# Patient Record
Sex: Male | Born: 1960 | Hispanic: No | Marital: Married | Smoking: Never smoker
Health system: Southern US, Community
[De-identification: ages and names within clinical notes are randomized; demographics above are authoritative.]

## PROBLEM LIST (undated history)

## (undated) DIAGNOSIS — H269 Unspecified cataract: Secondary | ICD-10-CM

## (undated) DIAGNOSIS — H35039 Hypertensive retinopathy, unspecified eye: Secondary | ICD-10-CM

## (undated) DIAGNOSIS — E11319 Type 2 diabetes mellitus with unspecified diabetic retinopathy without macular edema: Secondary | ICD-10-CM

## (undated) DIAGNOSIS — E119 Type 2 diabetes mellitus without complications: Secondary | ICD-10-CM

## (undated) DIAGNOSIS — I639 Cerebral infarction, unspecified: Secondary | ICD-10-CM

## (undated) HISTORY — DX: Type 2 diabetes mellitus with unspecified diabetic retinopathy without macular edema: E11.319

## (undated) HISTORY — DX: Hypertensive retinopathy, unspecified eye: H35.039

## (undated) HISTORY — DX: Cerebral infarction, unspecified: I63.9

## (undated) HISTORY — DX: Unspecified cataract: H26.9

---

## 2008-06-13 ENCOUNTER — Ambulatory Visit (HOSPITAL_COMMUNITY): Admission: RE | Admit: 2008-06-13 | Discharge: 2008-06-13 | Payer: Self-pay | Admitting: Family Medicine

## 2011-08-19 ENCOUNTER — Ambulatory Visit (HOSPITAL_COMMUNITY)
Admission: RE | Admit: 2011-08-19 | Discharge: 2011-08-19 | Disposition: A | Discharge status: Home / Self Care | Payer: 59 | Source: Ambulatory Visit | Attending: Family Medicine | Admitting: Family Medicine

## 2011-08-19 ENCOUNTER — Other Ambulatory Visit (HOSPITAL_COMMUNITY): Payer: Self-pay | Admitting: Family Medicine

## 2011-08-19 DIAGNOSIS — M25579 Pain in unspecified ankle and joints of unspecified foot: Secondary | ICD-10-CM | POA: Insufficient documentation

## 2011-08-19 DIAGNOSIS — M79673 Pain in unspecified foot: Secondary | ICD-10-CM

## 2011-08-19 DIAGNOSIS — M7730 Calcaneal spur, unspecified foot: Secondary | ICD-10-CM | POA: Insufficient documentation

## 2018-01-20 DIAGNOSIS — J069 Acute upper respiratory infection, unspecified: Secondary | ICD-10-CM | POA: Diagnosis not present

## 2018-02-26 DIAGNOSIS — L0292 Furuncle, unspecified: Secondary | ICD-10-CM | POA: Diagnosis not present

## 2018-02-26 DIAGNOSIS — Z6829 Body mass index (BMI) 29.0-29.9, adult: Secondary | ICD-10-CM | POA: Diagnosis not present

## 2018-07-12 DIAGNOSIS — B353 Tinea pedis: Secondary | ICD-10-CM | POA: Diagnosis not present

## 2018-07-12 DIAGNOSIS — M79672 Pain in left foot: Secondary | ICD-10-CM | POA: Diagnosis not present

## 2018-07-12 DIAGNOSIS — E1140 Type 2 diabetes mellitus with diabetic neuropathy, unspecified: Secondary | ICD-10-CM | POA: Diagnosis not present

## 2018-07-12 DIAGNOSIS — M79671 Pain in right foot: Secondary | ICD-10-CM | POA: Diagnosis not present

## 2018-08-07 DIAGNOSIS — I10 Essential (primary) hypertension: Secondary | ICD-10-CM | POA: Diagnosis not present

## 2018-08-07 DIAGNOSIS — R7301 Impaired fasting glucose: Secondary | ICD-10-CM | POA: Diagnosis not present

## 2018-08-10 DIAGNOSIS — E782 Mixed hyperlipidemia: Secondary | ICD-10-CM | POA: Diagnosis not present

## 2018-08-10 DIAGNOSIS — E1165 Type 2 diabetes mellitus with hyperglycemia: Secondary | ICD-10-CM | POA: Diagnosis not present

## 2018-08-10 DIAGNOSIS — I10 Essential (primary) hypertension: Secondary | ICD-10-CM | POA: Diagnosis not present

## 2018-08-10 DIAGNOSIS — J309 Allergic rhinitis, unspecified: Secondary | ICD-10-CM | POA: Diagnosis not present

## 2018-09-07 DIAGNOSIS — H9319 Tinnitus, unspecified ear: Secondary | ICD-10-CM | POA: Diagnosis not present

## 2018-09-07 DIAGNOSIS — R05 Cough: Secondary | ICD-10-CM | POA: Diagnosis not present

## 2018-09-07 DIAGNOSIS — R1013 Epigastric pain: Secondary | ICD-10-CM | POA: Diagnosis not present

## 2018-09-07 DIAGNOSIS — E1165 Type 2 diabetes mellitus with hyperglycemia: Secondary | ICD-10-CM | POA: Diagnosis not present

## 2018-09-25 DIAGNOSIS — H6593 Unspecified nonsuppurative otitis media, bilateral: Secondary | ICD-10-CM | POA: Diagnosis not present

## 2018-09-25 DIAGNOSIS — G720 Drug-induced myopathy: Secondary | ICD-10-CM | POA: Diagnosis not present

## 2018-09-25 DIAGNOSIS — J309 Allergic rhinitis, unspecified: Secondary | ICD-10-CM | POA: Diagnosis not present

## 2018-09-25 DIAGNOSIS — Z6831 Body mass index (BMI) 31.0-31.9, adult: Secondary | ICD-10-CM | POA: Diagnosis not present

## 2018-10-03 DIAGNOSIS — M79671 Pain in right foot: Secondary | ICD-10-CM | POA: Diagnosis not present

## 2018-10-03 DIAGNOSIS — B351 Tinea unguium: Secondary | ICD-10-CM | POA: Diagnosis not present

## 2018-10-03 DIAGNOSIS — M79672 Pain in left foot: Secondary | ICD-10-CM | POA: Diagnosis not present

## 2018-10-20 DIAGNOSIS — R1013 Epigastric pain: Secondary | ICD-10-CM | POA: Diagnosis not present

## 2018-10-20 DIAGNOSIS — J069 Acute upper respiratory infection, unspecified: Secondary | ICD-10-CM | POA: Diagnosis not present

## 2018-10-20 DIAGNOSIS — L0292 Furuncle, unspecified: Secondary | ICD-10-CM | POA: Diagnosis not present

## 2018-10-20 DIAGNOSIS — J060 Acute laryngopharyngitis: Secondary | ICD-10-CM | POA: Diagnosis not present

## 2018-10-20 DIAGNOSIS — Z6829 Body mass index (BMI) 29.0-29.9, adult: Secondary | ICD-10-CM | POA: Diagnosis not present

## 2018-12-12 DIAGNOSIS — M79671 Pain in right foot: Secondary | ICD-10-CM | POA: Diagnosis not present

## 2018-12-12 DIAGNOSIS — M79672 Pain in left foot: Secondary | ICD-10-CM | POA: Diagnosis not present

## 2018-12-12 DIAGNOSIS — E1140 Type 2 diabetes mellitus with diabetic neuropathy, unspecified: Secondary | ICD-10-CM | POA: Diagnosis not present

## 2019-02-05 DIAGNOSIS — E119 Type 2 diabetes mellitus without complications: Secondary | ICD-10-CM | POA: Diagnosis not present

## 2019-02-05 DIAGNOSIS — H5202 Hypermetropia, left eye: Secondary | ICD-10-CM | POA: Diagnosis not present

## 2019-02-05 DIAGNOSIS — E113293 Type 2 diabetes mellitus with mild nonproliferative diabetic retinopathy without macular edema, bilateral: Secondary | ICD-10-CM | POA: Diagnosis not present

## 2019-02-05 DIAGNOSIS — H524 Presbyopia: Secondary | ICD-10-CM | POA: Diagnosis not present

## 2019-02-11 NOTE — Progress Notes (Addendum)
[facility] - Clinic Note  02/13/2019     CHIEF COMPLAINT Patient presents for Diabetic Eye Exam   HISTORY OF PRESENT ILLNESS: Brad Delacruz is a 58 y.o. male who presents to the clinic today for:   HPI    Diabetic Eye Exam    Vision is stable.  Associated Symptoms Floaters.  Negative for Flashes, Blind Spot, Photophobia, Scalp Tenderness, Fever, Pain, Glare, Jaw Claudication, Weight Loss, Distortion, Redness, Trauma, Shoulder/Hip pain and Fatigue.  Diabetes characteristics include Type 2.  This started 17.  Blood sugar level is uncontrolled.  Last Blood Glucose: Doesn't check.  Last A1C 9 (6 months ago).  Associated Diagnosis Neuropathy (Numbness in feet).  I, the attending physician,  performed the HPI with the patient and updated documentation appropriately.          Comments    Patient went to eye doctor for routine follow up and referred here for diabetic eye exam. Last A1c that patient remembers was around 9 or over. This was checked about 6 months ago. Patient does not check blood sugars at home.        Last edited by Rennis Chris, MD on 02/13/2019  9:35 AM. (History)    pt states he was sent here by Dr. Charise Killian for DM exam who he saw for routine eye exam, pt states he does not have any problems with his vision, he states he only wears reading glasses, pt states he takes metformin and farxiga for diabetes, he states his last A1C was over 9, and he does not check his blood sugar at home, pt states he also takes medication for high BP, pt denies any other health problems  Referring physician: Daisy Lazar, DO [address] [place], [place] [address]  HISTORICAL INFORMATION:   Selected notes from the MEDICAL RECORD NUMBER Referred by Dr. Daisy Lazar for concern of NPDR OU LEE: 03.03.20 Fabienne Bruns) [BCVA: OU: 20/20-] Ocular Hx-NPDR OU PMH-DM (takes Farxiga/metformin)    CURRENT MEDICATIONS: No current outpatient medications on file. (Ophthalmic Drugs)    No current facility-administered medications for this visit.  (Ophthalmic Drugs)   Current Outpatient Medications (Other)  Medication Sig  . ciclopirox (PENLAC) 8 % solution Apply topically as directed.  . dapagliflozin propanediol (FARXIGA) 10 MG TABS tablet Take 10 mg by mouth daily.  Marland Kitchen losartan (COZAAR) 25 MG tablet Take 25 mg by mouth daily.  . pravastatin (PRAVACHOL) 10 MG tablet Take 10 mg by mouth daily.  Marland Kitchen XIGDUO XR 09-999 MG TB24 Take 1 tablet by mouth daily.   No current facility-administered medications for this visit.  (Other)      REVIEW OF SYSTEMS: ROS    Positive for: Musculoskeletal, Endocrine, Eyes   Negative for: Constitutional, Gastrointestinal, Neurological, Skin, Genitourinary, HENT, Cardiovascular, Respiratory, Psychiatric, Allergic/Imm, Heme/Lymph   Last edited by Annalee Genta D on 02/13/2019  8:48 AM. (History)       ALLERGIES No Known Allergies  PAST MEDICAL HISTORY History reviewed. No pertinent past medical history. History reviewed. No pertinent surgical history.  FAMILY HISTORY History reviewed. No pertinent family history.  SOCIAL HISTORY Social History   Tobacco Use  . Smoking status: Never Smoker  . Smokeless tobacco: Never Used  Substance Use Topics  . Alcohol use: Not Currently    Frequency: Never  . Drug use: Never         OPHTHALMIC EXAM:  Base Eye Exam    Visual Acuity (Snellen - Linear)  Right Left   Dist [place] 20/20 20/20       Tonometry (Tonopen, 9:02 AM)      Right Left   Pressure 22 20       Pupils      Dark Light Shape React APD   Right 4 3 Round Sluggish None   Left 4 3 Round Sluggish None       Visual Fields (Counting fingers)      Left Right    Full Full       Extraocular Movement      Right Left    Full, Ortho Full, Ortho       Neuro/Psych    Oriented x3:  Yes   Mood/Affect:  Normal       Dilation    Both eyes:  1.0% Mydriacyl, 2.5% Phenylephrine @ 9:02 AM        Slit Lamp and  Fundus Exam    Slit Lamp Exam      Right Left   Lids/Lashes Dermatochalasis - upper lid Dermatochalasis - upper lid, mild Meibomian gland dysfunction   Conjunctiva/Sclera White and quiet mild nasal and temporal Pinguecula   Cornea 1+ Punctate epithelial erosions, Debris in tear film 1+ Punctate epithelial erosions, Debris in tear film   Anterior Chamber deep, narrow temporal angle deep and clear   Iris Round and well dilated, No NVI Round and well dilated, No NVI   Lens 2+ Nuclear sclerosis, 2+ Cortical cataract 2+ Nuclear sclerosis, 2+ Cortical cataract   Vitreous Vitreous syneresis mild Vitreous syneresis       Fundus Exam      Right Left   Disc Pink and Sharp Pink and Sharp   C/D Ratio 0.5 0.55   Macula Blunted foveal reflex, scattered IRH, small cluster of exudate inferior fovea, CWS inferior macular, cystic changes Blunted foveal reflex, scattered MA/IRF mostly superior macula, Cystic changes SN to fovea   Vessels Vascular attenuation, mild AV crossing changes Vascular attenuation, mild AV crossing changes   Periphery Attached, scattered MA Attached, rare MA        Refraction    Manifest Refraction      Sphere Cylinder Axis Dist VA   Right -0.25 Sphere  20/20-1   Left +0.00 +0.75 167 20/20          IMAGING AND PROCEDURES  Imaging and Procedures for @TODAY @  OCT, Retina - OU - Both Eyes       Right Eye Quality was good. Central Foveal Thickness: 327. Progression has no prior data. Findings include normal foveal contour, no SRF, intraretinal fluid, intraretinal hyper-reflective material.   Left Eye Quality was good. Central Foveal Thickness: 325. Progression has no prior data. Findings include normal foveal contour, no SRF, intraretinal fluid, intraretinal hyper-reflective material.   Notes *Images captured and stored on drive  Diagnosis / Impression:  OD: NFP; mild cystic changes and  IRHM OS: NFP: +IRF/DME sup nasal macula  Clinical management:  See  below  Abbreviations: NFP - Normal foveal profile. CME - cystoid macular edema. PED - pigment epithelial detachment. IRF - intraretinal fluid. SRF - subretinal fluid. EZ - ellipsoid zone. ERM - epiretinal membrane. ORA - outer retinal atrophy. ORT - outer retinal tubulation. SRHM - subretinal hyper-reflective material                 ASSESSMENT/PLAN:    ICD-10-CM   1. Moderate nonproliferative diabetic retinopathy of both eyes with macular edema associated with type 2 diabetes mellitus (HCC) S31.5945  2. Retinal edema H35.81 OCT, Retina - OU - Both Eyes  3. Essential hypertension I10   4. Hypertensive retinopathy of both eyes H35.033   5. Combined forms of age-related cataract of both eyes H25.813     1,2. Moderate nonproliferative diabetic retinopathy with DME OU  - The incidence, risk factors for progression, natural history and treatment options for diabetic retinopathy were discussed with patient.    - The need for close monitoring of blood glucose, blood pressure, and serum lipids, avoiding cigarette or any type of tobacco, and the need for long term follow up was also discussed with patient.  - BCVA 20/20 OU  - exam shows scattered IRH  - OCT with noncentral diabetic macular edema, OU   - f/u in 6 weeks, DFE/OCT/Optos FA  3,4. Hypertensive retinopathy OU  - discussed importance of tight BP control  - monitor  5. Mixed form age related cataract  - The symptoms of cataract, surgical options, and treatments and risks were discussed with patient.  - discussed diagnosis and progression  - not yet visually significant  - monitor for now   Ophthalmic Meds Ordered this visit:  No orders of the defined types were placed in this encounter.      Return in about 6 weeks (around 03/27/2019) for f/u NPDR OU, DFE, OCT, FA.  There are no Patient Instructions on file for this visit.   Explained the diagnoses, plan, and follow up with the patient and they expressed  understanding.  Patient expressed understanding of the importance of proper follow up care.   This document serves as a record of services personally performed by Karie Chimera, MD, PhD. It was created on their behalf by Laurian Brim, OA, an ophthalmic assistant. The creation of this record is the provider's dictation and/or activities during the visit.    Electronically signed by: Laurian Brim, OA  03.09.2020 10:54 AM    Karie Chimera, M.D., Ph.D. Diseases & Surgery of the Retina and Vitreous [facility]   I have reviewed the above documentation for accuracy and completeness, and I agree with the above. Karie Chimera, M.D., Ph.D. 02/16/19 10:54 AM    Abbreviations: M myopia (nearsighted); A astigmatism; H hyperopia (farsighted); P presbyopia; Mrx spectacle prescription;  CTL contact lenses; OD right eye; OS left eye; OU both eyes  XT exotropia; ET esotropia; PEK punctate epithelial keratitis; PEE punctate epithelial erosions; DES dry eye syndrome; MGD meibomian gland dysfunction; ATs artificial tears; PFAT's preservative free artificial tears; NSC nuclear sclerotic cataract; PSC posterior subcapsular cataract; ERM epi-retinal membrane; PVD posterior vitreous detachment; RD retinal detachment; DM diabetes mellitus; DR diabetic retinopathy; NPDR non-proliferative diabetic retinopathy; PDR proliferative diabetic retinopathy; CSME clinically significant macular edema; DME diabetic macular edema; dbh dot blot hemorrhages; CWS cotton wool spot; POAG primary open angle glaucoma; C/D cup-to-disc ratio; HVF humphrey visual field; GVF goldmann visual field; OCT optical coherence tomography; IOP intraocular pressure; BRVO Branch retinal vein occlusion; CRVO central retinal vein occlusion; CRAO central retinal artery occlusion; BRAO branch retinal artery occlusion; RT retinal tear; SB scleral buckle; PPV pars plana vitrectomy; VH Vitreous hemorrhage; PRP panretinal laser  photocoagulation; IVK intravitreal kenalog; VMT vitreomacular traction; MH Macular hole;  NVD neovascularization of the disc; NVE neovascularization elsewhere; AREDS age related eye disease study; ARMD age related macular degeneration; POAG primary open angle glaucoma; EBMD epithelial/anterior basement membrane dystrophy; ACIOL anterior chamber intraocular lens; IOL intraocular lens; PCIOL posterior chamber intraocular lens; Phaco/IOL phacoemulsification with intraocular  lens placement; [place] photorefractive keratectomy; LASIK laser assisted in situ keratomileusis; HTN hypertension; DM diabetes mellitus; COPD chronic obstructive pulmonary disease

## 2019-02-13 ENCOUNTER — Ambulatory Visit (INDEPENDENT_AMBULATORY_CARE_PROVIDER_SITE_OTHER): Payer: BLUE CROSS/BLUE SHIELD | Admitting: Ophthalmology

## 2019-02-13 ENCOUNTER — Other Ambulatory Visit: Payer: Self-pay

## 2019-02-13 ENCOUNTER — Encounter (INDEPENDENT_AMBULATORY_CARE_PROVIDER_SITE_OTHER): Payer: Self-pay | Admitting: Ophthalmology

## 2019-02-13 DIAGNOSIS — E113313 Type 2 diabetes mellitus with moderate nonproliferative diabetic retinopathy with macular edema, bilateral: Secondary | ICD-10-CM

## 2019-02-13 DIAGNOSIS — H35033 Hypertensive retinopathy, bilateral: Secondary | ICD-10-CM | POA: Diagnosis not present

## 2019-02-13 DIAGNOSIS — I10 Essential (primary) hypertension: Secondary | ICD-10-CM

## 2019-02-13 DIAGNOSIS — H25813 Combined forms of age-related cataract, bilateral: Secondary | ICD-10-CM

## 2019-02-13 DIAGNOSIS — H3581 Retinal edema: Secondary | ICD-10-CM | POA: Diagnosis not present

## 2019-03-27 ENCOUNTER — Encounter (INDEPENDENT_AMBULATORY_CARE_PROVIDER_SITE_OTHER): Payer: BLUE CROSS/BLUE SHIELD | Admitting: Ophthalmology

## 2019-10-16 DIAGNOSIS — Z6829 Body mass index (BMI) 29.0-29.9, adult: Secondary | ICD-10-CM | POA: Diagnosis not present

## 2019-10-16 DIAGNOSIS — J060 Acute laryngopharyngitis: Secondary | ICD-10-CM | POA: Diagnosis not present

## 2019-10-16 DIAGNOSIS — E782 Mixed hyperlipidemia: Secondary | ICD-10-CM | POA: Diagnosis not present

## 2019-10-16 DIAGNOSIS — E1165 Type 2 diabetes mellitus with hyperglycemia: Secondary | ICD-10-CM | POA: Diagnosis not present

## 2019-10-16 DIAGNOSIS — J309 Allergic rhinitis, unspecified: Secondary | ICD-10-CM | POA: Diagnosis not present

## 2019-10-16 DIAGNOSIS — L0292 Furuncle, unspecified: Secondary | ICD-10-CM | POA: Diagnosis not present

## 2019-10-16 DIAGNOSIS — I10 Essential (primary) hypertension: Secondary | ICD-10-CM | POA: Diagnosis not present

## 2019-10-16 DIAGNOSIS — J069 Acute upper respiratory infection, unspecified: Secondary | ICD-10-CM | POA: Diagnosis not present

## 2019-11-13 DIAGNOSIS — I10 Essential (primary) hypertension: Secondary | ICD-10-CM | POA: Diagnosis not present

## 2019-11-13 DIAGNOSIS — E1165 Type 2 diabetes mellitus with hyperglycemia: Secondary | ICD-10-CM | POA: Diagnosis not present

## 2019-11-13 DIAGNOSIS — E782 Mixed hyperlipidemia: Secondary | ICD-10-CM | POA: Diagnosis not present

## 2019-11-13 DIAGNOSIS — Z9114 Patient's other noncompliance with medication regimen: Secondary | ICD-10-CM | POA: Diagnosis not present

## 2020-01-29 DIAGNOSIS — J069 Acute upper respiratory infection, unspecified: Secondary | ICD-10-CM | POA: Diagnosis not present

## 2020-01-29 DIAGNOSIS — E1165 Type 2 diabetes mellitus with hyperglycemia: Secondary | ICD-10-CM | POA: Diagnosis not present

## 2020-01-29 DIAGNOSIS — Z6829 Body mass index (BMI) 29.0-29.9, adult: Secondary | ICD-10-CM | POA: Diagnosis not present

## 2020-01-29 DIAGNOSIS — J060 Acute laryngopharyngitis: Secondary | ICD-10-CM | POA: Diagnosis not present

## 2020-01-29 DIAGNOSIS — L0292 Furuncle, unspecified: Secondary | ICD-10-CM | POA: Diagnosis not present

## 2020-02-03 DIAGNOSIS — J309 Allergic rhinitis, unspecified: Secondary | ICD-10-CM | POA: Diagnosis not present

## 2020-02-03 DIAGNOSIS — E1165 Type 2 diabetes mellitus with hyperglycemia: Secondary | ICD-10-CM | POA: Diagnosis not present

## 2020-02-03 DIAGNOSIS — I10 Essential (primary) hypertension: Secondary | ICD-10-CM | POA: Diagnosis not present

## 2020-02-03 DIAGNOSIS — E782 Mixed hyperlipidemia: Secondary | ICD-10-CM | POA: Diagnosis not present

## 2020-03-24 ENCOUNTER — Emergency Department (HOSPITAL_COMMUNITY): Payer: BC Managed Care – PPO

## 2020-03-24 ENCOUNTER — Inpatient Hospital Stay (HOSPITAL_COMMUNITY)
Admission: EM | Admit: 2020-03-24 | Discharge: 2020-04-16 | DRG: 023 | Disposition: A | Discharge status: Rehabilitation Facility | Payer: BC Managed Care – PPO | Attending: Neurology | Admitting: Neurology

## 2020-03-24 ENCOUNTER — Inpatient Hospital Stay (HOSPITAL_COMMUNITY): Payer: BC Managed Care – PPO

## 2020-03-24 ENCOUNTER — Other Ambulatory Visit: Payer: Self-pay

## 2020-03-24 ENCOUNTER — Encounter (HOSPITAL_COMMUNITY)
Admission: EM | Disposition: A | Discharge status: Rehabilitation Facility | Payer: Self-pay | Source: Home / Self Care | Attending: Neurology

## 2020-03-24 ENCOUNTER — Emergency Department (HOSPITAL_COMMUNITY): Payer: BC Managed Care – PPO | Admitting: Registered Nurse

## 2020-03-24 ENCOUNTER — Encounter (HOSPITAL_COMMUNITY): Payer: Self-pay | Admitting: Emergency Medicine

## 2020-03-24 DIAGNOSIS — Z7982 Long term (current) use of aspirin: Secondary | ICD-10-CM | POA: Diagnosis not present

## 2020-03-24 DIAGNOSIS — J189 Pneumonia, unspecified organism: Secondary | ICD-10-CM | POA: Diagnosis not present

## 2020-03-24 DIAGNOSIS — N179 Acute kidney failure, unspecified: Secondary | ICD-10-CM | POA: Diagnosis not present

## 2020-03-24 DIAGNOSIS — N390 Urinary tract infection, site not specified: Secondary | ICD-10-CM

## 2020-03-24 DIAGNOSIS — G8191 Hemiplegia, unspecified affecting right dominant side: Secondary | ICD-10-CM | POA: Diagnosis present

## 2020-03-24 DIAGNOSIS — I639 Cerebral infarction, unspecified: Secondary | ICD-10-CM | POA: Diagnosis present

## 2020-03-24 DIAGNOSIS — I6389 Other cerebral infarction: Secondary | ICD-10-CM | POA: Diagnosis not present

## 2020-03-24 DIAGNOSIS — I161 Hypertensive emergency: Secondary | ICD-10-CM | POA: Diagnosis not present

## 2020-03-24 DIAGNOSIS — E1165 Type 2 diabetes mellitus with hyperglycemia: Secondary | ICD-10-CM | POA: Diagnosis present

## 2020-03-24 DIAGNOSIS — Y900 Blood alcohol level of less than 20 mg/100 ml: Secondary | ICD-10-CM | POA: Diagnosis present

## 2020-03-24 DIAGNOSIS — E870 Hyperosmolality and hypernatremia: Secondary | ICD-10-CM | POA: Diagnosis not present

## 2020-03-24 DIAGNOSIS — Z6829 Body mass index (BMI) 29.0-29.9, adult: Secondary | ICD-10-CM

## 2020-03-24 DIAGNOSIS — Z9282 Status post administration of tPA (rtPA) in a different facility within the last 24 hours prior to admission to current facility: Secondary | ICD-10-CM | POA: Diagnosis not present

## 2020-03-24 DIAGNOSIS — Z781 Physical restraint status: Secondary | ICD-10-CM

## 2020-03-24 DIAGNOSIS — J95821 Acute postprocedural respiratory failure: Secondary | ICD-10-CM | POA: Diagnosis not present

## 2020-03-24 DIAGNOSIS — Z79899 Other long term (current) drug therapy: Secondary | ICD-10-CM

## 2020-03-24 DIAGNOSIS — T83511S Infection and inflammatory reaction due to indwelling urethral catheter, sequela: Secondary | ICD-10-CM | POA: Diagnosis not present

## 2020-03-24 DIAGNOSIS — I63512 Cerebral infarction due to unspecified occlusion or stenosis of left middle cerebral artery: Secondary | ICD-10-CM | POA: Diagnosis not present

## 2020-03-24 DIAGNOSIS — I69391 Dysphagia following cerebral infarction: Secondary | ICD-10-CM | POA: Diagnosis not present

## 2020-03-24 DIAGNOSIS — E871 Hypo-osmolality and hyponatremia: Secondary | ICD-10-CM | POA: Diagnosis not present

## 2020-03-24 DIAGNOSIS — R339 Retention of urine, unspecified: Secondary | ICD-10-CM | POA: Diagnosis not present

## 2020-03-24 DIAGNOSIS — Z978 Presence of other specified devices: Secondary | ICD-10-CM

## 2020-03-24 DIAGNOSIS — I69320 Aphasia following cerebral infarction: Secondary | ICD-10-CM | POA: Diagnosis not present

## 2020-03-24 DIAGNOSIS — J9690 Respiratory failure, unspecified, unspecified whether with hypoxia or hypercapnia: Secondary | ICD-10-CM

## 2020-03-24 DIAGNOSIS — R0902 Hypoxemia: Secondary | ICD-10-CM | POA: Diagnosis not present

## 2020-03-24 DIAGNOSIS — Z794 Long term (current) use of insulin: Secondary | ICD-10-CM | POA: Diagnosis not present

## 2020-03-24 DIAGNOSIS — R4701 Aphasia: Secondary | ICD-10-CM | POA: Diagnosis present

## 2020-03-24 DIAGNOSIS — E46 Unspecified protein-calorie malnutrition: Secondary | ICD-10-CM | POA: Diagnosis not present

## 2020-03-24 DIAGNOSIS — J690 Pneumonitis due to inhalation of food and vomit: Secondary | ICD-10-CM | POA: Diagnosis not present

## 2020-03-24 DIAGNOSIS — R270 Ataxia, unspecified: Secondary | ICD-10-CM | POA: Diagnosis present

## 2020-03-24 DIAGNOSIS — R471 Dysarthria and anarthria: Secondary | ICD-10-CM | POA: Diagnosis present

## 2020-03-24 DIAGNOSIS — E781 Pure hyperglyceridemia: Secondary | ICD-10-CM | POA: Diagnosis not present

## 2020-03-24 DIAGNOSIS — D72829 Elevated white blood cell count, unspecified: Secondary | ICD-10-CM

## 2020-03-24 DIAGNOSIS — E876 Hypokalemia: Secondary | ICD-10-CM | POA: Diagnosis not present

## 2020-03-24 DIAGNOSIS — Y990 Civilian activity done for income or pay: Secondary | ICD-10-CM | POA: Diagnosis not present

## 2020-03-24 DIAGNOSIS — R197 Diarrhea, unspecified: Secondary | ICD-10-CM | POA: Diagnosis not present

## 2020-03-24 DIAGNOSIS — G460 Middle cerebral artery syndrome: Secondary | ICD-10-CM | POA: Diagnosis present

## 2020-03-24 DIAGNOSIS — F10239 Alcohol dependence with withdrawal, unspecified: Secondary | ICD-10-CM | POA: Diagnosis present

## 2020-03-24 DIAGNOSIS — E785 Hyperlipidemia, unspecified: Secondary | ICD-10-CM | POA: Diagnosis not present

## 2020-03-24 DIAGNOSIS — Z8673 Personal history of transient ischemic attack (TIA), and cerebral infarction without residual deficits: Secondary | ICD-10-CM

## 2020-03-24 DIAGNOSIS — R109 Unspecified abdominal pain: Secondary | ICD-10-CM

## 2020-03-24 DIAGNOSIS — R1310 Dysphagia, unspecified: Secondary | ICD-10-CM

## 2020-03-24 DIAGNOSIS — Z9911 Dependence on respirator [ventilator] status: Secondary | ICD-10-CM

## 2020-03-24 DIAGNOSIS — Y95 Nosocomial condition: Secondary | ICD-10-CM | POA: Diagnosis not present

## 2020-03-24 DIAGNOSIS — I69392 Facial weakness following cerebral infarction: Secondary | ICD-10-CM | POA: Diagnosis not present

## 2020-03-24 DIAGNOSIS — J9602 Acute respiratory failure with hypercapnia: Secondary | ICD-10-CM | POA: Diagnosis not present

## 2020-03-24 DIAGNOSIS — W19XXXA Unspecified fall, initial encounter: Secondary | ICD-10-CM | POA: Diagnosis present

## 2020-03-24 DIAGNOSIS — J9600 Acute respiratory failure, unspecified whether with hypoxia or hypercapnia: Secondary | ICD-10-CM | POA: Diagnosis not present

## 2020-03-24 DIAGNOSIS — M25512 Pain in left shoulder: Secondary | ICD-10-CM | POA: Diagnosis not present

## 2020-03-24 DIAGNOSIS — I6602 Occlusion and stenosis of left middle cerebral artery: Secondary | ICD-10-CM | POA: Diagnosis not present

## 2020-03-24 DIAGNOSIS — I672 Cerebral atherosclerosis: Secondary | ICD-10-CM | POA: Diagnosis present

## 2020-03-24 DIAGNOSIS — R1312 Dysphagia, oropharyngeal phase: Secondary | ICD-10-CM | POA: Diagnosis not present

## 2020-03-24 DIAGNOSIS — Z0189 Encounter for other specified special examinations: Secondary | ICD-10-CM

## 2020-03-24 DIAGNOSIS — J9601 Acute respiratory failure with hypoxia: Secondary | ICD-10-CM | POA: Diagnosis not present

## 2020-03-24 DIAGNOSIS — Z8781 Personal history of (healed) traumatic fracture: Secondary | ICD-10-CM

## 2020-03-24 DIAGNOSIS — I63412 Cerebral infarction due to embolism of left middle cerebral artery: Principal | ICD-10-CM | POA: Diagnosis present

## 2020-03-24 DIAGNOSIS — IMO0002 Reserved for concepts with insufficient information to code with codable children: Secondary | ICD-10-CM | POA: Diagnosis present

## 2020-03-24 DIAGNOSIS — I10 Essential (primary) hypertension: Secondary | ICD-10-CM | POA: Diagnosis present

## 2020-03-24 DIAGNOSIS — I609 Nontraumatic subarachnoid hemorrhage, unspecified: Secondary | ICD-10-CM | POA: Diagnosis not present

## 2020-03-24 DIAGNOSIS — R414 Neurologic neglect syndrome: Secondary | ICD-10-CM | POA: Diagnosis not present

## 2020-03-24 DIAGNOSIS — R509 Fever, unspecified: Secondary | ICD-10-CM

## 2020-03-24 DIAGNOSIS — E669 Obesity, unspecified: Secondary | ICD-10-CM | POA: Diagnosis not present

## 2020-03-24 DIAGNOSIS — Z9114 Patient's other noncompliance with medication regimen: Secondary | ICD-10-CM

## 2020-03-24 DIAGNOSIS — I959 Hypotension, unspecified: Secondary | ICD-10-CM | POA: Diagnosis not present

## 2020-03-24 DIAGNOSIS — R29728 NIHSS score 28: Secondary | ICD-10-CM | POA: Diagnosis present

## 2020-03-24 DIAGNOSIS — M7541 Impingement syndrome of right shoulder: Secondary | ICD-10-CM | POA: Diagnosis not present

## 2020-03-24 DIAGNOSIS — Z20822 Contact with and (suspected) exposure to covid-19: Secondary | ICD-10-CM | POA: Diagnosis not present

## 2020-03-24 DIAGNOSIS — E7800 Pure hypercholesterolemia, unspecified: Secondary | ICD-10-CM | POA: Diagnosis not present

## 2020-03-24 DIAGNOSIS — Z01818 Encounter for other preprocedural examination: Secondary | ICD-10-CM

## 2020-03-24 DIAGNOSIS — D696 Thrombocytopenia, unspecified: Secondary | ICD-10-CM | POA: Diagnosis present

## 2020-03-24 DIAGNOSIS — I69398 Other sequelae of cerebral infarction: Secondary | ICD-10-CM | POA: Diagnosis not present

## 2020-03-24 DIAGNOSIS — G936 Cerebral edema: Secondary | ICD-10-CM | POA: Diagnosis present

## 2020-03-24 DIAGNOSIS — E119 Type 2 diabetes mellitus without complications: Secondary | ICD-10-CM | POA: Diagnosis not present

## 2020-03-24 DIAGNOSIS — I700 Atherosclerosis of aorta: Secondary | ICD-10-CM | POA: Diagnosis present

## 2020-03-24 DIAGNOSIS — Q211 Atrial septal defect: Secondary | ICD-10-CM | POA: Diagnosis not present

## 2020-03-24 DIAGNOSIS — R451 Restlessness and agitation: Secondary | ICD-10-CM | POA: Diagnosis not present

## 2020-03-24 DIAGNOSIS — R29810 Facial weakness: Secondary | ICD-10-CM | POA: Diagnosis present

## 2020-03-24 DIAGNOSIS — I69351 Hemiplegia and hemiparesis following cerebral infarction affecting right dominant side: Secondary | ICD-10-CM | POA: Diagnosis not present

## 2020-03-24 DIAGNOSIS — E782 Mixed hyperlipidemia: Secondary | ICD-10-CM | POA: Diagnosis not present

## 2020-03-24 DIAGNOSIS — E663 Overweight: Secondary | ICD-10-CM | POA: Diagnosis present

## 2020-03-24 HISTORY — PX: RADIOLOGY WITH ANESTHESIA: SHX6223

## 2020-03-24 HISTORY — PX: IR INTRA CRAN STENT: IMG2345

## 2020-03-24 HISTORY — DX: Type 2 diabetes mellitus without complications: E11.9

## 2020-03-24 HISTORY — PX: IR CT HEAD LTD: IMG2386

## 2020-03-24 HISTORY — PX: IR PERCUTANEOUS ART THROMBECTOMY/INFUSION INTRACRANIAL INC DIAG ANGIO: IMG6087

## 2020-03-24 LAB — COMPREHENSIVE METABOLIC PANEL
ALT: 27 U/L (ref 0–44)
AST: 21 U/L (ref 15–41)
Albumin: 3.8 g/dL (ref 3.5–5.0)
Alkaline Phosphatase: 90 U/L (ref 38–126)
Anion gap: 10 (ref 5–15)
BUN: 20 mg/dL (ref 6–20)
CO2: 26 mmol/L (ref 22–32)
Calcium: 8.9 mg/dL (ref 8.9–10.3)
Chloride: 94 mmol/L — ABNORMAL LOW (ref 98–111)
Creatinine, Ser: 0.94 mg/dL (ref 0.61–1.24)
GFR calc Af Amer: >60 mL/min (ref >60)
GFR calc non Af Amer: >60 mL/min (ref >60)
Glucose, Bld: 309 mg/dL — ABNORMAL HIGH (ref 70–99)
Potassium: 3.3 mmol/L — ABNORMAL LOW (ref 3.5–5.1)
Sodium: 130 mmol/L — ABNORMAL LOW (ref 135–145)
Total Bilirubin: 0.7 mg/dL (ref 0.3–1.2)
Total Protein: 6.5 g/dL (ref 6.5–8.1)

## 2020-03-24 LAB — PROTIME-INR
INR: 1 (ref 0.8–1.2)
Prothrombin Time: 12.8 seconds (ref 11.4–15.2)

## 2020-03-24 LAB — CBC
HCT: 40.4 % (ref 39.0–52.0)
Hemoglobin: 14.6 g/dL (ref 13.0–17.0)
MCH: 31.9 pg (ref 26.0–34.0)
MCHC: 36.1 g/dL — ABNORMAL HIGH (ref 30.0–36.0)
MCV: 88.4 fL (ref 80.0–100.0)
Platelets: 120 10*3/uL — ABNORMAL LOW (ref 150–400)
RBC: 4.57 MIL/uL (ref 4.22–5.81)
RDW: 12.4 % (ref 11.5–15.5)
WBC: 5.4 10*3/uL (ref 4.0–10.5)
nRBC: 0 % (ref 0.0–0.2)

## 2020-03-24 LAB — HEMOGLOBIN A1C
Hgb A1c MFr Bld: 9.2 % — ABNORMAL HIGH (ref 4.8–5.6)
Mean Plasma Glucose: 217.34 mg/dL

## 2020-03-24 LAB — GLUCOSE, CAPILLARY
Glucose-Capillary: 248 mg/dL — ABNORMAL HIGH (ref 70–99)
Glucose-Capillary: 291 mg/dL — ABNORMAL HIGH (ref 70–99)
Glucose-Capillary: 194 mg/dL — ABNORMAL HIGH (ref 70–99)

## 2020-03-24 LAB — POCT I-STAT 7, (LYTES, BLD GAS, ICA,H+H)
Acid-base deficit: 3 mmol/L — ABNORMAL HIGH (ref 0.0–2.0)
Bicarbonate: 22.4 mmol/L (ref 20.0–28.0)
Calcium, Ion: 1.17 mmol/L (ref 1.15–1.40)
HCT: 35 % — ABNORMAL LOW (ref 39.0–52.0)
Hemoglobin: 11.9 g/dL — ABNORMAL LOW (ref 13.0–17.0)
O2 Saturation: 100 %
Potassium: 4.1 mmol/L (ref 3.5–5.1)
Sodium: 136 mmol/L (ref 135–145)
TCO2: 24 mmol/L (ref 22–32)
pCO2 arterial: 39.5 mmHg (ref 32.0–48.0)
pH, Arterial: 7.361 (ref 7.350–7.450)
pO2, Arterial: 257 mmHg — ABNORMAL HIGH (ref 83.0–108.0)

## 2020-03-24 LAB — I-STAT CHEM 8, ED
BUN: 20 mg/dL (ref 6–20)
Calcium, Ion: 1.21 mmol/L (ref 1.15–1.40)
Chloride: 96 mmol/L — ABNORMAL LOW (ref 98–111)
Creatinine, Ser: 0.9 mg/dL (ref 0.61–1.24)
Glucose, Bld: 293 mg/dL — ABNORMAL HIGH (ref 70–99)
HCT: 41 % (ref 39.0–52.0)
Hemoglobin: 13.9 g/dL (ref 13.0–17.0)
Potassium: 3.3 mmol/L — ABNORMAL LOW (ref 3.5–5.1)
Sodium: 133 mmol/L — ABNORMAL LOW (ref 135–145)
TCO2: 27 mmol/L (ref 22–32)

## 2020-03-24 LAB — RESPIRATORY PANEL BY RT PCR (FLU A&B, COVID)
Influenza A by PCR: NEGATIVE
Influenza B by PCR: NEGATIVE
SARS Coronavirus 2 by RT PCR: NEGATIVE

## 2020-03-24 LAB — DIFFERENTIAL
Abs Immature Granulocytes: 0.03 10*3/uL (ref 0.00–0.07)
Basophils Absolute: 0.1 10*3/uL (ref 0.0–0.1)
Basophils Relative: 1 %
Eosinophils Absolute: 0.1 10*3/uL (ref 0.0–0.5)
Eosinophils Relative: 2 %
Immature Granulocytes: 1 %
Lymphocytes Relative: 31 %
Lymphs Abs: 1.7 10*3/uL (ref 0.7–4.0)
Monocytes Absolute: 0.6 10*3/uL (ref 0.1–1.0)
Monocytes Relative: 11 %
Neutro Abs: 3 10*3/uL (ref 1.7–7.7)
Neutrophils Relative %: 54 %

## 2020-03-24 LAB — ETHANOL: Alcohol, Ethyl (B): <10 mg/dL (ref <10)

## 2020-03-24 LAB — ECHOCARDIOGRAM COMPLETE
Height: 65 in
Weight: 3030 oz

## 2020-03-24 LAB — CBG MONITORING, ED: Glucose-Capillary: 288 mg/dL — ABNORMAL HIGH (ref 70–99)

## 2020-03-24 LAB — APTT: aPTT: 24 seconds (ref 24–36)

## 2020-03-24 LAB — MRSA PCR SCREENING: MRSA by PCR: NEGATIVE

## 2020-03-24 SURGERY — IR WITH ANESTHESIA
Anesthesia: General

## 2020-03-24 MED ORDER — SENNOSIDES-DOCUSATE SODIUM 8.6-50 MG PO TABS: 1.0000 | ORAL_TABLET | Freq: Every evening | ORAL | Status: DC | PRN

## 2020-03-24 MED ORDER — ASPIRIN 81 MG PO CHEW: 81.0000 mg | CHEWABLE_TABLET | Freq: Every day | ORAL | Status: DC

## 2020-03-24 MED ORDER — INSULIN ASPART 100 UNIT/ML ~~LOC~~ SOLN
0.0000 [IU] | SUBCUTANEOUS | Status: DC
  Administered 2020-03-24: 3 [IU] via SUBCUTANEOUS
  Administered 2020-03-24: 8 [IU] via SUBCUTANEOUS
  Administered 2020-03-24: 5 [IU] via SUBCUTANEOUS
  Administered 2020-03-25 (×3): 3 [IU] via SUBCUTANEOUS
  Administered 2020-03-25: 5 [IU] via SUBCUTANEOUS
  Administered 2020-03-25: 3 [IU] via SUBCUTANEOUS
  Administered 2020-03-25: 2 [IU] via SUBCUTANEOUS
  Administered 2020-03-26: 3 [IU] via SUBCUTANEOUS
  Administered 2020-03-26 – 2020-03-27 (×3): 2 [IU] via SUBCUTANEOUS
  Administered 2020-03-27: 3 [IU] via SUBCUTANEOUS
  Administered 2020-03-27: 2 [IU] via SUBCUTANEOUS
  Administered 2020-03-27 – 2020-03-28 (×5): 3 [IU] via SUBCUTANEOUS
  Administered 2020-03-28: 5 [IU] via SUBCUTANEOUS
  Administered 2020-03-28 (×2): 3 [IU] via SUBCUTANEOUS
  Administered 2020-03-28 – 2020-03-29 (×6): 5 [IU] via SUBCUTANEOUS

## 2020-03-24 MED ORDER — SODIUM CHLORIDE 0.9 % IV SOLN: INTRAVENOUS | Status: DC

## 2020-03-24 MED ORDER — LIDOCAINE 2% (20 MG/ML) 5 ML SYRINGE
INTRAMUSCULAR | Status: DC | PRN
  Administered 2020-03-24: 80 mg via INTRAVENOUS

## 2020-03-24 MED ORDER — PHENYLEPHRINE HCL-NACL 10-0.9 MG/250ML-% IV SOLN
INTRAVENOUS | Status: DC | PRN
  Administered 2020-03-24: 25 ug/min via INTRAVENOUS

## 2020-03-24 MED ORDER — IOHEXOL 240 MG/ML SOLN
INTRAMUSCULAR | Status: AC
  Filled 2020-03-24: qty 200

## 2020-03-24 MED ORDER — CHLORHEXIDINE GLUCONATE 0.12% ORAL RINSE (MEDLINE KIT)
15.0000 mL | Freq: Two times a day (BID) | OROMUCOSAL | Status: DC
  Administered 2020-03-24 – 2020-03-29 (×10): 15 mL via OROMUCOSAL

## 2020-03-24 MED ORDER — MIDAZOLAM 50MG/50ML (1MG/ML) PREMIX INFUSION
0.0000 mg/h | INTRAVENOUS | Status: DC
  Administered 2020-03-24 – 2020-03-25 (×3): 10 mg/h via INTRAVENOUS
  Filled 2020-03-24 (×3): qty 50

## 2020-03-24 MED ORDER — NITROGLYCERIN 1 MG/10 ML FOR IR/CATH LAB
INTRA_ARTERIAL | Status: AC
  Filled 2020-03-24: qty 10

## 2020-03-24 MED ORDER — DOCUSATE SODIUM 50 MG/5ML PO LIQD
100.0000 mg | Freq: Two times a day (BID) | ORAL | Status: DC
  Administered 2020-03-24: 12:00:00 100 mg via ORAL
  Filled 2020-03-24: qty 10

## 2020-03-24 MED ORDER — ACETAMINOPHEN 325 MG PO TABS: 650.0000 mg | ORAL_TABLET | ORAL | Status: DC | PRN

## 2020-03-24 MED ORDER — SODIUM CHLORIDE 0.9 % IV SOLN
50.0000 mL | Freq: Once | INTRAVENOUS | Status: AC
  Administered 2020-03-24: 09:00:00 50 mL via INTRAVENOUS

## 2020-03-24 MED ORDER — ROCURONIUM BROMIDE 10 MG/ML (PF) SYRINGE
PREFILLED_SYRINGE | INTRAVENOUS | Status: DC | PRN
  Administered 2020-03-24 (×2): 50 mg via INTRAVENOUS

## 2020-03-24 MED ORDER — EPTIFIBATIDE 20 MG/10ML IV SOLN
INTRAVENOUS | Status: AC
  Filled 2020-03-24: qty 10

## 2020-03-24 MED ORDER — FENTANYL CITRATE (PF) 250 MCG/5ML IJ SOLN
INTRAMUSCULAR | Status: DC | PRN
  Administered 2020-03-24: 150 ug via INTRAVENOUS
  Administered 2020-03-24: 100 ug via INTRAVENOUS

## 2020-03-24 MED ORDER — TIROFIBAN HCL IN NACL 5-0.9 MG/100ML-% IV SOLN
INTRAVENOUS | Status: AC
  Filled 2020-03-24: qty 100

## 2020-03-24 MED ORDER — LORAZEPAM 2 MG/ML IJ SOLN
2.0000 mg | Freq: Once | INTRAMUSCULAR | Status: AC
  Administered 2020-03-24: 2 mg via INTRAVENOUS
  Filled 2020-03-24: qty 1

## 2020-03-24 MED ORDER — IOHEXOL 300 MG/ML  SOLN
150.0000 mL | Freq: Once | INTRAMUSCULAR | Status: AC | PRN
  Administered 2020-03-24: 50 mL via INTRA_ARTERIAL

## 2020-03-24 MED ORDER — TICAGRELOR 90 MG PO TABS
90.0000 mg | ORAL_TABLET | Freq: Two times a day (BID) | ORAL | Status: DC
  Administered 2020-03-25 – 2020-03-27 (×5): 90 mg
  Filled 2020-03-24 (×4): qty 1

## 2020-03-24 MED ORDER — PROPOFOL 1000 MG/100ML IV EMUL
0.0000 ug/kg/min | INTRAVENOUS | Status: DC
  Administered 2020-03-24: 30 ug/kg/min via INTRAVENOUS
  Administered 2020-03-24: 20 ug/kg/min via INTRAVENOUS
  Administered 2020-03-24: 45 ug/kg/min via INTRAVENOUS
  Administered 2020-03-25 (×2): 35 ug/kg/min via INTRAVENOUS
  Filled 2020-03-24 (×5): qty 100

## 2020-03-24 MED ORDER — CEFAZOLIN SODIUM-DEXTROSE 2-3 GM-%(50ML) IV SOLR
INTRAVENOUS | Status: DC | PRN
  Administered 2020-03-24: 2 g via INTRAVENOUS

## 2020-03-24 MED ORDER — ACETAMINOPHEN 160 MG/5ML PO SOLN
650.0000 mg | ORAL | Status: DC | PRN
  Administered 2020-03-25 – 2020-04-01 (×16): 650 mg
  Filled 2020-03-24 (×18): qty 20.3

## 2020-03-24 MED ORDER — FENTANYL CITRATE (PF) 100 MCG/2ML IJ SOLN
50.0000 ug | INTRAMUSCULAR | Status: DC | PRN
  Administered 2020-03-25: 50 ug via INTRAVENOUS
  Filled 2020-03-24: qty 2

## 2020-03-24 MED ORDER — CLEVIDIPINE BUTYRATE 0.5 MG/ML IV EMUL
0.0000 mg/h | INTRAVENOUS | Status: DC
  Administered 2020-03-24: 21 mg/h via INTRAVENOUS
  Administered 2020-03-24: 16 mg/h via INTRAVENOUS
  Administered 2020-03-24: 8 mg/h via INTRAVENOUS
  Administered 2020-03-25: 4 mg/h via INTRAVENOUS
  Filled 2020-03-24 (×9): qty 50

## 2020-03-24 MED ORDER — EPTIFIBATIDE 20 MG/10ML IV SOLN
INTRAVENOUS | Status: AC | PRN
  Administered 2020-03-24 (×5): 1.5 mg

## 2020-03-24 MED ORDER — ASPIRIN 325 MG PO TABS
ORAL_TABLET | ORAL | Status: AC
  Filled 2020-03-24: qty 1

## 2020-03-24 MED ORDER — ONDANSETRON HCL 4 MG/2ML IJ SOLN
INTRAMUSCULAR | Status: DC | PRN
  Administered 2020-03-24: 4 mg via INTRAVENOUS

## 2020-03-24 MED ORDER — LACTATED RINGERS IV SOLN: INTRAVENOUS | Status: DC | PRN

## 2020-03-24 MED ORDER — ACETAMINOPHEN 650 MG RE SUPP: 650.0000 mg | RECTAL | Status: DC | PRN

## 2020-03-24 MED ORDER — NITROGLYCERIN 1 MG/10 ML FOR IR/CATH LAB
INTRA_ARTERIAL | Status: AC | PRN
  Administered 2020-03-24 (×2): 25 ug via INTRA_ARTERIAL

## 2020-03-24 MED ORDER — MIDAZOLAM BOLUS VIA INFUSION
10.0000 mg | Freq: Once | INTRAVENOUS | Status: AC
  Administered 2020-03-24: 10 mg via INTRAVENOUS
  Filled 2020-03-24: qty 10

## 2020-03-24 MED ORDER — VERAPAMIL HCL 2.5 MG/ML IV SOLN
INTRAVENOUS | Status: AC | PRN
  Administered 2020-03-24: 2.5 mg via INTRA_ARTERIAL

## 2020-03-24 MED ORDER — VERAPAMIL HCL 2.5 MG/ML IV SOLN
INTRAVENOUS | Status: AC
  Filled 2020-03-24: qty 2

## 2020-03-24 MED ORDER — IOHEXOL 350 MG/ML SOLN
100.0000 mL | Freq: Once | INTRAVENOUS | Status: AC | PRN
  Administered 2020-03-24: 06:00:00 100 mL via INTRAVENOUS

## 2020-03-24 MED ORDER — ACETAMINOPHEN 160 MG/5ML PO SOLN: 650.0000 mg | ORAL | Status: DC | PRN

## 2020-03-24 MED ORDER — POLYETHYLENE GLYCOL 3350 17 G PO PACK
17.0000 g | PACK | Freq: Every day | ORAL | Status: DC
  Administered 2020-03-25 – 2020-03-27 (×3): 17 g
  Filled 2020-03-24 (×3): qty 1

## 2020-03-24 MED ORDER — CEFAZOLIN SODIUM-DEXTROSE 2-4 GM/100ML-% IV SOLN
INTRAVENOUS | Status: AC
  Filled 2020-03-24: qty 100

## 2020-03-24 MED ORDER — CLEVIDIPINE BUTYRATE 0.5 MG/ML IV EMUL
0.0000 mg/h | INTRAVENOUS | Status: DC
  Administered 2020-03-24: 5 mg/h via INTRAVENOUS
  Administered 2020-03-24: 11 mg/h via INTRAVENOUS
  Administered 2020-03-24: 14 mg/h via INTRAVENOUS
  Administered 2020-03-24: 17:00:00 12 mg/h via INTRAVENOUS
  Filled 2020-03-24 (×6): qty 50

## 2020-03-24 MED ORDER — ORAL CARE MOUTH RINSE
15.0000 mL | OROMUCOSAL | Status: DC
  Administered 2020-03-24 – 2020-03-26 (×18): 15 mL via OROMUCOSAL

## 2020-03-24 MED ORDER — ASPIRIN 325 MG PO TABS
ORAL_TABLET | ORAL | Status: AC | PRN
  Administered 2020-03-24: 325 mg

## 2020-03-24 MED ORDER — IOHEXOL 300 MG/ML  SOLN
50.0000 mL | Freq: Once | INTRAMUSCULAR | Status: AC | PRN
  Administered 2020-03-24: 50 mL via INTRA_ARTERIAL

## 2020-03-24 MED ORDER — TICAGRELOR 90 MG PO TABS
90.0000 mg | ORAL_TABLET | Freq: Two times a day (BID) | ORAL | Status: DC
  Filled 2020-03-24: qty 1

## 2020-03-24 MED ORDER — TICAGRELOR 60 MG PO TABS
ORAL_TABLET | ORAL | Status: AC | PRN
  Administered 2020-03-24: 60 mg

## 2020-03-24 MED ORDER — ASPIRIN 81 MG PO CHEW
CHEWABLE_TABLET | ORAL | Status: AC
  Filled 2020-03-24: qty 1

## 2020-03-24 MED ORDER — CLEVIDIPINE BUTYRATE 0.5 MG/ML IV EMUL
0.0000 mg/h | INTRAVENOUS | Status: DC
  Administered 2020-03-24: 1 mg/h via INTRAVENOUS
  Filled 2020-03-24 (×2): qty 50

## 2020-03-24 MED ORDER — IOHEXOL 300 MG/ML  SOLN
150.0000 mL | Freq: Once | INTRAMUSCULAR | Status: AC | PRN
  Administered 2020-03-24: 10:00:00 94 mL via INTRA_ARTERIAL

## 2020-03-24 MED ORDER — ALTEPLASE (STROKE) FULL DOSE INFUSION
0.9000 mg/kg | Freq: Once | INTRAVENOUS | Status: AC
  Administered 2020-03-24: 07:00:00 77.3 mg via INTRAVENOUS
  Filled 2020-03-24: qty 100

## 2020-03-24 MED ORDER — PROPOFOL 10 MG/ML IV BOLUS
INTRAVENOUS | Status: DC | PRN
  Administered 2020-03-24: 30 mg via INTRAVENOUS
  Administered 2020-03-24: 100 mg via INTRAVENOUS

## 2020-03-24 MED ORDER — FENTANYL CITRATE (PF) 100 MCG/2ML IJ SOLN: 50.0000 ug | INTRAMUSCULAR | Status: DC | PRN

## 2020-03-24 MED ORDER — ASPIRIN 81 MG PO CHEW
81.0000 mg | CHEWABLE_TABLET | Freq: Every day | ORAL | Status: DC
  Administered 2020-03-25 – 2020-03-27 (×3): 81 mg
  Filled 2020-03-24 (×3): qty 1

## 2020-03-24 MED ORDER — TICAGRELOR 90 MG PO TABS
ORAL_TABLET | ORAL | Status: AC
  Filled 2020-03-24: qty 2

## 2020-03-24 MED ORDER — TICAGRELOR 60 MG PO TABS
60.0000 mg | ORAL_TABLET | Freq: Once | ORAL | Status: DC
  Filled 2020-03-24: qty 1

## 2020-03-24 MED ORDER — PHENYLEPHRINE HCL-NACL 10-0.9 MG/250ML-% IV SOLN
0.0000 ug/min | INTRAVENOUS | Status: DC
  Administered 2020-03-24: 10 ug/min via INTRAVENOUS

## 2020-03-24 MED ORDER — POLYETHYLENE GLYCOL 3350 17 G PO PACK
17.0000 g | PACK | Freq: Every day | ORAL | Status: DC
  Administered 2020-03-24: 12:00:00 17 g via ORAL
  Filled 2020-03-24: qty 1

## 2020-03-24 MED ORDER — PANTOPRAZOLE SODIUM 40 MG IV SOLR
40.0000 mg | Freq: Every day | INTRAVENOUS | Status: DC
  Administered 2020-03-24 – 2020-03-27 (×4): 40 mg via INTRAVENOUS
  Filled 2020-03-24 (×4): qty 40

## 2020-03-24 MED ORDER — DOCUSATE SODIUM 50 MG/5ML PO LIQD
100.0000 mg | Freq: Two times a day (BID) | ORAL | Status: DC
  Administered 2020-03-24 – 2020-03-27 (×6): 100 mg
  Filled 2020-03-24 (×6): qty 10

## 2020-03-24 MED ORDER — CHLORHEXIDINE GLUCONATE CLOTH 2 % EX PADS
6.0000 | MEDICATED_PAD | Freq: Every day | CUTANEOUS | Status: DC
  Administered 2020-03-24 – 2020-04-16 (×20): 6 via TOPICAL

## 2020-03-24 MED ORDER — PHENYLEPHRINE 40 MCG/ML (10ML) SYRINGE FOR IV PUSH (FOR BLOOD PRESSURE SUPPORT)
PREFILLED_SYRINGE | INTRAVENOUS | Status: DC | PRN
  Administered 2020-03-24: 120 ug via INTRAVENOUS

## 2020-03-24 MED ORDER — STROKE: EARLY STAGES OF RECOVERY BOOK
Freq: Once | Status: AC
  Filled 2020-03-24: qty 1

## 2020-03-24 MED ORDER — TICAGRELOR 90 MG PO TABS
ORAL_TABLET | ORAL | Status: AC
  Filled 2020-03-24: qty 1

## 2020-03-24 MED ORDER — CLOPIDOGREL BISULFATE 300 MG PO TABS
ORAL_TABLET | ORAL | Status: AC
  Filled 2020-03-24: qty 1

## 2020-03-24 MED ORDER — FAMOTIDINE IN NACL 20-0.9 MG/50ML-% IV SOLN: 20.0000 mg | INTRAVENOUS | Status: DC

## 2020-03-24 NOTE — ED Notes (Signed)
Code stroke paged out.  

## 2020-03-24 NOTE — ED Provider Notes (Signed)
[facility] Provider Note   CSN: [id number] Arrival date & time: 03/24/20  [id number]  An emergency department physician performed an initial assessment on this suspected stroke patient at 0536.  History Chief Complaint  Patient presents with  . Code Stroke    Brad Delacruz is a 59 y.o. male.  Patient brought to the emergency department as a code stroke.  Patient was at work when he had sudden onset of acute mental status changes.  Last known normal was 5:02 AM.  EMS brought the patient to the emergency department emergency traffic.  Patient aphasic with right-sided weakness. Level V Caveat due to acuity.        Past Medical History:  Diagnosis Date  . Diabetes mellitus without complication (HCC)     There are no problems to display for this patient.   History reviewed. No pertinent surgical history.     History reviewed. No pertinent family history.  Social History   Tobacco Use  . Smoking status: Never Smoker  . Smokeless tobacco: Never Used  Substance Use Topics  . Alcohol use: Not Currently  . Drug use: Never    Home Medications Prior to Admission medications   Medication Sig Start Date End Date Taking? Authorizing Provider  ciclopirox (PENLAC) 8 % solution Apply topically as directed. 10/03/18   [provider]  dapagliflozin propanediol (FARXIGA) 10 MG TABS tablet Take 10 mg by mouth daily. 02/11/18   [provider]  losartan (COZAAR) 25 MG tablet Take 25 mg by mouth daily. 01/23/19   [provider]  pravastatin (PRAVACHOL) 10 MG tablet Take 10 mg by mouth daily. 09/07/18   [provider]  XIGDUO XR 09-999 MG TB24 Take 1 tablet by mouth daily. 01/24/19   [provider]    Allergies    Patient has no known allergies.  Review of Systems   Review of Systems  Unable to perform ROS: Acuity of condition    Physical Exam Updated Vital Signs BP (!) 177/95   Pulse 78   Resp (!) 23   Ht  5\' 5"  (1.651 m)   Wt 85.9 kg   SpO2 97%   BMI 31.51 kg/m   Physical Exam Vitals and nursing note reviewed.  Constitutional:      General: He is not in acute distress.    Appearance: Normal appearance. He is well-developed.  HENT:     Head: Normocephalic and atraumatic.     Right Ear: Hearing normal.     Left Ear: Hearing normal.     Nose: Nose normal.  Eyes:     Conjunctiva/sclera: Conjunctivae normal.     Pupils: Pupils are equal, round, and reactive to light.  Cardiovascular:     Rate and Rhythm: Regular rhythm.     Heart sounds: S1 normal and S2 normal. No murmur. No friction rub. No gallop.   Pulmonary:     Effort: Pulmonary effort is normal. No respiratory distress.     Breath sounds: Normal breath sounds.  Chest:     Chest wall: No tenderness.  Abdominal:     General: Bowel sounds are normal.     Palpations: Abdomen is soft.     Tenderness: There is no abdominal tenderness. There is no guarding or rebound. Negative signs include Murphy's sign and McBurney's sign.     Hernia: No hernia is present.  Musculoskeletal:        General: Normal range of motion.     Cervical back: Normal  range of motion and neck supple.  Skin:    General: Skin is warm and dry.     Findings: No rash.  Neurological:     Mental Status: He is alert.     GCS: GCS eye subscore is 4. GCS verbal subscore is 5. GCS motor subscore is 6.     Comments: Aphasic R hemiparesis  Left gaze persistent, will not cross midline to the right      ED Results / Procedures / Treatments   Labs (all labs ordered are listed, but only abnormal results are displayed) Labs Reviewed  CBC - Abnormal; Notable for the following components:      Result Value   MCHC 36.1 (*)    Platelets 120 (*)    All other components within normal limits  COMPREHENSIVE METABOLIC PANEL - Abnormal; Notable for the following components:   Sodium 130 (*)    Potassium 3.3 (*)    Chloride 94 (*)    Glucose, Bld 309 (*)    All  other components within normal limits  I-STAT CHEM 8, ED - Abnormal; Notable for the following components:   Sodium 133 (*)    Potassium 3.3 (*)    Chloride 96 (*)    Glucose, Bld 293 (*)    All other components within normal limits  CBG MONITORING, ED - Abnormal; Notable for the following components:   Glucose-Capillary 288 (*)    All other components within normal limits  RESPIRATORY PANEL BY RT PCR (FLU A&B, COVID)  ETHANOL  PROTIME-INR  APTT  DIFFERENTIAL  RAPID URINE DRUG SCREEN, HOSP PERFORMED  URINALYSIS, ROUTINE W REFLEX MICROSCOPIC    EKG None  Radiology CT Angio Head W or Wo Contrast  Result Date: 03/24/2020 CLINICAL DATA:  Patient found down at 5 a.m. Right-sided weakness. Abnormal speech. Facial droop. EXAM: CT ANGIOGRAPHY HEAD AND NECK CT PERFUSION BRAIN TECHNIQUE: Multidetector CT imaging of the head and neck was performed using the standard protocol during bolus administration of intravenous contrast. Multiplanar CT image reconstructions and MIPs were obtained to evaluate the vascular anatomy. Carotid stenosis measurements (when applicable) are obtained utilizing NASCET criteria, using the distal internal carotid diameter as the denominator. Multiphase CT imaging of the brain was performed following IV bolus contrast injection. Subsequent parametric perfusion maps were calculated using RAPID software. CONTRAST:  OMNIPAQUE IOHEXOL 350 MG/ML SOLN COMPARISON:  CT head without contrast 03/24/2020 FINDINGS: CTA NECK FINDINGS Aortic arch: A 3 vessel arch configuration is present. Atherosclerotic calcifications are present at the aortic arch. No aneurysm or significant stenosis is present. Right carotid system: Right common carotid artery is within normal limits. Atherosclerotic changes are present at the bifurcation without significant stenosis. Study is mildly degraded by dental artifact. Left carotid system: The left common carotid artery is within normal limits.  Atherosclerotic changes are present at the proximal left ICA without a significant stenosis relative to the more distal vessel. Vertebral arteries: The right vertebral artery is the dominant vessel. Both vertebral arteries originate from the subclavian arteries without significant stenosis. Is no significant stenosis of either vertebral artery in the neck. Skeleton: Degenerative changes are present cervical spine with slight reversal of normal cervical lordosis. No focal lytic or blastic lesions are present. Other neck: The soft tissues the neck are unremarkable. Upper chest: Mild dependent atelectasis is present. Thoracic inlet is normal. Review of the MIP images confirms the above findings CTA HEAD FINDINGS Anterior circulation: Internal carotid arteries are within normal limits through the ICA  termini bilaterally. The A1 segments are normal bilaterally. The anterior communicating artery is patent. Right M1 segment is normal. Left M1 segment is occluded proximal to the bifurcation. Collateral vessels are evident within the sylvian fissure. Bilateral ACA and right MCA branch vessels are within normal limits. Posterior circulation: Internal carotid right vertebral artery is the dominant vessel. The non dominant left vertebral artery terminates at the PICA. Right vertebral artery becomes the basilar artery. Both posterior cerebral arteries originate from basilar tip. Prominent left posterior communicating artery is present. PCA branch vessels are within normal limits bilaterally. Venous sinuses: The dural sinuses are patent. The straight sinus deep cerebral veins are intact. Cortical veins are within normal limits. Anatomic variants: Prominent left posterior communicating artery. Review of the MIP images confirms the above findings CT Brain Perfusion Findings: ASPECTS: 10/10 CBF (<30%) Volume: 83mL Perfusion (Tmax>6.0s) volume: 82mL Mismatch Volume: 99mL Infarction Location:Left MCA IMPRESSION: 1. Large left MCA  territory nonhemorrhagic infarct. 2. Acute left M1 large vessel occlusion. 3. Large penumbra with mismatch volume of 99 mL. 4. Collateral vessels are present within the sylvian fissure. 5. Atherosclerotic changes at the carotid bifurcations bilaterally without significant stenosis. 6. Aortic Atherosclerosis (ICD10-I70.0). No significant stenosis at the arch. These results were called by telephone at the time of interpretation on 03/24/2020 at 6:13 am to provider [facility] , who verbally acknowledged these results. Electronically Signed   By: Marin Roberts M.D.   On: 03/24/2020 06:26   CT Angio Neck W and/or Wo Contrast  Result Date: 03/24/2020 CLINICAL DATA:  Patient found down at 5 a.m. Right-sided weakness. Abnormal speech. Facial droop. EXAM: CT ANGIOGRAPHY HEAD AND NECK CT PERFUSION BRAIN TECHNIQUE: Multidetector CT imaging of the head and neck was performed using the standard protocol during bolus administration of intravenous contrast. Multiplanar CT image reconstructions and MIPs were obtained to evaluate the vascular anatomy. Carotid stenosis measurements (when applicable) are obtained utilizing NASCET criteria, using the distal internal carotid diameter as the denominator. Multiphase CT imaging of the brain was performed following IV bolus contrast injection. Subsequent parametric perfusion maps were calculated using RAPID software. CONTRAST:  OMNIPAQUE IOHEXOL 350 MG/ML SOLN COMPARISON:  CT head without contrast 03/24/2020 FINDINGS: CTA NECK FINDINGS Aortic arch: A 3 vessel arch configuration is present. Atherosclerotic calcifications are present at the aortic arch. No aneurysm or significant stenosis is present. Right carotid system: Right common carotid artery is within normal limits. Atherosclerotic changes are present at the bifurcation without significant stenosis. Study is mildly degraded by dental artifact. Left carotid system: The left common carotid artery is within  normal limits. Atherosclerotic changes are present at the proximal left ICA without a significant stenosis relative to the more distal vessel. Vertebral arteries: The right vertebral artery is the dominant vessel. Both vertebral arteries originate from the subclavian arteries without significant stenosis. Is no significant stenosis of either vertebral artery in the neck. Skeleton: Degenerative changes are present cervical spine with slight reversal of normal cervical lordosis. No focal lytic or blastic lesions are present. Other neck: The soft tissues the neck are unremarkable. Upper chest: Mild dependent atelectasis is present. Thoracic inlet is normal. Review of the MIP images confirms the above findings CTA HEAD FINDINGS Anterior circulation: Internal carotid arteries are within normal limits through the ICA termini bilaterally. The A1 segments are normal bilaterally. The anterior communicating artery is patent. Right M1 segment is normal. Left M1 segment is occluded proximal to the bifurcation. Collateral vessels are evident within the sylvian fissure. Bilateral  ACA and right MCA branch vessels are within normal limits. Posterior circulation: Internal carotid right vertebral artery is the dominant vessel. The non dominant left vertebral artery terminates at the PICA. Right vertebral artery becomes the basilar artery. Both posterior cerebral arteries originate from basilar tip. Prominent left posterior communicating artery is present. PCA branch vessels are within normal limits bilaterally. Venous sinuses: The dural sinuses are patent. The straight sinus deep cerebral veins are intact. Cortical veins are within normal limits. Anatomic variants: Prominent left posterior communicating artery. Review of the MIP images confirms the above findings CT Brain Perfusion Findings: ASPECTS: 10/10 CBF (<30%) Volume: 24mL Perfusion (Tmax>6.0s) volume: 15mL Mismatch Volume: 24mL Infarction Location:Left MCA IMPRESSION: 1.  Large left MCA territory nonhemorrhagic infarct. 2. Acute left M1 large vessel occlusion. 3. Large penumbra with mismatch volume of 99 mL. 4. Collateral vessels are present within the sylvian fissure. 5. Atherosclerotic changes at the carotid bifurcations bilaterally without significant stenosis. 6. Aortic Atherosclerosis (ICD10-I70.0). No significant stenosis at the arch. These results were called by telephone at the time of interpretation on 03/24/2020 at 6:13 am to provider [facility] , who verbally acknowledged these results. Electronically Signed   By: Marin Roberts M.D.   On: 03/24/2020 06:26   CT CEREBRAL PERFUSION W CONTRAST  Result Date: 03/24/2020 CLINICAL DATA:  Patient found down at 5 a.m. Right-sided weakness. Abnormal speech. Facial droop. EXAM: CT ANGIOGRAPHY HEAD AND NECK CT PERFUSION BRAIN TECHNIQUE: Multidetector CT imaging of the head and neck was performed using the standard protocol during bolus administration of intravenous contrast. Multiplanar CT image reconstructions and MIPs were obtained to evaluate the vascular anatomy. Carotid stenosis measurements (when applicable) are obtained utilizing NASCET criteria, using the distal internal carotid diameter as the denominator. Multiphase CT imaging of the brain was performed following IV bolus contrast injection. Subsequent parametric perfusion maps were calculated using RAPID software. CONTRAST:  OMNIPAQUE IOHEXOL 350 MG/ML SOLN COMPARISON:  CT head without contrast 03/24/2020 FINDINGS: CTA NECK FINDINGS Aortic arch: A 3 vessel arch configuration is present. Atherosclerotic calcifications are present at the aortic arch. No aneurysm or significant stenosis is present. Right carotid system: Right common carotid artery is within normal limits. Atherosclerotic changes are present at the bifurcation without significant stenosis. Study is mildly degraded by dental artifact. Left carotid system: The left common carotid artery is  within normal limits. Atherosclerotic changes are present at the proximal left ICA without a significant stenosis relative to the more distal vessel. Vertebral arteries: The right vertebral artery is the dominant vessel. Both vertebral arteries originate from the subclavian arteries without significant stenosis. Is no significant stenosis of either vertebral artery in the neck. Skeleton: Degenerative changes are present cervical spine with slight reversal of normal cervical lordosis. No focal lytic or blastic lesions are present. Other neck: The soft tissues the neck are unremarkable. Upper chest: Mild dependent atelectasis is present. Thoracic inlet is normal. Review of the MIP images confirms the above findings CTA HEAD FINDINGS Anterior circulation: Internal carotid arteries are within normal limits through the ICA termini bilaterally. The A1 segments are normal bilaterally. The anterior communicating artery is patent. Right M1 segment is normal. Left M1 segment is occluded proximal to the bifurcation. Collateral vessels are evident within the sylvian fissure. Bilateral ACA and right MCA branch vessels are within normal limits. Posterior circulation: Internal carotid right vertebral artery is the dominant vessel. The non dominant left vertebral artery terminates at the PICA. Right vertebral artery becomes the basilar artery. Both  posterior cerebral arteries originate from basilar tip. Prominent left posterior communicating artery is present. PCA branch vessels are within normal limits bilaterally. Venous sinuses: The dural sinuses are patent. The straight sinus deep cerebral veins are intact. Cortical veins are within normal limits. Anatomic variants: Prominent left posterior communicating artery. Review of the MIP images confirms the above findings CT Brain Perfusion Findings: ASPECTS: 10/10 CBF (<30%) Volume: 52mL Perfusion (Tmax>6.0s) volume: 46mL Mismatch Volume: 70mL Infarction Location:Left MCA IMPRESSION:  1. Large left MCA territory nonhemorrhagic infarct. 2. Acute left M1 large vessel occlusion. 3. Large penumbra with mismatch volume of 99 mL. 4. Collateral vessels are present within the sylvian fissure. 5. Atherosclerotic changes at the carotid bifurcations bilaterally without significant stenosis. 6. Aortic Atherosclerosis (ICD10-I70.0). No significant stenosis at the arch. These results were called by telephone at the time of interpretation on 03/24/2020 at 6:13 am to provider [facility] , who verbally acknowledged these results. Electronically Signed   By: Marin Roberts M.D.   On: 03/24/2020 06:26   CT HEAD CODE STROKE WO CONTRAST  Result Date: 03/24/2020 CLINICAL DATA:  Code stroke. Ataxia. Right-sided weakness. Abnormal speech. Facial droop. Symptoms began at 5 a.m. EXAM: CT HEAD WITHOUT CONTRAST TECHNIQUE: Contiguous axial images were obtained from the base of the skull through the vertex without intravenous contrast. COMPARISON:  None. FINDINGS: Brain: No acute infarct, hemorrhage, or mass lesion is present. Remote infarct is present in the left corona radiata and anterior limb internal capsule. The basal ganglia are intact. Insular ribbon is normal. No acute hemorrhage or mass lesion is present. The ventricles are of normal size. No significant extraaxial fluid collection is present. The brainstem and cerebellum are within normal limits. Vascular: No hyperdense vessel or unexpected calcification. Skull: Calvarium is intact. No focal lytic or blastic lesions are present. No significant extracranial soft tissue lesion is present. Sinuses/Orbits: The paranasal sinuses and mastoid air cells are clear. The globes and orbits are within normal limits. ASPECTS [facility] Stroke Program Early CT Score) - Ganglionic level infarction (caudate, lentiform nuclei, internal capsule, insula, M1-M3 cortex): 7/7 - Supraganglionic infarction (M4-M6 cortex): 3/3 Total score (0-10 with 10 being normal): 10/10  IMPRESSION: 1. No acute intracranial abnormality. 2. 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 3. ASPECTS is 10/10 These results were called by telephone at the time of interpretation on 03/24/2020 at 5:46 am to provider [facility] , who verbally acknowledged these results. Electronically Signed   By: Marin Roberts M.D.   On: 03/24/2020 05:46    Procedures Procedures (including critical care time)  Medications Ordered in ED Medications  iohexol (OMNIPAQUE) 350 MG/ML injection 100 mL (100 mLs Intravenous Contrast Given 03/24/20 0548)    ED Course  I have reviewed the triage vital signs and the nursing notes.  Pertinent labs & imaging results that were available during my care of the patient were reviewed by me and considered in my medical decision making (see chart for details).    MDM Rules/Calculators/A&P                      Patient presents to the emergency department as a code stroke.  Patient was at work when he had sudden neurologic changes.  Patient apparently fell to the ground and would not speak.  Upon arrival to the ER patient has evidence of large vessel occlusion.  He has a right dense hemiparesis with at least expressive aphasia and leftward gaze preference.  Screening CT  did not show obvious infarct but CT perfusion study does show evidence of evolving infarct with a larger than expected core.  There were extensive delays in the care of this patient because he is aphasic and we could not reach any family.  TPA checklist could not be completed because we do not have answers to the contraindication questions and therefore teleneurology could not recommend administering TPA.  Patient's daughter did arrive in the ED here just as the patient was about to be transported to [facility].  We held the patient in an attempt to get her to answer questions and consent for TPA, but she did not know the patient's medications.  She tried multiple times to get  through to her mother but could not.  It was then decided to transport the patient emergently to [facility] while the daughter continues to attempt to get in touch with her mother, as TPA might still be able to be administered when he arrives at [facility].  Patient can also have interventional radiology procedure at [facility].  CRITICAL CARE Performed by: Orpah Greek   Total critical care time: 45 minutes  Critical care time was exclusive of separately billable procedures and treating other patients.  Critical care was necessary to treat or prevent imminent or life-threatening deterioration.  Critical care was time spent personally by me on the following activities: development of treatment plan with patient and/or surrogate as well as nursing, discussions with consultants, evaluation of patient's response to treatment, examination of patient, obtaining history from patient or surrogate, ordering and performing treatments and interventions, ordering and review of laboratory studies, ordering and review of radiographic studies, pulse oximetry and re-evaluation of patient's condition.  Final Clinical Impression(s) / ED Diagnoses Final diagnoses:  Cerebrovascular accident (CVA), unspecified mechanism [facility])    Rx / DC Orders ED Discharge Orders    None       Taylie Helder, Gwenyth Allegra, MD 03/24/20 [id number]

## 2020-03-24 NOTE — Consult Note (Signed)
[facility] TeleNeurology Consult Services   Date of Service:   03/24/2020 05:55:15  Impression:     .  O67.124 - Cerebrovascular accident (CVA) due to embolism of left middle cerebral artery (HCCC)  Comments/Sign-Out: 59 yo M hx reported diabetes but no other collateral medical or social history due to lack of access to family/coworkers. Presents w NIHSS 28 dense L MCA syndrome with M1 occlusion. ASPECTS 10 but CTP suggests an infarct core of 83 and remaining penumbra of 99, mismatch ratio 2.2. Onset was reported to be exactly at [facility] when he fell at work. I made multiple attempts to contact family with the numbers in the EMR to verify contraindications for safe IV alteplase administration prior to transport with no success.   I called the transfer line at 0610.  Dr. Otelia Limes called me back at [phone] to discuss the case.  I communicated above to Dr. Otelia Limes and he made arrangements to contact the [facility] ED and the [facility] to accept the case. ED physician states that ambulance is pulling as of [phone] and i could not reach a family member before then.   Dr Otelia Limes called back at 0631 to follow up on tPA decision and family contact into. Since we could not reliably confirm medical contraindications to tPA I recommended full dose rectal aspirin and permissive HTN prior to transport. The patient is en route to [facility] now for EVT. I was later given another contact number at [phone] for [name] [phone]) but this goes directly to voicemail and I had no success in reaching her or the other numbers over the past 45 minutes.    Metrics: Last Known Well: 03/24/2020 05:00:00 [facility] Notification Time: 03/24/2020 05:55:15 Arrival Time: 03/24/2020 05:36:00 Stamp Time: 03/24/2020 05:55:15 Time First Login Attempt: 03/24/2020 06:00:55 Symptoms: right sided weakness NIHSS Start Assessment Time: 03/24/2020 06:10:02 Patient is not a candidate for Alteplase/Activase. Alteplase  Medical Decision: 03/24/2020 06:28:00 Reason for delay in Medical Decision Making for thrombolytics: Awaiting on history by family on potential contraindications. Patient was not deemed candidate for Alteplase/Activase thrombolytics because of Cannnot confirm CI with anyone (family not picking up phone).  CT head showed no acute hemorrhage or acute core infarct.  Clinical Presentation is Suggestive of Large Vessel Occlusive Disease, Recommendations are as Follows  CTA Head and Neck. CT Perfusion. Advanced Imaging is Suggestive of Large Vessel Occlusion, Neurointerventional Specialist to be Consulted.   Radiologist was called back for review of advanced imaging on 03/24/2020 06:10:15 Discussed with Neurointerventionalist on 03/24/2020 06:31:15 ED Physician notified of diagnostic impression and management plan on 03/24/2020 06:34:18  Our recommendations are outlined below.  Recommendations:     .  Activate Stroke Protocol Admission/Order Set     .  Stroke/Telemetry Floor     .  Neuro Checks     .  Bedside Swallow Eval     .  DVT Prophylaxis     .  IV Fluids, Normal Saline     .  Head of Bed 30 Degrees     .  Euglycemia and Avoid Hyperthermia (PRN Acetaminophen)      .  Antiplatelet Therapy Recommended     .  Rectal aspirin now     .  Transport is here as of 0628  STAT transfer to [facility] for IR thrombectomy and post procedure ICU admission  Sign Out:     .  Discussed with Emergency Department Provider    ------------------------------------------------------------------------------  History of Present Illness: Patient is a  59 year old Male.  Patient was brought by EMS for symptoms of right sided weakness  59 yo Hispanic M fluent in [place] hx diabetes with no other medical information or collateral available who fell and is aphasic with right sided deficits. This occurred at work and witnessed just after 0500 this morning. His CT showed ASPECTS of 10 but CTP shows  concern for a large L MCA territory at risk of 182 cc, less than half of which is already infarcted. Given this disecrepancy and no other medical collateral to confirm whether I can safely give him tPA I requested family contact info. For the past 30 minutes prior to me being contacted, the facility and telestroke RN have made several attempts to call all the phone numbers in the chart as well as his coworkers with no succcess  Last seen normal was within 4.5 hours. There is no history of hemorrhagic complications or intracranial hemorrhage. There is no history of Recent Anticoagulants. There is no history of recent major surgery. There is no history of recent stroke.  Past Medical History:     . Diabetes Mellitus      Examination: BP(177/95), Pulse(78), Blood Glucose(288) 1A: Level of Consciousness - Alert; keenly responsive + 0 1B: Ask Month and Age - Aphasic + 2 1C: Blink Eyes & Squeeze Hands - Performs 0 Tasks + 2 2: Test Horizontal Extraocular Movements - Forced Gaze Palsy: Cannot Be Overcome + 2 3: Test Visual Fields - Complete Hemianopia + 2 4: Test Facial Palsy (Use Grimace if Obtunded) - Partial paralysis (lower face) + 2 5A: Test Left Arm Motor Drift - No Drift for 10 Seconds + 0 5B: Test Right Arm Motor Drift - No Movement + 4 6A: Test Left Leg Motor Drift - Drift, hits bed + 2 6B: Test Right Leg Motor Drift - No Movement + 4 7: Test Limb Ataxia (FNF/Heel-Shin) - No Ataxia + 0 8: Test Sensation - Complete Loss: Cannot Sense Being Touched At All + 2 9: Test Language/Aphasia - Severe Aphasia: Fragmentary Expression, Inference Needed, Cannot Identify Materials + 2 10: Test Dysarthria - Mute/Anarthric + 2 11: Test Extinction/Inattention - Profound hemi-inattention (ex: does not recognize own hand) + 2  NIHSS Score: 28    Patient/Family was informed the Neurology Consult would occur via TeleHealth consult by way of interactive audio and video telecommunications and  consented to receiving care in this manner.   Due to the immediate potential for life-threatening deterioration due to underlying acute neurologic illness, I spent 35 minutes providing critical care. This time includes time for face to face visit via telemedicine, review of medical records, imaging studies and discussion of findings with providers, the patient and/or family.   Dr Leda Quail   [facility] [phone]  Case [id number]

## 2020-03-24 NOTE — ED Triage Notes (Signed)
Pt arrives via EMS for code stroke. Pt was at work after falling at work then became confused. Pt arrives with severe right sided deficits and is unable to speak at this time. Pt taken right to CT.

## 2020-03-24 NOTE — Progress Notes (Signed)
Patient ID: Brad Delacruz, male   DOB: May 13, 1961, 59 y.o.   MRN: [id number]  INR. 58 Y RT H M LSW 500am MRSS 0. [place] RT sided hemiplegia and aphasia NIHSS 28. CT brain NO ICH ASPECTS 10  CTA occluded LT MCA M 1 seg. CTP maps core of 26ml and penumbra of 24ml(Tmax > 6s of ). Patients condition  discussed with patients family?daughter. Procedure of endovascular revascularization reviewed with daughter by Dr Otelia Limes. Reasons ,alterantives reviewed. Risk of ICH of 10 % ,worsening neuro deficit ,death and inability to revascularize reviewed. Family member acknowledged and provided coonsent to proeed. S.Verdell Kincannon MD

## 2020-03-24 NOTE — Progress Notes (Signed)
Spoke w/ pts dtr to provide updates. 

## 2020-03-24 NOTE — Progress Notes (Signed)
Agitated despite maximum rate of propofol. Also with SBP in 150s-160s range despite maximum drip rate of clevidipine. HR in 90s.   He may be experiencing withdrawal symptoms, to EtOH or benzodiazepine.  Starting Versed with 10 mg bolus followed by infusion at 10 mg/hr.   Electronically signed: Dr. Caryl Pina

## 2020-03-24 NOTE — Progress Notes (Signed)
[id number] call time 0536 beeper time 0539 exam started 0540 exam finished 0540 images sent to soc 0541 exam completed in Epic 0541 [facility] radiology called

## 2020-03-24 NOTE — Progress Notes (Signed)
PT Cancellation Note  Patient Details Name: Brad Delacruz MRN: [id number] DOB: [birth year]   Cancelled Treatment:    Reason Eval/Treat Not Completed: Patient not medically ready.  On bedrest at this time.  Will see 4/21 as able. 03/24/2020  Jacinto Halim., PT Acute Rehabilitation Services [phone]  (pager) [phone]  (office)   Eliseo Gum Andie Mungin 03/24/2020, 2:39 PM

## 2020-03-24 NOTE — Anesthesia Procedure Notes (Signed)
Procedure Name: Intubation Date/Time: 03/24/2020 7:30 AM Performed by: Mayer Camel, CRNA Pre-anesthesia Checklist: Patient identified, Emergency Drugs available, Suction available and Patient being monitored Patient Re-evaluated:Patient Re-evaluated prior to induction Oxygen Delivery Method: Circle System Utilized Preoxygenation: Pre-oxygenation with 100% oxygen Induction Type: IV induction and Rapid sequence Laryngoscope Size: Glidescope and 4 Grade View: Grade I Tube type: Oral Tube size: 7.5 mm Number of attempts: 1 Airway Equipment and Method: Stylet and Video-laryngoscopy Placement Confirmation: ETT inserted through vocal cords under direct vision,  positive ETCO2 and breath sounds checked- equal and bilateral Secured at: 22 cm Tube secured with: Tape Dental Injury: Teeth and Oropharynx as per pre-operative assessment

## 2020-03-24 NOTE — ED Notes (Signed)
[facility] called for transport to [place]

## 2020-03-24 NOTE — Progress Notes (Signed)
Repeat CTH IMPRESSION: 1. Acute left middle cerebral and left posterior cerebral artery territory infarct without mass effect. 2. Stable subarachnoid hemorrhage on the left. 3. Stable left middle cerebral artery stent. 4. Stable small, old curvilinear infarct along the lateral margin of the left caudate head.   Follow recs from IR and initial neurology H&P  Stroke team to follow.  -- Milon Dikes, MD [facility] Pager: [phone] If 7pm to 7am, please call on call as listed on AMION.

## 2020-03-24 NOTE — Procedures (Signed)
S/P Lt common carotid artreriogram followed by revascularization of occluded Lt MCA M 1 seg with x1 pass with solitaire7mm x 40 mm X retriever,x 2 passes solitaire 11mm x 40 mm X retriever and x 2 passes with 27mm c 33 mm embotrap retriever achieving a TICI 2C revascularization and rescue stenting for reocclusion.Marland Kitchen Post procedure CT brain reveale s Lt parietal and periinsular subarachnoid density,contrast +/- blood. No mass effect. No midline shift. RT groin closure with an 17F angioseal. Distal, pulses all dopplerable. Pupils 45mm RT = LT Patient left intubated to maintain airwayand poor comminacation due to the stroke. S.Dajuana Palen MD

## 2020-03-24 NOTE — Progress Notes (Signed)
Inpatient Diabetes Program Recommendations  AACE/ADA: New Consensus Statement on Inpatient Glycemic Control   Target Ranges:  Prepandial:   less than 140 mg/dL      Peak postprandial:   less than 180 mg/dL (1-2 hours)      Critically ill patients:  140 - 180 mg/dL   Results for CEASER, EBELING (MRN [id number]) as of 03/24/2020 09:21  Ref. Range 03/24/2020 06:05  Glucose-Capillary Latest Ref Range: 70 - 99 mg/dL 295 (H)  Results for KEVAN, PROUTY (MRN [id number]) as of 03/24/2020 09:21  Ref. Range 03/24/2020 06:01 03/24/2020 06:13  Glucose Latest Ref Range: 70 - 99 mg/dL 964 (H) 383 (H)   Review of Glycemic Control  Diabetes history: DM2 Outpatient Diabetes medications: Farxiga 10 mg daily, Xigduo XR 09-999 mg daily Current orders for Inpatient glycemic control: NONE  Inpatient Diabetes Program Recommendations:   Correction (SSI): Please consider ordering CBGs Q4H with Novolog 0-15 units Q4H.  HgbA1C: A1C already ordered for 03/25/20.  Thanks, Orlando Penner, RN, MSN, CDE Diabetes Coordinator Inpatient Diabetes Program [phone] (Team Pager from 8am to 5pm)

## 2020-03-24 NOTE — Sedation Documentation (Signed)
Bedside report given to [facility] RN. Groin checks completed at this time.

## 2020-03-24 NOTE — Progress Notes (Signed)
Pharmacist Code Stroke Response  Notified to mix tPA at 0705 by Dr. Wilford Corner  Delivered tPA to RN at 0708  tPA dose = 7.7mg  bolus over 1 minute followed by 69.6mg  for a total dose of 77.3mg  over 1 hour  tPA hung at 0725   Issues/delays encountered (if applicable): Patient was being transferred from [facility] and being directly admitted to IR. Had to wait on patient transport to IR before starting tPA   Vinnie Level, PharmD., BCPS, BCCCP Clinical Pharmacist Clinical phone for 03/24/20 until 3:30pm: [phone] If after 3:30pm, please refer to [facility] for unit-specific pharmacist

## 2020-03-24 NOTE — Consult Note (Addendum)
NAME:  Brad Delacruz, MRN:  [id number], DOB:  [birth year], LOS: 0 ADMISSION DATE:  03/24/2020, CONSULTATION DATE:  4/20  REFERRING MD:  deveshwar, CHIEF COMPLAINT:  Vent management    Brief History   59yo male with hx DM, HTN presented 4/20 to [facility] with sudden onset R sided weakness and aphasia.  SBP >200.  CT revealed dense L MCA occlusion.  He was tx urgently to [facility], IV tPA given and underwent IR revascularization of occluded L MCA.  Pt left intubated post procedure and PCCM consulted for vent and ICU management.   History of present illness   59yo male with hx DM, HTN presented 4/20 to [facility] with sudden onset R sided weakness and aphasia.  SBP >200.  CT revealed dense L MCA occlusion.  He was tx urgently to [facility], IV tPA given and underwent IR revascularization of occluded L MCA.  Pt left intubated post procedure and PCCM consulted for vent and ICU management.   Past Medical History   has a past medical history of Diabetes mellitus without complication ([place]).   Significant Hospital Events     Consults:  Stroke (Primary)   Procedures:  Neuro IR 4/20  Significant Diagnostic Tests:  CTA head 4/20>>> 1. Large left MCA territory nonhemorrhagic infarct. 2. Acute left M1 large vessel occlusion. 3. Large penumbra with mismatch volume of 99 mL. 4. Collateral vessels are present within the sylvian fissure.  Micro Data:    Antimicrobials:     Interim history/subjective:  Post neuro IR. Intubated on vent post procedure.   Objective   Blood pressure 107/68, pulse (!) 51, resp. rate 18, height 5\' 5"  (1.651 m), weight 85.9 kg, SpO2 100 %.    Vent Mode: PRVC FiO2 (%):  [100 %] 100 % Set Rate:  [18 bmp] 18 bmp Vt Set:  [490 mL] 490 mL PEEP:  [5 cmH20] 5 cmH20 Plateau Pressure:  [18 cmH20] 18 cmH20   Intake/Output Summary (Last 24 hours) at 03/24/2020 1141 Last data filed at 03/24/2020 1110 Gross per 24 hour  Intake 1550 ml  Output 25 ml  Net 1525 ml   Filed  Weights   03/24/20 0607  Weight: 85.9 kg    Examination: General: adult male, NAD sedated post procedure HENT: mm moist, ETT Lungs: resps even non labored on vent, few scattered rhonchi  Cardiovascular: s1s2 rrr sinus brady Abdomen: round, soft, +bs  Extremities: warm and dry, no edema  Neuro: sedated on vent post neuro IR procedure, RASS -3, BLE withdraws to pain otherwise minimal response    Resolved Hospital Problem list      Assessment & Plan:  Acute L MCA stroke - s/p IV tPA and IR revascularization  PLAN -  Per stroke team  BP control - Goal SBP 120-140 F/u imaging in am 24h post tPA   Acute respiratory failure - post neuro IR in setting L MCA stroke  PLAN -  Vent support - 8cc/kg  F/u CXR  F/u ABG SBT in am    Hx HTN - cleviprex gtt required on arrival Hypotension  PLAN -  Continue weaning Neo gtt for goal SBP 120-140 - wean off as able  Echo pending  If remains hypotensive may need to consider alternative sedation    Hx Type 2 DM  PLAN -  SSI  F/u HgbA1c    Best practice:  Diet: NPO Pain/Anxiety/Delirium protocol (if indicated): PAD protocol - propofol gtt VAP protocol (if indicated): ordered  DVT prophylaxis: SCD's  GI prophylaxis: pepcid Glucose control: SSI Mobility: BR Code Status: full Family Communication: daughter - updated per stroke team and neuro IR Disposition:   Labs   CBC: Recent Labs  Lab 03/24/20 0601 03/24/20 0613  WBC 5.4  --   NEUTROABS 3.0  --   HGB 14.6 13.9  HCT 40.4 41.0  MCV 88.4  --   PLT 120*  --     Basic Metabolic Panel: Recent Labs  Lab 03/24/20 0601 03/24/20 0613  NA 130* 133*  K 3.3* 3.3*  CL 94* 96*  CO2 26  --   GLUCOSE 309* 293*  BUN 20 20  CREATININE 0.94 0.90  CALCIUM 8.9  --    GFR: Estimated Creatinine Clearance: 89.1 mL/min (by C-G formula based on SCr of 0.9 mg/dL). Recent Labs  Lab 03/24/20 0601  WBC 5.4    Liver Function Tests: Recent Labs  Lab 03/24/20 0601  AST 21  ALT  27  ALKPHOS 90  BILITOT 0.7  PROT 6.5  ALBUMIN 3.8   No results for input(s): LIPASE, AMYLASE in the last 168 hours. No results for input(s): AMMONIA in the last 168 hours.  ABG    Component Value Date/Time   TCO2 27 03/24/2020 0613     Coagulation Profile: Recent Labs  Lab 03/24/20 0601  INR 1.0    Cardiac Enzymes: No results for input(s): CKTOTAL, CKMB, CKMBINDEX, TROPONINI in the last 168 hours.  HbA1C: No results found for: HGBA1C  CBG: Recent Labs  Lab 03/24/20 0605  GLUCAP 288*    Review of Systems:   Unable - pt sedated on vent. As per HPI above obtained from medical records and bedside team  Past Medical History  He,  has a past medical history of Diabetes mellitus without complication (HCC).   Surgical History   History reviewed. No pertinent surgical history.   Social History   reports that he has never smoked. He has never used smokeless tobacco. He reports previous alcohol use. He reports that he does not use drugs.   Family History   His family history is not on file.   Allergies No Known Allergies   Home Medications  Prior to Admission medications   Medication Sig Start Date End Date Taking? Authorizing Provider  ciclopirox (PENLAC) 8 % solution Apply topically as directed. 10/03/18   [provider]  dapagliflozin propanediol (FARXIGA) 10 MG TABS tablet Take 10 mg by mouth daily. 02/11/18   [provider]  losartan (COZAAR) 25 MG tablet Take 25 mg by mouth daily. 01/23/19   [provider]  pravastatin (PRAVACHOL) 10 MG tablet Take 10 mg by mouth daily. 09/07/18   [provider]  XIGDUO XR 09-999 MG TB24 Take 1 tablet by mouth daily. 01/24/19   [provider]     Critical care time: 35 mins     Dirk Dress, NP Pulmonary/Critical Care Medicine  03/24/2020  11:41 AM   PCCM attending:  59 year old admitted for acute left-sided MCA stroke status post IV TPA and IR revascularization.   Patient remained intubated on mechanical life support and pulmonary critical care was consulted for recommendations management for ventilator.  BP 121/63   Pulse 83   Temp 98.4 F (36.9 C) (Core)   Resp 18   Ht 5\' 5"  (1.651 m)   Wt 85.9 kg   SpO2 99%   BMI 31.51 kg/m   General: Hispanic male intubated mechanical life support Heart: Regular rhythm S1-S2 Lungs: Clear to auscultation bilaterally  no crackles no wheeze Neuro: He is able to move both lower extremities spontaneously, I was unable to get him to move his upper extremities.  Labs: Reviewed Chest x-ray: Reviewed  Assessment: Acute left-sided MCA stroke status post IV TPA and IR revascularization Acute hypoxemic respiratory failure secondary to inability to protect airway due to acute stroke as stated above. On mechanical ventilation  Plan: Remains on full mechanical vent support Continue plans for low tidal volume ventilation Ventilator associated pneumonia prophylaxis Continue to wean sedation as tolerated Keep blood pressure at goal, remains on IV Cleviprex Titrate to goal per interventional radiology Possible SBT tomorrow.  This patient is critically ill with multiple organ system failure; which, requires frequent high complexity decision making, assessment, support, evaluation, and titration of therapies. This was completed through the application of advanced monitoring technologies and extensive interpretation of multiple databases. During this encounter critical care time was devoted to patient care services described in this note for 32 minutes.  Josephine Igo, DO [place] [facility] Critical Care 03/24/2020 5:52 PM

## 2020-03-24 NOTE — Progress Notes (Signed)
Referring Physician(s): Code Stroke- Rory Percy, Ashish  Supervising Physician: Luanne Bras  Patient Status:  [facility] - In-pt  Chief Complaint: None- intubated with sedation  Subjective:  History of acute CVA s/p cerebral arteriogram with emergent mechanical thrombectomy and rescue stenting of left MCA M1 segment occlusion achieving a TICI 2C revascularization 03/24/2020 by Dr. Estanislado Pandy. Patient laying in bed intubated with sedation. He opens eyes to voice but quickly shuts them. Can spontaneously move lower extremities and LUE but no spontaneous movements of RUE. Right groin incision (access) and left groin incision (arterial line) sites c/d/i.   Allergies: Patient has no known allergies.  Medications: Prior to Admission medications   Medication Sig Start Date End Date Taking? Authorizing Provider  ciclopirox (PENLAC) 8 % solution Apply topically as directed. 10/03/18   [provider]  dapagliflozin propanediol (FARXIGA) 10 MG TABS tablet Take 10 mg by mouth daily. 02/11/18   [provider]  losartan (COZAAR) 25 MG tablet Take 25 mg by mouth daily. 01/23/19   [provider]  pravastatin (PRAVACHOL) 10 MG tablet Take 10 mg by mouth daily. 09/07/18   [provider]  XIGDUO XR 09-999 MG TB24 Take 1 tablet by mouth daily. 01/24/19   [provider]     Vital Signs: BP (!) 115/59   Pulse 79   Temp 98.4 F (36.9 C) (Core)   Resp 18   Ht 5\' 5"  (1.651 m)   Wt 189 lb 6 oz (85.9 kg)   SpO2 98%   BMI 31.51 kg/m   Physical Exam Vitals and nursing note reviewed.  Constitutional:      General: He is not in acute distress.    Comments: Intubated with sedation.  Pulmonary:     Effort: Pulmonary effort is normal. No respiratory distress.     Comments: Intubated with sedation. Skin:    General: Skin is warm and dry.     Comments: Right groin incision (access) site soft without active bleeding or hematoma; Left groin incision  (arterial line) site soft with sheath in place, no active bleeding or hematoma.  Neurological:     Comments: Intubated with sedation. He opens eyes to voice but quickly shuts them. Can spontaneously move lower extremities and LUE but no spontaneous movements of RUE. Distal pulses (DPs) 2+ bilaterally.     Imaging: CT Angio Head W or Wo Contrast  Result Date: 03/24/2020 CLINICAL DATA:  Patient found down at 5 a.m. Right-sided weakness. Abnormal speech. Facial droop. EXAM: CT ANGIOGRAPHY HEAD AND NECK CT PERFUSION BRAIN TECHNIQUE: Multidetector CT imaging of the head and neck was performed using the standard protocol during bolus administration of intravenous contrast. Multiplanar CT image reconstructions and MIPs were obtained to evaluate the vascular anatomy. Carotid stenosis measurements (when applicable) are obtained utilizing NASCET criteria, using the distal internal carotid diameter as the denominator. Multiphase CT imaging of the brain was performed following IV bolus contrast injection. Subsequent parametric perfusion maps were calculated using RAPID software. CONTRAST:  167mL OMNIPAQUE IOHEXOL 350 MG/ML SOLN COMPARISON:  CT head without contrast 03/24/2020 FINDINGS: CTA NECK FINDINGS Aortic arch: A 3 vessel arch configuration is present. Atherosclerotic calcifications are present at the aortic arch. No aneurysm or significant stenosis is present. Right carotid system: Right common carotid artery is within normal limits. Atherosclerotic changes are present at the bifurcation without significant stenosis. Study is mildly degraded by dental artifact. Left carotid system: The left common carotid artery is within normal limits. Atherosclerotic changes are present at  the proximal left ICA without a significant stenosis relative to the more distal vessel. Vertebral arteries: The right vertebral artery is the dominant vessel. Both vertebral arteries originate from the subclavian arteries without  significant stenosis. Is no significant stenosis of either vertebral artery in the neck. Skeleton: Degenerative changes are present cervical spine with slight reversal of normal cervical lordosis. No focal lytic or blastic lesions are present. Other neck: The soft tissues the neck are unremarkable. Upper chest: Mild dependent atelectasis is present. Thoracic inlet is normal. Review of the MIP images confirms the above findings CTA HEAD FINDINGS Anterior circulation: Internal carotid arteries are within normal limits through the ICA termini bilaterally. The A1 segments are normal bilaterally. The anterior communicating artery is patent. Right M1 segment is normal. Left M1 segment is occluded proximal to the bifurcation. Collateral vessels are evident within the sylvian fissure. Bilateral ACA and right MCA branch vessels are within normal limits. Posterior circulation: Internal carotid right vertebral artery is the dominant vessel. The non dominant left vertebral artery terminates at the PICA. Right vertebral artery becomes the basilar artery. Both posterior cerebral arteries originate from basilar tip. Prominent left posterior communicating artery is present. PCA branch vessels are within normal limits bilaterally. Venous sinuses: The dural sinuses are patent. The straight sinus deep cerebral veins are intact. Cortical veins are within normal limits. Anatomic variants: Prominent left posterior communicating artery. Review of the MIP images confirms the above findings CT Brain Perfusion Findings: ASPECTS: 10/10 CBF (<30%) Volume: 61mL Perfusion (Tmax>6.0s) volume: 16mL Mismatch Volume: 43mL Infarction Location:Left MCA IMPRESSION: 1. Large left MCA territory nonhemorrhagic infarct. 2. Acute left M1 large vessel occlusion. 3. Large penumbra with mismatch volume of 99 mL. 4. Collateral vessels are present within the sylvian fissure. 5. Atherosclerotic changes at the carotid bifurcations bilaterally without significant  stenosis. 6. Aortic Atherosclerosis (ICD10-I70.0). No significant stenosis at the arch. These results were called by telephone at the time of interpretation on 03/24/2020 at 6:13 am to provider [facility] , who verbally acknowledged these results. Electronically Signed   By: Marin Roberts M.D.   On: 03/24/2020 06:26   CT Angio Neck W and/or Wo Contrast  Result Date: 03/24/2020 CLINICAL DATA:  Patient found down at 5 a.m. Right-sided weakness. Abnormal speech. Facial droop. EXAM: CT ANGIOGRAPHY HEAD AND NECK CT PERFUSION BRAIN TECHNIQUE: Multidetector CT imaging of the head and neck was performed using the standard protocol during bolus administration of intravenous contrast. Multiplanar CT image reconstructions and MIPs were obtained to evaluate the vascular anatomy. Carotid stenosis measurements (when applicable) are obtained utilizing NASCET criteria, using the distal internal carotid diameter as the denominator. Multiphase CT imaging of the brain was performed following IV bolus contrast injection. Subsequent parametric perfusion maps were calculated using RAPID software. CONTRAST:  OMNIPAQUE IOHEXOL 350 MG/ML SOLN COMPARISON:  CT head without contrast 03/24/2020 FINDINGS: CTA NECK FINDINGS Aortic arch: A 3 vessel arch configuration is present. Atherosclerotic calcifications are present at the aortic arch. No aneurysm or significant stenosis is present. Right carotid system: Right common carotid artery is within normal limits. Atherosclerotic changes are present at the bifurcation without significant stenosis. Study is mildly degraded by dental artifact. Left carotid system: The left common carotid artery is within normal limits. Atherosclerotic changes are present at the proximal left ICA without a significant stenosis relative to the more distal vessel. Vertebral arteries: The right vertebral artery is the dominant vessel. Both vertebral arteries originate from the subclavian arteries  without significant stenosis. Is  no significant stenosis of either vertebral artery in the neck. Skeleton: Degenerative changes are present cervical spine with slight reversal of normal cervical lordosis. No focal lytic or blastic lesions are present. Other neck: The soft tissues the neck are unremarkable. Upper chest: Mild dependent atelectasis is present. Thoracic inlet is normal. Review of the MIP images confirms the above findings CTA HEAD FINDINGS Anterior circulation: Internal carotid arteries are within normal limits through the ICA termini bilaterally. The A1 segments are normal bilaterally. The anterior communicating artery is patent. Right M1 segment is normal. Left M1 segment is occluded proximal to the bifurcation. Collateral vessels are evident within the sylvian fissure. Bilateral ACA and right MCA branch vessels are within normal limits. Posterior circulation: Internal carotid right vertebral artery is the dominant vessel. The non dominant left vertebral artery terminates at the PICA. Right vertebral artery becomes the basilar artery. Both posterior cerebral arteries originate from basilar tip. Prominent left posterior communicating artery is present. PCA branch vessels are within normal limits bilaterally. Venous sinuses: The dural sinuses are patent. The straight sinus deep cerebral veins are intact. Cortical veins are within normal limits. Anatomic variants: Prominent left posterior communicating artery. Review of the MIP images confirms the above findings CT Brain Perfusion Findings: ASPECTS: 10/10 CBF (<30%) Volume: 83mL Perfusion (Tmax>6.0s) volume: 82mL Mismatch Volume: 99mL Infarction Location:Left MCA IMPRESSION: 1. Large left MCA territory nonhemorrhagic infarct. 2. Acute left M1 large vessel occlusion. 3. Large penumbra with mismatch volume of 99 mL. 4. Collateral vessels are present within the sylvian fissure. 5. Atherosclerotic changes at the carotid bifurcations bilaterally without  significant stenosis. 6. Aortic Atherosclerosis (ICD10-I70.0). No significant stenosis at the arch. These results were called by telephone at the time of interpretation on 03/24/2020 at 6:13 am to provider [facility] , who verbally acknowledged these results. Electronically Signed   By: Marin Roberts M.D.   On: 03/24/2020 06:26   CT CEREBRAL PERFUSION W CONTRAST  Result Date: 03/24/2020 CLINICAL DATA:  Patient found down at 5 a.m. Right-sided weakness. Abnormal speech. Facial droop. EXAM: CT ANGIOGRAPHY HEAD AND NECK CT PERFUSION BRAIN TECHNIQUE: Multidetector CT imaging of the head and neck was performed using the standard protocol during bolus administration of intravenous contrast. Multiplanar CT image reconstructions and MIPs were obtained to evaluate the vascular anatomy. Carotid stenosis measurements (when applicable) are obtained utilizing NASCET criteria, using the distal internal carotid diameter as the denominator. Multiphase CT imaging of the brain was performed following IV bolus contrast injection. Subsequent parametric perfusion maps were calculated using RAPID software. CONTRAST:  OMNIPAQUE IOHEXOL 350 MG/ML SOLN COMPARISON:  CT head without contrast 03/24/2020 FINDINGS: CTA NECK FINDINGS Aortic arch: A 3 vessel arch configuration is present. Atherosclerotic calcifications are present at the aortic arch. No aneurysm or significant stenosis is present. Right carotid system: Right common carotid artery is within normal limits. Atherosclerotic changes are present at the bifurcation without significant stenosis. Study is mildly degraded by dental artifact. Left carotid system: The left common carotid artery is within normal limits. Atherosclerotic changes are present at the proximal left ICA without a significant stenosis relative to the more distal vessel. Vertebral arteries: The right vertebral artery is the dominant vessel. Both vertebral arteries originate from the subclavian  arteries without significant stenosis. Is no significant stenosis of either vertebral artery in the neck. Skeleton: Degenerative changes are present cervical spine with slight reversal of normal cervical lordosis. No focal lytic or blastic lesions are present. Other neck: The soft tissues the neck  are unremarkable. Upper chest: Mild dependent atelectasis is present. Thoracic inlet is normal. Review of the MIP images confirms the above findings CTA HEAD FINDINGS Anterior circulation: Internal carotid arteries are within normal limits through the ICA termini bilaterally. The A1 segments are normal bilaterally. The anterior communicating artery is patent. Right M1 segment is normal. Left M1 segment is occluded proximal to the bifurcation. Collateral vessels are evident within the sylvian fissure. Bilateral ACA and right MCA branch vessels are within normal limits. Posterior circulation: Internal carotid right vertebral artery is the dominant vessel. The non dominant left vertebral artery terminates at the PICA. Right vertebral artery becomes the basilar artery. Both posterior cerebral arteries originate from basilar tip. Prominent left posterior communicating artery is present. PCA branch vessels are within normal limits bilaterally. Venous sinuses: The dural sinuses are patent. The straight sinus deep cerebral veins are intact. Cortical veins are within normal limits. Anatomic variants: Prominent left posterior communicating artery. Review of the MIP images confirms the above findings CT Brain Perfusion Findings: ASPECTS: 10/10 CBF (<30%) Volume: 83mL Perfusion (Tmax>6.0s) volume: 82mL Mismatch Volume: 99mL Infarction Location:Left MCA IMPRESSION: 1. Large left MCA territory nonhemorrhagic infarct. 2. Acute left M1 large vessel occlusion. 3. Large penumbra with mismatch volume of 99 mL. 4. Collateral vessels are present within the sylvian fissure. 5. Atherosclerotic changes at the carotid bifurcations bilaterally  without significant stenosis. 6. Aortic Atherosclerosis (ICD10-I70.0). No significant stenosis at the arch. These results were called by telephone at the time of interpretation on 03/24/2020 at 6:13 am to provider [facility] , who verbally acknowledged these results. Electronically Signed   By: Marin Robertshristopher  Mattern M.D.   On: 03/24/2020 06:26   ECHOCARDIOGRAM COMPLETE  Result Date: 03/24/2020    ECHOCARDIOGRAM REPORT   Patient Name:   Signe ColtLEOPOLDO Delacruz Date of Exam: 03/24/2020 Medical Rec #:  [id number]        Height:       65.0 in Accession #:    [id number]       Weight:       189.4 lb Date of Birth:  Dec 13, 1960       BSA:          1.933 m Patient Age:    58 years         BP:           119/53 mmHg Patient Gender: M                HR:           56 bpm. Exam Location:  Inpatient Procedure: 2D Echo, Cardiac Doppler and Color Doppler Indications:    Stroke 434.91 / I163.9  History:        Patient has no prior history of Echocardiogram examinations.                 Risk Factors:Diabetes.  Sonographer:    Elmarie Shileyiffany Dance Referring Phys: [id number] ASHISH ARORA IMPRESSIONS  1. Normal LV systolic function; grade 1 diastolic dysfunction.  2. Left ventricular ejection fraction, by estimation, is 55 to 60%. The left ventricle has normal function. The left ventricle has no regional wall motion abnormalities. Left ventricular diastolic parameters are consistent with Grade I diastolic dysfunction (impaired relaxation).  3. Right ventricular systolic function is normal. The right ventricular size is normal.  4. The mitral valve is normal in structure. No evidence of mitral valve regurgitation. No evidence of mitral stenosis.  5. The aortic valve is tricuspid. Aortic valve regurgitation is not  visualized. Mild aortic valve sclerosis is present, with no evidence of aortic valve stenosis. FINDINGS  Left Ventricle: Left ventricular ejection fraction, by estimation, is 55 to 60%. The left ventricle has normal function. The left  ventricle has no regional wall motion abnormalities. The left ventricular internal cavity size was normal in size. There is  no left ventricular hypertrophy. Left ventricular diastolic parameters are consistent with Grade I diastolic dysfunction (impaired relaxation). Right Ventricle: The right ventricular size is normal. Right ventricular systolic function is normal. Left Atrium: Left atrial size was normal in size. Right Atrium: Right atrial size was normal in size. Pericardium: There is no evidence of pericardial effusion. Mitral Valve: The mitral valve is normal in structure. Normal mobility of the mitral valve leaflets. No evidence of mitral valve regurgitation. No evidence of mitral valve stenosis. Tricuspid Valve: The tricuspid valve is normal in structure. Tricuspid valve regurgitation is not demonstrated. No evidence of tricuspid stenosis. Aortic Valve: The aortic valve is tricuspid. Aortic valve regurgitation is not visualized. Mild aortic valve sclerosis is present, with no evidence of aortic valve stenosis. Pulmonic Valve: The pulmonic valve was normal in structure. Pulmonic valve regurgitation is not visualized. No evidence of pulmonic stenosis. Aorta: The aortic root is normal in size and structure. Venous: The inferior vena cava was not well visualized.  Additional Comments: Normal LV systolic function; grade 1 diastolic dysfunction.  LEFT VENTRICLE PLAX 2D LVIDd:         3.09 cm  Diastology LVIDs:         2.86 cm  LV e' lateral:   4.57 cm/s LV PW:         0.99 cm  LV E/e' lateral: 9.2 LV IVS:        0.94 cm  LV e' medial:    4.90 cm/s LVOT diam:     2.10 cm  LV E/e' medial:  8.6 LV SV:         62 LV SV Index:   32 LVOT Area:     3.46 cm  RIGHT VENTRICLE             IVC RV Basal diam:  2.12 cm     IVC diam: 2.18 cm RV S prime:     10.20 cm/s TAPSE (M-mode): 2.1 cm LEFT ATRIUM             Index       RIGHT ATRIUM           Index LA diam:        3.10 cm 1.60 cm/m  RA Area:     10.60 cm LA Vol (A2C):    35.2 ml 18.21 ml/m RA Volume:   17.40 ml  9.00 ml/m LA Vol (A4C):   35.0 ml 18.11 ml/m LA Biplane Vol: 35.2 ml 18.21 ml/m  AORTIC VALVE LVOT Vmax:   79.10 cm/s LVOT Vmean:  47.100 cm/s LVOT VTI:    0.180 m  AORTA Ao Root diam: 3.50 cm Ao Asc diam:  3.45 cm MITRAL VALVE MV Area (PHT): 1.78 cm    SHUNTS MV Decel Time: 426 msec    Systemic VTI:  0.18 m MV E velocity: 42.20 cm/s  Systemic Diam: 2.10 cm MV A velocity: 45.60 cm/s MV E/A ratio:  0.93 Olga Millers MD Electronically signed by Olga Millers MD Signature Date/Time: 03/24/2020/2:36:05 PM    Final    CT HEAD CODE STROKE WO CONTRAST  Result Date: 03/24/2020 CLINICAL DATA:  Code stroke. Ataxia. Right-sided  weakness. Abnormal speech. Facial droop. Symptoms began at 5 a.m. EXAM: CT HEAD WITHOUT CONTRAST TECHNIQUE: Contiguous axial images were obtained from the base of the skull through the vertex without intravenous contrast. COMPARISON:  None. FINDINGS: Brain: No acute infarct, hemorrhage, or mass lesion is present. Remote infarct is present in the left corona radiata and anterior limb internal capsule. The basal ganglia are intact. Insular ribbon is normal. No acute hemorrhage or mass lesion is present. The ventricles are of normal size. No significant extraaxial fluid collection is present. The brainstem and cerebellum are within normal limits. Vascular: No hyperdense vessel or unexpected calcification. Skull: Calvarium is intact. No focal lytic or blastic lesions are present. No significant extracranial soft tissue lesion is present. Sinuses/Orbits: The paranasal sinuses and mastoid air cells are clear. The globes and orbits are within normal limits. ASPECTS [facility] Stroke Program Early CT Score) - Ganglionic level infarction (caudate, lentiform nuclei, internal capsule, insula, M1-M3 cortex): 7/7 - Supraganglionic infarction (M4-M6 cortex): 3/3 Total score (0-10 with 10 being normal): 10/10 IMPRESSION: 1. No acute intracranial abnormality. 2.  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 3. ASPECTS is 10/10 These results were called by telephone at the time of interpretation on 03/24/2020 at 5:46 am to provider [facility] , who verbally acknowledged these results. Electronically Signed   By: Marin Roberts M.D.   On: 03/24/2020 05:46    Labs:  CBC: Recent Labs    03/24/20 0601 03/24/20 0613 03/24/20 1209  WBC 5.4  --   --   HGB 14.6 13.9 11.9*  HCT 40.4 41.0 35.0*  PLT 120*  --   --     COAGS: Recent Labs    03/24/20 0601  INR 1.0  APTT 24    BMP: Recent Labs    03/24/20 0601 03/24/20 0613 03/24/20 1209  NA 130* 133* 136  K 3.3* 3.3* 4.1  CL 94* 96*  --   CO2 26  --   --   GLUCOSE 309* 293*  --   BUN 20 20  --   CALCIUM 8.9  --   --   CREATININE 0.94 0.90  --   GFRNONAA >60  --   --   GFRAA >60  --   --     LIVER FUNCTION TESTS: Recent Labs    03/24/20 0601  BILITOT 0.7  AST 21  ALT 27  ALKPHOS 90  PROT 6.5  ALBUMIN 3.8    Assessment and Plan:  History of acute CVA s/p cerebral arteriogram with emergent mechanical thrombectomy and rescue stenting of left MCA M1 segment occlusion achieving a TICI 2C revascularization 03/24/2020 by Dr. Corliss Skains. Patient's condition stable- remains intubated/sedated, opens eyes to voice but quickly shuts them; can spontaneously move lower extremities and LUE but no spontaneous movements of RUE. Right groin incision (access) stable, left groin incision (arterial line) stable with sheath in place- distal pulses (DPs) 2+ bilaterally. Further plans per neurology/CCM- appreciate and agree with management. NIR to follow.   Electronically Signed: Elwin Mocha, PA-C 03/24/2020, 4:02 PM   I spent a total of 25 Minutes at the the patient's bedside AND on the patient's hospital floor or unit, greater than 50% of which was counseling/coordinating care for CVA s/p revascularization.

## 2020-03-24 NOTE — Anesthesia Procedure Notes (Signed)
Arterial Line Insertion Start/End4/20/2021 7:49 AM, 03/24/2020 7:49 AM Performed by: Dorie Rank, CRNA, CRNA  Preanesthetic checklist: patient identified, IV checked, risks and benefits discussed, surgical consent, pre-op evaluation and timeout performed Lidocaine 1% used for infiltration radial was placed Catheter size: 20 G Hand hygiene performed , maximum sterile barriers used  and Seldinger technique used Allen's test indicative of satisfactory collateral circulation Attempts: 1 Procedure performed without using ultrasound guided technique. Following insertion, Biopatch and dressing applied. Post procedure assessment: normal  Patient tolerated the procedure well with no immediate complications.

## 2020-03-24 NOTE — Progress Notes (Signed)
Pt was transported to CT scan and back without complications.  

## 2020-03-24 NOTE — Progress Notes (Signed)
  Echocardiogram 2D Echocardiogram has been attempted. Nurse stated to return at later time due to patient condition.  Brad Delacruz 03/24/2020, 11:31 AM

## 2020-03-24 NOTE — ED Notes (Signed)
EMS here to transport pt to [facility] ED

## 2020-03-24 NOTE — Anesthesia Preprocedure Evaluation (Signed)
Anesthesia Evaluation  Patient identified by MRN, date of birth, ID band Patient awake    Reviewed: Allergy & Precautions, Patient's Chart, lab work & pertinent test resultsPreop documentation limited or incomplete due to emergent nature of procedure.  Airway Mallampati: II  TM Distance: >3 FB     Dental   Pulmonary    breath sounds clear to auscultation       Cardiovascular  Rhythm:Regular Rate:Normal     Neuro/Psych    GI/Hepatic   Endo/Other  diabetes  Renal/GU      Musculoskeletal   Abdominal   Peds  Hematology   Anesthesia Other Findings   Reproductive/Obstetrics                             Anesthesia Physical Anesthesia Plan  ASA: III  Anesthesia Plan: General   Post-op Pain Management:    Induction: Intravenous  PONV Risk Score and Plan: 2 and Ondansetron  Airway Management Planned: Oral ETT  Additional Equipment:   Intra-op Plan:   Post-operative Plan: Post-operative intubation/ventilation  Informed Consent:   Plan Discussed with: CRNA and Anesthesiologist  Anesthesia Plan Comments:         Anesthesia Quick Evaluation

## 2020-03-24 NOTE — H&P (Signed)
STROKE H&P   CC: Right-sided weakness, aphasia  History is obtained from: Chart review, family over the phone, handoff from the outgoing service to whom the consult was called  HPI: Brad Delacruz is a 59 y.o. male past medical history of diabetes, hypertension which she does not take medication for, presented to the emergency room at [facility] with last known well of 5 AM when he was at work and was noted to have a sudden onset of right-sided weakness and inability to talk.  He was evaluated by telemedicine neurology.  NIH stroke scale initially was noted to be 28.  CT head with hyperdense left MCA and CT angiogram with left M1 occlusion.  Exam consistent with left MCA syndrome.  CT perfusion study was done but he was within 6 hours-it showed a large code of 27 cc-Case was discussed with the interventionalists by the outgoing on-call neurologist and decision was made to offer thrombectomy. Patient was not given IV TPA at [facility] because they could not get history of whether he was on anticoagulation or not.  In the time that the patient was being transferred to [facility], daughter was able to be contacted, who was not sure if he is on anticoagulation.  She took some time and found out by the way of patient's wife that he is only taking medications for diabetes, does not take medications for his high blood pressure, and is not on any anticoagulation. As soon as the patient arrived to [facility], he was evaluated on the bridge, see detailed exam below.  Exam continues to be consistent with left MCA syndrome.  Systolic blood pressure were in the 210s.  Required 1 dose of labetalol IV and starting Cleviprex before bringing the blood pressure down to goal to be able to give IV TPA.  Risks and benefits of IV TPA as well as thrombectomy were discussed with the daughter over the phone who provided verbal consent for IV TPA and witnessed consent by RN for the  procedure. The patient was given IV TPA in the interventional radiology suite intubation bay prior to intubating the patient.    LKW: 0500 hrs. on 03/24/2020 tpa given?:  Yes Premorbid modified Rankin scale (mRS): 0  ROS: Unable to perform due to patient's aphasia  Past Medical History:  Diagnosis Date  . Diabetes mellitus without complication (HCC)     History reviewed. No pertinent family history.  Unable to obtain family history or social history due to patient's aphasia  Social History:   reports that he has never smoked. He has never used smokeless tobacco. He reports previous alcohol use. He reports that he does not use drugs.  Medications  Current Facility-Administered Medications:  .   stroke: mapping our early stages of recovery book, , Does not apply, Once, Milon Dikes, MD .  alteplase (ACTIVASE) 1 mg/mL infusion 77.3 mg, 0.9 mg/kg, Intravenous, Once **FOLLOWED BY** 0.9 %  sodium chloride infusion, 50 mL, Intravenous, Once, Milon Dikes, MD .  0.9 %  sodium chloride infusion, , Intravenous, Continuous, Milon Dikes, MD .  acetaminophen (TYLENOL) tablet 650 mg, 650 mg, Oral, Q4H PRN **OR** acetaminophen (TYLENOL) 160 MG/5ML solution 650 mg, 650 mg, Per Tube, Q4H PRN **OR** acetaminophen (TYLENOL) suppository 650 mg, 650 mg, Rectal, Q4H PRN, Milon Dikes, MD .  aspirin 81 MG chewable tablet, , , ,  .  ceFAZolin (ANCEF) 2-4 GM/100ML-% IVPB, , , ,  .  clevidipine (CLEVIPREX) infusion 0.5 mg/mL, 0-21  mg/hr, Intravenous, Continuous, Amie Portland, MD .  clopidogrel (PLAVIX) 300 MG tablet, , , ,  .  eptifibatide (INTEGRILIN) 20 MG/10ML injection, , , ,  .  iohexol (OMNIPAQUE) 240 MG/ML injection, , , ,  .  nitroGLYCERIN 100 mcg/mL intra-arterial injection, , , ,  .  pantoprazole (PROTONIX) injection 40 mg, 40 mg, Intravenous, QHS, Amie Portland, MD .  senna-docusate (Senokot-S) tablet 1 tablet, 1 tablet, Oral, QHS PRN, Amie Portland, MD .  ticagrelor (BRILINTA) 90 MG  tablet, , , ,  .  tirofiban (AGGRASTAT) 5-0.9 MG/100ML-% injection, , , ,  .  verapamil (ISOPTIN) 2.5 MG/ML injection, , , ,    Exam: Current vital signs: BP (!) 177/95   Pulse 78   Resp (!) 23   Ht 5\' 5"  (1.651 m)   Wt 85.9 kg   SpO2 97%   BMI 31.51 kg/m  Vital signs in last 24 hours: Pulse Rate:  [77-84] 78 (04/20 0630) Resp:  [17-35] 23 (04/20 0630) BP: (166-186)/(85-100) 177/95 (04/20 0630) SpO2:  [91 %-99 %] 97 % (04/20 0630) Weight:  [85.9 kg] 85.9 kg (04/20 0607) General: Awake in no distress HEENT: Normocephalic atraumatic Lungs: Clear CVS: Regular rate rhythm Abdomen nondistended nontender Extremities warm well perfused Neurological exam He is awake, alert. He is unable to follow commands He is mute Cranial nerves: Pupils are equal round reactive to light, has a left gaze preference with better movement of the eyes towards midline, cannot cross midline, right homonymous hemianopsia, right lower facial weakness Motor exam: Flaccid right upper and normal tone and weak 1-2/5 right lower extremity.  Left upper and lower extremity are 5/5. Sensory exam: Diminished to painful stimulus on the right in comparison to the left based on withdrawal. Unable to assess ataxia due to his inability to follow commands NIH stroke scale 1a Level of Conscious.: 0 1b LOC Questions: 2 1c LOC Commands: 2 2 Best Gaze: 1 3 Visual: 2 4 Facial Palsy: 2 5a Motor Arm - left: 0 5b Motor Arm - Right: 4 6a Motor Leg - Left: 0 6b Motor Leg - Right: 3 7 Limb Ataxia: 0 8 Sensory: 2 9 Best Language: 3 10 Dysarthria: 2 11 Extinct. and Inatten.:0  TOTAL: 23  Labs I have reviewed labs in epic and the results pertinent to this consultation are:  CBC    Component Value Date/Time   WBC 5.4 03/24/2020 0601   RBC 4.57 03/24/2020 0601   HGB 13.9 03/24/2020 0613   HCT 41.0 03/24/2020 0613   PLT 120 (L) 03/24/2020 0601   MCV 88.4 03/24/2020 0601   MCH 31.9 03/24/2020 0601   MCHC 36.1 (H)  03/24/2020 0601   RDW 12.4 03/24/2020 0601   LYMPHSABS 1.7 03/24/2020 0601   MONOABS 0.6 03/24/2020 0601   EOSABS 0.1 03/24/2020 0601   BASOSABS 0.1 03/24/2020 0601    CMP     Component Value Date/Time   NA 133 (L) 03/24/2020 0613   K 3.3 (L) 03/24/2020 0613   CL 96 (L) 03/24/2020 0613   CO2 26 03/24/2020 0601   GLUCOSE 293 (H) 03/24/2020 0613   BUN 20 03/24/2020 0613   CREATININE 0.90 03/24/2020 0613   CALCIUM 8.9 03/24/2020 0601   PROT 6.5 03/24/2020 0601   ALBUMIN 3.8 03/24/2020 0601   AST 21 03/24/2020 0601   ALT 27 03/24/2020 0601   ALKPHOS 90 03/24/2020 0601   BILITOT 0.7 03/24/2020 0601   GFRNONAA >60 03/24/2020 0601   GFRAA >60 03/24/2020 0601  Imaging I have reviewed the images obtained:  CT-scan of the brain-ASPECT 10, no bleed CTA H+N- Lt M1 occlusion.  CTP- 88cc core, mismatch 99cc  Assessment: 59 year old with past medical history of diabetes and hypertension with sudden onset of right-sided weakness and aphasia and examination consistent with a left MCA syndrome.  Imaging also suggestive of a left M1 occlusion. Patient initially presented to [facility], examined by telemedicine neurology and ED providers at that hospital.  Unable to offer him TPA at that point due to inability to gather history of anticoagulation.  Later on, while he was being transported to [facility], we were able to get hold of family who confirmed that he is not on anticoagulation. He was given IV TPA upon arrival at [name] after bring his blood pressures down to goal-requiring labetalol and Cleviprex. He was then taken in for mechanical thrombectomy where he is currently.  Plan:  Acute Ischemic Stroke Cerebral infarction due to embolism of left middle cerebral artery   Acuity: Acute Current Suspected Etiology: under investigation Continue Evaluation:  -Admit to: Neurological ICU -Hold Aspirin until 24 hour post tPA neuroimaging is stable and without  evidence of bleeding -Blood pressure control, goal of SYS <180 -lower if successfully revascularized-pending IR procedure -MRI/ECHO/A1C/Lipid panel. -Hyperglycemia management per SSI to maintain glucose 140-180mg /dL. -PT/OT/ST therapies and recommendations when able  CNS -Close neuro monitoring  Dysarthria Dysphagia following cerebral infarction  -NPO until cleared by speech -Speech therapy consult  Hemiplegia and hemiparesis following cerebral infarction affecting right dominant side -PT/OT -PM&R consult  RESP Acute Respiratory Failure -likely due to stroke/possible aspiration pneumonia -vent management per anesthesia and the procedure in ICU after the procedure of unsuccessful in intubating -wean when able  CV Essential (primary) hypertension Hypertensive Emergency -Aggressive BP control, goal SBP < 180 according to the post TPA parameters-lower if successfully revascularized-pending IR procedure -TTE  Hyperlipidemia, unspecified  - Statin for goal LDL < 70  HEME Thrombocytopenia-platelet count 120,000.  Not a contraindication for IV TPA. Given IV TPA -Monitor labs -transfuse for hgb < 7  ENDO Type 2 diabetes mellitus with hyperglycemia  -SSI -We will appreciate PCCM consultation for medical management in addition to vent management -goal HgbA1c < 7  GI/GU No active issues -Gentle hydration-normal saline 75 cc an hour  Fluid/Electrolyte Disorders Mild hyponatremia -Continue normal saline -Monitor labs and replete as necessary  ID Possible Aspiration PNA -CXR -NPO -Monitor-no antibiotics for now.   Nutrition E66.9 Obesity  -diet consult  Prophylaxis DVT: SCDs GI: PPI Bowel: Docusate and senna  Diet: NPO until cleared by speech  Code Status: Full Code     THE FOLLOWING WERE PRESENT ON ADMISSION: Acute ischemic stroke Hypertensive emergency Possible aspiration pneumonia Respiratory failure due to possible aspiration pneumonia -- Milon Dikes, MD [facility] Pager: [phone] If 7pm to 7am, please call on call as listed on AMION.   CRITICAL CARE ATTESTATION Performed by: Milon Dikes, MD Total critical care time: 55 minutes Critical care time was exclusive of separately billable procedures and treating other patients and/or supervising APPs/Residents/Students Critical care was necessary to treat or prevent imminent or life-threatening deterioration due to acute ischemic stroke, IV thrombolysis, evaluation for thrombectomy  This patient is critically ill and at significant risk for neurological worsening and/or death and care requires constant monitoring. Critical care was time spent personally by me on the following activities: development of treatment plan with patient and/or surrogate as well as nursing, discussions with consultants, evaluation  of patient's response to treatment, examination of patient, obtaining history from patient or surrogate, ordering and performing treatments and interventions, ordering and review of laboratory studies, ordering and review of radiographic studies, pulse oximetry, re-evaluation of patient's condition, participation in multidisciplinary rounds and medical decision making of high complexity in the care of this patient.

## 2020-03-24 NOTE — Anesthesia Postprocedure Evaluation (Signed)
Anesthesia Post Note  Patient: [employer]  Procedure(s) Performed: IR WITH ANESTHESIA (N/A )     Patient location during evaluation: PACU Anesthesia Type: General Level of consciousness: awake Pain management: pain level controlled Respiratory status: spontaneous breathing Cardiovascular status: stable Postop Assessment: no apparent nausea or vomiting Anesthetic complications: no    Last Vitals:  Vitals:   03/24/20 1130 03/24/20 1135  BP:    Pulse: (!) 53 (!) 51  Resp: 12 18  Temp:  (!) 34.4 C  SpO2: 100% 100%    Last Pain:  Vitals:   03/24/20 1135  TempSrc: Oral                 Jakai Onofre

## 2020-03-24 NOTE — Progress Notes (Signed)
SLP Cancellation Note  Patient Details Name: Dayveon Halley MRN: [id number] DOB: [birth year]   Cancelled treatment:        Pt on vent. Will follow for speech-language-cognitive assessment.    Royce Macadamia 03/24/2020, 3:06 PM  Breck Coons Lonell Face.Ed Nurse, children's [phone] Office [phone]

## 2020-03-24 NOTE — Code Documentation (Signed)
Stroke Response Nurse Documentation Code Documentation  Brad Delacruz is a 59 y.o. male arriving to [facility] ED via EMS on 03/24/20 with past medical history of HTN, DM, hyperlipidemia. Code stroke was activated by EMS and patient evaluated at [facility] ED. Patient had fallen at work this morning and was noted to be confused. LKW at 0500 and now complaining of global aphasia and right sided weakness. NIH at [facility] 33 and VAN positive. The following imaging was completed at [facility]: CT, CTA head and neck, CTP. Left M1 occlusion on scan. Patient is a candidate for tPA. Not given at [facility] as anticoagulation and PMH unknown.  Dr. Wilford Corner verified over the phone with daughter the patient is not on anticoagulants and obtained constent with Dr. Otelia Limes for IR. Stroke team in ED on patient arrival. BP 200/97- 10 mg labetalol given 0716. BP 197/100 0719- cleviprex started at 0723. tpa given 0725. BP 184/103 0726- cleviprex increased 0727. Gtt turned off 0730 to allow anesthesia to intubated with meds.     NIHSS 28 at [facility], see documentation for details and code stroke times. Patient with decreased LOC, disoriented, not following commands, left gaze preference , right hemianopia, right facial droop, right arm weakness, bilateral leg weakness, right decreased sensation, Global aphasia , dysarthria  and Visual  neglect on exam. Able to move both legs, right leg weaker than left. Unable to follow instructions to assess motor per NIHSS. Care/Plan follow tpa protocol for VS and mNIHSS assessments. Admit to ICU post IR. Bedside handoff with IR RN Ronaldo Miyamoto.    Ferman Hamming Stroke Response RN

## 2020-03-24 NOTE — Progress Notes (Signed)
  Echocardiogram 2D Echocardiogram has been performed.  Bich Mchaney G Jazz Biddy 03/24/2020, 1:18 PM

## 2020-03-24 NOTE — Transfer of Care (Signed)
Immediate Anesthesia Transfer of Care Note  Patient: [facility]  Procedure(s) Performed: IR WITH ANESTHESIA (N/A )  Patient Location: ICU  Anesthesia Type:General  Level of Consciousness: Patient remains intubated per anesthesia plan  Airway & Oxygen Therapy: Patient remains intubated per anesthesia plan and Patient placed on Ventilator (see vital sign flow sheet for setting)  Post-op Assessment: Report given to RN and Post -op Vital signs reviewed and stable  Post vital signs: Reviewed and stable  Last Vitals:  Vitals Value Taken Time  BP 120/87   Temp    Pulse 53 03/24/20 1132  Resp 18 03/24/20 1132  SpO2 100 % 03/24/20 1132  Vitals shown include unvalidated device data.  Last Pain: There were no vitals filed for this visit.       Complications: No apparent anesthesia complications

## 2020-03-24 NOTE — ED Notes (Signed)
CBG 334 for EMS in route.

## 2020-03-25 ENCOUNTER — Inpatient Hospital Stay (HOSPITAL_COMMUNITY): Payer: BC Managed Care – PPO

## 2020-03-25 ENCOUNTER — Encounter: Payer: Self-pay | Admitting: *Deleted

## 2020-03-25 DIAGNOSIS — E781 Pure hyperglyceridemia: Secondary | ICD-10-CM | POA: Diagnosis not present

## 2020-03-25 DIAGNOSIS — J9602 Acute respiratory failure with hypercapnia: Secondary | ICD-10-CM

## 2020-03-25 DIAGNOSIS — E1165 Type 2 diabetes mellitus with hyperglycemia: Secondary | ICD-10-CM

## 2020-03-25 DIAGNOSIS — Z9282 Status post administration of tPA (rtPA) in a different facility within the last 24 hours prior to admission to current facility: Secondary | ICD-10-CM

## 2020-03-25 DIAGNOSIS — I63412 Cerebral infarction due to embolism of left middle cerebral artery: Secondary | ICD-10-CM | POA: Diagnosis not present

## 2020-03-25 DIAGNOSIS — G936 Cerebral edema: Secondary | ICD-10-CM | POA: Diagnosis not present

## 2020-03-25 DIAGNOSIS — I63512 Cerebral infarction due to unspecified occlusion or stenosis of left middle cerebral artery: Secondary | ICD-10-CM

## 2020-03-25 DIAGNOSIS — R1312 Dysphagia, oropharyngeal phase: Secondary | ICD-10-CM

## 2020-03-25 DIAGNOSIS — J9600 Acute respiratory failure, unspecified whether with hypoxia or hypercapnia: Secondary | ICD-10-CM | POA: Diagnosis not present

## 2020-03-25 DIAGNOSIS — I639 Cerebral infarction, unspecified: Secondary | ICD-10-CM | POA: Diagnosis not present

## 2020-03-25 DIAGNOSIS — J9601 Acute respiratory failure with hypoxia: Secondary | ICD-10-CM | POA: Diagnosis not present

## 2020-03-25 DIAGNOSIS — I6602 Occlusion and stenosis of left middle cerebral artery: Secondary | ICD-10-CM | POA: Diagnosis not present

## 2020-03-25 HISTORY — PX: IR PATIENT EVAL TECH 0-60 MINS: IMG5564

## 2020-03-25 LAB — GLUCOSE, CAPILLARY
Glucose-Capillary: 101 mg/dL — ABNORMAL HIGH (ref 70–99)
Glucose-Capillary: 142 mg/dL — ABNORMAL HIGH (ref 70–99)
Glucose-Capillary: 160 mg/dL — ABNORMAL HIGH (ref 70–99)
Glucose-Capillary: 165 mg/dL — ABNORMAL HIGH (ref 70–99)
Glucose-Capillary: 169 mg/dL — ABNORMAL HIGH (ref 70–99)
Glucose-Capillary: 180 mg/dL — ABNORMAL HIGH (ref 70–99)
Glucose-Capillary: 220 mg/dL — ABNORMAL HIGH (ref 70–99)

## 2020-03-25 LAB — CBC WITH DIFFERENTIAL/PLATELET
Abs Immature Granulocytes: 0.03 10*3/uL (ref 0.00–0.07)
Basophils Absolute: 0 10*3/uL (ref 0.0–0.1)
Basophils Relative: 0 %
Eosinophils Absolute: 0 10*3/uL (ref 0.0–0.5)
Eosinophils Relative: 0 %
HCT: 36.3 % — ABNORMAL LOW (ref 39.0–52.0)
Hemoglobin: 13 g/dL (ref 13.0–17.0)
Immature Granulocytes: 0 %
Lymphocytes Relative: 8 %
Lymphs Abs: 0.9 10*3/uL (ref 0.7–4.0)
MCH: 31.9 pg (ref 26.0–34.0)
MCHC: 35.8 g/dL (ref 30.0–36.0)
MCV: 89 fL (ref 80.0–100.0)
Monocytes Absolute: 0.8 10*3/uL (ref 0.1–1.0)
Monocytes Relative: 7 %
Neutro Abs: 9.6 10*3/uL — ABNORMAL HIGH (ref 1.7–7.7)
Neutrophils Relative %: 85 %
Platelets: 135 10*3/uL — ABNORMAL LOW (ref 150–400)
RBC: 4.08 MIL/uL — ABNORMAL LOW (ref 4.22–5.81)
RDW: 12.7 % (ref 11.5–15.5)
WBC: 11.3 10*3/uL — ABNORMAL HIGH (ref 4.0–10.5)
nRBC: 0 % (ref 0.0–0.2)

## 2020-03-25 LAB — COMPREHENSIVE METABOLIC PANEL
ALT: 22 U/L (ref 0–44)
AST: 23 U/L (ref 15–41)
Albumin: 3.2 g/dL — ABNORMAL LOW (ref 3.5–5.0)
Alkaline Phosphatase: 50 U/L (ref 38–126)
Anion gap: 10 (ref 5–15)
BUN: 11 mg/dL (ref 6–20)
CO2: 21 mmol/L — ABNORMAL LOW (ref 22–32)
Calcium: 8.5 mg/dL — ABNORMAL LOW (ref 8.9–10.3)
Chloride: 106 mmol/L (ref 98–111)
Creatinine, Ser: 0.9 mg/dL (ref 0.61–1.24)
GFR calc Af Amer: >60 mL/min (ref >60)
GFR calc non Af Amer: >60 mL/min (ref >60)
Glucose, Bld: 179 mg/dL — ABNORMAL HIGH (ref 70–99)
Potassium: 3.4 mmol/L — ABNORMAL LOW (ref 3.5–5.1)
Sodium: 137 mmol/L (ref 135–145)
Total Bilirubin: 1 mg/dL (ref 0.3–1.2)
Total Protein: 5.9 g/dL — ABNORMAL LOW (ref 6.5–8.1)

## 2020-03-25 LAB — CBC
HCT: 36.2 % — ABNORMAL LOW (ref 39.0–52.0)
Hemoglobin: 12.9 g/dL — ABNORMAL LOW (ref 13.0–17.0)
MCH: 31.7 pg (ref 26.0–34.0)
MCHC: 35.6 g/dL (ref 30.0–36.0)
MCV: 88.9 fL (ref 80.0–100.0)
Platelets: 118 10*3/uL — ABNORMAL LOW (ref 150–400)
RBC: 4.07 MIL/uL — ABNORMAL LOW (ref 4.22–5.81)
RDW: 13 % (ref 11.5–15.5)
WBC: 9.3 10*3/uL (ref 4.0–10.5)
nRBC: 0 % (ref 0.0–0.2)

## 2020-03-25 LAB — BASIC METABOLIC PANEL
Anion gap: 9 (ref 5–15)
BUN: 12 mg/dL (ref 6–20)
CO2: 22 mmol/L (ref 22–32)
Calcium: 8.6 mg/dL — ABNORMAL LOW (ref 8.9–10.3)
Chloride: 107 mmol/L (ref 98–111)
Creatinine, Ser: 0.91 mg/dL (ref 0.61–1.24)
GFR calc Af Amer: >60 mL/min (ref >60)
GFR calc non Af Amer: >60 mL/min (ref >60)
Glucose, Bld: 186 mg/dL — ABNORMAL HIGH (ref 70–99)
Potassium: 3.3 mmol/L — ABNORMAL LOW (ref 3.5–5.1)
Sodium: 138 mmol/L (ref 135–145)

## 2020-03-25 LAB — URINALYSIS, ROUTINE W REFLEX MICROSCOPIC
Bacteria, UA: NONE SEEN
Bilirubin Urine: NEGATIVE
Glucose, UA: >=500 mg/dL — AB
Hgb urine dipstick: NEGATIVE
Ketones, ur: 20 mg/dL — AB
Leukocytes,Ua: NEGATIVE
Nitrite: NEGATIVE
Protein, ur: NEGATIVE mg/dL
Specific Gravity, Urine: 1.03 (ref 1.005–1.030)
pH: 5 (ref 5.0–8.0)

## 2020-03-25 LAB — LIPID PANEL
Cholesterol: 180 mg/dL (ref 0–200)
HDL: 21 mg/dL — ABNORMAL LOW (ref >40)
LDL Cholesterol: UNDETERMINED mg/dL (ref 0–99)
Total CHOL/HDL Ratio: 8.6 RATIO
Triglycerides: 798 mg/dL — ABNORMAL HIGH (ref <150)
VLDL: UNDETERMINED mg/dL (ref 0–40)

## 2020-03-25 LAB — RAPID URINE DRUG SCREEN, HOSP PERFORMED
Amphetamines: NOT DETECTED
Barbiturates: NOT DETECTED
Benzodiazepines: POSITIVE — AB
Cocaine: NOT DETECTED
Opiates: NOT DETECTED
Tetrahydrocannabinol: NOT DETECTED

## 2020-03-25 LAB — LDL CHOLESTEROL, DIRECT: Direct LDL: 55.1 mg/dL (ref 0–99)

## 2020-03-25 LAB — PLATELET INHIBITION P2Y12: Platelet Function  P2Y12: 71 [PRU] — ABNORMAL LOW (ref 182–335)

## 2020-03-25 LAB — TRIGLYCERIDES: Triglycerides: 675 mg/dL — ABNORMAL HIGH (ref <150)

## 2020-03-25 LAB — HIV ANTIBODY (ROUTINE TESTING W REFLEX): HIV Screen 4th Generation wRfx: NONREACTIVE

## 2020-03-25 LAB — MAGNESIUM: Magnesium: 2.1 mg/dL (ref 1.7–2.4)

## 2020-03-25 LAB — PHOSPHORUS: Phosphorus: 2.4 mg/dL — ABNORMAL LOW (ref 2.5–4.6)

## 2020-03-25 MED ORDER — DEXMEDETOMIDINE HCL IN NACL 400 MCG/100ML IV SOLN
0.0000 ug/kg/h | INTRAVENOUS | Status: DC
  Administered 2020-03-25: 12:00:00 0.8 ug/kg/h via INTRAVENOUS
  Filled 2020-03-25: qty 100

## 2020-03-25 MED ORDER — FENTANYL BOLUS VIA INFUSION
50.0000 ug | INTRAVENOUS | Status: DC | PRN
  Administered 2020-03-25: 50 ug via INTRAVENOUS
  Filled 2020-03-25: qty 50

## 2020-03-25 MED ORDER — SODIUM CHLORIDE 0.9 % IV SOLN: INTRAVENOUS | Status: DC

## 2020-03-25 MED ORDER — POTASSIUM CHLORIDE 20 MEQ/15ML (10%) PO SOLN
20.0000 meq | ORAL | Status: AC
  Administered 2020-03-25 (×2): 20 meq
  Filled 2020-03-25 (×2): qty 15

## 2020-03-25 MED ORDER — THIAMINE HCL 100 MG/ML IJ SOLN
100.0000 mg | Freq: Every day | INTRAMUSCULAR | Status: DC
  Administered 2020-03-25 – 2020-03-27 (×3): 100 mg via INTRAVENOUS
  Filled 2020-03-25 (×3): qty 2

## 2020-03-25 MED ORDER — ATORVASTATIN CALCIUM 80 MG PO TABS
80.0000 mg | ORAL_TABLET | Freq: Every day | ORAL | Status: DC
  Administered 2020-03-26 – 2020-04-15 (×21): 80 mg
  Filled 2020-03-25 (×21): qty 1

## 2020-03-25 MED ORDER — ATORVASTATIN CALCIUM 80 MG PO TABS
80.0000 mg | ORAL_TABLET | Freq: Every day | ORAL | Status: DC
  Administered 2020-03-25: 80 mg via ORAL
  Filled 2020-03-25: qty 1

## 2020-03-25 MED ORDER — NICARDIPINE HCL IN NACL 40-0.83 MG/200ML-% IV SOLN
3.0000 mg/h | INTRAVENOUS | Status: DC
  Administered 2020-03-25: 8 mg/h via INTRAVENOUS
  Filled 2020-03-25 (×2): qty 200

## 2020-03-25 MED ORDER — ADULT MULTIVITAMIN W/MINERALS CH: 1.0000 | ORAL_TABLET | Freq: Every day | ORAL | Status: DC

## 2020-03-25 MED ORDER — ADULT MULTIVITAMIN W/MINERALS CH
1.0000 | ORAL_TABLET | Freq: Every day | ORAL | Status: DC
  Administered 2020-03-25 – 2020-04-02 (×9): 1
  Filled 2020-03-25 (×9): qty 1

## 2020-03-25 MED ORDER — FOLIC ACID 1 MG PO TABS
1.0000 mg | ORAL_TABLET | Freq: Every day | ORAL | Status: DC
  Administered 2020-03-25: 10:00:00 1 mg via ORAL
  Filled 2020-03-25: qty 1

## 2020-03-25 MED ORDER — THIAMINE HCL 100 MG PO TABS
100.0000 mg | ORAL_TABLET | Freq: Every day | ORAL | Status: DC
  Filled 2020-03-25 (×2): qty 1

## 2020-03-25 MED ORDER — LORAZEPAM 1 MG PO TABS: 1.0000 mg | ORAL_TABLET | ORAL | Status: DC | PRN

## 2020-03-25 MED ORDER — LORAZEPAM 2 MG/ML IJ SOLN: 1.0000 mg | INTRAMUSCULAR | Status: DC | PRN

## 2020-03-25 MED ORDER — FENTANYL 2500MCG IN NS 250ML (10MCG/ML) PREMIX INFUSION
50.0000 ug/h | INTRAVENOUS | Status: DC
  Administered 2020-03-25: 100 ug/h via INTRAVENOUS
  Filled 2020-03-25: qty 250

## 2020-03-25 MED ORDER — POTASSIUM & SODIUM PHOSPHATES 280-160-250 MG PO PACK
1.0000 | PACK | Freq: Three times a day (TID) | ORAL | Status: AC
  Administered 2020-03-25 (×2): 1
  Filled 2020-03-25 (×2): qty 1

## 2020-03-25 MED ORDER — FOLIC ACID 1 MG PO TABS
1.0000 mg | ORAL_TABLET | Freq: Every day | ORAL | Status: DC
  Administered 2020-03-26 – 2020-04-02 (×8): 1 mg
  Filled 2020-03-25 (×8): qty 1

## 2020-03-25 NOTE — Progress Notes (Signed)
PT Cancellation Note  Patient Details Name: Brad Delacruz MRN: [id number] DOB: 1961-08-08   Cancelled Treatment:    Reason Eval/Treat Not Completed: Patient not medically ready.  Pt still intubated, sedated and on bedrest.  Will see 4/22 as able. 03/25/2020  Jacinto Halim., PT Acute Rehabilitation Services [phone]  (pager) [phone]  (office)   Eliseo Gum Aundray Cartlidge 03/25/2020, 12:23 PM

## 2020-03-25 NOTE — Progress Notes (Signed)
Inpatient Diabetes Program Recommendations  AACE/ADA: New Consensus Statement on Inpatient Glycemic Control   Target Ranges:  Prepandial:   less than 140 mg/dL      Peak postprandial:   less than 180 mg/dL (1-2 hours)      Critically ill patients:  140 - 180 mg/dL   Results for DAISY, MCNEEL (MRN [id number]) as of 03/25/2020 09:47  Ref. Range 03/24/2020 06:05 03/24/2020 11:46 03/24/2020 15:39 03/24/2020 20:09 03/25/2020 00:15 03/25/2020 03:18 03/25/2020 07:48  Glucose-Capillary Latest Ref Range: 70 - 99 mg/dL 950 (H) 932 (H)  Novolog 5 units 291 (H)  Novolog 8 units 194 (H)  Novolog 3 units 220 (H)  Novolog 5 units 180 (H)  Novolog 3 units 169 (H)  Novolog 3 units  Results for ANTWION, CARPENTER (MRN [id number]) as of 03/25/2020 09:47  Ref. Range 03/24/2020 14:58  Hemoglobin A1C Latest Ref Range: 4.8 - 5.6 % 9.2 (H)   Review of Glycemic Control  Diabetes history: DM2 Outpatient Diabetes medications: Farxiga 10 mg daily, Xigduo XR 09-999 mg daily Current orders for Inpatient glycemic control: Novolog 0-15 units Q4H  Inpatient Diabetes Program Recommendations:   Insulin-Basal: Please consider ordering Levemir 8 units Q24H (based on 83.8 kg x 0.1 units).  HgbA1C: A1C 9.2% on 03/24/20 indicating an average glucose of 217 mg/dl over the past 2-3 months.   Thanks, Brad Penner, RN, MSN, CDE Diabetes Coordinator Inpatient Diabetes Program [phone] (Team Pager from 8am to 5pm)

## 2020-03-25 NOTE — Progress Notes (Signed)
Patient was transported to 4N20 without any complications. Report was given to 4N RT.

## 2020-03-25 NOTE — Progress Notes (Signed)
PT transported to MRI and back with no complications. 

## 2020-03-25 NOTE — Progress Notes (Addendum)
Referring Physician(s): Code Stroke- Brad Delacruz, Ashish  Supervising Physician: Julieanne Cotton  Patient Status:  [facility] - In-pt  Chief Complaint: None- intubated with sedation  Subjective:  History of acute CVA s/p cerebral arteriogram with emergent mechanical thrombectomy and rescue stenting of left MCA M1 segment occlusion achieving a TICI 2C revascularization 03/24/2020 by Dr. Corliss Skains. Patient laying in bed intubated with heavy sedation. He does not respond to voice. Can spontaneously move left side, right side withdraws from pain. Right groin incision (access) and left groin incision (arterial line) sites c/d/i.   Allergies: Patient has no known allergies.  Medications: Prior to Admission medications   Medication Sig Start Date End Date Taking? Authorizing Provider  ciclopirox (PENLAC) 8 % solution Apply topically as directed. 10/03/18   [provider]  dapagliflozin propanediol (FARXIGA) 10 MG TABS tablet Take 10 mg by mouth daily. 02/11/18   [provider]  glipiZIDE (GLUCOTROL XL) 5 MG 24 hr tablet Take 5 mg by mouth 2 (two) times daily. 01/10/20   [provider]  losartan (COZAAR) 25 MG tablet Take 25 mg by mouth daily. 01/23/19   [provider]  meloxicam (MOBIC) 15 MG tablet Take 15 mg by mouth daily. 11/23/19   [provider]  pioglitazone (ACTOS) 30 MG tablet Take 30 mg by mouth daily. 12/28/19   [provider]  pravastatin (PRAVACHOL) 10 MG tablet Take 10 mg by mouth daily. 09/07/18   [provider]  rosuvastatin (CRESTOR) 5 MG tablet Take 5 mg by mouth daily. 12/31/19   [provider]  XIGDUO XR 09-999 MG TB24 Take 1 tablet by mouth daily. 01/24/19   [provider]     Vital Signs: BP 127/67   Pulse 84   Temp 99.3 F (37.4 C) (Oral)   Resp 18   Ht  (1.651 m)   Wt 184 lb 11.9 oz (83.8 kg)   SpO2 100%   BMI 30.74 kg/m   Physical Exam Vitals and nursing note reviewed.    Constitutional:      General: He is not in acute distress.    Comments: Intubated with sedation.  Pulmonary:     Effort: Pulmonary effort is normal. No respiratory distress.     Comments: Intubated with sedation. Skin:    General: Skin is warm and dry.     Comments: Right groin incision (access) site soft without active bleeding or hematoma; Left groin incision (arterial line) site soft with sheath in place, no active bleeding or hematoma.   Neurological:     Comments: Intubated with sedation. He does not respond to voice. Can spontaneously move left side, right side withdraws from pain.     Imaging: CT Angio Head W or Wo Contrast  Result Date: 03/24/2020 CLINICAL DATA:  Patient found down at 5 a.m. Right-sided weakness. Abnormal speech. Facial droop. EXAM: CT ANGIOGRAPHY HEAD AND NECK CT PERFUSION BRAIN TECHNIQUE: Multidetector CT imaging of the head and neck was performed using the standard protocol during bolus administration of intravenous contrast. Multiplanar CT image reconstructions and MIPs were obtained to evaluate the vascular anatomy. Carotid stenosis measurements (when applicable) are obtained utilizing NASCET criteria, using the distal internal carotid diameter as the denominator. Multiphase CT imaging of the brain was performed following IV bolus contrast injection. Subsequent parametric perfusion maps were calculated using RAPID software. CONTRAST:  OMNIPAQUE IOHEXOL 350 MG/ML SOLN COMPARISON:  CT head without contrast 03/24/2020 FINDINGS: CTA NECK FINDINGS Aortic arch: A 3 vessel arch  configuration is present. Atherosclerotic calcifications are present at the aortic arch. No aneurysm or significant stenosis is present. Right carotid system: Right common carotid artery is within normal limits. Atherosclerotic changes are present at the bifurcation without significant stenosis. Study is mildly degraded by dental artifact. Left carotid system: The left common carotid artery is  within normal limits. Atherosclerotic changes are present at the proximal left ICA without a significant stenosis relative to the more distal vessel. Vertebral arteries: The right vertebral artery is the dominant vessel. Both vertebral arteries originate from the subclavian arteries without significant stenosis. Is no significant stenosis of either vertebral artery in the neck. Skeleton: Degenerative changes are present cervical spine with slight reversal of normal cervical lordosis. No focal lytic or blastic lesions are present. Other neck: The soft tissues the neck are unremarkable. Upper chest: Mild dependent atelectasis is present. Thoracic inlet is normal. Review of the MIP images confirms the above findings CTA HEAD FINDINGS Anterior circulation: Internal carotid arteries are within normal limits through the ICA termini bilaterally. The A1 segments are normal bilaterally. The anterior communicating artery is patent. Right M1 segment is normal. Left M1 segment is occluded proximal to the bifurcation. Collateral vessels are evident within the sylvian fissure. Bilateral ACA and right MCA branch vessels are within normal limits. Posterior circulation: Internal carotid right vertebral artery is the dominant vessel. The non dominant left vertebral artery terminates at the PICA. Right vertebral artery becomes the basilar artery. Both posterior cerebral arteries originate from basilar tip. Prominent left posterior communicating artery is present. PCA branch vessels are within normal limits bilaterally. Venous sinuses: The dural sinuses are patent. The straight sinus deep cerebral veins are intact. Cortical veins are within normal limits. Anatomic variants: Prominent left posterior communicating artery. Review of the MIP images confirms the above findings CT Brain Perfusion Findings: ASPECTS: 10/10 CBF (<30%) Volume: 83mL Perfusion (Tmax>6.0s) volume: 82mL Mismatch Volume: 99mL Infarction Location:Left MCA IMPRESSION:  1. Large left MCA territory nonhemorrhagic infarct. 2. Acute left M1 large vessel occlusion. 3. Large penumbra with mismatch volume of 99 mL. 4. Collateral vessels are present within the sylvian fissure. 5. Atherosclerotic changes at the carotid bifurcations bilaterally without significant stenosis. 6. Aortic Atherosclerosis (ICD10-I70.0). No significant stenosis at the arch. These results were called by telephone at the time of interpretation on 03/24/2020 at 6:13 am to provider [facility] , who verbally acknowledged these results. Electronically Signed   By: Marin Roberts M.D.   On: 03/24/2020 06:26   CT HEAD WO CONTRAST  Result Date: 03/24/2020 CLINICAL DATA:  Follow-up acute left cerebral hemisphere infarct with intervention. EXAM: CT HEAD WITHOUT CONTRAST TECHNIQUE: Contiguous axial images were obtained from the base of the skull through the vertex without intravenous contrast. COMPARISON:  Earlier today. FINDINGS: Brain: Again demonstrated is ill-defined low density and subarachnoid hemorrhage in the left middle cerebral and left posterior cerebral artery distributions. A small, old curvilinear infarct along the lateral margin of the left caudate head is again noted. Normal size and position of the ventricles. No shift of midline structures. Vascular: Left middle cerebral artery stent. Skull: Normal. Negative for fracture or focal lesion. Sinuses/Orbits: Unremarkable. Other: None. IMPRESSION: 1. Acute left middle cerebral and left posterior cerebral artery territory infarct without mass effect. 2. Stable subarachnoid hemorrhage on the left. 3. Stable left middle cerebral artery stent. 4. Stable small, old curvilinear infarct along the lateral margin of the left caudate head. Electronically Signed   By: Beckie Salts M.D.   On:  03/24/2020 18:45   CT Angio Neck W and/or Wo Contrast  Result Date: 03/24/2020 CLINICAL DATA:  Patient found down at 5 a.m. Right-sided weakness. Abnormal speech.  Facial droop. EXAM: CT ANGIOGRAPHY HEAD AND NECK CT PERFUSION BRAIN TECHNIQUE: Multidetector CT imaging of the head and neck was performed using the standard protocol during bolus administration of intravenous contrast. Multiplanar CT image reconstructions and MIPs were obtained to evaluate the vascular anatomy. Carotid stenosis measurements (when applicable) are obtained utilizing NASCET criteria, using the distal internal carotid diameter as the denominator. Multiphase CT imaging of the brain was performed following IV bolus contrast injection. Subsequent parametric perfusion maps were calculated using RAPID software. CONTRAST:  OMNIPAQUE IOHEXOL 350 MG/ML SOLN COMPARISON:  CT head without contrast 03/24/2020 FINDINGS: CTA NECK FINDINGS Aortic arch: A 3 vessel arch configuration is present. Atherosclerotic calcifications are present at the aortic arch. No aneurysm or significant stenosis is present. Right carotid system: Right common carotid artery is within normal limits. Atherosclerotic changes are present at the bifurcation without significant stenosis. Study is mildly degraded by dental artifact. Left carotid system: The left common carotid artery is within normal limits. Atherosclerotic changes are present at the proximal left ICA without a significant stenosis relative to the more distal vessel. Vertebral arteries: The right vertebral artery is the dominant vessel. Both vertebral arteries originate from the subclavian arteries without significant stenosis. Is no significant stenosis of either vertebral artery in the neck. Skeleton: Degenerative changes are present cervical spine with slight reversal of normal cervical lordosis. No focal lytic or blastic lesions are present. Other neck: The soft tissues the neck are unremarkable. Upper chest: Mild dependent atelectasis is present. Thoracic inlet is normal. Review of the MIP images confirms the above findings CTA HEAD FINDINGS Anterior circulation:  Internal carotid arteries are within normal limits through the ICA termini bilaterally. The A1 segments are normal bilaterally. The anterior communicating artery is patent. Right M1 segment is normal. Left M1 segment is occluded proximal to the bifurcation. Collateral vessels are evident within the sylvian fissure. Bilateral ACA and right MCA branch vessels are within normal limits. Posterior circulation: Internal carotid right vertebral artery is the dominant vessel. The non dominant left vertebral artery terminates at the PICA. Right vertebral artery becomes the basilar artery. Both posterior cerebral arteries originate from basilar tip. Prominent left posterior communicating artery is present. PCA branch vessels are within normal limits bilaterally. Venous sinuses: The dural sinuses are patent. The straight sinus deep cerebral veins are intact. Cortical veins are within normal limits. Anatomic variants: Prominent left posterior communicating artery. Review of the MIP images confirms the above findings CT Brain Perfusion Findings: ASPECTS: 10/10 CBF (<30%) Volume: 22mL Perfusion (Tmax>6.0s) volume: 47mL Mismatch Volume: 79mL Infarction Location:Left MCA IMPRESSION: 1. Large left MCA territory nonhemorrhagic infarct. 2. Acute left M1 large vessel occlusion. 3. Large penumbra with mismatch volume of 99 mL. 4. Collateral vessels are present within the sylvian fissure. 5. Atherosclerotic changes at the carotid bifurcations bilaterally without significant stenosis. 6. Aortic Atherosclerosis (ICD10-I70.0). No significant stenosis at the arch. These results were called by telephone at the time of interpretation on 03/24/2020 at 6:13 am to provider [facility] , who verbally acknowledged these results. Electronically Signed   By: Marin Roberts M.D.   On: 03/24/2020 06:26   CT CEREBRAL PERFUSION W CONTRAST  Result Date: 03/24/2020 CLINICAL DATA:  Patient found down at 5 a.m. Right-sided weakness. Abnormal  speech. Facial droop. EXAM: CT ANGIOGRAPHY HEAD AND NECK CT PERFUSION  BRAIN TECHNIQUE: Multidetector CT imaging of the head and neck was performed using the standard protocol during bolus administration of intravenous contrast. Multiplanar CT image reconstructions and MIPs were obtained to evaluate the vascular anatomy. Carotid stenosis measurements (when applicable) are obtained utilizing NASCET criteria, using the distal internal carotid diameter as the denominator. Multiphase CT imaging of the brain was performed following IV bolus contrast injection. Subsequent parametric perfusion maps were calculated using RAPID software. CONTRAST:  OMNIPAQUE IOHEXOL 350 MG/ML SOLN COMPARISON:  CT head without contrast 03/24/2020 FINDINGS: CTA NECK FINDINGS Aortic arch: A 3 vessel arch configuration is present. Atherosclerotic calcifications are present at the aortic arch. No aneurysm or significant stenosis is present. Right carotid system: Right common carotid artery is within normal limits. Atherosclerotic changes are present at the bifurcation without significant stenosis. Study is mildly degraded by dental artifact. Left carotid system: The left common carotid artery is within normal limits. Atherosclerotic changes are present at the proximal left ICA without a significant stenosis relative to the more distal vessel. Vertebral arteries: The right vertebral artery is the dominant vessel. Both vertebral arteries originate from the subclavian arteries without significant stenosis. Is no significant stenosis of either vertebral artery in the neck. Skeleton: Degenerative changes are present cervical spine with slight reversal of normal cervical lordosis. No focal lytic or blastic lesions are present. Other neck: The soft tissues the neck are unremarkable. Upper chest: Mild dependent atelectasis is present. Thoracic inlet is normal. Review of the MIP images confirms the above findings CTA HEAD FINDINGS Anterior  circulation: Internal carotid arteries are within normal limits through the ICA termini bilaterally. The A1 segments are normal bilaterally. The anterior communicating artery is patent. Right M1 segment is normal. Left M1 segment is occluded proximal to the bifurcation. Collateral vessels are evident within the sylvian fissure. Bilateral ACA and right MCA branch vessels are within normal limits. Posterior circulation: Internal carotid right vertebral artery is the dominant vessel. The non dominant left vertebral artery terminates at the PICA. Right vertebral artery becomes the basilar artery. Both posterior cerebral arteries originate from basilar tip. Prominent left posterior communicating artery is present. PCA branch vessels are within normal limits bilaterally. Venous sinuses: The dural sinuses are patent. The straight sinus deep cerebral veins are intact. Cortical veins are within normal limits. Anatomic variants: Prominent left posterior communicating artery. Review of the MIP images confirms the above findings CT Brain Perfusion Findings: ASPECTS: 10/10 CBF (<30%) Volume: 59mL Perfusion (Tmax>6.0s) volume: 32mL Mismatch Volume: 30mL Infarction Location:Left MCA IMPRESSION: 1. Large left MCA territory nonhemorrhagic infarct. 2. Acute left M1 large vessel occlusion. 3. Large penumbra with mismatch volume of 99 mL. 4. Collateral vessels are present within the sylvian fissure. 5. Atherosclerotic changes at the carotid bifurcations bilaterally without significant stenosis. 6. Aortic Atherosclerosis (ICD10-I70.0). No significant stenosis at the arch. These results were called by telephone at the time of interpretation on 03/24/2020 at 6:13 am to provider [facility] , who verbally acknowledged these results. Electronically Signed   By: Marin Roberts M.D.   On: 03/24/2020 06:26   Portable Chest xray  Result Date: 03/25/2020 CLINICAL DATA:  Respiratory failure. Left MCA infarct. EXAM: PORTABLE CHEST  1 VIEW COMPARISON:  One-view chest x-ray 06/13/2008 FINDINGS: Heart size is normal. Lung volumes are low. The patient is intubated. Endotracheal tube terminates 4 cm above the carina. NG tube courses off the inferior border the film. IMPRESSION: 1. Endotracheal tube terminates 4 cm above the carina. 2. Low lung volumes. Electronically  Signed   By: San Morelle M.D.   On: 03/25/2020 06:05   ECHOCARDIOGRAM COMPLETE  Result Date: 03/24/2020    ECHOCARDIOGRAM REPORT   Patient Name:   Brad Delacruz Date of Exam: 03/24/2020 Medical Rec #:  [id number]        Height:       65.0 in Accession #:    [id number]       Weight:       189.4 lb Date of Birth:  09/09/1961       BSA:          1.933 m Patient Age:    59 years         BP:           119/53 mmHg Patient Gender: M                HR:           56 bpm. Exam Location:  Inpatient Procedure: 2D Echo, Cardiac Doppler and Color Doppler Indications:    Stroke 434.91 / I163.9  History:        Patient has no prior history of Echocardiogram examinations.                 Risk Factors:Diabetes.  Sonographer:    Jonelle Sidle Dance Referring Phys: [id number] ASHISH ARORA IMPRESSIONS  1. Normal LV systolic function; grade 1 diastolic dysfunction.  2. Left ventricular ejection fraction, by estimation, is 55 to 60%. The left ventricle has normal function. The left ventricle has no regional wall motion abnormalities. Left ventricular diastolic parameters are consistent with Grade I diastolic dysfunction (impaired relaxation).  3. Right ventricular systolic function is normal. The right ventricular size is normal.  4. The mitral valve is normal in structure. No evidence of mitral valve regurgitation. No evidence of mitral stenosis.  5. The aortic valve is tricuspid. Aortic valve regurgitation is not visualized. Mild aortic valve sclerosis is present, with no evidence of aortic valve stenosis. FINDINGS  Left Ventricle: Left ventricular ejection fraction, by estimation, is 55 to 60%. The  left ventricle has normal function. The left ventricle has no regional wall motion abnormalities. The left ventricular internal cavity size was normal in size. There is  no left ventricular hypertrophy. Left ventricular diastolic parameters are consistent with Grade I diastolic dysfunction (impaired relaxation). Right Ventricle: The right ventricular size is normal. Right ventricular systolic function is normal. Left Atrium: Left atrial size was normal in size. Right Atrium: Right atrial size was normal in size. Pericardium: There is no evidence of pericardial effusion. Mitral Valve: The mitral valve is normal in structure. Normal mobility of the mitral valve leaflets. No evidence of mitral valve regurgitation. No evidence of mitral valve stenosis. Tricuspid Valve: The tricuspid valve is normal in structure. Tricuspid valve regurgitation is not demonstrated. No evidence of tricuspid stenosis. Aortic Valve: The aortic valve is tricuspid. Aortic valve regurgitation is not visualized. Mild aortic valve sclerosis is present, with no evidence of aortic valve stenosis. Pulmonic Valve: The pulmonic valve was normal in structure. Pulmonic valve regurgitation is not visualized. No evidence of pulmonic stenosis. Aorta: The aortic root is normal in size and structure. Venous: The inferior vena cava was not well visualized.  Additional Comments: Normal LV systolic function; grade 1 diastolic dysfunction.  LEFT VENTRICLE PLAX 2D LVIDd:         3.09 cm  Diastology LVIDs:         2.86 cm  LV e' lateral:   4.57  cm/s LV PW:         0.99 cm  LV E/e' lateral: 9.2 LV IVS:        0.94 cm  LV e' medial:    4.90 cm/s LVOT diam:     2.10 cm  LV E/e' medial:  8.6 LV SV:         62 LV SV Index:   32 LVOT Area:     3.46 cm  RIGHT VENTRICLE             IVC RV Basal diam:  2.12 cm     IVC diam: 2.18 cm RV S prime:     10.20 cm/s TAPSE (M-mode): 2.1 cm LEFT ATRIUM             Index       RIGHT ATRIUM           Index LA diam:        3.10 cm 1.60  cm/m  RA Area:     10.60 cm LA Vol (A2C):   35.2 ml 18.21 ml/m RA Volume:   17.40 ml  9.00 ml/m LA Vol (A4C):   35.0 ml 18.11 ml/m LA Biplane Vol: 35.2 ml 18.21 ml/m  AORTIC VALVE LVOT Vmax:   79.10 cm/s LVOT Vmean:  47.100 cm/s LVOT VTI:    0.180 m  AORTA Ao Root diam: 3.50 cm Ao Asc diam:  3.45 cm MITRAL VALVE MV Area (PHT): 1.78 cm    SHUNTS MV Decel Time: 426 msec    Systemic VTI:  0.18 m MV E velocity: 42.20 cm/s  Systemic Diam: 2.10 cm MV A velocity: 45.60 cm/s MV E/A ratio:  0.93 Olga Millers MD Electronically signed by Olga Millers MD Signature Date/Time: 03/24/2020/2:36:05 PM    Final    CT HEAD CODE STROKE WO CONTRAST  Result Date: 03/24/2020 CLINICAL DATA:  Code stroke. Ataxia. Right-sided weakness. Abnormal speech. Facial droop. Symptoms began at 5 a.m. EXAM: CT HEAD WITHOUT CONTRAST TECHNIQUE: Contiguous axial images were obtained from the base of the skull through the vertex without intravenous contrast. COMPARISON:  None. FINDINGS: Brain: No acute infarct, hemorrhage, or mass lesion is present. Remote infarct is present in the left corona radiata and anterior limb internal capsule. The basal ganglia are intact. Insular ribbon is normal. No acute hemorrhage or mass lesion is present. The ventricles are of normal size. No significant extraaxial fluid collection is present. The brainstem and cerebellum are within normal limits. Vascular: No hyperdense vessel or unexpected calcification. Skull: Calvarium is intact. No focal lytic or blastic lesions are present. No significant extracranial soft tissue lesion is present. Sinuses/Orbits: The paranasal sinuses and mastoid air cells are clear. The globes and orbits are within normal limits. ASPECTS [facility] Stroke Program Early CT Score) - Ganglionic level infarction (caudate, lentiform nuclei, internal capsule, insula, M1-M3 cortex): 7/7 - Supraganglionic infarction (M4-M6 cortex): 3/3 Total score (0-10 with 10 being normal): 10/10 IMPRESSION:  1. No acute intracranial abnormality. 2. 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 3. ASPECTS is 10/10 These results were called by telephone at the time of interpretation on 03/24/2020 at 5:46 am to provider [facility] , who verbally acknowledged these results. Electronically Signed   By: Marin Roberts M.D.   On: 03/24/2020 05:46    Labs:  CBC: Recent Labs    03/24/20 0601 03/24/20 0613 03/24/20 1209 03/25/20 0307  WBC 5.4  --   --  11.3*  HGB 14.6 13.9 11.9*  13.0  HCT 40.4 41.0 35.0* 36.3*  PLT 120*  --   --  135*    COAGS: Recent Labs    03/24/20 0601  INR 1.0  APTT 24    BMP: Recent Labs    03/24/20 0601 03/24/20 0613 03/24/20 1209 03/25/20 0307  NA 130* 133* 136 138  K 3.3* 3.3* 4.1 3.3*  CL 94* 96*  --  107  CO2 26  --   --  22  GLUCOSE 309* 293*  --  186*  BUN 20 20  --  12  CALCIUM 8.9  --   --  8.6*  CREATININE 0.94 0.90  --  0.91  GFRNONAA >60  --   --  >60  GFRAA >60  --   --  >60    LIVER FUNCTION TESTS: Recent Labs    03/24/20 0601  BILITOT 0.7  AST 21  ALT 27  ALKPHOS 90  PROT 6.5  ALBUMIN 3.8    Assessment and Plan:  History of acute CVA s/p cerebral arteriogram with emergent mechanical thrombectomy and rescue stenting of left MCA M1 segment occlusion achieving a TICI 2C revascularization 03/24/2020 by Dr. Corliss Skainseveshwar. Patient's condition stable- remains intubated/sedated, can spontaneously move left side, right side withdraws from pain. Right groin incision (access) stable, left groin incision (arterial line) stable with sheath in place- plan for left sheath removal today, post-sheath orders signed and held. Continue taking Brilinta 90 mg twice daily and Aspirin 81 mg once daily- will obtain P2Y12 today to check effectiveness of DAPT. Further plans per neurology/CCM- appreciate and agree with management. NIR to follow.   Electronically Signed: Elwin MochaAlexandra Ruthanne Mcneish, PA-C 03/25/2020, 8:46 AM   I spent a  total of 25 Minutes at the the patient's bedside AND on the patient's hospital floor or unit, greater than 50% of which was counseling/coordinating care for CVA s/p revascularization.

## 2020-03-25 NOTE — Procedures (Signed)
Left 4 french sheath removal:  No hematoma and pulses present prior to sheath removal.  Sheath removed at 1003 and held manual pressure for 15 minutes. No complications, pulses present and groin site level 0.  Verified groin site with [facility]  Lynann Beaver RT VI, R

## 2020-03-25 NOTE — Progress Notes (Signed)
OT Cancellation Note  Patient Details Name: Brad Delacruz MRN: [id number] DOB: 05-Sep-1961   Cancelled Treatment:    Reason Eval/Treat Not Completed: Patient not medically ready(Pt intubated, sedated and on bedrest.)  Evern Bio 03/25/2020, 8:08 AM  Martie Round, OTR/L Acute Rehabilitation Services Pager: [phone] Office: [phone]

## 2020-03-25 NOTE — Progress Notes (Signed)
eLink Physician-Brief Progress Note Patient Name: Brad Delacruz DOB: 11/14/1961 MRN: [id number]   Date of Service  03/25/2020  HPI/Events of Note  RN requesting left wrist soft restraints due to patient swinging at staff with his left hand.  eICU Interventions  Left soft wrist restraints ordered.        Migdalia Dk 03/25/2020, 9:47 PM

## 2020-03-25 NOTE — Progress Notes (Signed)
[facility] ADULT ICU REPLACEMENT PROTOCOL FOR AM LAB REPLACEMENT ONLY  The patient does apply for the [facility] Adult ICU Electrolyte Replacment Protocol based on the criteria listed below:   1. Is GFR >/= 40 ml/min? Yes.    Patient's GFR today is >60 2. Is urine output >/= 0.5 ml/kg/hr for the last 6 hours? Yes.   Patient's UOP is 1.2 ml/kg/hr 3. Is BUN < 60 mg/dL? Yes.    Patient's BUN today is 12 4. Abnormal electrolyte(s): K-3.3 5. Ordered repletion with: per protocol 6. If a panic level lab has been reported, has the CCM MD in charge been notified? Yes.  .   Physician:  Dr. Janne Lab, Dixon Boos 03/25/2020 5:47 AM

## 2020-03-25 NOTE — Progress Notes (Addendum)
STROKE TEAM PROGRESS NOTE   INTERVAL HISTORY RN at bedside. Pt transferred from 35M, just arrived in 4N. Left femoral sheath just removed so left leg was in restrain. Not able to get MRI and MRA due to concern of moving right leg in MRI machine. Will do CT for 24h of tPA. CT last evening showed large left MCA infarct with SAH. On ASA and brilinta.   Vitals:   03/25/20 0700 03/25/20 0725 03/25/20 0730 03/25/20 0800  BP: 126/65 (!) 166/66 132/66 127/67  Pulse: 82 80 77 84  Resp: 18 18 18 18   Temp:    99.3 F (37.4 C)  TempSrc:    Oral  SpO2: 100% 100% 100% 100%  Weight:      Height:        CBC:  Recent Labs  Lab 03/24/20 0601 03/24/20 0613 03/24/20 1209 03/25/20 0307  WBC 5.4  --   --  11.3*  NEUTROABS 3.0  --   --  9.6*  HGB 14.6   < > 11.9* 13.0  HCT 40.4   < > 35.0* 36.3*  MCV 88.4  --   --  89.0  PLT 120*  --   --  135*   < > = values in this interval not displayed.    Basic Metabolic Panel:  Recent Labs  Lab 03/24/20 0601 03/24/20 0601 03/24/20 0613 03/24/20 0613 03/24/20 1209 03/25/20 0307  NA 130*   < > 133*   < > 136 138  K 3.3*   < > 3.3*   < > 4.1 3.3*  CL 94*   < > 96*  --   --  107  CO2 26  --   --   --   --  22  GLUCOSE 309*   < > 293*  --   --  186*  BUN 20   < > 20  --   --  12  CREATININE 0.94   < > 0.90  --   --  0.91  CALCIUM 8.9  --   --   --   --  8.6*   < > = values in this interval not displayed.   Lipid Panel:     Component Value Date/Time   CHOL 180 03/25/2020 0307   TRIG 798 (H) 03/25/2020 0307   HDL 21 (L) 03/25/2020 0307   CHOLHDL 8.6 03/25/2020 0307   VLDL UNABLE TO CALCULATE IF TRIGLYCERIDE OVER 400 mg/dL 03/27/2020 44/12/270   LDLCALC UNABLE TO CALCULATE IF TRIGLYCERIDE OVER 400 mg/dL 5366 44/02/4741   5956:  Lab Results  Component Value Date   HGBA1C 9.2 (H) 03/24/2020   Urine Drug Screen:     Component Value Date/Time   LABOPIA NONE DETECTED 03/25/2020 0229   COCAINSCRNUR NONE DETECTED 03/25/2020 0229   LABBENZ  POSITIVE (A) 03/25/2020 0229   AMPHETMU NONE DETECTED 03/25/2020 0229   THCU NONE DETECTED 03/25/2020 0229   LABBARB NONE DETECTED 03/25/2020 0229    Alcohol Level     Component Value Date/Time   ETH <10 03/24/2020 0601    IMAGING past 24 hours CT HEAD WO CONTRAST  Result Date: 03/24/2020 CLINICAL DATA:  Follow-up acute left cerebral hemisphere infarct with intervention. EXAM: CT HEAD WITHOUT CONTRAST TECHNIQUE: Contiguous axial images were obtained from the base of the skull through the vertex without intravenous contrast. COMPARISON:  Earlier today. FINDINGS: Brain: Again demonstrated is ill-defined low density and subarachnoid hemorrhage in the left middle cerebral and left posterior cerebral artery distributions.  A small, old curvilinear infarct along the lateral margin of the left caudate head is again noted. Normal size and position of the ventricles. No shift of midline structures. Vascular: Left middle cerebral artery stent. Skull: Normal. Negative for fracture or focal lesion. Sinuses/Orbits: Unremarkable. Other: None. IMPRESSION: 1. Acute left middle cerebral and left posterior cerebral artery territory infarct without mass effect. 2. Stable subarachnoid hemorrhage on the left. 3. Stable left middle cerebral artery stent. 4. Stable small, old curvilinear infarct along the lateral margin of the left caudate head. Electronically Signed   By: Beckie Salts M.D.   On: 03/24/2020 18:45   Portable Chest xray  Result Date: 03/25/2020 CLINICAL DATA:  Respiratory failure. Left MCA infarct. EXAM: PORTABLE CHEST 1 VIEW COMPARISON:  One-view chest x-ray 06/13/2008 FINDINGS: Heart size is normal. Lung volumes are low. The patient is intubated. Endotracheal tube terminates 4 cm above the carina. NG tube courses off the inferior border the film. IMPRESSION: 1. Endotracheal tube terminates 4 cm above the carina. 2. Low lung volumes. Electronically Signed   By: Marin Roberts M.D.   On: 03/25/2020  06:05   ECHOCARDIOGRAM COMPLETE  Result Date: 03/24/2020    ECHOCARDIOGRAM REPORT   Patient Name:   KADDEN OSTERHOUT Date of Exam: 03/24/2020 Medical Rec #:  [id number]        Height:       65.0 in Accession #:    [id number]       Weight:       189.4 lb Date of Birth:  02-11-1961       BSA:          1.933 m Patient Age:    58 years         BP:           119/53 mmHg Patient Gender: M                HR:           56 bpm. Exam Location:  Inpatient Procedure: 2D Echo, Cardiac Doppler and Color Doppler Indications:    Stroke 434.91 / I163.9  History:        Patient has no prior history of Echocardiogram examinations.                 Risk Factors:Diabetes.  Sonographer:    Elmarie Shiley Dance Referring Phys: [id number] ASHISH ARORA IMPRESSIONS  1. Normal LV systolic function; grade 1 diastolic dysfunction.  2. Left ventricular ejection fraction, by estimation, is 55 to 60%. The left ventricle has normal function. The left ventricle has no regional wall motion abnormalities. Left ventricular diastolic parameters are consistent with Grade I diastolic dysfunction (impaired relaxation).  3. Right ventricular systolic function is normal. The right ventricular size is normal.  4. The mitral valve is normal in structure. No evidence of mitral valve regurgitation. No evidence of mitral stenosis.  5. The aortic valve is tricuspid. Aortic valve regurgitation is not visualized. Mild aortic valve sclerosis is present, with no evidence of aortic valve stenosis. FINDINGS  Left Ventricle: Left ventricular ejection fraction, by estimation, is 55 to 60%. The left ventricle has normal function. The left ventricle has no regional wall motion abnormalities. The left ventricular internal cavity size was normal in size. There is  no left ventricular hypertrophy. Left ventricular diastolic parameters are consistent with Grade I diastolic dysfunction (impaired relaxation). Right Ventricle: The right ventricular size is normal. Right ventricular  systolic function is normal. Left Atrium: Left atrial size was  normal in size. Right Atrium: Right atrial size was normal in size. Pericardium: There is no evidence of pericardial effusion. Mitral Valve: The mitral valve is normal in structure. Normal mobility of the mitral valve leaflets. No evidence of mitral valve regurgitation. No evidence of mitral valve stenosis. Tricuspid Valve: The tricuspid valve is normal in structure. Tricuspid valve regurgitation is not demonstrated. No evidence of tricuspid stenosis. Aortic Valve: The aortic valve is tricuspid. Aortic valve regurgitation is not visualized. Mild aortic valve sclerosis is present, with no evidence of aortic valve stenosis. Pulmonic Valve: The pulmonic valve was normal in structure. Pulmonic valve regurgitation is not visualized. No evidence of pulmonic stenosis. Aorta: The aortic root is normal in size and structure. Venous: The inferior vena cava was not well visualized.  Additional Comments: Normal LV systolic function; grade 1 diastolic dysfunction.  LEFT VENTRICLE PLAX 2D LVIDd:         3.09 cm  Diastology LVIDs:         2.86 cm  LV e' lateral:   4.57 cm/s LV PW:         0.99 cm  LV E/e' lateral: 9.2 LV IVS:        0.94 cm  LV e' medial:    4.90 cm/s LVOT diam:     2.10 cm  LV E/e' medial:  8.6 LV SV:         62 LV SV Index:   32 LVOT Area:     3.46 cm  RIGHT VENTRICLE             IVC RV Basal diam:  2.12 cm     IVC diam: 2.18 cm RV S prime:     10.20 cm/s TAPSE (M-mode): 2.1 cm LEFT ATRIUM             Index       RIGHT ATRIUM           Index LA diam:        3.10 cm 1.60 cm/m  RA Area:     10.60 cm LA Vol (A2C):   35.2 ml 18.21 ml/m RA Volume:   17.40 ml  9.00 ml/m LA Vol (A4C):   35.0 ml 18.11 ml/m LA Biplane Vol: 35.2 ml 18.21 ml/m  AORTIC VALVE LVOT Vmax:   79.10 cm/s LVOT Vmean:  47.100 cm/s LVOT VTI:    0.180 m  AORTA Ao Root diam: 3.50 cm Ao Asc diam:  3.45 cm MITRAL VALVE MV Area (PHT): 1.78 cm    SHUNTS MV Decel Time: 426 msec     Systemic VTI:  0.18 m MV E velocity: 42.20 cm/s  Systemic Diam: 2.10 cm MV A velocity: 45.60 cm/s MV E/A ratio:  0.93 Kirk Ruths MD Electronically signed by Kirk Ruths MD Signature Date/Time: 03/24/2020/2:36:05 PM    Final     PHYSICAL EXAM  Temp:  [94.1 F (34.5 C)-100.2 F (37.9 C)] 99.3 F (37.4 C) (04/21 0800) Pulse Rate:  [50-105] 90 (04/21 1105) Resp:  [14-30] 19 (04/21 1105) BP: (93-166)/(57-80) 128/66 (04/21 1105) SpO2:  [97 %-100 %] 100 % (04/21 1105) Arterial Line BP: (100-174)/(47-76) 112/50 (04/21 0930) FiO2 (%):  [40 %-50 %] 40 % (04/21 1105) Weight:  [83.8 kg] 83.8 kg (04/21 0323)  General - Well nourished, well developed, intubated on sedation.  Ophthalmologic - fundi not visualized due to noncooperation.  Cardiovascular - Regular rate and rhythm.  Neuro - intubated on sedation, eyes closed, not following commands. With forced eye  opening, eyes in mid position, not blinking to visual threat, doll's eyes sluggish, not tracking, PERRL. Corneal reflex weak bilaterally, gag and cough present. Breathing over the vent.  Facial symmetry not able to test due to ET tube.  Tongue protrusion not cooperative. On pain stimulation, mild withdraw of LUE, not able to test LLE due to recent femoral sheath removal. RUE and RLE slight withdraw on pain. DTR 1+ and no babinski. Sensation, coordination and gait not tested.   ASSESSMENT/PLAN Mr. Whitfield Dulay is a 59 y.o. male with history of HTN and DB noncompliant w/ medications presenting to [facility] with R sided weakness and inability to talk. NIH 28. CTA showed L M1 occlusion. Transferred to [facility]. [facility]. On arrival, received IV tPA 03/24/2020 at 0725 followed by mechanical thrombectomy w/ TICI2C revascularization and rescue stent placement.  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 Old L corona radiata and internal capsule infarcts. ASPECTS 10.     CTA head & neck large L MCA territory infarct. L M1 LVO. Collateral vessels in sylvian fissure. B ICA bifurcation atherosclerosis. Aortic atherosclerosis.   CT perfusion large L MCA penumbra.   Cerebral angio L MCA M1 occlusion w/ TICI2c revascularization x 6 passes. Rescue stenting for reocclusion   CT head 4/21 - evolving large left MCA infarct, trace midline shift, persistent high density and left C1 fissure, contrast versus blood  MRI  pending   MRA  pending   2D Echo EF 55-60%. No source of embolus   Consider LE doppler pending   May consider TEE and loop recorder if neuro improvement  LDL 55.1, TG 798->675   HgbA1c 9.2  SCDs for VTE prophylaxis  No antithrombotic prior to admission, now on aspirin 81 mg daily and Brilinta (ticagrelor) 90 mg bid given stent placement. Continue at d/c.  Therapy recommendations:  pending   Disposition:  pending   Cerebral edema  CT showed large left MCA infarct with minimal midline shift  MRI pending  Close monitoring  May need to consider hypertonic saline or hemicrani  Acute Respiratory Failure  Agitation  Intubated for IR, left intubated post IR for airway protection secondary to acute L MCA stroke  Sedated on propofol and versed -> fentanyl  CCM onboard  Hypertensive Emergency  SBP > 210 on arrival  Home meds:  Non-compliant - med rec lists:  losartan 25   BP variable during the night w/ agitation  Cleviprex changed to cardene d/t elevated TG  BP goal < 160  . Long-term BP goal normotensive  Hyperlipidemia  Home meds:  Non-compliant - med rec lists:  pravachol 10, crestor 5  LDL 55.1, goal < 70  TG 798->675  On lipitor 80  Continue statin at discharge  Diabetes type II Uncontrolled  Home meds:  Noncompliant - med rec lists:  dapagliflozin 10, glipizide 5, actos 30, dapagliflozin 10 / metformin  1000  HgbA1c 9.2, goal < 7.0  CBGs  SSI 0-15 q4  Dysphagia . Secondary to stroke . NPO . Speech on board . On IVF @ 75 . Consider tube feeding once more stable   Other Stroke Risk Factors  Hx ETOH use, alcohol level <10, advised to drink no more than 2 drink(s) a day  Substance abuse - UDS:  Benzos POSITIVE  Obesity, Body mass  index is 30.74 kg/m., recommend weight loss, diet and exercise as appropriate   Other Active Problems  Thrombocytopenia PLT 120-135  Hypokalemia K 3.3-4.1-3.3 - supplement   Hospital day # 1  This patient is critically ill due to large left MCA infarct, status post TPA and thrombectomy, uncontrolled diabetes, hypertension/hypotension and at significant risk of neurological worsening, death form recurrent stroke, hemorrhagic conversion, seizure, DKA, heart failure. This patient's care requires constant monitoring of vital signs, hemodynamics, respiratory and cardiac monitoring, review of multiple databases, neurological assessment, discussion with family, other specialists and medical decision making of high complexity. I spent 40 minutes of neurocritical care time in the care of this patient. I had long discussion with daughter over the phone, updated pt current condition, treatment plan and potential prognosis, and answered all the questions.  She expressed understanding and appreciation.    Marvel PlanJindong Lizbeth Feijoo, MD PhD Stroke Neurology 03/25/2020 8:13 PM  To contact Stroke Continuity provider, please refer to [email or web address]. After hours, contact General Neurology

## 2020-03-25 NOTE — Progress Notes (Signed)
SLP Cancellation Note  Patient Details Name: Brad Delacruz MRN: [id number] DOB: 1961/01/12   Cancelled treatment:        Pt on vent. Per MD notes, agitated, withdrawing. Not appropriate for ST intervention presently. Will follow.    Royce Macadamia 03/25/2020, 8:10 AM   Breck Coons Lonell Face.Ed Nurse, children's [phone] Office [phone]

## 2020-03-25 NOTE — Progress Notes (Signed)
NAME:  Brad Delacruz, MRN:  [id number], DOB:  Dec 08, 1960, LOS: 1 ADMISSION DATE:  03/24/2020, CONSULTATION DATE:  03/24/20 REFERRING MD:  Estanislado Pandy, CHIEF COMPLAINT:  Vent management   Brief History   59yo male with hx DM, HTN presented 4/20 to [facility] with sudden onset R sided weakness and aphasia.  SBP >200.  CT revealed dense L MCA occlusion.  He was tx urgently to [facility], IV tPA given and underwent IR revascularization of occluded L MCA.  Pt left intubated post procedure and PCCM consulted for vent and ICU management.   History of present illness   59yo male with hx DM, HTN presented 4/20 to [facility] with sudden onset R sided weakness and aphasia.  SBP >200.  CT revealed dense L MCA occlusion.  He was tx urgently to [facility], IV tPA given and underwent IR revascularization of occluded L MCA.  Pt left intubated post procedure and PCCM consulted for vent and ICU management.  Past Medical History  DM  Significant Hospital Events   4/20: Admitted  4/20: S/p tPa and intracranial thrombectomy   Consults:  PCCM  Procedures:  4/20: Left MCA revascularization   Significant Diagnostic Tests:  CTA head 4/20>>>  1. Large left MCA territory nonhemorrhagic infarct. 2. Acute left M1 large vessel occlusion. 3. Large penumbra with mismatch volume of 99 mL. 4. Collateral vessels are present within the sylvian fissure.  Micro Data:  4/20: SARS COVID negative   Antimicrobials:  None   Interim history/subjective:  Difficulties with BP control overnight. Agitated. Started on versed 10 mg/hr.   Objective   Blood pressure 130/70, pulse 77, temperature 98.7 F (37.1 C), temperature source Oral, resp. rate 18, height 5\' 5"  (1.651 m), weight 83.8 kg, SpO2 100 %.    Vent Mode: PRVC FiO2 (%):  [40 %-100 %] 40 % Set Rate:  [18 bmp] 18 bmp Vt Set:  [490 mL] 490 mL PEEP:  [5 cmH20] 5 cmH20 Plateau Pressure:  [9 cmH20-18 cmH20] 14 cmH20   Intake/Output Summary (Last 24 hours) at 03/25/2020  0651 Last data filed at 03/25/2020 0600 Gross per 24 hour  Intake 3860.5 ml  Output 2705 ml  Net 1155.5 ml   Filed Weights   03/24/20 0607 03/25/20 0323  Weight: 85.9 kg 83.8 kg   Examination: General: Well nourished male, sedated  HENT: Normocephalic, atraumatic, ETT in place Pulm: Good air movement with no wheezing or crackles  CV: RRR, no murmurs, no rubs  Abdomen: Active bowel sounds, soft, non-distended, no tenderness to palpation  Extremities: Pulses palpable in all extremities, no LE edema  Skin: Warm and dry  Neuro: Sedated   Resolved Hospital Problem list   None  Assessment & Plan:   Acute L MCA stroke - s/p IV tPA and IR revascularization  - Per stroke team  - BP control - Goal SBP 120-140 - F/u imaging in am 24h post tPA   Acute respiratory failure - post neuro IR in setting L MCA stroke  - On minimal vent settings  - Heavily sedated due to BP control and agitation. Currently on propofol and versed.  - Will not plan SBT until sedation has been weaned more. Will discuss with neuro  Hx HTN - cleviprex gtt required on arrival - SBP goal between 120-140  - Alternating between Cleveprex and Levo   Hx Type 2 DM  - CBG goal < 180  - Improving with SSI, continue to monitor   Best practice:  Diet: NPO Pain/Anxiety/Delirium  protocol (if indicated): PAD protocol - propofol gtt VAP protocol (if indicated): ordered  DVT prophylaxis: SCD's GI prophylaxis: pepcid Glucose control: SSI Mobility: BR Code Status: full Family Communication: Per primary Disposition:   Labs   CBC: Recent Labs  Lab 03/24/20 0601 03/24/20 0613 03/24/20 1209 03/25/20 0307  WBC 5.4  --   --  11.3*  NEUTROABS 3.0  --   --  9.6*  HGB 14.6 13.9 11.9* 13.0  HCT 40.4 41.0 35.0* 36.3*  MCV 88.4  --   --  89.0  PLT 120*  --   --  135*    Basic Metabolic Panel: Recent Labs  Lab 03/24/20 0601 03/24/20 0613 03/24/20 1209 03/25/20 0307  NA 130* 133* 136 138  K 3.3* 3.3* 4.1  3.3*  CL 94* 96*  --  107  CO2 26  --   --  22  GLUCOSE 309* 293*  --  186*  BUN 20 20  --  12  CREATININE 0.94 0.90  --  0.91  CALCIUM 8.9  --   --  8.6*   GFR: Estimated Creatinine Clearance: 87 mL/min (by C-G formula based on SCr of 0.91 mg/dL). Recent Labs  Lab 03/24/20 0601 03/25/20 0307  WBC 5.4 11.3*    Liver Function Tests: Recent Labs  Lab 03/24/20 0601  AST 21  ALT 27  ALKPHOS 90  BILITOT 0.7  PROT 6.5  ALBUMIN 3.8   No results for input(s): LIPASE, AMYLASE in the last 168 hours. No results for input(s): AMMONIA in the last 168 hours.  ABG    Component Value Date/Time   PHART 7.361 03/24/2020 1209   PCO2ART 39.5 03/24/2020 1209   PO2ART 257.0 (H) 03/24/2020 1209   HCO3 22.4 03/24/2020 1209   TCO2 24 03/24/2020 1209   ACIDBASEDEF 3.0 (H) 03/24/2020 1209   O2SAT 100.0 03/24/2020 1209     Coagulation Profile: Recent Labs  Lab 03/24/20 0601  INR 1.0    Cardiac Enzymes: No results for input(s): CKTOTAL, CKMB, CKMBINDEX, TROPONINI in the last 168 hours.  HbA1C: Hgb A1c MFr Bld  Date/Time Value Ref Range Status  03/24/2020 02:58 PM 9.2 (H) 4.8 - 5.6 % Final    Comment:    (NOTE) Pre diabetes:          5.7%-6.4% Diabetes:              >6.4% Glycemic control for   <7.0% adults with diabetes     CBG: Recent Labs  Lab 03/24/20 1146 03/24/20 1539 03/24/20 2009 03/25/20 0015 03/25/20 0318  GLUCAP 248* 291* 194* 220* 180*    Review of Systems:   Unable to obtain due to critical illness.  Past Medical History  He,  has a past medical history of Diabetes mellitus without complication (HCC).   Surgical History   History reviewed. No pertinent surgical history.   Social History   reports that he has never smoked. He has never used smokeless tobacco. He reports previous alcohol use. He reports that he does not use drugs.   Family History   His family history is not on file.   Allergies No Known Allergies   Home Medications  Prior  to Admission medications   Medication Sig Start Date End Date Taking? Authorizing Provider  ciclopirox (PENLAC) 8 % solution Apply topically as directed. 10/03/18   [provider]  dapagliflozin propanediol (FARXIGA) 10 MG TABS tablet Take 10 mg by mouth daily. 02/11/18   [provider]  glipiZIDE (  GLUCOTROL XL) 5 MG 24 hr tablet Take 5 mg by mouth 2 (two) times daily. 01/10/20   [provider]  losartan (COZAAR) 25 MG tablet Take 25 mg by mouth daily. 01/23/19   [provider]  meloxicam (MOBIC) 15 MG tablet Take 15 mg by mouth daily. 11/23/19   [provider]  pioglitazone (ACTOS) 30 MG tablet Take 30 mg by mouth daily. 12/28/19   [provider]  pravastatin (PRAVACHOL) 10 MG tablet Take 10 mg by mouth daily. 09/07/18   [provider]  rosuvastatin (CRESTOR) 5 MG tablet Take 5 mg by mouth daily. 12/31/19   [provider]  XIGDUO XR 09-999 MG TB24 Take 1 tablet by mouth daily. 01/24/19   [provider]        Levora Dredge, MD  [facility] PGY3  Pager: [phone]

## 2020-03-26 ENCOUNTER — Inpatient Hospital Stay (HOSPITAL_COMMUNITY): Payer: BC Managed Care – PPO

## 2020-03-26 DIAGNOSIS — J9600 Acute respiratory failure, unspecified whether with hypoxia or hypercapnia: Secondary | ICD-10-CM | POA: Diagnosis not present

## 2020-03-26 DIAGNOSIS — I639 Cerebral infarction, unspecified: Secondary | ICD-10-CM

## 2020-03-26 DIAGNOSIS — I609 Nontraumatic subarachnoid hemorrhage, unspecified: Secondary | ICD-10-CM

## 2020-03-26 DIAGNOSIS — E7800 Pure hypercholesterolemia, unspecified: Secondary | ICD-10-CM

## 2020-03-26 DIAGNOSIS — G936 Cerebral edema: Secondary | ICD-10-CM

## 2020-03-26 LAB — CBC
HCT: 38.4 % — ABNORMAL LOW (ref 39.0–52.0)
Hemoglobin: 13.5 g/dL (ref 13.0–17.0)
MCH: 32 pg (ref 26.0–34.0)
MCHC: 35.2 g/dL (ref 30.0–36.0)
MCV: 91 fL (ref 80.0–100.0)
Platelets: 116 10*3/uL — ABNORMAL LOW (ref 150–400)
RBC: 4.22 MIL/uL (ref 4.22–5.81)
RDW: 13.2 % (ref 11.5–15.5)
WBC: 11.1 10*3/uL — ABNORMAL HIGH (ref 4.0–10.5)
nRBC: 0 % (ref 0.0–0.2)

## 2020-03-26 LAB — GLUCOSE, CAPILLARY
Glucose-Capillary: 114 mg/dL — ABNORMAL HIGH (ref 70–99)
Glucose-Capillary: 112 mg/dL — ABNORMAL HIGH (ref 70–99)
Glucose-Capillary: 134 mg/dL — ABNORMAL HIGH (ref 70–99)
Glucose-Capillary: 142 mg/dL — ABNORMAL HIGH (ref 70–99)
Glucose-Capillary: 151 mg/dL — ABNORMAL HIGH (ref 70–99)
Glucose-Capillary: 188 mg/dL — ABNORMAL HIGH (ref 70–99)

## 2020-03-26 LAB — BASIC METABOLIC PANEL
Anion gap: 11 (ref 5–15)
BUN: 16 mg/dL (ref 6–20)
CO2: 22 mmol/L (ref 22–32)
Calcium: 9.2 mg/dL (ref 8.9–10.3)
Chloride: 110 mmol/L (ref 98–111)
Creatinine, Ser: 0.96 mg/dL (ref 0.61–1.24)
GFR calc Af Amer: >60 mL/min (ref >60)
GFR calc non Af Amer: >60 mL/min (ref >60)
Glucose, Bld: 126 mg/dL — ABNORMAL HIGH (ref 70–99)
Potassium: 3.8 mmol/L (ref 3.5–5.1)
Sodium: 143 mmol/L (ref 135–145)

## 2020-03-26 LAB — TRIGLYCERIDES: Triglycerides: 269 mg/dL — ABNORMAL HIGH (ref <150)

## 2020-03-26 MED ORDER — SODIUM CHLORIDE 0.9 % IV SOLN: INTRAVENOUS | Status: DC

## 2020-03-26 MED ORDER — PRO-STAT SUGAR FREE PO LIQD
30.0000 mL | Freq: Two times a day (BID) | ORAL | Status: DC
  Administered 2020-03-26 – 2020-03-27 (×3): 30 mL
  Filled 2020-03-26 (×3): qty 30

## 2020-03-26 MED ORDER — LABETALOL HCL 5 MG/ML IV SOLN
10.0000 mg | INTRAVENOUS | Status: AC | PRN
  Administered 2020-03-27 (×4): 20 mg via INTRAVENOUS
  Administered 2020-03-28 (×2): 10 mg via INTRAVENOUS
  Administered 2020-03-28 – 2020-04-07 (×4): 20 mg via INTRAVENOUS
  Filled 2020-03-26 (×8): qty 4

## 2020-03-26 MED ORDER — ORAL CARE MOUTH RINSE
15.0000 mL | Freq: Two times a day (BID) | OROMUCOSAL | Status: DC
  Administered 2020-03-26 – 2020-03-29 (×7): 15 mL via OROMUCOSAL

## 2020-03-26 MED ORDER — DEXTROSE IN LACTATED RINGERS 5 % IV SOLN: INTRAVENOUS | Status: DC

## 2020-03-26 MED ORDER — HYDRALAZINE HCL 20 MG/ML IJ SOLN
5.0000 mg | INTRAMUSCULAR | Status: DC | PRN
  Administered 2020-03-26 – 2020-04-07 (×7): 10 mg via INTRAVENOUS
  Filled 2020-03-26 (×8): qty 1

## 2020-03-26 MED ORDER — VITAL HIGH PROTEIN PO LIQD
1000.0000 mL | ORAL | Status: DC
  Administered 2020-03-26: 1000 mL

## 2020-03-26 NOTE — Progress Notes (Signed)
Referring Physician(s): Code Stroke- Milon Dikes  Supervising Physician: Julieanne Cotton  Patient Status:  [facility] - In-pt  Chief Complaint: None- intubated with sedation  Subjective: Remains intubated.  Does not follow commands.  Moves left side spontaneously. Withdrawals right side to pain only.    Allergies: Patient has no known allergies.  Medications: Prior to Admission medications   Medication Sig Start Date End Date Taking? Authorizing Provider  ciclopirox (PENLAC) 8 % solution Apply topically as directed. 10/03/18   [provider]  dapagliflozin propanediol (FARXIGA) 10 MG TABS tablet Take 10 mg by mouth daily. 02/11/18   [provider]  glipiZIDE (GLUCOTROL XL) 5 MG 24 hr tablet Take 5 mg by mouth 2 (two) times daily. 01/10/20   [provider]  losartan (COZAAR) 25 MG tablet Take 25 mg by mouth daily. 01/23/19   [provider]  meloxicam (MOBIC) 15 MG tablet Take 15 mg by mouth daily. 11/23/19   [provider]  pioglitazone (ACTOS) 30 MG tablet Take 30 mg by mouth daily. 12/28/19   [provider]  pravastatin (PRAVACHOL) 10 MG tablet Take 10 mg by mouth daily. 09/07/18   [provider]  rosuvastatin (CRESTOR) 5 MG tablet Take 5 mg by mouth daily. 12/31/19   [provider]  XIGDUO XR 09-999 MG TB24 Take 1 tablet by mouth daily. 01/24/19   [provider]     Vital Signs: BP (!) 151/72   Pulse (!) 58   Temp 98.7 F (37.1 C) (Axillary)   Resp 12   Ht 5\' 5"  (1.651 m)   Wt 184 lb 11.9 oz (83.8 kg)   SpO2 100%   BMI 30.74 kg/m   Physical Exam Vitals and nursing note reviewed.   Intubated. Not following commands. Moves left side freely.  Withdrawal to pain with right foot but no movement noted in right arm.  Groin soft bilaterally.  No evidence of hematoma or pseudoaneurysm.  Sheath removed.   Imaging: CT Angio Head W or Wo Contrast  Result Date: 03/24/2020 CLINICAL DATA:   Patient found down at 5 a.m. Right-sided weakness. Abnormal speech. Facial droop. EXAM: CT ANGIOGRAPHY HEAD AND NECK CT PERFUSION BRAIN TECHNIQUE: Multidetector CT imaging of the head and neck was performed using the standard protocol during bolus administration of intravenous contrast. Multiplanar CT image reconstructions and MIPs were obtained to evaluate the vascular anatomy. Carotid stenosis measurements (when applicable) are obtained utilizing NASCET criteria, using the distal internal carotid diameter as the denominator. Multiphase CT imaging of the brain was performed following IV bolus contrast injection. Subsequent parametric perfusion maps were calculated using RAPID software. CONTRAST:  03/26/2020 OMNIPAQUE IOHEXOL 350 MG/ML SOLN COMPARISON:  CT head without contrast 03/24/2020 FINDINGS: CTA NECK FINDINGS Aortic arch: A 3 vessel arch configuration is present. Atherosclerotic calcifications are present at the aortic arch. No aneurysm or significant stenosis is present. Right carotid system: Right common carotid artery is within normal limits. Atherosclerotic changes are present at the bifurcation without significant stenosis. Study is mildly degraded by dental artifact. Left carotid system: The left common carotid artery is within normal limits. Atherosclerotic changes are present at the proximal left ICA without a significant stenosis relative to the more distal vessel. Vertebral arteries: The right vertebral artery is the dominant vessel. Both vertebral arteries originate from the subclavian arteries without significant stenosis. Is no significant stenosis of either vertebral artery in the neck. Skeleton: Degenerative changes are present cervical spine with slight reversal of normal cervical  lordosis. No focal lytic or blastic lesions are present. Other neck: The soft tissues the neck are unremarkable. Upper chest: Mild dependent atelectasis is present. Thoracic inlet is normal. Review of the MIP images  confirms the above findings CTA HEAD FINDINGS Anterior circulation: Internal carotid arteries are within normal limits through the ICA termini bilaterally. The A1 segments are normal bilaterally. The anterior communicating artery is patent. Right M1 segment is normal. Left M1 segment is occluded proximal to the bifurcation. Collateral vessels are evident within the sylvian fissure. Bilateral ACA and right MCA branch vessels are within normal limits. Posterior circulation: Internal carotid right vertebral artery is the dominant vessel. The non dominant left vertebral artery terminates at the PICA. Right vertebral artery becomes the basilar artery. Both posterior cerebral arteries originate from basilar tip. Prominent left posterior communicating artery is present. PCA branch vessels are within normal limits bilaterally. Venous sinuses: The dural sinuses are patent. The straight sinus deep cerebral veins are intact. Cortical veins are within normal limits. Anatomic variants: Prominent left posterior communicating artery. Review of the MIP images confirms the above findings CT Brain Perfusion Findings: ASPECTS: 10/10 CBF (<30%) Volume: 83mL Perfusion (Tmax>6.0s) volume: 82mL Mismatch Volume: 99mL Infarction Location:Left MCA IMPRESSION: 1. Large left MCA territory nonhemorrhagic infarct. 2. Acute left M1 large vessel occlusion. 3. Large penumbra with mismatch volume of 99 mL. 4. Collateral vessels are present within the sylvian fissure. 5. Atherosclerotic changes at the carotid bifurcations bilaterally without significant stenosis. 6. Aortic Atherosclerosis (ICD10-I70.0). No significant stenosis at the arch. These results were called by telephone at the time of interpretation on 03/24/2020 at 6:13 am to provider [facility] , who verbally acknowledged these results. Electronically Signed   By: Marin Roberts M.D.   On: 03/24/2020 06:26   CT HEAD WO CONTRAST  Result Date: 03/25/2020 CLINICAL DATA:   59 year old male status post code stroke presentation yesterday with right side weakness, left MCA M1 ELV0. Status post endovascular revascularization including rescue stenting. Postprocedure subarachnoid blood versus contrast. EXAM: CT HEAD WITHOUT CONTRAST TECHNIQUE: Contiguous axial images were obtained from the base of the skull through the vertex without intravenous contrast. COMPARISON:  Postprocedure head CT 03/24/2020 and earlier. FINDINGS: Brain: Cytotoxic edema in the left hemisphere resembling the T-max greater than 6 volume on CTP yesterday (series 3, image 16). Expected evolution since 1830 hours yesterday. But only mild intracranial mass effect at this time with trace rightward midline shift. Persistent high density in the left subarachnoid space around the sylvian fissure more resembles contrast than hemorrhage. And configuration is unchanged since yesterday. No intraventricular hemorrhage. No ventriculomegaly. Gray-white matter differentiation remains normal in the right hemisphere and posterior fossa. Basilar cisterns are patent. Vascular: Left MCA M1 through bifurcation stent redemonstrated. Skull: No acute osseous abnormality identified. Sinuses/Orbits: Visualized paranasal sinuses and mastoids are stable and well pneumatized. Other: Fluid in the nasopharynx, intubated on the scalp view. Visualized orbits and scalp soft tissues are within normal limits. IMPRESSION: 1. Evolving large left MCA infarct with expected appearance compared to 1830 hours yesterday. 2. No definite hemorrhagic conversion, and only mild intracranial mass effect at this time - trace midline shift. 3. Persistent high density in the subarachnoid spaces in and around the left Sylvian fissure more resembles contrast than blood. 4. No new intracranial abnormality. Electronically Signed   By: Odessa Fleming M.D.   On: 03/25/2020 14:41   CT HEAD WO CONTRAST  Result Date: 03/24/2020 CLINICAL DATA:  Follow-up acute left cerebral  hemisphere infarct with  intervention. EXAM: CT HEAD WITHOUT CONTRAST TECHNIQUE: Contiguous axial images were obtained from the base of the skull through the vertex without intravenous contrast. COMPARISON:  Earlier today. FINDINGS: Brain: Again demonstrated is ill-defined low density and subarachnoid hemorrhage in the left middle cerebral and left posterior cerebral artery distributions. A small, old curvilinear infarct along the lateral margin of the left caudate head is again noted. Normal size and position of the ventricles. No shift of midline structures. Vascular: Left middle cerebral artery stent. Skull: Normal. Negative for fracture or focal lesion. Sinuses/Orbits: Unremarkable. Other: None. IMPRESSION: 1. Acute left middle cerebral and left posterior cerebral artery territory infarct without mass effect. 2. Stable subarachnoid hemorrhage on the left. 3. Stable left middle cerebral artery stent. 4. Stable small, old curvilinear infarct along the lateral margin of the left caudate head. Electronically Signed   By: Beckie SaltsSteven  Reid M.D.   On: 03/24/2020 18:45   CT Angio Neck W and/or Wo Contrast  Result Date: 03/24/2020 CLINICAL DATA:  Patient found down at 5 a.m. Right-sided weakness. Abnormal speech. Facial droop. EXAM: CT ANGIOGRAPHY HEAD AND NECK CT PERFUSION BRAIN TECHNIQUE: Multidetector CT imaging of the head and neck was performed using the standard protocol during bolus administration of intravenous contrast. Multiplanar CT image reconstructions and MIPs were obtained to evaluate the vascular anatomy. Carotid stenosis measurements (when applicable) are obtained utilizing NASCET criteria, using the distal internal carotid diameter as the denominator. Multiphase CT imaging of the brain was performed following IV bolus contrast injection. Subsequent parametric perfusion maps were calculated using RAPID software. CONTRAST:  100mL OMNIPAQUE IOHEXOL 350 MG/ML SOLN COMPARISON:  CT head without contrast  03/24/2020 FINDINGS: CTA NECK FINDINGS Aortic arch: A 3 vessel arch configuration is present. Atherosclerotic calcifications are present at the aortic arch. No aneurysm or significant stenosis is present. Right carotid system: Right common carotid artery is within normal limits. Atherosclerotic changes are present at the bifurcation without significant stenosis. Study is mildly degraded by dental artifact. Left carotid system: The left common carotid artery is within normal limits. Atherosclerotic changes are present at the proximal left ICA without a significant stenosis relative to the more distal vessel. Vertebral arteries: The right vertebral artery is the dominant vessel. Both vertebral arteries originate from the subclavian arteries without significant stenosis. Is no significant stenosis of either vertebral artery in the neck. Skeleton: Degenerative changes are present cervical spine with slight reversal of normal cervical lordosis. No focal lytic or blastic lesions are present. Other neck: The soft tissues the neck are unremarkable. Upper chest: Mild dependent atelectasis is present. Thoracic inlet is normal. Review of the MIP images confirms the above findings CTA HEAD FINDINGS Anterior circulation: Internal carotid arteries are within normal limits through the ICA termini bilaterally. The A1 segments are normal bilaterally. The anterior communicating artery is patent. Right M1 segment is normal. Left M1 segment is occluded proximal to the bifurcation. Collateral vessels are evident within the sylvian fissure. Bilateral ACA and right MCA branch vessels are within normal limits. Posterior circulation: Internal carotid right vertebral artery is the dominant vessel. The non dominant left vertebral artery terminates at the PICA. Right vertebral artery becomes the basilar artery. Both posterior cerebral arteries originate from basilar tip. Prominent left posterior communicating artery is present. PCA branch  vessels are within normal limits bilaterally. Venous sinuses: The dural sinuses are patent. The straight sinus deep cerebral veins are intact. Cortical veins are within normal limits. Anatomic variants: Prominent left posterior communicating artery. Review of the MIP  images confirms the above findings CT Brain Perfusion Findings: ASPECTS: 10/10 CBF (<30%) Volume: 83mL Perfusion (Tmax>6.0s) volume: 82mL Mismatch Volume: 99mL Infarction Location:Left MCA IMPRESSION: 1. Large left MCA territory nonhemorrhagic infarct. 2. Acute left M1 large vessel occlusion. 3. Large penumbra with mismatch volume of 99 mL. 4. Collateral vessels are present within the sylvian fissure. 5. Atherosclerotic changes at the carotid bifurcations bilaterally without significant stenosis. 6. Aortic Atherosclerosis (ICD10-I70.0). No significant stenosis at the arch. These results were called by telephone at the time of interpretation on 03/24/2020 at 6:13 am to provider [employer] , who verbally acknowledged these results. Electronically Signed   By: Marin Roberts M.D.   On: 03/24/2020 06:26   MR ANGIO HEAD WO CONTRAST  Result Date: 03/26/2020 CLINICAL DATA:  Follow-up examination for acute stroke. Previously identified left M1 occlusion, status post tPA and catheter directed revascularization with stent placement. EXAM: MRI HEAD WITHOUT CONTRAST MRA HEAD WITHOUT CONTRAST TECHNIQUE: Multiplanar, multiecho pulse sequences of the brain and surrounding structures were obtained without intravenous contrast. Angiographic images of the head were obtained using MRA technique without contrast. COMPARISON:  Prior head CT from earlier the same day as well as previous exams. FINDINGS: MRI HEAD FINDINGS Brain: Cerebral volume within normal limits for age. Minimal chronic microvascular ischemic disease noted within the supratentorial cerebral white matter. Large confluent area of restricted diffusion involving essentially the entirety of  the left MCA distribution seen involving the left cerebral hemisphere. Associated gyral swelling and edema throughout the area of infarction. Associated regional mass effect with early left-to-right midline shift measuring up to 3 mm. Scattered T1 hyperintensity with extensive susceptibility artifact throughout the left sylvian fissure felt to be most consistent with subarachnoid hemorrhage. Additional scattered foci of subarachnoid blood seen scattered within the cortical sulci through the area of infarction, although some of this susceptibility signal likely reflects engorgement of the cortical veins. Small amount of subarachnoid blood also seen along the suprasellar cistern and likely within the interpeduncular cistern. No frank hemorrhagic transformation or intraparenchymal hemorrhage. No other areas of acute or subacute infarct. Gray-white matter differentiation otherwise maintained. No other areas of chronic cortical infarction. No mass lesion or extra-axial fluid collection. No hydrocephalus or ventricular trapping. Vascular: Vascular stent in place within the left M1 segment. Major intracranial vascular flow voids otherwise maintained. Skull and upper cervical spine: Craniocervical junction within normal limits. Bone marrow signal intensity normal. No scalp soft tissue abnormality. Sinuses/Orbits: Globes and orbital soft tissues within normal limits. Mild scattered mucosal thickening noted within the ethmoidal air cells. Paranasal sinuses are otherwise clear. Fluid seen layering within the nasopharynx with trace bilateral mastoid effusions. Patient is intubated. Other: None. MRA HEAD FINDINGS ANTERIOR CIRCULATION: Visualized distal cervical segments of the internal carotid arteries are patent with fairly symmetric antegrade flow. Petrous segments widely patent bilaterally. Cavernous and supraclinoid right ICA are widely patent. On the left, there is a focal 2-3 mm filling defect involving the proximal  cavernous left ICA, suspicious for possible small amount of intraluminal thrombus (series 3, image 50). This is not seen on prior CTA. Associated moderate stenosis at this level. Cavernous and supraclinoid left ICA otherwise widely patent. ICA termini well perfused. A1 segments patent bilaterally. Normal anterior communicating artery. Anterior cerebral arteries are widely patent to their distal aspects. Right M1 widely patent. Normal right MCA bifurcation. Distal right MCA branches well perfused. On the left, vascular stent in place within the left M1 segment, extending across the left MCA bifurcation into proximal left  M2 branch. No discernible flow seen through the stent. No flow related signal seen distally within the left MCA branches, suggesting reocclusion. POSTERIOR CIRCULATION: Partially visualized right vertebral artery remains patent. 2 mm focal outpouching extending posteriorly from the vertebrobasilar junction noted, possibly a small aneurysm versus vascular infundibulum, stable (series 3, image 23). Hypoplastic left vertebral artery terminates in PICA, not well seen on this exam. Partially visualized picas appear patent. Basilar remains patent to its distal aspect without stenosis. Superior cerebral arteries patent bilaterally. Both PCAs primarily supplied via the basilar. PCAs remain well perfused to their distal aspects without stenosis. IMPRESSION: MRI HEAD IMPRESSION: 1. Continued interval evolution of large left MCA territory infarct. Associated cytotoxic edema with regional mass effect and 3 mm of left-to-right shift. 2. Scattered susceptibility artifact throughout the left sylvian fissure and left cerebral cortical sulci, most consistent with subarachnoid hemorrhage. Additional small volume subarachnoid blood also seen within the basilar cisterns. No frank intraparenchymal hemorrhage or hemorrhagic transformation. MRA HEAD IMPRESSION: 1. Vascular stent in place within the left M1 segment, with no  discernible flow through the stent or distally within the left MCA distribution, consistent with reocclusion. 2. Focal 2-3 mm filling defect within the proximal cavernous left ICA as above, suspicious for a small amount of intraluminal thrombus. This is new from previous. 3. 2 mm focal outpouching extending posteriorly from the vertebrobasilar junction, which could reflect a small vascular infundibulum versus aneurysm. Electronically Signed   By: Jeannine Boga M.D.   On: 03/26/2020 00:46   MR BRAIN WO CONTRAST  Result Date: 03/26/2020 CLINICAL DATA:  Follow-up examination for acute stroke. Previously identified left M1 occlusion, status post tPA and catheter directed revascularization with stent placement. EXAM: MRI HEAD WITHOUT CONTRAST MRA HEAD WITHOUT CONTRAST TECHNIQUE: Multiplanar, multiecho pulse sequences of the brain and surrounding structures were obtained without intravenous contrast. Angiographic images of the head were obtained using MRA technique without contrast. COMPARISON:  Prior head CT from earlier the same day as well as previous exams. FINDINGS: MRI HEAD FINDINGS Brain: Cerebral volume within normal limits for age. Minimal chronic microvascular ischemic disease noted within the supratentorial cerebral white matter. Large confluent area of restricted diffusion involving essentially the entirety of the left MCA distribution seen involving the left cerebral hemisphere. Associated gyral swelling and edema throughout the area of infarction. Associated regional mass effect with early left-to-right midline shift measuring up to 3 mm. Scattered T1 hyperintensity with extensive susceptibility artifact throughout the left sylvian fissure felt to be most consistent with subarachnoid hemorrhage. Additional scattered foci of subarachnoid blood seen scattered within the cortical sulci through the area of infarction, although some of this susceptibility signal likely reflects engorgement of the  cortical veins. Small amount of subarachnoid blood also seen along the suprasellar cistern and likely within the interpeduncular cistern. No frank hemorrhagic transformation or intraparenchymal hemorrhage. No other areas of acute or subacute infarct. Gray-white matter differentiation otherwise maintained. No other areas of chronic cortical infarction. No mass lesion or extra-axial fluid collection. No hydrocephalus or ventricular trapping. Vascular: Vascular stent in place within the left M1 segment. Major intracranial vascular flow voids otherwise maintained. Skull and upper cervical spine: Craniocervical junction within normal limits. Bone marrow signal intensity normal. No scalp soft tissue abnormality. Sinuses/Orbits: Globes and orbital soft tissues within normal limits. Mild scattered mucosal thickening noted within the ethmoidal air cells. Paranasal sinuses are otherwise clear. Fluid seen layering within the nasopharynx with trace bilateral mastoid effusions. Patient is intubated. Other: None. MRA HEAD FINDINGS  ANTERIOR CIRCULATION: Visualized distal cervical segments of the internal carotid arteries are patent with fairly symmetric antegrade flow. Petrous segments widely patent bilaterally. Cavernous and supraclinoid right ICA are widely patent. On the left, there is a focal 2-3 mm filling defect involving the proximal cavernous left ICA, suspicious for possible small amount of intraluminal thrombus (series 3, image 50). This is not seen on prior CTA. Associated moderate stenosis at this level. Cavernous and supraclinoid left ICA otherwise widely patent. ICA termini well perfused. A1 segments patent bilaterally. Normal anterior communicating artery. Anterior cerebral arteries are widely patent to their distal aspects. Right M1 widely patent. Normal right MCA bifurcation. Distal right MCA branches well perfused. On the left, vascular stent in place within the left M1 segment, extending across the left MCA  bifurcation into proximal left M2 branch. No discernible flow seen through the stent. No flow related signal seen distally within the left MCA branches, suggesting reocclusion. POSTERIOR CIRCULATION: Partially visualized right vertebral artery remains patent. 2 mm focal outpouching extending posteriorly from the vertebrobasilar junction noted, possibly a small aneurysm versus vascular infundibulum, stable (series 3, image 23). Hypoplastic left vertebral artery terminates in PICA, not well seen on this exam. Partially visualized picas appear patent. Basilar remains patent to its distal aspect without stenosis. Superior cerebral arteries patent bilaterally. Both PCAs primarily supplied via the basilar. PCAs remain well perfused to their distal aspects without stenosis. IMPRESSION: MRI HEAD IMPRESSION: 1. Continued interval evolution of large left MCA territory infarct. Associated cytotoxic edema with regional mass effect and 3 mm of left-to-right shift. 2. Scattered susceptibility artifact throughout the left sylvian fissure and left cerebral cortical sulci, most consistent with subarachnoid hemorrhage. Additional small volume subarachnoid blood also seen within the basilar cisterns. No frank intraparenchymal hemorrhage or hemorrhagic transformation. MRA HEAD IMPRESSION: 1. Vascular stent in place within the left M1 segment, with no discernible flow through the stent or distally within the left MCA distribution, consistent with reocclusion. 2. Focal 2-3 mm filling defect within the proximal cavernous left ICA as above, suspicious for a small amount of intraluminal thrombus. This is new from previous. 3. 2 mm focal outpouching extending posteriorly from the vertebrobasilar junction, which could reflect a small vascular infundibulum versus aneurysm. Electronically Signed   By: Rise Mu M.D.   On: 03/26/2020 00:46   CT CEREBRAL PERFUSION W CONTRAST  Result Date: 03/24/2020 CLINICAL DATA:  Patient found  down at 5 a.m. Right-sided weakness. Abnormal speech. Facial droop. EXAM: CT ANGIOGRAPHY HEAD AND NECK CT PERFUSION BRAIN TECHNIQUE: Multidetector CT imaging of the head and neck was performed using the standard protocol during bolus administration of intravenous contrast. Multiplanar CT image reconstructions and MIPs were obtained to evaluate the vascular anatomy. Carotid stenosis measurements (when applicable) are obtained utilizing NASCET criteria, using the distal internal carotid diameter as the denominator. Multiphase CT imaging of the brain was performed following IV bolus contrast injection. Subsequent parametric perfusion maps were calculated using RAPID software. CONTRAST:  OMNIPAQUE IOHEXOL 350 MG/ML SOLN COMPARISON:  CT head without contrast 03/24/2020 FINDINGS: CTA NECK FINDINGS Aortic arch: A 3 vessel arch configuration is present. Atherosclerotic calcifications are present at the aortic arch. No aneurysm or significant stenosis is present. Right carotid system: Right common carotid artery is within normal limits. Atherosclerotic changes are present at the bifurcation without significant stenosis. Study is mildly degraded by dental artifact. Left carotid system: The left common carotid artery is within normal limits. Atherosclerotic changes are present at the proximal left ICA  without a significant stenosis relative to the more distal vessel. Vertebral arteries: The right vertebral artery is the dominant vessel. Both vertebral arteries originate from the subclavian arteries without significant stenosis. Is no significant stenosis of either vertebral artery in the neck. Skeleton: Degenerative changes are present cervical spine with slight reversal of normal cervical lordosis. No focal lytic or blastic lesions are present. Other neck: The soft tissues the neck are unremarkable. Upper chest: Mild dependent atelectasis is present. Thoracic inlet is normal. Review of the MIP images confirms the above  findings CTA HEAD FINDINGS Anterior circulation: Internal carotid arteries are within normal limits through the ICA termini bilaterally. The A1 segments are normal bilaterally. The anterior communicating artery is patent. Right M1 segment is normal. Left M1 segment is occluded proximal to the bifurcation. Collateral vessels are evident within the sylvian fissure. Bilateral ACA and right MCA branch vessels are within normal limits. Posterior circulation: Internal carotid right vertebral artery is the dominant vessel. The non dominant left vertebral artery terminates at the PICA. Right vertebral artery becomes the basilar artery. Both posterior cerebral arteries originate from basilar tip. Prominent left posterior communicating artery is present. PCA branch vessels are within normal limits bilaterally. Venous sinuses: The dural sinuses are patent. The straight sinus deep cerebral veins are intact. Cortical veins are within normal limits. Anatomic variants: Prominent left posterior communicating artery. Review of the MIP images confirms the above findings CT Brain Perfusion Findings: ASPECTS: 10/10 CBF (<30%) Volume: 83mL Perfusion (Tmax>6.0s) volume: 82mL Mismatch Volume: 99mL Infarction Location:Left MCA IMPRESSION: 1. Large left MCA territory nonhemorrhagic infarct. 2. Acute left M1 large vessel occlusion. 3. Large penumbra with mismatch volume of 99 mL. 4. Collateral vessels are present within the sylvian fissure. 5. Atherosclerotic changes at the carotid bifurcations bilaterally without significant stenosis. 6. Aortic Atherosclerosis (ICD10-I70.0). No significant stenosis at the arch. These results were called by telephone at the time of interpretation on 03/24/2020 at 6:13 am to provider [facility] , who verbally acknowledged these results. Electronically Signed   By: Marin Roberts M.D.   On: 03/24/2020 06:26   Portable Chest xray  Result Date: 03/25/2020 CLINICAL DATA:  Respiratory failure.  Left MCA infarct. EXAM: PORTABLE CHEST 1 VIEW COMPARISON:  One-view chest x-ray 06/13/2008 FINDINGS: Heart size is normal. Lung volumes are low. The patient is intubated. Endotracheal tube terminates 4 cm above the carina. NG tube courses off the inferior border the film. IMPRESSION: 1. Endotracheal tube terminates 4 cm above the carina. 2. Low lung volumes. Electronically Signed   By: Marin Roberts M.D.   On: 03/25/2020 06:05   IR PATIENT EVAL TECH 0-60 MINS  Result Date: 03/25/2020 Carlyon Prows     03/25/2020 10:42 AM Left 4 french sheath removal:  No hematoma and pulses present prior to sheath removal.  Sheath removed at 1003 and held manual pressure for 15 minutes. No complications, pulses present and groin site level 0.  Verified groin site with [facility]. Lynann Beaver RT VI, R  ECHOCARDIOGRAM COMPLETE  Result Date: 03/24/2020    ECHOCARDIOGRAM REPORT   Patient Name:   Brad Delacruz Date of Exam: 03/24/2020 Medical Rec #:  [id number]        Height:       65.0 in Accession #:    [id number]       Weight:       189.4 lb Date of Birth:  [birth year]       BSA:  1.933 m Patient Age:    58 years         BP:           119/53 mmHg Patient Gender: M                HR:           56 bpm. Exam Location:  Inpatient Procedure: 2D Echo, Cardiac Doppler and Color Doppler Indications:    Stroke 434.91 / I163.9  History:        Patient has no prior history of Echocardiogram examinations.                 Risk Factors:Diabetes.  Sonographer:    Elmarie Shiley Dance Referring Phys: [id number] ASHISH ARORA IMPRESSIONS  1. Normal LV systolic function; grade 1 diastolic dysfunction.  2. Left ventricular ejection fraction, by estimation, is 55 to 60%. The left ventricle has normal function. The left ventricle has no regional wall motion abnormalities. Left ventricular diastolic parameters are consistent with Grade I diastolic dysfunction (impaired relaxation).  3. Right ventricular systolic function is normal. The right  ventricular size is normal.  4. The mitral valve is normal in structure. No evidence of mitral valve regurgitation. No evidence of mitral stenosis.  5. The aortic valve is tricuspid. Aortic valve regurgitation is not visualized. Mild aortic valve sclerosis is present, with no evidence of aortic valve stenosis. FINDINGS  Left Ventricle: Left ventricular ejection fraction, by estimation, is 55 to 60%. The left ventricle has normal function. The left ventricle has no regional wall motion abnormalities. The left ventricular internal cavity size was normal in size. There is  no left ventricular hypertrophy. Left ventricular diastolic parameters are consistent with Grade I diastolic dysfunction (impaired relaxation). Right Ventricle: The right ventricular size is normal. Right ventricular systolic function is normal. Left Atrium: Left atrial size was normal in size. Right Atrium: Right atrial size was normal in size. Pericardium: There is no evidence of pericardial effusion. Mitral Valve: The mitral valve is normal in structure. Normal mobility of the mitral valve leaflets. No evidence of mitral valve regurgitation. No evidence of mitral valve stenosis. Tricuspid Valve: The tricuspid valve is normal in structure. Tricuspid valve regurgitation is not demonstrated. No evidence of tricuspid stenosis. Aortic Valve: The aortic valve is tricuspid. Aortic valve regurgitation is not visualized. Mild aortic valve sclerosis is present, with no evidence of aortic valve stenosis. Pulmonic Valve: The pulmonic valve was normal in structure. Pulmonic valve regurgitation is not visualized. No evidence of pulmonic stenosis. Aorta: The aortic root is normal in size and structure. Venous: The inferior vena cava was not well visualized.  Additional Comments: Normal LV systolic function; grade 1 diastolic dysfunction.  LEFT VENTRICLE PLAX 2D LVIDd:         3.09 cm  Diastology LVIDs:         2.86 cm  LV e' lateral:   4.57 cm/s LV PW:          0.99 cm  LV E/e' lateral: 9.2 LV IVS:        0.94 cm  LV e' medial:    4.90 cm/s LVOT diam:     2.10 cm  LV E/e' medial:  8.6 LV SV:         62 LV SV Index:   32 LVOT Area:     3.46 cm  RIGHT VENTRICLE             IVC RV Basal diam:  2.12 cm  IVC diam: 2.18 cm RV S prime:     10.20 cm/s TAPSE (M-mode): 2.1 cm LEFT ATRIUM             Index       RIGHT ATRIUM           Index LA diam:        3.10 cm 1.60 cm/m  RA Area:     10.60 cm LA Vol (A2C):   35.2 ml 18.21 ml/m RA Volume:   17.40 ml  9.00 ml/m LA Vol (A4C):   35.0 ml 18.11 ml/m LA Biplane Vol: 35.2 ml 18.21 ml/m  AORTIC VALVE LVOT Vmax:   79.10 cm/s LVOT Vmean:  47.100 cm/s LVOT VTI:    0.180 m  AORTA Ao Root diam: 3.50 cm Ao Asc diam:  3.45 cm MITRAL VALVE MV Area (PHT): 1.78 cm    SHUNTS MV Decel Time: 426 msec    Systemic VTI:  0.18 m MV E velocity: 42.20 cm/s  Systemic Diam: 2.10 cm MV A velocity: 45.60 cm/s MV E/A ratio:  0.93 Olga Millers MD Electronically signed by Olga Millers MD Signature Date/Time: 03/24/2020/2:36:05 PM    Final    CT HEAD CODE STROKE WO CONTRAST  Result Date: 03/24/2020 CLINICAL DATA:  Code stroke. Ataxia. Right-sided weakness. Abnormal speech. Facial droop. Symptoms began at 5 a.m. EXAM: CT HEAD WITHOUT CONTRAST TECHNIQUE: Contiguous axial images were obtained from the base of the skull through the vertex without intravenous contrast. COMPARISON:  None. FINDINGS: Brain: No acute infarct, hemorrhage, or mass lesion is present. Remote infarct is present in the left corona radiata and anterior limb internal capsule. The basal ganglia are intact. Insular ribbon is normal. No acute hemorrhage or mass lesion is present. The ventricles are of normal size. No significant extraaxial fluid collection is present. The brainstem and cerebellum are within normal limits. Vascular: No hyperdense vessel or unexpected calcification. Skull: Calvarium is intact. No focal lytic or blastic lesions are present. No significant extracranial  soft tissue lesion is present. Sinuses/Orbits: The paranasal sinuses and mastoid air cells are clear. The globes and orbits are within normal limits. ASPECTS [facility] Stroke Program Early CT Score) - Ganglionic level infarction (caudate, lentiform nuclei, internal capsule, insula, M1-M3 cortex): 7/7 - Supraganglionic infarction (M4-M6 cortex): 3/3 Total score (0-10 with 10 being normal): 10/10 IMPRESSION: 1. No acute intracranial abnormality. 2. 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 3. ASPECTS is 10/10 These results were called by telephone at the time of interpretation on 03/24/2020 at 5:46 am to provider [facility] , who verbally acknowledged these results. Electronically Signed   By: Marin Roberts M.D.   On: 03/24/2020 05:46    Labs:  CBC: Recent Labs    03/24/20 0601 03/24/20 0613 03/24/20 1209 03/25/20 0307 03/25/20 1000 03/26/20 0556  WBC 5.4  --   --  11.3* 9.3 11.1*  HGB 14.6   < > 11.9* 13.0 12.9* 13.5  HCT 40.4   < > 35.0* 36.3* 36.2* 38.4*  PLT 120*  --   --  135* 118* 116*   < > = values in this interval not displayed.    COAGS: Recent Labs    03/24/20 0601  INR 1.0  APTT 24    BMP: Recent Labs    03/24/20 0601 03/24/20 0601 03/24/20 0613 03/24/20 0613 03/24/20 1209 03/25/20 0307 03/25/20 1000 03/26/20 0556  NA 130*   < > 133*   < > 136 138 137 143  K 3.3*   < > 3.3*   < > 4.1 3.3* 3.4* 3.8  CL 94*   < > 96*  --   --  107 106 110  CO2 26  --   --   --   --  22 21* 22  GLUCOSE 309*   < > 293*  --   --  186* 179* 126*  BUN 20   < > 20  --   --  12 11 16   CALCIUM 8.9  --   --   --   --  8.6* 8.5* 9.2  CREATININE 0.94   < > 0.90  --   --  0.91 0.90 0.96  GFRNONAA >60  --   --   --   --  >60 >60 >60  GFRAA >60  --   --   --   --  >60 >60 >60   < > = values in this interval not displayed.    LIVER FUNCTION TESTS: Recent Labs    03/24/20 0601 03/25/20 1000  BILITOT 0.7 1.0  AST 21 23  ALT 27 22  ALKPHOS 90 50    PROT 6.5 5.9*  ALBUMIN 3.8 3.2*    Assessment and Plan: History of acute CVA s/p cerebral arteriogram with emergent mechanical thrombectomy and rescue stenting of left MCA M1 segment occlusion achieving a TICI 2C revascularization 03/24/2020 by Dr. 03/26/2020. Patient remains intubated this AM.  Not following commands.  Imaging overnight shows evolution of large L MCA infarct with midline shift, SAH, and reocclusion of his stent with no discernable flow.  Brilinta not indicated as the stent is no longer patent.   Further plans per neurology/CCM- appreciate and agree with management. NIR to follow.  Electronically Signed: Corliss Skains, PA 03/26/2020, 10:04 AM   I spent a total of 25 Minutes at the the patient's bedside AND on the patient's hospital floor or unit, greater than 50% of which was counseling/coordinating care for L MCA CVA.

## 2020-03-26 NOTE — Progress Notes (Signed)
Wasted of fentanyl in sink w/ Francoise Schaumann, RN.

## 2020-03-26 NOTE — Progress Notes (Signed)
   D/with DR Roda Shutters  26mm midline shift yesterday and evolving large MCA infarct No indication for hemicraniectomy at this point  Plan  - ok to extubate if meets medical criteria - will send oextubation orders    SIGNATURE    Dr. Kalman Shan, M.D., F.C.C.P,  Pulmonary and Critical Care Medicine Staff Physician, [facility] Director - Interstitial Lung Disease  Program  Pulmonary Fibrosis [facility] at [facility] [place], [place], [address]  Pager: [phone], If no answer or between  15:00h - 7:00h: call [phone] Telephone: [phone]  10:43 AM 03/26/2020

## 2020-03-26 NOTE — Evaluation (Addendum)
Occupational Therapy Evaluation Patient Details Name: Brad Delacruz MRN: [id number] DOB: 15-Jan-1961 Today's Date: 03/26/2020    History of Present Illness 59 y.o. male past medical history of diabetes, hypertension which he does not take medication for, presented to the emergency room at [facility] with last known well of 5 AM when he was at work and was noted to have a sudden onset of right-sided weakness and inability to talk. Pt given TPA and s/p thrombectomy on 4/20. Pt remains intubated and restrained as of 4/22.   Clinical Impression   PTA, pt was living with his wife and daughter and was independent and working third shift; information collected later when family arrived. Pt currently requiring Total A for ADLs and bed mobility. Pt tolerating sitting at EOB with Max A and intermittent moments of Min A. Pt initiating certain movements such as bringing LUE to face for grooming and reaching during bed mobility. Pt seems to respond better to Spanish; family confirming that he speaks both [place] and [place]. Pt would benefit from further acute OT to facilitate safe dc. Per pt's PLOF, age, and good family support, recommend dc to CIR for intensive OT to optimize safety, independence with ADLs, and return to PLOF.     Follow Up Recommendations  CIR;Supervision/Assistance - 24 hour    Equipment Recommendations  Other (comment)(Defer to next venue)    Recommendations for Other Services PT consult;Rehab consult;Speech consult     Precautions / Restrictions Precautions Precautions: Fall Restrictions Weight Bearing Restrictions: No      Mobility Bed Mobility Overal bed mobility: Needs Assistance Bed Mobility: Supine to Sit;Sit to Supine     Supine to sit: Total assist;+2 for physical assistance Sit to supine: Total assist;+2 for physical assistance   General bed mobility comments: Total A to manage BLEs and elevate trunk  Transfers                       Balance Overall balance assessment: Needs assistance Sitting-balance support: Single extremity supported;Feet supported Sitting balance-Leahy Scale: Poor Sitting balance - Comments: maxA to maintain static sitting balance with brief perfiods of minA Postural control: Posterior lean;Right lateral lean                                 ADL either performed or assessed with clinical judgement   ADL Overall ADL's : Needs assistance/impaired     Grooming: Wash/dry face;Sitting;Total assistance Grooming Details (indicate cue type and reason): With cues in spanish to "wash face" pt initating bring wash clothe towards his face. Unable to bring LUE all the way to his face and requiring hand over hand                               General ADL Comments: Pt requiring Total A for ADLs and bed mobility.      Vision Baseline Vision/History: Wears glasses Wears Glasses: Reading only(wears them at work) Vision Assessment?: Vision impaired- to be further tested in functional context Additional Comments: Pt keeping his eye closed for majority of session. No tracking. tendency for left head turn and gaze.      Perception     Praxis      Pertinent Vitals/Pain Pain Assessment: Faces Faces Pain Scale: Hurts a little bit Pain Location: generalized Pain Descriptors / Indicators: Grimacing Pain Intervention(s): Monitored during session;Limited activity  within patient's tolerance;Repositioned     Hand Dominance Right   Extremity/Trunk Assessment Upper Extremity Assessment Upper Extremity Assessment: RUE deficits/detail RUE Deficits / Details: No active movement at RUE. No reaction to painful stimuli. WFL for PROM RUE Coordination: decreased fine motor;decreased gross motor   Lower Extremity Assessment Lower Extremity Assessment: Defer to PT evaluation RLE Deficits / Details: positive babinski test, otherwise flacid, no AROM noted LLE Deficits / Details: pt mobilizes  extremity against gravity, does follow command to lift leg (command given in spanish)   Cervical / Trunk Assessment Cervical / Trunk Assessment: Other exceptions Cervical / Trunk Exceptions: forward flexion at shoulders and tendency for head turn to left   Communication Communication Communication: Expressive difficulties;Receptive difficulties(may prefer Spanish, responded more consistently to spanish)   Cognition Arousal/Alertness: Lethargic Behavior During Therapy: Flat affect Overall Cognitive Status: Difficult to assess                                 General Comments: Pt seems to initate certain movements such as reaching towards right with LUE to sit at EOB or opening his lips for suction.   General Comments  Intubated on 40% FiO2. VSS.     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 Information collected from daughter        OT Problem List: Decreased strength;Decreased range of motion;Decreased activity tolerance;Impaired balance (sitting and/or standing);Impaired vision/perception;Decreased coordination;Decreased cognition;Decreased safety awareness;Decreased knowledge of use of DME or AE;Decreased knowledge of precautions;Impaired UE functional use;Pain      OT Treatment/Interventions: Self-care/ADL training;Therapeutic exercise;Energy  conservation;DME and/or AE instruction;Therapeutic activities;Patient/family education    OT Goals(Current goals can be found in the care plan section) Acute Rehab OT Goals Patient Stated Goal: Per family, return home safely OT Goal Formulation: With patient/family Time For Goal Achievement: 04/09/20 Potential to Achieve Goals: Good  OT Frequency: Min 2X/week   Barriers to D/C:            Co-evaluation PT/OT/SLP Co-Evaluation/Treatment: Yes Reason for Co-Treatment: For patient/therapist safety;To address functional/ADL transfers;Complexity of the patient's impairments (multi-system involvement) PT goals addressed during session: Mobility/safety with mobility;Balance;Strengthening/ROM OT goals addressed during session: ADL's and self-care      AM-PAC OT "6 Clicks" Daily Activity     Outcome Measure Help from another person eating meals?: Total Help from another person taking care of personal grooming?: Total Help from another person toileting, which includes using toliet, bedpan, or urinal?: Total Help from another person bathing (including washing, rinsing, drying)?: Total Help from another person to put on and taking off regular upper body clothing?: Total Help from another person to put on and taking off regular lower body clothing?: Total 6 Click Score: 6   End of Session Equipment Utilized During Treatment: Oxygen Nurse Communication: Mobility status  Activity Tolerance: Patient limited by fatigue Patient  left: in bed;with call bell/phone within reach;with bed alarm set;with restraints reapplied  OT Visit Diagnosis: Unsteadiness on feet (R26.81);Other abnormalities of gait and mobility (R26.89);Muscle weakness (generalized) (M62.81);Pain;Hemiplegia and hemiparesis Hemiplegia - Right/Left: Right Hemiplegia - dominant/non-dominant: Dominant Hemiplegia - caused by: Cerebral infarction Pain - part of body: (generalized)                Time: 1610-9604 OT Time Calculation  (min): 30 min Charges:  OT General Charges $OT Visit: 1 Visit OT Evaluation $OT Eval High Complexity: 1 High  Brad Delacruz MSOT, OTR/L Acute Rehab Pager: [phone] Office: [phone]  Brad Delacruz 03/26/2020, 1:02 PM

## 2020-03-26 NOTE — Evaluation (Addendum)
Physical Therapy Evaluation Patient Details Name: Brad Delacruz MRN: [id number] DOB: 10-10-1961 Today's Date: 03/26/2020   History of Present Illness  59 y.o. male past medical history of diabetes, hypertension which she does not take medication for, presented to the emergency room at [facility] with last known well of 5 AM when he was at work and was noted to have a sudden onset of right-sided weakness and inability to talk. Pt given TPA and s/p thrombectomy on 4/20. Pt remains intubated and restrained as of 4/22.  Clinical Impression  Pt presents to PT with deficits in functional mobility, gait, balance, endurance, strength, power, ROM, tone, cognition, communication. Pt follows commands with verbal cues in spanish and tactile cues, requiring increased time and multiple cues. Pt participation improves with initiation of task, for example opening mouth to aide in suction after suction device brought towards lips. Pt does not track with eyes during session, but does open eyes to stimulation during session. Pt mobilizes L side to command during session, no AROM noted on R side. Pt noted to have positive Babinski sign to RLE. PT ordering rigid PRAFO boot to be worn to prevent R ankle plantarflexion contracture. Pt will benefit from continued acute PT POC to reduce caregiver burden and improve functional mobility.    Follow Up Recommendations SNF;Supervision/Assistance - 24 hour    Equipment Recommendations  Wheelchair (measurements PT);Wheelchair cushion (measurements PT);Hospital bed;Other (comment)(mechanical lift)    Recommendations for Other Services       Precautions / Restrictions Precautions Precautions: Fall Restrictions Weight Bearing Restrictions: No      Mobility  Bed Mobility Overal bed mobility: Needs Assistance Bed Mobility: Supine to Sit;Sit to Supine     Supine to sit: Total assist;+2 for physical assistance Sit to supine: Total assist;+2 for physical  assistance      Transfers                    Ambulation/Gait                Stairs            Wheelchair Mobility    Modified Rankin (Stroke Patients Only) Modified Rankin (Stroke Patients Only) Pre-Morbid Rankin Score: No symptoms(need to confirm prior level of function) Modified Rankin: Moderately severe disability     Balance Overall balance assessment: Needs assistance Sitting-balance support: Single extremity supported;Feet supported Sitting balance-Leahy Scale: Poor Sitting balance - Comments: maxA to maintain static sitting balance with brief perfiods of minA Postural control: Posterior lean;Right lateral lean                                   Pertinent Vitals/Pain Pain Assessment: Faces Faces Pain Scale: Hurts a little bit Pain Location: generalized Pain Descriptors / Indicators: Grimacing Pain Intervention(s): Monitored during session    Home Living Family/patient expects to be discharged to:: (unable to obtain, pt unable to provide history this session)                      Prior Function           Comments: unable to determine     Hand Dominance        Extremity/Trunk Assessment   Upper Extremity Assessment Upper Extremity Assessment: Defer to OT evaluation    Lower Extremity Assessment Lower Extremity Assessment: RLE deficits/detail;LLE deficits/detail RLE Deficits / Details: positive babinski test, otherwise flacid,  no AROM noted LLE Deficits / Details: pt mobilizes extremity against gravity, does follow command to lift leg (command given in spanish)    Cervical / Trunk Assessment Cervical / Trunk Assessment: Kyphotic  Communication   Communication: Expressive difficulties;Receptive difficulties(may prefer Spanish, responded more consistently to spanish)  Cognition Arousal/Alertness: Lethargic Behavior During Therapy: Flat affect Overall Cognitive Status: Difficult to assess                                         General Comments General comments (skin integrity, edema, etc.): pt intubated with 40% FiO2, VSS during session, pt does not appear to track with eyes during session, does open eyes to verbal/tactile stimuli during session. Follows commands with verbal cues in spanish and tactile cues, does better when following with initiation of movement or task    Exercises     Assessment/Plan    PT Assessment Patient needs continued PT services  PT Problem List Decreased strength;Decreased activity tolerance;Decreased balance;Decreased mobility;Decreased coordination;Decreased cognition;Decreased knowledge of use of DME;Decreased safety awareness;Decreased knowledge of precautions;Cardiopulmonary status limiting activity;Impaired sensation;Impaired tone       PT Treatment Interventions DME instruction;Gait training;Stair training;Functional mobility training;Therapeutic activities;Therapeutic exercise;Balance training;Neuromuscular re-education;Cognitive remediation;Patient/family education    PT Goals (Current goals can be found in the Care Plan section)  Acute Rehab PT Goals Patient Stated Goal: pt unable to state, PT goal to improve functional mobility and strength PT Goal Formulation: Patient unable to participate in goal setting Time For Goal Achievement: 04/09/20 Potential to Achieve Goals: Fair    Frequency Min 3X/week   Barriers to discharge        Co-evaluation PT/OT/SLP Co-Evaluation/Treatment: Yes Reason for Co-Treatment: Complexity of the patient's impairments (multi-system involvement);Necessary to address cognition/behavior during functional activity;For patient/therapist safety;To address functional/ADL transfers PT goals addressed during session: Mobility/safety with mobility;Balance;Strengthening/ROM         AM-PAC PT "6 Clicks" Mobility  Outcome Measure Help needed turning from your back to your side while in a flat bed without  using bedrails?: Total Help needed moving from lying on your back to sitting on the side of a flat bed without using bedrails?: Total Help needed moving to and from a bed to a chair (including a wheelchair)?: Total Help needed standing up from a chair using your arms (e.g., wheelchair or bedside chair)?: Total Help needed to walk in hospital room?: Total Help needed climbing 3-5 steps with a railing? : Total 6 Click Score: 6    End of Session Equipment Utilized During Treatment: Oxygen Activity Tolerance: Patient limited by lethargy Patient left: in bed;with call bell/phone within reach;with bed alarm set;with restraints reapplied;with SCD's reapplied Nurse Communication: Mobility status PT Visit Diagnosis: Muscle weakness (generalized) (M62.81);Other symptoms and signs involving the nervous system (R29.898);Hemiplegia and hemiparesis Hemiplegia - Right/Left: Right Hemiplegia - caused by: Cerebral infarction    Time: 5643-3295 PT Time Calculation (min) (ACUTE ONLY): 18 min   Charges:   PT Evaluation $PT Eval High Complexity: 1 High          Arlyss Gandy, PT, DPT Acute Rehabilitation Pager: [phone]   Arlyss Gandy 03/26/2020, 11:16 AM

## 2020-03-26 NOTE — Progress Notes (Signed)
eLink Physician-Brief Progress Note Patient Name: Brad Delacruz DOB: [birth year] MRN: [id number]   Date of Service  03/26/2020  HPI/Events of Note  SBP exceeding parameter of < 160 mmHg despite PRN Hydralazine order on the [facility].  eICU Interventions  Labetalol 10 - 20 mg iv Q 2 hours PRN SBP > 160 mmHg ordered.        Migdalia Dk 03/26/2020, 11:46 PM

## 2020-03-26 NOTE — Progress Notes (Signed)
eLink Physician-Brief Progress Note Patient Name: Brad Delacruz DOB: Mar 17, 1961 MRN: [id number]   Date of Service  03/26/2020  HPI/Events of Note  Pt with > 900 ml urinary retention on bladder scan.  eICU Interventions  Foley catheter order placed if in / out cath does not resolve urinary retention.        Thomasene Lot Ogan 03/26/2020, 12:28 AM

## 2020-03-26 NOTE — Procedures (Signed)
Extubation Procedure Note  Patient Details:   Name: Brad Delacruz DOB: [birth year] MRN: [id number]   Airway Documentation:    Vent end date: 03/26/20 Vent end time: 1122   Evaluation  O2 sats: stable throughout Complications: No apparent complications Patient did tolerate procedure well. Bilateral Breath Sounds: Clear, Diminished    RT extubated patient to 3L [place] per MD order with RN at bedside. Patient sating 99% on 3L. Patient tolerated well. No stridor noted. RT will continue to monitor as needed.  Lura Em 03/26/2020, 11:28 AM

## 2020-03-26 NOTE — Progress Notes (Signed)
Orthopedic Tech Progress Note Patient Details:  Brad Delacruz Apr 27, 1961 [id number]  Ortho Devices Type of Ortho Device: Prafo boot/shoe Ortho Device/Splint Location: RLE Ortho Device/Splint Interventions: Ordered, Application   Post Interventions Patient Tolerated: Well Instructions Provided: Care of device   [name] 03/26/2020, 12:41 PM

## 2020-03-26 NOTE — Progress Notes (Signed)
STROKE TEAM PROGRESS NOTE   INTERVAL HISTORY No family at bedside. Pt was extubated this am. So far he tolerates well, but intermittent snoring. Still drowsy sleepy but arousable. Global aphasia and right hemiplegia. Spontaneous movement on the left. Left gaze and right neglect. MRI showed large left MCA infarcts with small SAH. MRA showed left MCA stent reoccluded.    Vitals:   03/26/20 0800 03/26/20 0801 03/26/20 0900 03/26/20 1000  BP: (!) 154/62  (!) 151/72 (!) 152/66  Pulse: 68 (!) 57 (!) 58 67  Resp: Temp: 98.7 F (37.1 C)     TempSrc: Axillary     SpO2: 100% 100% 100% 100%  Weight:      Height:        CBC:  Recent Labs  Lab 03/24/20 0601 03/24/20 0613 03/25/20 0307 03/25/20 0307 03/25/20 1000 03/26/20 0556  WBC 5.4   < > 11.3*   < > 9.3 11.1*  NEUTROABS 3.0  --  9.6*  --   --   --   HGB 14.6   < > 13.0   < > 12.9* 13.5  HCT 40.4   < > 36.3*   < > 36.2* 38.4*  MCV 88.4   < > 89.0   < > 88.9 91.0  PLT 120*   < > 135*   < > 118* 116*   < > = values in this interval not displayed.    Basic Metabolic Panel:  Recent Labs  Lab 03/25/20 1000 03/26/20 0556  NA 137 143  K 3.4* 3.8  CL 106 110  CO2 21* 22  GLUCOSE 179* 126*  BUN 11 16  CREATININE 0.90 0.96  CALCIUM 8.5* 9.2  MG 2.1  --   PHOS 2.4*  --    Lipid Panel:     Component Value Date/Time   CHOL 180 03/25/2020 0307   TRIG 269 (H) 03/26/2020 0556   HDL 21 (L) 03/25/2020 0307   CHOLHDL 8.6 03/25/2020 0307   VLDL UNABLE TO CALCULATE IF TRIGLYCERIDE OVER 400 mg/dL 16/09/9603 5409   LDLCALC UNABLE TO CALCULATE IF TRIGLYCERIDE OVER 400 mg/dL 81/19/1478 2956   OZHY8M:  Lab Results  Component Value Date   HGBA1C 9.2 (H) 03/24/2020   Urine Drug Screen:     Component Value Date/Time   LABOPIA NONE DETECTED 03/25/2020 0229   COCAINSCRNUR NONE DETECTED 03/25/2020 0229   LABBENZ POSITIVE (A) 03/25/2020 0229   AMPHETMU NONE DETECTED 03/25/2020 0229   THCU NONE DETECTED 03/25/2020 0229    LABBARB NONE DETECTED 03/25/2020 0229    Alcohol Level     Component Value Date/Time   ETH <10 03/24/2020 0601    IMAGING past 24 hours CT HEAD WO CONTRAST  Result Date: 03/25/2020 CLINICAL DATA:  59 year old male status post code stroke presentation yesterday with right side weakness, left MCA M1 ELV0. Status post endovascular revascularization including rescue stenting. Postprocedure subarachnoid blood versus contrast. EXAM: CT HEAD WITHOUT CONTRAST TECHNIQUE: Contiguous axial images were obtained from the base of the skull through the vertex without intravenous contrast. COMPARISON:  Postprocedure head CT 03/24/2020 and earlier. FINDINGS: Brain: Cytotoxic edema in the left hemisphere resembling the T-max greater than 6 volume on CTP yesterday (series 3, image 16). Expected evolution since 1830 hours yesterday. But only mild intracranial mass effect at this time with trace rightward midline shift. Persistent high density in the left subarachnoid space around the sylvian fissure more resembles contrast than hemorrhage. And configuration is unchanged since  yesterday. No intraventricular hemorrhage. No ventriculomegaly. Gray-white matter differentiation remains normal in the right hemisphere and posterior fossa. Basilar cisterns are patent. Vascular: Left MCA M1 through bifurcation stent redemonstrated. Skull: No acute osseous abnormality identified. Sinuses/Orbits: Visualized paranasal sinuses and mastoids are stable and well pneumatized. Other: Fluid in the nasopharynx, intubated on the scalp view. Visualized orbits and scalp soft tissues are within normal limits. IMPRESSION: 1. Evolving large left MCA infarct with expected appearance compared to 1830 hours yesterday. 2. No definite hemorrhagic conversion, and only mild intracranial mass effect at this time - trace midline shift. 3. Persistent high density in the subarachnoid spaces in and around the left Sylvian fissure more resembles contrast than  blood. 4. No new intracranial abnormality. Electronically Signed   By: Odessa FlemingH  Hall M.D.   On: 03/25/2020 14:41   MR ANGIO HEAD WO CONTRAST  Result Date: 03/26/2020 CLINICAL DATA:  Follow-up examination for acute stroke. Previously identified left M1 occlusion, status post tPA and catheter directed revascularization with stent placement. EXAM: MRI HEAD WITHOUT CONTRAST MRA HEAD WITHOUT CONTRAST TECHNIQUE: Multiplanar, multiecho pulse sequences of the brain and surrounding structures were obtained without intravenous contrast. Angiographic images of the head were obtained using MRA technique without contrast. COMPARISON:  Prior head CT from earlier the same day as well as previous exams. FINDINGS: MRI HEAD FINDINGS Brain: Cerebral volume within normal limits for age. Minimal chronic microvascular ischemic disease noted within the supratentorial cerebral white matter. Large confluent area of restricted diffusion involving essentially the entirety of the left MCA distribution seen involving the left cerebral hemisphere. Associated gyral swelling and edema throughout the area of infarction. Associated regional mass effect with early left-to-right midline shift measuring up to 3 mm. Scattered T1 hyperintensity with extensive susceptibility artifact throughout the left sylvian fissure felt to be most consistent with subarachnoid hemorrhage. Additional scattered foci of subarachnoid blood seen scattered within the cortical sulci through the area of infarction, although some of this susceptibility signal likely reflects engorgement of the cortical veins. Small amount of subarachnoid blood also seen along the suprasellar cistern and likely within the interpeduncular cistern. No frank hemorrhagic transformation or intraparenchymal hemorrhage. No other areas of acute or subacute infarct. Gray-white matter differentiation otherwise maintained. No other areas of chronic cortical infarction. No mass lesion or extra-axial fluid  collection. No hydrocephalus or ventricular trapping. Vascular: Vascular stent in place within the left M1 segment. Major intracranial vascular flow voids otherwise maintained. Skull and upper cervical spine: Craniocervical junction within normal limits. Bone marrow signal intensity normal. No scalp soft tissue abnormality. Sinuses/Orbits: Globes and orbital soft tissues within normal limits. Mild scattered mucosal thickening noted within the ethmoidal air cells. Paranasal sinuses are otherwise clear. Fluid seen layering within the nasopharynx with trace bilateral mastoid effusions. Patient is intubated. Other: None. MRA HEAD FINDINGS ANTERIOR CIRCULATION: Visualized distal cervical segments of the internal carotid arteries are patent with fairly symmetric antegrade flow. Petrous segments widely patent bilaterally. Cavernous and supraclinoid right ICA are widely patent. On the left, there is a focal 2-3 mm filling defect involving the proximal cavernous left ICA, suspicious for possible small amount of intraluminal thrombus (series 3, image 50). This is not seen on prior CTA. Associated moderate stenosis at this level. Cavernous and supraclinoid left ICA otherwise widely patent. ICA termini well perfused. A1 segments patent bilaterally. Normal anterior communicating artery. Anterior cerebral arteries are widely patent to their distal aspects. Right M1 widely patent. Normal right MCA bifurcation. Distal right MCA branches well perfused. On  the left, vascular stent in place within the left M1 segment, extending across the left MCA bifurcation into proximal left M2 branch. No discernible flow seen through the stent. No flow related signal seen distally within the left MCA branches, suggesting reocclusion. POSTERIOR CIRCULATION: Partially visualized right vertebral artery remains patent. 2 mm focal outpouching extending posteriorly from the vertebrobasilar junction noted, possibly a small aneurysm versus vascular  infundibulum, stable (series 3, image 23). Hypoplastic left vertebral artery terminates in PICA, not well seen on this exam. Partially visualized picas appear patent. Basilar remains patent to its distal aspect without stenosis. Superior cerebral arteries patent bilaterally. Both PCAs primarily supplied via the basilar. PCAs remain well perfused to their distal aspects without stenosis. IMPRESSION: MRI HEAD IMPRESSION: 1. Continued interval evolution of large left MCA territory infarct. Associated cytotoxic edema with regional mass effect and 3 mm of left-to-right shift. 2. Scattered susceptibility artifact throughout the left sylvian fissure and left cerebral cortical sulci, most consistent with subarachnoid hemorrhage. Additional small volume subarachnoid blood also seen within the basilar cisterns. No frank intraparenchymal hemorrhage or hemorrhagic transformation. MRA HEAD IMPRESSION: 1. Vascular stent in place within the left M1 segment, with no discernible flow through the stent or distally within the left MCA distribution, consistent with reocclusion. 2. Focal 2-3 mm filling defect within the proximal cavernous left ICA as above, suspicious for a small amount of intraluminal thrombus. This is new from previous. 3. 2 mm focal outpouching extending posteriorly from the vertebrobasilar junction, which could reflect a small vascular infundibulum versus aneurysm. Electronically Signed   By: Rise Mu M.D.   On: 03/26/2020 00:46   MR BRAIN WO CONTRAST  Result Date: 03/26/2020 CLINICAL DATA:  Follow-up examination for acute stroke. Previously identified left M1 occlusion, status post tPA and catheter directed revascularization with stent placement. EXAM: MRI HEAD WITHOUT CONTRAST MRA HEAD WITHOUT CONTRAST TECHNIQUE: Multiplanar, multiecho pulse sequences of the brain and surrounding structures were obtained without intravenous contrast. Angiographic images of the head were obtained using MRA technique  without contrast. COMPARISON:  Prior head CT from earlier the same day as well as previous exams. FINDINGS: MRI HEAD FINDINGS Brain: Cerebral volume within normal limits for age. Minimal chronic microvascular ischemic disease noted within the supratentorial cerebral white matter. Large confluent area of restricted diffusion involving essentially the entirety of the left MCA distribution seen involving the left cerebral hemisphere. Associated gyral swelling and edema throughout the area of infarction. Associated regional mass effect with early left-to-right midline shift measuring up to 3 mm. Scattered T1 hyperintensity with extensive susceptibility artifact throughout the left sylvian fissure felt to be most consistent with subarachnoid hemorrhage. Additional scattered foci of subarachnoid blood seen scattered within the cortical sulci through the area of infarction, although some of this susceptibility signal likely reflects engorgement of the cortical veins. Small amount of subarachnoid blood also seen along the suprasellar cistern and likely within the interpeduncular cistern. No frank hemorrhagic transformation or intraparenchymal hemorrhage. No other areas of acute or subacute infarct. Gray-white matter differentiation otherwise maintained. No other areas of chronic cortical infarction. No mass lesion or extra-axial fluid collection. No hydrocephalus or ventricular trapping. Vascular: Vascular stent in place within the left M1 segment. Major intracranial vascular flow voids otherwise maintained. Skull and upper cervical spine: Craniocervical junction within normal limits. Bone marrow signal intensity normal. No scalp soft tissue abnormality. Sinuses/Orbits: Globes and orbital soft tissues within normal limits. Mild scattered mucosal thickening noted within the ethmoidal air cells. Paranasal sinuses are otherwise  clear. Fluid seen layering within the nasopharynx with trace bilateral mastoid effusions. Patient is  intubated. Other: None. MRA HEAD FINDINGS ANTERIOR CIRCULATION: Visualized distal cervical segments of the internal carotid arteries are patent with fairly symmetric antegrade flow. Petrous segments widely patent bilaterally. Cavernous and supraclinoid right ICA are widely patent. On the left, there is a focal 2-3 mm filling defect involving the proximal cavernous left ICA, suspicious for possible small amount of intraluminal thrombus (series 3, image 50). This is not seen on prior CTA. Associated moderate stenosis at this level. Cavernous and supraclinoid left ICA otherwise widely patent. ICA termini well perfused. A1 segments patent bilaterally. Normal anterior communicating artery. Anterior cerebral arteries are widely patent to their distal aspects. Right M1 widely patent. Normal right MCA bifurcation. Distal right MCA branches well perfused. On the left, vascular stent in place within the left M1 segment, extending across the left MCA bifurcation into proximal left M2 branch. No discernible flow seen through the stent. No flow related signal seen distally within the left MCA branches, suggesting reocclusion. POSTERIOR CIRCULATION: Partially visualized right vertebral artery remains patent. 2 mm focal outpouching extending posteriorly from the vertebrobasilar junction noted, possibly a small aneurysm versus vascular infundibulum, stable (series 3, image 23). Hypoplastic left vertebral artery terminates in PICA, not well seen on this exam. Partially visualized picas appear patent. Basilar remains patent to its distal aspect without stenosis. Superior cerebral arteries patent bilaterally. Both PCAs primarily supplied via the basilar. PCAs remain well perfused to their distal aspects without stenosis. IMPRESSION: MRI HEAD IMPRESSION: 1. Continued interval evolution of large left MCA territory infarct. Associated cytotoxic edema with regional mass effect and 3 mm of left-to-right shift. 2. Scattered susceptibility  artifact throughout the left sylvian fissure and left cerebral cortical sulci, most consistent with subarachnoid hemorrhage. Additional small volume subarachnoid blood also seen within the basilar cisterns. No frank intraparenchymal hemorrhage or hemorrhagic transformation. MRA HEAD IMPRESSION: 1. Vascular stent in place within the left M1 segment, with no discernible flow through the stent or distally within the left MCA distribution, consistent with reocclusion. 2. Focal 2-3 mm filling defect within the proximal cavernous left ICA as above, suspicious for a small amount of intraluminal thrombus. This is new from previous. 3. 2 mm focal outpouching extending posteriorly from the vertebrobasilar junction, which could reflect a small vascular infundibulum versus aneurysm. Electronically Signed   By: Rise Mu M.D.   On: 03/26/2020 00:46    PHYSICAL EXAM   Temp:  [98.2 F (36.8 C)-100.1 F (37.8 C)] 98.7 F (37.1 C) (04/22 0800) Pulse Rate:  [46-95] 67 (04/22 1000) Resp:  [12-18] 14 (04/22 1000) BP: (88-157)/(54-72) 152/66 (04/22 1000) SpO2:  [98 %-100 %] 100 % (04/22 1000) FiO2 (%):  [40 %] 40 % (04/22 0801)  General - Well nourished, well developed, slight SOB with snoring when asleep.  Ophthalmologic - fundi not visualized due to noncooperation.  Cardiovascular - Regular rate and rhythm.  Neuro - extubated this am, sleepy but arousable, global aphasia, not following commands, not verbal. Eyes left gaze position, not able to cross midline, not blinking to visual threat on the right, right facial droop.  Tongue protrusion not cooperative. Spontaneous movement of LUE and LLE, LUE at least 4/5, no drift when lifted in air. LLE 3/5 with pain stimulation. RUE flaccid, RLE mild withdraw to pain. DTR 1+ and no babinski. Sensation, coordination and gait not tested.   ASSESSMENT/PLAN [name] is a 59 y.o. male with history of HTN  and DB noncompliant w/ medications presenting to  [facility] with R sided weakness and inability to talk. NIH 28. CTA showed L M1 occlusion. Transferred to [facility]. [facility]. On arrival, received IV tPA 03/24/2020 at 0725 followed by mechanical thrombectomy w/ TICI2C revascularization and rescue stent placement.  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 Old L corona radiata and internal capsule infarcts. ASPECTS 10.     CTA head & neck large L MCA territory infarct. L M1 LVO. Collateral vessels in sylvian fissure. B ICA bifurcation atherosclerosis. Aortic atherosclerosis.   CT perfusion large L MCA penumbra.   Cerebral angio L MCA M1 occlusion w/ TICI2c revascularization x 6 passes. Rescue stenting for reocclusion   CT head 4/21 - evolving large left MCA infarct, trace midline shift, persistent high density and left C1 fissure, contrast versus blood  MRI  Large L MCA infarct evolution w/ edema and mass effect w/ 61mm L midline shift. SAH L sylvian fissure, L cerebral cortical sulci and basilar cisterns.  MRA  L M1 stent w/o flow c/w reocclusion. L ICA filling defect c/w small thrombus. VBJ w/ infundibulum vs aneurysm.  2D Echo EF 55-60%. No source of embolus   LE doppler neg DVT  May consider TEE and loop recorder if neuro improvement  LDL 55.1  TG 798->675->269  HgbA1c 9.2  SCDs for VTE prophylaxis  No antithrombotic prior to admission, now on aspirin 81 mg daily and Brilinta (ticagrelor) 90 mg bid given stent placement and L tICA thrombus.   Therapy recommendations:  SNF   Disposition:  pending   Cerebral edema  CT showed large left MCA infarct with minimal midline shift  MRI  Large L MCA infarct evolution w/ edema and mass effect w/ 45mm L midline shift.   Close monitoring  May need to consider  hypertonic saline or hemicrani  Acute Respiratory Failure  Agitation  Intubated for IR, left intubated post IR for airway protection secondary to acute L MCA stroke  Extubation 4/22  CCM on board  Snoring with sleeping  Close monitoring - high risk for mental status decline and reintubation  Hypertensive Emergency  SBP > 210 on arrival  Home meds:  Non-compliant - med rec lists:  losartan 25   BP variable during the night w/ agitation  Cleviprex changed to cardene d/t elevated TG -> now off  BP goal < 160 given SAH  Hydralazine PRN . Long-term BP goal normotensive  Hyperlipidemia  Home meds:  Non-compliant - med rec lists:  pravachol 10, crestor 5  LDL 55.1, goal < 70  TG 798->675->269   On lipitor 80  Continue statin at discharge  Diabetes type II Uncontrolled  Home meds:  Noncompliant - med rec lists:  dapagliflozin 10, glipizide 5, actos 30, dapagliflozin 10 / metformin 1000  HgbA1c 9.2, goal < 7.0  CBGs  SSI 0-15 q4  Dysphagia . Secondary to stroke . NPO . Speech on board . Put on TF    Other Stroke Risk Factors  Hx ETOH use, alcohol level <10, advised to drink no more than 2 drink(s) a day  Substance abuse - UDS:  Benzos POSITIVE  Obesity, Body mass index is 30.74 kg/m., recommend weight loss, diet and exercise as appropriate   Other Active  Problems  Thrombocytopenia PLT 120-135-118-116  Hypokalemia K 3.3-4.1-3.3-3.4-3.8 - supplement   Urinary retention - I&O cath, foley prn  Leukocytosis WBC 11.3->9.3->11.1  Hospital day # 2  This patient is critically ill due to large left MCA infarct, status post TPA and thrombectomy, uncontrolled diabetes, hypertension/hypotension and at significant risk of neurological worsening, death form recurrent stroke, hemorrhagic conversion, seizure, DKA, heart failure. This patient's care requires constant monitoring of vital signs, hemodynamics, respiratory and cardiac monitoring, review of multiple  databases, neurological assessment, discussion with family, other specialists and medical decision making of high complexity. I spent 40 minutes of neurocritical care time in the care of this patient. I discussed with Dr. Chase Caller.    Rosalin Hawking, MD PhD Stroke Neurology 03/26/2020 11:13 AM  To contact Stroke Continuity provider, please refer to [email or web address] After hours, contact General Neurology

## 2020-03-26 NOTE — Progress Notes (Signed)
NAME:  Brad Delacruz, MRN:  [id number], DOB:  1961/07/12, LOS: 2 ADMISSION DATE:  03/24/2020, CONSULTATION DATE:  03/24/20 REFERRING MD:  Corliss Skains, CHIEF COMPLAINT:  Vent management   Brief History   58yo male with hx DM, HTN presented 4/20 to [facility] with sudden onset R sided weakness and aphasia.  SBP >200.  CT revealed dense L MCA occlusion.  He was tx urgently to [facility], IV tPA given and underwent IR revascularization of occluded L MCA.  Pt left intubated post procedure and PCCM consulted for vent and ICU management.    Past Medical History  DM  Significant Hospital Events   4/20: Admitted  4/20: S/p tPa and intracranial thrombectomy   Consults:  PCCM  Procedures:  4/20: Left MCA revascularization  4.21 - Difficulties with BP control overnight. Agitated. Started on versed 10 mg/hr.    Significant Diagnostic Tests:  CTA head 4/20>>>  1. Large left MCA territory nonhemorrhagic infarct. 2. Acute left M1 large vessel occlusion. 3. Large penumbra with mismatch volume of 99 mL. 4. Collateral vessels are present within the sylvian fissure.  Micro Data:  4/20: SARS COVID negative   Antimicrobials:  None   Interim history/subjective:    4/22 - off sedation gtt. DOing PSV. PT/OT having him sit on side of bed. He is a bit drowsy but followed simple command like nodding to how are you and taking a deep breath. GAg +  Objective   Blood pressure (!) 154/62, pulse (!) 57, temperature 98.7 F (37.1 C), temperature source Axillary, resp. rate 12, height  (1.651 m), weight 83.8 kg, SpO2 100 %.    Vent Mode: CPAP;PSV FiO2 (%):  [40 %] 40 % Set Rate:  [18 bmp] 18 bmp Vt Set:  [490 mL] 490 mL PEEP:  [5 cmH20] 5 cmH20 Pressure Support:  [10 cmH20] 10 cmH20 Plateau Pressure:  [12 cmH20-16 cmH20] 12 cmH20   Intake/Output Summary (Last 24 hours) at 03/26/2020 0919 Last data filed at 03/26/2020 0800 Gross per 24 hour  Intake 1198.76 ml  Output 2200 ml  Net -1001.24 ml    Filed Weights   03/24/20 0607 03/25/20 0323  Weight: 85.9 kg 83.8 kg   Examination: General Appearance:  Sitting on side of bed. ET tube +. PT / OT supporting him Head:  Normocephalic, without obvious abnormality, atraumatic Eyes:  PERRL - yes, conjunctiva/corneas - muddy     Ears:  Normal external ear canals, both ears Nose:  G tube - no Throat:  ETT TUBE - yes , OG tube - yes Neck:  Supple,  No enlargement/tenderness/nodules Lungs: Clear to auscultation bilaterally, Ventilator   Synchrony - yes Heart:  S1 and S2 normal, no murmur, CVP - x.  Pressors - no Abdomen:  Soft, no masses, no organomegaly Genitalia / Rectal:  Not done Extremities:  Extremities- intact Skin:  ntact in exposed areas . Sacral area - not examined Neurologic:  Sedation - none -> RASS - +1 . Moves all 4s - Right side dense hemiplegia. CAM-ICU - x . 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 Mild sleepiness. Follows some commands  - Per stroke team    Acute respiratory failure - post neuro IR in setting L MCA stroke    03/26/2020 - > does yes likely meet  criteria for SBT/Extubation in setting of Acute Respiratory Failure due to stroke  Plan  - dc all sedation from [facility] - aim to extubate if ok with neuro   Hx HTN - cleviprex gtt required on arrival - SBP goal between 120-140  - off gtt 03/26/2020   Hx Type 2 DM  - CBG goal < 180  - Improving with SSI, continue to monitor   Best practice:  Diet: NPO Pain/Anxiety/Delirium protocol (if indicated): PAD protocol - prn VAP protocol (if indicated): ordered  DVT prophylaxis: SCD's GI prophylaxis: pepcid Glucose control: SSI Mobility: BR Code Status: full Family Communication: Per primary Disposition: icu 4N     ATTESTATION & SIGNATURE   The patient Brad Delacruz is critically ill  with multiple organ systems failure and requires high complexity decision making for assessment and support, frequent evaluation and titration of therapies, application of advanced monitoring technologies and extensive interpretation of multiple databases.   Critical Care Time devoted to patient care services described in this note is  31  Minutes. This time reflects time of care of this signee Dr Kalman Shan. This critical care time does not reflect procedure time, or teaching time or supervisory time of PA/NP/Med student/Med Resident etc but could involve care discussion time     Dr. Kalman Shan, M.D., [facility].C.P Pulmonary and Critical Care Medicine Staff Physician [facility] Pager: [phone], If no answer or between  15:00h - 7:00h: call [phone]  03/26/2020 9:20 AM     LABS    PULMONARY Recent Labs  Lab 03/24/20 0613 03/24/20 1209  PHART  --  7.361  PCO2ART  --  39.5  PO2ART  --  257.0*  HCO3  --  22.4  TCO2 27 24  O2SAT  --  100.0    CBC Recent Labs  Lab 03/25/20 0307 03/25/20 1000 03/26/20 0556  HGB 13.0 12.9* 13.5  HCT 36.3* 36.2* 38.4*  WBC 11.3* 9.3 11.1*  PLT 135* 118* 116*    COAGULATION Recent Labs  Lab 03/24/20 0601  INR 1.0    CARDIAC  No results for input(s): TROPONINI in the last 168 hours. No results for input(s): PROBNP in the last 168 hours.   CHEMISTRY Recent Labs  Lab 03/24/20 0601 03/24/20 0601 03/24/20 8295 03/24/20 0613 03/24/20 1209 03/24/20 1209 03/25/20 0307 03/25/20 0307 03/25/20 1000 03/26/20 0556  NA 130*   < > 133*  --  136  --  138  --  137 143  K 3.3*   < > 3.3*   < > 4.1   < > 3.3*   < > 3.4* 3.8  CL 94*  --  96*  --   --   --  107  --  106 110  CO2 26  --   --   --   --   --  22  --  21* 22  GLUCOSE 309*  --  293*  --   --   --  186*  --  179* 126*  BUN 20  --  20  --   --   --  12  --  11 16  CREATININE 0.94  --  0.90  --   --   --  0.91  --   0.90 0.96  CALCIUM 8.9  --   --   --   --   --  8.6*  --  8.5* 9.2  MG  --   --   --   --   --   --   --   --  2.1  --   PHOS  --   --   --   --   --   --   --   --  2.4*  --    < > = values in this interval not displayed.   Estimated Creatinine Clearance: 82.5 mL/min (by C-G formula based on SCr of 0.96 mg/dL).   LIVER Recent Labs  Lab 03/24/20 0601 03/25/20 1000  AST 21 23  ALT 27 22  ALKPHOS 90 50  BILITOT 0.7 1.0  PROT 6.5 5.9*  ALBUMIN 3.8 3.2*  INR 1.0  --      INFECTIOUS No results for input(s): LATICACIDVEN, PROCALCITON in the last 168 hours.   ENDOCRINE CBG (last 3)  Recent Labs    03/25/20 2323 03/26/20 0321 03/26/20 0754  GLUCAP 101* 114* 112*         IMAGING x48h  - image(s) personally visualized  -   highlighted in bold CT HEAD WO CONTRAST  Result Date: 03/25/2020 CLINICAL DATA:  59 year old male status post code stroke presentation yesterday with right side weakness, left MCA M1 ELV0. Status post endovascular revascularization including rescue stenting. Postprocedure subarachnoid blood versus contrast. EXAM: CT HEAD WITHOUT CONTRAST TECHNIQUE: Contiguous axial images were obtained from the base of the skull through the vertex without intravenous contrast. COMPARISON:  Postprocedure head CT 03/24/2020 and earlier. FINDINGS: Brain: Cytotoxic edema in the left hemisphere resembling the T-max greater than 6 volume on CTP yesterday (series 3, image 16). Expected evolution since 1830 hours yesterday. But only mild intracranial mass effect at this time with trace rightward midline shift. Persistent high density in the left subarachnoid space around the sylvian fissure more resembles contrast than hemorrhage. And configuration is unchanged since yesterday. No intraventricular hemorrhage. No ventriculomegaly. Gray-white matter differentiation remains normal in the right hemisphere and posterior fossa. Basilar cisterns are patent. Vascular: Left MCA M1 through  bifurcation stent redemonstrated. Skull: No acute osseous abnormality identified. Sinuses/Orbits: Visualized paranasal sinuses and mastoids are stable and well pneumatized. Other: Fluid in the nasopharynx, intubated on the scalp view. Visualized orbits and scalp soft tissues are within normal limits. IMPRESSION: 1. Evolving large left MCA infarct with expected appearance compared to 1830 hours yesterday. 2. No definite hemorrhagic conversion, and only mild intracranial mass effect at this time - trace midline shift. 3. Persistent high density in the subarachnoid spaces in and around the left Sylvian fissure more resembles contrast than blood. 4. No new intracranial abnormality. Electronically Signed   By: Odessa Fleming M.D.   On: 03/25/2020 14:41   CT HEAD WO CONTRAST  Result Date: 03/24/2020 CLINICAL DATA:  Follow-up acute left cerebral hemisphere infarct with intervention. EXAM: CT HEAD WITHOUT CONTRAST TECHNIQUE: Contiguous axial images were obtained from the base of the skull through the vertex without intravenous contrast. COMPARISON:  Earlier today. FINDINGS: Brain: Again demonstrated is ill-defined low density and subarachnoid hemorrhage in the left middle cerebral and left posterior cerebral artery distributions. A small, old curvilinear infarct along the lateral margin of the left caudate head is again noted. Normal size and position of the ventricles. No shift of midline structures. Vascular: Left middle cerebral artery stent. Skull: Normal. Negative for fracture or focal lesion. Sinuses/Orbits: Unremarkable. Other: None. IMPRESSION: 1. Acute left middle cerebral and left posterior cerebral artery territory infarct without mass effect. 2. Stable subarachnoid hemorrhage on the left. 3. Stable left middle cerebral artery stent. 4. Stable small, old curvilinear infarct along the lateral margin of the left caudate head. Electronically  Signed   By: Beckie Salts M.D.   On: 03/24/2020 18:45   MR ANGIO HEAD WO  CONTRAST  Result Date: 03/26/2020 CLINICAL DATA:  Follow-up examination for acute stroke. Previously identified left M1 occlusion, status post tPA and catheter directed revascularization with stent placement. EXAM: MRI HEAD WITHOUT CONTRAST MRA HEAD WITHOUT CONTRAST TECHNIQUE: Multiplanar, multiecho pulse sequences of the brain and surrounding structures were obtained without intravenous contrast. Angiographic images of the head were obtained using MRA technique without contrast. COMPARISON:  Prior head CT from earlier the same day as well as previous exams. FINDINGS: MRI HEAD FINDINGS Brain: Cerebral volume within normal limits for age. Minimal chronic microvascular ischemic disease noted within the supratentorial cerebral white matter. Large confluent area of restricted diffusion involving essentially the entirety of the left MCA distribution seen involving the left cerebral hemisphere. Associated gyral swelling and edema throughout the area of infarction. Associated regional mass effect with early left-to-right midline shift measuring up to 3 mm. Scattered T1 hyperintensity with extensive susceptibility artifact throughout the left sylvian fissure felt to be most consistent with subarachnoid hemorrhage. Additional scattered foci of subarachnoid blood seen scattered within the cortical sulci through the area of infarction, although some of this susceptibility signal likely reflects engorgement of the cortical veins. Small amount of subarachnoid blood also seen along the suprasellar cistern and likely within the interpeduncular cistern. No frank hemorrhagic transformation or intraparenchymal hemorrhage. No other areas of acute or subacute infarct. Gray-white matter differentiation otherwise maintained. No other areas of chronic cortical infarction. No mass lesion or extra-axial fluid collection. No hydrocephalus or ventricular trapping. Vascular: Vascular stent in place within the left M1 segment. Major  intracranial vascular flow voids otherwise maintained. Skull and upper cervical spine: Craniocervical junction within normal limits. Bone marrow signal intensity normal. No scalp soft tissue abnormality. Sinuses/Orbits: Globes and orbital soft tissues within normal limits. Mild scattered mucosal thickening noted within the ethmoidal air cells. Paranasal sinuses are otherwise clear. Fluid seen layering within the nasopharynx with trace bilateral mastoid effusions. Patient is intubated. Other: None. MRA HEAD FINDINGS ANTERIOR CIRCULATION: Visualized distal cervical segments of the internal carotid arteries are patent with fairly symmetric antegrade flow. Petrous segments widely patent bilaterally. Cavernous and supraclinoid right ICA are widely patent. On the left, there is a focal 2-3 mm filling defect involving the proximal cavernous left ICA, suspicious for possible small amount of intraluminal thrombus (series 3, image 50). This is not seen on prior CTA. Associated moderate stenosis at this level. Cavernous and supraclinoid left ICA otherwise widely patent. ICA termini well perfused. A1 segments patent bilaterally. Normal anterior communicating artery. Anterior cerebral arteries are widely patent to their distal aspects. Right M1 widely patent. Normal right MCA bifurcation. Distal right MCA branches well perfused. On the left, vascular stent in place within the left M1 segment, extending across the left MCA bifurcation into proximal left M2 branch. No discernible flow seen through the stent. No flow related signal seen distally within the left MCA branches, suggesting reocclusion. POSTERIOR CIRCULATION: Partially visualized right vertebral artery remains patent. 2 mm focal outpouching extending posteriorly from the vertebrobasilar junction noted, possibly a small aneurysm versus vascular infundibulum, stable (series 3, image 23). Hypoplastic left vertebral artery terminates in PICA, not well seen on this exam.  Partially visualized picas appear patent. Basilar remains patent to its distal aspect without stenosis. Superior cerebral arteries patent bilaterally. Both PCAs primarily supplied via the basilar. PCAs remain well perfused to their distal aspects without stenosis. IMPRESSION: MRI  HEAD IMPRESSION: 1. Continued interval evolution of large left MCA territory infarct. Associated cytotoxic edema with regional mass effect and 3 mm of left-to-right shift. 2. Scattered susceptibility artifact throughout the left sylvian fissure and left cerebral cortical sulci, most consistent with subarachnoid hemorrhage. Additional small volume subarachnoid blood also seen within the basilar cisterns. No frank intraparenchymal hemorrhage or hemorrhagic transformation. MRA HEAD IMPRESSION: 1. Vascular stent in place within the left M1 segment, with no discernible flow through the stent or distally within the left MCA distribution, consistent with reocclusion. 2. Focal 2-3 mm filling defect within the proximal cavernous left ICA as above, suspicious for a small amount of intraluminal thrombus. This is new from previous. 3. 2 mm focal outpouching extending posteriorly from the vertebrobasilar junction, which could reflect a small vascular infundibulum versus aneurysm. Electronically Signed   By: Rise Mu M.D.   On: 03/26/2020 00:46   MR BRAIN WO CONTRAST  Result Date: 03/26/2020 CLINICAL DATA:  Follow-up examination for acute stroke. Previously identified left M1 occlusion, status post tPA and catheter directed revascularization with stent placement. EXAM: MRI HEAD WITHOUT CONTRAST MRA HEAD WITHOUT CONTRAST TECHNIQUE: Multiplanar, multiecho pulse sequences of the brain and surrounding structures were obtained without intravenous contrast. Angiographic images of the head were obtained using MRA technique without contrast. COMPARISON:  Prior head CT from earlier the same day as well as previous exams. FINDINGS: MRI HEAD  FINDINGS Brain: Cerebral volume within normal limits for age. Minimal chronic microvascular ischemic disease noted within the supratentorial cerebral white matter. Large confluent area of restricted diffusion involving essentially the entirety of the left MCA distribution seen involving the left cerebral hemisphere. Associated gyral swelling and edema throughout the area of infarction. Associated regional mass effect with early left-to-right midline shift measuring up to 3 mm. Scattered T1 hyperintensity with extensive susceptibility artifact throughout the left sylvian fissure felt to be most consistent with subarachnoid hemorrhage. Additional scattered foci of subarachnoid blood seen scattered within the cortical sulci through the area of infarction, although some of this susceptibility signal likely reflects engorgement of the cortical veins. Small amount of subarachnoid blood also seen along the suprasellar cistern and likely within the interpeduncular cistern. No frank hemorrhagic transformation or intraparenchymal hemorrhage. No other areas of acute or subacute infarct. Gray-white matter differentiation otherwise maintained. No other areas of chronic cortical infarction. No mass lesion or extra-axial fluid collection. No hydrocephalus or ventricular trapping. Vascular: Vascular stent in place within the left M1 segment. Major intracranial vascular flow voids otherwise maintained. Skull and upper cervical spine: Craniocervical junction within normal limits. Bone marrow signal intensity normal. No scalp soft tissue abnormality. Sinuses/Orbits: Globes and orbital soft tissues within normal limits. Mild scattered mucosal thickening noted within the ethmoidal air cells. Paranasal sinuses are otherwise clear. Fluid seen layering within the nasopharynx with trace bilateral mastoid effusions. Patient is intubated. Other: None. MRA HEAD FINDINGS ANTERIOR CIRCULATION: Visualized distal cervical segments of the internal  carotid arteries are patent with fairly symmetric antegrade flow. Petrous segments widely patent bilaterally. Cavernous and supraclinoid right ICA are widely patent. On the left, there is a focal 2-3 mm filling defect involving the proximal cavernous left ICA, suspicious for possible small amount of intraluminal thrombus (series 3, image 50). This is not seen on prior CTA. Associated moderate stenosis at this level. Cavernous and supraclinoid left ICA otherwise widely patent. ICA termini well perfused. A1 segments patent bilaterally. Normal anterior communicating artery. Anterior cerebral arteries are widely patent to their distal aspects. Right M1  widely patent. Normal right MCA bifurcation. Distal right MCA branches well perfused. On the left, vascular stent in place within the left M1 segment, extending across the left MCA bifurcation into proximal left M2 branch. No discernible flow seen through the stent. No flow related signal seen distally within the left MCA branches, suggesting reocclusion. POSTERIOR CIRCULATION: Partially visualized right vertebral artery remains patent. 2 mm focal outpouching extending posteriorly from the vertebrobasilar junction noted, possibly a small aneurysm versus vascular infundibulum, stable (series 3, image 23). Hypoplastic left vertebral artery terminates in PICA, not well seen on this exam. Partially visualized picas appear patent. Basilar remains patent to its distal aspect without stenosis. Superior cerebral arteries patent bilaterally. Both PCAs primarily supplied via the basilar. PCAs remain well perfused to their distal aspects without stenosis. IMPRESSION: MRI HEAD IMPRESSION: 1. Continued interval evolution of large left MCA territory infarct. Associated cytotoxic edema with regional mass effect and 3 mm of left-to-right shift. 2. Scattered susceptibility artifact throughout the left sylvian fissure and left cerebral cortical sulci, most consistent with subarachnoid  hemorrhage. Additional small volume subarachnoid blood also seen within the basilar cisterns. No frank intraparenchymal hemorrhage or hemorrhagic transformation. MRA HEAD IMPRESSION: 1. Vascular stent in place within the left M1 segment, with no discernible flow through the stent or distally within the left MCA distribution, consistent with reocclusion. 2. Focal 2-3 mm filling defect within the proximal cavernous left ICA as above, suspicious for a small amount of intraluminal thrombus. This is new from previous. 3. 2 mm focal outpouching extending posteriorly from the vertebrobasilar junction, which could reflect a small vascular infundibulum versus aneurysm. Electronically Signed   By: Jeannine Boga M.D.   On: 03/26/2020 00:46   Portable Chest xray  Result Date: 03/25/2020 CLINICAL DATA:  Respiratory failure. Left MCA infarct. EXAM: PORTABLE CHEST 1 VIEW COMPARISON:  One-view chest x-ray 06/13/2008 FINDINGS: Heart size is normal. Lung volumes are low. The patient is intubated. Endotracheal tube terminates 4 cm above the carina. NG tube courses off the inferior border the film. IMPRESSION: 1. Endotracheal tube terminates 4 cm above the carina. 2. Low lung volumes. Electronically Signed   By: San Morelle M.D.   On: 03/25/2020 06:05   IR PATIENT EVAL TECH 0-60 MINS  Result Date: 03/25/2020 Karenann Cai     03/25/2020 10:42 AM Left 4 french sheath removal:  No hematoma and pulses present prior to sheath removal.  Sheath removed at 1003 and held manual pressure for 15 minutes. No complications, pulses present and groin site level 0.  Verified groin site with [facility]. [place] Ravens RT VI, R  ECHOCARDIOGRAM COMPLETE  Result Date: 03/24/2020    ECHOCARDIOGRAM REPORT   Patient Name:   YAHIA BOTTGER Date of Exam: 03/24/2020 Medical Rec #:  [id number]        Height:       65.0 in Accession #:    [id number]       Weight:       189.4 lb Date of Birth:  [birth year]       BSA:          1.933 m Patient  Age:    89 years         BP:           119/53 mmHg Patient Gender: M                HR:           56 bpm. Exam Location:  Inpatient Procedure: 2D Echo, Cardiac Doppler and Color Doppler Indications:    Stroke 434.91 / I163.9  History:        Patient has no prior history of Echocardiogram examinations.                 Risk Factors:Diabetes.  Sonographer:    Elmarie Shiley Dance Referring Phys: [id number] ASHISH ARORA IMPRESSIONS  1. Normal LV systolic function; grade 1 diastolic dysfunction.  2. Left ventricular ejection fraction, by estimation, is 55 to 60%. The left ventricle has normal function. The left ventricle has no regional wall motion abnormalities. Left ventricular diastolic parameters are consistent with Grade I diastolic dysfunction (impaired relaxation).  3. Right ventricular systolic function is normal. The right ventricular size is normal.  4. The mitral valve is normal in structure. No evidence of mitral valve regurgitation. No evidence of mitral stenosis.  5. The aortic valve is tricuspid. Aortic valve regurgitation is not visualized. Mild aortic valve sclerosis is present, with no evidence of aortic valve stenosis. FINDINGS  Left Ventricle: Left ventricular ejection fraction, by estimation, is 55 to 60%. The left ventricle has normal function. The left ventricle has no regional wall motion abnormalities. The left ventricular internal cavity size was normal in size. There is  no left ventricular hypertrophy. Left ventricular diastolic parameters are consistent with Grade I diastolic dysfunction (impaired relaxation). Right Ventricle: The right ventricular size is normal. Right ventricular systolic function is normal. Left Atrium: Left atrial size was normal in size. Right Atrium: Right atrial size was normal in size. Pericardium: There is no evidence of pericardial effusion. Mitral Valve: The mitral valve is normal in structure. Normal mobility of the mitral valve leaflets. No evidence of mitral valve  regurgitation. No evidence of mitral valve stenosis. Tricuspid Valve: The tricuspid valve is normal in structure. Tricuspid valve regurgitation is not demonstrated. No evidence of tricuspid stenosis. Aortic Valve: The aortic valve is tricuspid. Aortic valve regurgitation is not visualized. Mild aortic valve sclerosis is present, with no evidence of aortic valve stenosis. Pulmonic Valve: The pulmonic valve was normal in structure. Pulmonic valve regurgitation is not visualized. No evidence of pulmonic stenosis. Aorta: The aortic root is normal in size and structure. Venous: The inferior vena cava was not well visualized.  Additional Comments: Normal LV systolic function; grade 1 diastolic dysfunction.  LEFT VENTRICLE PLAX 2D LVIDd:         3.09 cm  Diastology LVIDs:         2.86 cm  LV e' lateral:   4.57 cm/s LV PW:         0.99 cm  LV E/e' lateral: 9.2 LV IVS:        0.94 cm  LV e' medial:    4.90 cm/s LVOT diam:     2.10 cm  LV E/e' medial:  8.6 LV SV:         62 LV SV Index:   32 LVOT Area:     3.46 cm  RIGHT VENTRICLE             IVC RV Basal diam:  2.12 cm     IVC diam: 2.18 cm RV S prime:     10.20 cm/s TAPSE (M-mode): 2.1 cm LEFT ATRIUM             Index       RIGHT ATRIUM           Index LA diam:        3.10 cm  1.60 cm/m  RA Area:     10.60 cm LA Vol (A2C):   35.2 ml 18.21 ml/m RA Volume:   17.40 ml  9.00 ml/m LA Vol (A4C):   35.0 ml 18.11 ml/m LA Biplane Vol: 35.2 ml 18.21 ml/m  AORTIC VALVE LVOT Vmax:   79.10 cm/s LVOT Vmean:  47.100 cm/s LVOT VTI:    0.180 m  AORTA Ao Root diam: 3.50 cm Ao Asc diam:  3.45 cm MITRAL VALVE MV Area (PHT): 1.78 cm    SHUNTS MV Decel Time: 426 msec    Systemic VTI:  0.18 m MV E velocity: 42.20 cm/s  Systemic Diam: 2.10 cm MV A velocity: 45.60 cm/s MV E/A ratio:  0.93 Olga MillersBrian Crenshaw MD Electronically signed by Olga MillersBrian Crenshaw MD Signature Date/Time: 03/24/2020/2:36:05 PM    Final

## 2020-03-26 NOTE — Progress Notes (Signed)
Lower venous duplex       has been completed. Preliminary results can be found under CV proc through chart review. Ki Luckman, BS, RDMS, RVT   

## 2020-03-27 ENCOUNTER — Inpatient Hospital Stay (HOSPITAL_COMMUNITY): Payer: BC Managed Care – PPO

## 2020-03-27 DIAGNOSIS — J9601 Acute respiratory failure with hypoxia: Secondary | ICD-10-CM | POA: Diagnosis not present

## 2020-03-27 DIAGNOSIS — E876 Hypokalemia: Secondary | ICD-10-CM | POA: Diagnosis not present

## 2020-03-27 DIAGNOSIS — J690 Pneumonitis due to inhalation of food and vomit: Secondary | ICD-10-CM | POA: Diagnosis not present

## 2020-03-27 DIAGNOSIS — E870 Hyperosmolality and hypernatremia: Secondary | ICD-10-CM | POA: Diagnosis not present

## 2020-03-27 DIAGNOSIS — E782 Mixed hyperlipidemia: Secondary | ICD-10-CM

## 2020-03-27 DIAGNOSIS — I10 Essential (primary) hypertension: Secondary | ICD-10-CM | POA: Diagnosis not present

## 2020-03-27 DIAGNOSIS — I6602 Occlusion and stenosis of left middle cerebral artery: Secondary | ICD-10-CM | POA: Diagnosis not present

## 2020-03-27 DIAGNOSIS — J95821 Acute postprocedural respiratory failure: Secondary | ICD-10-CM | POA: Diagnosis not present

## 2020-03-27 DIAGNOSIS — I63512 Cerebral infarction due to unspecified occlusion or stenosis of left middle cerebral artery: Secondary | ICD-10-CM | POA: Diagnosis not present

## 2020-03-27 DIAGNOSIS — I63412 Cerebral infarction due to embolism of left middle cerebral artery: Secondary | ICD-10-CM | POA: Diagnosis not present

## 2020-03-27 DIAGNOSIS — I639 Cerebral infarction, unspecified: Secondary | ICD-10-CM | POA: Diagnosis not present

## 2020-03-27 DIAGNOSIS — J9600 Acute respiratory failure, unspecified whether with hypoxia or hypercapnia: Secondary | ICD-10-CM | POA: Diagnosis not present

## 2020-03-27 DIAGNOSIS — E1165 Type 2 diabetes mellitus with hyperglycemia: Secondary | ICD-10-CM | POA: Diagnosis not present

## 2020-03-27 DIAGNOSIS — I609 Nontraumatic subarachnoid hemorrhage, unspecified: Secondary | ICD-10-CM | POA: Diagnosis not present

## 2020-03-27 LAB — CBC WITH DIFFERENTIAL/PLATELET
Abs Immature Granulocytes: 0.06 10*3/uL (ref 0.00–0.07)
Basophils Absolute: 0.1 10*3/uL (ref 0.0–0.1)
Basophils Relative: 0 %
Eosinophils Absolute: 0 10*3/uL (ref 0.0–0.5)
Eosinophils Relative: 0 %
HCT: 41.9 % (ref 39.0–52.0)
Hemoglobin: 14.6 g/dL (ref 13.0–17.0)
Immature Granulocytes: 1 %
Lymphocytes Relative: 8 %
Lymphs Abs: 1 10*3/uL (ref 0.7–4.0)
MCH: 31.3 pg (ref 26.0–34.0)
MCHC: 34.8 g/dL (ref 30.0–36.0)
MCV: 89.9 fL (ref 80.0–100.0)
Monocytes Absolute: 1.2 10*3/uL — ABNORMAL HIGH (ref 0.1–1.0)
Monocytes Relative: 9 %
Neutro Abs: 10.9 10*3/uL — ABNORMAL HIGH (ref 1.7–7.7)
Neutrophils Relative %: 82 %
Platelets: 158 10*3/uL (ref 150–400)
RBC: 4.66 MIL/uL (ref 4.22–5.81)
RDW: 13.2 % (ref 11.5–15.5)
WBC: 13.3 10*3/uL — ABNORMAL HIGH (ref 4.0–10.5)
nRBC: 0 % (ref 0.0–0.2)

## 2020-03-27 LAB — COMPREHENSIVE METABOLIC PANEL
ALT: 22 U/L (ref 0–44)
AST: 19 U/L (ref 15–41)
Albumin: 3.3 g/dL — ABNORMAL LOW (ref 3.5–5.0)
Alkaline Phosphatase: 54 U/L (ref 38–126)
Anion gap: 11 (ref 5–15)
BUN: 20 mg/dL (ref 6–20)
CO2: 23 mmol/L (ref 22–32)
Calcium: 9.4 mg/dL (ref 8.9–10.3)
Chloride: 109 mmol/L (ref 98–111)
Creatinine, Ser: 0.8 mg/dL (ref 0.61–1.24)
GFR calc Af Amer: >60 mL/min (ref >60)
GFR calc non Af Amer: >60 mL/min (ref >60)
Glucose, Bld: 144 mg/dL — ABNORMAL HIGH (ref 70–99)
Potassium: 2.9 mmol/L — ABNORMAL LOW (ref 3.5–5.1)
Sodium: 143 mmol/L (ref 135–145)
Total Bilirubin: 1.5 mg/dL — ABNORMAL HIGH (ref 0.3–1.2)
Total Protein: 6.7 g/dL (ref 6.5–8.1)

## 2020-03-27 LAB — GLUCOSE, CAPILLARY
Glucose-Capillary: 122 mg/dL — ABNORMAL HIGH (ref 70–99)
Glucose-Capillary: 144 mg/dL — ABNORMAL HIGH (ref 70–99)
Glucose-Capillary: 159 mg/dL — ABNORMAL HIGH (ref 70–99)
Glucose-Capillary: 172 mg/dL — ABNORMAL HIGH (ref 70–99)
Glucose-Capillary: 177 mg/dL — ABNORMAL HIGH (ref 70–99)
Glucose-Capillary: 187 mg/dL — ABNORMAL HIGH (ref 70–99)

## 2020-03-27 LAB — PHOSPHORUS: Phosphorus: 2.5 mg/dL (ref 2.5–4.6)

## 2020-03-27 LAB — SODIUM
Sodium: 143 mmol/L (ref 135–145)
Sodium: 148 mmol/L — ABNORMAL HIGH (ref 135–145)
Sodium: 145 mmol/L (ref 135–145)

## 2020-03-27 LAB — MAGNESIUM: Magnesium: 2.3 mg/dL (ref 1.7–2.4)

## 2020-03-27 MED ORDER — POTASSIUM CHLORIDE 20 MEQ/15ML (10%) PO SOLN
40.0000 meq | ORAL | Status: AC
  Administered 2020-03-27 (×3): 40 meq
  Filled 2020-03-27 (×3): qty 30

## 2020-03-27 MED ORDER — SODIUM CHLORIDE 3 % IV SOLN
INTRAVENOUS | Status: DC
  Administered 2020-03-27: 50 mL/h via INTRAVENOUS
  Filled 2020-03-27 (×3): qty 500

## 2020-03-27 MED ORDER — VITAL AF 1.2 CAL PO LIQD
1000.0000 mL | ORAL | Status: DC
  Administered 2020-03-28 – 2020-03-29 (×2): 1000 mL

## 2020-03-27 MED ORDER — ASPIRIN 325 MG PO TABS
325.0000 mg | ORAL_TABLET | Freq: Every day | ORAL | Status: DC
  Filled 2020-03-27: qty 1

## 2020-03-27 MED ORDER — SODIUM CHLORIDE 3 % IV SOLN
INTRAVENOUS | Status: DC
  Administered 2020-03-27 – 2020-03-28 (×2): 75 mL/h via INTRAVENOUS
  Filled 2020-03-27 (×6): qty 500

## 2020-03-27 MED ORDER — VITAL AF 1.2 CAL PO LIQD
1000.0000 mL | ORAL | Status: DC
  Administered 2020-03-27: 1000 mL

## 2020-03-27 MED ORDER — BUTALBITAL-APAP-CAFFEINE 50-325-40 MG PO TABS: 1.0000 | ORAL_TABLET | Freq: Three times a day (TID) | ORAL | Status: DC | PRN

## 2020-03-27 NOTE — Progress Notes (Signed)
Irregular heart rhythm and pulse on telemetry, fluctuating quickly between 50s and 100s. EKG obtained, reading marked sinus arrhythmias but otherwise normal. Will continue to monitor.

## 2020-03-27 NOTE — Progress Notes (Signed)
NAME:  Brad Delacruz, MRN:  [id number], DOB:  12-Mar-1961, LOS: 3 ADMISSION DATE:  03/24/2020, CONSULTATION DATE:  03/24/20 REFERRING MD:  Estanislado Pandy, CHIEF COMPLAINT:  Vent management   Brief History   59yo male with hx DM, HTN presented 4/20 to [facility] with sudden onset R sided weakness and aphasia.  SBP >200.  CT revealed dense L MCA occlusion.  He was tx urgently to [facility], IV tPA given and underwent IR revascularization of occluded L MCA.  Pt left intubated post procedure and PCCM consulted for vent and ICU management.    Past Medical History  DM  Significant Hospital Events   4/20: Admitted  4/20: S/p tPa and intracranial thrombectomy - rescue stenting of left MCA M1 segment occlusion achieving a TICI 2C revascularization 03/24/2020 by Dr. Estanislado Pandy.  Consults:  PCCM  Procedures:  4/20: Left MCA revascularization  4.21 - Difficulties with BP control overnight. Agitated. Started on versed 10 mg/hr.  4/22 - 4/22 - off sedation gtt. DOing PSV. PT/OT having him sit on side of bed. He is a bit drowsy but followed simple command like nodding to how are you and taking a deep breath. GAg +   Significant Diagnostic Tests:  CTA head 4/20>>>  1. Large left MCA territory nonhemorrhagic infarct. 2. Acute left M1 large vessel occlusion. 3. Large penumbra with mismatch volume of 99 mL. 4. Collateral vessels are present within the sylvian fissure.  CT head 4/23  IMPRESSION: 1. Continued evolution of large Left MCA infarct with increased intracranial mass effect since 03/26/2019. Rightward midline shift now 5-6 mm. Basilar cisterns remain patent. 2. Stable left hemisphere subarachnoid hemorrhage and/or contrast, including trace blood layering along the midline tentorium. 3. No new intracranial abnormality.  Electronically Signed: By: Genevie Ann M.D. On: 03/27/2020 10:31  Micro Data:  4/20: SARS COVID negative   Antimicrobials:  None   Interim history/subjective:   4/23 - >  off vent x 24h. IR wants him on brilinta and aspirin with repeat CTA head and neck in 3 months. Duplex LE negative for DVT . Worsening midline shift to 5-30mm and 3% saline started by neuro via PIV. RN feel mental status overall similar   Objective   Blood pressure (!) 154/74, pulse 83, temperature 98.8 F (37.1 C), temperature source Axillary, resp. rate 18, height 5\' 5"  (1.651 m), weight 80.3 kg, SpO2 96 %.    FiO2 (%):  [40 %] 40 %   Intake/Output Summary (Last 24 hours) at 03/27/2020 1222 Last data filed at 03/27/2020 1200 Gross per 24 hour  Intake 1001.06 ml  Output 4675 ml  Net -3673.94 ml   Filed Weights   03/24/20 0607 03/25/20 0323 03/27/20 0500  Weight: 85.9 kg 83.8 kg 80.3 kg   Examintion General Appearance:  overweight Head:  Normocephalic, without obvious abnormality, atraumatic Eyes:  PERRL - yes, conjunctiva/corneas - clear     Ears:  Normal external ear canals, both ears Nose:  G tube - no Throat:  ETT TUBE - no , OG tube - no Neck:  Supple,  No enlargement/tenderness/nodules Lungs: Clear to auscultation bilaterall Heart:  S1 and S2 normal, no murmur, CVP - no.  Pressors - no Abdomen:  Soft, no masses, no organomegaly Genitalia / Rectal:  Not done Extremities:  Extremities- intact Skin:  ntact in exposed areas . Sacral area - has tape Neurologic:  Sedation - none -> RASS - +1 to -2. Gag +. dEnise hemiplegia. 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 Mild sleepiness. Follows some commands. Similar to yeasterday thought midline shift worse and on 3% saline  Plan - Per stroke team    Acute respiratory failure - post neuro IR in setting L MCA stroke / s/p extubation 4/22   03/27/2020 - >remains extubated but at high risk for reintubation given large stroke  Plan  [facility]ate if mental status  orsens   Hx HTN - cleviprex gtt required on arrival - SBP goal between 120-140  - off gtt 03/27/2020 x 24h   Hx Type 2 DM  - CBG goal < 180  - Improving with SSI, continue to monitor   Best practice:  Diet: NPO Pain/Anxiety/Delirium protocol (if indicated): PAD protocol - prn VAP protocol (if indicated): ordered  DVT prophylaxis: SCD's GI prophylaxis: pepcid Glucose control: SSI Mobility: BR Code Status: full Family Communication: Per primary Disposition: icu 4N    SIGNATURE    Dr. Kalman ShanMurali Romelo Sciandra, M.D., F.C.C.P,  Pulmonary and Critical Care Medicine Staff Physician, [facility] Center Director - Interstitial Lung Disease  Program  Pulmonary Fibrosis [facility] [place], [place], [id number]  Pager: [phone], If no answer or between  15:00h - 7:00h: call [phone] Telephone: [phone]  12:43 PM 03/27/2020      LABS    PULMONARY Recent Labs  Lab 03/24/20 0613 03/24/20 1209  PHART  --  7.361  PCO2ART  --  39.5  PO2ART  --  257.0*  HCO3  --  22.4  TCO2 27 24  O2SAT  --  100.0    CBC Recent Labs  Lab 03/25/20 1000 03/26/20 0556 03/27/20 0706  HGB 12.9* 13.5 14.6  HCT 36.2* 38.4* 41.9  WBC 9.3 11.1* 13.3*  PLT 118* 116* 158    COAGULATION Recent Labs  Lab 03/24/20 0601  INR 1.0    CARDIAC  No results for input(s): TROPONINI in the last 168 hours. No results for input(s): PROBNP in the last 168 hours.   CHEMISTRY Recent Labs  Lab 03/24/20 0601 03/24/20 0601 03/24/20 0613 03/24/20 1209 03/25/20 0307 03/25/20 0307 03/25/20 1000 03/25/20 1000 03/26/20 0556 03/27/20 0706 03/27/20 1042  NA 130*   < > 133*   < > 138  --  137  --  143 143 143  K 3.3*   < > 3.3*   < > 3.3*   < > 3.4*   < > 3.8 2.9*  --   CL 94*   < > 96*  --  107  --  106  --  110 109  --   CO2 26  --   --   --  22  --  21*  --  22 23  --   GLUCOSE 309*   < > 293*  --  186*  --  179*  --  126* 144*  --     BUN 20   < > 20  --  12  --  11  --  16 20  --   CREATININE 0.94   < > 0.90  --  0.91  --  0.90  --  0.96 0.80  --   CALCIUM 8.9  --   --   --  8.6*  --  8.5*  --  9.2 9.4  --   MG  --   --   --   --   --   --  2.1  --   --  2.3  --   PHOS  --   --   --   --   --   --  2.4*  --   --  2.5  --    < > = values in this interval not displayed.   Estimated Creatinine Clearance: 97 mL/min (by C-G formula based on SCr of 0.8 mg/dL).   LIVER Recent Labs  Lab 03/24/20 0601 03/25/20 1000 03/27/20 0706  AST ALT ALKPHOS 90 50 54  BILITOT 0.7 1.0 1.5*  PROT 6.5 5.9* 6.7  ALBUMIN 3.8 3.2* 3.3*  INR 1.0  --   --      INFECTIOUS No results for input(s): LATICACIDVEN, PROCALCITON in the last 168 hours.   ENDOCRINE CBG (last 3)  Recent Labs    03/27/20 0321 03/27/20 0755 03/27/20 1149  GLUCAP 159* 144* 177*         IMAGING x48h  - image(s) personally visualized  -   highlighted in bold DG Abd 1 View  Result Date: 03/26/2020 CLINICAL DATA:  Patient for NG tube placement. EXAM: ABDOMEN - 1 VIEW COMPARISON:  None. FINDINGS: Two radiographs of the abdomen were obtained. On the second radiograph the enteric tube and side port terminate within the stomach. Lung bases are clear. Nonobstructed bowel gas pattern. On the initial radiograph the enteric tube was coiled within the esophagus. IMPRESSION: Enteric tube tip and side port project within the stomach. Electronically Signed   By: Annia Belt M.D.   On: 03/26/2020 12:57   CT HEAD WO CONTRAST  Addendum Date: 03/27/2020   ADDENDUM REPORT: 03/27/2020 10:44 ADDENDUM: Study discussed by telephone with Dr. Marvel Plan on 03/27/2020 at 1039 hours. Electronically Signed   By: Odessa Fleming M.D.   On: 03/27/2020 10:44   Result Date: 03/27/2020 CLINICAL DATA:  59 year old male status post code stroke presentation yesterday with right side weakness, left MCA M1 ELV0. Status post endovascular revascularization including rescue  stenting. EXAM: CT HEAD WITHOUT CONTRAST TECHNIQUE: Contiguous axial images were obtained from the base of the skull through the vertex without intravenous contrast. COMPARISON:  Brain MRI and head CT 03/25/2020 and earlier. FINDINGS: Brain: Cytotoxic edema from large left MCA territory infarct. Increased intracranial mass effect since 03/25/2020, midline shift now 5-6 mm. Mass effect on the left lateral ventricle, but no ventriculomegaly. Continued, trapped hyperdense material in the left sylvian fissure and other nearby subarachnoid spaces-blood versus contrast. No intraventricular hemorrhage. There is a trace amount of blood layering along the midline tentorium on series 3, image 15. No new intracranial blood. Gray-white matter differentiation elsewhere remains normal. Basilar cisterns remain patent. Vascular: Stable left MCA stent. Skull: Stable, intact. Sinuses/Orbits: Visualized paranasal sinuses and mastoids are stable and well pneumatized. Right nasoenteric tube in place. Other: No acute orbit or scalp soft tissue findings. IMPRESSION: 1. Continued evolution of large Left MCA infarct with increased intracranial mass effect since 03/26/2019. Rightward midline shift now 5-6 mm. Basilar cisterns remain patent. 2. Stable left hemisphere subarachnoid hemorrhage and/or contrast, including trace blood layering along the midline tentorium. 3. No new intracranial abnormality. Electronically Signed: By: Odessa Fleming M.D. On: 03/27/2020 10:31   CT HEAD WO CONTRAST  Result Date: 03/25/2020 CLINICAL DATA:  59 year old male status post code  stroke presentation yesterday with right side weakness, left MCA M1 ELV0. Status post endovascular revascularization including rescue stenting. Postprocedure subarachnoid blood versus contrast. EXAM: CT HEAD WITHOUT CONTRAST TECHNIQUE: Contiguous axial images were obtained from the base of the skull through the vertex without intravenous contrast. COMPARISON:  Postprocedure head CT  03/24/2020 and earlier. FINDINGS: Brain: Cytotoxic edema in the left hemisphere resembling the T-max greater than 6 volume on CTP yesterday (series 3, image 16). Expected evolution since 1830 hours yesterday. But only mild intracranial mass effect at this time with trace rightward midline shift. Persistent high density in the left subarachnoid space around the sylvian fissure more resembles contrast than hemorrhage. And configuration is unchanged since yesterday. No intraventricular hemorrhage. No ventriculomegaly. Gray-white matter differentiation remains normal in the right hemisphere and posterior fossa. Basilar cisterns are patent. Vascular: Left MCA M1 through bifurcation stent redemonstrated. Skull: No acute osseous abnormality identified. Sinuses/Orbits: Visualized paranasal sinuses and mastoids are stable and well pneumatized. Other: Fluid in the nasopharynx, intubated on the scalp view. Visualized orbits and scalp soft tissues are within normal limits. IMPRESSION: 1. Evolving large left MCA infarct with expected appearance compared to 1830 hours yesterday. 2. No definite hemorrhagic conversion, and only mild intracranial mass effect at this time - trace midline shift. 3. Persistent high density in the subarachnoid spaces in and around the left Sylvian fissure more resembles contrast than blood. 4. No new intracranial abnormality. Electronically Signed   By: Odessa Fleming M.D.   On: 03/25/2020 14:41   MR ANGIO HEAD WO CONTRAST  Result Date: 03/26/2020 CLINICAL DATA:  Follow-up examination for acute stroke. Previously identified left M1 occlusion, status post tPA and catheter directed revascularization with stent placement. EXAM: MRI HEAD WITHOUT CONTRAST MRA HEAD WITHOUT CONTRAST TECHNIQUE: Multiplanar, multiecho pulse sequences of the brain and surrounding structures were obtained without intravenous contrast. Angiographic images of the head were obtained using MRA technique without contrast. COMPARISON:   Prior head CT from earlier the same day as well as previous exams. FINDINGS: MRI HEAD FINDINGS Brain: Cerebral volume within normal limits for age. Minimal chronic microvascular ischemic disease noted within the supratentorial cerebral white matter. Large confluent area of restricted diffusion involving essentially the entirety of the left MCA distribution seen involving the left cerebral hemisphere. Associated gyral swelling and edema throughout the area of infarction. Associated regional mass effect with early left-to-right midline shift measuring up to 3 mm. Scattered T1 hyperintensity with extensive susceptibility artifact throughout the left sylvian fissure felt to be most consistent with subarachnoid hemorrhage. Additional scattered foci of subarachnoid blood seen scattered within the cortical sulci through the area of infarction, although some of this susceptibility signal likely reflects engorgement of the cortical veins. Small amount of subarachnoid blood also seen along the suprasellar cistern and likely within the interpeduncular cistern. No frank hemorrhagic transformation or intraparenchymal hemorrhage. No other areas of acute or subacute infarct. Gray-white matter differentiation otherwise maintained. No other areas of chronic cortical infarction. No mass lesion or extra-axial fluid collection. No hydrocephalus or ventricular trapping. Vascular: Vascular stent in place within the left M1 segment. Major intracranial vascular flow voids otherwise maintained. Skull and upper cervical spine: Craniocervical junction within normal limits. Bone marrow signal intensity normal. No scalp soft tissue abnormality. Sinuses/Orbits: Globes and orbital soft tissues within normal limits. Mild scattered mucosal thickening noted within the ethmoidal air cells. Paranasal sinuses are otherwise clear. Fluid seen layering within the nasopharynx with trace bilateral mastoid effusions. Patient is intubated. Other: None. MRA  HEAD  FINDINGS ANTERIOR CIRCULATION: Visualized distal cervical segments of the internal carotid arteries are patent with fairly symmetric antegrade flow. Petrous segments widely patent bilaterally. Cavernous and supraclinoid right ICA are widely patent. On the left, there is a focal 2-3 mm filling defect involving the proximal cavernous left ICA, suspicious for possible small amount of intraluminal thrombus (series 3, image 50). This is not seen on prior CTA. Associated moderate stenosis at this level. Cavernous and supraclinoid left ICA otherwise widely patent. ICA termini well perfused. A1 segments patent bilaterally. Normal anterior communicating artery. Anterior cerebral arteries are widely patent to their distal aspects. Right M1 widely patent. Normal right MCA bifurcation. Distal right MCA branches well perfused. On the left, vascular stent in place within the left M1 segment, extending across the left MCA bifurcation into proximal left M2 branch. No discernible flow seen through the stent. No flow related signal seen distally within the left MCA branches, suggesting reocclusion. POSTERIOR CIRCULATION: Partially visualized right vertebral artery remains patent. 2 mm focal outpouching extending posteriorly from the vertebrobasilar junction noted, possibly a small aneurysm versus vascular infundibulum, stable (series 3, image 23). Hypoplastic left vertebral artery terminates in PICA, not well seen on this exam. Partially visualized picas appear patent. Basilar remains patent to its distal aspect without stenosis. Superior cerebral arteries patent bilaterally. Both PCAs primarily supplied via the basilar. PCAs remain well perfused to their distal aspects without stenosis. IMPRESSION: MRI HEAD IMPRESSION: 1. Continued interval evolution of large left MCA territory infarct. Associated cytotoxic edema with regional mass effect and 3 mm of left-to-right shift. 2. Scattered susceptibility artifact throughout the left  sylvian fissure and left cerebral cortical sulci, most consistent with subarachnoid hemorrhage. Additional small volume subarachnoid blood also seen within the basilar cisterns. No frank intraparenchymal hemorrhage or hemorrhagic transformation. MRA HEAD IMPRESSION: 1. Vascular stent in place within the left M1 segment, with no discernible flow through the stent or distally within the left MCA distribution, consistent with reocclusion. 2. Focal 2-3 mm filling defect within the proximal cavernous left ICA as above, suspicious for a small amount of intraluminal thrombus. This is new from previous. 3. 2 mm focal outpouching extending posteriorly from the vertebrobasilar junction, which could reflect a small vascular infundibulum versus aneurysm. Electronically Signed   By: Rise Mu M.D.   On: 03/26/2020 00:46   MR BRAIN WO CONTRAST  Result Date: 03/26/2020 CLINICAL DATA:  Follow-up examination for acute stroke. Previously identified left M1 occlusion, status post tPA and catheter directed revascularization with stent placement. EXAM: MRI HEAD WITHOUT CONTRAST MRA HEAD WITHOUT CONTRAST TECHNIQUE: Multiplanar, multiecho pulse sequences of the brain and surrounding structures were obtained without intravenous contrast. Angiographic images of the head were obtained using MRA technique without contrast. COMPARISON:  Prior head CT from earlier the same day as well as previous exams. FINDINGS: MRI HEAD FINDINGS Brain: Cerebral volume within normal limits for age. Minimal chronic microvascular ischemic disease noted within the supratentorial cerebral white matter. Large confluent area of restricted diffusion involving essentially the entirety of the left MCA distribution seen involving the left cerebral hemisphere. Associated gyral swelling and edema throughout the area of infarction. Associated regional mass effect with early left-to-right midline shift measuring up to 3 mm. Scattered T1 hyperintensity with  extensive susceptibility artifact throughout the left sylvian fissure felt to be most consistent with subarachnoid hemorrhage. Additional scattered foci of subarachnoid blood seen scattered within the cortical sulci through the area of infarction, although some of this susceptibility signal likely reflects engorgement of  the cortical veins. Small amount of subarachnoid blood also seen along the suprasellar cistern and likely within the interpeduncular cistern. No frank hemorrhagic transformation or intraparenchymal hemorrhage. No other areas of acute or subacute infarct. Gray-white matter differentiation otherwise maintained. No other areas of chronic cortical infarction. No mass lesion or extra-axial fluid collection. No hydrocephalus or ventricular trapping. Vascular: Vascular stent in place within the left M1 segment. Major intracranial vascular flow voids otherwise maintained. Skull and upper cervical spine: Craniocervical junction within normal limits. Bone marrow signal intensity normal. No scalp soft tissue abnormality. Sinuses/Orbits: Globes and orbital soft tissues within normal limits. Mild scattered mucosal thickening noted within the ethmoidal air cells. Paranasal sinuses are otherwise clear. Fluid seen layering within the nasopharynx with trace bilateral mastoid effusions. Patient is intubated. Other: None. MRA HEAD FINDINGS ANTERIOR CIRCULATION: Visualized distal cervical segments of the internal carotid arteries are patent with fairly symmetric antegrade flow. Petrous segments widely patent bilaterally. Cavernous and supraclinoid right ICA are widely patent. On the left, there is a focal 2-3 mm filling defect involving the proximal cavernous left ICA, suspicious for possible small amount of intraluminal thrombus (series 3, image 50). This is not seen on prior CTA. Associated moderate stenosis at this level. Cavernous and supraclinoid left ICA otherwise widely patent. ICA termini well perfused. A1  segments patent bilaterally. Normal anterior communicating artery. Anterior cerebral arteries are widely patent to their distal aspects. Right M1 widely patent. Normal right MCA bifurcation. Distal right MCA branches well perfused. On the left, vascular stent in place within the left M1 segment, extending across the left MCA bifurcation into proximal left M2 branch. No discernible flow seen through the stent. No flow related signal seen distally within the left MCA branches, suggesting reocclusion. POSTERIOR CIRCULATION: Partially visualized right vertebral artery remains patent. 2 mm focal outpouching extending posteriorly from the vertebrobasilar junction noted, possibly a small aneurysm versus vascular infundibulum, stable (series 3, image 23). Hypoplastic left vertebral artery terminates in PICA, not well seen on this exam. Partially visualized picas appear patent. Basilar remains patent to its distal aspect without stenosis. Superior cerebral arteries patent bilaterally. Both PCAs primarily supplied via the basilar. PCAs remain well perfused to their distal aspects without stenosis. IMPRESSION: MRI HEAD IMPRESSION: 1. Continued interval evolution of large left MCA territory infarct. Associated cytotoxic edema with regional mass effect and 3 mm of left-to-right shift. 2. Scattered susceptibility artifact throughout the left sylvian fissure and left cerebral cortical sulci, most consistent with subarachnoid hemorrhage. Additional small volume subarachnoid blood also seen within the basilar cisterns. No frank intraparenchymal hemorrhage or hemorrhagic transformation. MRA HEAD IMPRESSION: 1. Vascular stent in place within the left M1 segment, with no discernible flow through the stent or distally within the left MCA distribution, consistent with reocclusion. 2. Focal 2-3 mm filling defect within the proximal cavernous left ICA as above, suspicious for a small amount of intraluminal thrombus. This is new from  previous. 3. 2 mm focal outpouching extending posteriorly from the vertebrobasilar junction, which could reflect a small vascular infundibulum versus aneurysm. Electronically Signed   By: Rise Mu M.D.   On: 03/26/2020 00:46   VAS [name] LOWER EXTREMITY VENOUS (DVT)  Result Date: 03/26/2020  Lower Venous DVTStudy Indications: Stroke.  Comparison Study: no prior Performing Technologist: [name] RDMS, RVT  Examination Guidelines: A complete evaluation includes B-mode imaging, spectral Doppler, color Doppler, and power Doppler as needed of all accessible portions of each vessel. Bilateral testing is considered an integral part of a  complete examination. Limited examinations for reoccurring indications may be performed as noted. The reflux portion of the exam is performed with the patient in reverse Trendelenburg.  +---------+---------------+---------+-----------+----------+--------------+ RIGHT    CompressibilityPhasicitySpontaneityPropertiesThrombus Aging +---------+---------------+---------+-----------+----------+--------------+ CFV      Full           Yes      Yes                                 +---------+---------------+---------+-----------+----------+--------------+ SFJ      Full                                                        +---------+---------------+---------+-----------+----------+--------------+ FV Prox  Full                                                        +---------+---------------+---------+-----------+----------+--------------+ FV Mid   Full                                                        +---------+---------------+---------+-----------+----------+--------------+ FV DistalFull                                                        +---------+---------------+---------+-----------+----------+--------------+ PFV      Full                                                         +---------+---------------+---------+-----------+----------+--------------+ POP      Full           Yes      Yes                                 +---------+---------------+---------+-----------+----------+--------------+ PTV      Full                                                        +---------+---------------+---------+-----------+----------+--------------+ PERO     Full                                                        +---------+---------------+---------+-----------+----------+--------------+   +---------+---------------+---------+-----------+----------+--------------+ LEFT     CompressibilityPhasicitySpontaneityPropertiesThrombus Aging +---------+---------------+---------+-----------+----------+--------------+ CFV      Full  Yes      Yes                                 +---------+---------------+---------+-----------+----------+--------------+ SFJ      Full                                                        +---------+---------------+---------+-----------+----------+--------------+ FV Prox  Full                                                        +---------+---------------+---------+-----------+----------+--------------+ FV Mid   Full                                                        +---------+---------------+---------+-----------+----------+--------------+ FV DistalFull                                                        +---------+---------------+---------+-----------+----------+--------------+ PFV      Full                                                        +---------+---------------+---------+-----------+----------+--------------+ POP      Full           Yes      Yes                                 +---------+---------------+---------+-----------+----------+--------------+ PTV      Full                                                         +---------+---------------+---------+-----------+----------+--------------+ PERO     Full                                                        +---------+---------------+---------+-----------+----------+--------------+     Summary: RIGHT: - There is no evidence of deep vein thrombosis in the lower extremity.  - No cystic structure found in the popliteal fossa.  LEFT: - There is no evidence of deep vein thrombosis in the lower extremity.  - No cystic structure found in the popliteal fossa.  *See table(s) above for measurements and observations. Electronically signed by Sherald Hess MD on 03/26/2020 at 8:08:31  PM.    Final

## 2020-03-27 NOTE — Procedures (Signed)
Cortrak  Person Inserting Tube:  Renie Ora, RD Tube Type:  Cortrak - 43 inches Tube Location:  Right nare Initial Placement:  Stomach Secured by: Bridle Technique Used to Measure Tube Placement:  Documented cm marking at nare/ corner of mouth Cortrak Secured At:  71 cm Procedure Comments:  Cortrak Tube Team Note:  Consult received to place a Cortrak feeding tube.   No x-ray is required. RN may begin using tube.   If the tube becomes dislodged please keep the tube and contact the Cortrak team at [email or web address] (password [email or web address]) for replacement.  If after hours and replacement cannot be delayed, place a NG tube and confirm placement with an abdominal x-ray.     Trenton Gammon, MS, RD, LDN, CNSC Inpatient Clinical Dietitian RD pager # available in [facility]  After hours/weekend pager # available in [facility]

## 2020-03-27 NOTE — Progress Notes (Signed)
SLP Cancellation Note  Patient Details Name: Brad Delacruz MRN: [id number] DOB: 02-12-1961   Cancelled assessment:       Reason Eval/Treat Not Completed: Fatigue/lethargy limiting ability to participate.  Cortrak placed today. Will follow for readiness for speech and swallow evaluations.  Jeffrey Voth L. Samson Frederic, MA CCC/SLP Acute Rehabilitation Services Office number [phone] Pager [phone]    Carolan Shiver 03/27/2020, 2:46 PM

## 2020-03-27 NOTE — Progress Notes (Signed)
Physical Therapy Treatment Patient Details Name: Brad Delacruz MRN: [id number] DOB: Feb 24, 1961 Today's Date: 03/27/2020    History of Present Illness 59 y.o. male past medical history of diabetes, hypertension which he does not take medication for, presented to the emergency room at [facility] with last known well of 5 AM when he was at work and was noted to have a sudden onset of right-sided weakness and inability to talk. Pt given TPA and s/p thrombectomy on 4/20. Pt remains intubated and restrained as of 4/22.    PT Comments    Pt lethargic during session demonstrating very limited and inconsistent ability to follow commands. Pt total A for all mobility with strong posterior lean sitting at edge of bed. Pt provides some education to family on utilizing mobility as an attempt to arouse patient. Pt remains at a high falls risk due to impaired balance, strength, and cognition. Pt will benefit from continued acute PT POC to improve mobility and reduce caregiver burden.   Follow Up Recommendations  SNF;Supervision/Assistance - 24 hour     Equipment Recommendations  Wheelchair (measurements PT);Wheelchair cushion (measurements PT);Hospital bed;Other (comment)(mechanical lift, if home today)    Recommendations for Other Services       Precautions / Restrictions Precautions Precautions: Fall Restrictions Weight Bearing Restrictions: No    Mobility  Bed Mobility Overal bed mobility: Needs Assistance Bed Mobility: Supine to Sit;Rolling;Sit to Supine Rolling: Total assist   Supine to sit: Total assist Sit to supine: Total assist      Transfers Overall transfer level: Needs assistance Equipment used: 1 person hand held assist Transfers: Sit to/from Stand Sit to Stand: Total assist;From elevated surface            Ambulation/Gait                 Stairs             Wheelchair Mobility    Modified Rankin (Stroke Patients Only) Modified Rankin  (Stroke Patients Only) Pre-Morbid Rankin Score: No symptoms Modified Rankin: Severe disability     Balance Overall balance assessment: Needs assistance Sitting-balance support: No upper extremity supported;Feet supported Sitting balance-Leahy Scale: Zero Sitting balance - Comments: maxA to maintain sitting balance, posterior and left lean Postural control: Posterior lean;Left lateral lean Standing balance support: Bilateral upper extremity supported Standing balance-Leahy Scale: Zero Standing balance comment: totalA to maintain standing                            Cognition Arousal/Alertness: Lethargic Behavior During Therapy: Flat affect Overall Cognitive Status: Difficult to assess                                 General Comments: pt opens L eye twice on command in spanish, otherwise does not follow any commands during session      Exercises      General Comments General comments (skin integrity, edema, etc.): pt on room air, lethargic, unable to follow commands consistently, famiyl provides spanish cues to no significant improvement in command following.       Pertinent Vitals/Pain Pain Assessment: Faces Faces Pain Scale: No hurt    Home Living                      Prior Function            PT Goals (current  goals can now be found in the care plan section) Acute Rehab PT Goals Patient Stated Goal: Per family, return home safely Progress towards PT goals: Not progressing toward goals - comment    Frequency    Min 3X/week      PT Plan Current plan remains appropriate    Co-evaluation              AM-PAC PT "6 Clicks" Mobility   Outcome Measure  Help needed turning from your back to your side while in a flat bed without using bedrails?: Total Help needed moving from lying on your back to sitting on the side of a flat bed without using bedrails?: Total Help needed moving to and from a bed to a chair (including a  wheelchair)?: Total Help needed standing up from a chair using your arms (e.g., wheelchair or bedside chair)?: Total Help needed to walk in hospital room?: Total Help needed climbing 3-5 steps with a railing? : Total 6 Click Score: 6    End of Session   Activity Tolerance: Treatment limited secondary to medical complications (Comment)(lethargy) Patient left: in bed;with call bell/phone within reach;with bed alarm set;with SCD's reapplied Nurse Communication: Mobility status PT Visit Diagnosis: Muscle weakness (generalized) (M62.81);Other symptoms and signs involving the nervous system (R29.898);Hemiplegia and hemiparesis Hemiplegia - Right/Left: Right Hemiplegia - caused by: Cerebral infarction     Time: 1705-1730 PT Time Calculation (min) (ACUTE ONLY): 25 min  Charges:  $Therapeutic Activity: 23-37 mins                     Zenaida Niece, PT, DPT Acute Rehabilitation Pager: [phone]    Zenaida Niece 03/27/2020, 5:44 PM

## 2020-03-27 NOTE — Progress Notes (Signed)
Initial Nutrition Assessment  DOCUMENTATION CODES:   Not applicable  INTERVENTION:   Initiate Vital AF 1.2 @ 70 ml/hr (1680 ml/hr) via Cortrak tube  Provides: 2016 kcal, 126 grams protein, and 1360 ml free water.    NUTRITION DIAGNOSIS:   Inadequate oral intake related to inability to eat as evidenced by NPO status.  GOAL:   Patient will meet greater than or equal to 90% of their needs  MONITOR:   TF tolerance, Labs  REASON FOR ASSESSMENT:   Consult Enteral/tube feeding initiation and management  ASSESSMENT:   Pt with PMH of DM, HTN which he does not take medication for admitted with L MCA s/p IR for thrombectomy and stent on 4/20.   Pt discussed during ICU rounds and with RN.  Pt lethargic and failed swallow  Per critical care pt is at high risk of  4/22 NG placed and started on TF 4/23 Cortrak placed  Medications reviewed and include: colace, folic acid, MVI, miralax, thiamine  Hypertonic saline @ 50 ml/hr Labs reviewed: K+ 2.9 (L)   TF: Vital High Protein @ 40 ml/hr with 30 ml Prostat BID Provides: 1160 kcal and 114 grams protein   NUTRITION - FOCUSED PHYSICAL EXAM:    Most Recent Value  Orbital Region  No depletion  Upper Arm Region  No depletion  Thoracic and Lumbar Region  No depletion  Buccal Region  No depletion  Temple Region  No depletion  Clavicle Bone Region  No depletion  Clavicle and Acromion Bone Region  No depletion  Scapular Bone Region  No depletion  Dorsal Hand  No depletion  Patellar Region  No depletion  Anterior Thigh Region  No depletion  Posterior Calf Region  No depletion  Edema (RD Assessment)  None  Hair  Reviewed  Eyes  Unable to assess  Mouth  Unable to assess  Skin  Reviewed  Nails  Reviewed       Diet Order:   Diet Order            Diet NPO time specified  Diet effective now              EDUCATION NEEDS:   No education needs have been identified at this time  Skin:  Skin Assessment: Reviewed RN  Assessment  Last BM:  unknown  Height:   Ht Readings from Last 1 Encounters:  03/24/20 5\' 5"  (1.651 m)    Weight:   Wt Readings from Last 1 Encounters:  03/27/20 80.3 kg    Ideal Body Weight:  61.8 kg  BMI:  Body mass index is 29.46 kg/m.  Estimated Nutritional Needs:   Kcal:  2000-2200  Protein:  105-125 grams  Fluid:  > 2 L/day  03/29/20., RD, LDN, CNSC See [facility] for contact information

## 2020-03-27 NOTE — Progress Notes (Signed)
STROKE TEAM PROGRESS NOTE   INTERVAL HISTORY No family at the bedside.  Patient tolerating well from extubation yesterday.  Still global aphasia and right hemiplegia as well as left gaze and right neglect.  CT showed large left MCA infarct with more clear demarcation.  Cerebral edema started with midline shift 5 mm.  Given neurologically stable, will repeat CT in a.m.  Vitals:   03/27/20 0600 03/27/20 0630 03/27/20 0800 03/27/20 0900  BP: (!) 164/77 (!) 151/94 (!) 153/95 (!) 148/70  Pulse: (!) 57 67 (!) 55 74  Resp: 15 17 16 15   Temp:   98.8 F (37.1 C)   TempSrc:   Axillary   SpO2: 99% 99% 98% 97%  Weight:      Height:        CBC:  Recent Labs  Lab 03/25/20 0307 03/25/20 1000 03/26/20 0556 03/27/20 0706  WBC 11.3*   < > 11.1* 13.3*  NEUTROABS 9.6*  --   --  10.9*  HGB 13.0   < > 13.5 14.6  HCT 36.3*   < > 38.4* 41.9  MCV 89.0   < > 91.0 89.9  PLT 135*   < > 116* 158   < > = values in this interval not displayed.    Basic Metabolic Panel:  Recent Labs  Lab 03/25/20 1000 03/25/20 1000 03/26/20 0556 03/27/20 0706  NA 137   < > 143 143  K 3.4*   < > 3.8 2.9*  CL 106   < > 110 109  CO2 21*   < > 22 23  GLUCOSE 179*   < > 126* 144*  BUN 11   < > 16 20  CREATININE 0.90   < > 0.96 0.80  CALCIUM 8.5*   < > 9.2 9.4  MG 2.1  --   --  2.3  PHOS 2.4*  --   --  2.5   < > = values in this interval not displayed.   Lipid Panel:     Component Value Date/Time   CHOL 180 03/25/2020 0307   TRIG 269 (H) 03/26/2020 0556   HDL 21 (L) 03/25/2020 0307   CHOLHDL 8.6 03/25/2020 0307   VLDL UNABLE TO CALCULATE IF TRIGLYCERIDE OVER 400 mg/dL 03/27/2020 67/67/2094   LDLCALC UNABLE TO CALCULATE IF TRIGLYCERIDE OVER 400 mg/dL 7096 28/36/6294   7654:  Lab Results  Component Value Date   HGBA1C 9.2 (H) 03/24/2020   Urine Drug Screen:     Component Value Date/Time   LABOPIA NONE DETECTED 03/25/2020 0229   COCAINSCRNUR NONE DETECTED 03/25/2020 0229   LABBENZ POSITIVE (A) 03/25/2020  0229   AMPHETMU NONE DETECTED 03/25/2020 0229   THCU NONE DETECTED 03/25/2020 0229   LABBARB NONE DETECTED 03/25/2020 0229    Alcohol Level     Component Value Date/Time   ETH <10 03/24/2020 0601    IMAGING past 24 hours DG Abd 1 View  Result Date: 03/26/2020 CLINICAL DATA:  Patient for NG tube placement. EXAM: ABDOMEN - 1 VIEW COMPARISON:  None. FINDINGS: Two radiographs of the abdomen were obtained. On the second radiograph the enteric tube and side port terminate within the stomach. Lung bases are clear. Nonobstructed bowel gas pattern. On the initial radiograph the enteric tube was coiled within the esophagus. IMPRESSION: Enteric tube tip and side port project within the stomach. Electronically Signed   By: 03/28/2020 M.D.   On: 03/26/2020 12:57   CT HEAD WO CONTRAST  Result Date: 03/27/2020 CLINICAL DATA:  59 year old male status post code stroke presentation yesterday with right side weakness, left MCA M1 ELV0. Status post endovascular revascularization including rescue stenting. EXAM: CT HEAD WITHOUT CONTRAST TECHNIQUE: Contiguous axial images were obtained from the base of the skull through the vertex without intravenous contrast. COMPARISON:  Brain MRI and head CT 03/25/2020 and earlier. FINDINGS: Brain: Cytotoxic edema from large left MCA territory infarct. Increased intracranial mass effect since 03/25/2020, midline shift now 5-6 mm. Mass effect on the left lateral ventricle, but no ventriculomegaly. Continued, trapped hyperdense material in the left sylvian fissure and other nearby subarachnoid spaces-blood versus contrast. No intraventricular hemorrhage. There is a trace amount of blood layering along the midline tentorium on series 3, image 15. No new intracranial blood. Gray-white matter differentiation elsewhere remains normal. Basilar cisterns remain patent. Vascular: Stable left MCA stent. Skull: Stable, intact. Sinuses/Orbits: Visualized paranasal sinuses and mastoids are stable  and well pneumatized. Right nasoenteric tube in place. Other: No acute orbit or scalp soft tissue findings. IMPRESSION: 1. Continued evolution of large Left MCA infarct with increased intracranial mass effect since 03/26/59. Rightward midline shift now 5-6 mm. Basilar cisterns remain patent. 2. Stable left hemisphere subarachnoid hemorrhage and/or contrast, including trace blood layering along the midline tentorium. 3. No new intracranial abnormality. Electronically Signed   By: Genevie Ann M.D.   On: 03/27/2020 10:31   VAS [name] LOWER EXTREMITY VENOUS (DVT)  Result Date: 03/26/2020  Lower Venous DVTStudy Indications: Stroke.  Comparison Study: no prior Performing Technologist: June Leap RDMS, RVT  Examination Guidelines: A complete evaluation includes B-mode imaging, spectral Doppler, color Doppler, and power Doppler as needed of all accessible portions of each vessel. Bilateral testing is considered an integral part of a complete examination. Limited examinations for reoccurring indications may be performed as noted. The reflux portion of the exam is performed with the patient in reverse Trendelenburg.  +---------+---------------+---------+-----------+----------+--------------+ RIGHT    CompressibilityPhasicitySpontaneityPropertiesThrombus Aging +---------+---------------+---------+-----------+----------+--------------+ CFV      Full           Yes      Yes                                 +---------+---------------+---------+-----------+----------+--------------+ SFJ      Full                                                        +---------+---------------+---------+-----------+----------+--------------+ FV Prox  Full                                                        +---------+---------------+---------+-----------+----------+--------------+ FV Mid   Full                                                         +---------+---------------+---------+-----------+----------+--------------+ FV DistalFull                                                        +---------+---------------+---------+-----------+----------+--------------+  PFV      Full                                                        +---------+---------------+---------+-----------+----------+--------------+ POP      Full           Yes      Yes                                 +---------+---------------+---------+-----------+----------+--------------+ PTV      Full                                                        +---------+---------------+---------+-----------+----------+--------------+ PERO     Full                                                        +---------+---------------+---------+-----------+----------+--------------+   +---------+---------------+---------+-----------+----------+--------------+ LEFT     CompressibilityPhasicitySpontaneityPropertiesThrombus Aging +---------+---------------+---------+-----------+----------+--------------+ CFV      Full           Yes      Yes                                 +---------+---------------+---------+-----------+----------+--------------+ SFJ      Full                                                        +---------+---------------+---------+-----------+----------+--------------+ FV Prox  Full                                                        +---------+---------------+---------+-----------+----------+--------------+ FV Mid   Full                                                        +---------+---------------+---------+-----------+----------+--------------+ FV DistalFull                                                        +---------+---------------+---------+-----------+----------+--------------+ PFV      Full                                                         +---------+---------------+---------+-----------+----------+--------------+  POP      Full           Yes      Yes                                 +---------+---------------+---------+-----------+----------+--------------+ PTV      Full                                                        +---------+---------------+---------+-----------+----------+--------------+ PERO     Full                                                        +---------+---------------+---------+-----------+----------+--------------+     Summary: RIGHT: - There is no evidence of deep vein thrombosis in the lower extremity.  - No cystic structure found in the popliteal fossa.  LEFT: - There is no evidence of deep vein thrombosis in the lower extremity.  - No cystic structure found in the popliteal fossa.  *See table(s) above for measurements and observations. Electronically signed by Sherald Hess MD on 03/26/2020 at 8:08:31 PM.    Final      PHYSICAL EXAM   Temp:  [98.8 F (37.1 C)-100.5 F (38.1 C)] 98.8 F (37.1 C) (04/23 0800) Pulse Rate:  [55-107] 74 (04/23 0900) Resp:  [15-24] 15 (04/23 0900) BP: (141-171)/(54-95) 148/70 (04/23 0900) SpO2:  [93 %-100 %] 97 % (04/23 0900) FiO2 (%):  [40 %] 40 % (04/22 2000) Weight:  [80.3 kg] 80.3 kg (04/23 0500)  General - Well nourished, well developed, not in acute distress.  Ophthalmologic - fundi not visualized due to noncooperation.  Cardiovascular - Regular rate and rhythm.  Neuro - awake alert, eyes open, global aphasia, not following commands, not verbal. Eyes left gaze position, not able to cross midline, not blinking to visual threat on the right, able to track on the left but not right. Right facial droop.  Tongue protrusion not cooperative. Spontaneous movement of LUE and LLE, LUE at least 4/5, no drift when lifted in air. LLE 3/5 with pain stimulation. RUE flaccid, RLE slight withdraw to pain. DTR 1+ and no babinski. Sensation, coordination and  gait not tested.   ASSESSMENT/PLAN [name] is a 59 y.o. male with history of HTN and DB noncompliant w/ medications presenting to [facility] with R sided weakness and inability to talk. NIH 28. CTA showed L M1 occlusion. Transferred to [facility]. [facility]. On arrival, received IV tPA 03/24/2020 at 0725 followed by mechanical thrombectomy w/ TICI2C revascularization and rescue stent placement.  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 Old L corona radiata and internal capsule infarcts. ASPECTS 10.     CTA head & neck large L MCA territory infarct. L M1 LVO. Collateral vessels in sylvian fissure. B ICA bifurcation atherosclerosis. Aortic atherosclerosis.   CT perfusion large L MCA penumbra.  Cerebral angio L MCA M1 occlusion w/ TICI2c revascularization x 6 passes. Rescue stenting for reocclusion   CT head 4/21 - evolving large left MCA infarct, trace midline shift, persistent high density and left C1 fissure, contrast versus blood  MRI  Large L MCA infarct evolution w/ edema and mass effect w/ 60mm L midline shift. SAH L sylvian fissure, L cerebral cortical sulci and basilar cisterns.  MRA  L M1 stent w/o flow c/w reocclusion. L ICA filling defect c/w small thrombus. VBJ w/ infundibulum vs aneurysm.  CT head 4/23 continued evolution large L MCA infarct w/ increased edema, now w/ 5-60mm midline shift. Stable L SAH w/ trace blood in tentorium.    CT head 4/24 pending   2D Echo EF 55-60%. No source of embolus   LE doppler neg DVT  May consider TEE and loop recorder if neuro improvement later  LDL 55.1  TG 798->675->269  HgbA1c 9.2  SCDs for VTE prophylaxis  No antithrombotic prior to admission, now on aspirin 81 mg daily and Brilinta (ticagrelor) 90 mg  bid given stent placement and L tICA thrombus.   Therapy recommendations:  SNF   Disposition:  pending   Cerebral edema  CT showed large left MCA infarct with minimal midline shift  MRI  Large L MCA infarct evolution w/ edema and mass effect w/ 52mm L midline shift.   CT head 4/23 continued evolution large L MCA infarct w/ increased edema, now w/ 5-57mm midline shift. Stable L SAH w/ trace blood in tentorium.   On 3% saline @ 75 via peripheral line - may consider central line if needed  Goal Na 150-155  Na 143->248   CT head 4/24 pending   Based on CT in am, may need to consult NSG for possible hemicrani  Acute Respiratory Failure   Intubated for IR, left intubated post IR for airway protection secondary to acute L MCA stroke  Extubation 4/22  CCM on board  Tolerating well so far  Close monitoring - high risk for neuro decline and reintubation  Hypertensive Emergency  SBP > 210 on arrival  Home meds:  Non-compliant - med rec lists:  losartan 25   BP variable during the night w/ agitation  Cleviprex changed to cardene d/t elevated TG -> now off  BP goal < 160 given SAH  Hydralazine and labetalol PRN . Long-term BP goal normotensive  Hyperlipidemia  Home meds:  Non-compliant - med rec lists:  pravachol 10, crestor 5  LDL 55.1, goal < 70  TG 798->675->269   On lipitor 80  Continue statin at discharge  Diabetes type II Uncontrolled  Home meds:  Noncompliant - med rec lists:  dapagliflozin 10, glipizide 5, actos 30, dapagliflozin 10 / metformin 1000  HgbA1c 9.2, goal < 7.0  CBGs  SSI 0-15 q4  Dysphagia . Secondary to stroke . NPO . Speech on board . Put on TF @ 40 . Replace tube w/ Cortrak today   Other Stroke Risk Factors  Hx ETOH use, alcohol level <10, advised to drink no more than 2 drink(s) a day  Substance abuse - UDS:  Benzos POSITIVE  Obesity, Body mass index is 29.46 kg/m., recommend weight loss, diet and exercise as appropriate    Other Active Problems  Thrombocytopenia PLT [id number] - resolved  Hypokalemia K 3.3-4.1-3.3-3.4-3.8-2.9 - supplement   Urinary retention - I&O cath, foley prn  Leukocytosis WBC 11.3->9.3->11.1->13.3  Hospital day # 3  This patient is critically ill due to large left  MCA infarct, status post TPA and thrombectomy, uncontrolled diabetes, hypertension/hypotension and at significant risk of neurological worsening, death form recurrent stroke, hemorrhagic conversion, seizure, DKA, heart failure. This patient's care requires constant monitoring of vital signs, hemodynamics, respiratory and cardiac monitoring, review of multiple databases, neurological assessment, discussion with family, other specialists and medical decision making of high complexity. I spent 40 minutes of neurocritical care time in the care of this patient. I had long discussion with daughter over the phone, updated pt current condition, treatment plan and potential prognosis, and answered all the questions. She expressed understanding and appreciation. Informed her that pt neuro condition may worse over the weekend given the cerebral edema and surgical decompression may need to be considered. Daughter expressed understanding.   Marvel PlanJindong Tyquasia Pant, MD PhD Stroke Neurology 03/27/2020 10:15 AM  To contact Stroke Continuity provider, please refer to [email or web address]. After hours, contact General Neurology

## 2020-03-27 NOTE — Progress Notes (Signed)
Inpatient Rehab Admissions Coordinator Note:   Per OT recommendations, pt was screened for CIR candidacy by Estill Dooms, PT, DPT.  At this time we are recommending a CIR consult.  I will place an order per our protocol.  Please contact me with questions.   Estill Dooms, PT, DPT [phone] 03/27/20 5:02 PM

## 2020-03-27 NOTE — Plan of Care (Signed)
  Problem: Clinical Measurements: Goal: Respiratory complications will improve Outcome: Progressing   

## 2020-03-27 NOTE — Progress Notes (Signed)
Referring Physician(s): Code Stroke- Wilford Corner, Ashish  Supervising Physician: Julieanne Cotton  Patient Status:  [facility] - In-pt  Chief Complaint: None- lethargic  Subjective:  History of acute CVA s/p cerebral arteriogram with emergent mechanical thrombectomy and rescue stenting of left MCA M1 segment occlusion achieving a TICI 2C revascularization 03/24/2020 by Dr. Corliss Skains. Patient laying in bed resting comfortably. He is lethargic- opens eyes to voice and quickly shuts them. Does not follow simple commands. Moving left side spontaneously but no movements of right side. Right gaze preference. Right groin incision (access) and left groin incision (arterial line) sites c/d/i.   Allergies: Patient has no known allergies.  Medications: Prior to Admission medications   Medication Sig Start Date End Date Taking? Authorizing Provider  ciclopirox (PENLAC) 8 % solution Apply topically as directed. 10/03/18   [provider]  dapagliflozin propanediol (FARXIGA) 10 MG TABS tablet Take 10 mg by mouth daily. 02/11/18   [provider]  glipiZIDE (GLUCOTROL XL) 5 MG 24 hr tablet Take 5 mg by mouth 2 (two) times daily. 01/10/20   [provider]  losartan (COZAAR) 25 MG tablet Take 25 mg by mouth daily. 01/23/19   [provider]  meloxicam (MOBIC) 15 MG tablet Take 15 mg by mouth daily. 11/23/19   [provider]  pioglitazone (ACTOS) 30 MG tablet Take 30 mg by mouth daily. 12/28/19   [provider]  pravastatin (PRAVACHOL) 10 MG tablet Take 10 mg by mouth daily. 09/07/18   [provider]  rosuvastatin (CRESTOR) 5 MG tablet Take 5 mg by mouth daily. 12/31/19   [provider]  XIGDUO XR 09-999 MG TB24 Take 1 tablet by mouth daily. 01/24/19   [provider]     Vital Signs: BP (!) 148/70   Pulse 74   Temp 98.8 F (37.1 C) (Axillary)   Resp 15   Ht  (1.651 m)   Wt 177 lb 0.5 oz (80.3 kg)   SpO2 97%   BMI  29.46 kg/m   Physical Exam Vitals and nursing note reviewed.  Constitutional:      General: He is not in acute distress.    Comments: Lethargic.  Pulmonary:     Effort: Pulmonary effort is normal. No respiratory distress.  Skin:    General: Skin is warm and dry.     Comments: Right groin incision (access) and left groin incision (arterial line) sites soft without active bleeding or hematoma.    Neurological:     Comments: Lethargic, opens eyes to voice but quickly shuts them. Does not follow simple commands. PERRL bilaterally. Right gaze preference. Moving left side spontaneously but no movements of right side. Distal pulses (DPs) 1+ bilaterally.     Imaging: CT Angio Head W or Wo Contrast  Result Date: 03/24/2020 CLINICAL DATA:  Patient found down at 5 a.m. Right-sided weakness. Abnormal speech. Facial droop. EXAM: CT ANGIOGRAPHY HEAD AND NECK CT PERFUSION BRAIN TECHNIQUE: Multidetector CT imaging of the head and neck was performed using the standard protocol during bolus administration of intravenous contrast. Multiplanar CT image reconstructions and MIPs were obtained to evaluate the vascular anatomy. Carotid stenosis measurements (when applicable) are obtained utilizing NASCET criteria, using the distal internal carotid diameter as the denominator. Multiphase CT imaging of the brain was performed following IV bolus contrast injection. Subsequent parametric perfusion maps were calculated using RAPID software. CONTRAST:  OMNIPAQUE IOHEXOL 350 MG/ML SOLN COMPARISON:  CT head without contrast 03/24/2020 FINDINGS: CTA NECK FINDINGS  Aortic arch: A 3 vessel arch configuration is present. Atherosclerotic calcifications are present at the aortic arch. No aneurysm or significant stenosis is present. Right carotid system: Right common carotid artery is within normal limits. Atherosclerotic changes are present at the bifurcation without significant stenosis. Study is mildly degraded by dental  artifact. Left carotid system: The left common carotid artery is within normal limits. Atherosclerotic changes are present at the proximal left ICA without a significant stenosis relative to the more distal vessel. Vertebral arteries: The right vertebral artery is the dominant vessel. Both vertebral arteries originate from the subclavian arteries without significant stenosis. Is no significant stenosis of either vertebral artery in the neck. Skeleton: Degenerative changes are present cervical spine with slight reversal of normal cervical lordosis. No focal lytic or blastic lesions are present. Other neck: The soft tissues the neck are unremarkable. Upper chest: Mild dependent atelectasis is present. Thoracic inlet is normal. Review of the MIP images confirms the above findings CTA HEAD FINDINGS Anterior circulation: Internal carotid arteries are within normal limits through the ICA termini bilaterally. The A1 segments are normal bilaterally. The anterior communicating artery is patent. Right M1 segment is normal. Left M1 segment is occluded proximal to the bifurcation. Collateral vessels are evident within the sylvian fissure. Bilateral ACA and right MCA branch vessels are within normal limits. Posterior circulation: Internal carotid right vertebral artery is the dominant vessel. The non dominant left vertebral artery terminates at the PICA. Right vertebral artery becomes the basilar artery. Both posterior cerebral arteries originate from basilar tip. Prominent left posterior communicating artery is present. PCA branch vessels are within normal limits bilaterally. Venous sinuses: The dural sinuses are patent. The straight sinus deep cerebral veins are intact. Cortical veins are within normal limits. Anatomic variants: Prominent left posterior communicating artery. Review of the MIP images confirms the above findings CT Brain Perfusion Findings: ASPECTS: 10/10 CBF (<30%) Volume: 83mL Perfusion (Tmax>6.0s) volume:  82mL Mismatch Volume: 99mL Infarction Location:Left MCA IMPRESSION: 1. Large left MCA territory nonhemorrhagic infarct. 2. Acute left M1 large vessel occlusion. 3. Large penumbra with mismatch volume of 99 mL. 4. Collateral vessels are present within the sylvian fissure. 5. Atherosclerotic changes at the carotid bifurcations bilaterally without significant stenosis. 6. Aortic Atherosclerosis (ICD10-I70.0). No significant stenosis at the arch. These results were called by telephone at the time of interpretation on 03/24/2020 at 6:13 am to provider [facility] , who verbally acknowledged these results. Electronically Signed   By: Marin Roberts M.D.   On: 03/24/2020 06:26   DG Abd 1 View  Result Date: 03/26/2020 CLINICAL DATA:  Patient for NG tube placement. EXAM: ABDOMEN - 1 VIEW COMPARISON:  None. FINDINGS: Two radiographs of the abdomen were obtained. On the second radiograph the enteric tube and side port terminate within the stomach. Lung bases are clear. Nonobstructed bowel gas pattern. On the initial radiograph the enteric tube was coiled within the esophagus. IMPRESSION: Enteric tube tip and side port project within the stomach. Electronically Signed   By: Annia Belt M.D.   On: 03/26/2020 12:57   CT HEAD WO CONTRAST  Result Date: 03/25/2020 CLINICAL DATA:  59 year old male status post code stroke presentation yesterday with right side weakness, left MCA M1 ELV0. Status post endovascular revascularization including rescue stenting. Postprocedure subarachnoid blood versus contrast. EXAM: CT HEAD WITHOUT CONTRAST TECHNIQUE: Contiguous axial images were obtained from the base of the skull through the vertex without intravenous contrast. COMPARISON:  Postprocedure head CT 03/24/2020 and earlier. FINDINGS: Brain: Cytotoxic  edema in the left hemisphere resembling the T-max greater than 6 volume on CTP yesterday (series 3, image 16). Expected evolution since 1830 hours yesterday. But only mild  intracranial mass effect at this time with trace rightward midline shift. Persistent high density in the left subarachnoid space around the sylvian fissure more resembles contrast than hemorrhage. And configuration is unchanged since yesterday. No intraventricular hemorrhage. No ventriculomegaly. Gray-white matter differentiation remains normal in the right hemisphere and posterior fossa. Basilar cisterns are patent. Vascular: Left MCA M1 through bifurcation stent redemonstrated. Skull: No acute osseous abnormality identified. Sinuses/Orbits: Visualized paranasal sinuses and mastoids are stable and well pneumatized. Other: Fluid in the nasopharynx, intubated on the scalp view. Visualized orbits and scalp soft tissues are within normal limits. IMPRESSION: 1. Evolving large left MCA infarct with expected appearance compared to 1830 hours yesterday. 2. No definite hemorrhagic conversion, and only mild intracranial mass effect at this time - trace midline shift. 3. Persistent high density in the subarachnoid spaces in and around the left Sylvian fissure more resembles contrast than blood. 4. No new intracranial abnormality. Electronically Signed   By: Odessa Fleming M.D.   On: 03/25/2020 14:41   CT HEAD WO CONTRAST  Result Date: 03/24/2020 CLINICAL DATA:  Follow-up acute left cerebral hemisphere infarct with intervention. EXAM: CT HEAD WITHOUT CONTRAST TECHNIQUE: Contiguous axial images were obtained from the base of the skull through the vertex without intravenous contrast. COMPARISON:  Earlier today. FINDINGS: Brain: Again demonstrated is ill-defined low density and subarachnoid hemorrhage in the left middle cerebral and left posterior cerebral artery distributions. A small, old curvilinear infarct along the lateral margin of the left caudate head is again noted. Normal size and position of the ventricles. No shift of midline structures. Vascular: Left middle cerebral artery stent. Skull: Normal. Negative for fracture or  focal lesion. Sinuses/Orbits: Unremarkable. Other: None. IMPRESSION: 1. Acute left middle cerebral and left posterior cerebral artery territory infarct without mass effect. 2. Stable subarachnoid hemorrhage on the left. 3. Stable left middle cerebral artery stent. 4. Stable small, old curvilinear infarct along the lateral margin of the left caudate head. Electronically Signed   By: Beckie Salts M.D.   On: 03/24/2020 18:45   CT Angio Neck W and/or Wo Contrast  Result Date: 03/24/2020 CLINICAL DATA:  Patient found down at 5 a.m. Right-sided weakness. Abnormal speech. Facial droop. EXAM: CT ANGIOGRAPHY HEAD AND NECK CT PERFUSION BRAIN TECHNIQUE: Multidetector CT imaging of the head and neck was performed using the standard protocol during bolus administration of intravenous contrast. Multiplanar CT image reconstructions and MIPs were obtained to evaluate the vascular anatomy. Carotid stenosis measurements (when applicable) are obtained utilizing NASCET criteria, using the distal internal carotid diameter as the denominator. Multiphase CT imaging of the brain was performed following IV bolus contrast injection. Subsequent parametric perfusion maps were calculated using RAPID software. CONTRAST:  OMNIPAQUE IOHEXOL 350 MG/ML SOLN COMPARISON:  CT head without contrast 03/24/2020 FINDINGS: CTA NECK FINDINGS Aortic arch: A 3 vessel arch configuration is present. Atherosclerotic calcifications are present at the aortic arch. No aneurysm or significant stenosis is present. Right carotid system: Right common carotid artery is within normal limits. Atherosclerotic changes are present at the bifurcation without significant stenosis. Study is mildly degraded by dental artifact. Left carotid system: The left common carotid artery is within normal limits. Atherosclerotic changes are present at the proximal left ICA without a significant stenosis relative to the more distal vessel. Vertebral arteries: The right vertebral  artery is  the dominant vessel. Both vertebral arteries originate from the subclavian arteries without significant stenosis. Is no significant stenosis of either vertebral artery in the neck. Skeleton: Degenerative changes are present cervical spine with slight reversal of normal cervical lordosis. No focal lytic or blastic lesions are present. Other neck: The soft tissues the neck are unremarkable. Upper chest: Mild dependent atelectasis is present. Thoracic inlet is normal. Review of the MIP images confirms the above findings CTA HEAD FINDINGS Anterior circulation: Internal carotid arteries are within normal limits through the ICA termini bilaterally. The A1 segments are normal bilaterally. The anterior communicating artery is patent. Right M1 segment is normal. Left M1 segment is occluded proximal to the bifurcation. Collateral vessels are evident within the sylvian fissure. Bilateral ACA and right MCA branch vessels are within normal limits. Posterior circulation: Internal carotid right vertebral artery is the dominant vessel. The non dominant left vertebral artery terminates at the PICA. Right vertebral artery becomes the basilar artery. Both posterior cerebral arteries originate from basilar tip. Prominent left posterior communicating artery is present. PCA branch vessels are within normal limits bilaterally. Venous sinuses: The dural sinuses are patent. The straight sinus deep cerebral veins are intact. Cortical veins are within normal limits. Anatomic variants: Prominent left posterior communicating artery. Review of the MIP images confirms the above findings CT Brain Perfusion Findings: ASPECTS: 10/10 CBF (<30%) Volume: 83mL Perfusion (Tmax>6.0s) volume: 82mL Mismatch Volume: 99mL Infarction Location:Left MCA IMPRESSION: 1. Large left MCA territory nonhemorrhagic infarct. 2. Acute left M1 large vessel occlusion. 3. Large penumbra with mismatch volume of 99 mL. 4. Collateral vessels are present within the  sylvian fissure. 5. Atherosclerotic changes at the carotid bifurcations bilaterally without significant stenosis. 6. Aortic Atherosclerosis (ICD10-I70.0). No significant stenosis at the arch. These results were called by telephone at the time of interpretation on 03/24/2020 at 6:13 am to provider [facility] , who verbally acknowledged these results. Electronically Signed   By: Marin Roberts M.D.   On: 03/24/2020 06:26   MR ANGIO HEAD WO CONTRAST  Result Date: 03/26/2020 CLINICAL DATA:  Follow-up examination for acute stroke. Previously identified left M1 occlusion, status post tPA and catheter directed revascularization with stent placement. EXAM: MRI HEAD WITHOUT CONTRAST MRA HEAD WITHOUT CONTRAST TECHNIQUE: Multiplanar, multiecho pulse sequences of the brain and surrounding structures were obtained without intravenous contrast. Angiographic images of the head were obtained using MRA technique without contrast. COMPARISON:  Prior head CT from earlier the same day as well as previous exams. FINDINGS: MRI HEAD FINDINGS Brain: Cerebral volume within normal limits for age. Minimal chronic microvascular ischemic disease noted within the supratentorial cerebral white matter. Large confluent area of restricted diffusion involving essentially the entirety of the left MCA distribution seen involving the left cerebral hemisphere. Associated gyral swelling and edema throughout the area of infarction. Associated regional mass effect with early left-to-right midline shift measuring up to 3 mm. Scattered T1 hyperintensity with extensive susceptibility artifact throughout the left sylvian fissure felt to be most consistent with subarachnoid hemorrhage. Additional scattered foci of subarachnoid blood seen scattered within the cortical sulci through the area of infarction, although some of this susceptibility signal likely reflects engorgement of the cortical veins. Small amount of subarachnoid blood also seen  along the suprasellar cistern and likely within the interpeduncular cistern. No frank hemorrhagic transformation or intraparenchymal hemorrhage. No other areas of acute or subacute infarct. Gray-white matter differentiation otherwise maintained. No other areas of chronic cortical infarction. No mass lesion or extra-axial fluid collection. No hydrocephalus  or ventricular trapping. Vascular: Vascular stent in place within the left M1 segment. Major intracranial vascular flow voids otherwise maintained. Skull and upper cervical spine: Craniocervical junction within normal limits. Bone marrow signal intensity normal. No scalp soft tissue abnormality. Sinuses/Orbits: Globes and orbital soft tissues within normal limits. Mild scattered mucosal thickening noted within the ethmoidal air cells. Paranasal sinuses are otherwise clear. Fluid seen layering within the nasopharynx with trace bilateral mastoid effusions. Patient is intubated. Other: None. MRA HEAD FINDINGS ANTERIOR CIRCULATION: Visualized distal cervical segments of the internal carotid arteries are patent with fairly symmetric antegrade flow. Petrous segments widely patent bilaterally. Cavernous and supraclinoid right ICA are widely patent. On the left, there is a focal 2-3 mm filling defect involving the proximal cavernous left ICA, suspicious for possible small amount of intraluminal thrombus (series 3, image 50). This is not seen on prior CTA. Associated moderate stenosis at this level. Cavernous and supraclinoid left ICA otherwise widely patent. ICA termini well perfused. A1 segments patent bilaterally. Normal anterior communicating artery. Anterior cerebral arteries are widely patent to their distal aspects. Right M1 widely patent. Normal right MCA bifurcation. Distal right MCA branches well perfused. On the left, vascular stent in place within the left M1 segment, extending across the left MCA bifurcation into proximal left M2 branch. No discernible flow  seen through the stent. No flow related signal seen distally within the left MCA branches, suggesting reocclusion. POSTERIOR CIRCULATION: Partially visualized right vertebral artery remains patent. 2 mm focal outpouching extending posteriorly from the vertebrobasilar junction noted, possibly a small aneurysm versus vascular infundibulum, stable (series 3, image 23). Hypoplastic left vertebral artery terminates in PICA, not well seen on this exam. Partially visualized picas appear patent. Basilar remains patent to its distal aspect without stenosis. Superior cerebral arteries patent bilaterally. Both PCAs primarily supplied via the basilar. PCAs remain well perfused to their distal aspects without stenosis. IMPRESSION: MRI HEAD IMPRESSION: 1. Continued interval evolution of large left MCA territory infarct. Associated cytotoxic edema with regional mass effect and 3 mm of left-to-right shift. 2. Scattered susceptibility artifact throughout the left sylvian fissure and left cerebral cortical sulci, most consistent with subarachnoid hemorrhage. Additional small volume subarachnoid blood also seen within the basilar cisterns. No frank intraparenchymal hemorrhage or hemorrhagic transformation. MRA HEAD IMPRESSION: 1. Vascular stent in place within the left M1 segment, with no discernible flow through the stent or distally within the left MCA distribution, consistent with reocclusion. 2. Focal 2-3 mm filling defect within the proximal cavernous left ICA as above, suspicious for a small amount of intraluminal thrombus. This is new from previous. 3. 2 mm focal outpouching extending posteriorly from the vertebrobasilar junction, which could reflect a small vascular infundibulum versus aneurysm. Electronically Signed   By: Rise MuBenjamin  McClintock M.D.   On: 03/26/2020 00:46   MR BRAIN WO CONTRAST  Result Date: 03/26/2020 CLINICAL DATA:  Follow-up examination for acute stroke. Previously identified left M1 occlusion, status  post tPA and catheter directed revascularization with stent placement. EXAM: MRI HEAD WITHOUT CONTRAST MRA HEAD WITHOUT CONTRAST TECHNIQUE: Multiplanar, multiecho pulse sequences of the brain and surrounding structures were obtained without intravenous contrast. Angiographic images of the head were obtained using MRA technique without contrast. COMPARISON:  Prior head CT from earlier the same day as well as previous exams. FINDINGS: MRI HEAD FINDINGS Brain: Cerebral volume within normal limits for age. Minimal chronic microvascular ischemic disease noted within the supratentorial cerebral white matter. Large confluent area of restricted diffusion involving essentially the entirety  of the left MCA distribution seen involving the left cerebral hemisphere. Associated gyral swelling and edema throughout the area of infarction. Associated regional mass effect with early left-to-right midline shift measuring up to 3 mm. Scattered T1 hyperintensity with extensive susceptibility artifact throughout the left sylvian fissure felt to be most consistent with subarachnoid hemorrhage. Additional scattered foci of subarachnoid blood seen scattered within the cortical sulci through the area of infarction, although some of this susceptibility signal likely reflects engorgement of the cortical veins. Small amount of subarachnoid blood also seen along the suprasellar cistern and likely within the interpeduncular cistern. No frank hemorrhagic transformation or intraparenchymal hemorrhage. No other areas of acute or subacute infarct. Gray-white matter differentiation otherwise maintained. No other areas of chronic cortical infarction. No mass lesion or extra-axial fluid collection. No hydrocephalus or ventricular trapping. Vascular: Vascular stent in place within the left M1 segment. Major intracranial vascular flow voids otherwise maintained. Skull and upper cervical spine: Craniocervical junction within normal limits. Bone marrow  signal intensity normal. No scalp soft tissue abnormality. Sinuses/Orbits: Globes and orbital soft tissues within normal limits. Mild scattered mucosal thickening noted within the ethmoidal air cells. Paranasal sinuses are otherwise clear. Fluid seen layering within the nasopharynx with trace bilateral mastoid effusions. Patient is intubated. Other: None. MRA HEAD FINDINGS ANTERIOR CIRCULATION: Visualized distal cervical segments of the internal carotid arteries are patent with fairly symmetric antegrade flow. Petrous segments widely patent bilaterally. Cavernous and supraclinoid right ICA are widely patent. On the left, there is a focal 2-3 mm filling defect involving the proximal cavernous left ICA, suspicious for possible small amount of intraluminal thrombus (series 3, image 50). This is not seen on prior CTA. Associated moderate stenosis at this level. Cavernous and supraclinoid left ICA otherwise widely patent. ICA termini well perfused. A1 segments patent bilaterally. Normal anterior communicating artery. Anterior cerebral arteries are widely patent to their distal aspects. Right M1 widely patent. Normal right MCA bifurcation. Distal right MCA branches well perfused. On the left, vascular stent in place within the left M1 segment, extending across the left MCA bifurcation into proximal left M2 branch. No discernible flow seen through the stent. No flow related signal seen distally within the left MCA branches, suggesting reocclusion. POSTERIOR CIRCULATION: Partially visualized right vertebral artery remains patent. 2 mm focal outpouching extending posteriorly from the vertebrobasilar junction noted, possibly a small aneurysm versus vascular infundibulum, stable (series 3, image 23). Hypoplastic left vertebral artery terminates in PICA, not well seen on this exam. Partially visualized picas appear patent. Basilar remains patent to its distal aspect without stenosis. Superior cerebral arteries patent  bilaterally. Both PCAs primarily supplied via the basilar. PCAs remain well perfused to their distal aspects without stenosis. IMPRESSION: MRI HEAD IMPRESSION: 1. Continued interval evolution of large left MCA territory infarct. Associated cytotoxic edema with regional mass effect and 3 mm of left-to-right shift. 2. Scattered susceptibility artifact throughout the left sylvian fissure and left cerebral cortical sulci, most consistent with subarachnoid hemorrhage. Additional small volume subarachnoid blood also seen within the basilar cisterns. No frank intraparenchymal hemorrhage or hemorrhagic transformation. MRA HEAD IMPRESSION: 1. Vascular stent in place within the left M1 segment, with no discernible flow through the stent or distally within the left MCA distribution, consistent with reocclusion. 2. Focal 2-3 mm filling defect within the proximal cavernous left ICA as above, suspicious for a small amount of intraluminal thrombus. This is new from previous. 3. 2 mm focal outpouching extending posteriorly from the vertebrobasilar junction, which could reflect a  small vascular infundibulum versus aneurysm. Electronically Signed   By: Rise Mu M.D.   On: 03/26/2020 00:46   IR Intra Cran Stent  Result Date: 03/26/2020 INDICATION: New onset right-sided facial droop and right-sided weakness with aphasia. Occluded left middle cerebral artery M1 segment on CT angiogram of the head and neck. EXAM: 1. EMERGENT LARGE VESSEL OCCLUSION THROMBOLYSIS (anterior CIRCULATION) COMPARISON:  CT angiogram of the head and neck of March 24, 2020. MEDICATIONS: Ancef 2 g IV antibiotic was administered within 1 hour of the procedure. ANESTHESIA/SEDATION: General anesthesia. CONTRAST:  Isovue 300 approximately 150 cc. FLUOROSCOPY TIME:  Fluoroscopy Time: 82 minutes 0 seconds (3115 mGy). COMPLICATIONS: None immediate. TECHNIQUE: Following a full explanation of the procedure along with the potential associated complications,  an informed witnessed consent was obtained. The risks of intracranial hemorrhage of 10%, worsening neurological deficit, ventilator dependency, death and inability to revascularize were all reviewed in detail with the patient's daughter. The patient was then put under general anesthesia by the Department of Anesthesiology at [facility]. The right groin was prepped and draped in the usual sterile fashion. Thereafter using modified Seldinger technique, transfemoral access into the right common femoral artery was obtained without difficulty. Over a 0.035 inch guidewire a 8 French 25 cm Pinnacle sheath was inserted. Through this, and also over a 0.035 inch guidewire an 087 balloon guide catheter with a 5 [place] Berenstein select combination was advanced to the aortic arch region and selectively positioned in the distal left common carotid artery. The guidewire was removed. Good aspiration was obtained from the hub of the balloon guide catheter following withdrawal of the select 5 French catheter. FINDINGS: A control arteriogram performed through the balloon guide catheter centered extra cranially and intracranially demonstrated the left external carotid artery and its major branches to be widely patent. The left internal carotid artery at the bulb to the cranial skull base demonstrates wide patency. The petrous, the cavernous and the supraclinoid segments are widely patent. The left posterior communicating artery is seen opacifying the left posterior cerebral distribution. Angiographic occlusion with moderate tapered narrowing noted of the left middle cerebral artery mid M1 segment. Mild narrowing of the left A1 segment of anterior cerebral artery is noted. More distally the left anterior cerebral artery opacifies into the capillary and venous phases. The delayed arterial phase demonstrates mild collateralization of the left MCA distribution from the pericallosal artery branches, and the P3 branches of the left  posterior cerebral artery. PROCEDURE: A combination of large-bore 130 cm 6 [place] support catheter with 021 Trevo ProVue microcatheter was advanced over a 0.014 inch standard Synchro micro guidewire with a J configuration through the balloon guide catheter to the horizontal petrous segment of the left middle cerebral artery. At this time the balloon guide catheter was advanced to the mid 1/3 of the cervical left ICA. With the micro guidewire leading, and using a torque device, access into the inferior division of the left middle cerebral artery was obtained with the micro guidewire followed by the microcatheter into the M2 M3 region. The guidewire was removed. Good aspiration obtained from the hub of the microcatheter. A gentle control arteriogram performed through the microcatheter demonstrated safe positioning of the tip of the microcatheter. This was then connected to continuous heparinized saline infusion. A Solitaire 4 mm x 40 mm X retrieval device was then advanced to the distal end of the microcatheter. The O ring on the delivery microcatheter was then loosened. With slight forward gentle traction with the  right hand on the delivery micro guidewire with the left hand the delivery microcatheter was retrieved unsheathing the distal and the proximal retrieval device. The large-bore catheter was advanced to the just prior proximal aspect of the inferior division. A control arteriogram performed through the large-bore catheter in the left internal carotid artery demonstrated revascularization in the left MCA distribution with the area of stenosis noted in the mid M1 segment, and also in the proximal inferior division of the left middle cerebral artery M3 region. Thereafter with proximal flow arrest in the left internal carotid artery with balloon dilatation, and constant aspiration with a 60 mL syringe at the hub of the balloon guide catheter, and a Penumbra aspiration device through the large-bore catheter over  2-1/2 minutes, the combination of the retrieval device, the micro catheter and the large-bore catheter were retrieved and removed as constant aspiration was applied at the hub of the balloon guide catheter. Following reversal of flow arrest, a control arteriogram performed through the balloon guide catheter in the left internal carotid artery demonstrated no relief of the obstructed left middle cerebral artery. A second pass was then made with the combination described above. At this time, following ascertained safe positioning of the tip of the microcatheter in the dominant inferior division M3 region, a 6 mm x 40 mm Solitaire X retrieval device was deployed. Again with proximal flow arrest, in the left internal carotid artery, and with constant aspiration at the hub of the large-bore catheter with Penumbra aspiration device, and a 60 mL syringe at the hub of the balloon guide catheter over approximately 2 minutes, the combination was again retrieved and removed. Following reversal of flow arrest, a control arteriogram performed through the balloon guide demonstrated now revascularization of the left middle cerebral artery proximally, and the inferior division. Tandem stenosis was noted at the mid left M1 segment, and also the inferior division proximally most consistent with arteriosclerotic disease. A repeat arteriogram performed approximately 3 minutes after revascularization demonstrated progressive reocclusion of the left middle cerebral artery. A third pass was then made again using the above combination. Again after having accessed the reoccluded left middle cerebral artery in the M3 inferior division and ascertaining safest position of the tip of the microcatheter, a 5 mm x 33 mm Embotrap retrieval device was then advanced and deployed as mentioned previously. With constant aspiration being applied at the hub of the large-bore catheter just inside the inferior division for 2-1/2 minutes and proximal flow  arrest, and aspiration at the hub of the balloon guide catheter, the combination of the retrieval device, and the microcatheter and the large-bore catheter were removed. Following reversal of flow arrest a control arteriogram performed through the left internal carotid artery again demonstrated only marginally improved flow through the left middle cerebral artery M1 segment with pitiful flow into the inferior division. A fourth pass made again using the above combination with a new microcatheter, and a new large-bore intermediary catheter was advanced over a 0.014 inch standard Synchro micro guidewire in the inferior division M3 region. The 5 mm x 33 mm Embotrap retrieval device was again deployed as mentioned previously. A control arteriogram performed following deployment of the retrieval device again demonstrated improved caliber and revascularization of the left MCA distribution achieving a TICI 2B revascularization. With proximal flow arrest and constant aspiration at the hub of the 6 French large-bore catheter with the Penumbra aspiration device and constant aspiration at the hub of balloon guide catheter with a 60 mL syringe over  2-1/2 minutes, the combination was retrieved and removed. Following reversal of the flow arrest, a control arteriogram performed through the balloon guide catheter in the left internal carotid artery demonstrated improved flow through the left M1 segment with modestly improved inferior division. There continued to be complete occlusion of the proximal inferior division. A fifth pass was then made using the combination of a 6 [place] Catalyst intermediary catheter with a combination of a Trevo ProVue microcatheter again positioned in the M3 inferior division. Again after having ascertained safe position of the tip of the microcatheter, a 6 mm x 40 mm Solitaire X retrieval device was advanced and deployed. Again with constant aspiration at the hub of the 6 [place] Catalyst guide catheter  now advanced just inside the inferior division, and a 60 mL syringe at the hub of the balloon guide catheter with proximal flow arrest, the combination was retrieved after approximately 2-1/2 minutes. Following removal of the combination and reversal of the flow arrest, a control arteriogram performed through the balloon guide catheter demonstrated modestly improved flow through the left MCA distribution with a TICI 2B revascularization of which continued to slowly occlude over the next few minutes. Three of the five aspiration attempts, only a few specks of a clot aperture was attained in the interstices of the retrieval device, and also the aspirate. It was felt that the recurring occlusion was secondary to the significant arteriosclerotic narrowing in the left M1 segment mid to distal third. At this time, an orogastric tube was inserted. A CT scan of the brain was obtained to evaluate for major intracranial bleed. This demonstrated mild subarachnoid hyperdensity in the left peri-insular and the left anterior parietal regions without evidence mass effect or midline shift. No intraparenchymal hemorrhage was seen. Following discussion with the referring neuro hospitalist, it was elected to proceed with placement of a rescue stent. The patient was given 325 mg of aspirin via the orogastric tube. Patient also given proximally 7.5 mg of Integrilin intra-arterially. Through the balloon guide catheter in the left internal carotid artery a combination of the 6 [place] Catalyst guide catheter with an 021 Trevo ProVue microcatheter was advanced over a 0.014 inch standard Synchro micro to the M3 region of the inferior division followed by the microcatheter. Again the guidewire was removed. Good aspiration obtained from the hub of the microcatheter which was then connected to continuous heparinized saline infusion. Measurements were made of the left middle cerebral artery M1 segment and also the inferior division from earlier  angiographic runs. It was decided to use a 4 mm x 24 mm Neuroform Atlas stent. This was then advanced in a coaxial manner and with constant heparinized saline infusion to the distal end of the microcatheter. The proximal and the distal landing zones were then defined. The O ring on the delivery microcatheter was loosened. The stent was then deployed without difficulty in the usual manner. The delivery micro guidewire was then retrieved and removed. The control arteriogram performed through the balloon guide catheter demonstrated excellent revascularization of the left MCA distribution proximally and in the trifurcation regions achieving a TICI 2C revascularization. There continued to be moderate stenosis at the M1 M2 junction on the AP projection but widely patent on the lateral projection. Also there was a moderate arteriosclerotic stenosis in the distal M3 segment. No evidence of extravasation, mass-effect or midline shift was noted. A repeat arteriogram performed 10 minutes later continued to demonstrate patency of the left MCA stented segment, and also of the left middle  cerebral artery. The left posterior communicating artery remained widely patent. The 6 [place] Catalyst guide catheter was retrieved and removed. A final control arteriogram performed through the balloon guide catheter in the left common carotid artery demonstrates patency of the left internal carotid artery extra cranially and intracranially with patency of the left MCA and the left anterior cerebral distributions. The balloon guide catheter was retrieved and removed. The 8 French Pinnacle sheath in the right groin was then removed with the successful placement of an 8 French Angio-Seal closure device with hemostasis. The right groin appeared soft. The left groin 4 French arterial sheath was left in place for close monitoring of the arterial pressures. Dopplerable in the dorsalis pedis, the posterior tibial regions bilaterally unchanged. A  second CT angiogram performed demonstrated slightly increased subarachnoid hyperdensity in the left peri-insular and the left anterior parietal regions probably representing a mixture of contrast, and blood. However, no mass-effect or midline shift or intraparenchymal hemorrhage was noted. The patient was given 60 mL of Brilinta via an orogastric tube in addition to the 325 mg of aspirin earlier. The patient was left intubated as per anesthesia to protect the airway, and also because of severe aphasia. The patient was then transferred to the ICU for post thrombectomy management. IMPRESSION: Status post endovascular revascularization of occluded left middle cerebral M1 segment due to underlying severe intracranial arteriosclerosis, with 1 pass with the 4 mm x 40 mm Solitaire X retrieval device, 2 passes with the 6 mm x 40 mm Embotrap retrieval device, and placement of a rescue stent in the left middle cerebral artery for persistent reocclusion, achieving a TICI 2C revascularization. PLAN: Follow-up in the clinic 4 weeks post discharge. Electronically Signed   By: Julieanne Cotton M.D.   On: 03/25/2020 10:20   IR CT Head Ltd  Result Date: 03/26/2020 INDICATION: New onset right-sided facial droop and right-sided weakness with aphasia. Occluded left middle cerebral artery M1 segment on CT angiogram of the head and neck. EXAM: 1. EMERGENT LARGE VESSEL OCCLUSION THROMBOLYSIS (anterior CIRCULATION) COMPARISON:  CT angiogram of the head and neck of March 24, 2020. MEDICATIONS: Ancef 2 g IV antibiotic was administered within 1 hour of the procedure. ANESTHESIA/SEDATION: General anesthesia. CONTRAST:  Isovue 300 approximately 150 cc. FLUOROSCOPY TIME:  Fluoroscopy Time: 82 minutes 0 seconds (3115 mGy). COMPLICATIONS: None immediate. TECHNIQUE: Following a full explanation of the procedure along with the potential associated complications, an informed witnessed consent was obtained. The risks of intracranial hemorrhage  of 10%, worsening neurological deficit, ventilator dependency, death and inability to revascularize were all reviewed in detail with the patient's daughter. The patient was then put under general anesthesia by the Department of Anesthesiology at [facility]. The right groin was prepped and draped in the usual sterile fashion. Thereafter using modified Seldinger technique, transfemoral access into the right common femoral artery was obtained without difficulty. Over a 0.035 inch guidewire a 8 French 25 cm Pinnacle sheath was inserted. Through this, and also over a 0.035 inch guidewire an 087 balloon guide catheter with a 5 [place] Berenstein select combination was advanced to the aortic arch region and selectively positioned in the distal left common carotid artery. The guidewire was removed. Good aspiration was obtained from the hub of the balloon guide catheter following withdrawal of the select 5 French catheter. FINDINGS: A control arteriogram performed through the balloon guide catheter centered extra cranially and intracranially demonstrated the left external carotid artery and its major branches to be widely patent. The  left internal carotid artery at the bulb to the cranial skull base demonstrates wide patency. The petrous, the cavernous and the supraclinoid segments are widely patent. The left posterior communicating artery is seen opacifying the left posterior cerebral distribution. Angiographic occlusion with moderate tapered narrowing noted of the left middle cerebral artery mid M1 segment. Mild narrowing of the left A1 segment of anterior cerebral artery is noted. More distally the left anterior cerebral artery opacifies into the capillary and venous phases. The delayed arterial phase demonstrates mild collateralization of the left MCA distribution from the pericallosal artery branches, and the P3 branches of the left posterior cerebral artery. PROCEDURE: A combination of large-bore 130 cm 6  [place] support catheter with 021 Trevo ProVue microcatheter was advanced over a 0.014 inch standard Synchro micro guidewire with a J configuration through the balloon guide catheter to the horizontal petrous segment of the left middle cerebral artery. At this time the balloon guide catheter was advanced to the mid 1/3 of the cervical left ICA. With the micro guidewire leading, and using a torque device, access into the inferior division of the left middle cerebral artery was obtained with the micro guidewire followed by the microcatheter into the M2 M3 region. The guidewire was removed. Good aspiration obtained from the hub of the microcatheter. A gentle control arteriogram performed through the microcatheter demonstrated safe positioning of the tip of the microcatheter. This was then connected to continuous heparinized saline infusion. A Solitaire 4 mm x 40 mm X retrieval device was then advanced to the distal end of the microcatheter. The O ring on the delivery microcatheter was then loosened. With slight forward gentle traction with the right hand on the delivery micro guidewire with the left hand the delivery microcatheter was retrieved unsheathing the distal and the proximal retrieval device. The large-bore catheter was advanced to the just prior proximal aspect of the inferior division. A control arteriogram performed through the large-bore catheter in the left internal carotid artery demonstrated revascularization in the left MCA distribution with the area of stenosis noted in the mid M1 segment, and also in the proximal inferior division of the left middle cerebral artery M3 region. Thereafter with proximal flow arrest in the left internal carotid artery with balloon dilatation, and constant aspiration with a 60 mL syringe at the hub of the balloon guide catheter, and a Penumbra aspiration device through the large-bore catheter over 2-1/2 minutes, the combination of the retrieval device, the micro catheter  and the large-bore catheter were retrieved and removed as constant aspiration was applied at the hub of the balloon guide catheter. Following reversal of flow arrest, a control arteriogram performed through the balloon guide catheter in the left internal carotid artery demonstrated no relief of the obstructed left middle cerebral artery. A second pass was then made with the combination described above. At this time, following ascertained safe positioning of the tip of the microcatheter in the dominant inferior division M3 region, a 6 mm x 40 mm Solitaire X retrieval device was deployed. Again with proximal flow arrest, in the left internal carotid artery, and with constant aspiration at the hub of the large-bore catheter with Penumbra aspiration device, and a 60 mL syringe at the hub of the balloon guide catheter over approximately 2 minutes, the combination was again retrieved and removed. Following reversal of flow arrest, a control arteriogram performed through the balloon guide demonstrated now revascularization of the left middle cerebral artery proximally, and the inferior division. Tandem stenosis was noted at  the mid left M1 segment, and also the inferior division proximally most consistent with arteriosclerotic disease. A repeat arteriogram performed approximately 3 minutes after revascularization demonstrated progressive reocclusion of the left middle cerebral artery. A third pass was then made again using the above combination. Again after having accessed the reoccluded left middle cerebral artery in the M3 inferior division and ascertaining safest position of the tip of the microcatheter, a 5 mm x 33 mm Embotrap retrieval device was then advanced and deployed as mentioned previously. With constant aspiration being applied at the hub of the large-bore catheter just inside the inferior division for 2-1/2 minutes and proximal flow arrest, and aspiration at the hub of the balloon guide catheter, the  combination of the retrieval device, and the microcatheter and the large-bore catheter were removed. Following reversal of flow arrest a control arteriogram performed through the left internal carotid artery again demonstrated only marginally improved flow through the left middle cerebral artery M1 segment with pitiful flow into the inferior division. A fourth pass made again using the above combination with a new microcatheter, and a new large-bore intermediary catheter was advanced over a 0.014 inch standard Synchro micro guidewire in the inferior division M3 region. The 5 mm x 33 mm Embotrap retrieval device was again deployed as mentioned previously. A control arteriogram performed following deployment of the retrieval device again demonstrated improved caliber and revascularization of the left MCA distribution achieving a TICI 2B revascularization. With proximal flow arrest and constant aspiration at the hub of the 6 French large-bore catheter with the Penumbra aspiration device and constant aspiration at the hub of balloon guide catheter with a 60 mL syringe over 2-1/2 minutes, the combination was retrieved and removed. Following reversal of the flow arrest, a control arteriogram performed through the balloon guide catheter in the left internal carotid artery demonstrated improved flow through the left M1 segment with modestly improved inferior division. There continued to be complete occlusion of the proximal inferior division. A fifth pass was then made using the combination of a 6 [place] Catalyst intermediary catheter with a combination of a Trevo ProVue microcatheter again positioned in the M3 inferior division. Again after having ascertained safe position of the tip of the microcatheter, a 6 mm x 40 mm Solitaire X retrieval device was advanced and deployed. Again with constant aspiration at the hub of the 6 [place] Catalyst guide catheter now advanced just inside the inferior division, and a 60 mL syringe  at the hub of the balloon guide catheter with proximal flow arrest, the combination was retrieved after approximately 2-1/2 minutes. Following removal of the combination and reversal of the flow arrest, a control arteriogram performed through the balloon guide catheter demonstrated modestly improved flow through the left MCA distribution with a TICI 2B revascularization of which continued to slowly occlude over the next few minutes. Three of the five aspiration attempts, only a few specks of a clot aperture was attained in the interstices of the retrieval device, and also the aspirate. It was felt that the recurring occlusion was secondary to the significant arteriosclerotic narrowing in the left M1 segment mid to distal third. At this time, an orogastric tube was inserted. A CT scan of the brain was obtained to evaluate for major intracranial bleed. This demonstrated mild subarachnoid hyperdensity in the left peri-insular and the left anterior parietal regions without evidence mass effect or midline shift. No intraparenchymal hemorrhage was seen. Following discussion with the referring neuro hospitalist, it was elected to proceed with  placement of a rescue stent. The patient was given 325 mg of aspirin via the orogastric tube. Patient also given proximally 7.5 mg of Integrilin intra-arterially. Through the balloon guide catheter in the left internal carotid artery a combination of the 6 [place] Catalyst guide catheter with an 021 Trevo ProVue microcatheter was advanced over a 0.014 inch standard Synchro micro to the M3 region of the inferior division followed by the microcatheter. Again the guidewire was removed. Good aspiration obtained from the hub of the microcatheter which was then connected to continuous heparinized saline infusion. Measurements were made of the left middle cerebral artery M1 segment and also the inferior division from earlier angiographic runs. It was decided to use a 4 mm x 24 mm Neuroform  Atlas stent. This was then advanced in a coaxial manner and with constant heparinized saline infusion to the distal end of the microcatheter. The proximal and the distal landing zones were then defined. The O ring on the delivery microcatheter was loosened. The stent was then deployed without difficulty in the usual manner. The delivery micro guidewire was then retrieved and removed. The control arteriogram performed through the balloon guide catheter demonstrated excellent revascularization of the left MCA distribution proximally and in the trifurcation regions achieving a TICI 2C revascularization. There continued to be moderate stenosis at the M1 M2 junction on the AP projection but widely patent on the lateral projection. Also there was a moderate arteriosclerotic stenosis in the distal M3 segment. No evidence of extravasation, mass-effect or midline shift was noted. A repeat arteriogram performed 10 minutes later continued to demonstrate patency of the left MCA stented segment, and also of the left middle cerebral artery. The left posterior communicating artery remained widely patent. The 6 [place] Catalyst guide catheter was retrieved and removed. A final control arteriogram performed through the balloon guide catheter in the left common carotid artery demonstrates patency of the left internal carotid artery extra cranially and intracranially with patency of the left MCA and the left anterior cerebral distributions. The balloon guide catheter was retrieved and removed. The 8 French Pinnacle sheath in the right groin was then removed with the successful placement of an 8 French Angio-Seal closure device with hemostasis. The right groin appeared soft. The left groin 4 French arterial sheath was left in place for close monitoring of the arterial pressures. Dopplerable in the dorsalis pedis, the posterior tibial regions bilaterally unchanged. A second CT angiogram performed demonstrated slightly increased  subarachnoid hyperdensity in the left peri-insular and the left anterior parietal regions probably representing a mixture of contrast, and blood. However, no mass-effect or midline shift or intraparenchymal hemorrhage was noted. The patient was given 60 mL of Brilinta via an orogastric tube in addition to the 325 mg of aspirin earlier. The patient was left intubated as per anesthesia to protect the airway, and also because of severe aphasia. The patient was then transferred to the ICU for post thrombectomy management. IMPRESSION: Status post endovascular revascularization of occluded left middle cerebral M1 segment due to underlying severe intracranial arteriosclerosis, with 1 pass with the 4 mm x 40 mm Solitaire X retrieval device, 2 passes with the 6 mm x 40 mm Embotrap retrieval device, and placement of a rescue stent in the left middle cerebral artery for persistent reocclusion, achieving a TICI 2C revascularization. PLAN: Follow-up in the clinic 4 weeks post discharge. Electronically Signed   By: Julieanne Cotton M.D.   On: 03/25/2020 10:20   IR CT Head Ltd  Result Date:  03/26/2020 INDICATION: New onset right-sided facial droop and right-sided weakness with aphasia. Occluded left middle cerebral artery M1 segment on CT angiogram of the head and neck. EXAM: 1. EMERGENT LARGE VESSEL OCCLUSION THROMBOLYSIS (anterior CIRCULATION) COMPARISON:  CT angiogram of the head and neck of March 24, 2020. MEDICATIONS: Ancef 2 g IV antibiotic was administered within 1 hour of the procedure. ANESTHESIA/SEDATION: General anesthesia. CONTRAST:  Isovue 300 approximately 150 cc. FLUOROSCOPY TIME:  Fluoroscopy Time: 82 minutes 0 seconds (3115 mGy). COMPLICATIONS: None immediate. TECHNIQUE: Following a full explanation of the procedure along with the potential associated complications, an informed witnessed consent was obtained. The risks of intracranial hemorrhage of 10%, worsening neurological deficit, ventilator dependency,  death and inability to revascularize were all reviewed in detail with the patient's daughter. The patient was then put under general anesthesia by the Department of Anesthesiology at [facility]. The right groin was prepped and draped in the usual sterile fashion. Thereafter using modified Seldinger technique, transfemoral access into the right common femoral artery was obtained without difficulty. Over a 0.035 inch guidewire a 8 French 25 cm Pinnacle sheath was inserted. Through this, and also over a 0.035 inch guidewire an 087 balloon guide catheter with a 5 [place] Berenstein select combination was advanced to the aortic arch region and selectively positioned in the distal left common carotid artery. The guidewire was removed. Good aspiration was obtained from the hub of the balloon guide catheter following withdrawal of the select 5 French catheter. FINDINGS: A control arteriogram performed through the balloon guide catheter centered extra cranially and intracranially demonstrated the left external carotid artery and its major branches to be widely patent. The left internal carotid artery at the bulb to the cranial skull base demonstrates wide patency. The petrous, the cavernous and the supraclinoid segments are widely patent. The left posterior communicating artery is seen opacifying the left posterior cerebral distribution. Angiographic occlusion with moderate tapered narrowing noted of the left middle cerebral artery mid M1 segment. Mild narrowing of the left A1 segment of anterior cerebral artery is noted. More distally the left anterior cerebral artery opacifies into the capillary and venous phases. The delayed arterial phase demonstrates mild collateralization of the left MCA distribution from the pericallosal artery branches, and the P3 branches of the left posterior cerebral artery. PROCEDURE: A combination of large-bore 130 cm 6 [place] support catheter with 021 Trevo ProVue microcatheter was  advanced over a 0.014 inch standard Synchro micro guidewire with a J configuration through the balloon guide catheter to the horizontal petrous segment of the left middle cerebral artery. At this time the balloon guide catheter was advanced to the mid 1/3 of the cervical left ICA. With the micro guidewire leading, and using a torque device, access into the inferior division of the left middle cerebral artery was obtained with the micro guidewire followed by the microcatheter into the M2 M3 region. The guidewire was removed. Good aspiration obtained from the hub of the microcatheter. A gentle control arteriogram performed through the microcatheter demonstrated safe positioning of the tip of the microcatheter. This was then connected to continuous heparinized saline infusion. A Solitaire 4 mm x 40 mm X retrieval device was then advanced to the distal end of the microcatheter. The O ring on the delivery microcatheter was then loosened. With slight forward gentle traction with the right hand on the delivery micro guidewire with the left hand the delivery microcatheter was retrieved unsheathing the distal and the proximal retrieval device. The large-bore catheter was  advanced to the just prior proximal aspect of the inferior division. A control arteriogram performed through the large-bore catheter in the left internal carotid artery demonstrated revascularization in the left MCA distribution with the area of stenosis noted in the mid M1 segment, and also in the proximal inferior division of the left middle cerebral artery M3 region. Thereafter with proximal flow arrest in the left internal carotid artery with balloon dilatation, and constant aspiration with a 60 mL syringe at the hub of the balloon guide catheter, and a Penumbra aspiration device through the large-bore catheter over 2-1/2 minutes, the combination of the retrieval device, the micro catheter and the large-bore catheter were retrieved and removed as  constant aspiration was applied at the hub of the balloon guide catheter. Following reversal of flow arrest, a control arteriogram performed through the balloon guide catheter in the left internal carotid artery demonstrated no relief of the obstructed left middle cerebral artery. A second pass was then made with the combination described above. At this time, following ascertained safe positioning of the tip of the microcatheter in the dominant inferior division M3 region, a 6 mm x 40 mm Solitaire X retrieval device was deployed. Again with proximal flow arrest, in the left internal carotid artery, and with constant aspiration at the hub of the large-bore catheter with Penumbra aspiration device, and a 60 mL syringe at the hub of the balloon guide catheter over approximately 2 minutes, the combination was again retrieved and removed. Following reversal of flow arrest, a control arteriogram performed through the balloon guide demonstrated now revascularization of the left middle cerebral artery proximally, and the inferior division. Tandem stenosis was noted at the mid left M1 segment, and also the inferior division proximally most consistent with arteriosclerotic disease. A repeat arteriogram performed approximately 3 minutes after revascularization demonstrated progressive reocclusion of the left middle cerebral artery. A third pass was then made again using the above combination. Again after having accessed the reoccluded left middle cerebral artery in the M3 inferior division and ascertaining safest position of the tip of the microcatheter, a 5 mm x 33 mm Embotrap retrieval device was then advanced and deployed as mentioned previously. With constant aspiration being applied at the hub of the large-bore catheter just inside the inferior division for 2-1/2 minutes and proximal flow arrest, and aspiration at the hub of the balloon guide catheter, the combination of the retrieval device, and the microcatheter and the  large-bore catheter were removed. Following reversal of flow arrest a control arteriogram performed through the left internal carotid artery again demonstrated only marginally improved flow through the left middle cerebral artery M1 segment with pitiful flow into the inferior division. A fourth pass made again using the above combination with a new microcatheter, and a new large-bore intermediary catheter was advanced over a 0.014 inch standard Synchro micro guidewire in the inferior division M3 region. The 5 mm x 33 mm Embotrap retrieval device was again deployed as mentioned previously. A control arteriogram performed following deployment of the retrieval device again demonstrated improved caliber and revascularization of the left MCA distribution achieving a TICI 2B revascularization. With proximal flow arrest and constant aspiration at the hub of the 6 French large-bore catheter with the Penumbra aspiration device and constant aspiration at the hub of balloon guide catheter with a 60 mL syringe over 2-1/2 minutes, the combination was retrieved and removed. Following reversal of the flow arrest, a control arteriogram performed through the balloon guide catheter in the left internal carotid  artery demonstrated improved flow through the left M1 segment with modestly improved inferior division. There continued to be complete occlusion of the proximal inferior division. A fifth pass was then made using the combination of a 6 [place] Catalyst intermediary catheter with a combination of a Trevo ProVue microcatheter again positioned in the M3 inferior division. Again after having ascertained safe position of the tip of the microcatheter, a 6 mm x 40 mm Solitaire X retrieval device was advanced and deployed. Again with constant aspiration at the hub of the 6 [place] Catalyst guide catheter now advanced just inside the inferior division, and a 60 mL syringe at the hub of the balloon guide catheter with proximal flow arrest,  the combination was retrieved after approximately 2-1/2 minutes. Following removal of the combination and reversal of the flow arrest, a control arteriogram performed through the balloon guide catheter demonstrated modestly improved flow through the left MCA distribution with a TICI 2B revascularization of which continued to slowly occlude over the next few minutes. Three of the five aspiration attempts, only a few specks of a clot aperture was attained in the interstices of the retrieval device, and also the aspirate. It was felt that the recurring occlusion was secondary to the significant arteriosclerotic narrowing in the left M1 segment mid to distal third. At this time, an orogastric tube was inserted. A CT scan of the brain was obtained to evaluate for major intracranial bleed. This demonstrated mild subarachnoid hyperdensity in the left peri-insular and the left anterior parietal regions without evidence mass effect or midline shift. No intraparenchymal hemorrhage was seen. Following discussion with the referring neuro hospitalist, it was elected to proceed with placement of a rescue stent. The patient was given 325 mg of aspirin via the orogastric tube. Patient also given proximally 7.5 mg of Integrilin intra-arterially. Through the balloon guide catheter in the left internal carotid artery a combination of the 6 [place] Catalyst guide catheter with an 021 Trevo ProVue microcatheter was advanced over a 0.014 inch standard Synchro micro to the M3 region of the inferior division followed by the microcatheter. Again the guidewire was removed. Good aspiration obtained from the hub of the microcatheter which was then connected to continuous heparinized saline infusion. Measurements were made of the left middle cerebral artery M1 segment and also the inferior division from earlier angiographic runs. It was decided to use a 4 mm x 24 mm Neuroform Atlas stent. This was then advanced in a coaxial manner and with  constant heparinized saline infusion to the distal end of the microcatheter. The proximal and the distal landing zones were then defined. The O ring on the delivery microcatheter was loosened. The stent was then deployed without difficulty in the usual manner. The delivery micro guidewire was then retrieved and removed. The control arteriogram performed through the balloon guide catheter demonstrated excellent revascularization of the left MCA distribution proximally and in the trifurcation regions achieving a TICI 2C revascularization. There continued to be moderate stenosis at the M1 M2 junction on the AP projection but widely patent on the lateral projection. Also there was a moderate arteriosclerotic stenosis in the distal M3 segment. No evidence of extravasation, mass-effect or midline shift was noted. A repeat arteriogram performed 10 minutes later continued to demonstrate patency of the left MCA stented segment, and also of the left middle cerebral artery. The left posterior communicating artery remained widely patent. The 6 [place] Catalyst guide catheter was retrieved and removed. A final control arteriogram performed through the balloon  guide catheter in the left common carotid artery demonstrates patency of the left internal carotid artery extra cranially and intracranially with patency of the left MCA and the left anterior cerebral distributions. The balloon guide catheter was retrieved and removed. The 8 French Pinnacle sheath in the right groin was then removed with the successful placement of an 8 French Angio-Seal closure device with hemostasis. The right groin appeared soft. The left groin 4 French arterial sheath was left in place for close monitoring of the arterial pressures. Dopplerable in the dorsalis pedis, the posterior tibial regions bilaterally unchanged. A second CT angiogram performed demonstrated slightly increased subarachnoid hyperdensity in the left peri-insular and the left anterior  parietal regions probably representing a mixture of contrast, and blood. However, no mass-effect or midline shift or intraparenchymal hemorrhage was noted. The patient was given 60 mL of Brilinta via an orogastric tube in addition to the 325 mg of aspirin earlier. The patient was left intubated as per anesthesia to protect the airway, and also because of severe aphasia. The patient was then transferred to the ICU for post thrombectomy management. IMPRESSION: Status post endovascular revascularization of occluded left middle cerebral M1 segment due to underlying severe intracranial arteriosclerosis, with 1 pass with the 4 mm x 40 mm Solitaire X retrieval device, 2 passes with the 6 mm x 40 mm Embotrap retrieval device, and placement of a rescue stent in the left middle cerebral artery for persistent reocclusion, achieving a TICI 2C revascularization. PLAN: Follow-up in the clinic 4 weeks post discharge. Electronically Signed   By: Julieanne Cotton M.D.   On: 03/25/2020 10:20   CT CEREBRAL PERFUSION W CONTRAST  Result Date: 03/24/2020 CLINICAL DATA:  Patient found down at 5 a.m. Right-sided weakness. Abnormal speech. Facial droop. EXAM: CT ANGIOGRAPHY HEAD AND NECK CT PERFUSION BRAIN TECHNIQUE: Multidetector CT imaging of the head and neck was performed using the standard protocol during bolus administration of intravenous contrast. Multiplanar CT image reconstructions and MIPs were obtained to evaluate the vascular anatomy. Carotid stenosis measurements (when applicable) are obtained utilizing NASCET criteria, using the distal internal carotid diameter as the denominator. Multiphase CT imaging of the brain was performed following IV bolus contrast injection. Subsequent parametric perfusion maps were calculated using RAPID software. CONTRAST:  OMNIPAQUE IOHEXOL 350 MG/ML SOLN COMPARISON:  CT head without contrast 03/24/2020 FINDINGS: CTA NECK FINDINGS Aortic arch: A 3 vessel arch configuration is present.  Atherosclerotic calcifications are present at the aortic arch. No aneurysm or significant stenosis is present. Right carotid system: Right common carotid artery is within normal limits. Atherosclerotic changes are present at the bifurcation without significant stenosis. Study is mildly degraded by dental artifact. Left carotid system: The left common carotid artery is within normal limits. Atherosclerotic changes are present at the proximal left ICA without a significant stenosis relative to the more distal vessel. Vertebral arteries: The right vertebral artery is the dominant vessel. Both vertebral arteries originate from the subclavian arteries without significant stenosis. Is no significant stenosis of either vertebral artery in the neck. Skeleton: Degenerative changes are present cervical spine with slight reversal of normal cervical lordosis. No focal lytic or blastic lesions are present. Other neck: The soft tissues the neck are unremarkable. Upper chest: Mild dependent atelectasis is present. Thoracic inlet is normal. Review of the MIP images confirms the above findings CTA HEAD FINDINGS Anterior circulation: Internal carotid arteries are within normal limits through the ICA termini bilaterally. The A1 segments are normal bilaterally. The anterior communicating artery  is patent. Right M1 segment is normal. Left M1 segment is occluded proximal to the bifurcation. Collateral vessels are evident within the sylvian fissure. Bilateral ACA and right MCA branch vessels are within normal limits. Posterior circulation: Internal carotid right vertebral artery is the dominant vessel. The non dominant left vertebral artery terminates at the PICA. Right vertebral artery becomes the basilar artery. Both posterior cerebral arteries originate from basilar tip. Prominent left posterior communicating artery is present. PCA branch vessels are within normal limits bilaterally. Venous sinuses: The dural sinuses are patent. The  straight sinus deep cerebral veins are intact. Cortical veins are within normal limits. Anatomic variants: Prominent left posterior communicating artery. Review of the MIP images confirms the above findings CT Brain Perfusion Findings: ASPECTS: 10/10 CBF (<30%) Volume: 83mL Perfusion (Tmax>6.0s) volume: 82mL Mismatch Volume: 99mL Infarction Location:Left MCA IMPRESSION: 1. Large left MCA territory nonhemorrhagic infarct. 2. Acute left M1 large vessel occlusion. 3. Large penumbra with mismatch volume of 99 mL. 4. Collateral vessels are present within the sylvian fissure. 5. Atherosclerotic changes at the carotid bifurcations bilaterally without significant stenosis. 6. Aortic Atherosclerosis (ICD10-I70.0). No significant stenosis at the arch. These results were called by telephone at the time of interpretation on 03/24/2020 at 6:13 am to provider [facility] , who verbally acknowledged these results. Electronically Signed   By: Marin Roberts M.D.   On: 03/24/2020 06:26   Portable Chest xray  Result Date: 03/25/2020 CLINICAL DATA:  Respiratory failure. Left MCA infarct. EXAM: PORTABLE CHEST 1 VIEW COMPARISON:  One-view chest x-ray 06/13/2008 FINDINGS: Heart size is normal. Lung volumes are low. The patient is intubated. Endotracheal tube terminates 4 cm above the carina. NG tube courses off the inferior border the film. IMPRESSION: 1. Endotracheal tube terminates 4 cm above the carina. 2. Low lung volumes. Electronically Signed   By: Marin Roberts M.D.   On: 03/25/2020 06:05   IR PATIENT EVAL TECH 0-60 MINS  Result Date: 03/25/2020 Carlyon Prows     03/25/2020 10:42 AM Left 4 french sheath removal:  No hematoma and pulses present prior to sheath removal.  Sheath removed at 1003 and held manual pressure for 15 minutes. No complications, pulses present and groin site level 0.  Verified groin site with [facility]. Lynann Beaver RT VI, R  ECHOCARDIOGRAM COMPLETE  Result Date: 03/24/2020     ECHOCARDIOGRAM REPORT   Patient Name:   HILERY WINTLE Date of Exam: 03/24/2020 Medical Rec #:  [id number]        Height:       65.0 in Accession #:    [id number]       Weight:       189.4 lb Date of Birth:  04-17-1961       BSA:          1.933 m Patient Age:    58 years         BP:           119/53 mmHg Patient Gender: M                HR:           56 bpm. Exam Location:  Inpatient Procedure: 2D Echo, Cardiac Doppler and Color Doppler Indications:    Stroke 434.91 / I163.9  History:        Patient has no prior history of Echocardiogram examinations.                 Risk Factors:Diabetes.  Sonographer:  Elmarie Shiley Dance Referring Phys: [id number] ASHISH ARORA IMPRESSIONS  1. Normal LV systolic function; grade 1 diastolic dysfunction.  2. Left ventricular ejection fraction, by estimation, is 55 to 60%. The left ventricle has normal function. The left ventricle has no regional wall motion abnormalities. Left ventricular diastolic parameters are consistent with Grade I diastolic dysfunction (impaired relaxation).  3. Right ventricular systolic function is normal. The right ventricular size is normal.  4. The mitral valve is normal in structure. No evidence of mitral valve regurgitation. No evidence of mitral stenosis.  5. The aortic valve is tricuspid. Aortic valve regurgitation is not visualized. Mild aortic valve sclerosis is present, with no evidence of aortic valve stenosis. FINDINGS  Left Ventricle: Left ventricular ejection fraction, by estimation, is 55 to 60%. The left ventricle has normal function. The left ventricle has no regional wall motion abnormalities. The left ventricular internal cavity size was normal in size. There is  no left ventricular hypertrophy. Left ventricular diastolic parameters are consistent with Grade I diastolic dysfunction (impaired relaxation). Right Ventricle: The right ventricular size is normal. Right ventricular systolic function is normal. Left Atrium: Left atrial size was normal  in size. Right Atrium: Right atrial size was normal in size. Pericardium: There is no evidence of pericardial effusion. Mitral Valve: The mitral valve is normal in structure. Normal mobility of the mitral valve leaflets. No evidence of mitral valve regurgitation. No evidence of mitral valve stenosis. Tricuspid Valve: The tricuspid valve is normal in structure. Tricuspid valve regurgitation is not demonstrated. No evidence of tricuspid stenosis. Aortic Valve: The aortic valve is tricuspid. Aortic valve regurgitation is not visualized. Mild aortic valve sclerosis is present, with no evidence of aortic valve stenosis. Pulmonic Valve: The pulmonic valve was normal in structure. Pulmonic valve regurgitation is not visualized. No evidence of pulmonic stenosis. Aorta: The aortic root is normal in size and structure. Venous: The inferior vena cava was not well visualized.  Additional Comments: Normal LV systolic function; grade 1 diastolic dysfunction.  LEFT VENTRICLE PLAX 2D LVIDd:         3.09 cm  Diastology LVIDs:         2.86 cm  LV e' lateral:   4.57 cm/s LV PW:         0.99 cm  LV E/e' lateral: 9.2 LV IVS:        0.94 cm  LV e' medial:    4.90 cm/s LVOT diam:     2.10 cm  LV E/e' medial:  8.6 LV SV:         62 LV SV Index:   32 LVOT Area:     3.46 cm  RIGHT VENTRICLE             IVC RV Basal diam:  2.12 cm     IVC diam: 2.18 cm RV S prime:     10.20 cm/s TAPSE (M-mode): 2.1 cm LEFT ATRIUM             Index       RIGHT ATRIUM           Index LA diam:        3.10 cm 1.60 cm/m  RA Area:     10.60 cm LA Vol (A2C):   35.2 ml 18.21 ml/m RA Volume:   17.40 ml  9.00 ml/m LA Vol (A4C):   35.0 ml 18.11 ml/m LA Biplane Vol: 35.2 ml 18.21 ml/m  AORTIC VALVE LVOT Vmax:   79.10 cm/s LVOT Vmean:  47.100 cm/s LVOT VTI:    0.180 m  AORTA Ao Root diam: 3.50 cm Ao Asc diam:  3.45 cm MITRAL VALVE MV Area (PHT): 1.78 cm    SHUNTS MV Decel Time: 426 msec    Systemic VTI:  0.18 m MV E velocity: 42.20 cm/s  Systemic Diam: 2.10 cm MV  A velocity: 45.60 cm/s MV E/A ratio:  0.93 Olga Millers MD Electronically signed by Olga Millers MD Signature Date/Time: 03/24/2020/2:36:05 PM    Final    IR PERCUTANEOUS ART THROMBECTOMY/INFUSION INTRACRANIAL INC DIAG ANGIO  Result Date: 03/26/2020 INDICATION: New onset right-sided facial droop and right-sided weakness with aphasia. Occluded left middle cerebral artery M1 segment on CT angiogram of the head and neck. EXAM: 1. EMERGENT LARGE VESSEL OCCLUSION THROMBOLYSIS (anterior CIRCULATION) COMPARISON:  CT angiogram of the head and neck of March 24, 2020. MEDICATIONS: Ancef 2 g IV antibiotic was administered within 1 hour of the procedure. ANESTHESIA/SEDATION: General anesthesia. CONTRAST:  Isovue 300 approximately 150 cc. FLUOROSCOPY TIME:  Fluoroscopy Time: 82 minutes 0 seconds (3115 mGy). COMPLICATIONS: None immediate. TECHNIQUE: Following a full explanation of the procedure along with the potential associated complications, an informed witnessed consent was obtained. The risks of intracranial hemorrhage of 10%, worsening neurological deficit, ventilator dependency, death and inability to revascularize were all reviewed in detail with the patient's daughter. The patient was then put under general anesthesia by the Department of Anesthesiology at [facility]. The right groin was prepped and draped in the usual sterile fashion. Thereafter using modified Seldinger technique, transfemoral access into the right common femoral artery was obtained without difficulty. Over a 0.035 inch guidewire a 8 French 25 cm Pinnacle sheath was inserted. Through this, and also over a 0.035 inch guidewire an 087 balloon guide catheter with a 5 [place] Berenstein select combination was advanced to the aortic arch region and selectively positioned in the distal left common carotid artery. The guidewire was removed. Good aspiration was obtained from the hub of the balloon guide catheter following withdrawal of the select  5 French catheter. FINDINGS: A control arteriogram performed through the balloon guide catheter centered extra cranially and intracranially demonstrated the left external carotid artery and its major branches to be widely patent. The left internal carotid artery at the bulb to the cranial skull base demonstrates wide patency. The petrous, the cavernous and the supraclinoid segments are widely patent. The left posterior communicating artery is seen opacifying the left posterior cerebral distribution. Angiographic occlusion with moderate tapered narrowing noted of the left middle cerebral artery mid M1 segment. Mild narrowing of the left A1 segment of anterior cerebral artery is noted. More distally the left anterior cerebral artery opacifies into the capillary and venous phases. The delayed arterial phase demonstrates mild collateralization of the left MCA distribution from the pericallosal artery branches, and the P3 branches of the left posterior cerebral artery. PROCEDURE: A combination of large-bore 130 cm 6 [place] support catheter with 021 Trevo ProVue microcatheter was advanced over a 0.014 inch standard Synchro micro guidewire with a J configuration through the balloon guide catheter to the horizontal petrous segment of the left middle cerebral artery. At this time the balloon guide catheter was advanced to the mid 1/3 of the cervical left ICA. With the micro guidewire leading, and using a torque device, access into the inferior division of the left middle cerebral artery was obtained with the micro guidewire followed by the microcatheter into the M2 M3 region. The guidewire was removed. Good aspiration obtained  from the hub of the microcatheter. A gentle control arteriogram performed through the microcatheter demonstrated safe positioning of the tip of the microcatheter. This was then connected to continuous heparinized saline infusion. A Solitaire 4 mm x 40 mm X retrieval device was then advanced to the  distal end of the microcatheter. The O ring on the delivery microcatheter was then loosened. With slight forward gentle traction with the right hand on the delivery micro guidewire with the left hand the delivery microcatheter was retrieved unsheathing the distal and the proximal retrieval device. The large-bore catheter was advanced to the just prior proximal aspect of the inferior division. A control arteriogram performed through the large-bore catheter in the left internal carotid artery demonstrated revascularization in the left MCA distribution with the area of stenosis noted in the mid M1 segment, and also in the proximal inferior division of the left middle cerebral artery M3 region. Thereafter with proximal flow arrest in the left internal carotid artery with balloon dilatation, and constant aspiration with a 60 mL syringe at the hub of the balloon guide catheter, and a Penumbra aspiration device through the large-bore catheter over 2-1/2 minutes, the combination of the retrieval device, the micro catheter and the large-bore catheter were retrieved and removed as constant aspiration was applied at the hub of the balloon guide catheter. Following reversal of flow arrest, a control arteriogram performed through the balloon guide catheter in the left internal carotid artery demonstrated no relief of the obstructed left middle cerebral artery. A second pass was then made with the combination described above. At this time, following ascertained safe positioning of the tip of the microcatheter in the dominant inferior division M3 region, a 6 mm x 40 mm Solitaire X retrieval device was deployed. Again with proximal flow arrest, in the left internal carotid artery, and with constant aspiration at the hub of the large-bore catheter with Penumbra aspiration device, and a 60 mL syringe at the hub of the balloon guide catheter over approximately 2 minutes, the combination was again retrieved and removed. Following  reversal of flow arrest, a control arteriogram performed through the balloon guide demonstrated now revascularization of the left middle cerebral artery proximally, and the inferior division. Tandem stenosis was noted at the mid left M1 segment, and also the inferior division proximally most consistent with arteriosclerotic disease. A repeat arteriogram performed approximately 3 minutes after revascularization demonstrated progressive reocclusion of the left middle cerebral artery. A third pass was then made again using the above combination. Again after having accessed the reoccluded left middle cerebral artery in the M3 inferior division and ascertaining safest position of the tip of the microcatheter, a 5 mm x 33 mm Embotrap retrieval device was then advanced and deployed as mentioned previously. With constant aspiration being applied at the hub of the large-bore catheter just inside the inferior division for 2-1/2 minutes and proximal flow arrest, and aspiration at the hub of the balloon guide catheter, the combination of the retrieval device, and the microcatheter and the large-bore catheter were removed. Following reversal of flow arrest a control arteriogram performed through the left internal carotid artery again demonstrated only marginally improved flow through the left middle cerebral artery M1 segment with pitiful flow into the inferior division. A fourth pass made again using the above combination with a new microcatheter, and a new large-bore intermediary catheter was advanced over a 0.014 inch standard Synchro micro guidewire in the inferior division M3 region. The 5 mm x 33 mm Embotrap retrieval device  was again deployed as mentioned previously. A control arteriogram performed following deployment of the retrieval device again demonstrated improved caliber and revascularization of the left MCA distribution achieving a TICI 2B revascularization. With proximal flow arrest and constant aspiration at the  hub of the 6 French large-bore catheter with the Penumbra aspiration device and constant aspiration at the hub of balloon guide catheter with a 60 mL syringe over 2-1/2 minutes, the combination was retrieved and removed. Following reversal of the flow arrest, a control arteriogram performed through the balloon guide catheter in the left internal carotid artery demonstrated improved flow through the left M1 segment with modestly improved inferior division. There continued to be complete occlusion of the proximal inferior division. A fifth pass was then made using the combination of a 6 [place] Catalyst intermediary catheter with a combination of a Trevo ProVue microcatheter again positioned in the M3 inferior division. Again after having ascertained safe position of the tip of the microcatheter, a 6 mm x 40 mm Solitaire X retrieval device was advanced and deployed. Again with constant aspiration at the hub of the 6 [place] Catalyst guide catheter now advanced just inside the inferior division, and a 60 mL syringe at the hub of the balloon guide catheter with proximal flow arrest, the combination was retrieved after approximately 2-1/2 minutes. Following removal of the combination and reversal of the flow arrest, a control arteriogram performed through the balloon guide catheter demonstrated modestly improved flow through the left MCA distribution with a TICI 2B revascularization of which continued to slowly occlude over the next few minutes. Three of the five aspiration attempts, only a few specks of a clot aperture was attained in the interstices of the retrieval device, and also the aspirate. It was felt that the recurring occlusion was secondary to the significant arteriosclerotic narrowing in the left M1 segment mid to distal third. At this time, an orogastric tube was inserted. A CT scan of the brain was obtained to evaluate for major intracranial bleed. This demonstrated mild subarachnoid hyperdensity in the left  peri-insular and the left anterior parietal regions without evidence mass effect or midline shift. No intraparenchymal hemorrhage was seen. Following discussion with the referring neuro hospitalist, it was elected to proceed with placement of a rescue stent. The patient was given 325 mg of aspirin via the orogastric tube. Patient also given proximally 7.5 mg of Integrilin intra-arterially. Through the balloon guide catheter in the left internal carotid artery a combination of the 6 [place] Catalyst guide catheter with an 021 Trevo ProVue microcatheter was advanced over a 0.014 inch standard Synchro micro to the M3 region of the inferior division followed by the microcatheter. Again the guidewire was removed. Good aspiration obtained from the hub of the microcatheter which was then connected to continuous heparinized saline infusion. Measurements were made of the left middle cerebral artery M1 segment and also the inferior division from earlier angiographic runs. It was decided to use a 4 mm x 24 mm Neuroform Atlas stent. This was then advanced in a coaxial manner and with constant heparinized saline infusion to the distal end of the microcatheter. The proximal and the distal landing zones were then defined. The O ring on the delivery microcatheter was loosened. The stent was then deployed without difficulty in the usual manner. The delivery micro guidewire was then retrieved and removed. The control arteriogram performed through the balloon guide catheter demonstrated excellent revascularization of the left MCA distribution proximally and in the trifurcation regions achieving a TICI  2C revascularization. There continued to be moderate stenosis at the M1 M2 junction on the AP projection but widely patent on the lateral projection. Also there was a moderate arteriosclerotic stenosis in the distal M3 segment. No evidence of extravasation, mass-effect or midline shift was noted. A repeat arteriogram performed 10 minutes  later continued to demonstrate patency of the left MCA stented segment, and also of the left middle cerebral artery. The left posterior communicating artery remained widely patent. The 6 [place] Catalyst guide catheter was retrieved and removed. A final control arteriogram performed through the balloon guide catheter in the left common carotid artery demonstrates patency of the left internal carotid artery extra cranially and intracranially with patency of the left MCA and the left anterior cerebral distributions. The balloon guide catheter was retrieved and removed. The 8 French Pinnacle sheath in the right groin was then removed with the successful placement of an 8 French Angio-Seal closure device with hemostasis. The right groin appeared soft. The left groin 4 French arterial sheath was left in place for close monitoring of the arterial pressures. Dopplerable in the dorsalis pedis, the posterior tibial regions bilaterally unchanged. A second CT angiogram performed demonstrated slightly increased subarachnoid hyperdensity in the left peri-insular and the left anterior parietal regions probably representing a mixture of contrast, and blood. However, no mass-effect or midline shift or intraparenchymal hemorrhage was noted. The patient was given 60 mL of Brilinta via an orogastric tube in addition to the 325 mg of aspirin earlier. The patient was left intubated as per anesthesia to protect the airway, and also because of severe aphasia. The patient was then transferred to the ICU for post thrombectomy management. IMPRESSION: Status post endovascular revascularization of occluded left middle cerebral M1 segment due to underlying severe intracranial arteriosclerosis, with 1 pass with the 4 mm x 40 mm Solitaire X retrieval device, 2 passes with the 6 mm x 40 mm Embotrap retrieval device, and placement of a rescue stent in the left middle cerebral artery for persistent reocclusion, achieving a TICI 2C revascularization.  PLAN: Follow-up in the clinic 4 weeks post discharge. Electronically Signed   By: Julieanne Cotton M.D.   On: 03/25/2020 10:20   CT HEAD CODE STROKE WO CONTRAST  Result Date: 03/24/2020 CLINICAL DATA:  Code stroke. Ataxia. Right-sided weakness. Abnormal speech. Facial droop. Symptoms began at 5 a.m. EXAM: CT HEAD WITHOUT CONTRAST TECHNIQUE: Contiguous axial images were obtained from the base of the skull through the vertex without intravenous contrast. COMPARISON:  None. FINDINGS: Brain: No acute infarct, hemorrhage, or mass lesion is present. Remote infarct is present in the left corona radiata and anterior limb internal capsule. The basal ganglia are intact. Insular ribbon is normal. No acute hemorrhage or mass lesion is present. The ventricles are of normal size. No significant extraaxial fluid collection is present. The brainstem and cerebellum are within normal limits. Vascular: No hyperdense vessel or unexpected calcification. Skull: Calvarium is intact. No focal lytic or blastic lesions are present. No significant extracranial soft tissue lesion is present. Sinuses/Orbits: The paranasal sinuses and mastoid air cells are clear. The globes and orbits are within normal limits. ASPECTS [facility] Stroke Program Early CT Score) - Ganglionic level infarction (caudate, lentiform nuclei, internal capsule, insula, M1-M3 cortex): 7/7 - Supraganglionic infarction (M4-M6 cortex): 3/3 Total score (0-10 with 10 being normal): 10/10 IMPRESSION: 1. No acute intracranial abnormality. 2. 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 3. ASPECTS is 10/10 These results were called by telephone at  the time of interpretation on 03/24/2020 at 5:46 am to provider [facility] , who verbally acknowledged these results. Electronically Signed   By: Marin Roberts M.D.   On: 03/24/2020 05:46   VAS [name] LOWER EXTREMITY VENOUS (DVT)  Result Date: 03/26/2020  Lower Venous DVTStudy Indications: Stroke.   Comparison Study: no prior Performing Technologist: [name] RDMS, RVT  Examination Guidelines: A complete evaluation includes B-mode imaging, spectral Doppler, color Doppler, and power Doppler as needed of all accessible portions of each vessel. Bilateral testing is considered an integral part of a complete examination. Limited examinations for reoccurring indications may be performed as noted. The reflux portion of the exam is performed with the patient in reverse Trendelenburg.  +---------+---------------+---------+-----------+----------+--------------+ RIGHT    CompressibilityPhasicitySpontaneityPropertiesThrombus Aging +---------+---------------+---------+-----------+----------+--------------+ CFV      Full           Yes      Yes                                 +---------+---------------+---------+-----------+----------+--------------+ SFJ      Full                                                        +---------+---------------+---------+-----------+----------+--------------+ FV Prox  Full                                                        +---------+---------------+---------+-----------+----------+--------------+ FV Mid   Full                                                        +---------+---------------+---------+-----------+----------+--------------+ FV DistalFull                                                        +---------+---------------+---------+-----------+----------+--------------+ PFV      Full                                                        +---------+---------------+---------+-----------+----------+--------------+ POP      Full           Yes      Yes                                 +---------+---------------+---------+-----------+----------+--------------+ PTV      Full                                                        +---------+---------------+---------+-----------+----------+--------------+  PERO     Full                                                         +---------+---------------+---------+-----------+----------+--------------+   +---------+---------------+---------+-----------+----------+--------------+ LEFT     CompressibilityPhasicitySpontaneityPropertiesThrombus Aging +---------+---------------+---------+-----------+----------+--------------+ CFV      Full           Yes      Yes                                 +---------+---------------+---------+-----------+----------+--------------+ SFJ      Full                                                        +---------+---------------+---------+-----------+----------+--------------+ FV Prox  Full                                                        +---------+---------------+---------+-----------+----------+--------------+ FV Mid   Full                                                        +---------+---------------+---------+-----------+----------+--------------+ FV DistalFull                                                        +---------+---------------+---------+-----------+----------+--------------+ PFV      Full                                                        +---------+---------------+---------+-----------+----------+--------------+ POP      Full           Yes      Yes                                 +---------+---------------+---------+-----------+----------+--------------+ PTV      Full                                                        +---------+---------------+---------+-----------+----------+--------------+ PERO     Full                                                        +---------+---------------+---------+-----------+----------+--------------+  Summary: RIGHT: - There is no evidence of deep vein thrombosis in the lower extremity.  - No cystic structure found in the popliteal fossa.  LEFT: - There is no evidence of deep vein thrombosis in the lower  extremity.  - No cystic structure found in the popliteal fossa.  *See table(s) above for measurements and observations. Electronically signed by Sherald Hess MD on 03/26/2020 at 8:08:31 PM.    Final     Labs:  CBC: Recent Labs    03/25/20 0307 03/25/20 1000 03/26/20 0556 03/27/20 0706  WBC 11.3* 9.3 11.1* 13.3*  HGB 13.0 12.9* 13.5 14.6  HCT 36.3* 36.2* 38.4* 41.9  PLT 135* 118* 116* 158    COAGS: Recent Labs    03/24/20 0601  INR 1.0  APTT 24    BMP: Recent Labs    03/25/20 0307 03/25/20 1000 03/26/20 0556 03/27/20 0706  NA 138 137 143 143  K 3.3* 3.4* 3.8 2.9*  CL 107 106 110 109  CO2 22 21* 22 23  GLUCOSE 186* 179* 126* 144*  BUN 12 11 16 20   CALCIUM 8.6* 8.5* 9.2 9.4  CREATININE 0.91 0.90 0.96 0.80  GFRNONAA >60 >60 >60 >60  GFRAA >60 >60 >60 >60    LIVER FUNCTION TESTS: Recent Labs    03/24/20 0601 03/25/20 1000 03/27/20 0706  BILITOT 0.7 1.0 1.5*  AST 21 23 19   ALT 27 22 22   ALKPHOS 90 50 54  PROT 6.5 5.9* 6.7  ALBUMIN 3.8 3.2* 3.3*    Assessment and Plan:  History of acute CVA s/p cerebral arteriogram with emergent mechanical thrombectomy and rescue stenting of left MCA M1 segment occlusion achieving a TICI 2C revascularization 03/24/2020 by Dr. Corliss Skains. Patient's condition stable- lethargic but opens eyes to voice but does not follow commands, moving left side spontaneously but no movements of right side. Right groin incision (access) and left groin incision (arterial line) stable, distal pulses 1+ bilaterally. Continue taking Brilinta 90 mg twice daily and Aspirin 81 mg once daily. Plan to follow-up with repeat CTA head/neck (with contrast) 3 months from procedure- message sent to IR schedulers to facilitate this. Further plans per neurology/CCM- appreciate and agree with management. Please call NIR with questions/concerns.   Electronically Signed: Elwin Mocha, PA-C 03/27/2020, 10:24 AM   I spent a total of 25 Minutes at the  the patient's bedside AND on the patient's hospital floor or unit, greater than 50% of which was counseling/coordinating care for CVA s/p revascularization.

## 2020-03-28 ENCOUNTER — Inpatient Hospital Stay (HOSPITAL_COMMUNITY): Payer: BC Managed Care – PPO

## 2020-03-28 DIAGNOSIS — J9600 Acute respiratory failure, unspecified whether with hypoxia or hypercapnia: Secondary | ICD-10-CM | POA: Diagnosis not present

## 2020-03-28 LAB — CBC WITH DIFFERENTIAL/PLATELET
Abs Immature Granulocytes: 0.05 10*3/uL (ref 0.00–0.07)
Basophils Absolute: 0 10*3/uL (ref 0.0–0.1)
Basophils Relative: 0 %
Eosinophils Absolute: 0 10*3/uL (ref 0.0–0.5)
Eosinophils Relative: 0 %
HCT: 45.2 % (ref 39.0–52.0)
Hemoglobin: 15.5 g/dL (ref 13.0–17.0)
Immature Granulocytes: 0 %
Lymphocytes Relative: 5 %
Lymphs Abs: 0.6 10*3/uL — ABNORMAL LOW (ref 0.7–4.0)
MCH: 31.1 pg (ref 26.0–34.0)
MCHC: 34.3 g/dL (ref 30.0–36.0)
MCV: 90.8 fL (ref 80.0–100.0)
Monocytes Absolute: 1 10*3/uL (ref 0.1–1.0)
Monocytes Relative: 8 %
Neutro Abs: 11.1 10*3/uL — ABNORMAL HIGH (ref 1.7–7.7)
Neutrophils Relative %: 87 %
Platelets: 188 10*3/uL (ref 150–400)
RBC: 4.98 MIL/uL (ref 4.22–5.81)
RDW: 13.5 % (ref 11.5–15.5)
WBC: 12.8 10*3/uL — ABNORMAL HIGH (ref 4.0–10.5)
nRBC: 0 % (ref 0.0–0.2)

## 2020-03-28 LAB — URINALYSIS, COMPLETE (UACMP) WITH MICROSCOPIC
Bilirubin Urine: NEGATIVE
Glucose, UA: >=500 mg/dL — AB
Hgb urine dipstick: NEGATIVE
Ketones, ur: 20 mg/dL — AB
Leukocytes,Ua: NEGATIVE
Nitrite: NEGATIVE
Protein, ur: 30 mg/dL — AB
RBC / HPF: >50 RBC/hpf — ABNORMAL HIGH (ref 0–5)
Specific Gravity, Urine: 1.028 (ref 1.005–1.030)
pH: 5 (ref 5.0–8.0)

## 2020-03-28 LAB — GLUCOSE, CAPILLARY
Glucose-Capillary: 173 mg/dL — ABNORMAL HIGH (ref 70–99)
Glucose-Capillary: 180 mg/dL — ABNORMAL HIGH (ref 70–99)
Glucose-Capillary: 218 mg/dL — ABNORMAL HIGH (ref 70–99)
Glucose-Capillary: 199 mg/dL — ABNORMAL HIGH (ref 70–99)
Glucose-Capillary: 209 mg/dL — ABNORMAL HIGH (ref 70–99)
Glucose-Capillary: 220 mg/dL — ABNORMAL HIGH (ref 70–99)

## 2020-03-28 LAB — MAGNESIUM: Magnesium: 2.6 mg/dL — ABNORMAL HIGH (ref 1.7–2.4)

## 2020-03-28 LAB — BASIC METABOLIC PANEL
Anion gap: 15 (ref 5–15)
BUN: 21 mg/dL — ABNORMAL HIGH (ref 6–20)
CO2: 20 mmol/L — ABNORMAL LOW (ref 22–32)
Calcium: 9.9 mg/dL (ref 8.9–10.3)
Chloride: 119 mmol/L — ABNORMAL HIGH (ref 98–111)
Creatinine, Ser: 0.9 mg/dL (ref 0.61–1.24)
GFR calc Af Amer: >60 mL/min (ref >60)
GFR calc non Af Amer: >60 mL/min (ref >60)
Glucose, Bld: 194 mg/dL — ABNORMAL HIGH (ref 70–99)
Potassium: 3.3 mmol/L — ABNORMAL LOW (ref 3.5–5.1)
Sodium: 154 mmol/L — ABNORMAL HIGH (ref 135–145)

## 2020-03-28 LAB — SODIUM
Sodium: 158 mmol/L — ABNORMAL HIGH (ref 135–145)
Sodium: 163 mmol/L (ref 135–145)
Sodium: 163 mmol/L (ref 135–145)

## 2020-03-28 LAB — PHOSPHORUS: Phosphorus: 2.2 mg/dL — ABNORMAL LOW (ref 2.5–4.6)

## 2020-03-28 MED ORDER — ASPIRIN 325 MG PO TABS: 325.0000 mg | ORAL_TABLET | Freq: Every day | ORAL | Status: DC

## 2020-03-28 MED ORDER — BUTALBITAL-APAP-CAFFEINE 50-325-40 MG PO TABS: 1.0000 | ORAL_TABLET | Freq: Three times a day (TID) | ORAL | Status: DC | PRN

## 2020-03-28 MED ORDER — THIAMINE HCL 100 MG/ML IJ SOLN: 100.0000 mg | Freq: Every day | INTRAMUSCULAR | Status: DC

## 2020-03-28 MED ORDER — THIAMINE HCL 100 MG PO TABS
100.0000 mg | ORAL_TABLET | Freq: Every day | ORAL | Status: DC
  Administered 2020-03-28 – 2020-04-02 (×5): 100 mg
  Filled 2020-03-28 (×5): qty 1

## 2020-03-28 MED ORDER — THIAMINE HCL 100 MG/ML IJ SOLN
100.0000 mg | Freq: Every day | INTRAMUSCULAR | Status: DC
  Administered 2020-03-31: 100 mg via INTRAVENOUS
  Filled 2020-03-28: qty 2

## 2020-03-28 MED ORDER — THIAMINE HCL 100 MG PO TABS: 100.0000 mg | ORAL_TABLET | Freq: Every day | ORAL | Status: DC

## 2020-03-28 MED ORDER — SODIUM CHLORIDE 0.9 % IV SOLN: INTRAVENOUS | Status: DC

## 2020-03-28 MED ORDER — SODIUM CHLORIDE 0.9 % IV BOLUS
500.0000 mL | Freq: Once | INTRAVENOUS | Status: AC
  Administered 2020-03-28: 18:00:00 500 mL via INTRAVENOUS

## 2020-03-28 MED ORDER — PANTOPRAZOLE SODIUM 40 MG PO PACK
40.0000 mg | PACK | Freq: Every day | ORAL | Status: DC
  Administered 2020-03-28 – 2020-04-14 (×18): 40 mg
  Filled 2020-03-28 (×18): qty 20

## 2020-03-28 MED ORDER — ASPIRIN 325 MG PO TABS
325.0000 mg | ORAL_TABLET | Freq: Every day | ORAL | Status: DC
  Administered 2020-03-28 – 2020-04-01 (×5): 325 mg
  Filled 2020-03-28 (×4): qty 1

## 2020-03-28 MED ORDER — POTASSIUM CHLORIDE 20 MEQ/15ML (10%) PO SOLN
40.0000 meq | ORAL | Status: AC
  Administered 2020-03-28 (×3): 40 meq
  Filled 2020-03-28 (×3): qty 30

## 2020-03-28 MED ORDER — LOSARTAN POTASSIUM 50 MG PO TABS
25.0000 mg | ORAL_TABLET | Freq: Every day | ORAL | Status: DC
  Administered 2020-03-28 – 2020-03-29 (×2): 25 mg
  Filled 2020-03-28 (×2): qty 1

## 2020-03-28 MED ORDER — FREE WATER
100.0000 mL | Status: AC
  Administered 2020-03-28 – 2020-03-29 (×6): 100 mL

## 2020-03-28 NOTE — Progress Notes (Signed)
CRITICAL VALUE ALERT  Critical Value:  Sodium 163  Date & Time Notified: 03/28/2020 @ 1731  Provider Notified: A. Wilford Corner, MD  Orders Received/Actions taken: 3% Hypertonic Saline d/c'd, see MAR for all orders

## 2020-03-28 NOTE — Progress Notes (Signed)
NAME:  Brad Delacruz, MRN:  [id number], DOB:  [birth year], LOS: 4 ADMISSION DATE:  03/24/2020, CONSULTATION DATE:  03/24/20 REFERRING MD:  Corliss Skains, CHIEF COMPLAINT:  Vent management   Brief History   58yo male with hx DM, HTN presented 4/20 to [facility] with sudden onset R sided weakness and aphasia.  SBP >200.  CT revealed dense L MCA occlusion.  He was tx urgently to [facility], IV tPA given and underwent IR revascularization of occluded L MCA.  Pt left intubated post procedure and PCCM consulted for vent and ICU management.    Past Medical History  DM  Significant Hospital Events   4/20: Admitted  4/20: S/p tPa and intracranial thrombectomy - rescue stenting of left MCA M1 segment occlusion achieving a TICI 2C revascularization 03/24/2020 by Dr. Corliss Skains.  Consults:  PCCM  Procedures:  4/20: Left MCA revascularization  4.21 - Difficulties with BP control overnight. Agitated. Started on versed 10 mg/hr.  4/22 - 4/22 - off sedation gtt. DOing PSV. PT/OT having him sit on side of bed. He is a bit drowsy but followed simple command like nodding to how are you and taking a deep breath. GAg +   4/23 - > off vent x 24h. IR wants him on brilinta and aspirin with repeat CTA head and neck in 3 months. Duplex LE negative for DVT . Worsening midline shift to 5-58mm and 3% saline started by neuro via PIV. RN feel mental status overall similar  Significant Diagnostic Tests:  CTA head 4/20>>>  1. Large left MCA territory nonhemorrhagic infarct. 2. Acute left M1 large vessel occlusion. 3. Large penumbra with mismatch volume of 99 mL. 4. Collateral vessels are present within the sylvian fissure.  CT head 4/23  IMPRESSION: 1. Continued evolution of large Left MCA infarct with increased intracranial mass effect since 03/26/2019. Rightward midline shift now 5-6 mm. Basilar cisterns remain patent. 2. Stable left hemisphere subarachnoid hemorrhage and/or contrast, including trace blood layering  along the midline tentorium. 3. No new intracranial abnormality.  Electronically Signed: By: Odessa Fleming M.D. On: 03/27/2020 10:31  Micro Data:  4/20: SARS COVID negative   Antimicrobials:  None   Interim history/subjective:   4/24 - CT head today with persistent 39mm midline shift but overall stable. Not worse. Not on vent. Not on pressors. On TF  Objective   Blood pressure (!) 155/87, pulse (!) 118, temperature 98.5 F (36.9 C), temperature source Axillary, resp. rate 11, height 5\' 5"  (1.651 m), weight 75.3 kg, SpO2 96 %.        Intake/Output Summary (Last 24 hours) at 03/28/2020 03/30/2020 Last data filed at 03/28/2020 0600 Gross per 24 hour  Intake 2019.83 ml  Output 3875 ml  Net -1855.17 ml   Filed Weights   03/25/20 0323 03/27/20 0500 03/28/20 0457  Weight: 83.8 kg 80.3 kg 75.3 kg   Examintion General Appearance:  overweight Head:  Normocephalic, without obvious abnormality, atraumatic Eyes:  PERRL - yes, conjunctiva/corneas - clear     Ears:  Normal external ear canals, both ears Nose:  G tube - no Throat:  ETT TUBE - no , OG tube - no Neck:  Supple,  No enlargement/tenderness/nodules Lungs: Clear to auscultation bilaterall Heart:  S1 and S2 normal, no murmur, CVP - no.  Pressors - no Abdomen:  Soft, no masses, no organomegaly Genitalia / Rectal:  Not done Extremities:  Extremities- intact Skin:  ntact in exposed areas . Sacral area - has tape Neurologic:  Sedation -  none -> RASS - +1 to -2. Gag +. dEnise hemiplegia right side Gag +     unchagned exam since y esterday    Resolved Hospital Problem list   None  Assessment & Plan:   Acute L MCA stroke - s/p IV tPA and IR revascularization   03/28/2020 - dense right hemiplegia. Mild sleepiness. Follows some commands. Similar to yeasterday thought midline shift worse and on 3% saline  Plan - Per stroke team    Acute respiratory failure - post neuro IR in setting L MCA stroke / s/p extubation  4/22   03/28/2020 - >remains extubated but at high risk for reintubation given large stroke. Seem stabilized  Plan  - dc all sedation from [facility] - intubate if mental status orsens   Hx HTN - cleviprex gtt required on arrival - SBP goal between 120-140  - off gtt 03/27/2020 x 24h   Hx Type 2 DM  - CBG goal < 180  - Improving with SSI, continue to monitor   Best practice:  Diet: NPO Pain/Anxiety/Delirium protocol (if indicated): PAD protocol - prn VAP protocol (if indicated): ordered  DVT prophylaxis: SCD's GI prophylaxis: pepcid Glucose control: SSI Mobility: BR Code Status: full Family Communication: Per primary Disposition: icu 4N  Ccm will sign off  SIGNATURE    Dr. Kalman Shan, M.D., F.C.C.P,  Pulmonary and Critical Care Medicine Staff Physician, [facility] Director - Interstitial Lung Disease  Program  Pulmonary Fibrosis [facility] [place], [place], [address]  Pager: [phone], If no answer or between  15:00h - 7:00h: call [phone] Telephone: [phone]  9:52 AM 03/28/2020      LABS    PULMONARY Recent Labs  Lab 03/24/20 0613 03/24/20 1209  PHART  --  7.361  PCO2ART  --  39.5  PO2ART  --  257.0*  HCO3  --  22.4  TCO2 27 24  O2SAT  --  100.0    CBC Recent Labs  Lab 03/26/20 0556 03/27/20 0706 03/28/20 0529  HGB 13.5 14.6 15.5  HCT 38.4* 41.9 45.2  WBC 11.1* 13.3* 12.8*  PLT 116* 158 188    COAGULATION Recent Labs  Lab 03/24/20 0601  INR 1.0    CARDIAC  No results for input(s): TROPONINI in the last 168 hours. No results for input(s): PROBNP in the last 168 hours.   CHEMISTRY Recent Labs  Lab 03/25/20 0307 03/25/20 0307 03/25/20 1000 03/25/20 1000 03/26/20 0556 03/26/20 0556 03/27/20 0706 03/27/20 1042 03/27/20 1606 03/27/20 2218 03/28/20 0529  NA 138   < > 137   < > 143   < > 143 143 148* 145 154*  K 3.3*   < > 3.4*   < > 3.8   < > 2.9*  --    --   --  3.3*  CL 107  --  106  --  110  --  109  --   --   --  119*  CO2 22  --  21*  --  22  --  23  --   --   --  20*  GLUCOSE 186*  --  179*  --  126*  --  144*  --   --   --  194*  BUN 12  --  11  --  16  --  20  --   --   --  21*  CREATININE 0.91  --  0.90  --  0.96  --  0.80  --   --   --  0.90  CALCIUM 8.6*  --  8.5*  --  9.2  --  9.4  --   --   --  9.9  MG  --   --  2.1  --   --   --  2.3  --   --   --  2.6*  PHOS  --   --  2.4*  --   --   --  2.5  --   --   --  2.2*   < > = values in this interval not displayed.   Estimated Creatinine Clearance: 83.8 mL/min (by C-G formula based on SCr of 0.9 mg/dL).   LIVER Recent Labs  Lab 03/24/20 0601 03/25/20 1000 03/27/20 0706  AST 21 23 19   ALT 27 22 22   ALKPHOS 90 50 54  BILITOT 0.7 1.0 1.5*  PROT 6.5 5.9* 6.7  ALBUMIN 3.8 3.2* 3.3*  INR 1.0  --   --      INFECTIOUS No results for input(s): LATICACIDVEN, PROCALCITON in the last 168 hours.   ENDOCRINE CBG (last 3)  Recent Labs    03/27/20 2333 03/28/20 0336 03/28/20 0711  GLUCAP 172* 173* 180*         IMAGING x48h  - image(s) personally visualized  -   highlighted in bold DG Abd 1 View  Result Date: 03/26/2020 CLINICAL DATA:  Patient for NG tube placement. EXAM: ABDOMEN - 1 VIEW COMPARISON:  None. FINDINGS: Two radiographs of the abdomen were obtained. On the second radiograph the enteric tube and side port terminate within the stomach. Lung bases are clear. Nonobstructed bowel gas pattern. On the initial radiograph the enteric tube was coiled within the esophagus. IMPRESSION: Enteric tube tip and side port project within the stomach. Electronically Signed   By: 03/30/20 M.D.   On: 03/26/2020 12:57   CT HEAD WO CONTRAST  Result Date: 03/28/2020 CLINICAL DATA:  59 year old male status post code stroke presentation on 03/24/2020 with right side weakness, left MCA M1 ELV0. Status post endovascular revascularization including rescue stenting. Increasing  intracranial mass effect yesterday. EXAM: CT HEAD WITHOUT CONTRAST TECHNIQUE: Contiguous axial images were obtained from the base of the skull through the vertex without intravenous contrast. COMPARISON:  Head CT 03/27/2020 and earlier. FINDINGS: Brain: Left MCA cytotoxic edema again noted. Stable blood and/or contrast in and around the left sylvian fissure and regional subarachnoid spaces. Intracranial mass effect with rightward midline shift is stable since yesterday, shift is 5 mm. Stable ventricle size and configuration. No ventriculomegaly. Basilar cisterns remain patent. Gray-white differentiation remains stable elsewhere. Vascular: Left MCA stent redemonstrated. Mild Calcified atherosclerosis at the skull base. Skull: Stable, negative. Sinuses/Orbits: Visualized paranasal sinuses and mastoids are stable and well pneumatized. Right nasoenteric tube remains in place. Other: Leftward gaze today. Visualized scalp soft tissues are within normal limits. IMPRESSION: 1. Stable from the CT yesterday: Large left MCA infarct with cytotoxic edema with 5 mm of rightward midline shift. Basilar cisterns remain patent. Subarachnoid blood and/or contrast in and around the left Sylvian fissure. 2. No new intracranial abnormality. Electronically Signed   By: 03/26/2020 M.D.   On: 03/28/2020 07:04   CT HEAD WO CONTRAST  Addendum Date: 03/27/2020   ADDENDUM REPORT: 03/27/2020 10:44 ADDENDUM: Study discussed by telephone with Dr. 03/29/2020 on 03/27/2020 at 1039 hours. Electronically Signed   By: Marvel Plan.D.  On: 03/27/2020 10:44   Result Date: 03/27/2020 CLINICAL DATA:  59 year old male status post code stroke presentation yesterday with right side weakness, left MCA M1 ELV0. Status post endovascular revascularization including rescue stenting. EXAM: CT HEAD WITHOUT CONTRAST TECHNIQUE: Contiguous axial images were obtained from the base of the skull through the vertex without intravenous contrast. COMPARISON:  Brain MRI and  head CT 03/25/2020 and earlier. FINDINGS: Brain: Cytotoxic edema from large left MCA territory infarct. Increased intracranial mass effect since 03/25/2020, midline shift now 5-6 mm. Mass effect on the left lateral ventricle, but no ventriculomegaly. Continued, trapped hyperdense material in the left sylvian fissure and other nearby subarachnoid spaces-blood versus contrast. No intraventricular hemorrhage. There is a trace amount of blood layering along the midline tentorium on series 3, image 15. No new intracranial blood. Gray-white matter differentiation elsewhere remains normal. Basilar cisterns remain patent. Vascular: Stable left MCA stent. Skull: Stable, intact. Sinuses/Orbits: Visualized paranasal sinuses and mastoids are stable and well pneumatized. Right nasoenteric tube in place. Other: No acute orbit or scalp soft tissue findings. IMPRESSION: 1. Continued evolution of large Left MCA infarct with increased intracranial mass effect since 03/26/2019. Rightward midline shift now 5-6 mm. Basilar cisterns remain patent. 2. Stable left hemisphere subarachnoid hemorrhage and/or contrast, including trace blood layering along the midline tentorium. 3. No new intracranial abnormality. Electronically Signed: By: Genevie Ann M.D. On: 03/27/2020 10:31   VAS [name] LOWER EXTREMITY VENOUS (DVT)  Result Date: 03/26/2020  Lower Venous DVTStudy Indications: Stroke.  Comparison Study: no prior Performing Technologist: June Leap RDMS, RVT  Examination Guidelines: A complete evaluation includes B-mode imaging, spectral Doppler, color Doppler, and power Doppler as needed of all accessible portions of each vessel. Bilateral testing is considered an integral part of a complete examination. Limited examinations for reoccurring indications may be performed as noted. The reflux portion of the exam is performed with the patient in reverse Trendelenburg.  +---------+---------------+---------+-----------+----------+--------------+  RIGHT    CompressibilityPhasicitySpontaneityPropertiesThrombus Aging +---------+---------------+---------+-----------+----------+--------------+ CFV      Full           Yes      Yes                                 +---------+---------------+---------+-----------+----------+--------------+ SFJ      Full                                                        +---------+---------------+---------+-----------+----------+--------------+ FV Prox  Full                                                        +---------+---------------+---------+-----------+----------+--------------+ FV Mid   Full                                                        +---------+---------------+---------+-----------+----------+--------------+ FV DistalFull                                                        +---------+---------------+---------+-----------+----------+--------------+  PFV      Full                                                        +---------+---------------+---------+-----------+----------+--------------+ POP      Full           Yes      Yes                                 +---------+---------------+---------+-----------+----------+--------------+ PTV      Full                                                        +---------+---------------+---------+-----------+----------+--------------+ PERO     Full                                                        +---------+---------------+---------+-----------+----------+--------------+   +---------+---------------+---------+-----------+----------+--------------+ LEFT     CompressibilityPhasicitySpontaneityPropertiesThrombus Aging +---------+---------------+---------+-----------+----------+--------------+ CFV      Full           Yes      Yes                                 +---------+---------------+---------+-----------+----------+--------------+ SFJ      Full                                                         +---------+---------------+---------+-----------+----------+--------------+ FV Prox  Full                                                        +---------+---------------+---------+-----------+----------+--------------+ FV Mid   Full                                                        +---------+---------------+---------+-----------+----------+--------------+ FV DistalFull                                                        +---------+---------------+---------+-----------+----------+--------------+ PFV      Full                                                        +---------+---------------+---------+-----------+----------+--------------+  POP      Full           Yes      Yes                                 +---------+---------------+---------+-----------+----------+--------------+ PTV      Full                                                        +---------+---------------+---------+-----------+----------+--------------+ PERO     Full                                                        +---------+---------------+---------+-----------+----------+--------------+     Summary: RIGHT: - There is no evidence of deep vein thrombosis in the lower extremity.  - No cystic structure found in the popliteal fossa.  LEFT: - There is no evidence of deep vein thrombosis in the lower extremity.  - No cystic structure found in the popliteal fossa.  *See table(s) above for measurements and observations. Electronically signed by Sherald Hesshristopher Clark MD on 03/26/2020 at 8:08:31 PM.    Final

## 2020-03-28 NOTE — Progress Notes (Signed)
Pharmacy - 3% NaCl  Na 154 > 158 on 75 ml/hr  Spoke with Theodoro Grist Rinehuls about rising Na. He will discuss with Dr Lucia Gaskins. No new orders given.  Thank you for involving pharmacy in this patient's care.  Loura Back, PharmD, BCPS Clinical Pharmacist Clinical phone for 03/28/2020 until 3p is [phone] 03/28/2020 12:55 PM  **Pharmacist phone directory can be found on [email or web address] listed under [facility]**

## 2020-03-28 NOTE — Evaluation (Signed)
Speech Language Pathology Evaluation Patient Details Name: Brad Delacruz MRN: [id number] DOB: June 19, 1961 Today's Date: 03/28/2020 Time: 3662-9476 SLP Time Calculation (min) (ACUTE ONLY): 19 min  Problem List:  Patient Active Problem List   Diagnosis Date Noted  . Acute ischemic stroke ([place]) 03/24/2020  . CVA (cerebral vascular accident) ([place]) 03/24/2020  . Middle cerebral artery embolism, left 03/24/2020   Past Medical History:  Past Medical History:  Diagnosis Date  . Diabetes mellitus without complication [facility])    Past Surgical History:  Past Surgical History:  Procedure Laterality Date  . IR CT HEAD LTD  03/24/2020  . IR CT HEAD LTD  03/24/2020  . IR INTRA CRAN STENT  03/24/2020  . IR PATIENT EVAL TECH 0-60 MINS  03/25/2020  . IR PERCUTANEOUS ART THROMBECTOMY/INFUSION INTRACRANIAL INC DIAG ANGIO  03/24/2020  . RADIOLOGY WITH ANESTHESIA N/A 03/24/2020   Procedure: IR WITH ANESTHESIA;  Surgeon: Luanne Bras, MD;  Location: [place];  Service: Radiology;  Laterality: N/A;   HPI:  59 y.o. male past medical history of diabetes, hypertension which he does not take medication for, presented to the emergency room at [facility] with last known well of 5 AM when he was at work and was noted to have a sudden onset of right-sided weakness and inability to talk. Pt given TPA and s/p thrombectomy on 4/20. Pt remains intubated and restrained as of 4/22.  PT was intubated from 4/20-4/22.  Head CT reported a large left MCA infarct.     Assessment / Plan / Recommendation Clinical Impression  Pt was seen for a cognitive-linguistic evaluation in the setting of a large L MCA infarct.  Pt was lethargic throughout this evaluation, therefore it was abbreviated.  Pt's family was present and provided the pt's hx.  [employer] video interpreter was used for this evaluation.  Pt's wife reported that the pt was fully independent prior to admission and that he lives at home with his wife and 2 of his  daughters (he has 5 children total).  Per wife, pt was working full time in a night shift position.  Per limited evaluation, pt appears to present with global aphasia c/b deficits in expressive and receptive language.  Pt followed 1/5 one-step commands.  He did not attempt to verbalize and he was unable to state his name, repeat words, name objects in the room, or answer yes/no questions.  Eye contact with family was sporadic and pt appeared to have a L gaze preference.  Recommend additional ST 5x weekly (CIR) at time of discharge.  SLP will f/u acutely for diagnostic treatment.      SLP Assessment  SLP Recommendation/Assessment: Patient needs continued Speech Lanaguage Pathology Services SLP Visit Diagnosis: Aphasia (R47.01)    Follow Up Recommendations  Inpatient Rehab    Frequency and Duration min 2x/week  2 weeks      SLP Evaluation Cognition  Overall Cognitive Status: Difficult to assess Arousal/Alertness: Lethargic Orientation Level: Disoriented X4 Attention: Sustained Sustained Attention: Impaired Sustained Attention Impairment: Functional basic       Comprehension  Auditory Comprehension Overall Auditory Comprehension: Impaired Yes/No Questions: Impaired Basic Biographical Questions: 0-25% accurate Commands: Impaired One Step Basic Commands: 0-24% accurate    Expression Expression Primary Mode of Expression: Verbal Verbal Expression Overall Verbal Expression: Impaired Initiation: Impaired Repetition: Impaired Level of Impairment: Word level Naming: Impairment Responsive: Not tested Confrontation: Impaired Convergent: Not tested Divergent: Not tested Pragmatics: Impairment Impairments: Eye contact Interfering Components: Attention Written Expression Dominant Hand: Right Written  Expression: Not tested   Oral / Motor  Oral Motor/Sensory Function Overall Oral Motor/Sensory Function: Other (comment)(Unable to evaluate ) Motor Speech Overall Motor Speech: (No  attempted verbalizations )   GO                   Villa Herb., M.S., CCC-SLP Acute Rehabilitation Services Office: [phone]  Shanon Rosser [facility] 03/28/2020, 12:36 PM

## 2020-03-28 NOTE — Progress Notes (Signed)
Called for critical sodium value.  163 now.  Recommendations -Add free water flushes-100 cc every 4 hours -Stop 3% -Start normal saline at 100 cc an hour. -Give a bolus of normal saline 500 cc now. -Recheck sodium in 6 hours. Consider checking in with CCM for assistance with fluid/electrolyte status.  -- Milon Dikes, MD [facility] Pager: [phone] If 7pm to 7am, please call on call as listed on AMION.

## 2020-03-28 NOTE — Progress Notes (Signed)
STROKE TEAM PROGRESS NOTE   INTERVAL HISTORY No family at the bedside.  Patient tolerating well from extubation, neuro stable, watching edema closely.  Still global aphasia and right hemiplegia but withdraws on the right, left conjugate gaze, favors the left however can cross midline,  and right neglect.  CT showed large left MCA infarct with more clear demarcation.  Cerebral edema started with midline shift 5 mm.  Given neurologically stable, will repeat CT in a.m. On 3%. Needing prn BPs start oral meds via tube. Still pretty drowsy. No sedation.   Vitals:   03/28/20 0630 03/28/20 0645 03/28/20 0700 03/28/20 0715  BP: (!) 153/99 (!) 165/105 (!) 176/99 (!) 167/102  Pulse: (!) 115 (!) 125 (!) 109 (!) 118  Resp: 15 (!) 23 (!) 8 11  Temp:      TempSrc:      SpO2: 96% 98% 97% 96%  Weight:      Height:        CBC:  Recent Labs  Lab 03/27/20 0706 03/28/20 0529  WBC 13.3* 12.8*  NEUTROABS 10.9* 11.1*  HGB 14.6 15.5  HCT 41.9 45.2  MCV 89.9 90.8  PLT 158 619    Basic Metabolic Panel:  Recent Labs  Lab 03/27/20 0706 03/27/20 1042 03/27/20 2218 03/28/20 0529  NA 143   < > 145 154*  K 2.9*  --   --  3.3*  CL 109  --   --  119*  CO2 23  --   --  20*  GLUCOSE 144*  --   --  194*  BUN 20  --   --  21*  CREATININE 0.80  --   --  0.90  CALCIUM 9.4  --   --  9.9  MG 2.3  --   --  2.6*  PHOS 2.5  --   --  2.2*   < > = values in this interval not displayed.   Lipid Panel:     Component Value Date/Time   CHOL 180 03/25/2020 0307   TRIG 269 (H) 03/26/2020 0556   HDL 21 (L) 03/25/2020 0307   CHOLHDL 8.6 03/25/2020 0307   VLDL UNABLE TO CALCULATE IF TRIGLYCERIDE OVER 400 mg/dL 03/25/2020 0307   LDLCALC UNABLE TO CALCULATE IF TRIGLYCERIDE OVER 400 mg/dL 03/25/2020 0307   HgbA1c:  Lab Results  Component Value Date   HGBA1C 9.2 (H) 03/24/2020   Urine Drug Screen:     Component Value Date/Time   LABOPIA NONE DETECTED 03/25/2020 0229   COCAINSCRNUR NONE DETECTED 03/25/2020  0229   LABBENZ POSITIVE (A) 03/25/2020 0229   AMPHETMU NONE DETECTED 03/25/2020 0229   THCU NONE DETECTED 03/25/2020 0229   LABBARB NONE DETECTED 03/25/2020 0229    Alcohol Level     Component Value Date/Time   ETH <10 03/24/2020 0601    IMAGING past 24 hours  CT HEAD WO CONTRAST 03/28/2020 IMPRESSION:  1. Stable from the CT yesterday: Large left MCA infarct with cytotoxic edema with 5 mm of rightward midline shift. Basilar cisterns remain patent. Subarachnoid blood and/or contrast in and around the left Sylvian fissure.  2. No new intracranial abnormality.   CT HEAD WO CONTRAST 03/27/2020   IMPRESSION:  1. Continued evolution of large Left MCA infarct with increased intracranial mass effect since 03/26/2019. Rightward midline shift now 5-6 mm. Basilar cisterns remain patent.  2. Stable left hemisphere subarachnoid hemorrhage and/or contrast, including trace blood layering along the midline tentorium.  3. No new intracranial abnormality.  PHYSICAL EXAM   Temp:  [98.8 F (37.1 C)-100.1 F (37.8 C)] 99.3 F (37.4 C) (04/24 0400) Pulse Rate:  [55-127] 118 (04/24 0715) Resp:  [8-28] 11 (04/24 0715) BP: (144-182)/(67-110) 167/102 (04/24 0715) SpO2:  [94 %-99 %] 96 % (04/24 0715) Weight:  [75.3 kg] 75.3 kg (04/24 0457)  General - Well nourished, well developed, not in acute distress.  Ophthalmologic - fundi not visualized due to noncooperation.  Cardiovascular - Regular rate and rhythm.  Neuro - drowsy, openes eyes to stim, global aphasia, not following commands, not verbal. Eyes left gaze position, able to cross midline , not blinking to visual threat on the right, blinking to threat on the left,  able to track on the left but not right. Mild lower right facial droop.  Tongue protrusion not cooperative. Spontaneous movement of LUE and LLE, LUE at least 4/5, no drift when lifted in air. LLE 3/5 with pain stimulation. RUE flaccid, right-sided neglect, RLE slight withdraw to  pain. DTR 1+. Right upgoing toe, left downgoing toe, . Sensation, coordination and gait not tested.   ASSESSMENT/PLAN [name] is a 59 y.o. male with history of HTN and DB noncompliant w/ medications presenting to [facility] with R sided weakness and inability to talk. NIH 28. CTA showed L M1 occlusion. Transferred to [facility]. [facility]. On arrival, received IV tPA 03/24/2020 at 0725 followed by mechanical thrombectomy w/ TICI2C revascularization and rescue stent placement.  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 Old L corona radiata and internal capsule infarcts. ASPECTS 10.     CTA head & neck large L MCA territory infarct. L M1 LVO. Collateral vessels in sylvian fissure. B ICA bifurcation atherosclerosis. Aortic atherosclerosis.   CT perfusion large L MCA penumbra.   Cerebral angio L MCA M1 occlusion w/ TICI2c revascularization x 6 passes. Rescue stenting for reocclusion   CT head 4/21 - evolving large left MCA infarct, trace midline shift, persistent high density and left C1 fissure, contrast versus blood  MRI  Large L MCA infarct evolution w/ edema and mass effect w/ 47mm L midline shift. SAH L sylvian fissure, L cerebral cortical sulci and basilar cisterns.  MRA  L M1 stent w/o flow c/w reocclusion. L ICA filling defect c/w small thrombus. VBJ w/ infundibulum vs aneurysm.  CT head 4/23 continued evolution large L MCA infarct w/ increased edema, now w/ 5-72mm midline shift. Stable L SAH w/ trace blood in tentorium.    CT head 4/24 - stable 5 mm shift. Large Lt MCA infarct. Subarachnoid blood and/or contrast in and around the left Sylvian fissure.   2D Echo EF 55-60%. No source of embolus   LE doppler neg DVT  May consider TEE and loop recorder if neuro  improvement later  LDL 55.1  TG 798->675->269 (on 4/22)   HgbA1c 9.2  SCDs for VTE prophylaxis  No antithrombotic prior to admission, now on aspirin 81 mg daily and Brilinta (ticagrelor) 90 mg bid given stent placement and L tICA thrombus.   Therapy recommendations:  SNF - possible CIR (consult pending)  Disposition:  pending   Cerebral edema  CT showed large left MCA infarct with minimal midline shift  MRI  Large L MCA infarct evolution w/ edema and mass effect w/  72mm L midline shift.   CT head 4/23 continued evolution large L MCA infarct w/ increased edema, now w/ 5-54mm midline shift. Stable L SAH w/ trace blood in tentorium.   On 3% saline @ 75 via peripheral line - may consider central line if needed  Goal Na 150-155  Na 143->145->154  CT head 4/24 - stable (see above)  Based on CT 4/24, may need to consult NSG for possible hemicrani  Acute Respiratory Failure   Intubated for IR, left intubated post IR for airway protection secondary to acute L MCA stroke  Extubation 4/22  CCM on board  Tolerating well so far  Close monitoring - high risk for neuro decline and reintubation  Hypertensive Emergency  SBP > 210 on arrival  Home meds:  Non-compliant - med rec lists:  losartan 25   BP variable during the night w/ agitation  Cleviprex changed to cardene d/t elevated TG -> now off  BP goal < 160 given SAH -> resume home dose of Cozaar 25 mg daily.  Hydralazine and labetalol PRN . Long-term BP goal normotensive  Fever  Leukocytosis WBC 11.3->9.3->11.1->13.3->12.8  Temp - 100.1->99.3  Will order CXR - pending  Will order U/A - pending  Consider blood cultures and respiratory cultures if fever spikes  Currently not on antibiotics   Hyperlipidemia  Home meds:  Non-compliant - med rec lists:  pravachol 10, crestor 5  LDL 55.1, goal < 70  TG 798->675-> 269 (on 4/22)  On lipitor 80  Continue statin at discharge  Diabetes type II  Uncontrolled  Home meds:  Noncompliant - med rec lists:  dapagliflozin 10, glipizide 5, actos 30, dapagliflozin 10 / metformin 1000  HgbA1c 9.2, goal < 7.0  CBGs  SSI 0-15 q4   Dysphagia . Secondary to stroke . NPO . Speech on board . Put on TF @ 40 . Replace tube w/ Cortrak today   Other Stroke Risk Factors  Hx ETOH use, alcohol level <10, advised to drink no more than 2 drink(s) a day  Substance abuse - UDS:  Benzos POSITIVE  Obesity, Body mass index is 27.62 kg/m., recommend weight loss, diet and exercise as appropriate   Other Active Problems  Thrombocytopenia PLT 120-135-118-116-158->188 - resolved  Hypokalemia K 3.3-4.1-3.3-3.4-3.8-2.9->3.3 -> supplement   Urinary retention - I&O cath, foley prn  Code status - Full code    Hospital day # 4  This patient is critically ill due to large left MCA infarct, status post TPA and thrombectomy, uncontrolled diabetes, hypertension/hypotension and at significant risk of neurological worsening, death form recurrent stroke, hemorrhagic conversion, seizure, DKA, heart failure.   This patient is critically ill and at significant risk of neurological worsening, death and care requires constant monitoring of vital signs, hemodynamics,respiratory and cardiac monitoring,review of multiple databases, neurological assessment, discussion with family, other specialists and medical decision making of high complexity.I  I spent 30 minutes of neurocritical care time in the care of this patient.  Naomie Dean, MD [facility]      To contact Stroke Continuity provider, please refer to [email or web address]. After hours, contact General Neurology

## 2020-03-28 NOTE — Evaluation (Signed)
Clinical/Bedside Swallow Evaluation Patient Details  Name: Brad Delacruz MRN: [id number] Date of Birth: 05/24/1961  Today's Date: 03/28/2020 Time: SLP Start Time (ACUTE ONLY): 1108 SLP Stop Time (ACUTE ONLY): 1123 SLP Time Calculation (min) (ACUTE ONLY): 15 min  Past Medical History:  Past Medical History:  Diagnosis Date  . Diabetes mellitus without complication [facility])    Past Surgical History:  Past Surgical History:  Procedure Laterality Date  . IR CT HEAD LTD  03/24/2020  . IR CT HEAD LTD  03/24/2020  . IR INTRA CRAN STENT  03/24/2020  . IR PATIENT EVAL TECH 0-60 MINS  03/25/2020  . IR PERCUTANEOUS ART THROMBECTOMY/INFUSION INTRACRANIAL INC DIAG ANGIO  03/24/2020  . RADIOLOGY WITH ANESTHESIA N/A 03/24/2020   Procedure: IR WITH ANESTHESIA;  Surgeon: Julieanne Cotton, MD;  Location: [facility] OR;  Service: Radiology;  Laterality: N/A;   HPI:  59 y.o. male past medical history of diabetes, hypertension which he does not take medication for, presented to the emergency room at [facility] with last known well of 5 AM when he was at work and was noted to have a sudden onset of right-sided weakness and inability to talk. Pt given TPA and s/p thrombectomy on 4/20. Pt remains intubated and restrained as of 4/22.  PT was intubated from 4/20-4/22.  Head CT reported a large left MCA infarct.     Assessment / Plan / Recommendation Clinical Impression  Pt was seen for a bedside swallow evaluation.  He was encountered asleep in bed with wife and daughter present at bedside.  [employer] video interpreter was used for this evaluation.  Pt roused minimally to max verbal and tactile stimulation, but he remained lethargic throughout.  Pt was able to open his mouth for oral care and he demonstrated spontaneous labial and lingual movements in response to tactile stimulation from the suction swab.  Pt was seen with minimal trials of ice chips and thin liquid via 1/2 tsp following oral care.  Labial closure around  the spoon was decreased and pt did not attempt lingual manipulation or swallow initiation with either trial.  Wet vocal quality was observed following the thin liquid trial and remaining bolus was suctioned from the pt's oral cavity secondary to aspiration concerns.  No additional PO trials were attempted on this date.  Hopeful that pt will be better able to tolerate PO trials when he is more alert.  Recommend continuation of NPO with short-term alternative means of nutrition and frequent oral care at this time.  SLP will f/u to determine readiness for clinical diet initiation vs instrumental swallow study.   SLP Visit Diagnosis: Dysphagia, unspecified (R13.10)    Aspiration Risk  Moderate aspiration risk    Diet Recommendation Alternative means - temporary;NPO   Medication Administration: Via alternative means    Other  Recommendations Oral Care Recommendations: Oral care QID;Staff/trained caregiver to provide oral care   Follow up Recommendations Inpatient Rehab      Frequency and Duration min 2x/week  2 weeks       Prognosis Prognosis for Safe Diet Advancement: Good Barriers to Reach Goals: Language deficits;Severity of deficits      Swallow Study   General HPI: 59 y.o. male past medical history of diabetes, hypertension which he does not take medication for, presented to the emergency room at [facility] with last known well of 5 AM when he was at work and was noted to have a sudden onset of right-sided weakness and inability to talk. Pt  given TPA and s/p thrombectomy on 4/20. Pt remains intubated and restrained as of 4/22.  PT was intubated from 4/20-4/22.  Head CT reported a large left MCA infarct.   Type of Study: Bedside Swallow Evaluation Previous Swallow Assessment: None  Diet Prior to this Study: NPO;NG Tube Temperature Spikes Noted: Yes Respiratory Status: Room air History of Recent Intubation: Yes Length of Intubations (days): 2 days Date extubated:  03/26/20 Behavior/Cognition: Lethargic/Drowsy Oral Cavity Assessment: Within Functional Limits Oral Care Completed by SLP: Yes Oral Cavity - Dentition: Adequate natural dentition Patient Positioning: Upright in bed Baseline Vocal Quality: Not observed Volitional Cough: Cognitively unable to elicit Volitional Swallow: Able to elicit    Oral/Motor/Sensory Function Overall Oral Motor/Sensory Function: (Unable to evaluate )   Ice Chips Ice chips: Impaired Presentation: Spoon Oral Phase Impairments: Poor awareness of bolus Oral Phase Functional Implications: Oral holding Pharyngeal Phase Impairments: Unable to trigger swallow   Thin Liquid Thin Liquid: Impaired Presentation: Spoon Oral Phase Impairments: Poor awareness of bolus Oral Phase Functional Implications: Oral holding Pharyngeal  Phase Impairments: Unable to trigger swallow    Nectar Thick Nectar Thick Liquid: Not tested   Honey Thick Honey Thick Liquid: Not tested   Puree Puree: Not tested   Solid     Solid: Not tested     Colin Mulders M.S., CCC-SLP Acute Rehabilitation Services Office: [phone]  Elvia Collum Aaylah Pokorny 03/28/2020,12:24 PM

## 2020-03-29 ENCOUNTER — Inpatient Hospital Stay (HOSPITAL_COMMUNITY): Payer: BC Managed Care – PPO

## 2020-03-29 DIAGNOSIS — J9601 Acute respiratory failure with hypoxia: Secondary | ICD-10-CM | POA: Diagnosis not present

## 2020-03-29 DIAGNOSIS — J9600 Acute respiratory failure, unspecified whether with hypoxia or hypercapnia: Secondary | ICD-10-CM | POA: Diagnosis not present

## 2020-03-29 LAB — POCT I-STAT 7, (LYTES, BLD GAS, ICA,H+H)
Acid-base deficit: 2 mmol/L (ref 0.0–2.0)
Acid-base deficit: 1 mmol/L (ref 0.0–2.0)
Bicarbonate: 20 mmol/L (ref 20.0–28.0)
Bicarbonate: 25.6 mmol/L (ref 20.0–28.0)
Calcium, Ion: 1.37 mmol/L (ref 1.15–1.40)
Calcium, Ion: 1.37 mmol/L (ref 1.15–1.40)
HCT: 42 % (ref 39.0–52.0)
HCT: 42 % (ref 39.0–52.0)
Hemoglobin: 14.3 g/dL (ref 13.0–17.0)
Hemoglobin: 14.3 g/dL (ref 13.0–17.0)
O2 Saturation: 95 %
O2 Saturation: 100 %
Patient temperature: 100.5
Patient temperature: 100
Potassium: 3.2 mmol/L — ABNORMAL LOW (ref 3.5–5.1)
Potassium: 3 mmol/L — ABNORMAL LOW (ref 3.5–5.1)
Sodium: 166 mmol/L (ref 135–145)
Sodium: 166 mmol/L (ref 135–145)
TCO2: 21 mmol/L — ABNORMAL LOW (ref 22–32)
TCO2: 27 mmol/L (ref 22–32)
pCO2 arterial: 27.8 mmHg — ABNORMAL LOW (ref 32.0–48.0)
pCO2 arterial: 52.7 mmHg — ABNORMAL HIGH (ref 32.0–48.0)
pH, Arterial: 7.469 — ABNORMAL HIGH (ref 7.350–7.450)
pH, Arterial: 7.297 — ABNORMAL LOW (ref 7.350–7.450)
pO2, Arterial: 71 mmHg — ABNORMAL LOW (ref 83.0–108.0)
pO2, Arterial: 314 mmHg — ABNORMAL HIGH (ref 83.0–108.0)

## 2020-03-29 LAB — BASIC METABOLIC PANEL
BUN: 33 mg/dL — ABNORMAL HIGH (ref 6–20)
BUN: 34 mg/dL — ABNORMAL HIGH (ref 6–20)
CO2: 20 mmol/L — ABNORMAL LOW (ref 22–32)
CO2: 21 mmol/L — ABNORMAL LOW (ref 22–32)
Calcium: 9.7 mg/dL (ref 8.9–10.3)
Calcium: 9.5 mg/dL (ref 8.9–10.3)
Chloride: >130 mmol/L (ref 98–111)
Chloride: >130 mmol/L (ref 98–111)
Creatinine, Ser: 0.99 mg/dL (ref 0.61–1.24)
Creatinine, Ser: 1 mg/dL (ref 0.61–1.24)
GFR calc Af Amer: >60 mL/min (ref >60)
GFR calc Af Amer: >60 mL/min (ref >60)
GFR calc non Af Amer: >60 mL/min (ref >60)
GFR calc non Af Amer: >60 mL/min (ref >60)
Glucose, Bld: 214 mg/dL — ABNORMAL HIGH (ref 70–99)
Glucose, Bld: 217 mg/dL — ABNORMAL HIGH (ref 70–99)
Potassium: 3.3 mmol/L — ABNORMAL LOW (ref 3.5–5.1)
Potassium: 3.3 mmol/L — ABNORMAL LOW (ref 3.5–5.1)
Sodium: 163 mmol/L (ref 135–145)
Sodium: 162 mmol/L (ref 135–145)

## 2020-03-29 LAB — GLUCOSE, CAPILLARY
Glucose-Capillary: 190 mg/dL — ABNORMAL HIGH (ref 70–99)
Glucose-Capillary: 200 mg/dL — ABNORMAL HIGH (ref 70–99)
Glucose-Capillary: 225 mg/dL — ABNORMAL HIGH (ref 70–99)
Glucose-Capillary: 226 mg/dL — ABNORMAL HIGH (ref 70–99)
Glucose-Capillary: 248 mg/dL — ABNORMAL HIGH (ref 70–99)
Glucose-Capillary: 249 mg/dL — ABNORMAL HIGH (ref 70–99)
Glucose-Capillary: 275 mg/dL — ABNORMAL HIGH (ref 70–99)

## 2020-03-29 LAB — CBC WITH DIFFERENTIAL/PLATELET
Abs Immature Granulocytes: 0.05 10*3/uL (ref 0.00–0.07)
Basophils Absolute: 0 10*3/uL (ref 0.0–0.1)
Basophils Relative: 0 %
Eosinophils Absolute: 0 10*3/uL (ref 0.0–0.5)
Eosinophils Relative: 0 %
HCT: 48.9 % (ref 39.0–52.0)
Hemoglobin: 16.2 g/dL (ref 13.0–17.0)
Immature Granulocytes: 0 %
Lymphocytes Relative: 5 %
Lymphs Abs: 0.7 10*3/uL (ref 0.7–4.0)
MCH: 30.9 pg (ref 26.0–34.0)
MCHC: 33.1 g/dL (ref 30.0–36.0)
MCV: 93.1 fL (ref 80.0–100.0)
Monocytes Absolute: 1.2 10*3/uL — ABNORMAL HIGH (ref 0.1–1.0)
Monocytes Relative: 9 %
Neutro Abs: 11 10*3/uL — ABNORMAL HIGH (ref 1.7–7.7)
Neutrophils Relative %: 86 %
Platelets: 207 10*3/uL (ref 150–400)
RBC: 5.25 MIL/uL (ref 4.22–5.81)
RDW: 14.2 % (ref 11.5–15.5)
WBC: 13 10*3/uL — ABNORMAL HIGH (ref 4.0–10.5)
nRBC: 0 % (ref 0.0–0.2)

## 2020-03-29 LAB — SODIUM
Sodium: 161 mmol/L (ref 135–145)
Sodium: 161 mmol/L (ref 135–145)

## 2020-03-29 LAB — MAGNESIUM: Magnesium: 2.5 mg/dL — ABNORMAL HIGH (ref 1.7–2.4)

## 2020-03-29 LAB — PROCALCITONIN: Procalcitonin: 0.13 ng/mL

## 2020-03-29 LAB — PHOSPHORUS: Phosphorus: 2.1 mg/dL — ABNORMAL LOW (ref 2.5–4.6)

## 2020-03-29 LAB — LACTIC ACID, PLASMA: Lactic Acid, Venous: 1.7 mmol/L (ref 0.5–1.9)

## 2020-03-29 MED ORDER — CHLORHEXIDINE GLUCONATE 0.12% ORAL RINSE (MEDLINE KIT)
15.0000 mL | Freq: Two times a day (BID) | OROMUCOSAL | Status: DC
  Administered 2020-03-29 – 2020-04-02 (×9): 15 mL via OROMUCOSAL
  Filled 2020-03-29: qty 15

## 2020-03-29 MED ORDER — ORAL CARE MOUTH RINSE
15.0000 mL | OROMUCOSAL | Status: DC
  Administered 2020-03-29 – 2020-04-01 (×28): 15 mL via OROMUCOSAL

## 2020-03-29 MED ORDER — ROCURONIUM BROMIDE 50 MG/5ML IV SOLN
70.0000 mg | Freq: Once | INTRAVENOUS | Status: AC
  Administered 2020-03-29: 70 mg via INTRAVENOUS
  Filled 2020-03-29: qty 7

## 2020-03-29 MED ORDER — INSULIN GLARGINE 100 UNIT/ML ~~LOC~~ SOLN
10.0000 [IU] | Freq: Every day | SUBCUTANEOUS | Status: DC
  Administered 2020-03-29: 10 [IU] via SUBCUTANEOUS
  Filled 2020-03-29 (×3): qty 0.1

## 2020-03-29 MED ORDER — POTASSIUM PHOSPHATES 15 MMOLE/5ML IV SOLN
10.0000 mmol | Freq: Once | INTRAVENOUS | Status: AC
  Administered 2020-03-29: 10 mmol via INTRAVENOUS
  Filled 2020-03-29: qty 3.33

## 2020-03-29 MED ORDER — FENTANYL CITRATE (PF) 100 MCG/2ML IJ SOLN
50.0000 ug | INTRAMUSCULAR | Status: DC | PRN
  Filled 2020-03-29: qty 2

## 2020-03-29 MED ORDER — SODIUM CHLORIDE 0.9 % IV SOLN
1.0000 g | INTRAVENOUS | Status: DC
  Administered 2020-03-29: 1 g via INTRAVENOUS
  Filled 2020-03-29: qty 1

## 2020-03-29 MED ORDER — FREE WATER: 100.0000 mL | Status: DC

## 2020-03-29 MED ORDER — FENTANYL CITRATE (PF) 100 MCG/2ML IJ SOLN
100.0000 ug | Freq: Once | INTRAMUSCULAR | Status: AC
  Administered 2020-03-29: 100 ug via INTRAVENOUS

## 2020-03-29 MED ORDER — ETOMIDATE 2 MG/ML IV SOLN
20.0000 mg | Freq: Once | INTRAVENOUS | Status: AC
  Administered 2020-03-29: 20 mg via INTRAVENOUS

## 2020-03-29 MED ORDER — DOCUSATE SODIUM 50 MG/5ML PO LIQD
100.0000 mg | Freq: Two times a day (BID) | ORAL | Status: DC
  Filled 2020-03-29: qty 10

## 2020-03-29 MED ORDER — MIDAZOLAM HCL 2 MG/2ML IJ SOLN: 2.0000 mg | INTRAMUSCULAR | Status: DC | PRN

## 2020-03-29 MED ORDER — SODIUM CHLORIDE 0.9 % IV BOLUS
500.0000 mL | Freq: Once | INTRAVENOUS | Status: AC
  Administered 2020-03-29: 15:00:00 500 mL via INTRAVENOUS

## 2020-03-29 MED ORDER — FENTANYL CITRATE (PF) 100 MCG/2ML IJ SOLN
50.0000 ug | INTRAMUSCULAR | Status: DC | PRN
  Administered 2020-03-29: 50 ug via INTRAVENOUS

## 2020-03-29 MED ORDER — POTASSIUM CHLORIDE 20 MEQ PO PACK
20.0000 meq | PACK | Freq: Two times a day (BID) | ORAL | Status: DC
  Filled 2020-03-29: qty 1

## 2020-03-29 MED ORDER — INSULIN ASPART 100 UNIT/ML ~~LOC~~ SOLN
0.0000 [IU] | SUBCUTANEOUS | Status: DC
  Administered 2020-03-29: 20:00:00 7 [IU] via SUBCUTANEOUS
  Administered 2020-03-30: 11 [IU] via SUBCUTANEOUS
  Administered 2020-03-30: 7 [IU] via SUBCUTANEOUS
  Administered 2020-03-30: 4 [IU] via SUBCUTANEOUS
  Administered 2020-03-30: 7 [IU] via SUBCUTANEOUS
  Administered 2020-03-30: 11 [IU] via SUBCUTANEOUS
  Administered 2020-03-30: 04:00:00 7 [IU] via SUBCUTANEOUS
  Administered 2020-03-30: 12:00:00 11 [IU] via SUBCUTANEOUS
  Administered 2020-03-31: 3 [IU] via SUBCUTANEOUS
  Administered 2020-03-31 – 2020-04-01 (×8): 4 [IU] via SUBCUTANEOUS
  Administered 2020-04-01: 12:00:00 7 [IU] via SUBCUTANEOUS
  Administered 2020-04-01: 3 [IU] via SUBCUTANEOUS
  Administered 2020-04-02 (×3): 7 [IU] via SUBCUTANEOUS
  Administered 2020-04-02 – 2020-04-03 (×5): 4 [IU] via SUBCUTANEOUS
  Administered 2020-04-03: 13:00:00 3 [IU] via SUBCUTANEOUS
  Administered 2020-04-03: 03:00:00 4 [IU] via SUBCUTANEOUS
  Administered 2020-04-03 (×2): 3 [IU] via SUBCUTANEOUS
  Administered 2020-04-04: 4 [IU] via SUBCUTANEOUS
  Administered 2020-04-04 (×2): 3 [IU] via SUBCUTANEOUS
  Administered 2020-04-04: 01:00:00 4 [IU] via SUBCUTANEOUS
  Administered 2020-04-04 (×2): 3 [IU] via SUBCUTANEOUS
  Administered 2020-04-05 (×2): 4 [IU] via SUBCUTANEOUS
  Administered 2020-04-05 (×3): 3 [IU] via SUBCUTANEOUS
  Administered 2020-04-06: 12:00:00 4 [IU] via SUBCUTANEOUS
  Administered 2020-04-06 – 2020-04-07 (×6): 3 [IU] via SUBCUTANEOUS
  Administered 2020-04-07: 4 [IU] via SUBCUTANEOUS
  Administered 2020-04-07: 3 [IU] via SUBCUTANEOUS
  Administered 2020-04-08 (×2): 4 [IU] via SUBCUTANEOUS
  Administered 2020-04-08: 22:00:00 3 [IU] via SUBCUTANEOUS
  Administered 2020-04-08 (×2): 4 [IU] via SUBCUTANEOUS
  Administered 2020-04-09: 3 [IU] via SUBCUTANEOUS
  Administered 2020-04-09 (×3): 4 [IU] via SUBCUTANEOUS

## 2020-03-29 MED ORDER — FENTANYL CITRATE (PF) 100 MCG/2ML IJ SOLN
INTRAMUSCULAR | Status: AC
  Filled 2020-03-29: qty 2

## 2020-03-29 MED ORDER — MIDAZOLAM HCL 2 MG/2ML IJ SOLN
INTRAMUSCULAR | Status: AC
  Filled 2020-03-29: qty 2

## 2020-03-29 MED ORDER — POLYETHYLENE GLYCOL 3350 17 G PO PACK
17.0000 g | PACK | Freq: Every day | ORAL | Status: DC
  Filled 2020-03-29: qty 1

## 2020-03-29 MED ORDER — POTASSIUM CHLORIDE 20 MEQ PO PACK: 20.0000 meq | PACK | Freq: Two times a day (BID) | ORAL | Status: DC

## 2020-03-29 MED ORDER — PROPOFOL 1000 MG/100ML IV EMUL
0.0000 ug/kg/min | INTRAVENOUS | Status: DC
  Administered 2020-03-29: 17:00:00 10 ug/kg/min via INTRAVENOUS
  Filled 2020-03-29: qty 100

## 2020-03-29 MED ORDER — POTASSIUM CHLORIDE 20 MEQ/15ML (10%) PO SOLN
20.0000 meq | Freq: Two times a day (BID) | ORAL | Status: DC
  Administered 2020-03-29 – 2020-03-30 (×4): 20 meq
  Filled 2020-03-29 (×4): qty 15

## 2020-03-29 MED ORDER — SODIUM CHLORIDE 0.9 % IV SOLN
2.0000 g | Freq: Three times a day (TID) | INTRAVENOUS | Status: DC
  Administered 2020-03-30: 2 g via INTRAVENOUS
  Filled 2020-03-29 (×4): qty 2

## 2020-03-29 MED ORDER — FREE WATER
100.0000 mL | Status: DC
  Administered 2020-03-29 – 2020-03-30 (×10): 100 mL

## 2020-03-29 NOTE — Progress Notes (Signed)
Pharmacy Antibiotic Note  Brad Delacruz is a 59 y.o. male admitted on 03/24/2020 with CVA L MCA s/p tpa and IR for revascularization.  Tm 100.5 elevated wbc13, Cr 1CrCl 31ml/min hypotensive, now intubated.  Pharmacy has been consulted for cefepime dosing.  Plan: Cefepime 2gm IV q8h  Height: 5\' 5"  (165.1 cm) Weight: 75.3 kg (166 lb 0.1 oz) IBW/kg (Calculated) : 61.5  Temp (24hrs), Avg:99.8 F (37.7 C), Min:98.7 F (37.1 C), Max:100.5 F (38.1 C)  Recent Labs  Lab 03/25/20 1000 03/25/20 1000 03/26/20 0556 03/27/20 0706 03/28/20 0529 03/29/20 0419 03/29/20 1114 03/29/20 1332  WBC 9.3  --  11.1* 13.3* 12.8* 13.0*  --   --   CREATININE 0.90   < > 0.96 0.80 0.90 0.99  --  1.00  LATICACIDVEN  --   --   --   --   --   --  1.7  --    < > = values in this interval not displayed.    Estimated Creatinine Clearance: 75.4 mL/min (by C-G formula based on SCr of 1 mg/dL).    No Known Allergies  Antimicrobials this admission:   Dose adjustments this admission:   Microbiology results: 4/25 Bcx 4/25 Sputum Cx  5/25 Pharm.D. CPP, BCPS Clinical Pharmacist [phone] 03/29/2020 5:51 PM

## 2020-03-29 NOTE — Plan of Care (Signed)
  Problem: Pain Managment: Goal: General experience of comfort will improve Outcome: Progressing   

## 2020-03-29 NOTE — Plan of Care (Signed)
  Problem: Education: Goal: Knowledge of disease or condition will improve Outcome: Progressing   

## 2020-03-29 NOTE — Procedures (Signed)
Intubation Procedure Note Brad Delacruz [id number] February 07, 1961  Procedure: Intubation Indications: Respiratory insufficiency  Procedure Details Consent: Risks of procedure as well as the alternatives and risks of each were explained to the (patient/caregiver).  Consent for procedure obtained. Time Out: Verified patient identification, verified procedure, site/side was marked, verified correct patient position, special equipment/implants available, medications/allergies/relevent history reviewed, required imaging and test results available.  Performed  Maximum sterile technique was used including cap, gloves, gown, hand hygiene and mask.  MAC  Mac 3.0 glidescope used -> Prior to this fent, etomidate 33m and roc 719mgiven. GRade 1 view. Easy intubation. Lot of thick mucus around vocal cords.     Evaluation Hemodynamic Status: BP stable throughout; O2 sats: transiently fell during during procedure Patient's Current Condition: stable Complications: No apparent complications Patient did tolerate procedure well. Chest X-ray ordered to verify placement.  CXR: pending.  Plan  - PRVC - ABG =- CXR - trach aspirate  - change abx to cefepmine due to HCAP concern     SIGNATURE    Dr. MuBrand MalesM.D., F.C.C.P,  Pulmonary and Critical Care Medicine Staff Physician, [place] - Interstitial Lung Disease  Program  Pulmonary FiBerwynt [id number]: [phone] no answer or between  15:00h - 7:00h: call [phone] Telephone: [phone]  4:39 PM 03/29/2020

## 2020-03-29 NOTE — Progress Notes (Signed)
STROKE TEAM PROGRESS NOTE   INTERVAL HISTORY No family at the bedside. Today he appears drowsier, overnight developed hypoxemia and tachypnea,   watching edema closely with repeat stat CT head right now.  Still global aphasia and right hemiplegia but withdraws on the right, left preference conjugate gaze, favors the left however can cross midline minimally,  and right neglect.  CT showed large left MCA infarct with more clear demarcation.  Cerebral edema started with midline shift 5 mm.  . On 3% but held due to Na 163. started oral meds via tube. Still pretty drowsy. No sedation. tachypnic on 4L o2 sating at 92%. Stat CT head today and CXR. CCM consulted.  Vitals:   03/29/20 0900 03/29/20 1000 03/29/20 1100 03/29/20 1200  BP: (!) 145/91 (!) 152/83 130/71 105/84  Pulse: 81 91 84 89  Resp:  (!) 31 (!) 27 (!) 27  Temp:  98.7 F (37.1 C)  100 F (37.8 C)  TempSrc:  Axillary  Axillary  SpO2: 93% 93% 97% 93%  Weight:      Height:        CBC:  Recent Labs  Lab 03/28/20 0529 03/28/20 0529 03/29/20 0419 03/29/20 1113  WBC 12.8*  --  13.0*  --   NEUTROABS 11.1*  --  11.0*  --   HGB 15.5   < > 16.2 14.3  HCT 45.2   < > 48.9 42.0  MCV 90.8  --  93.1  --   PLT 188  --  207  --    < > = values in this interval not displayed.    Basic Metabolic Panel:  Recent Labs  Lab 03/28/20 0529 03/28/20 1040 03/29/20 0419 03/29/20 0419 03/29/20 1014 03/29/20 1113  NA 154*   < > 163*   < > 161* 166*  K 3.3*  --  3.3*  --   --  3.2*  CL 119*  --  >130*  --   --   --   CO2 20*  --  20*  --   --   --   GLUCOSE 194*  --  214*  --   --   --   BUN 21*  --  33*  --   --   --   CREATININE 0.90  --  0.99  --   --   --   CALCIUM 9.9  --  9.7  --   --   --   MG 2.6*  --  2.5*  --   --   --   PHOS 2.2*  --  2.1*  --   --   --    < > = values in this interval not displayed.   Lipid Panel:     Component Value Date/Time   CHOL 180 03/25/2020 0307   TRIG 269 (H) 03/26/2020 0556   HDL 21 (L)  03/25/2020 0307   CHOLHDL 8.6 03/25/2020 0307   VLDL UNABLE TO CALCULATE IF TRIGLYCERIDE OVER 400 mg/dL 74/11/8785 7672   LDLCALC UNABLE TO CALCULATE IF TRIGLYCERIDE OVER 400 mg/dL 09/47/0962 8366   QHUT6L:  Lab Results  Component Value Date   HGBA1C 9.2 (H) 03/24/2020   Urine Drug Screen:     Component Value Date/Time   LABOPIA NONE DETECTED 03/25/2020 0229   COCAINSCRNUR NONE DETECTED 03/25/2020 0229   LABBENZ POSITIVE (A) 03/25/2020 0229   AMPHETMU NONE DETECTED 03/25/2020 0229   THCU NONE DETECTED 03/25/2020 0229   LABBARB NONE DETECTED 03/25/2020 0229  Alcohol Level     Component Value Date/Time   ETH <10 03/24/2020 0601    IMAGING past 24 hours  CT HEAD WO CONTRAST 03/28/2020 IMPRESSION:  1. Stable from the CT yesterday: Large left MCA infarct with cytotoxic edema with 5 mm of rightward midline shift. Basilar cisterns remain patent. Subarachnoid blood and/or contrast in and around the left Sylvian fissure.  2. No new intracranial abnormality.   CT HEAD WO CONTRAST 03/27/2020   IMPRESSION:  1. Continued evolution of large Left MCA infarct with increased intracranial mass effect since 03/26/2019. Rightward midline shift now 5-6 mm. Basilar cisterns remain patent.  2. Stable left hemisphere subarachnoid hemorrhage and/or contrast, including trace blood layering along the midline tentorium.  3. No new intracranial abnormality.    CT Head WO Contrast 03/29/2020 IMPRESSION: 1. Sequelae of large Left MCA territory infarct remain stable since 03/27/2020. Rightward midline shift remains 5 mm. 2. No new intracranial abnormality.    PHYSICAL EXAM   Temp:  [98.7 F (37.1 C)-100.5 F (38.1 C)] 100 F (37.8 C) (04/25 1200) Pulse Rate:  [81-130] 89 (04/25 1200) Resp:  [21-39] 27 (04/25 1200) BP: (105-170)/(71-101) 105/84 (04/25 1200) SpO2:  [89 %-97 %] 93 % (04/25 1200) Weight:  [75.3 kg] 75.3 kg (04/25 0500)  General - Well nourished, well developed, tachypnic on 4L  o2 sating at 92%  Ophthalmologic - fundi not visualized due to noncooperation.  Cardiovascular - Regular rate and rhythm.  Lungs: decreased breath sounds and rhonichi  Neuro - drowsy, openes eyes to stim, global aphasia, not following commands, not verbal. Eyes left gaze position, able to cross midline , not blinking to visual threat on the right, blinking to threat on the left,  able to track on the left but not right. Mild lower right facial droop.  Tongue protrusion not cooperative. Spontaneous movement of LUE and LLE, LUE at least 4/5, no drift when lifted in air. LLE 3/5 with pain stimulation. RUE flaccid, right-sided neglect, RLE slight withdraw to pain. DTR 1+. Right upgoing toe, left downgoing toe, . Sensation, coordination and gait not tested.   ASSESSMENT/PLAN [name] is a 59 y.o. male with history of HTN and DB noncompliant w/ medications presenting to [facility] with R sided weakness and inability to talk. NIH 28. CTA showed L M1 occlusion. Transferred to [facility]. On arrival, received IV tPA 03/24/2020 at 0725 followed by mechanical thrombectomy w/ TICI2C revascularization and rescue stent placement.  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 Old L corona radiata and internal capsule infarcts. ASPECTS 10.     CTA head & neck large L MCA territory infarct. L M1 LVO. Collateral vessels in sylvian fissure. B ICA bifurcation atherosclerosis. Aortic atherosclerosis.   CT perfusion large L MCA penumbra.   Cerebral angio L MCA M1 occlusion w/ TICI2c revascularization x 6 passes. Rescue stenting for reocclusion   CT head 4/21 - evolving large left MCA infarct, trace midline shift, persistent high density and left C1 fissure, contrast versus  blood  MRI  Large L MCA infarct evolution w/ edema and mass effect w/ 53mm L midline shift. SAH L sylvian fissure, L cerebral cortical sulci and basilar cisterns.  MRA  L M1 stent w/o flow c/w reocclusion. L ICA  filling defect c/w small thrombus. VBJ w/ infundibulum vs aneurysm.  CT head 4/23 continued evolution large L MCA infarct w/ increased edema, now w/ 5-86mm midline shift. Stable L SAH w/ trace blood in tentorium.    CT head 4/24 - stable 5 mm shift. Large Lt MCA infarct. Subarachnoid blood and/or contrast in and around the left Sylvian fissure.   CT Head 03/29/2020 - Sequelae of large Left MCA territory infarct remain stable since 03/27/2020. Rightward midline shift remains 5 mm.  2D Echo EF 55-60%. No source of embolus   LE doppler neg DVT  May consider TEE and loop recorder if neuro improvement later. Defer to stroke team Monday if hypercoag panel to be ordered inpatient.  LDL 55.1  TG 798->675->269 (on 4/22)   HgbA1c 9.2  SCDs for VTE prophylaxis  No antithrombotic prior to admission, now on aspirin 81 mg daily and Brilinta (ticagrelor) 90 mg bid given stent placement and L tICA thrombus.   Therapy recommendations:  SNF - possible CIR (consult pending)  Disposition:  pending   Cerebral edema  CT showed large left MCA infarct with minimal midline shift  MRI  Large L MCA infarct evolution w/ edema and mass effect w/ 74mm L midline shift.   CT head 4/23 continued evolution large L MCA infarct w/ increased edema, now w/ 5-61mm midline shift. Stable L SAH w/ trace blood in tentorium.   CT head 4/25 stable  On 3% saline @ 75 via peripheral line - may consider central line if needed - held due to hyperglycemia  Goal Na 150-155  Na 143->145->154->163->161->166  CT head 4/24 - stable (see above)  Based on CT 4/24, may need to consult NSG for possible hemicrani: Recommendations for elevated Na now 166 -Add free water flushes-100 cc every 4 hours -> now to every 2 hours  for 1 day -Stop 3% -> done -Start normal saline at 100 cc an hour -> done -Give a bolus of normal saline 500 cc now -> done -> repeat today -Recheck sodium in 6 hours-> ordered every 6 hours - CCM consulted for assistance with fluid/electrolyte status and tachypnea this morning  Acute Respiratory Failure   Intubated for IR, left intubated post IR for airway protection secondary to acute L MCA stroke  Extubation 4/22  CCM on board, re-consulted today (Sunday)  Tolerating well so far  Close monitoring - high risk for neuro decline and reintubation, tachypnea Sunday  Hypertensive Emergency  SBP > 210 on arrival  Home meds:  Non-compliant - med rec lists:  losartan 25   BP variable during the night w/ agitation  Cleviprex changed to cardene d/t elevated TG -> now off  BP goal < 160 given SAH -> resume home dose of Cozaar 25 mg daily.  Hydralazine and labetalol PRN . Long-term BP goal normotensive  Fever  Leukocytosis WBC 11.3->9.3->11.1->13.3->12.8->13.0  Temp - 100.1->99.3->100.3->99.7  Will order CXR - 4/25 - Low lung volumes without radiographic evidence of acute cardiopulmonary disease.  Will order U/A - C/w UTI - start IV Rocephin  Blood cultures - pending  Urine Culture - pending   Hyperlipidemia  Home meds:  Non-compliant - med rec lists:  pravachol 10, crestor 5  LDL 55.1, goal < 70  TG 798->675-> 269 (on 4/22)  On lipitor 80  Continue statin at discharge  Diabetes type II Uncontrolled  Home meds:  Noncompliant - med rec lists:  dapagliflozin 10, glipizide 5, actos 30, dapagliflozin 10 / metformin 1000  HgbA1c 9.2,  goal < 7.0  CBGs  SSI 0-15 q4   Dysphagia . Secondary to stroke . NPO . Speech on board . Put on TF @ 40 . Replace tube w/ Cortrak today   Other Stroke Risk Factors  Hx ETOH use, alcohol level <10, advised to drink no more than 2 drink(s) a day  Substance abuse - UDS:  Benzos POSITIVE  Obesity, Body mass index is 27.62  kg/m., recommend weight loss, diet and exercise as appropriate   Other Active Problems  Thrombocytopenia PLT 120-135-118-116-158->188->207 - resolved  Hypokalemia K 3.3-4.1-3.3-3.4-3.8-2.9->3.3 ->3.2 supplement   Urinary retention - I&O cath, foley prn  Na - 166 - see above  Code status - Full code    Hospital day # 5  This patient is critically ill due to large left MCA infarct, status post TPA and thrombectomy, uncontrolled diabetes, hypertension/hypotension and at significant risk of neurological worsening, death form recurrent stroke, hemorrhagic conversion, seizure, DKA, heart failure. May consider TEE and loop recorder if neuro improvement later. Defer to stroke team Monday if hypercoag panel to be ordered inpatient.  This patient is critically ill and at significant risk of neurological worsening, death and care requires constant monitoring of vital signs, hemodynamics,respiratory and cardiac monitoring,review of multiple databases, neurological assessment, discussion with family, other specialists and medical decision making of high complexity.I  I spent 35 minutes of neurocritical care time in the care of this patient.        To contact Stroke Continuity provider, please refer to [email or web address] After hours, contact General Neurology

## 2020-03-29 NOTE — Progress Notes (Signed)
CRITICAL VALUE ALERT  Critical Value:  163 NA, >130Cl, 3.3 K+  Date & Time Notied:  4/25 0541  Provider Notified: Otelia Limes  Orders Received/Actions taken: See [facility]

## 2020-03-29 NOTE — Progress Notes (Signed)
NAME:  Brad Delacruz, MRN:  [id number], DOB:  November 17, 1961, LOS: 5 ADMISSION DATE:  03/24/2020, CONSULTATION DATE:  03/24/20 REFERRING MD:  Estanislado Pandy, CHIEF COMPLAINT:  Vent management   Brief History   58yo male with hx DM, HTN presented 4/20 to [facility] with sudden onset R sided weakness and aphasia.  SBP >200.  CT revealed dense L MCA occlusion.  He was tx urgently to [facility], IV tPA given and underwent IR revascularization of occluded L MCA.  Pt left intubated post procedure and PCCM consulted for vent and ICU management.   Past Medical History  DM  Significant Hospital Events   4/20: Admitted  4/20: S/p tPa and intracranial thrombectomy - rescue stenting of left MCA M1 segment occlusion achieving a TICI 2C revascularization 03/24/2020 by Dr. Estanislado Pandy.  Consults:  PCCM  Procedures:  4/20: Left MCA revascularization  4.21- Difficulties with BP control overnight. Agitated. Started on versed 10 mg/hr.  4/22 -Extubated  4/23 > Worsening midline shift to 5-37mm and 3% saline started by neuro via PIV  Significant Diagnostic Tests:  CTA head 4/20>  Large left MCA territory nonhemorrhagic infarct. Acute left M1 large vessel occlusion. Large penumbra with mismatch volume of 99 mL. Collateral vessels are present within the sylvian fissure. CT head 4/23 1. Continued evolution of large Left MCA infarct with increased intracranial mass effect since 03/26/2019. Rightward midline shift now 5-6 mm. Basilar cisterns remain patent. 2. Stable left hemisphere subarachnoid hemorrhage and/or contrast, including trace blood layering along the midline tentorium.3. No new intracranial abnormality. CT Head 4/25: 1. Sequelae of large Left MCA territory infarct remain stable since 03/27/2020. Rightward midline shift remains 5 mm. 2. No new intracranial abnormality.  Micro Data:  4/20: SARS COVID negative   Antimicrobials:  None   Interim history/subjective:  Worsening mentation this AM   Objective    Blood pressure 105/84, pulse 89, temperature 98.7 F (37.1 C), temperature source Axillary, resp. rate (!) 27, height 5\' 5"  (1.651 m), weight 75.3 kg, SpO2 93 %.        Intake/Output Summary (Last 24 hours) at 03/29/2020 1323 Last data filed at 03/29/2020 1200 Gross per 24 hour  Intake 3590.71 ml  Output 2150 ml  Net 1440.71 ml   Filed Weights   03/27/20 0500 03/28/20 0457 03/29/20 0500  Weight: 80.3 kg 75.3 kg 75.3 kg   Examintion General Appearance:  Adult male, no distress  HEENT: Dry MM Lungs: Rhonchi to upper, no crackles, wheeze  Heart:  RRR, no MRG Abdomen:  Obese, soft, active bowel sounds  Genitalia / Rectal:  Intact  Extremities:  -edema  Skin: intact  Neurologic: non-verbal, opens eyes to physical stimulation, does not follow commands, denise hemiplegia right side Gag+/Weak Cough    Resolved Hospital Problem list   None  Assessment & Plan:   Acute L MCA stroke - s/p IV tPA and IR revascularization with rightward midline shift   Plan - Per stroke team  - Hypertonic on hold due to hypernatremia > Currently on Normal Saline >> Trend Sodium q6h   Acute respiratory failure/insufficeny in setting of above - post neuro IR in setting L MCA stroke / s/p extubation 4/22 Plan -Family discussing GOC and wishes regarding intubation  -Titrate Supplemental Oxygen to maintain Saturation >92   HTN  -cleviprex gtt required on arrival Plan  - Cardiac Monitoring  - SBP goal <160  - Continue home Cozaar, PRN Labetalol    Febrile with uptrending WBC  Plan -Trend WBC and  Fever Curve  -PAN Culture this AM >> Follow Culture Data  -Trend LA and PCT  Hx Type 2 DM  - CBG goal < 180  - Improving with SSI, continue to monitor   Dysphagia  Plan -Continue TF per [place]  Best practice:  Diet: NPO DVT prophylaxis: SCD's GI prophylaxis: pepcid Glucose control: SSI Mobility: BR Code Status: full Family Communication: Updated Wife and Daughter. Currently discussing  GOC Disposition: icu 4N  Jovita Kussmaul, AGACNP-BC [facility]  Pgr: [phone]  PCCM Pgr: [phone]

## 2020-03-30 ENCOUNTER — Inpatient Hospital Stay (HOSPITAL_COMMUNITY): Payer: BC Managed Care – PPO

## 2020-03-30 ENCOUNTER — Inpatient Hospital Stay: Payer: Self-pay

## 2020-03-30 DIAGNOSIS — E119 Type 2 diabetes mellitus without complications: Secondary | ICD-10-CM | POA: Diagnosis not present

## 2020-03-30 DIAGNOSIS — M19011 Primary osteoarthritis, right shoulder: Secondary | ICD-10-CM | POA: Diagnosis present

## 2020-03-30 DIAGNOSIS — E1165 Type 2 diabetes mellitus with hyperglycemia: Secondary | ICD-10-CM | POA: Diagnosis not present

## 2020-03-30 DIAGNOSIS — I69320 Aphasia following cerebral infarction: Secondary | ICD-10-CM | POA: Diagnosis not present

## 2020-03-30 DIAGNOSIS — M7541 Impingement syndrome of right shoulder: Secondary | ICD-10-CM | POA: Diagnosis not present

## 2020-03-30 DIAGNOSIS — I69391 Dysphagia following cerebral infarction: Secondary | ICD-10-CM | POA: Diagnosis not present

## 2020-03-30 DIAGNOSIS — R339 Retention of urine, unspecified: Secondary | ICD-10-CM | POA: Diagnosis present

## 2020-03-30 DIAGNOSIS — I63512 Cerebral infarction due to unspecified occlusion or stenosis of left middle cerebral artery: Secondary | ICD-10-CM | POA: Diagnosis not present

## 2020-03-30 DIAGNOSIS — I69351 Hemiplegia and hemiparesis following cerebral infarction affecting right dominant side: Secondary | ICD-10-CM | POA: Diagnosis not present

## 2020-03-30 DIAGNOSIS — T380X5A Adverse effect of glucocorticoids and synthetic analogues, initial encounter: Secondary | ICD-10-CM | POA: Diagnosis present

## 2020-03-30 DIAGNOSIS — M25512 Pain in left shoulder: Secondary | ICD-10-CM | POA: Diagnosis not present

## 2020-03-30 DIAGNOSIS — I639 Cerebral infarction, unspecified: Secondary | ICD-10-CM | POA: Diagnosis not present

## 2020-03-30 DIAGNOSIS — Q211 Atrial septal defect: Secondary | ICD-10-CM | POA: Diagnosis not present

## 2020-03-30 DIAGNOSIS — J9600 Acute respiratory failure, unspecified whether with hypoxia or hypercapnia: Secondary | ICD-10-CM | POA: Diagnosis not present

## 2020-03-30 DIAGNOSIS — I10 Essential (primary) hypertension: Secondary | ICD-10-CM | POA: Diagnosis not present

## 2020-03-30 DIAGNOSIS — E785 Hyperlipidemia, unspecified: Secondary | ICD-10-CM | POA: Diagnosis not present

## 2020-03-30 DIAGNOSIS — Z794 Long term (current) use of insulin: Secondary | ICD-10-CM | POA: Diagnosis not present

## 2020-03-30 DIAGNOSIS — I6602 Occlusion and stenosis of left middle cerebral artery: Secondary | ICD-10-CM | POA: Diagnosis not present

## 2020-03-30 DIAGNOSIS — Z6829 Body mass index (BMI) 29.0-29.9, adult: Secondary | ICD-10-CM | POA: Diagnosis not present

## 2020-03-30 DIAGNOSIS — R1011 Right upper quadrant pain: Secondary | ICD-10-CM | POA: Diagnosis present

## 2020-03-30 DIAGNOSIS — I69392 Facial weakness following cerebral infarction: Secondary | ICD-10-CM | POA: Diagnosis not present

## 2020-03-30 DIAGNOSIS — R079 Chest pain, unspecified: Secondary | ICD-10-CM | POA: Diagnosis present

## 2020-03-30 DIAGNOSIS — I69398 Other sequelae of cerebral infarction: Secondary | ICD-10-CM | POA: Diagnosis not present

## 2020-03-30 DIAGNOSIS — E669 Obesity, unspecified: Secondary | ICD-10-CM | POA: Diagnosis not present

## 2020-03-30 DIAGNOSIS — E870 Hyperosmolality and hypernatremia: Secondary | ICD-10-CM | POA: Diagnosis not present

## 2020-03-30 DIAGNOSIS — Z79899 Other long term (current) drug therapy: Secondary | ICD-10-CM | POA: Diagnosis not present

## 2020-03-30 DIAGNOSIS — Z7982 Long term (current) use of aspirin: Secondary | ICD-10-CM | POA: Diagnosis not present

## 2020-03-30 DIAGNOSIS — E876 Hypokalemia: Secondary | ICD-10-CM | POA: Diagnosis not present

## 2020-03-30 LAB — CBC WITH DIFFERENTIAL/PLATELET
Abs Immature Granulocytes: 0.06 10*3/uL (ref 0.00–0.07)
Basophils Absolute: 0.1 10*3/uL (ref 0.0–0.1)
Basophils Relative: 1 %
Eosinophils Absolute: 0 10*3/uL (ref 0.0–0.5)
Eosinophils Relative: 0 %
HCT: 48.2 % (ref 39.0–52.0)
Hemoglobin: 15.8 g/dL (ref 13.0–17.0)
Immature Granulocytes: 1 %
Lymphocytes Relative: 13 %
Lymphs Abs: 1.4 10*3/uL (ref 0.7–4.0)
MCH: 31.5 pg (ref 26.0–34.0)
MCHC: 32.8 g/dL (ref 30.0–36.0)
MCV: 96.2 fL (ref 80.0–100.0)
Monocytes Absolute: 1 10*3/uL (ref 0.1–1.0)
Monocytes Relative: 9 %
Neutro Abs: 8.8 10*3/uL — ABNORMAL HIGH (ref 1.7–7.7)
Neutrophils Relative %: 76 %
Platelets: 205 10*3/uL (ref 150–400)
RBC: 5.01 MIL/uL (ref 4.22–5.81)
RDW: 14.7 % (ref 11.5–15.5)
WBC: 11.4 10*3/uL — ABNORMAL HIGH (ref 4.0–10.5)
nRBC: 0 % (ref 0.0–0.2)

## 2020-03-30 LAB — BASIC METABOLIC PANEL
BUN: 45 mg/dL — ABNORMAL HIGH (ref 6–20)
BUN: 40 mg/dL — ABNORMAL HIGH (ref 6–20)
CO2: 22 mmol/L (ref 22–32)
CO2: 19 mmol/L — ABNORMAL LOW (ref 22–32)
Calcium: 8.5 mg/dL — ABNORMAL LOW (ref 8.9–10.3)
Calcium: 8 mg/dL — ABNORMAL LOW (ref 8.9–10.3)
Chloride: >130 mmol/L (ref 98–111)
Chloride: >130 mmol/L (ref 98–111)
Creatinine, Ser: 1.61 mg/dL — ABNORMAL HIGH (ref 0.61–1.24)
Creatinine, Ser: 1.47 mg/dL — ABNORMAL HIGH (ref 0.61–1.24)
GFR calc Af Amer: 53 mL/min — ABNORMAL LOW (ref >60)
GFR calc Af Amer: 60 mL/min — ABNORMAL LOW (ref >60)
GFR calc non Af Amer: 46 mL/min — ABNORMAL LOW (ref >60)
GFR calc non Af Amer: 51 mL/min — ABNORMAL LOW (ref >60)
Glucose, Bld: 249 mg/dL — ABNORMAL HIGH (ref 70–99)
Glucose, Bld: 292 mg/dL — ABNORMAL HIGH (ref 70–99)
Potassium: 3.4 mmol/L — ABNORMAL LOW (ref 3.5–5.1)
Potassium: 3.7 mmol/L (ref 3.5–5.1)
Sodium: 162 mmol/L (ref 135–145)
Sodium: 160 mmol/L — ABNORMAL HIGH (ref 135–145)

## 2020-03-30 LAB — MAGNESIUM: Magnesium: 2.3 mg/dL (ref 1.7–2.4)

## 2020-03-30 LAB — GLUCOSE, CAPILLARY
Glucose-Capillary: 200 mg/dL — ABNORMAL HIGH (ref 70–99)
Glucose-Capillary: 239 mg/dL — ABNORMAL HIGH (ref 70–99)
Glucose-Capillary: 240 mg/dL — ABNORMAL HIGH (ref 70–99)
Glucose-Capillary: 247 mg/dL — ABNORMAL HIGH (ref 70–99)
Glucose-Capillary: 251 mg/dL — ABNORMAL HIGH (ref 70–99)
Glucose-Capillary: 258 mg/dL — ABNORMAL HIGH (ref 70–99)

## 2020-03-30 LAB — OSMOLALITY, URINE: Osmolality, Ur: 995 mOsm/kg — ABNORMAL HIGH (ref 300–900)

## 2020-03-30 LAB — SODIUM
Sodium: 161 mmol/L (ref 135–145)
Sodium: 161 mmol/L (ref 135–145)

## 2020-03-30 LAB — URINE CULTURE: Culture: 30000 — AB

## 2020-03-30 LAB — PROCALCITONIN: Procalcitonin: 0.85 ng/mL

## 2020-03-30 LAB — SODIUM, URINE, RANDOM: Sodium, Ur: 14 mmol/L

## 2020-03-30 LAB — TRIGLYCERIDES: Triglycerides: 119 mg/dL (ref <150)

## 2020-03-30 LAB — PHOSPHORUS: Phosphorus: 2 mg/dL — ABNORMAL LOW (ref 2.5–4.6)

## 2020-03-30 MED ORDER — POLYETHYLENE GLYCOL 3350 17 G PO PACK
17.0000 g | PACK | Freq: Every day | ORAL | Status: DC
  Administered 2020-03-30: 17 g

## 2020-03-30 MED ORDER — FREE WATER
150.0000 mL | Status: DC
  Administered 2020-03-30 – 2020-03-31 (×8): 150 mL

## 2020-03-30 MED ORDER — FENTANYL CITRATE (PF) 100 MCG/2ML IJ SOLN: 12.5000 ug | INTRAMUSCULAR | Status: DC | PRN

## 2020-03-30 MED ORDER — POTASSIUM PHOSPHATES 15 MMOLE/5ML IV SOLN
30.0000 mmol | Freq: Once | INTRAVENOUS | Status: AC
  Administered 2020-03-30: 11:00:00 30 mmol via INTRAVENOUS
  Filled 2020-03-30: qty 10

## 2020-03-30 MED ORDER — INSULIN GLARGINE 100 UNIT/ML ~~LOC~~ SOLN
20.0000 [IU] | Freq: Two times a day (BID) | SUBCUTANEOUS | Status: DC
  Administered 2020-03-30: 22:00:00 20 [IU] via SUBCUTANEOUS
  Filled 2020-03-30 (×3): qty 0.2

## 2020-03-30 MED ORDER — DOCUSATE SODIUM 50 MG/5ML PO LIQD
100.0000 mg | Freq: Two times a day (BID) | ORAL | Status: DC
  Administered 2020-03-30: 100 mg

## 2020-03-30 MED ORDER — INSULIN ASPART 100 UNIT/ML ~~LOC~~ SOLN
3.0000 [IU] | SUBCUTANEOUS | Status: DC
  Administered 2020-03-30 – 2020-03-31 (×5): 3 [IU] via SUBCUTANEOUS

## 2020-03-30 MED ORDER — FENTANYL 2500MCG IN NS 250ML (10MCG/ML) PREMIX INFUSION
0.0000 ug/h | INTRAVENOUS | Status: DC
  Administered 2020-03-30: 02:00:00 25 ug/h via INTRAVENOUS
  Filled 2020-03-30: qty 250

## 2020-03-30 MED ORDER — SODIUM CHLORIDE 0.9 % IV SOLN
2.0000 g | Freq: Two times a day (BID) | INTRAVENOUS | Status: DC
  Administered 2020-03-30 – 2020-03-31 (×2): 2 g via INTRAVENOUS
  Filled 2020-03-30 (×3): qty 2

## 2020-03-30 MED ORDER — PHENYLEPHRINE HCL-NACL 10-0.9 MG/250ML-% IV SOLN
0.0000 ug/min | INTRAVENOUS | Status: DC
  Administered 2020-03-30: 05:00:00 130 ug/min via INTRAVENOUS
  Administered 2020-03-30: 20 ug/min via INTRAVENOUS
  Administered 2020-03-30: 09:00:00 113.3333 ug/min via INTRAVENOUS
  Administered 2020-03-30: 03:00:00 100 ug/min via INTRAVENOUS
  Administered 2020-03-30: 120 ug/min via INTRAVENOUS
  Administered 2020-03-30: 04:00:00 130 ug/min via INTRAVENOUS
  Administered 2020-03-31: 03:00:00 30 ug/min via INTRAVENOUS
  Filled 2020-03-30: qty 250
  Filled 2020-03-30: qty 500
  Filled 2020-03-30 (×4): qty 250

## 2020-03-30 MED ORDER — DEXMEDETOMIDINE HCL IN NACL 400 MCG/100ML IV SOLN
0.2000 ug/kg/h | INTRAVENOUS | Status: DC
  Administered 2020-03-30: 0.4 ug/kg/h via INTRAVENOUS
  Filled 2020-03-30 (×2): qty 100

## 2020-03-30 NOTE — Progress Notes (Signed)
Patient currently with ventilatory support.  We will continue to follow for upcoming rehabilitation needs

## 2020-03-30 NOTE — Progress Notes (Signed)
CRITICAL VALUE ALERT  Critical Value:  Chloride >130  Date & Time Notied:  03/30/20 1720  Provider Notified: CCM   Orders Received/Actions taken: No new orders at this time. Will continue to monitor  Jaclyn Shaggy RN

## 2020-03-30 NOTE — Progress Notes (Signed)
Spoke to primary RN re: PICC order, patient has temp of 101 F and pending blood culture. Not safe to place PICC at this time, will follow up.

## 2020-03-30 NOTE — Progress Notes (Signed)
STROKE TEAM PROGRESS NOTE   INTERVAL HISTORY Patient remains sedated and intubated.  He was agitated requiring sedation last night.  His wife and daughter is at the bedside and daughter translates for her mom.  Patient is aphasic but followed few simple commands is purposeful left-sided movement but is hemiplegic on the right.  Continues to have fever and has been started on cefepime for presumed aspiration.  He remains on ventilatory support for respiratory failure blood pressure adequately controlled CT scan of the head yesterday shows stable mild midline shift and large left MCA infarct.  Echocardiogram shows no significant cardiac source of embolism.  Lower extremity venous Dopplers were negative.  Urine drug screen was negative.  Serum sodium is elevated 162 despite the hypertonic saline being off.  He has been started on free water.  Serum creatinine is elevated today at 1.61 Vitals:   03/30/20 0800 03/30/20 0841 03/30/20 0900 03/30/20 1000  BP: (!) 145/73 (!) 157/78 128/72 133/68  Pulse: 88 (!) 104 95 94  Resp: (!) 23 (!) 26 (!) 23   Temp: (!) 101.4 F (38.6 C)     TempSrc: Axillary     SpO2: 99% 100% 98% 99%  Weight:      Height:        CBC:  Recent Labs  Lab 03/29/20 0419 03/29/20 1113 03/29/20 1727 03/30/20 0418  WBC 13.0*  --   --  11.4*  NEUTROABS 11.0*  --   --  8.8*  HGB 16.2   < > 14.3 15.8  HCT 48.9   < > 42.0 48.2  MCV 93.1  --   --  96.2  PLT 207  --   --  205   < > = values in this interval not displayed.    Basic Metabolic Panel:  Recent Labs  Lab 03/29/20 0419 03/29/20 1014 03/29/20 1332 03/29/20 1332 03/29/20 1727 03/29/20 1727 03/29/20 2145 03/30/20 0418  NA 163*   < > 162*   < > 166*   < > 161* 162*  K 3.3*   < > 3.3*   < > 3.0*  --   --  3.4*  CL >130*  --  >130*  --   --   --   --  >130*  CO2 20*  --  21*  --   --   --   --  22  GLUCOSE 214*  --  217*  --   --   --   --  249*  BUN 33*  --  34*  --   --   --   --  45*  CREATININE 0.99  --   1.00  --   --   --   --  1.61*  CALCIUM 9.7  --  9.5  --   --   --   --  8.5*  MG 2.5*  --   --   --   --   --   --  2.3  PHOS 2.1*  --   --   --   --   --   --  2.0*   < > = values in this interval not displayed.   IMAGING past 24 hours DG CHEST PORT 1 VIEW  Result Date: 03/30/2020 CLINICAL DATA:  Hypoxia EXAM: PORTABLE CHEST 1 VIEW COMPARISON:  March 29, 2020 FINDINGS: Endotracheal tube tip is 2.9 cm above the carina. Feeding tube tip is below the diaphragm. No pneumothorax. There is mild left base atelectasis. Lungs  elsewhere are clear. Heart size and pulmonary vascular normal. No adenopathy. No bone lesions. IMPRESSION: Tube positions as described without pneumothorax. Mild left base atelectasis. Lungs otherwise clear. Heart size normal. Electronically Signed   By: Bretta Bang III M.D.   On: 03/30/2020 07:59   DG CHEST PORT 1 VIEW  Result Date: 03/29/2020 CLINICAL DATA:  59 year old male-intubation. EXAM: PORTABLE CHEST 1 VIEW COMPARISON:  03/29/2020 and prior exams FINDINGS: An endotracheal tube is now identified with tip 2.5 cm above the carina. A small bore feeding tube enters the stomach with tip off the field of view. Mild LEFT basilar atelectasis is again noted. The cardiomediastinal silhouette is unchanged. No pleural effusion or pneumothorax. IMPRESSION: Endotracheal tube with tip 2.5 cm above the carina. No other significant change. Electronically Signed   By: Harmon Pier M.D.   On: 03/29/2020 17:00   DG CHEST PORT 1 VIEW  Result Date: 03/29/2020 CLINICAL DATA:  59 year old male with history of hypoxemia. EXAM: PORTABLE CHEST 1 VIEW COMPARISON:  Chest x-ray 03/28/2020. FINDINGS: A feeding tube is seen extending into the abdomen, however, the tip of the feeding tube extends below the lower margin of the image. Linear scarring or subsegmental atelectasis in the right mid lung. Lung volumes are low. No consolidative airspace disease. No pleural effusions. No pneumothorax. No  pulmonary nodule or mass noted. Pulmonary vasculature and the cardiomediastinal silhouette are within normal limits. IMPRESSION: Low lung volumes without radiographic evidence of acute cardiopulmonary disease. Electronically Signed   By: Trudie Reed M.D.   On: 03/29/2020 11:07   [name] EKG SITE RITE  Result Date: 03/30/2020 If Site Rite image not attached, placement could not be confirmed due to current cardiac rhythm.    PHYSICAL EXAM    Temp:  [99 F (37.2 C)-101.4 F (38.6 C)] 101.4 F (38.6 C) (04/26 0800) Pulse Rate:  [77-123] 94 (04/26 1000) Resp:  [16-40] 23 (04/26 0900) BP: (87-157)/(56-86) 133/68 (04/26 1000) SpO2:  [88 %-100 %] 99 % (04/26 1000) FiO2 (%):  [40 %-100 %] 40 % (04/26 0841)  Obese middle-aged Hispanic male who is intubated and sedated.  In mild respiratory distress. . Afebrile. Head is nontraumatic. Neck is supple without bruit.    Cardiac exam no murmur or gallop. Lungs are clear to auscultation. Distal pulses are well felt.  Neurological Exam-intubated and sedated.  Opens eyes to stim, global aphasia, follows only occasional gaze to the left and up and down but not to the right.  Eyes left gaze position, able to cross midline , not blinking to visual threat on the right, blinking to threat on the left,  able to track on the left but not right. Mild lower right facial droop.  Tongue protrusion not cooperative. Spontaneous movement of LUE and LLE, LUE at least 4/5, no drift when lifted in air. LLE 3/5 with pain stimulation. RUE flaccid, right-sided neglect, RLE slight withdraw to pain. DTR 1+. Right upgoing toe, left downgoing toe, . Sensation, coordination and gait not tested.   ASSESSMENT/PLAN Brad Delacruz is a 59 y.o. male with history of HTN and DB noncompliant w/ medications presenting to [facility] with R sided weakness and inability to talk. NIH 28. CTA showed L M1 occlusion. Transferred to [facility]. On arrival, received  IV tPA 03/24/2020 at 0725 followed by mechanical thrombectomy w/ TICI2C revascularization and rescue stent placement.  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 Old L corona radiata and internal capsule infarcts. ASPECTS 10.     CTA head & neck large L MCA territory infarct. L M1 LVO. Collateral vessels in sylvian fissure. B ICA bifurcation atherosclerosis. Aortic atherosclerosis.   CT perfusion large L MCA penumbra.   Cerebral angio L MCA M1 occlusion w/ TICI2c revascularization x 6 passes. Rescue stenting for reocclusion   CT head 4/21 - evolving large left MCA infarct, trace midline shift, persistent high density and left C1 fissure, contrast versus blood  MRI  Large L MCA infarct evolution w/ edema and mass effect w/ 40mm L midline shift. SAH L sylvian fissure, L cerebral cortical sulci and basilar cisterns.  MRA  L M1 stent w/o flow c/w reocclusion. L ICA filling defect c/w small thrombus. VBJ w/ infundibulum vs aneurysm.  CT head 4/23 continued evolution large L MCA infarct w/ increased edema, now w/ 5-25mm midline shift. Stable L SAH w/ trace blood in tentorium.    CT head 4/24 - stable 5 mm shift. Large Lt MCA infarct. Subarachnoid blood and/or contrast in and around the left Sylvian fissure.   CT Head 03/29/2020 - Sequelae of large Left MCA territory infarct remain stable since 03/27/2020. Rightward midline shift remains 5 mm.  2D Echo EF 55-60%. No source of embolus   LE doppler neg DVT  May consider TEE and loop recorder if neuro improvement later. Defer to stroke team Monday if hypercoag panel to be ordered inpatient.  LDL 55.1  TG 798->675->269 (on 4/22)   HgbA1c 9.2  SCDs for VTE prophylaxis  No antithrombotic prior to admission, now on aspirin 81 mg daily and Brilinta  (ticagrelor) 90 mg bid given stent placement and L tICA thrombus.   Therapy recommendations:  SNF - possible CIR (consult pending)  Disposition:  pending   Cerebral edema Induced Hypernatremia  CT showed large left MCA infarct with minimal midline shift  MRI  Large L MCA infarct evolution w/ edema and mass effect w/ 77mm L midline shift.   CT head 4/23 continued evolution large L MCA infarct w/ increased edema, now w/ 5-72mm midline shift. Stable L SAH w/ trace blood in tentorium.   CT head 4/25 stable  On 3% saline @ 75 via peripheral line - may consider central line if needed - held due to hyperglycemia  Goal Na 150-155  Na 143->145->154->163->161->166  CT head 4/24 - stable (see above)  Based on CT 4/24, may need to consult NSG for possible hemicrani:  Recommendations for elevated Na now 166  -Add free water flushes-100 cc every 4 hours -> now to every 2 hours for 1 day  -Stop 3% -> done  -Start normal saline at 100 cc an hour -> done  -Give a bolus of normal saline 500 cc now -> done -> repeat today  -Recheck sodium in 6 hours-> ordered every 6 hours  - CCM consulted for assistance with fluid/electrolyte status and tachypnea this morning  Acute Respiratory Failure   Intubated for IR, left intubated post IR for airway protection secondary to acute L MCA stroke  Extubation 4/22  CCM on board, re-consulted today (Sunday)  Tolerating well so far  Close monitoring - high risk for neuro decline and reintubation, tachypnea Sunday  Hypertensive Emergency  SBP > 210 on arrival  Home meds:  Non-compliant - med rec  lists:  losartan 25   BP variable during the night w/ agitation  Cleviprex changed to cardene d/t elevated TG -> now off  BP goal < 160 given SAH -> resume home dose of Cozaar 25 mg daily.  Hydralazine and labetalol PRN . Long-term BP goal normotensive  Fever  Leukocytosis WBC 11.3->9.3->11.1->13.3->12.8->13.0  Temp -  100.1->99.3->100.3->99.7  Will order CXR - 4/25 - Low lung volumes without radiographic evidence of acute cardiopulmonary disease.  Will order U/A - C/w UTI - start IV Rocephin  Blood cultures - pending  Urine Culture - pending  Hyperlipidemia  Home meds:  Non-compliant - med rec lists:  pravachol 10, crestor 5  LDL 55.1, goal < 70  TG 798->675-> 269 (on 4/22)  On lipitor 80  Continue statin at discharge  Diabetes type II Uncontrolled  Home meds:  Noncompliant - med rec lists:  dapagliflozin 10, glipizide 5, actos 30, dapagliflozin 10 / metformin 1000  HgbA1c 9.2, goal < 7.0  CBGs  SSI 0-15 q4   Dysphagia . Secondary to stroke . NPO . Speech on board . Put on TF @ 40 . Has Cortrak   Other Stroke Risk Factors  Hx ETOH use, alcohol level <10, advised to drink no more than 2 drink(s) a day  Substance abuse - UDS:  Benzos POSITIVE  Obesity, Body mass index is 27.62 kg/m., recommend weight loss, diet and exercise as appropriate   Other Active Problems  Thrombocytopenia PLT - resolved  Hypokalemia K 3.2    supplement   Urinary retention - I&O cath, foley prn  Code status - Full code  Hospital day # 6  Discontinue hypertonic saline.  Continue free water and follow serum sodium and creatinine to ensure adequate hydration.  Continue antibiotic for presumed aspiration pneumonia.  Long discussion with wife and daughter at the bedside about his prognosis and plan of care.  Hopefully try to extubate over the next 2 -3 days if he does better.  Discussed with critical care team This patient is critically ill and at significant risk of neurological worsening, death and care requires constant monitoring of vital signs, hemodynamics,respiratory and cardiac monitoring, extensive review of multiple databases, frequent neurological assessment, discussion with family, other specialists and medical decision making of high complexity.I have made any additions or clarifications  directly to the above note.This critical care time does not reflect procedure time, or teaching time or supervisory time of PA/NP/Med Resident etc but could involve care discussion time.  I spent 30 minutes of neurocritical care time  in the care of  this patient.    Delia Heady, MD To contact Stroke Continuity provider, please refer to [email or web address]. After hours, contact General Neurology

## 2020-03-30 NOTE — Progress Notes (Signed)
Pt BP 96/57 on Propofol gtt and vented. Notified Neuro MD of low BP in the presence of recent stroke. Pt also tachypneic and dysynchronous on the vent. No neuro change assessed. See MAR for interventions.

## 2020-03-30 NOTE — Progress Notes (Signed)
eLink Physician-Brief Progress Note Patient Name: Brad Delacruz DOB: Feb 13, 1961 MRN: [id number]   Date of Service  03/30/2020  HPI/Events of Note  Pt with in / out cath x 2 for urinary retention.  eICU Interventions  Foley catheter order entered.        Thomasene Lot Maggie Dworkin 03/30/2020, 4:55 AM

## 2020-03-30 NOTE — Progress Notes (Signed)
CRITICAL VALUE ALERT  Critical Value:  Sodium 161  Date & Time Notied:  03/30/20 1121  Provider Notified: CCM  Orders Received/Actions taken: No orders at this time  Heloise Purpura RN

## 2020-03-30 NOTE — Progress Notes (Signed)
eLink Physician-Brief Progress Note Patient Name: Brad Delacruz DOB: [birth year] MRN: [id number]   Date of Service  03/30/2020  HPI/Events of Note  Tachypnea and Ventilator dyssynchrony , hypotension  eICU Interventions  Sedation changed to Precedex + fentanyl infusions, Propofol discontinued,  Phenylephrine ordered for hypotension        Migdalia Dk 03/30/2020, 1:42 AM

## 2020-03-30 NOTE — Progress Notes (Addendum)
S/O: Patient BP is low at 96/57. Propofol running at a rate of 10. Continues to be globally aphasic. Pupils equal. Moving his left side. No neuro change. Still overbreathing the vent with a rate of 30. He was intubated on Sunday due to tachypnea and work up for this was initiated by CCM.   CT head in the AM on Sunday (0946): 1. Sequelae of large Left MCA territory infarct remain stable since 03/27/2020. Rightward midline shift remains 5 mm. 2. No new intracranial abnormality.  BP (!) 105/59   Pulse (!) 121   Temp (!) 101.3 F (38.5 C) (Axillary)   Resp (!) 32   Ht 5\' 5"  (1.651 m)   Wt 75.3 kg   SpO2 98%   BMI 27.62 kg/m   A/R: 59 year old male with massive left MCA ischemic infarction. Continues to be tachypneic. Also hypotensive and febrile.   Tachypnea:  -- Discussed continued overbreathing of vent with tachypnea. Unable to increase propofol in the event that this is due to agitation.  -- Will need to determine if there is an etiology other than agitation or Central Neurogenic Hyperventilation for the patient's tachypnea.  -- CCM to consider ABG and also possible change to vent settings.   Hypotension: Likely will need to change sedation from propofol to Precedex, as the latter has less of a tendency to lower BP. CCM is considering addition of a pressor if BP does not respond to switch from propofol to alternate sedative. Goal SBP given stroke is 120-160.   Fever:  -- Blood cultures drawn earlier today are pending.  -- Post-intubation CXR showed no PNA -- Recent U/A was not consistent with UTI -- Neurogenic fever is also on the DDx -- Continue Tylenol.   Electronically signed: Dr. 46

## 2020-03-30 NOTE — Progress Notes (Signed)
SLP Cancellation Note  Patient Details Name: Chimaobi Casebolt MRN: [id number] DOB: March 08, 1961   Cancelled treatment:       Reason Eval/Treat Not Completed: Medical issues which prohibited therapy. Pt now intubated. Will f/u as able.   Mahala Menghini., M.A. CCC-SLP Acute Rehabilitation Services Pager [phone] Office [phone]  03/30/2020, 8:19 AM

## 2020-03-30 NOTE — Progress Notes (Addendum)
NAME:  Brad Delacruz, MRN:  [id number], DOB:  [birth year], LOS: 6 ADMISSION DATE:  03/24/2020, CONSULTATION DATE:  03/24/20 REFERRING MD:  Corliss Skains, CHIEF COMPLAINT:  Vent management   Brief History   59yo male with hx DM, HTN presented 4/20 to [facility] with sudden onset R sided weakness and aphasia.  SBP >200.  CT revealed dense L MCA occlusion.  He was tx urgently to [facility], IV tPA given and underwent IR revascularization of occluded L MCA.  Pt left intubated post procedure and PCCM consulted for vent and ICU management.   Past Medical History  DM  Significant Hospital Events   4/20: Admitted > S/p tPa and intracranial thrombectomy - rescue stenting of left MCA M1 segment occlusion achieving a TICI 2C revascularization 03/24/2020 by Dr. Corliss Skains.  Consults:  PCCM  Procedures:  4/20: Left MCA revascularization  4.21- Difficulties with BP control overnight. Agitated. Started on versed 10 mg/hr.  4/22 -Extubated  4/23 > Worsening midline shift to 5-52mm and 3% saline started by neuro via PIV 4/25 > Intubated for worsening neuro status and aspiration   Significant Diagnostic Tests:  CTA head 4/20>  Large left MCA territory nonhemorrhagic infarct. Acute left M1 large vessel occlusion. Large penumbra with mismatch volume of 99 mL. Collateral vessels are present within the sylvian fissure. CT head 4/23 1. Continued evolution of large Left MCA infarct with increased intracranial mass effect since 03/26/2019. 59yo midline shift now 5-6 mm. Basilar cisterns remain patent. 2. Stable left hemisphere subarachnoid hemorrhage and/or contrast, including trace blood layering along the midline tentorium.3. No new intracranial abnormality. CT Head 4/25: 1. Sequelae of large Left MCA territory infarct remain stable since 03/27/2020. Rightward midline shift remains 5 mm. 2. No new intracranial abnormality.  Micro Data:  4/20: SARS COVID negative  Sputum 4/25 >> Blood 4/25 >>  Urine 4/25 >>     Antimicrobials:  Cefepime 4/26 >>   Interim history/subjective:  This overnight with Hypoxia and non-compliance with vent requiring adjustments in sedation. Remains intubated.    Objective   Blood pressure (!) 157/78, pulse (!) 104, temperature (!) 101.4 F (38.6 C), temperature source Axillary, resp. rate (!) 26, height 5\' 5"  (1.651 m), weight 75.3 kg, SpO2 100 %.    Vent Mode: PRVC FiO2 (%):  [40 %-100 %] 40 % Set Rate:  [16 bmp] 16 bmp Vt Set:  [490 mL] 490 mL PEEP:  [8 cmH20] 8 cmH20 Plateau Pressure:  [17 cmH20-18 cmH20] 18 cmH20   Intake/Output Summary (Last 24 hours) at 03/30/2020 0854 Last data filed at 03/30/2020 0600 Gross per 24 hour  Intake 4065.41 ml  Output 1550 ml  Net 2515.41 ml   Filed Weights   03/27/20 0500 03/28/20 0457 03/29/20 0500  Weight: 80.3 kg 75.3 kg 75.3 kg   Examintion General Appearance:  Adult male, no distress, on vent  HEENT: Dry MM, ETT/OG in place  Lungs: Coarse breath sounds, no crackles, wheeze  Heart:  RRR, no MRG Abdomen:  Obese, soft, active bowel sounds  Genitalia / Rectal:  Intact  Extremities:  -edema  Skin: intact  Neurologic: non-verbal, opens eyes to physical stimulation, does not follow commands, denise hemiplegia right side, weak cough/gag    Resolved Hospital Problem list   None  Assessment & Plan:   Acute L MCA stroke with rightward midline shift- s/p IV tPA and IR revascularization  Plan - Per stroke team  - Hypertonic on hold due to hypernatremia > Currently on Normal Saline >> Trend  Sodium q6h. Hold Saline for now due to continued hypernatremia above goal of 150-155. Free water continues at 100 q2h   Acute respiratory failure/insufficeny in setting of above - post neuro IR in setting L MCA stroke / s/p extubation 4/22 Plan -Continue Vent Support >> Mental Status Barrier to extubation  -VAP Bundle  -Titrate Supplemental Oxygen to maintain Saturation >92   HTN  -cleviprex gtt required on arrival Plan  -  Cardiac Monitoring  - SBP goal <160 (Currently on NEO for Systolic Goal 737-106.  - Hold home Cozaar, PRN Labetalol    Acute Kidney Injury  Hypokalemia  Hypophosphatemia  Plan  -Trend BMP  -Replacing Phos and K now  -Trending NA q6h as above -Holding Saline for now. Goal NA 150-155 -Continue Free Water 100 q2h  -Send Urine Studies  Febrile in setting of presumed Aspiration PNA vs Neurogenic fever  -PCT 0.13 > 0.85  -CXR with left base consolidation vs atelectasis, sputum thick and brown  Plan -Trend WBC and Fever Curve  -Follow Culture Data  -Continue Cefepime   Hx Type 2 DM  - CBG goal < 180  - SSI, Add Lantus 4/25   Dysphagia  Plan -Continue TF per Cortrack  Best practice:  Diet: NPO DVT prophylaxis: SCD's GI prophylaxis: pepcid Glucose control: SSI Mobility: BR Code Status: full Family Communication: Updated Wife and Daughter  Disposition: Continue ICU Level Care   Hayden Pedro, AGACNP-BC [facility]  Pgr: [phone]  PCCM Pgr: [phone]

## 2020-03-30 NOTE — Progress Notes (Signed)
Pharmacy Antibiotic Note  Brad Delacruz is a 59 y.o. male admitted on 03/24/2020 with CVA L MCA s/p tpa and IR for revascularization.  Pharmacy consulted for cefepime dosing for PNA. Patient was started on cefepime 2g IV q8h based on CrCl of 53ml/min. Today, Scr increased from 1.00 to 1.61 with current CrCl of 46 ml/min. WBC 11.4. Tmax 101.3   Plan: Adjust cefepime to 2g IV q12h  Monitor renal function, cultures/sensitivites, and clinical progression   Height: 5\' 5"  (165.1 cm) Weight: 75.3 kg (166 lb 0.1 oz) IBW/kg (Calculated) : 61.5  Temp (24hrs), Avg:100 F (37.8 C), Min:98.7 F (37.1 C), Max:101.3 F (38.5 C)  Recent Labs  Lab 03/26/20 0556 03/26/20 0556 03/27/20 0706 03/28/20 0529 03/29/20 0419 03/29/20 1114 03/29/20 1332 03/30/20 0418  WBC 11.1*  --  13.3* 12.8* 13.0*  --   --  11.4*  CREATININE 0.96   < > 0.80 0.90 0.99  --  1.00 1.61*  LATICACIDVEN  --   --   --   --   --  1.7  --   --    < > = values in this interval not displayed.    Estimated Creatinine Clearance: 46.8 mL/min (A) (by C-G formula based on SCr of 1.61 mg/dL (H)).    No Known Allergies  Antimicrobials this admission: Cefepime 4/25 >>   Dose adjustments this admission: 4/26: adjust cefepime to 2g IV q12h based on renal function   Microbiology results: 4/25 Bcx: 4/25 Sputum Cx:  5/25, PharmD PGY1 Pharmacy Resident Brad Delacruz: [phone]  03/30/2020 6:49 AM

## 2020-03-30 NOTE — Progress Notes (Signed)
Inpatient Diabetes Program Recommendations  AACE/ADA: New Consensus Statement on Inpatient Glycemic Control (2015)  Target Ranges:  Prepandial:   less than 140 mg/dL      Peak postprandial:   less than 180 mg/dL (1-2 hours)      Critically ill patients:  140 - 180 mg/dL   Lab Results  Component Value Date   GLUCAP 258 (H) 03/30/2020   HGBA1C 9.2 (H) 03/24/2020    Review of Glycemic Control Results for RYNELL, CIOTTI (MRN [id number]) as of 03/30/2020 13:14  Ref. Range 03/29/2020 19:48 03/29/2020 23:31 03/30/2020 03:23 03/30/2020 07:52 03/30/2020 11:27  Glucose-Capillary Latest Ref Range: 70 - 99 mg/dL 041 (H) 364 (H) 383 (H) 247 (H) 258 (H)   Inpatient Diabetes Program Recommendations:  -Consider Novolog 3 units q 4 hrs. Tube feed coverage. (hold if tube feed stopped or held for any reason) Secure chat to Dr. Pearlean Brownie.  Thank you, Billy Fischer. Dane Bloch, RN, MSN, CDE  Diabetes Coordinator Inpatient Glycemic Control Team Team Pager [phone] (8am-5pm) 03/30/2020 1:16 PM

## 2020-03-30 NOTE — Progress Notes (Signed)
Spoke with Primary RN re: PICC. Patient is still febrile, will follow up in AM.

## 2020-03-31 ENCOUNTER — Inpatient Hospital Stay (HOSPITAL_COMMUNITY): Payer: BC Managed Care – PPO

## 2020-03-31 DIAGNOSIS — J9600 Acute respiratory failure, unspecified whether with hypoxia or hypercapnia: Secondary | ICD-10-CM | POA: Diagnosis not present

## 2020-03-31 DIAGNOSIS — I639 Cerebral infarction, unspecified: Secondary | ICD-10-CM

## 2020-03-31 LAB — CBC
HCT: 41 % (ref 39.0–52.0)
Hemoglobin: 13.1 g/dL (ref 13.0–17.0)
MCH: 30.8 pg (ref 26.0–34.0)
MCHC: 32 g/dL (ref 30.0–36.0)
MCV: 96.2 fL (ref 80.0–100.0)
Platelets: 166 10*3/uL (ref 150–400)
RBC: 4.26 MIL/uL (ref 4.22–5.81)
RDW: 14.6 % (ref 11.5–15.5)
WBC: 10.5 10*3/uL (ref 4.0–10.5)
nRBC: 0 % (ref 0.0–0.2)

## 2020-03-31 LAB — GLUCOSE, CAPILLARY
Glucose-Capillary: 200 mg/dL — ABNORMAL HIGH (ref 70–99)
Glucose-Capillary: 195 mg/dL — ABNORMAL HIGH (ref 70–99)
Glucose-Capillary: 191 mg/dL — ABNORMAL HIGH (ref 70–99)
Glucose-Capillary: 164 mg/dL — ABNORMAL HIGH (ref 70–99)
Glucose-Capillary: 148 mg/dL — ABNORMAL HIGH (ref 70–99)
Glucose-Capillary: 198 mg/dL — ABNORMAL HIGH (ref 70–99)

## 2020-03-31 LAB — PHOSPHORUS: Phosphorus: 2.2 mg/dL — ABNORMAL LOW (ref 2.5–4.6)

## 2020-03-31 LAB — CULTURE, RESPIRATORY W GRAM STAIN

## 2020-03-31 LAB — BASIC METABOLIC PANEL
Anion gap: 6 (ref 5–15)
BUN: 37 mg/dL — ABNORMAL HIGH (ref 6–20)
CO2: 23 mmol/L (ref 22–32)
Calcium: 8.1 mg/dL — ABNORMAL LOW (ref 8.9–10.3)
Chloride: 130 mmol/L — ABNORMAL HIGH (ref 98–111)
Creatinine, Ser: 1.44 mg/dL — ABNORMAL HIGH (ref 0.61–1.24)
GFR calc Af Amer: >60 mL/min (ref >60)
GFR calc non Af Amer: 53 mL/min — ABNORMAL LOW (ref >60)
Glucose, Bld: 222 mg/dL — ABNORMAL HIGH (ref 70–99)
Potassium: 3.4 mmol/L — ABNORMAL LOW (ref 3.5–5.1)
Sodium: 159 mmol/L — ABNORMAL HIGH (ref 135–145)

## 2020-03-31 LAB — MAGNESIUM: Magnesium: 2.1 mg/dL (ref 1.7–2.4)

## 2020-03-31 LAB — PROCALCITONIN: Procalcitonin: 0.56 ng/mL

## 2020-03-31 MED ORDER — FREE WATER
200.0000 mL | Status: DC
  Administered 2020-03-31 – 2020-04-03 (×42): 200 mL

## 2020-03-31 MED ORDER — POTASSIUM CHLORIDE 20 MEQ/15ML (10%) PO SOLN
40.0000 meq | Freq: Once | ORAL | Status: AC
  Administered 2020-03-31: 06:00:00 40 meq
  Filled 2020-03-31: qty 30

## 2020-03-31 MED ORDER — CEFAZOLIN SODIUM-DEXTROSE 2-4 GM/100ML-% IV SOLN
2.0000 g | Freq: Three times a day (TID) | INTRAVENOUS | Status: AC
  Administered 2020-03-31 – 2020-04-05 (×17): 2 g via INTRAVENOUS
  Filled 2020-03-31 (×19): qty 100

## 2020-03-31 MED ORDER — SODIUM PHOSPHATES 45 MMOLE/15ML IV SOLN
10.0000 mmol | Freq: Once | INTRAVENOUS | Status: AC
  Administered 2020-03-31: 10 mmol via INTRAVENOUS
  Filled 2020-03-31: qty 3.33

## 2020-03-31 MED ORDER — INSULIN GLARGINE 100 UNIT/ML ~~LOC~~ SOLN
30.0000 [IU] | Freq: Two times a day (BID) | SUBCUTANEOUS | Status: DC
  Administered 2020-03-31 – 2020-04-03 (×8): 30 [IU] via SUBCUTANEOUS
  Filled 2020-03-31 (×11): qty 0.3

## 2020-03-31 MED ORDER — HEPARIN SODIUM (PORCINE) 5000 UNIT/ML IJ SOLN
5000.0000 [IU] | Freq: Three times a day (TID) | INTRAMUSCULAR | Status: DC
  Administered 2020-03-31 – 2020-04-13 (×40): 5000 [IU] via SUBCUTANEOUS
  Filled 2020-03-31 (×40): qty 1

## 2020-03-31 MED ORDER — INSULIN ASPART 100 UNIT/ML ~~LOC~~ SOLN
6.0000 [IU] | SUBCUTANEOUS | Status: DC
  Administered 2020-03-31 – 2020-04-04 (×24): 6 [IU] via SUBCUTANEOUS

## 2020-03-31 MED ORDER — VITAL AF 1.2 CAL PO LIQD
1000.0000 mL | ORAL | Status: DC
  Administered 2020-04-01 – 2020-04-04 (×5): 1000 mL
  Filled 2020-03-31 (×4): qty 1000

## 2020-03-31 NOTE — Progress Notes (Signed)
TCD bubble study completed with Dr. Pearlean Brownie. Refer to "CV Proc" under chart review to view preliminary results.  03/31/2020 3:34 PM Eula Fried., MHA, RVT, RDCS, RDMS

## 2020-03-31 NOTE — Progress Notes (Signed)
Physical Therapy Treatment Patient Details Name: Brad Delacruz MRN: [id number] DOB: 17-Mar-1961 Today's Date: 03/31/2020    History of Present Illness 59 y.o. male past medical history of diabetes, hypertension which he does not take medication for, presented to the emergency room at [facility] with last known well of 5 AM when he was at work and was noted to have a sudden onset of right-sided weakness and inability to talk. Pt given TPA and s/p thrombectomy on 4/20. Pt remains intubated and restrained as of 4/22.    PT Comments    Patient seen in coordination with OT with focus of session on sitting balance, visual tracking to midline and reaching for item with hand over had on the L.  Patient with only focused attention unable to track item for long with max cueing in broken Spanish.  He did keep his eyes open for majority of time seated at EOB about 15 minutes.  He has two family members in the room and more able to provide help at home.  Feel he could benefit from CIR level rehab at d/c with family support available for help at d/c.  PT to follow acutely.   Follow Up Recommendations  Supervision/Assistance - 24 hour;CIR     Equipment Recommendations  Wheelchair (measurements PT);Wheelchair cushion (measurements PT);Hospital bed;Other (comment)    Recommendations for Other Services       Precautions / Restrictions Precautions Precautions: Fall Precaution Comments: vent Restrictions Weight Bearing Restrictions: No    Mobility  Bed Mobility Overal bed mobility: Needs Assistance Bed Mobility: Supine to Sit;Rolling;Sit to Supine Rolling: Total assist   Supine to sit: Total assist Sit to supine: Total assist   General bed mobility comments: Total A to manage BLEs and elevate trunk  Transfers Overall transfer level: Needs assistance               General transfer comment: Defer for safety  Ambulation/Gait                 Stairs              Wheelchair Mobility    Modified Rankin (Stroke Patients Only)       Balance Overall balance assessment: Needs assistance Sitting-balance support: No upper extremity supported;Feet supported Sitting balance-Leahy Scale: Zero Sitting balance - Comments: mod to max A for sitting balance, R pushing Postural control: Posterior lean;Left lateral lean                                  Cognition Arousal/Alertness: Lethargic Behavior During Therapy: Flat affect Overall Cognitive Status: Difficult to assess                                 General Comments: Opening eyes to voices. Difficulty sustain attention during visual tracking task.       Exercises Other Exercises Other Exercises: Visual focusing to single object (blue flash light) and then providing hand over hand to reach and grasp object. 10 reps. Max cues Other Exercises: Cervical ROM    General Comments General comments (skin integrity, edema, etc.): patient on PS/CPAP on vent FiO2 40%, PEEP 5      Pertinent Vitals/Pain Pain Assessment: Faces Faces Pain Scale: Hurts a little bit Pain Location: generalized with transitions to EOB Pain Descriptors / Indicators: Grimacing Pain Intervention(s): Monitored during session;Repositioned  Home Living                      Prior Function            PT Goals (current goals can now be found in the care plan section) Acute Rehab PT Goals Patient Stated Goal: Per family, return home safely Progress towards PT goals: Progressing toward goals    Frequency    Min 4X/week      PT Plan Discharge plan needs to be updated;Frequency needs to be updated    Co-evaluation PT/OT/SLP Co-Evaluation/Treatment: Yes Reason for Co-Treatment: Complexity of the patient's impairments (multi-system involvement);To address functional/ADL transfers;For patient/therapist safety PT goals addressed during session: Mobility/safety with  mobility;Balance OT goals addressed during session: ADL's and self-care      AM-PAC PT "6 Clicks" Mobility   Outcome Measure  Help needed turning from your back to your side while in a flat bed without using bedrails?: Total Help needed moving from lying on your back to sitting on the side of a flat bed without using bedrails?: Total Help needed moving to and from a bed to a chair (including a wheelchair)?: Total Help needed standing up from a chair using your arms (e.g., wheelchair or bedside chair)?: Total Help needed to walk in hospital room?: Total Help needed climbing 3-5 steps with a railing? : Total 6 Click Score: 6    End of Session Equipment Utilized During Treatment: Other (comment)(vent) Activity Tolerance: Patient limited by fatigue Patient left: in bed;with call bell/phone within reach;with family/visitor present;with restraints reapplied Nurse Communication: Mobility status PT Visit Diagnosis: Muscle weakness (generalized) (M62.81);Other symptoms and signs involving the nervous system (R29.898);Hemiplegia and hemiparesis Hemiplegia - caused by: Cerebral infarction     Time: 3875-6433 PT Time Calculation (min) (ACUTE ONLY): 27 min  Charges:  $Therapeutic Activity: 8-22 mins                     Magda Kiel, [place] Acute Rehabilitation Services [phone] 03/31/2020    Reginia Naas 03/31/2020, 5:31 PM

## 2020-03-31 NOTE — Progress Notes (Signed)
eLink Physician-Brief Progress Note Patient Name: Brad Delacruz DOB: June 09, 1961 MRN: [id number]   Date of Service  03/31/2020  HPI/Events of Note  K+ 3.4, PHOS 2.3, Chloride 30  eICU Interventions  Free water increased to 200 ml Q 2 hours via NG tube, KCL 40 meq once via NG tube x 1, Phosphorus 10 mmol I bolus x 1        Kristyne Woodring U Zamzam Whinery 03/31/2020, 5:36 AM

## 2020-03-31 NOTE — Progress Notes (Addendum)
NAME:  Brad Delacruz, MRN:  [id number], DOB:  July 24, 1961, LOS: 7 ADMISSION DATE:  03/24/2020, CONSULTATION DATE:  03/24/20 REFERRING MD:  Corliss Skains, CHIEF COMPLAINT:  Vent management   Brief History   58yo male with hx DM, HTN presented 4/20 to [facility] with sudden onset R sided weakness and aphasia.  SBP >200.  CT revealed dense L MCA occlusion.  He was tx urgently to [facility], IV tPA given and underwent IR revascularization of occluded L MCA.  Pt left intubated post procedure and PCCM consulted for vent and ICU management.   Past Medical History   Past Medical History:  Diagnosis Date  . Diabetes mellitus without complication (HCC)      Significant Hospital Events   4/20: Admitted > S/p tPa and intracranial thrombectomy - rescue stenting of left MCA M1 segment occlusion achieving a TICI 2C revascularization 03/24/2020 by Dr. Corliss Skains.  Procedures:  4/20: Left MCA revascularization  4.21- Difficulties with BP control overnight. Agitated. Started on versed 10 mg/hr.  4/22 -Extubated  4/23 > Worsening midline shift to 5-64mm and 3% saline started by neuro via PIV 4/25 > Intubated for worsening neuro status and aspiration   Significant Diagnostic Tests:  CTA head 4/20>  Large left MCA territory nonhemorrhagic infarct. Acute left M1 large vessel occlusion. Large penumbra with mismatch volume of 99 mL. Collateral vessels are present within the sylvian fissure. CT head 4/23 1. Continued evolution of large Left MCA infarct with increased intracranial mass effect since 03/26/2019. Rightward midline shift now 5-6 mm. Basilar cisterns remain patent. 2. Stable left hemisphere subarachnoid hemorrhage and/or contrast, including trace blood layering along the midline tentorium.3. No new intracranial abnormality. CT Head 4/25: 1. Sequelae of large Left MCA territory infarct remain stable since 03/27/2020. Rightward midline shift remains 5 mm. 2. No new intracranial abnormality.  Micro Data:    4/20: SARS COVID negative  Sputum 4/25 >> NGTD Blood 4/25 >> NGTD Urine 4/25 >> NGTD  Antimicrobials:  none  Interim history/subjective:  Dexmedetomidine stopped today.   Objective   Blood pressure 116/62, pulse 80, temperature 99.1 F (37.3 C), temperature source Axillary, resp. rate (!) 22, height 5\' 5"  (1.651 m), weight 80.2 kg, SpO2 98 %.    Vent Mode: PRVC FiO2 (%):  [40 %] 40 % Set Rate:  [16 bmp] 16 bmp Vt Set:  [490 mL] 490 mL PEEP:  [5 cmH20-8 cmH20] 5 cmH20   Intake/Output Summary (Last 24 hours) at 03/31/2020 04/02/2020 Last data filed at 03/31/2020 0700 Gross per 24 hour  Intake 4869.05 ml  Output 1800 ml  Net 3069.05 ml   Filed Weights   03/28/20 0457 03/29/20 0500 03/31/20 0500  Weight: 75.3 kg 75.3 kg 80.2 kg   Examintion General Appearance:  Adult male, no distress, on vent  HEENT: Dry MM, ETT/OG in place  Lungs: Clear chest, tolerating PSV, minimal secretions and strong cough with stimulation. Heart:  RRR, no MRG Abdomen:  Obese, soft, active bowel sounds  Genitalia / Rectal:  Intact  Extremities:  -edema  Skin: intact  Neurologic: opens eyes to voice. Moves left side with normal strength purposefully but unable to follow commands.      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 Imaging consistent with large completed stroke.  Don't expect significant short term recovery. Prognosis for long-term improvement is guarded. - Family to decide regarding tracheostomy and  PEG vs transition to comfort care.    Critically ill due to acute hypoxic respiratory failure requiring mechanical ventilation due to aspiration pneumonitis due to impair swallowing from CVA.\Plan -Continue Vent Support with PSV trial.  - Likely need tracheostomy for airway protection - May attempt trial of extubation as patient more awake and family is undecided regarding  tracheostomy.  Iatrogenic hypernatremia due to 3% saline therapy.  -continue slow correction with free water.  May see further improvement in neurological status with this.    HTN  - titrating oral medications.   Daily Goals Checklist  Pain/Anxiety/Delirium protocol (if indicated): keep off all sedation Neuro vitals: every 4 hours AED's: none VAP protocol (if indicated): bundle in place Respiratory support goals: SBT daily. Trial of extubation if continues to show signs of waking up. Blood pressure target: 800-349 systolic  DVT prophylaxis: heparin tid Nutrition Status: moderate nutritional risk - on tube feeds at goal. Continue Cortrak, improving  GI prophylaxis: pantoprazole Fluid status goals: Continue free water as mildly volume contracted. Urinary catheter: external  Central lines: Peripherals only Glucose control: Type 2 DM with suboptimal control. Will increase Lantus and basal insulin Mobility/therapy needs: Progressive mobilization once mental status allows. Antibiotic de-escalation: Complete 5 days of cefepime. Home medication reconciliation: home oral DM agents on hold.  Daily labs: CBC, BMP Code Status: Full  Family Communication: Spoke to family yesterday in [place] and explained that current mental status is unlikely to change substantially for the next several months if ever. At this time, tracheostomy and PEG are indicated. I sense that patient prior wishes are at odds with the families inclinations. Disposition: ICU  CRITICAL CARE Performed by: Kipp Brood   Total critical care time: 30 minutes  Critical care time was exclusive of separately billable procedures and treating other patients.  Critical care was necessary to treat or prevent imminent or life-threatening deterioration.  Critical care was time spent personally by me on the following activities: development of treatment plan with patient and/or surrogate as well as nursing, discussions with  consultants, evaluation of patient's response to treatment, examination of patient, obtaining history from patient or surrogate, ordering and performing treatments and interventions, ordering and review of laboratory studies, ordering and review of radiographic studies, pulse oximetry, re-evaluation of patient's condition and participation in multidisciplinary rounds.  Kipp Brood, MD [facility] ICU Physician [place]  Pager: [phone] Mobile: [phone] After hours: [phone].

## 2020-03-31 NOTE — Progress Notes (Signed)
SLP Cancellation Note  Patient Details Name: Nyzier Boivin MRN: [id number] DOB: 1961-08-27   Cancelled treatment:       Reason Eval/Treat Not Completed: Medical issues which prohibited therapy (remains on vent). Will continue to follow.    Mahala Menghini., M.A. CCC-SLP Acute Rehabilitation Services Pager [phone] Office [phone]  03/31/2020, 7:56 AM

## 2020-03-31 NOTE — Progress Notes (Addendum)
STROKE TEAM PROGRESS NOTE   INTERVAL HISTORY Patient remains intubated for respiratory failure.  Is more awake today though he remains aphasic and did not follow any commands for me consistently.  He did follow some gaze commands for his daughter when she spoke in [place].  Vital signs are stable.  Patient's wife is at the bedside and she wants full support including trach and PEG if necessary but states that she will look after him at home once he finishes rehab stay.  Vital signs are stable.  Vitals:   03/31/20 0900 03/31/20 1000 03/31/20 1114 03/31/20 1200  BP: 120/60 (!) 143/77    Pulse: 81 (!) 103    Resp: (!) 22 (!) 23    Temp:    100.3 F (37.9 C)  TempSrc:      SpO2: 98% 100% 100%   Weight:      Height:        CBC:  Recent Labs  Lab 03/29/20 0419 03/29/20 1113 03/30/20 0418 03/31/20 0355  WBC 13.0*  --  11.4* 10.5  NEUTROABS 11.0*  --  8.8*  --   HGB 16.2   < > 15.8 13.1  HCT 48.9   < > 48.2 41.0  MCV 93.1  --  96.2 96.2  PLT 207  --  205 166   < > = values in this interval not displayed.    Basic Metabolic Panel:  Recent Labs  Lab 03/30/20 0418 03/30/20 0955 03/30/20 1545 03/30/20 1545 03/30/20 2202 03/31/20 0355  NA 162*   < > 160*   < > 161* 159*  K 3.4*  --  3.7  --   --  3.4*  CL >130*  --  >130*  --   --  130*  CO2 22  --  19*  --   --  23  GLUCOSE 249*  --  292*  --   --  222*  BUN 45*  --  40*  --   --  37*  CREATININE 1.61*  --  1.47*  --   --  1.44*  CALCIUM 8.5*  --  8.0*  --   --  8.1*  MG 2.3  --   --   --   --  2.1  PHOS 2.0*  --   --   --   --  2.2*   < > = values in this interval not displayed.   IMAGING past 24 hours DG CHEST PORT 1 VIEW  Result Date: 03/31/2020 CLINICAL DATA:  ET tube EXAM: PORTABLE CHEST 1 VIEW COMPARISON:  03/30/2020 FINDINGS: Endotracheal tube tip is 1.6 cm above the carina. Feeding tube is seen entering the stomach. Low lung volumes with bibasilar atelectasis and mild vascular congestion. No effusions or  pneumothorax. No acute bony abnormality. IMPRESSION: Endotracheal tube 1.6 cm above the carina. Low lung volumes with vascular congestion and bibasilar atelectasis. Electronically Signed   By: Charlett Nose M.D.   On: 03/31/2020 08:38     PHYSICAL EXAM     Obese middle-aged Hispanic male who is intubated and sedated.  In mild respiratory distress. . Afebrile. Head is nontraumatic. Neck is supple without bruit.    Cardiac exam no murmur or gallop. Lungs are clear to auscultation. Distal pulses are well felt.  Neurological Exam-intubated not sedated.  Eyes open.  Global aphasia, follows only occasional gaze to the left and up and down but not to the right.  Eyes left gaze deviation able to cross midline ,  not blinking to visual threat on the right, blinking to threat on the left,  able to track on the left but not right. Mild lower right facial droop.  Tongue protrusion not cooperative. Spontaneous movement of LUE and LLE, LUE at least 4/5, no drift when lifted in air. LLE 3/5 with pain stimulation. RUE flaccid, right-sided neglect, RLE slight withdraw to pain. DTR 1+. Right upgoing toe, left downgoing toe, . Sensation, coordination and gait not tested.   ASSESSMENT/PLAN [name] is a 59 y.o. male with history of HTN and DB noncompliant w/ medications presenting to [facility] with R sided weakness and inability to talk. NIH 28. CTA showed L M1 occlusion. Transferred to [facility]. [facility]. On arrival, received IV tPA 03/24/2020 at 0725 followed by mechanical thrombectomy w/ TICI2C revascularization and rescue stent placement.  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 Old L corona radiata and internal capsule infarcts. ASPECTS 10.     CTA head & neck large L MCA territory  infarct. L M1 LVO. Collateral vessels in sylvian fissure. B ICA bifurcation atherosclerosis. Aortic atherosclerosis.   CT perfusion large L MCA penumbra.   Cerebral angio L MCA M1 occlusion w/ TICI2c revascularization x 6 passes. Rescue stenting for reocclusion   CT head 4/21 - evolving large left MCA infarct, trace midline shift, persistent high density and left C1 fissure, contrast versus blood  MRI  Large L MCA infarct evolution w/ edema and mass effect w/ 45mm L midline shift. SAH L sylvian fissure, L cerebral cortical sulci and basilar cisterns.  MRA  L M1 stent w/o flow c/w reocclusion. L ICA filling defect c/w small thrombus. VBJ w/ infundibulum vs aneurysm.  CT head 4/23 continued evolution large L MCA infarct w/ increased edema, now w/ 5-33mm midline shift. Stable L SAH w/ trace blood in tentorium.    CT head 4/24 - stable 5 mm shift. Large Lt MCA infarct. Subarachnoid blood and/or contrast in and around the left Sylvian fissure.   CT Head 03/29/2020 - Sequelae of large Left MCA territory infarct remain stable since 03/27/2020. Rightward midline shift remains 5 mm.  2D Echo EF 55-60%. No source of embolus   LE doppler neg DVT  May consider TEE and loop recorder if neuro improvement later.   TCD bubble        LDL 55.1  TG 798->675->269 (on 4/22)   HgbA1c 9.2  SCDs for VTE prophylaxis  No antithrombotic prior to admission, now on aspirin 81 mg daily and Brilinta (ticagrelor) 90 mg bid given stent placement and L tICA thrombus.   Therapy recommendations:  SNF - possible CIR (consult pending)  Disposition:  pending   Cerebral edema Induced Hypernatremia  CT showed large left MCA infarct with minimal midline shift  MRI  Large L MCA infarct evolution w/ edema and mass effect w/ 85mm L midline shift.   CT head 4/23 continued evolution large L MCA infarct w/ increased edema, now w/ 5-34mm midline shift. Stable L SAH w/ trace blood in tentorium.   CT head 4/25  stable  On 3% saline @ 75 via peripheral line - may consider central line if needed - held due to hyperglycemia  Goal Na 150-155  Na 159   CT head  4/24 - stable   On Add free water flushes-100 cc every 4 hours -> now to every 2 hours for 1 day  -Stop 3% -> done  -Start normal saline at 100 cc an hour -> done  -Give a bolus of normal saline 500 cc now -> done -> repeat today  -Recheck sodium in 6 hours-> ordered every 6 hours  - CCM consulted for assistance with fluid/electrolyte status and tachypnea this morning  Acute Respiratory Failure   Intubated for IR, left intubated post IR for airway protection secondary to acute L MCA stroke  Extubation 4/22  CCM on board, re-consulted today (Sunday)  Tolerating well so far  Close monitoring - high risk for neuro decline and reintubation, tachypnea Sunday  Hypertensive Emergency  SBP > 210 on arrival  Home meds:  Non-compliant - med rec lists:  losartan 25   BP variable during the night w/ agitation  Cleviprex changed to cardene d/t elevated TG -> now off  BP goal < 160 given SAH -> resume home dose of Cozaar 25 mg daily.  Hydralazine and labetalol PRN . Long-term BP goal normotensive  Fever  Leukocytosis WBC 10.5  Temp - 102.5  CXR - 4/25 - Low lung volumes without radiographic evidence of acute cardiopulmonary disease.  Will order U/A - C/w UTI - start IV Rocephin  Respiratory Cx 4/25 Klebsiella PNA  Blood cultures - NGTD  Urine Culture - 30K yeast  Hyperlipidemia  Home meds:  Non-compliant -  pravachol 10, crestor 5  LDL 55.1, goal < 70  TG 798->675-> 269 (on 4/22)  On lipitor 80  Continue statin at discharge  Diabetes type II Uncontrolled  Home meds:  Noncompliant - med rec lists:  dapagliflozin 10, glipizide 5, actos 30, dapagliflozin 10 / metformin 1000  HgbA1c 9.2, goal < 7.0  CBGs  SSI 0-15 q4   Dysphagia . Secondary to stroke . NPO . Speech on board . Put on TF @ 40 . Has  Cortrak   Other Stroke Risk Factors  Hx ETOH use, alcohol level <10, will advise family that he should drink no more than 2 drink(s) a day  Substance abuse - UDS:  Benzos POSITIVE  Obesity, Body mass index is 29.42 kg/m., recommend weight loss, diet and exercise as appropriate   Other Active Problems  Thrombocytopenia PLT - resolved  Hypokalemia K 3.2    supplement   Hypophosphatemia Phos 2.2 - supplement  Urinary retention - I&O cath, foley prn  Code status - Full code  Hospital day # 7  Patient remains significantly aphasic with right hemiplegia despite successful revascularization.  His prognosis for making significant recovery is guarded.  And long discussion with the daughter and his wife at the present time they want to continue aggressive support and agreed to a trial of extubation and if he fails even reintubation and trach and PEG.  Discussed with critical care team.  Hopefully extubate in the next couple of days when stable as per CCM Plan transcranial Doppler bubble study at the bedside today to look for PFO. This patient is critically ill and at significant risk of neurological worsening, death and care requires constant monitoring of vital signs, hemodynamics,respiratory and cardiac monitoring, extensive review of multiple databases, frequent neurological assessment, discussion with family, other specialists and medical decision making of high complexity.I have made any additions or clarifications directly to the above note.This critical care time does not reflect procedure time, or teaching time or supervisory time of PA/NP/Med Resident  etc but could involve care discussion time.  I spent 30 minutes of neurocritical care time  in the care of  this patient.    Antony Contras, MD To contact Stroke Continuity provider, please refer to [email or web address] After hours, contact General Neurology

## 2020-03-31 NOTE — Evaluation (Addendum)
Occupational Therapy Treatment Patient Details Name: Brad Delacruz MRN: [id number] DOB: 1961-01-14 Today's Date: 03/31/2020    History of Present Illness 59 y.o. male past medical history of diabetes, hypertension which he does not take medication for, presented to the emergency room at [facility] with last known well of 5 AM when he was at work and was noted to have a sudden onset of right-sided weakness and inability to talk. Pt given TPA and s/p thrombectomy on 4/20. Pt remains intubated and restrained as of 4/22.   Clinical Impression   Upon arrival, pt supine and easy to arouse; wife and daughter at bedside. Pt continues to present with decreased cognition, balance, and functional use of R side. Pt requiring Total A for bed mobility and tolerating sitting at EOB with Max A for sitting balance. Pt with tendency for push to right. Pt requiring Total hand over hand for washing his hands; facilitating stimulation of RUE and bilateral coordination. Challenging visual tracking and attention to locate object and then reach out to grab it; hand over hand for targeted reach with LUE. Continue to recommend dc to CIR and will continue to follow acutely as admitted.  VSS on vent 40% FiO2    Follow Up Recommendations  CIR;Supervision/Assistance - 24 hour    Equipment Recommendations  Other (comment)(Defer to next venue)    Recommendations for Other Services PT consult;Rehab consult;Speech consult     Precautions / Restrictions Precautions Precautions: Fall Restrictions Weight Bearing Restrictions: No      Mobility Bed Mobility Overal bed mobility: Needs Assistance Bed Mobility: Supine to Sit;Rolling;Sit to Supine Rolling: Total assist   Supine to sit: Total assist Sit to supine: Total assist   General bed mobility comments: Total A to manage BLEs and elevate trunk  Transfers Overall transfer level: Needs assistance               General transfer comment: Defer  for safety    Balance Overall balance assessment: Needs assistance Sitting-balance support: No upper extremity supported;Feet supported Sitting balance-Leahy Scale: Zero Sitting balance - Comments: Mod-max A for sitting balance. Pushing to right Postural control: Posterior lean;Left lateral lean                                 ADL either performed or assessed with clinical judgement   ADL Overall ADL's : Needs assistance/impaired     Grooming: Wash/dry hands;Total assistance;Sitting Grooming Details (indicate cue type and reason): Mod-Max A for sitting balance. Total hand over hand to bring hands together and rub.                                General ADL Comments: Focused session on arousal, sitting balance, cognition, and visual tracking. Pt contineus to requiring Total A for ADLs and bed mobility.      Vision   Vision Assessment?: Vision impaired- to be further tested in functional context Additional Comments: Pt continues to present with left gaze. Poor visual attention. Able to focus on an object with Max cues but unable to sustain attention     Perception     Praxis      Pertinent Vitals/Pain Pain Assessment: Faces Faces Pain Scale: No hurt Pain Location: generalized Pain Descriptors / Indicators: Grimacing Pain Intervention(s): Monitored during session;Limited activity within patient's tolerance;Repositioned     Hand Dominance  Extremity/Trunk Assessment Upper Extremity Assessment Upper Extremity Assessment: RUE deficits/detail RUE Deficits / Details: No active movement at RUE. No reaction to painful stimuli. WFL for PROM RUE Coordination: decreased fine motor;decreased gross motor   Lower Extremity Assessment Lower Extremity Assessment: Defer to PT evaluation RLE Deficits / Details: positive babinski test, otherwise flacid, no AROM noted LLE Deficits / Details: pt mobilizes extremity against gravity, does follow command to lift  leg (command given in spanish)       Communication     Cognition Arousal/Alertness: Lethargic Behavior During Therapy: Flat affect Overall Cognitive Status: Difficult to assess                                 General Comments: Opening eyes to voices. Difficulty sustain attention during visual tracking task.    General Comments  VSS. vent 40% FiO2. Daughter and wife present throughout session    Exercises Exercises: Other exercises Other Exercises Other Exercises: Visual focusing to single object (blue flash light) and then providing hand over hand to reach and grasp object. 10 reps. Max cues Other Exercises: Cervical ROM   Shoulder Instructions      Home Living                                          Prior Functioning/Environment                   OT Problem List:        OT Treatment/Interventions:      OT Goals(Current goals can be found in the care plan section) Acute Rehab OT Goals Patient Stated Goal: Per family, return home safely OT Goal Formulation: With patient/family Time For Goal Achievement: 04/09/20 Potential to Achieve Goals: Good ADL Goals Pt Will Perform Grooming: with mod assist;sitting Pt Will Transfer to Toilet: with mod assist;with +2 assist;stand pivot transfer;bedside commode Additional ADL Goal #1: Pt will perform bed mobility with Min A +2 in preparation for ADLs Additional ADL Goal #2: Pt will tolerate sitting at EOB for ~10 minutes with Min A in preparation for ADLs  OT Frequency: Min 2X/week   Barriers to D/C:            Co-evaluation PT/OT/SLP Co-Evaluation/Treatment: Yes Reason for Co-Treatment: For patient/therapist safety;To address functional/ADL transfers   OT goals addressed during session: ADL's and self-care      AM-PAC OT "6 Clicks" Daily Activity     Outcome Measure Help from another person eating meals?: Total Help from another person taking care of personal grooming?:  Total Help from another person toileting, which includes using toliet, bedpan, or urinal?: Total Help from another person bathing (including washing, rinsing, drying)?: Total Help from another person to put on and taking off regular upper body clothing?: Total Help from another person to put on and taking off regular lower body clothing?: Total 6 Click Score: 6   End of Session Equipment Utilized During Treatment: Oxygen Nurse Communication: Mobility status  Activity Tolerance: Patient limited by fatigue Patient left: in bed;with call bell/phone within reach;with bed alarm set;with restraints reapplied  OT Visit Diagnosis: Unsteadiness on feet (R26.81);Other abnormalities of gait and mobility (R26.89);Muscle weakness (generalized) (M62.81);Pain;Hemiplegia and hemiparesis Hemiplegia - Right/Left: Right Hemiplegia - dominant/non-dominant: Dominant Hemiplegia - caused by: Cerebral infarction Pain - part of body: (generalized)  Time: 3709-6438 OT Time Calculation (min): 27 min Charges:  OT General Charges $OT Visit: 1 Visit OT Treatments $Self Care/Home Management : 8-22 mins  Chance Karam MSOT, OTR/L Acute Rehab Pager: [phone] Office: [phone]  Theodoro Grist Kindsey Eblin 03/31/2020, 4:18 PM

## 2020-03-31 NOTE — Progress Notes (Signed)
Initial Nutrition Assessment  DOCUMENTATION CODES:   Not applicable  INTERVENTION:   Vital AF 1.2 @ 70 ml/hr (1680 ml/hr) via Cortrak tube  Provides: 2016 kcal, 126 grams protein, and 1360 ml free water.  200 ml free water every 2 hours = 2400 Total free water: 3760 ml   NUTRITION DIAGNOSIS:   Inadequate oral intake related to inability to eat as evidenced by NPO status. Ongoing.   GOAL:   Patient will meet greater than or equal to 90% of their needs Meeting with TF  MONITOR:   TF tolerance, Labs  REASON FOR ASSESSMENT:   Consult Enteral/tube feeding initiation and management  ASSESSMENT:   Pt with PMH of DM, HTN which he does not take medication for admitted with L MCA s/p IR for thrombectomy and stent on 4/20.   Pt discussed during ICU rounds and with RN.   4/22 NG placed and started on TF 4/23 Cortrak placed 4/25 intubated   Patient is currently intubated on ventilator support MV: 11 L/min Temp (24hrs), Avg:100.4 F (38 C), Min:99.1 F (37.3 C), Max:102.5 F (39.2 C)  Medications reviewed and include: colace, folic acid, SSI, 6 units novolog every 4 hours, 30 units lantus BID, MVI, miralax, thiamine  Labs reviewed: Na 159 (H), K+ 3.4 (L), PO4: 2.2 (L)   TF: Vital AF 1.2 @ 40 ml/hr Provides: 1152 kcal and 72 grams protein  Diet Order:   Diet Order            Diet NPO time specified  Diet effective now              EDUCATION NEEDS:   No education needs have been identified at this time  Skin:  Skin Assessment: Reviewed RN Assessment  Last BM:  type 7 x 2 via rectal tube  Height:   Ht Readings from Last 1 Encounters:  03/30/20 5\' 5"  (1.651 m)    Weight:   Wt Readings from Last 1 Encounters:  03/31/20 80.2 kg    Ideal Body Weight:  61.8 kg  BMI:  Body mass index is 29.42 kg/m.  Estimated Nutritional Needs:   Kcal:  2161  Protein:  105-125 grams  Fluid:  > 2 L/day  2162., RD, LDN, CNSC See AMiON for contact  information

## 2020-04-01 DIAGNOSIS — J9600 Acute respiratory failure, unspecified whether with hypoxia or hypercapnia: Secondary | ICD-10-CM | POA: Diagnosis not present

## 2020-04-01 LAB — MAGNESIUM: Magnesium: 2.3 mg/dL (ref 1.7–2.4)

## 2020-04-01 LAB — BASIC METABOLIC PANEL
Anion gap: 11 (ref 5–15)
Anion gap: 6 (ref 5–15)
BUN: 27 mg/dL — ABNORMAL HIGH (ref 6–20)
BUN: 19 mg/dL (ref 6–20)
CO2: 20 mmol/L — ABNORMAL LOW (ref 22–32)
CO2: 28 mmol/L (ref 22–32)
Calcium: 8.5 mg/dL — ABNORMAL LOW (ref 8.9–10.3)
Calcium: 8.9 mg/dL (ref 8.9–10.3)
Chloride: 122 mmol/L — ABNORMAL HIGH (ref 98–111)
Chloride: 115 mmol/L — ABNORMAL HIGH (ref 98–111)
Creatinine, Ser: 0.99 mg/dL (ref 0.61–1.24)
Creatinine, Ser: 0.79 mg/dL (ref 0.61–1.24)
GFR calc Af Amer: >60 mL/min (ref >60)
GFR calc Af Amer: >60 mL/min (ref >60)
GFR calc non Af Amer: >60 mL/min (ref >60)
GFR calc non Af Amer: >60 mL/min (ref >60)
Glucose, Bld: 152 mg/dL — ABNORMAL HIGH (ref 70–99)
Glucose, Bld: 168 mg/dL — ABNORMAL HIGH (ref 70–99)
Potassium: 3 mmol/L — ABNORMAL LOW (ref 3.5–5.1)
Potassium: 3.3 mmol/L — ABNORMAL LOW (ref 3.5–5.1)
Sodium: 153 mmol/L — ABNORMAL HIGH (ref 135–145)
Sodium: 149 mmol/L — ABNORMAL HIGH (ref 135–145)

## 2020-04-01 LAB — CBC WITH DIFFERENTIAL/PLATELET
Abs Immature Granulocytes: 0.07 10*3/uL (ref 0.00–0.07)
Basophils Absolute: 0 10*3/uL (ref 0.0–0.1)
Basophils Relative: 0 %
Eosinophils Absolute: 0.2 10*3/uL (ref 0.0–0.5)
Eosinophils Relative: 2 %
HCT: 35.6 % — ABNORMAL LOW (ref 39.0–52.0)
Hemoglobin: 11.5 g/dL — ABNORMAL LOW (ref 13.0–17.0)
Immature Granulocytes: 1 %
Lymphocytes Relative: 15 %
Lymphs Abs: 1.5 10*3/uL (ref 0.7–4.0)
MCH: 30.5 pg (ref 26.0–34.0)
MCHC: 32.3 g/dL (ref 30.0–36.0)
MCV: 94.4 fL (ref 80.0–100.0)
Monocytes Absolute: 0.7 10*3/uL (ref 0.1–1.0)
Monocytes Relative: 6 %
Neutro Abs: 7.7 10*3/uL (ref 1.7–7.7)
Neutrophils Relative %: 76 %
Platelets: 127 10*3/uL — ABNORMAL LOW (ref 150–400)
RBC: 3.77 MIL/uL — ABNORMAL LOW (ref 4.22–5.81)
RDW: 13.7 % (ref 11.5–15.5)
WBC: 10.1 10*3/uL (ref 4.0–10.5)
nRBC: 0 % (ref 0.0–0.2)

## 2020-04-01 LAB — GLUCOSE, CAPILLARY
Glucose-Capillary: 140 mg/dL — ABNORMAL HIGH (ref 70–99)
Glucose-Capillary: 163 mg/dL — ABNORMAL HIGH (ref 70–99)
Glucose-Capillary: 166 mg/dL — ABNORMAL HIGH (ref 70–99)
Glucose-Capillary: 170 mg/dL — ABNORMAL HIGH (ref 70–99)
Glucose-Capillary: 186 mg/dL — ABNORMAL HIGH (ref 70–99)
Glucose-Capillary: 201 mg/dL — ABNORMAL HIGH (ref 70–99)

## 2020-04-01 LAB — PHOSPHORUS: Phosphorus: 2.3 mg/dL — ABNORMAL LOW (ref 2.5–4.6)

## 2020-04-01 MED ORDER — POTASSIUM CHLORIDE 20 MEQ/15ML (10%) PO SOLN
40.0000 meq | Freq: Once | ORAL | Status: AC
  Administered 2020-04-01: 22:00:00 40 meq
  Filled 2020-04-01: qty 30

## 2020-04-01 MED ORDER — ASPIRIN 81 MG PO CHEW
81.0000 mg | CHEWABLE_TABLET | Freq: Every day | ORAL | Status: DC
  Administered 2020-04-02 – 2020-04-15 (×14): 81 mg
  Filled 2020-04-01 (×14): qty 1

## 2020-04-01 MED ORDER — POTASSIUM CHLORIDE 20 MEQ/15ML (10%) PO SOLN
30.0000 meq | ORAL | Status: DC
  Administered 2020-04-01: 08:00:00 30 meq
  Filled 2020-04-01: qty 30

## 2020-04-01 MED ORDER — ORAL CARE MOUTH RINSE
15.0000 mL | Freq: Two times a day (BID) | OROMUCOSAL | Status: DC
  Administered 2020-04-01 – 2020-04-16 (×29): 15 mL via OROMUCOSAL

## 2020-04-01 MED ORDER — POTASSIUM PHOSPHATES 15 MMOLE/5ML IV SOLN
30.0000 mmol | Freq: Once | INTRAVENOUS | Status: AC
  Administered 2020-04-01: 30 mmol via INTRAVENOUS
  Filled 2020-04-01: qty 10

## 2020-04-01 MED ORDER — CHOLESTYRAMINE 4 G PO PACK
4.0000 g | PACK | Freq: Two times a day (BID) | ORAL | Status: DC
  Administered 2020-04-01 – 2020-04-08 (×15): 4 g
  Filled 2020-04-01 (×17): qty 1

## 2020-04-01 MED ORDER — TICAGRELOR 90 MG PO TABS
90.0000 mg | ORAL_TABLET | Freq: Two times a day (BID) | ORAL | Status: DC
  Administered 2020-04-02 – 2020-04-15 (×27): 90 mg
  Filled 2020-04-01 (×28): qty 1

## 2020-04-01 MED ORDER — POTASSIUM CHLORIDE 20 MEQ/15ML (10%) PO SOLN
40.0000 meq | ORAL | Status: AC
  Administered 2020-04-01: 40 meq
  Filled 2020-04-01: qty 30

## 2020-04-01 NOTE — Progress Notes (Signed)
Physical Therapy Treatment Patient Details Name: Brad Delacruz MRN: [id number] DOB: 1961-09-10 Today's Date: 04/01/2020    History of Present Illness 59 y.o. male past medical history of diabetes, hypertension which he does not take medication for, presented to the emergency room at [facility] with last known well of 5 AM when he was at work and was noted to have a sudden onset of right-sided weakness and inability to talk. Pt given TPA and s/p thrombectomy on 4/20. Pt remains intubated and restrained as of 4/22.    PT Comments    Pt lethargic during session, falling asleep while sitting at edge of bed. Pt demonstrates some improvement in command following, performing motor commands with max cues on L side of body. Pt family providing encouragement during session at bedside. Pt coughing up saliva at start of session and with watery respiratory sounds during session, PT attempts to encourage coughing and oral suction during session to no avail. Pt will continue to benefit from acute PT POC to improve mobility and reduce caregiver burden.   Follow Up Recommendations  Supervision/Assistance - 24 hour;CIR     Equipment Recommendations  Wheelchair (measurements PT);Wheelchair cushion (measurements PT);Hospital bed;Other (comment)    Recommendations for Other Services       Precautions / Restrictions Precautions Precautions: Fall Restrictions Weight Bearing Restrictions: No    Mobility  Bed Mobility Overal bed mobility: Needs Assistance Bed Mobility: Supine to Sit;Sit to Supine Rolling: Total assist   Supine to sit: Total assist        Transfers                    Ambulation/Gait                 Stairs             Wheelchair Mobility    Modified Rankin (Stroke Patients Only) Modified Rankin (Stroke Patients Only) Pre-Morbid Rankin Score: No symptoms Modified Rankin: Severe disability     Balance Overall balance assessment: Needs  assistance Sitting-balance support: Single extremity supported;Feet supported Sitting balance-Leahy Scale: Zero Sitting balance - Comments: maxA, pushing to right when LUE planted Postural control: Right lateral lean                                  Cognition Arousal/Alertness: Lethargic Behavior During Therapy: Flat affect Overall Cognitive Status: Impaired/Different from baseline Area of Impairment: Attention;Memory;Following commands;Safety/judgement;Awareness;Problem solving                   Current Attention Level: Focused Memory: Decreased recall of precautions;Decreased short-term memory Following Commands: Follows one step commands inconsistently(requires max cues) Safety/Judgement: Decreased awareness of safety;Decreased awareness of deficits Awareness: Intellectual Problem Solving: Slow processing;Requires verbal cues;Requires tactile cues;Decreased initiation;Difficulty sequencing General Comments: pt requries max verbal and tactile cues to follow commands      Exercises Other Exercises Other Exercises: L LAQ, max verbal and tactile cues, 5 reps in sitting Other Exercises: LUE shoulder flexion supine max verbal and tactile cues    General Comments General comments (skin integrity, edema, etc.): VSS on RA      Pertinent Vitals/Pain Pain Assessment: Faces Faces Pain Scale: No hurt    Home Living                      Prior Function            PT  Goals (current goals can now be found in the care plan section) Acute Rehab PT Goals Patient Stated Goal: Per family, return home safely Progress towards PT goals: Progressing toward goals    Frequency    Min 4X/week      PT Plan Current plan remains appropriate    Co-evaluation              AM-PAC PT "6 Clicks" Mobility   Outcome Measure  Help needed turning from your back to your side while in a flat bed without using bedrails?: Total Help needed moving from lying on  your back to sitting on the side of a flat bed without using bedrails?: Total Help needed moving to and from a bed to a chair (including a wheelchair)?: Total Help needed standing up from a chair using your arms (e.g., wheelchair or bedside chair)?: Total Help needed to walk in hospital room?: Total Help needed climbing 3-5 steps with a railing? : Total 6 Click Score: 6    End of Session   Activity Tolerance: Patient limited by fatigue Patient left: in bed;with call bell/phone within reach;with bed alarm set;with restraints reapplied;with family/visitor present Nurse Communication: Mobility status PT Visit Diagnosis: Muscle weakness (generalized) (M62.81);Other symptoms and signs involving the nervous system (R29.898);Hemiplegia and hemiparesis Hemiplegia - Right/Left: Right Hemiplegia - caused by: Cerebral infarction     Time: 1710-1734 PT Time Calculation (min) (ACUTE ONLY): 24 min  Charges:  $Therapeutic Exercise: 8-22 mins $Therapeutic Activity: 8-22 mins                     Zenaida Niece, PT, DPT Acute Rehabilitation Pager: [phone]    Zenaida Niece 04/01/2020, 5:46 PM

## 2020-04-01 NOTE — Progress Notes (Signed)
STROKE TEAM PROGRESS NOTE   INTERVAL HISTORY Patient remains intubated for respiratory failure.  Is more awake today though he remains aphasic and does not follow any commands for me consistently.  He did follow some gaze commands for his daughter when she spoke in [place].  Vital signs are stable.  Patient's wife is at the bedside and she wants full support including trach and PEG if necessary but states that she will look after him at home once he finishes rehab stay.  Wife and daughter are at bedside TCD bubble study was positive for a small PFO but lower extremity Dopplers done previously were negative for DVT Vitals:   04/01/20 1200 04/01/20 1300 04/01/20 1400 04/01/20 1500  BP: (!) 143/66 (!) 153/67 (!) 145/66 (!) 146/71  Pulse: 70 69 67 70  Resp: (!) 21 (!) 21 20 (!) 23  Temp: 99 F (37.2 C)     TempSrc: Axillary     SpO2: 98% 100% 99% 98%  Weight:      Height:        CBC:  Recent Labs  Lab 03/30/20 0418 03/30/20 0418 03/31/20 0355 04/01/20 0440  WBC 11.4*   < > 10.5 10.1  NEUTROABS 8.8*  --   --  7.7  HGB 15.8   < > 13.1 11.5*  HCT 48.2   < > 41.0 35.6*  MCV 96.2   < > 96.2 94.4  PLT 205   < > 166 127*   < > = values in this interval not displayed.    Basic Metabolic Panel:  Recent Labs  Lab 03/31/20 0355 04/01/20 0440  NA 159* 153*  K 3.4* 3.0*  CL 130* 122*  CO2 23 20*  GLUCOSE 222* 152*  BUN 37* 27*  CREATININE 1.44* 0.99  CALCIUM 8.1* 8.5*  MG 2.1 2.3  PHOS 2.2* 2.3*   IMAGING past 24 hours No results found.   PHYSICAL EXAM     Obese middle-aged Hispanic male who is intubated and sedated.  In mild respiratory distress. . Afebrile. Head is nontraumatic. Neck is supple without bruit.    Cardiac exam no murmur or gallop. Lungs are clear to auscultation. Distal pulses are well felt.  Neurological Exam-intubated not sedated.  Eyes open.  Global aphasia, follows only occasional gaze to the left and up and down but not to the right.  Eyes left gaze  deviation able to cross midline , not blinking to visual threat on the right, blinking to threat on the left,  able to track on the left but not right. Mild lower right facial droop.  Tongue protrusion not cooperative. Spontaneous movement of LUE and LLE, LUE at least 4/5, no drift when lifted in air. LLE 3/5 with pain stimulation. RUE flaccid, right-sided neglect, RLE slight withdraw to pain. DTR 1+. Right upgoing toe, left downgoing toe, . Sensation, coordination and gait not tested.   ASSESSMENT/PLAN Brad Delacruz is a 59 y.o. male with history of HTN and DB noncompliant w/ medications presenting to [facility] with R sided weakness and inability to talk. NIH 28. CTA showed L M1 occlusion. Transferred to [employer]. On arrival, received IV tPA 03/24/2020 at 0725 followed by mechanical thrombectomy w/ TICI2C revascularization and rescue stent placement.  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 Old L corona radiata and internal capsule infarcts. ASPECTS 10.     CTA head & neck large L MCA territory infarct. L M1 LVO. Collateral vessels in sylvian fissure. B ICA bifurcation atherosclerosis. Aortic atherosclerosis.   CT perfusion large L MCA penumbra.   Cerebral angio L MCA M1 occlusion w/ TICI2c revascularization x 6 passes. Rescue stenting for reocclusion   CT head 4/21 - evolving large left MCA infarct, trace midline shift, persistent high density and left C1 fissure, contrast versus blood  MRI  Large L MCA infarct evolution w/ edema and mass effect w/ 71mm L midline shift. SAH L sylvian fissure, L cerebral cortical sulci and basilar cisterns.  MRA  L M1 stent w/o flow c/w reocclusion. L ICA filling defect c/w small thrombus. VBJ w/ infundibulum vs  aneurysm.  CT head 4/23 continued evolution large L MCA infarct w/ increased edema, now w/ 5-73mm midline shift. Stable L SAH w/ trace blood in tentorium.    CT head 4/24 - stable 5 mm shift. Large Lt MCA infarct. Subarachnoid blood and/or contrast in and around the left Sylvian fissure.   CT Head 03/29/2020 - Sequelae of large Left MCA territory infarct remain stable since 03/27/2020. Rightward midline shift remains 5 mm.  2D Echo EF 55-60%. No source of embolus   LE doppler neg DVT  May consider TEE and loop recorder if neuro improvement later.   TCD bubble study positive for small PF)        LDL 55.1  TG 798->675->269 (on 4/22)   HgbA1c 9.2  SCDs for VTE prophylaxis  No antithrombotic prior to admission, now on aspirin 81 mg daily and Brilinta (ticagrelor) 90 mg bid given stent placement and L tICA thrombus.   Therapy recommendations:  SNF - possible CIR (consult pending)  Disposition:  pending   Cerebral edema Induced Hypernatremia  CT showed large left MCA infarct with minimal midline shift  MRI  Large L MCA infarct evolution w/ edema and mass effect w/ 71mm L midline shift.   CT head 4/23 continued evolution large L MCA infarct w/ increased edema, now w/ 5-65mm midline shift. Stable L SAH w/ trace blood in tentorium.   CT head 4/25 stable  On 3% saline @ 75 via peripheral line - may consider central line if needed - held due to hyperglycemia  Goal Na 150-155  Na 159   CT head 4/24 - stable   On Add free water flushes-100 cc every 4 hours -> now to every 2 hours for 1 day  -Stop 3% -> done  -Start normal saline at 100 cc an hour -> done  -Give a bolus of normal saline 500 cc now -> done -> repeat today  -Recheck sodium in 6 hours-> ordered every 6 hours  - CCM consulted for assistance with fluid/electrolyte status and tachypnea this morning  Acute Respiratory Failure   Intubated for IR, left intubated post IR for airway protection secondary to acute L  MCA stroke  Extubation 4/22  CCM on board, re-consulted today (Sunday)  Tolerating well so far  Close monitoring - high risk for neuro decline and reintubation, tachypnea Sunday  Hypertensive Emergency  SBP > 210 on arrival  Home meds:  Non-compliant - med rec lists:  losartan 25   BP variable during the night w/ agitation  Cleviprex changed to cardene d/t elevated TG -> now off  BP goal < 160 given SAH -> resume home dose of  Cozaar 25 mg daily.  Hydralazine and labetalol PRN . Long-term BP goal normotensive  Fever  Leukocytosis WBC 10.5  Temp - 102.5  CXR - 4/25 - Low lung volumes without radiographic evidence of acute cardiopulmonary disease.  Will order U/A - C/w UTI - start IV Rocephin  Respiratory Cx 4/25 Klebsiella PNA  Blood cultures - NGTD  Urine Culture - 30K yeast  Hyperlipidemia  Home meds:  Non-compliant -  pravachol 10, crestor 5  LDL 55.1, goal < 70  TG 798->675-> 269 (on 4/22)  On lipitor 80  Continue statin at discharge  Diabetes type II Uncontrolled  Home meds:  Noncompliant - med rec lists:  dapagliflozin 10, glipizide 5, actos 30, dapagliflozin 10 / metformin 1000  HgbA1c 9.2, goal < 7.0  CBGs  SSI 0-15 q4   Dysphagia . Secondary to stroke . NPO . Speech on board . Put on TF @ 40 . Has Cortrak   Other Stroke Risk Factors  Hx ETOH use, alcohol level <10, will advise family that he should drink no more than 2 drink(s) a day  Substance abuse - UDS:  Benzos POSITIVE  Obesity, Body mass index is 30.49 kg/m., recommend weight loss, diet and exercise as appropriate   Other Active Problems  Thrombocytopenia PLT - resolved  Hypokalemia K 3.2    supplement   Hypophosphatemia Phos 2.2 - supplement  Urinary retention - I&O cath, foley prn  Code status - Full code  Hospital day # 8  Patient remains significantly aphasic with right hemiplegia despite successful revascularization.  His prognosis for making significant  recovery is guarded.  I had a long discussion with the daughter and his wife at the present time they want to continue aggressive support and agreed to a trial of extubation and if he fails even reintubation and trach and PEG.  Discussed with critical care team.  Hopefully extubate today. D/W Dr Denese Killings  This patient is critically ill and at significant risk of neurological worsening, death and care requires constant monitoring of vital signs, hemodynamics,respiratory and cardiac monitoring, extensive review of multiple databases, frequent neurological assessment, discussion with family, other specialists and medical decision making of high complexity.I have made any additions or clarifications directly to the above note.This critical care time does not reflect procedure time, or teaching time or supervisory time of PA/NP/Med Resident etc but could involve care discussion time.  I spent 30 minutes of neurocritical care time  in the care of  this patient.    Delia Heady, MD To contact Stroke Continuity provider, please refer to [email or web address]. After hours, contact General Neurology

## 2020-04-01 NOTE — Progress Notes (Signed)
[facility] ADULT ICU REPLACEMENT PROTOCOL FOR AM LAB REPLACEMENT ONLY  The patient does apply for the [facility] Adult ICU Electrolyte Replacment Protocol based on the criteria listed below:   1. Is GFR >/= 40 ml/min? Yes.    Patient's GFR today is >60 2. Is urine output >/= 0.5 ml/kg/hr for the last 6 hours? Yes.   Patient's UOP is 1.3 ml/kg/hr 3. Is BUN < 60 mg/dL? Yes.    Patient's BUN today is 27 4. Abnormal electrolyte(s): K+3.0 5. Ordered repletion with: Protocol per tube 6. If a panic level lab has been reported, has the CCM MD in charge been notified? Yes.  .   Physician:  Reyne Dumas 04/01/2020 6:40 AM

## 2020-04-01 NOTE — Procedures (Signed)
Extubation Procedure Note  Patient Details:   Name: Brad Delacruz DOB: 05-07-1961 MRN: [id number]   Airway Documentation:    Vent end date: 04/01/20 Vent end time: 1028   Evaluation  O2 sats: stable throughout Complications: No apparent complications Patient did tolerate procedure well. Bilateral Breath Sounds: Clear, Diminished   Yes   Patient extubated per MD order. Positive cuff leak. No stridor noted. Vitals are stable on 4L [place]. RN at bedside.  Brad Delacruz H Armistead Sult 04/01/2020, 10:30 AM

## 2020-04-01 NOTE — Progress Notes (Signed)
SLP Cancellation Note  Patient Details Name: Demarkis Gheen MRN: [id number] DOB: [birth year]   Cancelled treatment:       Reason Eval/Treat Not Completed: Patient not medically ready. Pt remains intubated. Will follow for readiness   [name] 04/01/2020, 8:23 AM

## 2020-04-01 NOTE — Progress Notes (Signed)
NAME:  Brad Delacruz, MRN:  [id number], DOB:  1961/11/23, LOS: 8 ADMISSION DATE:  03/24/2020, CONSULTATION DATE:  03/24/20 REFERRING MD:  Corliss Skains, CHIEF COMPLAINT:  Vent management   Brief History   58yo male with hx DM, HTN presented 4/20 to [facility] with sudden onset R sided weakness and aphasia.  SBP >200.  CT revealed dense L MCA occlusion.  He was tx urgently to [facility], IV tPA given and underwent IR revascularization of occluded L MCA.  Pt left intubated post procedure and PCCM consulted for vent and ICU management.   Past Medical History   Past Medical History:  Diagnosis Date  . Diabetes mellitus without complication (HCC)      Significant Hospital Events   4/20: Admitted > S/p tPa and intracranial thrombectomy - rescue stenting of left MCA M1 segment occlusion achieving a TICI 2C revascularization 03/24/2020 by Dr. Corliss Skains.  Procedures:  4/20: Left MCA revascularization  4.21- Difficulties with BP control overnight. Agitated. Started on versed 10 mg/hr.  4/22 -Extubated  4/23 > Worsening midline shift to 5-40mm and 3% saline started by neuro via PIV 4/25 > Intubated for worsening neuro status and aspiration   Significant Diagnostic Tests:  CTA head 4/20>  Large left MCA territory nonhemorrhagic infarct. Acute left M1 large vessel occlusion. Large penumbra with mismatch volume of 99 mL. Collateral vessels are present within the sylvian fissure. CT head 4/23 1. Continued evolution of large Left MCA infarct with increased intracranial mass effect since 03/26/2019. Rightward midline shift now 5-6 mm. Basilar cisterns remain patent. 2. Stable left hemisphere subarachnoid hemorrhage and/or contrast, including trace blood layering along the midline tentorium.3. No new intracranial abnormality. CT Head 4/25: 1. Sequelae of large Left MCA territory infarct remain stable since 03/27/2020. Rightward midline shift remains 5 mm. 2. No new intracranial abnormality.  Micro Data:    4/20: SARS COVID negative  Sputum 4/25 >> NGTD Blood 4/25 >> NGTD Urine 4/25 >> NGTD  Antimicrobials:  none  Interim history/subjective:  Dexmedetomidine stopped yesterday. Neurologically unchanged - still moving semi-purposefully, but not following commands.  Objective   Blood pressure (!) 152/81, pulse 74, temperature (!) 100.6 F (38.1 C), temperature source Axillary, resp. rate 19, height 5\' 5"  (1.651 m), weight 83.1 kg, SpO2 100 %.    Vent Mode: PSV;CPAP FiO2 (%):  [40 %] 40 % Set Rate:  [16 bmp] 16 bmp Vt Set:  [490 mL] 490 mL PEEP:  [5 cmH20] 5 cmH20 Pressure Support:  [5 cmH20-8 cmH20] 5 cmH20 Plateau Pressure:  [11 cmH20] 11 cmH20   Intake/Output Summary (Last 24 hours) at 04/01/2020 0912 Last data filed at 04/01/2020 0800 Gross per 24 hour  Intake 3734.26 ml  Output 1900 ml  Net 1834.26 ml   Filed Weights   03/29/20 0500 03/31/20 0500 04/01/20 0500  Weight: 75.3 kg 80.2 kg 83.1 kg   Examintion General Appearance:  Adult male, no distress, on vent  HEENT: Dry MM, ETT/OG in place  Lungs: Clear chest, tolerating PSV, minimal secretions and strong cough with stimulation. Heart:  RRR, no MRG Abdomen:  Obese, soft, active bowel sounds. Copious diarrhea. Genitalia / Rectal:  Intact  Extremities:  -edema  Skin: intact  Neurologic: opens eyes to voice. Moves left side with normal strength purposefully but unable to follow commands.      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 Imaging  consistent with large completed stroke.  Don't expect significant short term recovery. Prognosis for long-term improvement is guarded. - Family to decide regarding tracheostomy and PEG vs transition to comfort care.    Critically ill due to acute hypoxic respiratory failure requiring mechanical ventilation due to aspiration pneumonitis due to impair swallowing from  CVA.\Plan - Trial of extubation today. Reintubate and trach if fails.  Iatrogenic hypernatremia due to 3% saline therapy.  -continue slow correction with free water.  May see further improvement in neurological status with this.    HTN  - titrating oral medications.   Daily Goals Checklist  Pain/Anxiety/Delirium protocol (if indicated): keep off all sedation Neuro vitals: every 4 hours AED's: none VAP protocol (if indicated): bundle in place Respiratory support goals: SBT daily. Trial of extubation if continues to show signs of waking up. Blood pressure target: 599-357 systolic  DVT prophylaxis: heparin tid Nutrition Status: moderate nutritional risk - on tube feeds at goal. Continue Cortrak.  Questran for diarrhea. GI prophylaxis: pantoprazole Fluid status goals: Continue free water as mildly volume contracted. Urinary catheter: external  Central lines: Peripherals only Glucose control: Type 2 DM with suboptimal control. Will increase Lantus and basal insulin Mobility/therapy needs: Progressive mobilization once mental status allows. Antibiotic de-escalation: Complete 5 days of cefepime. Home medication reconciliation: home oral DM agents on hold.  Daily labs: CBC, BMP Code Status: Full  Family Communication: Spoke to family yesterday in [place] and explained that current mental status is unlikely to change substantially for the next several months if ever. At this time, tracheostomy and PEG are indicated. I sense that patient prior wishes are at odds with the families inclinations. Disposition: ICU  CRITICAL CARE Performed by: Kipp Brood   Total critical care time: 40 minutes  Critical care time was exclusive of separately billable procedures and treating other patients.  Critical care was necessary to treat or prevent imminent or life-threatening deterioration.  Critical care was time spent personally by me on the following activities: development of treatment plan with  patient and/or surrogate as well as nursing, discussions with consultants, evaluation of patient's response to treatment, examination of patient, obtaining history from patient or surrogate, ordering and performing treatments and interventions, ordering and review of laboratory studies, ordering and review of radiographic studies, pulse oximetry, re-evaluation of patient's condition and participation in multidisciplinary rounds.  Kipp Brood, MD [facility] ICU Physician [place]  Pager: [phone] Mobile: [phone] After hours: [phone].

## 2020-04-01 NOTE — Progress Notes (Signed)
eLink Physician-Brief Progress Note Patient Name: Brad Delacruz DOB: November 28, 1961 MRN: [id number]   Date of Service  04/01/2020  HPI/Events of Note  K+ = 3.3 and Creatinine = 0.79.  eICU Interventions  Will replace K+.      Intervention Category Major Interventions: Electrolyte abnormality - evaluation and management  Nasteho Glantz [place] 04/01/2020, 8:25 PM

## 2020-04-02 DIAGNOSIS — I6602 Occlusion and stenosis of left middle cerebral artery: Secondary | ICD-10-CM | POA: Diagnosis not present

## 2020-04-02 DIAGNOSIS — J9600 Acute respiratory failure, unspecified whether with hypoxia or hypercapnia: Secondary | ICD-10-CM | POA: Diagnosis not present

## 2020-04-02 LAB — BASIC METABOLIC PANEL
Anion gap: 6 (ref 5–15)
BUN: 16 mg/dL (ref 6–20)
CO2: 25 mmol/L (ref 22–32)
Calcium: 8.7 mg/dL — ABNORMAL LOW (ref 8.9–10.3)
Chloride: 116 mmol/L — ABNORMAL HIGH (ref 98–111)
Creatinine, Ser: 0.75 mg/dL (ref 0.61–1.24)
GFR calc Af Amer: >60 mL/min (ref >60)
GFR calc non Af Amer: >60 mL/min (ref >60)
Glucose, Bld: 190 mg/dL — ABNORMAL HIGH (ref 70–99)
Potassium: 3.5 mmol/L (ref 3.5–5.1)
Sodium: 147 mmol/L — ABNORMAL HIGH (ref 135–145)

## 2020-04-02 LAB — CBC WITH DIFFERENTIAL/PLATELET
Abs Immature Granulocytes: 0.03 10*3/uL (ref 0.00–0.07)
Basophils Absolute: 0 10*3/uL (ref 0.0–0.1)
Basophils Relative: 0 %
Eosinophils Absolute: 0.2 10*3/uL (ref 0.0–0.5)
Eosinophils Relative: 2 %
HCT: 35.7 % — ABNORMAL LOW (ref 39.0–52.0)
Hemoglobin: 12.1 g/dL — ABNORMAL LOW (ref 13.0–17.0)
Immature Granulocytes: 0 %
Lymphocytes Relative: 15 %
Lymphs Abs: 1.4 10*3/uL (ref 0.7–4.0)
MCH: 31.2 pg (ref 26.0–34.0)
MCHC: 33.9 g/dL (ref 30.0–36.0)
MCV: 92 fL (ref 80.0–100.0)
Monocytes Absolute: 0.6 10*3/uL (ref 0.1–1.0)
Monocytes Relative: 6 %
Neutro Abs: 7.6 10*3/uL (ref 1.7–7.7)
Neutrophils Relative %: 77 %
Platelets: 139 10*3/uL — ABNORMAL LOW (ref 150–400)
RBC: 3.88 MIL/uL — ABNORMAL LOW (ref 4.22–5.81)
RDW: 12.6 % (ref 11.5–15.5)
WBC: 10 10*3/uL (ref 4.0–10.5)
nRBC: 0 % (ref 0.0–0.2)

## 2020-04-02 LAB — GLUCOSE, CAPILLARY
Glucose-Capillary: 175 mg/dL — ABNORMAL HIGH (ref 70–99)
Glucose-Capillary: 155 mg/dL — ABNORMAL HIGH (ref 70–99)
Glucose-Capillary: 216 mg/dL — ABNORMAL HIGH (ref 70–99)
Glucose-Capillary: 210 mg/dL — ABNORMAL HIGH (ref 70–99)
Glucose-Capillary: 187 mg/dL — ABNORMAL HIGH (ref 70–99)
Glucose-Capillary: 236 mg/dL — ABNORMAL HIGH (ref 70–99)

## 2020-04-02 LAB — PHOSPHORUS: Phosphorus: 1.9 mg/dL — ABNORMAL LOW (ref 2.5–4.6)

## 2020-04-02 MED ORDER — LOSARTAN POTASSIUM 50 MG PO TABS
25.0000 mg | ORAL_TABLET | Freq: Every day | ORAL | Status: DC
  Administered 2020-04-02 – 2020-04-07 (×6): 25 mg
  Filled 2020-04-02 (×7): qty 1

## 2020-04-02 MED ORDER — AMANTADINE HCL 50 MG/5ML PO SYRP
100.0000 mg | ORAL_SOLUTION | Freq: Two times a day (BID) | ORAL | Status: DC
  Administered 2020-04-02 – 2020-04-15 (×26): 100 mg
  Filled 2020-04-02 (×31): qty 10

## 2020-04-02 MED ORDER — POTASSIUM PHOSPHATES 15 MMOLE/5ML IV SOLN
40.0000 mmol | Freq: Once | INTRAVENOUS | Status: AC
  Administered 2020-04-02: 10:00:00 40 mmol via INTRAVENOUS
  Filled 2020-04-02: qty 13.33

## 2020-04-02 MED ORDER — CHLORHEXIDINE GLUCONATE 0.12 % MT SOLN
OROMUCOSAL | Status: AC
  Filled 2020-04-02: qty 15

## 2020-04-02 NOTE — TOC Benefit Eligibility Note (Signed)
Transition of Care [facility]) Benefit Eligibility Note    Patient Details  Name: Brad Delacruz MRN: [id number] Date of Birth: 08-26-1961   Medication/Dose: BRILINTA  90 MG BID  Covered?: Yes  Tier: (PREFERRED)  Prescription Coverage [facility]  Spoke with Person/Company/Phone Number:: GSPJSUN    @ [employer] # [phone]  Co-Pay: $ 77.82  Prior Approval: No  Deductible: Met  Additional Notes: TICAGRELOR : [name] Phone Number: 04/02/2020, 12:16 PM

## 2020-04-02 NOTE — TOC Initial Note (Signed)
Transition of Care [facility]) - Initial/Assessment Note    Patient Details  Name: Brad Delacruz MRN: [id number] Date of Birth: 1961-09-20  Transition of Care [facility] CM/SW Contact:    Glennon Mac, RN Phone Number: 04/02/2020, 11:41 AM  Clinical Narrative:  59 y.o. male past medical history of diabetes, hypertension which he does not take medication for, presented to the emergency room at [facility] with last known well of 5 AM when he was at work and was noted to have a sudden onset of right-sided weakness and inability to talk. Pt given TPA and s/p thrombectomy on 4/20. Pt now extubated.  PT/OT recommending CIR, and consult in process.  Family able to provide assistance at discharge; he has spouse and daughter.    CM referral to check coverage for Brilinta 90mg  BID.  Have submitted benefits check to obtain coverage information for medication.  Will follow with updates as available.                  Expected Discharge Plan: IP Rehab Facility Barriers to Discharge: Continued Medical Work up   Patient Goals and CMS Choice        Expected Discharge Plan and Services Expected Discharge Plan: IP Rehab Facility   Discharge Planning Services: CM Consult   Living arrangements for the past 2 months: Single Family Home                                      Prior Living Arrangements/Services Living arrangements for the past 2 months: Single Family Home Lives with:: Spouse Patient language and need for interpreter reviewed:: Yes Do you feel safe going back to the place where you live?: Yes      Need for Family Participation in Patient Care: Yes (Comment) Care giver support system in place?: Yes (comment)   Criminal Activity/Legal Involvement Pertinent to Current Situation/Hospitalization: No - Comment as needed  Activities of Daily Living Home Assistive Devices/Equipment: None ADL Screening (condition at time of admission) Patient's cognitive ability adequate to  safely complete daily activities?: Yes Is the patient deaf or have difficulty hearing?: No Does the patient have difficulty seeing, even when wearing glasses/contacts?: No Does the patient have difficulty concentrating, remembering, or making decisions?: No Patient able to express need for assistance with ADLs?: Yes Does the patient have difficulty dressing or bathing?: No Independently performs ADLs?: Yes (appropriate for developmental age) Does the patient have difficulty walking or climbing stairs?: No Weakness of Legs: None Weakness of Arms/Hands: None  Permission Sought/Granted                  Emotional Assessment Appearance:: Appears stated age Attitude/Demeanor/Rapport: Unable to Assess Affect (typically observed): Unable to Assess Orientation: : Oriented to Self      Admission diagnosis:  Cerebrovascular accident (CVA), unspecified mechanism (HCC) [I63.9] Acute ischemic stroke (HCC) [I63.9] CVA (cerebral vascular accident) (HCC) [I63.9] Middle cerebral artery embolism, left [I66.02] Patient Active Problem List   Diagnosis Date Noted  . Acute respiratory failure (HCC)   . Acute ischemic stroke (HCC) 03/24/2020  . CVA (cerebral vascular accident) (HCC) 03/24/2020  . Middle cerebral artery embolism, left 03/24/2020   PCP:  03/26/2020, MD [facility]acy:   [id number] [place] [place] [facility] [address] [facility] [address] [place] [facility] [place] Phone: [phone] Fax: [phone]     Social Determinants of Health (  SDOH) Interventions    Readmission Risk Interventions No flowsheet data found.  Reinaldo Raddle, RN, BSN  Trauma/Neuro ICU Case Manager [phone]

## 2020-04-02 NOTE — Progress Notes (Signed)
Nutrition Follow-up  DOCUMENTATION CODES:   Not applicable  INTERVENTION:   Vital AF 1.2 @ 70 ml/hr (1680 ml/hr) via Cortrak tube  Provides: 2016 kcal, 126 grams protein, and 1360 ml free water.  200 ml free water every 2 hours = 2400 Total free water: 3760 ml   NUTRITION DIAGNOSIS:   Inadequate oral intake related to inability to eat as evidenced by NPO status. Ongoing.   GOAL:   Patient will meet greater than or equal to 90% of their needs Meeting with TF  MONITOR:   TF tolerance, Labs  REASON FOR ASSESSMENT:   Consult Enteral/tube feeding initiation and management  ASSESSMENT:   Pt with PMH of DM, HTN which he does not take medication for admitted with L MCA s/p IR for thrombectomy and stent on 4/20.   Pt extubated but remains lethargic. Per SLP remain NPO with possible eval tomorrow. Cortrak remains gastric for medications and nutrition.   4/22 NG placed and started on TF 4/23 Cortrak placed; tip gastric  4/25 intubated  4/28 extubated 4/29 not ready for SLP eval; cortrak remains gastric   Medications reviewed and include: questran, colace, folic acid, SSI, 6 units novolog every 4 hours, 30 units lantus BID, MVI, thiamine  40 mmol Kphos x 1  Labs reviewed: Na 147 (H), PO4: 1.9 (L) CBG's: [phone]  Diet Order:   Diet Order            Diet NPO time specified  Diet effective now              EDUCATION NEEDS:   No education needs have been identified at this time  Skin:  Skin Assessment: Reviewed RN Assessment  Last BM:  750 ml via rectal tube  Height:   Ht Readings from Last 1 Encounters:  03/30/20 5\' 5"  (1.651 m)    Weight:   Wt Readings from Last 1 Encounters:  04/02/20 80.3 kg    Ideal Body Weight:  61.8 kg  BMI:  Body mass index is 29.46 kg/m.  Estimated Nutritional Needs:   Kcal:  2000-2200  Protein:  105-125 grams  Fluid:  > 2 L/day  04/04/20., RD, LDN, CNSC See AMiON for contact information

## 2020-04-02 NOTE — Progress Notes (Signed)
Inpatient Rehab Admissions:  Inpatient Rehab Consult received.  I met with patient and his daughter and his wife at the bedside for rehabilitation assessment and to discuss goals and expectations of an inpatient rehab admission.  Pt sleeping soundly throughout my visit.  Daughter interprets for her mother.  All are in agreement for pursuing CIR and they understand that disposition must be home with 24/7 assist.  Family will be able to provide this and are already working on making home more accessible to w/c in case it's needed.  Will open insurance for authorization for possible admission pending approval and bed availability.   Signed: Shann Medal, PT, DPT Admissions Coordinator [phone] 04/02/20  3:34 PM

## 2020-04-02 NOTE — Progress Notes (Signed)
STROKE TEAM PROGRESS NOTE   INTERVAL HISTORY Patient was extubated yesterday and has done well with his breathing. He remains drowsy but can be aroused with some effort. He follows very few simple commands in the midline and on the left side with a lot of cues. Continues to have dense right hemiplegia. Vital signs are stable.  Patient's wife is at the bedside and she wants full support including reintubation and trach and PEG if necessary but states that she will look after him at home once he finishes rehab stay.  Wife and daughter are at bedside   Vitals:   04/02/20 0900 04/02/20 1000 04/02/20 1100 04/02/20 1200  BP: (!) 146/62 139/63 (!) 145/79 133/69  Pulse: 72 70 (!) 57 71  Resp: (!) 27 (!) 22 (!) 21 (!) 21  Temp:    99.1 F (37.3 C)  TempSrc:    Axillary  SpO2: 100% 97% 97% 93%  Weight:      Height:        CBC:  Recent Labs  Lab 04/01/20 0440 04/02/20 0309  WBC 10.1 10.0  NEUTROABS 7.7 7.6  HGB 11.5* 12.1*  HCT 35.6* 35.7*  MCV 94.4 92.0  PLT 127* 139*    Basic Metabolic Panel:  Recent Labs  Lab 03/31/20 0355 03/31/20 0355 04/01/20 0440 04/01/20 0440 04/01/20 1920 04/02/20 0309  NA 159*   < > 153*   < > 149* 147*  K 3.4*   < > 3.0*   < > 3.3* 3.5  CL 130*   < > 122*   < > 115* 116*  CO2 23   < > 20*   < > 28 25  GLUCOSE 222*   < > 152*   < > 168* 190*  BUN 37*   < > 27*   < > 19 16  CREATININE 1.44*   < > 0.99   < > 0.79 0.75  CALCIUM 8.1*   < > 8.5*   < > 8.9 8.7*  MG 2.1  --  2.3  --   --   --   PHOS 2.2*   < > 2.3*  --   --  1.9*   < > = values in this interval not displayed.   IMAGING past 24 hours No results found.   PHYSICAL EXAM     Obese middle-aged Hispanic male who is.  In mild respiratory distress. . Afebrile. Head is nontraumatic. Neck is supple without bruit.    Cardiac exam no murmur or gallop. Lungs are clear to auscultation. Distal pulses are well felt.  Neurological Exam-lethargic and can be aroused with some difficulty..  Eyes closed.   Global aphasia, follows only occasional gaze to the left and up and down but not to the right.  Eyes left gaze deviation able to cross midline , not blinking to visual threat on the right, blinking to threat on the left,  able to track on the left but not right. Mild lower right facial droop.  Tongue protrusion not cooperative. Spontaneous movement of LUE and LLE, LUE at least 4/5, no drift when lifted in air. LLE 3/5 with pain stimulation. RUE flaccid, right-sided neglect, RLE slight withdraw to pain. DTR 1+. Right upgoing toe, left downgoing toe, . Sensation, coordination and gait not tested.   ASSESSMENT/PLAN [name] is a 59 y.o. male with history of HTN and DB noncompliant w/ medications presenting to [facility] with R sided weakness and inability to talk. NIH 28. CTA  showed L M1 occlusion. Transferred to [facility]. On arrival, received IV tPA 03/24/2020 at 0725 followed by mechanical thrombectomy w/ TICI2C revascularization and rescue stent placement.  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 Old L corona radiata and internal capsule infarcts. ASPECTS 10.     CTA head & neck large L MCA territory infarct. L M1 LVO. Collateral vessels in sylvian fissure. B ICA bifurcation atherosclerosis. Aortic atherosclerosis.   CT perfusion large L MCA penumbra.   Cerebral angio L MCA M1 occlusion w/ TICI2c revascularization x 6 passes. Rescue stenting for reocclusion   CT head 4/21 - evolving large left MCA infarct, trace midline shift, persistent high density and left C1 fissure, contrast versus blood  MRI  Large L MCA infarct evolution w/ edema and mass effect w/ 89mm L midline shift. SAH L sylvian fissure, L cerebral cortical sulci and basilar cisterns.  MRA  L  M1 stent w/o flow c/w reocclusion. L ICA filling defect c/w small thrombus. VBJ w/ infundibulum vs aneurysm.  CT head 4/23 continued evolution large L MCA infarct w/ increased edema, now w/ 5-66mm midline shift. Stable L SAH w/ trace blood in tentorium.    CT head 4/24 - stable 5 mm shift. Large Lt MCA infarct. Subarachnoid blood and/or contrast in and around the left Sylvian fissure.   CT Head 03/29/2020 - Sequelae of large Left MCA territory infarct remain stable since 03/27/2020. Rightward midline shift remains 5 mm.  2D Echo EF 55-60%. No source of embolus   LE doppler neg DVT  May consider TEE and loop recorder if neuro improvement later.   TCD bubble study positive for small PFO     LDL 55.1  TG 798->675->269 (on 4/22)   HgbA1c 9.2  SCDs for VTE prophylaxis  No antithrombotic prior to admission, now on aspirin 81 mg daily and Brilinta (ticagrelor) 90 mg bid given stent placement and L tICA thrombus.   Therapy recommendations:  SNF - possible CIR (consult pending)  Disposition:  pending   Cerebral edema Induced Hypernatremia  CT showed large left MCA infarct with minimal midline shift  MRI  Large L MCA infarct evolution w/ edema and mass effect w/ 44mm L midline shift.   CT head 4/23 continued evolution large L MCA infarct w/ increased edema, now w/ 5-79mm midline shift. Stable L SAH w/ trace blood in tentorium.   CT head 4/25 stable  On 3% saline @ 75 via peripheral line - may consider central line if needed - held due to hyperglycemia  Goal Na 150-155  Na 159   CT head 4/24 - stable   On Add free water flushes-100 cc every 4 hours -> now to every 2 hours for 1 day  -Stop 3% -> done  -Start normal saline at 100 cc an hour -> done  -Give a bolus of normal saline 500 cc now -> done -> repeat today  -Recheck sodium in 6 hours-> ordered every 6 hours  - CCM consulted for assistance with fluid/electrolyte status and tachypnea this morning  Acute Respiratory  Failure   Intubated for IR, left intubated post IR for airway protection secondary to acute L MCA stroke  Extubation 4/22  CCM on board, re-consulted today (Sunday)  Tolerating well so  far  Close monitoring - high risk for neuro decline and reintubation, tachypnea    Hypertensive Emergency  SBP > 210 on arrival  Home meds:  Non-compliant - med rec lists:  losartan 25   BP variable during the night w/ agitation  Cleviprex changed to cardene d/t elevated TG -> now off  BP goal < 160 given SAH -> resume home dose of Cozaar 25 mg daily.  Hydralazine and labetalol PRN . Long-term BP goal normotensive  Fever  Leukocytosis WBC 10.5  Temp - 102.5  CXR - 4/25 - Low lung volumes without radiographic evidence of acute cardiopulmonary disease.  Will order U/A - C/w UTI - start IV Rocephin  Respiratory Cx 4/25 Klebsiella PNA  Blood cultures - NGTD  Urine Culture - 30K yeast  Hyperlipidemia  Home meds:  Non-compliant -  pravachol 10, crestor 5  LDL 55.1, goal < 70  TG 798->675-> 269 (on 4/22)  On lipitor 80  Continue statin at discharge  Diabetes type II Uncontrolled  Home meds:  Noncompliant - med rec lists:  dapagliflozin 10, glipizide 5, actos 30, dapagliflozin 10 / metformin 1000  HgbA1c 9.2, goal < 7.0  CBGs  SSI 0-15 q4   Dysphagia . Secondary to stroke . NPO . Speech on board . Put on TF @ 40 . Has Cortrak   Other Stroke Risk Factors  Hx ETOH use, alcohol level <10, will advise family that he should drink no more than 2 drink(s) a day  Substance abuse - UDS:  Benzos POSITIVE  Obesity, Body mass index is 29.46 kg/m., recommend weight loss, diet and exercise as appropriate   Other Active Problems  Thrombocytopenia PLT - resolved  Hypokalemia K 3.2    supplement   Hypophosphatemia Phos 2.2 - supplement  Urinary retention - I&O cath, foley prn  Code status - Full code  Hospital day # 9  Patient remains significantly aphasic with right  hemiplegia despite successful revascularization.  His prognosis for making significant recovery is guarded.  I had a long discussion with the daughter and his wife at the present time they want to continue aggressive support and agreed to a trial of extubation and if he fails even reintubation and trach and PEG. Patient has so far done well after extubation yesterday discussed with critical care team. Transfer to progressive care unit today later if bed available.. D/W Dr Denese Killings  Greater than 50% time during this 35-minute visit were spent in counseling and coordination of care about his embolic stroke and resultant aphasia dysphagia and hemiplegia and prognosis and discussion with family and answering questions.   Delia Heady, MD To contact Stroke Continuity provider, please refer to [email or web address]. After hours, contact General Neurology

## 2020-04-02 NOTE — Progress Notes (Signed)
SLP Cancellation Note  Patient Details Name: Brad Delacruz MRN: [id number] DOB: Apr 10, 1961   Cancelled treatment:       Reason Eval/Treat Not Completed: Fatigue/lethargy limiting ability to participate. Pt now extubated, but somnolent per ICU RN, not alert enough for SLP evaluations. Pt has a Cortrak. Will plan to f/u tomorrow.   Harlon Ditty, MA CCC-SLP  Acute Rehabilitation Services Pager [phone] Office [phone]  [name] 04/02/2020, 10:48 AM

## 2020-04-02 NOTE — Progress Notes (Signed)
NAME:  Brad Delacruz, MRN:  [id number], DOB:  [birth year], LOS: 9 ADMISSION DATE:  03/24/2020, CONSULTATION DATE:  03/24/20 REFERRING MD:  Corliss Skains, CHIEF COMPLAINT:  Vent management   Brief History   59yo male with hx DM, HTN presented 4/20 to [facility] with sudden onset R sided weakness and aphasia.  SBP >200.  CT revealed dense L MCA occlusion.  He was tx urgently to [facility], IV tPA given and underwent IR revascularization of occluded L MCA.  Pt left intubated post procedure and PCCM consulted for vent and ICU management.   Past Medical History   Past Medical History:  Diagnosis Date  . Diabetes mellitus without complication (HCC)      Significant Hospital Events   4/20: Admitted > S/p tPa and intracranial thrombectomy - rescue stenting of left MCA M1 segment occlusion achieving a TICI 2C revascularization 03/24/2020 by Dr. Corliss Skains.  Procedures:  4/20: Left MCA revascularization  4.21- Difficulties with BP control overnight. Agitated. Started on versed 10 mg/hr.  4/22 -Extubated  4/23 > Worsening midline shift to 5-73mm and 3% saline started by neuro via PIV 4/25 > Intubated for worsening neuro status and aspiration   Significant Diagnostic Tests:  CTA head 4/20>  Large left MCA territory nonhemorrhagic infarct. Acute left M1 large vessel occlusion. Large penumbra with mismatch volume of 99 mL. Collateral vessels are present within the sylvian fissure. CT head 4/23 1. Continued evolution of large Left MCA infarct with increased intracranial mass effect since 03/26/2019. Rightward midline shift now 5-6 mm. Basilar cisterns remain patent. 2. Stable left hemisphere subarachnoid hemorrhage and/or contrast, including trace blood layering along the midline tentorium.3. No new intracranial abnormality. CT Head 4/25: 1. Sequelae of large Left MCA territory infarct remain stable since 03/27/2020. Rightward midline shift remains 5 mm. 2. No new intracranial abnormality.  Micro Data:    4/20: SARS COVID negative  Sputum 4/25 >> NGTD Blood 4/25 >> NGTD Urine 4/25 >> NGTD  Antimicrobials:  none  Interim history/subjective:  Extubated yesterday. Able to follow commands for PT yesterday. Currently somnolent.  Objective   Blood pressure 140/73, pulse (!) 57, temperature 98.8 F (37.1 C), temperature source Axillary, resp. rate (!) 24, height 5\' 5"  (1.651 m), weight 80.3 kg, SpO2 100 %.        Intake/Output Summary (Last 24 hours) at 04/02/2020 0830 Last data filed at 04/02/2020 0700 Gross per 24 hour  Intake 4621.18 ml  Output 4100 ml  Net 521.18 ml   Filed Weights   03/31/20 0500 04/01/20 0500 04/02/20 0500  Weight: 80.2 kg 83.1 kg 80.3 kg   Examintion General Appearance:  Adult male, no distress,   HEENT: Dry MM Lungs: Clear chest, Heart:  RRR, no MRG Abdomen:  Obese, soft, active bowel sounds. Copious diarrhea. Genitalia / Rectal:  Condom catheter. Extremities:  -edema  Skin: intact  Neurologic: opens eyes to voice. Moves left side with normal strength purposefully but unable to follow commands.      Assessment & Plan:   Was critically ill due to Acute L MCA stroke with rightward midline shift- s/p IV tPA and IR revascularization, now 7 days from event. Imaging consistent with large completed stroke.  Has done surprisingly well following extubation, may have greater rehabilitation potential.  -Ready for transfer to progressive care  Iatrogenic hypernatremia due to 3% saline therapy.  -continue slow correction with free water.  May see further improvement in neurological status with this.    HTN  - titrating oral medications.  Daily Goals Checklist  Pain/Anxiety/Delirium protocol (if indicated): keep off all sedation Neuro vitals: every 4 hours AED's: none VAP protocol (if indicated): not intubated Respiratory support goals: aspiration precautions Blood pressure target: 712-458 systolic  DVT prophylaxis: heparin tid Nutrition Status:  moderate nutritional risk - on tube feeds at goal. Continue Cortrak.  Questran for diarrhea. GI prophylaxis: pantoprazole Fluid status goals: Continue free water as mildly volume contracted. Urinary catheter: external  Central lines: Peripherals only Glucose control: Type 2 DM with suboptimal control. Will increase Lantus and basal insulin Mobility/therapy needs: Progressive mobilization once mental status allows. Antibiotic de-escalation: Complete 5 days of cefepime. Home medication reconciliation: home oral DM agents on hold.  Daily labs: CBC, BMP Code Status: Full  Family Communication: Spoke to daughter today. Disposition: 3W PCU   Kipp Brood, MD [facility] ICU Physician [place]  Pager: [phone] Mobile: [phone] After hours: [phone].

## 2020-04-02 NOTE — Progress Notes (Addendum)
Occupational Therapy Treatment Patient Details Name: Brad Delacruz MRN: [id number] DOB: 1961/07/13 Today's Date: 04/02/2020    History of present illness 59 y.o. male past medical history of diabetes, hypertension which he does not take medication for, presented to the emergency room at [facility] with last known well of 5 AM when he was at work and was noted to have a sudden onset of right-sided weakness and inability to talk. Pt given TPA and s/p thrombectomy on 4/20. Pt remains intubated and restrained as of 4/22.   OT comments  Upon arrival, pt supine in bed with daughter and wife at bed side. Pt continues to present with decreased arousal. Pt actively moving LUE and performing grooming with Total hand over hand. Pt with decreased following command this session but feel this is due to drowsiness. Educating family on overstimulation through multiple cues as well as touching and rubbing R hand to increase awareness. Continue to recommend dc to CIR and will continue to follow acutely as admitted.   Follow Up Recommendations  CIR;Supervision/Assistance - 24 hour    Equipment Recommendations  Other (comment)(Defer to next venue)    Recommendations for Other Services PT consult;Rehab consult;Speech consult    Precautions / Restrictions Precautions Precautions: Fall Restrictions Weight Bearing Restrictions: No       Mobility Bed Mobility               General bed mobility comments: Defered and focused on bed grooming due to lethargy  Transfers                 General transfer comment: Defer for safety    Balance Overall balance assessment: Needs assistance Sitting-balance support: Single extremity supported;Feet supported Sitting balance-Leahy Scale: Zero Sitting balance - Comments: Pushing to left wiht HOB elevated into chair position Postural control: Left lateral lean                                 ADL either performed or assessed  with clinical judgement   ADL Overall ADL's : Needs assistance/impaired     Grooming: Oral care;Total assistance;Bed level Grooming Details (indicate cue type and reason): Pt pushing back when providing Total hand over hand for oral care                               General ADL Comments: Focused session on bed level following of commands. Pt very lethargic and snooring loudly. Able to attend to family members when his name is called.      Vision   Vision Assessment?: Vision impaired- to be further tested in functional context   Perception     Praxis      Cognition Arousal/Alertness: Lethargic Behavior During Therapy: Flat affect Overall Cognitive Status: Impaired/Different from baseline Area of Impairment: Attention;Memory;Following commands;Safety/judgement;Awareness;Problem solving                   Current Attention Level: Focused Memory: Decreased recall of precautions;Decreased short-term memory Following Commands: Follows one step commands inconsistently(requires max cues) Safety/Judgement: Decreased awareness of safety;Decreased awareness of deficits Awareness: Intellectual Problem Solving: Slow processing;Requires verbal cues;Requires tactile cues;Decreased initiation;Difficulty sequencing General Comments: No following of commands. Very lethargic. Opening his eyes when asked by daughter and wife. Snooring despite moving LUE.        Exercises Exercises: General Upper Extremity General Exercises - Upper Extremity Shoulder  Flexion: PROM;Both;5 reps;Supine Elbow Flexion: PROM;Both;5 reps;Supine Elbow Extension: PROM;Both;5 reps;Supine   Shoulder Instructions       General Comments VSS on RA    Pertinent Vitals/ Pain       Pain Assessment: Faces Faces Pain Scale: No hurt Pain Location: generalized with transitions to EOB Pain Descriptors / Indicators: Grimacing Pain Intervention(s): Monitored during session;Limited activity within  patient's tolerance;Repositioned  Home Living                                          Prior Functioning/Environment              Frequency  Min 2X/week        Progress Toward Goals  OT Goals(current goals can now be found in the care plan section)  Progress towards OT goals: Progressing toward goals  Acute Rehab OT Goals Patient Stated Goal: Per family, return home safely OT Goal Formulation: With patient/family Time For Goal Achievement: 04/09/20 Potential to Achieve Goals: Good ADL Goals Pt Will Perform Grooming: with mod assist;sitting Pt Will Transfer to Toilet: with mod assist;with +2 assist;stand pivot transfer;bedside commode Additional ADL Goal #1: Pt will perform bed mobility with Min A +2 in preparation for ADLs Additional ADL Goal #2: Pt will tolerate sitting at EOB for ~10 minutes with Min A in preparation for ADLs  Plan Discharge plan remains appropriate    Co-evaluation                 AM-PAC OT "6 Clicks" Daily Activity     Outcome Measure   Help from another person eating meals?: Total Help from another person taking care of personal grooming?: Total Help from another person toileting, which includes using toliet, bedpan, or urinal?: Total Help from another person bathing (including washing, rinsing, drying)?: Total Help from another person to put on and taking off regular upper body clothing?: Total Help from another person to put on and taking off regular lower body clothing?: Total 6 Click Score: 6    End of Session    OT Visit Diagnosis: Unsteadiness on feet (R26.81);Other abnormalities of gait and mobility (R26.89);Muscle weakness (generalized) (M62.81);Pain;Hemiplegia and hemiparesis Hemiplegia - Right/Left: Right Hemiplegia - dominant/non-dominant: Dominant Hemiplegia - caused by: Cerebral infarction Pain - part of body: (generalized)   Activity Tolerance Patient limited by fatigue   Patient Left in bed;with  call bell/phone within reach;with bed alarm set;with family/visitor present;with restraints reapplied   Nurse Communication Mobility status        Time: 1194-1740 OT Time Calculation (min): 17 min  Charges: OT General Charges $OT Visit: 1 Visit OT Treatments $Therapeutic Activity: 8-22 mins  [place], OTR/L Acute Rehab Pager: [phone] Office: [place] 04/02/2020, 2:56 PM

## 2020-04-02 NOTE — Consult Note (Signed)
Physical Medicine and Rehabilitation Consult Reason for Consult: Right side weakness Referring Physician: Dr. Pearlean Brownie   HPI: Brad Delacruz is a 59 y.o. right-handed male with history of diabetes mellitus and hypertension.  Per chart review patient lives with spouse independent prior to admission.  Presented 03/24/2020 with right-sided weakness and aphasia to [facility].  Cranial CT scan negative for acute changes.  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  CT cerebral perfusion scan as well as CT angiogram head and neck showed a large left MCA territory nonhemorrhagic infarction as well as acute left M1 large vessel occlusion.  Large penumbra with mismatch volume of 99 mL.  Patient was transferred to [facility].  Patient underwent TPA followed by mechanical thrombectomy revascularization per interventional radiology.  Echocardiogram with ejection fraction of 60% grade 1 diastolic dysfunction.  TCD bubble study was positive for small PFO and lower extremity Dopplers negative for DVT.  Patient remained intubated through 04/01/2020.  Latest follow-up cranial CT scan 03/29/2020 sequelae of large left MCA territory infarction remaining stable rightward midline shift 5 mm .  Currently maintained on low-dose aspirin for CVA prophylaxis.  Patient n.p.o. with alternative means of nutritional support and consideration is being made for possible PEG tube..  Subcutaneous heparin for DVT prophylaxis.  Currently awaiting plan for possible TEE and need for loop recorder.  Patient spiked a low-grade fever 102.5 leukocytosis 10,500 respiratory culture [name] currently maintained on IV Rocephin urine culture 30,000 yeast.  Therapy evaluations completed with recommendations of physical medicine rehab consult.  Daughter reports he was able to move L leg this AM with therapy.  Also notices he grimaces to tactile stimulation of R arm.    Review of Systems  Unable  to perform ROS: Acuity of condition   Past Medical History:  Diagnosis Date  . Diabetes mellitus without complication [facility])    Past Surgical History:  Procedure Laterality Date  . IR CT HEAD LTD  03/24/2020  . IR CT HEAD LTD  03/24/2020  . IR INTRA CRAN STENT  03/24/2020  . IR PATIENT EVAL TECH 0-60 MINS  03/25/2020  . IR PERCUTANEOUS ART THROMBECTOMY/INFUSION INTRACRANIAL INC DIAG ANGIO  03/24/2020  . RADIOLOGY WITH ANESTHESIA N/A 03/24/2020   Procedure: IR WITH ANESTHESIA;  Surgeon: Julieanne Cotton, MD;  Location: [facility] OR;  Service: Radiology;  Laterality: N/A;   History reviewed. No pertinent family history. Social History:  reports that he has never smoked. He has never used smokeless tobacco. He reports previous alcohol use. He reports that he does not use drugs. Allergies: No Known Allergies Medications Prior to Admission  Medication Sig Dispense Refill  . ciclopirox (PENLAC) 8 % solution Apply topically as directed.    . dapagliflozin propanediol (FARXIGA) 10 MG TABS tablet Take 10 mg by mouth daily.    Marland Kitchen glipiZIDE (GLUCOTROL XL) 5 MG 24 hr tablet Take 5 mg by mouth 2 (two) times daily.    Marland Kitchen losartan (COZAAR) 25 MG tablet Take 25 mg by mouth daily.    . meloxicam (MOBIC) 15 MG tablet Take 15 mg by mouth daily.    . pioglitazone (ACTOS) 30 MG tablet Take 30 mg by mouth daily.    . rosuvastatin (CRESTOR) 5 MG tablet Take 5 mg by mouth daily.    Marland Kitchen XIGDUO XR 09-999 MG TB24 Take 1 tablet by mouth daily.      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 Information collected from daughter Functional Status:  Mobility: Bed Mobility Overal bed mobility: Needs Assistance Bed Mobility: Supine to Sit, Sit to Supine Rolling: Total assist Supine to sit: Total assist Sit to supine: Total assist General bed mobility comments: Total A to manage BLEs and elevate trunk Transfers Overall transfer level: Needs assistance Equipment used: 1 person hand held assist Transfers: Sit to/from Stand Sit to Stand: Total assist, From elevated surface General transfer comment: Defer for safety      ADL: ADL Overall ADL's : Needs assistance/impaired Grooming: Wash/dry hands, Total assistance, Sitting Grooming Details (indicate cue type and reason): Mod-Max A for sitting balance. Total hand over hand to bring hands together and rub.  General ADL Comments: Focused session on arousal, sitting balance, cognition, and visual tracking. Pt contineus to requiring Total A for ADLs and bed mobility.   Cognition: Cognition Overall Cognitive Status: Impaired/Different from baseline Arousal/Alertness: Lethargic Orientation Level: Oriented to person, Disoriented to place, Disoriented to time, Disoriented to situation Attention: Sustained Sustained Attention: Impaired Sustained Attention Impairment: Functional basic Cognition Arousal/Alertness: Lethargic Behavior During Therapy: Flat affect Overall Cognitive Status: Impaired/Different from baseline Area of Impairment: Attention, Memory, Following commands, Safety/judgement, Awareness, Problem solving Current Attention Level: Focused Memory: Decreased recall of precautions, Decreased short-term memory Following Commands: Follows one step commands inconsistently(requires max cues) Safety/Judgement: Decreased awareness of safety, Decreased awareness of deficits  Awareness: Intellectual Problem Solving: Slow processing, Requires verbal cues, Requires tactile cues, Decreased initiation, Difficulty sequencing General Comments: pt requries max verbal and tactile cues to follow commands Difficult to assess due to: Impaired communication  Blood pressure (!) 151/59, pulse 64, temperature 98.8 F (37.1 C), temperature source Axillary, resp. rate (!) 23, height 5\' 5"  (1.651 m), weight 83.1 kg, SpO2 100 %. Physical Exam  Nursing note and vitals reviewed. Constitutional: He appears well-developed and well-nourished.  Pt asleep - unable to awaken- daughter and wife at bedside, NAD  HENT:  Head: Normocephalic and atraumatic.  Nose: Nose normal.  Mouth/Throat: Oropharynx is clear and moist. No oropharyngeal exudate.  Has [place] O2 by nose- NGT in place Grimaces to noxious stimuli of RUE- R facial droop noted  Eyes: Right eye exhibits no discharge. Left eye exhibits no discharge.  Wouldn't open eyes, no matter the stimuli   Neck: No tracheal deviation present.  Cardiovascular:  RRR- borderline bradycardia- regular rhythm  Respiratory: No stridor.  Coarse breath sounds B/L- however no W/R/R Good air movement- O2 by [place]- RR- 25 or so and sats 97%  GI:  Has rectal tube in place Soft, NT, slightly distended?; hyperactive BS- very hyperactive  Genitourinary:    Genitourinary Comments: Condom catheter on penis No swelling of scrotum seen   Musculoskeletal:     Cervical back: Normal range of motion and neck supple.     Comments: Very sedated Was able to get pt to wiggle toes on R with tickling stimuli Same wiggling on L foot with stimuli L hand in mitten and wrist restraints; none on R side No movement of RUE, in spite of noxious stimuli- would  grimace and move LUE, but not RUE.   Neurological:  Lethargic but arousable.  Left gaze deviation.  Patient remained nonverbal throughout exam and did not follow commands.  Nonverbal, sedated (not on sedating meds)  Has some sensation to light touch in RUE_ because tried to get [name] to stop touching RUE.    Skin:  R hand somewhat swollen; trace swelling of L hand as well Some bruising noted of LUE ? Restraint, BP cuff? Has rectal tube  Psychiatric:  sedated    Results for orders placed or performed during the hospital encounter of 03/24/20 (from the past 24 hour(s))  Glucose, capillary     Status: Abnormal   Collection Time: 04/01/20  7:55 AM  Result Value Ref Range   Glucose-Capillary 186 (H) 70 - 99 mg/dL   Comment 1 Notify RN    Comment 2 Document in Chart   Glucose, capillary     Status: Abnormal   Collection Time: 04/01/20 11:45 AM  Result Value Ref Range   Glucose-Capillary 201 (H) 70 - 99 mg/dL   Comment 1 Notify RN    Comment 2 Document in Chart   Glucose, capillary     Status: Abnormal   Collection Time: 04/01/20  3:27 PM  Result Value Ref Range   Glucose-Capillary 166 (H) 70 - 99 mg/dL   Comment 1 Notify RN    Comment 2 Document in Chart   BMET today at 2000     Status: Abnormal   Collection Time: 04/01/20  7:20 PM  Result Value Ref Range   Sodium 149 (H) 135 - 145 mmol/L   Potassium 3.3 (L) 3.5 - 5.1 mmol/L   Chloride 115 (H) 98 - 111 mmol/L   CO2 28 22 - 32 mmol/L   Glucose, Bld 168 (H) 70 - 99 mg/dL   BUN 19 6 - 20 mg/dL   Creatinine, Ser 0.79 0.61 - 1.24 mg/dL   Calcium 8.9 8.9 - 10.3 mg/dL   GFR calc non Af Amer >60 >60 mL/min   GFR calc Af Amer >60 >60 mL/min   Anion gap 6 5 - 15  Glucose, capillary     Status: Abnormal   Collection Time: 04/01/20  7:47 PM  Result Value Ref Range   Glucose-Capillary 163 (H) 70 - 99 mg/dL  Glucose, capillary     Status: Abnormal   Collection Time: 04/01/20 11:34 PM  Result Value Ref Range   Glucose-Capillary 170 (H) 70 - 99 mg/dL  CBC with Differential/Platelet     Status: Abnormal   Collection Time: 04/02/20  3:09 AM  Result Value Ref Range   WBC 10.0 4.0 - 10.5 K/uL   RBC 3.88 (L) 4.22 - 5.81 MIL/uL   Hemoglobin 12.1 (L)  13.0 - 17.0 g/dL   HCT 35.7 (L) 39.0 - 52.0 %   MCV 92.0 80.0 - 100.0 fL   MCH 31.2 26.0 - 34.0 pg   MCHC 33.9 30.0 - 36.0 g/dL   RDW 12.6 11.5 - 15.5 %   Platelets 139 (L) 150 - 400 K/uL   nRBC 0.0 0.0 - 0.2 %   Neutrophils Relative % 77 %   Neutro Abs 7.6 1.7 - 7.7 K/uL   Lymphocytes Relative 15 %   Lymphs Abs 1.4 0.7 - 4.0 K/uL   Monocytes Relative 6 %   Monocytes Absolute 0.6 0.1 - 1.0 K/uL   Eosinophils Relative 2 %   Eosinophils Absolute 0.2 0.0 - 0.5 K/uL   Basophils Relative 0 %  Basophils Absolute 0.0 0.0 - 0.1 K/uL   Immature Granulocytes 0 %   Abs Immature Granulocytes 0.03 0.00 - 0.07 K/uL  Basic metabolic panel     Status: Abnormal   Collection Time: 04/02/20  3:09 AM  Result Value Ref Range   Sodium 147 (H) 135 - 145 mmol/L   Potassium 3.5 3.5 - 5.1 mmol/L   Chloride 116 (H) 98 - 111 mmol/L   CO2 25 22 - 32 mmol/L   Glucose, Bld 190 (H) 70 - 99 mg/dL   BUN 16 6 - 20 mg/dL   Creatinine, Ser 2.63 0.61 - 1.24 mg/dL   Calcium 8.7 (L) 8.9 - 10.3 mg/dL   GFR calc non Af Amer >60 >60 mL/min   GFR calc Af Amer >60 >60 mL/min   Anion gap 6 5 - 15  Phosphorus     Status: Abnormal   Collection Time: 04/02/20  3:09 AM  Result Value Ref Range   Phosphorus 1.9 (L) 2.5 - 4.6 mg/dL  Glucose, capillary     Status: Abnormal   Collection Time: 04/02/20  3:31 AM  Result Value Ref Range   Glucose-Capillary 175 (H) 70 - 99 mg/dL   DG CHEST PORT 1 VIEW  Result Date: 03/31/2020 CLINICAL DATA:  ET tube EXAM: PORTABLE CHEST 1 VIEW COMPARISON:  03/30/2020 FINDINGS: Endotracheal tube tip is 1.6 cm above the carina. Feeding tube is seen entering the stomach. Low lung volumes with bibasilar atelectasis and mild vascular congestion. No effusions or pneumothorax. No acute bony abnormality. IMPRESSION: Endotracheal tube 1.6 cm above the carina. Low lung volumes with vascular congestion and bibasilar atelectasis. Electronically Signed   By: Charlett Nose M.D.   On: 03/31/2020 08:38   VAS  [name] TRANSCRANIAL DOPPLER W BUBBLES  Result Date: 04/01/2020  Transcranial Doppler with Bubble Indications: Stroke. Comparison Study: No prior study Performing Technologist: Gertie Fey MHA, RDMS, RVT, RDCS  Examination Guidelines: A complete evaluation includes B-mode imaging, spectral Doppler, color Doppler, and power Doppler as needed of all accessible portions of each vessel. Bilateral testing is considered an integral part of a complete examination. Limited examinations for reoccurring indications may be performed as noted.  Summary:  A vascular evaluation was performed. The right middle cerebral artery was studied. An IV was inserted into the patient's right forearm. Verbal informed consent was obtained.  A small amount of HITS (high intensity transient signals) were detected, suggestive of trivial, clinically insignificant PFO (patent foramen ovale). Positive TCD Bubble study indicative of small right to left shunt *See table(s) above for TCD measurements and observations.  Diagnosing physician: Delia Heady MD Electronically signed by Delia Heady MD on 04/01/2020 at 6:53:24 AM.    Final      Assessment/Plan: Diagnosis: dense L MCA occlusion s/p TPA and IR revascularization with R hemiplegia and sedation  1. Does the need for close, 24 hr/day medical supervision in concert with the patient's rehab needs make it unreasonable for this patient to be served in a less intensive setting? Yes 2. Co-Morbidities requiring supervision/potential complications: DM, HTN, aphasia, R hemiplegia, dysphagia, confusion 3. Due to bladder management, bowel management, safety, skin/wound care, disease management, medication administration, pain management and patient education, does the patient require 24 hr/day rehab nursing? Yes 4. Does the patient require coordinated care of a physician, rehab nurse, therapy disciplines of PT, OT, and SLP to address physical and functional deficits in the context of the above  medical diagnosis(es)? Yes Addressing deficits in the following areas: balance, endurance,  locomotion, strength, transferring, bowel/bladder control, bathing, dressing, feeding, grooming, toileting, cognition, speech, language and swallowing 5. Can the patient actively participate in an intensive therapy program of at least 3 hrs of therapy per day at least 5 days per week? Yes 6. The potential for patient to make measurable gains while on inpatient rehab is good and fair 7. Anticipated functional outcomes upon discharge from inpatient rehab are supervision and min assist  with PT, supervision and min assist with OT, supervision and min assist with SLP. 8. Estimated rehab length of stay to reach the above functional goals is: 2-3 weeks 9. Anticipated discharge destination: Home 10. Overall Rehab/Functional Prognosis: good and fair  RECOMMENDATIONS: This patient's condition is appropriate for continued rehabilitative care in the following setting: CIR Patient has agreed to participate in recommended program. Potentially Note that insurance prior authorization may be required for reimbursement for recommended care.  Comment:  1. Pt is still sedated- just off ventilator yesterday- in ICU- was started on Amantadine 100 mg BID- suggest changing dosing to AM and noon, so not awake at night.   2. If BP stays controlled, and doesn't wake up, pt might benefit from low dose ritalin 2.5-5 mg via tube at breakfast time and lunch time.   3. Medical issues currently, per primary team.   4. Will continue to monitor remotely for timing of admission to CIR.   5. Thank you for this consult- he is an appropriate candidate once out of ICU and following some commands.     Mcarthur RossettiDaniel J [name], PA-C 04/02/2020   I have personally performed a face to face diagnostic evaluation of this patient and formulated the key components of the plan.  Additionally, I have personally reviewed laboratory data, imaging studies,  as well as relevant notes and concur with the physician assistant's documentation above.

## 2020-04-03 LAB — CULTURE, BLOOD (ROUTINE X 2)
Culture: NO GROWTH
Culture: NO GROWTH
Special Requests: ADEQUATE

## 2020-04-03 LAB — CBC WITH DIFFERENTIAL/PLATELET
Abs Immature Granulocytes: 0.06 10*3/uL (ref 0.00–0.07)
Basophils Absolute: 0.1 10*3/uL (ref 0.0–0.1)
Basophils Relative: 0 %
Eosinophils Absolute: 0.2 10*3/uL (ref 0.0–0.5)
Eosinophils Relative: 1 %
HCT: 35.1 % — ABNORMAL LOW (ref 39.0–52.0)
Hemoglobin: 12.2 g/dL — ABNORMAL LOW (ref 13.0–17.0)
Immature Granulocytes: 1 %
Lymphocytes Relative: 11 %
Lymphs Abs: 1.3 10*3/uL (ref 0.7–4.0)
MCH: 31.5 pg (ref 26.0–34.0)
MCHC: 34.8 g/dL (ref 30.0–36.0)
MCV: 90.7 fL (ref 80.0–100.0)
Monocytes Absolute: 0.6 10*3/uL (ref 0.1–1.0)
Monocytes Relative: 5 %
Neutro Abs: 10.1 10*3/uL — ABNORMAL HIGH (ref 1.7–7.7)
Neutrophils Relative %: 82 %
Platelets: 153 10*3/uL (ref 150–400)
RBC: 3.87 MIL/uL — ABNORMAL LOW (ref 4.22–5.81)
RDW: 12.3 % (ref 11.5–15.5)
WBC: 12.3 10*3/uL — ABNORMAL HIGH (ref 4.0–10.5)
nRBC: 0 % (ref 0.0–0.2)

## 2020-04-03 LAB — GLUCOSE, CAPILLARY
Glucose-Capillary: 196 mg/dL — ABNORMAL HIGH (ref 70–99)
Glucose-Capillary: 164 mg/dL — ABNORMAL HIGH (ref 70–99)
Glucose-Capillary: 137 mg/dL — ABNORMAL HIGH (ref 70–99)
Glucose-Capillary: 121 mg/dL — ABNORMAL HIGH (ref 70–99)
Glucose-Capillary: 122 mg/dL — ABNORMAL HIGH (ref 70–99)

## 2020-04-03 LAB — BASIC METABOLIC PANEL
Anion gap: 9 (ref 5–15)
BUN: 16 mg/dL (ref 6–20)
CO2: 25 mmol/L (ref 22–32)
Calcium: 9 mg/dL (ref 8.9–10.3)
Chloride: 109 mmol/L (ref 98–111)
Creatinine, Ser: 0.83 mg/dL (ref 0.61–1.24)
GFR calc Af Amer: >60 mL/min (ref >60)
GFR calc non Af Amer: >60 mL/min (ref >60)
Glucose, Bld: 212 mg/dL — ABNORMAL HIGH (ref 70–99)
Potassium: 3.2 mmol/L — ABNORMAL LOW (ref 3.5–5.1)
Sodium: 143 mmol/L (ref 135–145)

## 2020-04-03 LAB — PHOSPHORUS: Phosphorus: 2.4 mg/dL — ABNORMAL LOW (ref 2.5–4.6)

## 2020-04-03 MED ORDER — GERHARDT'S BUTT CREAM
TOPICAL_CREAM | CUTANEOUS | Status: DC | PRN
  Filled 2020-04-03: qty 1

## 2020-04-03 MED ORDER — FREE WATER
200.0000 mL | Status: DC
  Administered 2020-04-03 – 2020-04-04 (×4): 200 mL

## 2020-04-03 MED ORDER — CHLORHEXIDINE GLUCONATE 0.12 % MT SOLN
15.0000 mL | Freq: Two times a day (BID) | OROMUCOSAL | Status: DC
  Administered 2020-04-03 – 2020-04-16 (×27): 15 mL via OROMUCOSAL
  Filled 2020-04-03 (×20): qty 15

## 2020-04-03 NOTE — Progress Notes (Signed)
  Speech Language Pathology Treatment: Dysphagia;Cognitive-Linquistic  Patient Details Name: Brad Delacruz MRN: [id number] DOB: Apr 19, 1961 Today's Date: 04/03/2020 Time: 1000-1030 SLP Time Calculation (min) (ACUTE ONLY): 30 min  Assessment / Plan / Recommendation Clinical Impression  Pt seen with neurologist, wife and daughter at bedside. Plan is to await improved ability to sustain arousal over the next few days and attempt instrumental testing if possible early next week to determine prognosis for oral intake over a short term (rehabilitation stay). Initially pt appeared inappropriate to attempt therapy (snoring respirations), but with repositioning, total assist eye opening and head/neck support, pt was able to sustain gaze at provider and daughter (rolling horizontal eye movement at times), follow one step commands to squeeze SLP's hand, wash his face with tactile cues, and eventually orally manipulate an ice chips and initiate a swallow x3. Swallowing responses appeared very reflexive rather than volitional and were followed, promisingly, by a hard cough response. Pt shows promising indicators for some ability to swallow if arousal improves. Educated family on basic techniques they can use over the weekend to help Simonne Come have brief periods of arousal and attention. Will f/u on Monday.   HPI HPI: 59 y.o. male past medical history of diabetes, hypertension which he does not take medication for, presented to the emergency room at [facility] with last known well of 5 AM when he was at work and was noted to have a sudden onset of right-sided weakness and inability to talk. Pt given TPA and s/p thrombectomy on 4/20. Pt remains intubated and restrained as of 4/22.  PT was intubated from 4/20-4/22.  Head CT reported a large left MCA infarct.        SLP Plan  Continue with current plan of care       Recommendations  Diet recommendations: NPO                General recommendations:  Rehab consult Oral Care Recommendations: Oral care QID;Staff/trained caregiver to provide oral care Follow up Recommendations: Inpatient Rehab Plan: Continue with current plan of care       GO               Harlon Ditty, MA CCC-SLP  Acute Rehabilitation Services Pager [phone] Office [phone]  [name] 04/03/2020, 10:41 AM

## 2020-04-03 NOTE — Progress Notes (Signed)
Report given to [employer]. Patient drowsy but responsive to voice.Condom cath in place. Tube feed in rt. nare intact.

## 2020-04-03 NOTE — Progress Notes (Signed)
Pt corktrak in right nare keeps on beeping and showing line clogged. [employer] team notified and dietician to come assess line. Feeding tube turned off. Dionne Bucy RN

## 2020-04-03 NOTE — Progress Notes (Signed)
   04/03/20 0800  OT Visit Information  Last OT Received On 04/03/20  Assistance Needed +2  History of Present Illness 59 y.o. male past medical history of diabetes, hypertension which he does not take medication for, presented to the emergency room at [facility] with last known well of 5 AM when he was at work and was noted to have a sudden onset of right-sided weakness and inability to talk. Pt given TPA and s/p thrombectomy on 4/20. Pt remains intubated and restrained as of 4/22.   New OT order received and acknowledged. Will continue with POC.   Raynald Kemp, OT Acute Rehabilitation Services Pager: [phone] Office: [phone]

## 2020-04-03 NOTE — Evaluation (Signed)
Physical Therapy Evaluation Patient Details Name: Brad Delacruz MRN: [id number] DOB: [birth year] Today's Date: 04/03/2020   History of Present Illness  59 y.o. male past medical history of diabetes, hypertension which he does not take medication for, presented to the emergency room at [facility] with last known well of 5 AM when he was at work and was noted to have a sudden onset of right-sided weakness and inability to talk. Pt given TPA and s/p thrombectomy on 4/20.  Was intubated and restrained as of 4/22,  extubated 4/28 and now drowsy.  L MCA revascularization with tPA, acute respiratory failure from possible Asp PNA, now referred to rehab.  PMHx:  DM, HTN,   Clinical Impression  Pt was seen for limited evaluation due to his lethargy and limits of his lines.  Can assist with rolling in a limited effort to reach with RUE, but LUE fully assisted to move.  Pt is repositioned with midline alignment, has demonstrated L hip active ex but otherwise not using LE's on command.  Follow up on schedule to get to CIR if possible for recovery on R side strength and balance skills to make a transition to home with family.    Follow Up Recommendations CIR    Equipment Recommendations  Wheelchair (measurements PT);Wheelchair cushion (measurements PT);Hospital bed;Other (comment)    Recommendations for Other Services Rehab consult     Precautions / Restrictions Precautions Precautions: Fall Precaution Comments: telemetry, cortrak Restrictions Weight Bearing Restrictions: No Other Position/Activity Restrictions: 30 deg HOB elevation      Mobility  Bed Mobility Overal bed mobility: Needs Assistance Bed Mobility: Rolling Rolling: Total assist         General bed mobility comments: could not sit up on side of bed due to weakness and lack of pt assist  Transfers Overall transfer level: Needs assistance               General transfer comment: unable to sit up and attempt  standing today  Ambulation/Gait             General Gait Details: unable  Stairs            Wheelchair Mobility    Modified Rankin (Stroke Patients Only)       Balance Overall balance assessment: Needs assistance                                           Pertinent Vitals/Pain Pain Assessment: Faces Faces Pain Scale: No hurt    Home Living Family/patient expects to be discharged to:: Private residence Living Arrangements: Spouse/significant other;Children Available Help at Discharge: Family;Available 24 hours/day Type of Home: House Home Access: Stairs to enter Entrance Stairs-Rails: None Entrance Stairs-Number of Steps: 2-3 back steps Home Layout: One level Home Equipment: Crutches Additional Comments: information of home from chart as family is gone and pt not very alert    Prior Function Level of Independence: Independent         Comments: maintenance job     Hand Dominance   Dominant Hand: Right    Extremity/Trunk Assessment   Upper Extremity Assessment Upper Extremity Assessment: Defer to OT evaluation    Lower Extremity Assessment Lower Extremity Assessment: Generalized weakness RLE Deficits / Details: dense weakness with no initiation of any kind LLE Deficits / Details: extends his hip on command only  Communication   Communication: Expressive difficulties  Cognition Arousal/Alertness: Lethargic Behavior During Therapy: Flat affect Overall Cognitive Status: Impaired/Different from baseline Area of Impairment: Problem solving;Awareness;Following commands;Attention;Orientation                 Orientation Level: Situation Current Attention Level: Selective   Following Commands: Follows one step commands inconsistently;Follows one step commands with increased time   Awareness: Anticipatory;Intellectual Problem Solving: Slow processing;Decreased initiation;Requires verbal cues;Requires tactile  cues General Comments: could move L hip on command      General Comments General comments (skin integrity, edema, etc.): pt is quite lethargic, alternately with open eyes and then closed.  Was able to move on command to extend L hip and to roll with some trunk involvement    Exercises General Exercises - Lower Extremity Gluteal Sets: PROM;AAROM;10 reps Long Arc Quad: PROM;5 reps Heel Slides: PROM;10 reps Hip ABduction/ADduction: PROM;10 reps Hip Flexion/Marching: PROM;10 reps   Assessment/Plan    PT Assessment Patient needs continued PT services  PT Problem List Decreased strength;Decreased activity tolerance;Decreased balance;Decreased mobility;Decreased coordination;Decreased cognition;Decreased knowledge of use of DME;Decreased safety awareness;Decreased knowledge of precautions;Cardiopulmonary status limiting activity;Impaired sensation;Impaired tone       PT Treatment Interventions DME instruction;Gait training;Stair training;Functional mobility training;Therapeutic activities;Therapeutic exercise;Balance training;Neuromuscular re-education;Cognitive remediation;Patient/family education    PT Goals (Current goals can be found in the Care Plan section)       Frequency Min 4X/week   Barriers to discharge Inaccessible home environment      Co-evaluation               AM-PAC PT "6 Clicks" Mobility  Outcome Measure Help needed turning from your back to your side while in a flat bed without using bedrails?: A Lot Help needed moving from lying on your back to sitting on the side of a flat bed without using bedrails?: Total Help needed moving to and from a bed to a chair (including a wheelchair)?: Total Help needed standing up from a chair using your arms (e.g., wheelchair or bedside chair)?: Total Help needed to walk in hospital room?: Total Help needed climbing 3-5 steps with a railing? : Total 6 Click Score: 7    End of Session Equipment Utilized During Treatment:  Oxygen Activity Tolerance: Patient limited by fatigue;Treatment limited secondary to medical complications (Comment) Patient left: in bed;with call bell/phone within reach;with bed alarm set Nurse Communication: Mobility status PT Visit Diagnosis: Muscle weakness (generalized) (M62.81);Other symptoms and signs involving the nervous system (R29.898);Hemiplegia and hemiparesis Hemiplegia - Right/Left: Right Hemiplegia - dominant/non-dominant: Dominant Hemiplegia - caused by: Cerebral infarction    Time: 1204-1220 PT Time Calculation (min) (ACUTE ONLY): 16 min   Charges:   PT Evaluation $PT Eval Moderate Complexity: 1 Mod         Ivar Drape 04/03/2020, 3:58 PM  Samul Dada, PT MS Acute Rehab Dept. Number: [facility] [id number] and [facility] [phone]

## 2020-04-03 NOTE — Progress Notes (Signed)
PT Cancellation Note  Patient Details Name: Brad Delacruz MRN: [id number] DOB: 1961-12-05   Cancelled Treatment:    Reason Eval/Treat Not Completed: Other (comment).  Nursing in to see pt and asked PT to return later, will retry as time and pt allow.   Ivar Drape 04/03/2020, 11:22 AM   Samul Dada, PT MS Acute Rehab Dept. Number: [facility] [id number] and [facility] [phone]

## 2020-04-03 NOTE — Progress Notes (Signed)
STROKE TEAM PROGRESS NOTE   INTERVAL HISTORY Patient condition remains unchanged .he remains drowsy but can be aroused with some effort. He follows very few simple commands in the midline and on the left side with a lot of cues. Continues to have dense right hemiplegia. Vital signs are stable.  Patient's wife and daughter are  at the bedside and she wants full support including reintubation and trach and PEG if necessary but states that she will look after him at home once he finishes rehab stay.  Vitals:   04/03/20 0148 04/03/20 0325 04/03/20 0802 04/03/20 1156  BP:  (!) 172/79 (!) 152/72 108/81  Pulse:  81 86 78  Resp:  16 (!) 22 16  Temp:  98.3 F (36.8 C) 98.3 F (36.8 C) 98.3 F (36.8 C)  TempSrc:  Oral Axillary Axillary  SpO2:  99% 92% 96%  Weight: 84.6 kg     Height:        CBC:  Recent Labs  Lab 04/02/20 0309 04/03/20 0319  WBC 10.0 12.3*  NEUTROABS 7.6 10.1*  HGB 12.1* 12.2*  HCT 35.7* 35.1*  MCV 92.0 90.7  PLT 139* 153    Basic Metabolic Panel:  Recent Labs  Lab 03/31/20 0355 03/31/20 0355 04/01/20 0440 04/01/20 1920 04/02/20 0309 04/03/20 0319  NA 159*   < > 153*   < > 147* 143  K 3.4*   < > 3.0*   < > 3.5 3.2*  CL 130*   < > 122*   < > 116* 109  CO2 23   < > 20*   < > 25 25  GLUCOSE 222*   < > 152*   < > 190* 212*  BUN 37*   < > 27*   < > 16 16  CREATININE 1.44*   < > 0.99   < > 0.75 0.83  CALCIUM 8.1*   < > 8.5*   < > 8.7* 9.0  MG 2.1  --  2.3  --   --   --   PHOS 2.2*   < > 2.3*  --  1.9* 2.4*   < > = values in this interval not displayed.   IMAGING past 24 hours No results found.   PHYSICAL EXAM     Obese middle-aged Hispanic male who is.  In mild respiratory distress. . Afebrile. Head is nontraumatic. Neck is supple without bruit.    Cardiac exam no murmur or gallop. Lungs are clear to auscultation. Distal pulses are well felt.  Neurological Exam-lethargic and can be aroused with some difficulty..  Eyes closed.  Global aphasia, follows only  occasional gaze to the left and up and down but not to the right.  Eyes left gaze deviation able to cross midline , not blinking to visual threat on the right, blinking to threat on the left,  able to track on the left but not right. Mild lower right facial droop.  Tongue protrusion not cooperative. Spontaneous movement of LUE and LLE, LUE at least 4/5, no drift when lifted in air. LLE 3/5 with pain stimulation. RUE flaccid, right-sided neglect, RLE slight withdraw to pain. DTR 1+. Right upgoing toe, left downgoing toe, . Sensation, coordination and gait not tested.   ASSESSMENT/PLAN Brad Delacruz is a 59 y.o. male with history of HTN and DB noncompliant w/ medications presenting to [facility] with R sided weakness and inability to talk. NIH 28. CTA showed L M1 occlusion. Transferred to [facility]  On arrival, received IV tPA 03/24/2020 at 0725 followed by mechanical thrombectomy w/ TICI2C revascularization and rescue stent placement.  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 Old L corona radiata and internal capsule infarcts. ASPECTS 10.     CTA head & neck large L MCA territory infarct. L M1 LVO. Collateral vessels in sylvian fissure. B ICA bifurcation atherosclerosis. Aortic atherosclerosis.   CT perfusion large L MCA penumbra.   Cerebral angio L MCA M1 occlusion w/ TICI2c revascularization x 6 passes. Rescue stenting for reocclusion   CT head 4/21 - evolving large left MCA infarct, trace midline shift, persistent high density and left C1 fissure, contrast versus blood  MRI  Large L MCA infarct evolution w/ edema and mass effect w/ 62mm L midline shift. SAH L sylvian fissure, L cerebral cortical sulci and basilar cisterns.  MRA  L M1 stent w/o flow c/w  reocclusion. L ICA filling defect c/w small thrombus. VBJ w/ infundibulum vs aneurysm.  CT head 4/23 continued evolution large L MCA infarct w/ increased edema, now w/ 5-2mm midline shift. Stable L SAH w/ trace blood in tentorium.    CT head 4/24 - stable 5 mm shift. Large Lt MCA infarct. Subarachnoid blood and/or contrast in and around the left Sylvian fissure.   CT Head 03/29/2020 - Sequelae of large Left MCA territory infarct remain stable since 03/27/2020. Rightward midline shift remains 5 mm.  2D Echo EF 55-60%. No source of embolus   LE doppler neg DVT  May consider TEE and loop recorder if neuro improvement later.   TCD bubble study positive for small PFO     LDL 55.1  TG 798->675->269 (on 4/22)   HgbA1c 9.2  SCDs for VTE prophylaxis  No antithrombotic prior to admission, now on aspirin 81 mg daily and Brilinta (ticagrelor) 90 mg bid given stent placement and L tICA thrombus.   Therapy recommendations:  SNF - possible CIR (consult pending)  Disposition:  pending   Cerebral edema Induced Hypernatremia  CT showed large left MCA infarct with minimal midline shift  MRI  Large L MCA infarct evolution w/ edema and mass effect w/ 46mm L midline shift.   CT head 4/23 continued evolution large L MCA infarct w/ increased edema, now w/ 5-44mm midline shift. Stable L SAH w/ trace blood in tentorium.   CT head 4/25 stable  On 3% saline @ 75 via peripheral line - may consider central line if needed - held due to hyperglycemia  Goal Na 150-155  Na 159   CT head 4/24 - stable   On Add free water flushes-100 cc every 4 hours -> now to every 2 hours for 1 day  -Stop 3% -> done  -Start normal saline at 100 cc an hour -> done  -Give a bolus of normal saline 500 cc now -> done -> repeat today  -Recheck sodium in 6 hours-> ordered every 6 hours  - CCM consulted for assistance with fluid/electrolyte status and tachypnea this morning  Acute Respiratory Failure   Intubated  for IR, left intubated post IR for airway protection secondary to acute L MCA stroke  Extubation 4/22  CCM on board, re-consulted today (Sunday)  Tolerating well so far  Close monitoring - high risk for neuro decline and  reintubation, tachypnea    Hypertensive Emergency  SBP > 210 on arrival  Home meds:  Non-compliant - med rec lists:  losartan 25   BP variable during the night w/ agitation  Cleviprex changed to cardene d/t elevated TG -> now off  BP goal < 160 given SAH -> resume home dose of Cozaar 25 mg daily.  Hydralazine and labetalol PRN . Long-term BP goal normotensive  Fever  Leukocytosis WBC 10.5  Temp - 102.5  CXR - 4/25 - Low lung volumes without radiographic evidence of acute cardiopulmonary disease.  Will order U/A - C/w UTI - start IV Rocephin  Respiratory Cx 4/25 Klebsiella PNA  Blood cultures - NGTD  Urine Culture - 30K yeast  Hyperlipidemia  Home meds:  Non-compliant -  pravachol 10, crestor 5  LDL 55.1, goal < 70  TG 798->675-> 269 (on 4/22)  On lipitor 80  Continue statin at discharge  Diabetes type II Uncontrolled  Home meds:  Noncompliant - med rec lists:  dapagliflozin 10, glipizide 5, actos 30, dapagliflozin 10 / metformin 1000  HgbA1c 9.2, goal < 7.0  CBGs  SSI 0-15 q4   Dysphagia . Secondary to stroke . NPO . Speech on board . Put on TF @ 40 . Has Cortrak   Other Stroke Risk Factors  Hx ETOH use, alcohol level <10, will advise family that he should drink no more than 2 drink(s) a day  Substance abuse - UDS:  Benzos POSITIVE  Obesity, Body mass index is 31.04 kg/m., recommend weight loss, diet and exercise as appropriate   Other Active Problems  Thrombocytopenia PLT - resolved  Hypokalemia K 3.2    supplement   Hypophosphatemia Phos 2.2 - supplement  Urinary retention - I&O cath, foley prn  Code status - Full code  Hospital day # 10  Patient remains significantly aphasic with right hemiplegia despite  successful revascularization.  His prognosis for making significant recovery is guarded.  I had a long discussion with the daughter and his wife at the present time they want to continue aggressive support and agreed to a trial of extubation and if he fails even reintubation and trach and PEG.  Continue ongoing therapies.  Continue tube feeds and speech therapy follow-up.  Hopefully transfer to inpatient rehab sometime early next week if bed available after insurance approval.  Discussed with daughter, wife and rehab coordinator Greater than 50% time during this 25-minute visit were spent in counseling and coordination of care about his embolic stroke and resultant aphasia dysphagia and hemiplegia and prognosis and discussion with family and answering questions.   Antony Contras, MD To contact Stroke Continuity provider, please refer to [email or web address] After hours, contact General Neurology

## 2020-04-03 NOTE — Progress Notes (Signed)
Inpatient Rehab Admissions Coordinator:   Insurance requesting updated clinical information be sent when pt less lethargic.  Will f/u on Monday to see how he's doing.   Estill Dooms, PT, DPT Admissions Coordinator [phone] 04/03/20  4:05 PM

## 2020-04-04 ENCOUNTER — Inpatient Hospital Stay (HOSPITAL_COMMUNITY): Payer: BC Managed Care – PPO

## 2020-04-04 DIAGNOSIS — T83511S Infection and inflammatory reaction due to indwelling urethral catheter, sequela: Secondary | ICD-10-CM

## 2020-04-04 DIAGNOSIS — N390 Urinary tract infection, site not specified: Secondary | ICD-10-CM

## 2020-04-04 DIAGNOSIS — E876 Hypokalemia: Secondary | ICD-10-CM

## 2020-04-04 DIAGNOSIS — I10 Essential (primary) hypertension: Secondary | ICD-10-CM

## 2020-04-04 DIAGNOSIS — E870 Hyperosmolality and hypernatremia: Secondary | ICD-10-CM

## 2020-04-04 LAB — BASIC METABOLIC PANEL
Anion gap: 8 (ref 5–15)
BUN: 16 mg/dL (ref 6–20)
CO2: 24 mmol/L (ref 22–32)
Calcium: 9.1 mg/dL (ref 8.9–10.3)
Chloride: 109 mmol/L (ref 98–111)
Creatinine, Ser: 0.85 mg/dL (ref 0.61–1.24)
GFR calc Af Amer: >60 mL/min (ref >60)
GFR calc non Af Amer: >60 mL/min (ref >60)
Glucose, Bld: 194 mg/dL — ABNORMAL HIGH (ref 70–99)
Potassium: 3.2 mmol/L — ABNORMAL LOW (ref 3.5–5.1)
Sodium: 141 mmol/L (ref 135–145)

## 2020-04-04 LAB — CBC
HCT: 37 % — ABNORMAL LOW (ref 39.0–52.0)
Hemoglobin: 12.8 g/dL — ABNORMAL LOW (ref 13.0–17.0)
MCH: 31.2 pg (ref 26.0–34.0)
MCHC: 34.6 g/dL (ref 30.0–36.0)
MCV: 90.2 fL (ref 80.0–100.0)
Platelets: 181 10*3/uL (ref 150–400)
RBC: 4.1 MIL/uL — ABNORMAL LOW (ref 4.22–5.81)
RDW: 12.2 % (ref 11.5–15.5)
WBC: 12.1 10*3/uL — ABNORMAL HIGH (ref 4.0–10.5)
nRBC: 0 % (ref 0.0–0.2)

## 2020-04-04 LAB — GLUCOSE, CAPILLARY
Glucose-Capillary: 113 mg/dL — ABNORMAL HIGH (ref 70–99)
Glucose-Capillary: 126 mg/dL — ABNORMAL HIGH (ref 70–99)
Glucose-Capillary: 133 mg/dL — ABNORMAL HIGH (ref 70–99)
Glucose-Capillary: 135 mg/dL — ABNORMAL HIGH (ref 70–99)
Glucose-Capillary: 144 mg/dL — ABNORMAL HIGH (ref 70–99)
Glucose-Capillary: 166 mg/dL — ABNORMAL HIGH (ref 70–99)
Glucose-Capillary: 175 mg/dL — ABNORMAL HIGH (ref 70–99)

## 2020-04-04 LAB — SODIUM
Sodium: 142 mmol/L (ref 135–145)
Sodium: 143 mmol/L (ref 135–145)
Sodium: 143 mmol/L (ref 135–145)

## 2020-04-04 MED ORDER — VITAL AF 1.2 CAL PO LIQD
1000.0000 mL | ORAL | Status: DC
  Administered 2020-04-04 – 2020-04-06 (×2): 1000 mL
  Filled 2020-04-04: qty 1000

## 2020-04-04 MED ORDER — INSULIN GLARGINE 100 UNIT/ML ~~LOC~~ SOLN
25.0000 [IU] | Freq: Two times a day (BID) | SUBCUTANEOUS | Status: DC
  Administered 2020-04-04 – 2020-04-10 (×13): 25 [IU] via SUBCUTANEOUS
  Filled 2020-04-04 (×15): qty 0.25

## 2020-04-04 MED ORDER — POTASSIUM CHLORIDE 20 MEQ/15ML (10%) PO SOLN
20.0000 meq | ORAL | Status: AC
  Administered 2020-04-04 (×4): 20 meq
  Filled 2020-04-04 (×5): qty 15

## 2020-04-04 MED ORDER — INSULIN ASPART 100 UNIT/ML ~~LOC~~ SOLN
3.0000 [IU] | SUBCUTANEOUS | Status: DC
  Administered 2020-04-04 – 2020-04-09 (×28): 3 [IU] via SUBCUTANEOUS

## 2020-04-04 MED ORDER — SODIUM CHLORIDE 3 % IV SOLN
INTRAVENOUS | Status: DC
  Administered 2020-04-04 (×2): 50 mL/h via INTRAVENOUS
  Administered 2020-04-05 – 2020-04-07 (×8): 75 mL/h via INTRAVENOUS
  Filled 2020-04-04 (×12): qty 500

## 2020-04-04 NOTE — Progress Notes (Signed)
Pt pending ICU transfer, Charge nurse verified empty bed. Pt going to room 4N room 18.Writer called to give report. Awaiting call back from the unit. 3% Sodium Chloride hung by RR nurse earlier in the shift. Infusion continues. Patient stable at this time, eyes opening to external stimuli. Respirations non-labored and regular. Wife at bedside and updated on patient condition.

## 2020-04-04 NOTE — Progress Notes (Signed)
STROKE TEAM PROGRESS NOTE   INTERVAL HISTORY Patient RN is at bedside. Pt lethargic, restless, constant moving left UE and LE but not right UE or LE. Eyes open on voice, not following commands. CT showed worsening MLS, started on 3% saline. Free water discontinued.    Vitals:   04/04/20 0813 04/04/20 1000 04/04/20 1200 04/04/20 1256  BP: 131/74  125/81   Pulse: 76 87 79   Resp: 18 17    Temp:   97.7 F (36.5 C) 98.9 F (37.2 C)  TempSrc:   Oral Oral  SpO2: 96% 97% 94%   Weight:      Height:        CBC:  Recent Labs  Lab 04/02/20 0309 04/02/20 0309 04/03/20 0319 04/04/20 0406  WBC 10.0   < > 12.3* 12.1*  NEUTROABS 7.6  --  10.1*  --   HGB 12.1*   < > 12.2* 12.8*  HCT 35.7*   < > 35.1* 37.0*  MCV 92.0   < > 90.7 90.2  PLT 139*   < > 153 181   < > = values in this interval not displayed.    Basic Metabolic Panel:  Recent Labs  Lab 03/31/20 0355 03/31/20 0355 04/01/20 0440 04/01/20 1920 04/02/20 0309 04/02/20 0309 04/03/20 0319 04/03/20 0319 04/04/20 0406 04/04/20 1046  NA 159*   < > 153*   < > 147*   < > 143   < > 141 142  K 3.4*   < > 3.0*   < > 3.5   < > 3.2*  --  3.2*  --   CL 130*   < > 122*   < > 116*   < > 109  --  109  --   CO2 23   < > 20*   < > 25   < > 25  --  24  --   GLUCOSE 222*   < > 152*   < > 190*   < > 212*  --  194*  --   BUN 37*   < > 27*   < > 16   < > 16  --  16  --   CREATININE 1.44*   < > 0.99   < > 0.75   < > 0.83  --  0.85  --   CALCIUM 8.1*   < > 8.5*   < > 8.7*   < > 9.0  --  9.1  --   MG 2.1  --  2.3  --   --   --   --   --   --   --   PHOS 2.2*   < > 2.3*  --  1.9*  --  2.4*  --   --   --    < > = values in this interval not displayed.   IMAGING past 24 hours CT HEAD WO CONTRAST  Result Date: 04/04/2020 CLINICAL DATA:  Stroke post revascularization, follow-up EXAM: CT HEAD WITHOUT CONTRAST TECHNIQUE: Contiguous axial images were obtained from the base of the skull through the vertex without intravenous contrast. COMPARISON:   03/29/2020 FINDINGS: Brain: Evolving large left MCA territory infarction is again identified. There is decreased hyperdensity in the left sylvian fissure and left cortical sulci reflecting subarachnoid space blood/contrast. Areas of mild gyriform hyperdensity likely reflect petechial hemorrhage. No new hemorrhage. Edema and mass effect have increased with greater subfalcine herniation; rightward midline shift now measures 9 mm. Slightly increased size of  the right temporal horn suggesting early trapping. Mild medialization of the left uncus. Vascular: No new finding.  Left MCA stent is again noted. Skull: Calvarium is unremarkable. Sinuses/Orbits: No acute finding. Other: None. IMPRESSION: Evolving large left MCA territory infarction with probable petechial hemorrhage. Progression of edema and mass effect with midline shift now measuring 9 mm and probable early trapping of the right lateral ventricle. Electronically Signed   By: Guadlupe Spanish M.D.   On: 04/04/2020 08:02     PHYSICAL EXAM      Temp:  [97.5 F (36.4 C)-98.9 F (37.2 C)] 98.9 F (37.2 C) (05/01 1256) Pulse Rate:  [71-91] 71 (05/01 1400) Resp:  [16-23] 23 (05/01 1400) BP: (125-143)/(69-86) 138/69 (05/01 1400) SpO2:  [94 %-100 %] 95 % (05/01 1400) Weight:  [83.6 kg] 83.6 kg (05/01 0424)  General - Well nourished, well developed, lethargic with mildly restless.  Ophthalmologic - fundi not visualized due to noncooperation.  Cardiovascular - Regular rhythm and rate.  Neuro - lethargic and can be aroused easily.  Eyes open but global aphasia, did not follow commands.  Eyes left gaze deviation able to cross midline slightly, not blinking to visual threat on the right, blinking to threat on the left,  able to track on the left but not right. Mild lower right facial droop.  Tongue protrusion not cooperative. Spontaneous movement of LUE and LLE, LUE at least 4/5, no drift when lifted in air. LLE 3/5 with pain stimulation. RUE flaccid,  right-sided neglect, RLE slight withdraw to pain. DTR 1+. Right upgoing toe, left downgoing toe. Sensation, coordination and gait not tested.   ASSESSMENT/PLAN [name] is a 59 y.o. male with history of HTN and DB noncompliant w/ medications presenting to [facility] with R sided weakness and inability to talk. NIH 28. CTA showed L M1 occlusion. Transferred to [facility] [employer]. [facility]. On arrival, received IV tPA 03/24/2020 at 0725 followed by mechanical thrombectomy w/ TICI2C revascularization and rescue stent placement.  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 Old L corona radiata and internal capsule infarcts. ASPECTS 10.     CTA head & neck large L MCA territory infarct. L M1 LVO. Collateral vessels in sylvian fissure. B ICA bifurcation atherosclerosis. Aortic atherosclerosis.   CT perfusion large L MCA penumbra.   Cerebral angio L MCA M1 occlusion w/ TICI2c revascularization x 6 passes. Rescue stenting for reocclusion   CT head 4/21 - evolving large left MCA infarct, trace midline shift, persistent high density and left C1 fissure, contrast versus blood  MRI  Large L MCA infarct evolution w/ edema and mass effect w/ 71mm L midline shift. SAH L sylvian fissure, L cerebral cortical sulci and basilar cisterns.  MRA  L M1 stent w/o flow c/w reocclusion. L ICA filling defect c/w small thrombus. VBJ w/ infundibulum vs aneurysm.  CT head 4/23 continued evolution large L MCA infarct w/ increased edema, now w/ 5-53mm midline shift. Stable L SAH w/ trace blood in tentorium.    CT head 4/24 - stable 5 mm shift. Large Lt MCA infarct. Subarachnoid blood and/or contrast in and around the left Sylvian fissure.   CT Head 03/29/2020 - Sequelae of large Left MCA territory infarct  remain stable since 03/27/2020. Rightward midline  shift remains 5 mm.  CT Head 04/04/2020 - Evolving large left MCA territory infarction with probable petechial hemorrhage. Progression of edema and mass effect with midline shift now measuring 9 mm and probable early trapping of the right lateral ventricle.  2D Echo EF 55-60%. No source of embolus   LE doppler neg DVT  May consider TEE and loop recorder if neuro improvement later.   TCD bubble study positive for clinically insignificant PFO     LDL 55.1  TG 798->675->269 (on 4/22)   HgbA1c 9.2  SCDs for VTE prophylaxis  No antithrombotic prior to admission, now on aspirin 81 mg daily and Brilinta (ticagrelor) 90 mg bid given stent placement and L tICA thrombus.   Therapy recommendations: CIR  Disposition:  pending   Cerebral edema Induced Hypernatremia  CT showed large left MCA infarct with minimal midline shift  MRI  Large L MCA infarct evolution w/ edema and mass effect w/ 60mm L midline shift.   CT head 4/23 continued evolution large L MCA infarct w/ increased edema, now w/ 5-82mm midline shift. Stable L SAH w/ trace blood in tentorium.   CT head 4/25 stable 52mm MLS  CT Head 04/04/2020 - Evolving large left MCA territory infarction with probable petechial hemorrhage. Progression of edema and mass effect with midline shift now measuring 9 mm and probable early trapping of the right lateral ventricle.  3% saline resumed @ 50 on 04/04/20  Goal Na 150-155  Na 159->141->143   Acute Respiratory Failure   Intubated for IR, left intubated post IR for airway protection secondary to acute L MCA stroke  Extubation 4/22  Tolerating well so far  Hypertensive Emergency  SBP > 210 on arrival  Home meds:  Non-compliant - med rec lists:  losartan 25   Cleviprex changed to cardene d/t elevated TG -> now off  BP goal < 160 given SAH  resumed home dose of Cozaar 25 mg daily . Long-term BP goal  normotensive  Hyperlipidemia  Home meds:  Non-compliant -  pravachol 10, crestor 5  LDL 55.1, goal < 70  TG 798->675-> 269 (on 4/22)  On lipitor 80  Continue statin at discharge  Diabetes type II Uncontrolled  Home meds:  Noncompliant - med rec lists:  dapagliflozin 10, glipizide 5, actos 30, dapagliflozin 10 / metformin 1000  HgbA1c 9.2, goal < 7.0  CBGs  Now on lantus 30->25 bid, novolog 6u->3u Q4h   SSI 0-15 q4   Dysphagia . Secondary to stroke . NPO . Speech on board . Put on TF @ 40 -> 70 -> 40 - insulin dose adjusted . Has Cortrak   Other Stroke Risk Factors  Hx ETOH use, alcohol level <10, will advise family that he should drink no more than 2 drink(s) a day  Substance abuse - UDS:  Benzos POSITIVE  Obesity, Body mass index is 30.67 kg/m., recommend weight loss, diet and exercise as appropriate   Other Active Problems  Thrombocytopenia PLT - resolved  Hypokalemia K 3.2  supplement   Urinary retention - I&O cath, foley prn  Code status - Full code  Hospital day # 11   This patient is critically ill due to large left MCA infarct s/p thrombectomy, cerebral edema, dysphagia and at significant risk of neurological worsening, death form recurrent stroke brain herniation, aspiration. This patient's care requires constant monitoring of vital signs, hemodynamics, respiratory and cardiac monitoring, review of multiple databases, neurological assessment, discussion with family, other specialists and medical decision making of  high complexity. I spent 35 minutes of neurocritical care time in the care of this patient.  Marvel Plan, MD PhD Stroke Neurology 04/04/2020 4:43 PM     To contact Stroke Continuity provider, please refer to [email or web address]. After hours, contact General Neurology

## 2020-04-05 DIAGNOSIS — D72829 Elevated white blood cell count, unspecified: Secondary | ICD-10-CM

## 2020-04-05 LAB — BASIC METABOLIC PANEL
Anion gap: 9 (ref 5–15)
BUN: 17 mg/dL (ref 6–20)
CO2: 22 mmol/L (ref 22–32)
Calcium: 8.9 mg/dL (ref 8.9–10.3)
Chloride: 113 mmol/L — ABNORMAL HIGH (ref 98–111)
Creatinine, Ser: 0.82 mg/dL (ref 0.61–1.24)
GFR calc Af Amer: >60 mL/min (ref >60)
GFR calc non Af Amer: >60 mL/min (ref >60)
Glucose, Bld: 154 mg/dL — ABNORMAL HIGH (ref 70–99)
Potassium: 3.6 mmol/L (ref 3.5–5.1)
Sodium: 144 mmol/L (ref 135–145)

## 2020-04-05 LAB — SODIUM
Sodium: 143 mmol/L (ref 135–145)
Sodium: 144 mmol/L (ref 135–145)
Sodium: 147 mmol/L — ABNORMAL HIGH (ref 135–145)
Sodium: 145 mmol/L (ref 135–145)

## 2020-04-05 LAB — CBC
HCT: 35 % — ABNORMAL LOW (ref 39.0–52.0)
Hemoglobin: 11.9 g/dL — ABNORMAL LOW (ref 13.0–17.0)
MCH: 30.8 pg (ref 26.0–34.0)
MCHC: 34 g/dL (ref 30.0–36.0)
MCV: 90.7 fL (ref 80.0–100.0)
Platelets: 196 10*3/uL (ref 150–400)
RBC: 3.86 MIL/uL — ABNORMAL LOW (ref 4.22–5.81)
RDW: 12 % (ref 11.5–15.5)
WBC: 10.2 10*3/uL (ref 4.0–10.5)
nRBC: 0 % (ref 0.0–0.2)

## 2020-04-05 LAB — GLUCOSE, CAPILLARY
Glucose-Capillary: 157 mg/dL — ABNORMAL HIGH (ref 70–99)
Glucose-Capillary: 152 mg/dL — ABNORMAL HIGH (ref 70–99)
Glucose-Capillary: 144 mg/dL — ABNORMAL HIGH (ref 70–99)
Glucose-Capillary: 93 mg/dL (ref 70–99)
Glucose-Capillary: 156 mg/dL — ABNORMAL HIGH (ref 70–99)
Glucose-Capillary: 131 mg/dL — ABNORMAL HIGH (ref 70–99)

## 2020-04-05 MED ORDER — SODIUM CHLORIDE 3 % IV BOLUS
250.0000 mL | Freq: Once | INTRAVENOUS | Status: AC
  Administered 2020-04-05: 13:00:00 250 mL via INTRAVENOUS
  Filled 2020-04-05: qty 250

## 2020-04-05 MED ORDER — ACETAMINOPHEN 325 MG PO TABS: 650.0000 mg | ORAL_TABLET | Freq: Four times a day (QID) | ORAL | Status: DC | PRN

## 2020-04-05 MED ORDER — ACETAMINOPHEN 160 MG/5ML PO SOLN
650.0000 mg | Freq: Four times a day (QID) | ORAL | Status: DC | PRN
  Administered 2020-04-05: 18:00:00 650 mg via ORAL
  Filled 2020-04-05: qty 20.3

## 2020-04-05 MED ORDER — SODIUM CHLORIDE 3 % IV BOLUS
250.0000 mL | Freq: Once | INTRAVENOUS | Status: AC
  Administered 2020-04-05: 250 mL via INTRAVENOUS
  Filled 2020-04-05: qty 250

## 2020-04-05 NOTE — Progress Notes (Signed)
STROKE TEAM PROGRESS NOTE   INTERVAL HISTORY RN at bedside. Pt neuro stable, unchanged. Now on 3% saline @ 75cc/h. Na 144 . On TF. Will repeat CT in am.   Vitals:   04/05/20 0400 04/05/20 0500 04/05/20 0600 04/05/20 0700  BP: (!) 141/75 126/67 132/81 (!) 153/77  Pulse: 82 67 71   Resp: 17 (!) 33 17   Temp:      TempSrc:      SpO2: 98% 99% 96%   Weight:      Height:        CBC:  Recent Labs  Lab 04/02/20 0309 04/02/20 0309 04/03/20 0319 04/03/20 0319 04/04/20 0406 04/05/20 0217  WBC 10.0   < > 12.3*   < > 12.1* 10.2  NEUTROABS 7.6  --  10.1*  --   --   --   HGB 12.1*   < > 12.2*   < > 12.8* 11.9*  HCT 35.7*   < > 35.1*   < > 37.0* 35.0*  MCV 92.0   < > 90.7   < > 90.2 90.7  PLT 139*   < > 153   < > 181 196   < > = values in this interval not displayed.    Basic Metabolic Panel:  Recent Labs  Lab 03/31/20 0355 03/31/20 0355 04/01/20 0440 04/01/20 1920 04/02/20 0309 04/02/20 0309 04/03/20 0319 04/03/20 0319 04/04/20 0406 04/04/20 1046 04/04/20 2024 04/05/20 0217  NA 159*   < > 153*   < > 147*   < > 143   < > 141   < > 143 143  144  K 3.4*   < > 3.0*   < > 3.5   < > 3.2*   < > 3.2*  --   --  3.6  CL 130*   < > 122*   < > 116*   < > 109   < > 109  --   --  113*  CO2 23   < > 20*   < > 25   < > 25   < > 24  --   --  22  GLUCOSE 222*   < > 152*   < > 190*   < > 212*   < > 194*  --   --  154*  BUN 37*   < > 27*   < > 16   < > 16   < > 16  --   --  17  CREATININE 1.44*   < > 0.99   < > 0.75   < > 0.83   < > 0.85  --   --  0.82  CALCIUM 8.1*   < > 8.5*   < > 8.7*   < > 9.0   < > 9.1  --   --  8.9  MG 2.1  --  2.3  --   --   --   --   --   --   --   --   --   PHOS 2.2*   < > 2.3*  --  1.9*  --  2.4*  --   --   --   --   --    < > = values in this interval not displayed.   IMAGING past 24 hours No results found.   Head CT WO Contrast 04/05/20 IMPRESSION: Evolving large left MCA territory infarction with probable petechial hemorrhage. Progression of edema and  mass effect with  midline shift now measuring 9 mm and probable early trapping of the right lateral Ventricle.   PHYSICAL EXAM      Temp:  [97.7 F (36.5 C)-100.2 F (37.9 C)] 99.4 F (37.4 C) (05/02 0342) Pulse Rate:  [63-87] 71 (05/02 0600) Resp:  [15-33] 17 (05/02 0600) BP: (125-160)/(67-93) 153/77 (05/02 0700) SpO2:  [94 %-100 %] 96 % (05/02 0600) Weight:  [77.9 kg] 77.9 kg (05/02 0347)  General - Well nourished, well developed, lethargic.  Ophthalmologic - fundi not visualized due to noncooperation.  Cardiovascular - Regular rhythm and rate.  Neuro - lethargic with eyes open but global aphasia, did not follow commands.  Eyes left gaze deviation able to cross midline slightly, not blinking to visual threat on the right, blinking to threat on the left,  able to track on the left but not right. Mild lower right facial droop.  Tongue protrusion not cooperative. Spontaneous movement of LUE and LLE, LUE at least 4/5, no drift when lifted in air. LLE 3/5 with pain stimulation. RUE flaccid, right-sided neglect, RLE slight withdraw to pain. DTR 1+. Right upgoing toe, left downgoing toe. Sensation, coordination and gait not tested.   ASSESSMENT/PLAN Brad Delacruz is a 59 y.o. male with history of HTN and DB noncompliant w/ medications presenting to [facility] with R sided weakness and inability to talk. NIH 28. CTA showed L M1 occlusion. Transferred to [facility]. [facility]. On arrival, received IV tPA 03/24/2020 at 0725 followed by mechanical thrombectomy w/ TICI2C revascularization and rescue stent placement.  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 Old L corona radiata and internal capsule infarcts. ASPECTS 10.     CTA head & neck large L MCA territory infarct.  L M1 LVO. Collateral vessels in sylvian fissure. B ICA bifurcation atherosclerosis. Aortic atherosclerosis.   CT perfusion large L MCA penumbra.   Cerebral angio L MCA M1 occlusion w/ TICI2c revascularization x 6 passes. Rescue stenting for reocclusion   CT head 4/21 - evolving large left MCA infarct, trace midline shift, persistent high density and left C1 fissure, contrast versus blood  MRI  Large L MCA infarct evolution w/ edema and mass effect w/ 39mm L midline shift. SAH L sylvian fissure, L cerebral cortical sulci and basilar cisterns.  MRA  L M1 stent w/o flow c/w reocclusion. L ICA filling defect c/w small thrombus. VBJ w/ infundibulum vs aneurysm.  CT head 4/23 continued evolution large L MCA infarct w/ increased edema, now w/ 5-44mm midline shift. Stable L SAH w/ trace blood in tentorium.    CT head 4/24 - stable 5 mm shift. Large Lt MCA infarct. Subarachnoid blood and/or contrast in and around the left Sylvian fissure.   CT Head 03/29/2020 - Sequelae of large Left MCA territory infarct remain stable since 03/27/2020. Rightward midline shift remains 5 mm.  CT Head 04/04/2020 - Evolving large left MCA territory infarction with probable petechial hemorrhage. Progression of edema and mass effect with midline shift now measuring 9 mm and probable early trapping of the right lateral ventricle.  2D Echo EF 55-60%. No source of embolus   LE doppler neg DVT  May consider TEE and loop recorder if neuro improvement later.   TCD bubble study positive for clinically insignificant PFO  LDL 55.1  TG 798->675->269 (on 4/22)   HgbA1c 9.2  SCDs for VTE prophylaxis  No antithrombotic prior to admission, now on aspirin 81 mg daily and Brilinta (ticagrelor) 90 mg bid given stent placement and L tICA thrombus.   Therapy recommendations: CIR  Disposition:  pending   Cerebral edema Induced Hypernatremia  CT showed large left MCA infarct with minimal midline shift  MRI  Large L MCA  infarct evolution w/ edema and mass effect w/ 20mm L midline shift.   CT head 4/23 continued evolution large L MCA infarct w/ increased edema, now w/ 5-24mm midline shift. Stable L SAH w/ trace blood in tentorium.   CT head 4/25 stable 73mm MLS  CT Head 04/04/2020 - Evolving large left MCA territory infarction with probable petechial hemorrhage. Progression of edema and mass effect with midline shift now measuring 9 mm and probable early trapping of the right lateral ventricle.  3% saline @ 50 -> 75 cc / hr  Goal Na 150-155  Na 141->143->144  Acute Respiratory Failure   Intubated for IR, left intubated post IR for airway protection secondary to acute L MCA stroke  Extubation 4/22  Tolerating well so far  Temp - 99.4 ; WBCs - 12.1->10.2  Ancef 4/27>> for pneumonia  Hypertensive Emergency  SBP > 210 on arrival  Home meds:  Non-compliant - med rec lists:  losartan 25   Cleviprex changed to cardene d/t elevated TG -> now off  BP goal < 160 given SAH  resumed home dose of Cozaar 25 mg daily . Long-term BP goal normotensive  Hyperlipidemia  Home meds:  Non-compliant -  pravachol 10, crestor 5  LDL 55.1, goal < 70  TG 798->675-> 269 (on 4/22)  On lipitor 80  Continue statin at discharge  Diabetes type II Uncontrolled  Home meds:  Noncompliant - med rec lists:  dapagliflozin 10, glipizide 5, actos 30, dapagliflozin 10 / metformin 1000  HgbA1c 9.2, goal < 7.0  CBGs  Now on lantus 30->25 bid, novolog 6u->3u Q4h   SSI 0-15 q4   Dysphagia . Secondary to stroke . NPO . Speech on board . Put on TF @ 40 -> 70 -> 40 - insulin dose adjusted . Has Cortrak   Other Stroke Risk Factors  Hx ETOH use, alcohol level <10, will advise family that he should drink no more than 2 drink(s) a day  Substance abuse - UDS:  Benzos POSITIVE  Obesity, Body mass index is 28.58 kg/m., recommend weight loss, diet and exercise as appropriate   Other Active  Problems  Thrombocytopenia PLT - resolved  Hypokalemia K 3.2  supplement ->3.6  Urinary retention - I&O cath, foley prn  Code status - Full code    Hospital day # 12   This patient is critically ill due to large left MCA infarct s/p thrombectomy, cerebral edema, dysphagia and at significant risk of neurological worsening, death form recurrent stroke brain herniation, aspiration. This patient's care requires constant monitoring of vital signs, hemodynamics, respiratory and cardiac monitoring, review of multiple databases, neurological assessment, discussion with family, other specialists and medical decision making of high complexity. I spent 35 minutes of neurocritical care time in the care of this patient.  Rosalin Hawking, MD PhD Stroke Neurology 04/05/2020 7:00 PM    To contact Stroke Continuity provider, please refer to [email or web address] After hours, contact General Neurology

## 2020-04-06 ENCOUNTER — Inpatient Hospital Stay (HOSPITAL_COMMUNITY): Payer: BC Managed Care – PPO

## 2020-04-06 LAB — CBC
HCT: 35.6 % — ABNORMAL LOW (ref 39.0–52.0)
Hemoglobin: 12.1 g/dL — ABNORMAL LOW (ref 13.0–17.0)
MCH: 31 pg (ref 26.0–34.0)
MCHC: 34 g/dL (ref 30.0–36.0)
MCV: 91.3 fL (ref 80.0–100.0)
Platelets: 257 10*3/uL (ref 150–400)
RBC: 3.9 MIL/uL — ABNORMAL LOW (ref 4.22–5.81)
RDW: 12.2 % (ref 11.5–15.5)
WBC: 10.5 10*3/uL (ref 4.0–10.5)
nRBC: 0 % (ref 0.0–0.2)

## 2020-04-06 LAB — GLUCOSE, CAPILLARY
Glucose-Capillary: 100 mg/dL — ABNORMAL HIGH (ref 70–99)
Glucose-Capillary: 141 mg/dL — ABNORMAL HIGH (ref 70–99)
Glucose-Capillary: 172 mg/dL — ABNORMAL HIGH (ref 70–99)
Glucose-Capillary: 107 mg/dL — ABNORMAL HIGH (ref 70–99)
Glucose-Capillary: 113 mg/dL — ABNORMAL HIGH (ref 70–99)
Glucose-Capillary: 146 mg/dL — ABNORMAL HIGH (ref 70–99)

## 2020-04-06 LAB — BASIC METABOLIC PANEL
Anion gap: 7 (ref 5–15)
BUN: 16 mg/dL (ref 6–20)
CO2: 25 mmol/L (ref 22–32)
Calcium: 9 mg/dL (ref 8.9–10.3)
Chloride: 112 mmol/L — ABNORMAL HIGH (ref 98–111)
Creatinine, Ser: 0.68 mg/dL (ref 0.61–1.24)
GFR calc Af Amer: >60 mL/min (ref >60)
GFR calc non Af Amer: >60 mL/min (ref >60)
Glucose, Bld: 125 mg/dL — ABNORMAL HIGH (ref 70–99)
Potassium: 3.5 mmol/L (ref 3.5–5.1)
Sodium: 144 mmol/L (ref 135–145)

## 2020-04-06 LAB — HEPATIC FUNCTION PANEL
ALT: 28 U/L (ref 0–44)
AST: 33 U/L (ref 15–41)
Albumin: 2.1 g/dL — ABNORMAL LOW (ref 3.5–5.0)
Alkaline Phosphatase: 78 U/L (ref 38–126)
Bilirubin, Direct: 0.1 mg/dL (ref 0.0–0.2)
Indirect Bilirubin: 0.2 mg/dL — ABNORMAL LOW (ref 0.3–0.9)
Total Bilirubin: 0.3 mg/dL (ref 0.3–1.2)
Total Protein: 6.4 g/dL — ABNORMAL LOW (ref 6.5–8.1)

## 2020-04-06 LAB — SODIUM
Sodium: 145 mmol/L (ref 135–145)
Sodium: 147 mmol/L — ABNORMAL HIGH (ref 135–145)
Sodium: 148 mmol/L — ABNORMAL HIGH (ref 135–145)

## 2020-04-06 NOTE — Plan of Care (Signed)
  Problem: Education: Goal: Knowledge of secondary prevention will improve Outcome: Progressing   

## 2020-04-06 NOTE — Progress Notes (Signed)
Inpatient Rehab Admissions Coordinator:   Note pt with neurological decline over the weekend.  Now on 3% saline in ICU.  Will continue to follow for timing of possible rehab admission pending medical improvement, insurance auth, and bed availability.   Estill Dooms, PT, DPT Admissions Coordinator [phone] 04/06/20  11:04 AM

## 2020-04-06 NOTE — Progress Notes (Signed)
  Speech Language Pathology Treatment: Dysphagia;Cognitive-Linquistic  Patient Details Name: Brad Delacruz MRN: [id number] DOB: 1961/05/01 Today's Date: 04/06/2020 Time: 4098-1191 SLP Time Calculation (min) (ACUTE ONLY): 21 min  Assessment / Plan / Recommendation Clinical Impression  Pt awake but intermittently drowsy without any stimuli. SLP provided trials of PO to determine progress toward oral intake. Pt had decreased automaticity of oral manipulation; there was prolonged oral holding of ice/puree with hand over hand motor feeding movement and tactile cueing to elicit mastication or oral transit. There was immediate coughing with ice, puree was more promising. Pt did not follow any verbal only commands, attempted to complete distal commands with groping behavior, needed total assist to form a thumbs up. Did not respond to Y/N with gestural response despite modeling and verbal cues. Will continue efforts. Keep NPO for now.   HPI HPI: 59 y.o. male past medical history of diabetes, hypertension which he does not take medication for, presented to the emergency room at [facility] with last known well of 5 AM when he was at work and was noted to have a sudden onset of right-sided weakness and inability to talk. Pt given TPA and s/p thrombectomy on 4/20. Pt remains intubated and restrained as of 4/22.  PT was intubated from 4/20-4/22.  Head CT reported a large left MCA infarct.        SLP Plan  Continue with current plan of care       Recommendations  Diet recommendations: NPO                General recommendations: Rehab consult Oral Care Recommendations: Oral care QID Follow up Recommendations: Inpatient Rehab SLP Visit Diagnosis: Aphasia (R47.01);Dysphagia, oropharyngeal phase (R13.12) Plan: Continue with current plan of care       GO                Oddie Bottger, Riley Nearing 04/06/2020, 2:34 PM

## 2020-04-06 NOTE — Progress Notes (Signed)
Physical Therapy Treatment Patient Details Name: Brad Delacruz MRN: [id number] DOB: March 22, 1961 Today's Date: 04/06/2020    History of Present Illness 59 y.o. male past medical history of diabetes, hypertension which he does not take medication for, presented to the emergency room at [facility] with last known well of 5 AM when he was at work and was noted to have a sudden onset of right-sided weakness and inability to talk. Pt given TPA and s/p thrombectomy on 4/20.  Was intubated and restrained as of 4/22,  extubated 4/28 and now drowsy.  L MCA revascularization with tPA, acute respiratory failure from possible Asp PNA, now referred to rehab.  PMHx:  DM, HTN,  On 5/2 pt with worsening of bleed to 25m, transferred to ICU and placed on 3% saline.    PT Comments    Pt with neurologic and functional decline. Pt opening eyes to name, noted R sided neglect, no active movement of R UE or LE, moves L UE purposefully but not to commands. Pt brings L LE on/off bed but also doesn't not move it to command. Pt remains dependent for all transfers and ADLs. May need to consider SNF if patient does not become more alert and interactive to participate in therapy. Acute PT to cont to follow.    Follow Up Recommendations  CIR     Equipment Recommendations  Wheelchair (measurements PT);Wheelchair cushion (measurements PT);Hospital bed;Other (comment)    Recommendations for Other Services Rehab consult     Precautions / Restrictions Precautions Precautions: Fall Precaution Comments: telemetry, cortrak Restrictions Weight Bearing Restrictions: No    Mobility  Bed Mobility Overal bed mobility: Needs Assistance Bed Mobility: Rolling;Sidelying to Sit Rolling: Total assist;+2 for physical assistance Sidelying to sit: Total assist;+2 for physical assistance       General bed mobility comments: pt with no initiation or command follow to participate in transfer to EOB, maxA to maintain EOB  balance  Transfers Overall transfer level: Needs assistance Equipment used: 2 person hand held assist(2 person lift with gait belt and bed pad) Transfers: Sit to/from Stand Sit to Stand: Max assist;+2 physical assistance         General transfer comment: maxA to power up, total assist to side step toward EOB, total assist to advance R LE, pt with noted L LE contraction to support self  Ambulation/Gait             General Gait Details: unable   Stairs             Wheelchair Mobility    Modified Rankin (Stroke Patients Only) Modified Rankin (Stroke Patients Only) Pre-Morbid Rankin Score: No symptoms Modified Rankin: Severe disability     Balance Overall balance assessment: Needs assistance Sitting-balance support: No upper extremity supported;Feet supported Sitting balance-Leahy Scale: Poor Sitting balance - Comments: pt initially pushing with L UE but stopped when instructed to stop, pt then with R lateral lean, requiring maxA to maintain EOB balance   Standing balance support: Single extremity supported;During functional activity Standing balance-Leahy Scale: Zero Standing balance comment: total assist x2                            Cognition Arousal/Alertness: Lethargic Behavior During Therapy: Flat affect Overall Cognitive Status: Impaired/Different from baseline Area of Impairment: Problem solving;Awareness;Following commands;Attention;Orientation                 Orientation Level: Disoriented to;Place;Time;Situation(turns head to name/dad/papi) Current Attention  Level: Focused   Following Commands: Follows one step commands inconsistently Safety/Judgement: Decreased awareness of safety Awareness: Intellectual Problem Solving: Slow processing;Decreased initiation;Difficulty sequencing;Requires tactile cues;Requires verbal cues General Comments: pt with inconsistent command follow, R sided inattention, max verbal and tactile cues to  turn head to the R      Exercises      General Comments General comments (skin integrity, edema, etc.): VSS however pt lethargic, opened eyes to name      Pertinent Vitals/Pain Pain Assessment: Faces Faces Pain Scale: No hurt    Home Living                      Prior Function            PT Goals (current goals can now be found in the care plan section) Progress towards PT goals: Not progressing toward goals - comment(pt with increased bleed size)    Frequency    Min 3X/week      PT Plan Frequency needs to be updated    Co-evaluation              AM-PAC PT "6 Clicks" Mobility   Outcome Measure  Help needed turning from your back to your side while in a flat bed without using bedrails?: Total Help needed moving from lying on your back to sitting on the side of a flat bed without using bedrails?: Total Help needed moving to and from a bed to a chair (including a wheelchair)?: Total Help needed standing up from a chair using your arms (e.g., wheelchair or bedside chair)?: Total Help needed to walk in hospital room?: Total Help needed climbing 3-5 steps with a railing? : Total 6 Click Score: 6    End of Session   Activity Tolerance: Patient limited by lethargy Patient left: in bed;with call bell/phone within reach;with bed alarm set;with family/visitor present Nurse Communication: Mobility status PT Visit Diagnosis: Muscle weakness (generalized) (M62.81);Other symptoms and signs involving the nervous system (R29.898);Hemiplegia and hemiparesis Hemiplegia - Right/Left: Right Hemiplegia - dominant/non-dominant: Dominant Hemiplegia - caused by: Cerebral infarction     Time: 1610-9604 PT Time Calculation (min) (ACUTE ONLY): 26 min  Charges:  $Therapeutic Activity: 8-22 mins $Neuromuscular Re-education: 8-22 mins                     Lewis Shock, PT, DPT Acute Rehabilitation Services Pager #: [phone] Office #: [phone]    Iona Hansen 04/06/2020, 11:44 AM

## 2020-04-06 NOTE — Progress Notes (Signed)
STROKE TEAM PROGRESS NOTE   INTERVAL HISTORY Daughter and wife at bedside.  Patient neurological stable, no significant change.  Repeat CT showed slight decrease of midline shift.  Sodium 144.  Will taper off 3% saline from tomorrow.  Vitals:   04/06/20 0800 04/06/20 0900 04/06/20 1000 04/06/20 1100  BP: (!) 152/95  (!) 148/67 (!) 152/69  Pulse: 75 90 74 76  Resp: 10 20 20  (!) 22  Temp: 98.2 F (36.8 C)     TempSrc: Oral     SpO2: 97% 98% 99% 98%  Weight:      Height:        CBC:  Recent Labs  Lab 04/02/20 0309 04/02/20 0309 04/03/20 0319 04/04/20 0406 04/05/20 0217 04/06/20 0440  WBC 10.0   < > 12.3*   < > 10.2 10.5  NEUTROABS 7.6  --  10.1*  --   --   --   HGB 12.1*   < > 12.2*   < > 11.9* 12.1*  HCT 35.7*   < > 35.1*   < > 35.0* 35.6*  MCV 92.0   < > 90.7   < > 90.7 91.3  PLT 139*   < > 153   < > 196 257   < > = values in this interval not displayed.    Basic Metabolic Panel:  Recent Labs  Lab 03/31/20 0355 03/31/20 0355 04/01/20 0440 04/01/20 1920 04/02/20 0309 04/02/20 0309 04/03/20 0319 04/04/20 0406 04/05/20 0217 04/05/20 0753 04/06/20 0440 04/06/20 1031  NA 159*   < > 153*   < > 147*   < > 143   < > 143  144   < > 144 145  K 3.4*   < > 3.0*   < > 3.5   < > 3.2*   < > 3.6  --  3.5  --   CL 130*   < > 122*   < > 116*   < > 109   < > 113*  --  112*  --   CO2 23   < > 20*   < > 25   < > 25   < > 22  --  25  --   GLUCOSE 222*   < > 152*   < > 190*   < > 212*   < > 154*  --  125*  --   BUN 37*   < > 27*   < > 16   < > 16   < > 17  --  16  --   CREATININE 1.44*   < > 0.99   < > 0.75   < > 0.83   < > 0.82  --  0.68  --   CALCIUM 8.1*   < > 8.5*   < > 8.7*   < > 9.0   < > 8.9  --  9.0  --   MG 2.1  --  2.3  --   --   --   --   --   --   --   --   --   PHOS 2.2*   < > 2.3*  --  1.9*  --  2.4*  --   --   --   --   --    < > = values in this interval not displayed.   IMAGING past 24 hours CT HEAD WO CONTRAST  Result Date: 04/06/2020 CLINICAL DATA:  Stroke  follow-up EXAM: CT HEAD  WITHOUT CONTRAST TECHNIQUE: Contiguous axial images were obtained from the base of the skull through the vertex without intravenous contrast. COMPARISON:  04/04/2020 FINDINGS: Brain: Large left MCA territory infarct, unchanged in size. Areas of suspected petechial hemorrhage are unchanged. There remains 9 mm of rightward midline shift. No hydrocephalus. There is early downward transtentorial herniation of the left uncus, unchanged. Vascular: Stent in the left MCA, unchanged. Skull: Normal Sinuses/Orbits: Clear Other: None IMPRESSION: Unchanged size of large left MCA territory infarct with unchanged 9 mm of rightward midline shift and early/impending downward transtentorial herniation of the left uncus. Electronically Signed   By: Deatra Robinson M.D.   On: 04/06/2020 03:48    PHYSICAL EXAM  Temp:  [98.1 F (36.7 C)-99.5 F (37.5 C)] 98.2 F (36.8 C) (05/03 0800) Pulse Rate:  [62-90] 76 (05/03 1100) Resp:  [8-25] 22 (05/03 1100) BP: (134-172)/(67-117) 152/69 (05/03 1100) SpO2:  [93 %-100 %] 98 % (05/03 1100) Weight:  [76.3 kg] 76.3 kg (05/03 0500)  General - Well nourished, well developed, lethargic.  Ophthalmologic - fundi not visualized due to noncooperation.  Cardiovascular - Regular rhythm and rate.  Neuro - lethargic with eyes open but global aphasia, did not follow commands.  Eyes left gaze deviation able to cross midline slightly, not blinking to visual threat on the right, blinking to threat on the left,  able to track on the left but not right. Mild lower right facial droop.  Tongue protrusion not cooperative. Spontaneous movement of LUE and LLE, LUE at least 4/5, no drift when lifted in air. LLE 3/5 with pain stimulation. RUE flaccid, right-sided neglect, RLE slight withdraw to pain. DTR 1+. Right upgoing toe, left downgoing toe. Sensation, coordination and gait not tested.   ASSESSMENT/PLAN [name] is a 59 y.o. male with history of HTN and DB  noncompliant w/ medications presenting to [facility] with R sided weakness and inability to talk. NIH 28. CTA showed L M1 occlusion. Transferred to [employer]. On arrival, received IV tPA 03/24/2020 at 0725 followed by mechanical thrombectomy w/ TICI2C revascularization and rescue stent placement.  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 Old L corona radiata and internal capsule infarcts. ASPECTS 10.     CTA head & neck large L MCA territory infarct. L M1 LVO. Collateral vessels in sylvian fissure. B ICA bifurcation atherosclerosis. Aortic atherosclerosis.   CT perfusion large L MCA penumbra.   Cerebral angio L MCA M1 occlusion w/ TICI2c revascularization x 6 passes. Rescue stenting for reocclusion   CT head 4/21 - evolving large left MCA infarct, trace midline shift, persistent high density and left C1 fissure, contrast versus blood  MRI  Large L MCA infarct evolution w/ edema and mass effect w/ 42mm L midline shift. SAH L sylvian fissure, L cerebral cortical sulci and basilar cisterns.  MRA  L M1 stent w/o flow c/w reocclusion. L ICA filling defect c/w small thrombus. VBJ w/ infundibulum vs aneurysm.  CT head 4/23 continued evolution large L MCA infarct w/ increased edema, now w/ 5-73mm midline shift. Stable L SAH w/ trace blood in tentorium.    CT head 4/24 - stable 5 mm shift. Large Lt MCA infarct. Subarachnoid blood and/or contrast in and around the left Sylvian fissure.  CT Head 03/29/2020 - Sequelae of large Left MCA territory infarct remain stable since 03/27/2020. Rightward midline shift remains 5 mm.  CT Head 04/04/2020 - Evolving large left MCA territory infarction with probable petechial hemorrhage. Progression of edema and mass effect with midline shift  now measuring 9 mm and probable early trapping of the right lateral ventricle.  CT Head 04/06/2020 - unchanged, still w/ 26mm midline shift    2D Echo EF 55-60%. No source of embolus   LE doppler neg DVT  May consider TEE and loop recorder if neuro improvement later.   TCD bubble study positive for clinically insignificant PFO     LDL 55.1  TG 798->675->269 (on 4/22)   HgbA1c 9.2  SCDs for VTE prophylaxis  No antithrombotic prior to admission, now on aspirin 81 mg daily and Brilinta (ticagrelor) 90 mg bid given stent placement and L tICA thrombus.   Therapy recommendations: CIR  Disposition:  pending   Cerebral edema Induced Hypernatremia  CT showed large left MCA infarct with minimal midline shift  MRI  Large L MCA infarct evolution w/ edema and mass effect w/ 3mm L midline shift.   CT head 4/23 continued evolution large L MCA infarct w/ increased edema, now w/ 5-38mm midline shift. Stable L SAH w/ trace blood in tentorium.   CT head 4/25 stable 6mm MLS  CT Head 04/04/2020 - Evolving large left MCA territory infarction with probable petechial hemorrhage. Progression of edema and mass effect with midline shift now measuring 9 mm and probable early trapping of the right lateral ventricle.  CT Head 04/06/2020 - unchanged, still w/ 81mm midline shift    3% saline @ 50 -> 75 cc / hr - taper off tomorrow  Goal Na 150-155  Na 141->143->145->144->145  Acute Respiratory Failure   Intubated for IR, left intubated post IR for airway protection secondary to acute L MCA stroke  Extubation 4/22  Tolerating well so far  Temp - afebrile ; WBCs - 10.5  Ancef 4/27>>5/2 for pneumonia  Hypertensive Emergency  SBP > 210 on arrival  Home meds:  Non-compliant - med rec lists:  losartan 25   Cleviprex changed to cardene d/t elevated TG -> now off  BP goal < 160 given SAH  resumed home dose of Cozaar 25 mg daily . Long-term BP goal normotensive  Hyperlipidemia  Home meds:   Non-compliant -  pravachol 10, crestor 5  LDL 55.1, goal < 70  TG 798->675-> 269 (on 4/22)  On lipitor 80  Continue statin at discharge  Diabetes type II Uncontrolled  Home meds:  Noncompliant - med rec lists:  dapagliflozin 10, glipizide 5, actos 30, dapagliflozin 10 / metformin 1000  HgbA1c 9.2, goal < 7.0  CBGs  Now on lantus 30->25 bid, novolog 6u->3u Q4h   SSI 0-15 q4   Dysphagia . Secondary to stroke . NPO . Speech on board . Put on TF @ 40 -> 70 -> 40 - insulin dose adjusted . Resume TF volume once 3% saline tapered off . Has Cortrak   Other Stroke Risk Factors  Hx ETOH use, alcohol level <10, will advise family that he should drink no more than 2 drink(s) a day  Substance abuse - UDS:  Benzos POSITIVE  Obesity, Body mass index is 27.99 kg/m., recommend weight loss, diet and exercise as appropriate   Other Active Problems  Code status - Full code  Thrombocytopenia PLT - resolved  Hypokalemia K 3.2  supplement ->3.6->3.5  Urinary retention -  I&O cath, foley prn  Hospital day # 13   This patient is critically ill due to large left MCA infarct s/p thrombectomy, cerebral edema, dysphagia and at significant risk of neurological worsening, death form recurrent stroke brain herniation, aspiration. This patient's care requires constant monitoring of vital signs, hemodynamics, respiratory and cardiac monitoring, review of multiple databases, neurological assessment, discussion with family, other specialists and medical decision making of high complexity. I spent 35 minutes of neurocritical care time in the care of this patient. I had long discussion with daughter and the wife at bedside, updated pt current condition, treatment plan and potential prognosis, and answered all the questions.  They expressed understanding and appreciation.    Marvel Plan, MD PhD Stroke Neurology 04/06/2020 11:54 AM    To contact Stroke Continuity provider, please refer to  [email or web address]. After hours, contact General Neurology

## 2020-04-07 LAB — BASIC METABOLIC PANEL
Anion gap: 8 (ref 5–15)
BUN: 17 mg/dL (ref 6–20)
CO2: 22 mmol/L (ref 22–32)
Calcium: 9 mg/dL (ref 8.9–10.3)
Chloride: 116 mmol/L — ABNORMAL HIGH (ref 98–111)
Creatinine, Ser: 0.67 mg/dL (ref 0.61–1.24)
GFR calc Af Amer: >60 mL/min (ref >60)
GFR calc non Af Amer: >60 mL/min (ref >60)
Glucose, Bld: 136 mg/dL — ABNORMAL HIGH (ref 70–99)
Potassium: 3 mmol/L — ABNORMAL LOW (ref 3.5–5.1)
Sodium: 146 mmol/L — ABNORMAL HIGH (ref 135–145)

## 2020-04-07 LAB — CBC
HCT: 36.1 % — ABNORMAL LOW (ref 39.0–52.0)
Hemoglobin: 12.4 g/dL — ABNORMAL LOW (ref 13.0–17.0)
MCH: 31.3 pg (ref 26.0–34.0)
MCHC: 34.3 g/dL (ref 30.0–36.0)
MCV: 91.2 fL (ref 80.0–100.0)
Platelets: 281 10*3/uL (ref 150–400)
RBC: 3.96 MIL/uL — ABNORMAL LOW (ref 4.22–5.81)
RDW: 12.3 % (ref 11.5–15.5)
WBC: 9.8 10*3/uL (ref 4.0–10.5)
nRBC: 0 % (ref 0.0–0.2)

## 2020-04-07 LAB — GLUCOSE, CAPILLARY
Glucose-Capillary: 127 mg/dL — ABNORMAL HIGH (ref 70–99)
Glucose-Capillary: 137 mg/dL — ABNORMAL HIGH (ref 70–99)
Glucose-Capillary: 128 mg/dL — ABNORMAL HIGH (ref 70–99)
Glucose-Capillary: 122 mg/dL — ABNORMAL HIGH (ref 70–99)
Glucose-Capillary: 173 mg/dL — ABNORMAL HIGH (ref 70–99)

## 2020-04-07 MED ORDER — SODIUM CHLORIDE 3 % IV SOLN
INTRAVENOUS | Status: DC
  Administered 2020-04-07: 40 mL/h via INTRAVENOUS
  Filled 2020-04-07 (×2): qty 500

## 2020-04-07 MED ORDER — POTASSIUM CHLORIDE 20 MEQ/15ML (10%) PO SOLN
40.0000 meq | ORAL | Status: AC
  Administered 2020-04-07 (×3): 40 meq
  Filled 2020-04-07 (×3): qty 30

## 2020-04-07 MED ORDER — LOSARTAN POTASSIUM 50 MG PO TABS
50.0000 mg | ORAL_TABLET | Freq: Two times a day (BID) | ORAL | Status: DC
  Administered 2020-04-07 – 2020-04-09 (×4): 50 mg
  Filled 2020-04-07 (×5): qty 1

## 2020-04-07 MED ORDER — VITAL AF 1.2 CAL PO LIQD
1000.0000 mL | ORAL | Status: DC
  Administered 2020-04-07 – 2020-04-08 (×2): 1000 mL
  Filled 2020-04-07: qty 1000

## 2020-04-07 MED ORDER — SODIUM CHLORIDE 3 % IV SOLN
INTRAVENOUS | Status: DC
  Administered 2020-04-07: 20 mL/h via INTRAVENOUS
  Filled 2020-04-07: qty 500

## 2020-04-07 NOTE — Progress Notes (Signed)
STROKE TEAM PROGRESS NOTE   INTERVAL HISTORY Daughter and wife at bedside.  Patient neurological stable, no significant change.  Repeat CT showed slight decrease of midline shift.  Sodium 146.  Will taper off 3% saline.  Vitals:   04/07/20 0800 04/07/20 0900 04/07/20 0950 04/07/20 1011  BP: (!) 137/91 (!) 169/79 (!) 159/91 (!) 171/79  Pulse: 76 73 70   Resp: 20 11 (!) 0   Temp: 97.6 F (36.4 C)     TempSrc: Axillary     SpO2: 99% 98% 99%   Weight:      Height:        CBC:  Recent Labs  Lab 04/02/20 0309 04/02/20 0309 04/03/20 0319 04/04/20 0406 04/06/20 0440 04/07/20 0459  WBC 10.0   < > 12.3*   < > 10.5 9.8  NEUTROABS 7.6  --  10.1*  --   --   --   HGB 12.1*   < > 12.2*   < > 12.1* 12.4*  HCT 35.7*   < > 35.1*   < > 35.6* 36.1*  MCV 92.0   < > 90.7   < > 91.3 91.2  PLT 139*   < > 153   < > 257 281   < > = values in this interval not displayed.    Basic Metabolic Panel:  Recent Labs  Lab 04/01/20 0440 04/01/20 1920 04/02/20 0309 04/02/20 0309 04/03/20 0319 04/04/20 0406 04/06/20 0440 04/06/20 1031 04/06/20 2146 04/07/20 0459  NA 153*   < > 147*   < > 143   < > 144   < > 148* 146*  K 3.0*   < > 3.5   < > 3.2*   < > 3.5  --   --  3.0*  CL 122*   < > 116*   < > 109   < > 112*  --   --  116*  CO2 20*   < > 25   < > 25   < > 25  --   --  22  GLUCOSE 152*   < > 190*   < > 212*   < > 125*  --   --  136*  BUN 27*   < > 16   < > 16   < > 16  --   --  17  CREATININE 0.99   < > 0.75   < > 0.83   < > 0.68  --   --  0.67  CALCIUM 8.5*   < > 8.7*   < > 9.0   < > 9.0  --   --  9.0  MG 2.3  --   --   --   --   --   --   --   --   --   PHOS 2.3*  --  1.9*  --  2.4*  --   --   --   --   --    < > = values in this interval not displayed.   IMAGING past 24 hours No results found.  PHYSICAL EXAM  Temp:  [97.6 F (36.4 C)-98.8 F (37.1 C)] 97.6 F (36.4 C) (05/04 0800) Pulse Rate:  [63-85] 70 (05/04 0950) Resp:  [0-24] 0 (05/04 0950) BP: (112-175)/(67-112) 171/79  (05/04 1011) SpO2:  [94 %-100 %] 99 % (05/04 0950) Weight:  [77.1 kg] 77.1 kg (05/04 0500)  General - Well nourished, well developed, lethargic.  Ophthalmologic - fundi not visualized due  to noncooperation.  Cardiovascular - Regular rhythm and rate.  Neuro - lethargic with eyes open but global aphasia, did not follow commands.  Eyes left gaze deviation able to cross midline slightly, not blinking to visual threat on the right, blinking to threat on the left,  able to track on the left but not right. Mild lower right facial droop.  Tongue protrusion not cooperative. Spontaneous movement of LUE and LLE, LUE at least 4/5, no drift when lifted in air. LLE 3/5 with pain stimulation. RUE flaccid, right-sided neglect, RLE slight withdraw to pain. DTR 1+. Right upgoing toe, left downgoing toe. Sensation, coordination and gait not tested.   ASSESSMENT/PLAN [name] is a 59 y.o. male with history of HTN and DB noncompliant w/ medications presenting to [facility] with R sided weakness and inability to talk. NIH 28. CTA showed L M1 occlusion. Transferred to [employer]. [facility]. On arrival, received IV tPA 03/24/2020 at 0725 followed by mechanical thrombectomy w/ TICI2C revascularization and rescue stent placement.  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 Old L corona radiata and internal capsule infarcts. ASPECTS 10.     CTA head & neck large L MCA territory infarct. L M1 LVO. Collateral vessels in sylvian fissure. B ICA bifurcation atherosclerosis. Aortic atherosclerosis.   CT perfusion large L MCA penumbra.   Cerebral angio L MCA M1 occlusion w/ TICI2c revascularization x 6 passes. Rescue stenting for reocclusion   CT head 4/21 - evolving large left MCA infarct,  trace midline shift, persistent high density and left C1 fissure, contrast versus blood  MRI  Large L MCA infarct evolution w/ edema and mass effect w/ 67mm L midline shift. SAH L sylvian fissure, L cerebral cortical sulci and basilar cisterns.  MRA  L M1 stent w/o flow c/w reocclusion. L ICA filling defect c/w small thrombus. VBJ w/ infundibulum vs aneurysm.  CT head 4/23 continued evolution large L MCA infarct w/ increased edema, now w/ 5-66mm midline shift. Stable L SAH w/ trace blood in tentorium.    CT head 4/24 - stable 5 mm shift. Large Lt MCA infarct. Subarachnoid blood and/or contrast in and around the left Sylvian fissure.   CT Head 03/29/2020 - Sequelae of large Left MCA territory infarct remain stable since 03/27/2020. Rightward midline shift remains 5 mm.  CT Head 04/04/2020 - Evolving large left MCA territory infarction with probable petechial hemorrhage. Progression of edema and mass effect with midline shift now measuring 9 mm and probable early trapping of the right lateral ventricle.  CT Head 04/06/2020 - unchanged, still w/ 36mm midline shift    2D Echo EF 55-60%. No source of embolus   LE doppler neg DVT  May consider TEE and loop recorder if further neuro improvement   TCD bubble study positive for clinically insignificant PFO     LDL 55.1  TG 798->675->269 (on 4/22)   HgbA1c 9.2  SCDs for VTE prophylaxis  No antithrombotic prior to admission, now on aspirin 81 mg daily and Brilinta (ticagrelor) 90 mg bid given stent placement and L tICA thrombus.   Therapy recommendations: CIR  Disposition:  pending   Cerebral edema Induced Hypernatremia  CT showed large left MCA infarct with minimal midline shift  MRI  Large L  MCA infarct evolution w/ edema and mass effect w/ 48mm L midline shift.   CT head 4/23 continued evolution large L MCA infarct w/ increased edema, now w/ 5-38mm midline shift. Stable L SAH w/ trace blood in tentorium.   CT head 4/25 stable 52mm  MLS  CT Head 04/04/2020 - Evolving large left MCA territory infarction with probable petechial hemorrhage. Progression of edema and mass effect with midline shift now measuring 9 mm and probable early trapping of the right lateral ventricle.  CT Head 04/06/2020 - unchanged, still w/ 44mm midline shift    CT repeat in am  3% saline @ 50 -> 75 cc / hr - taper   Goal Na 150-155  Na 141->143->145->144->145->146  Acute Respiratory Failure   Intubated for IR, left intubated post IR for airway protection secondary to acute L MCA stroke  Extubation 4/22  Tolerating well so far  Temp - afebrile ; WBCs - 10.5  Ancef 4/27>>5/2 for pneumonia  Hypertensive Emergency  SBP > 210 on arrival  Home meds:  Non-compliant - med rec lists:  losartan 25   Cleviprex changed to cardene d/t elevated TG -> now off  BP goal < 160 given SAH  Cozaar 25 mg daily -> 50mg  bid . Hydralazine PRN . Long-term BP goal normotensive  Hyperlipidemia  Home meds:  Non-compliant -  pravachol 10, crestor 5  LDL 55.1, goal < 70  TG 798->675-> 269 (on 4/22)  On lipitor 80  Continue statin at discharge  Diabetes type II Uncontrolled  Home meds:  Noncompliant - med rec lists:  dapagliflozin 10, glipizide 5, actos 30, dapagliflozin 10 / metformin 1000  HgbA1c 9.2, goal < 7.0  CBGs  lantus 30->25 bid, novolog 6u->3u Q4h   SSI 0-15 q4   Dysphagia . Secondary to stroke . NPO . Speech on board . Put on TF @ 40 -> 70 -> 40 ->70 . Has Cortrak   Other Stroke Risk Factors  Hx ETOH use, alcohol level <10, will advise family that he should drink no more than 2 drink(s) a day  Substance abuse - UDS:  Benzos POSITIVE  Obesity, Body mass index is 28.29 kg/m., recommend weight loss, diet and exercise as appropriate   Other Active Problems  Code status - Full code  Thrombocytopenia PLT - resolved  Hypokalemia K 3.2  supplement ->3.6->3.5->3.0 supplement  Urinary retention - I&O cath, foley  prn  Hospital day # 14   This patient is critically ill due to large left MCA infarct s/p thrombectomy, cerebral edema, dysphagia and at significant risk of neurological worsening, death form recurrent stroke brain herniation, aspiration. This patient's care requires constant monitoring of vital signs, hemodynamics, respiratory and cardiac monitoring, review of multiple databases, neurological assessment, discussion with family, other specialists and medical decision making of high complexity. I spent 35 minutes of neurocritical care time in the care of this patient. I had long discussion with daughter and the wife at bedside, updated pt current condition, treatment plan and potential prognosis, and answered all the questions.  They expressed understanding and appreciation.    5/22, MD PhD Stroke Neurology 04/07/2020 11:01 AM    To contact Stroke Continuity provider, please refer to 06/07/2020. After hours, contact General Neurology

## 2020-04-07 NOTE — Progress Notes (Signed)
Nutrition Follow-up  DOCUMENTATION CODES:   Not applicable  INTERVENTION:   Vital AF 1.2 @ 70 ml/hr (1680 ml/hr) via Cortrak tube  Provides: 2016 kcal, 126 grams protein, and 1360 ml free water.    NUTRITION DIAGNOSIS:   Inadequate oral intake related to inability to eat as evidenced by NPO status. Ongoing.   GOAL:   Patient will meet greater than or equal to 90% of their needs Meeting with TF  MONITOR:   TF tolerance, Labs  REASON FOR ASSESSMENT:   Consult Enteral/tube feeding initiation and management  ASSESSMENT:   Pt with PMH of DM, HTN which he does not take medication for admitted with L MCA s/p IR for thrombectomy and stent on 4/20.    4/22 NG placed and started on TF 4/23 Cortrak placed; tip gastric  4/25 intubated  4/28 extubated 4/29 not ready for SLP eval; cortrak remains gastric 5/1 tx back to ICU for 3% hypertonic saline  5/4 not ready for swallow study  Medications reviewed and include: questran, SSI, 3 units novolog every 4 hours, 25 units lantus BID 40 mmol Kphos every 4 hours  Hypertonic saline 3% Labs reviewed: Na 146 (H), K+ 3.0 (L) CBG's: 127-137-128  Diet Order:   Diet Order    None      EDUCATION NEEDS:   No education needs have been identified at this time  Skin:  Skin Assessment: Reviewed RN Assessment  Last BM:  5/2 rectal tube removed; 5/4 smear on questran  Height:   Ht Readings from Last 1 Encounters:  03/30/20 5\' 5"  (1.651 m)    Weight:   Wt Readings from Last 1 Encounters:  04/07/20 77.1 kg    Ideal Body Weight:  61.8 kg  BMI:  Body mass index is 28.29 kg/m.  Estimated Nutritional Needs:   Kcal:  2000-2200  Protein:  105-125 grams  Fluid:  > 2 L/day  06/07/20., RD, LDN, CNSC See AMiON for contact information

## 2020-04-07 NOTE — Progress Notes (Signed)
Inpatient Rehab Admissions Coordinator:   Met with pt at bedside.  He is alert, moving 3/4 extremities (BLE and LUE).  Will continue to follow for timing of possible CIR admit pending medical stability, insurance auth, and bed availability.   Shann Medal, PT, DPT Admissions Coordinator [phone] 04/07/20  12:08 PM

## 2020-04-08 ENCOUNTER — Inpatient Hospital Stay (HOSPITAL_COMMUNITY): Payer: BC Managed Care – PPO

## 2020-04-08 LAB — BASIC METABOLIC PANEL
Anion gap: 6 (ref 5–15)
BUN: 21 mg/dL — ABNORMAL HIGH (ref 6–20)
CO2: 23 mmol/L (ref 22–32)
Calcium: 9.2 mg/dL (ref 8.9–10.3)
Chloride: 115 mmol/L — ABNORMAL HIGH (ref 98–111)
Creatinine, Ser: 0.71 mg/dL (ref 0.61–1.24)
GFR calc Af Amer: >60 mL/min (ref >60)
GFR calc non Af Amer: >60 mL/min (ref >60)
Glucose, Bld: 181 mg/dL — ABNORMAL HIGH (ref 70–99)
Potassium: 3.5 mmol/L (ref 3.5–5.1)
Sodium: 144 mmol/L (ref 135–145)

## 2020-04-08 LAB — GLUCOSE, CAPILLARY
Glucose-Capillary: 121 mg/dL — ABNORMAL HIGH (ref 70–99)
Glucose-Capillary: 142 mg/dL — ABNORMAL HIGH (ref 70–99)
Glucose-Capillary: 145 mg/dL — ABNORMAL HIGH (ref 70–99)
Glucose-Capillary: 160 mg/dL — ABNORMAL HIGH (ref 70–99)
Glucose-Capillary: 165 mg/dL — ABNORMAL HIGH (ref 70–99)
Glucose-Capillary: 178 mg/dL — ABNORMAL HIGH (ref 70–99)
Glucose-Capillary: 179 mg/dL — ABNORMAL HIGH (ref 70–99)

## 2020-04-08 LAB — CBC
HCT: 37 % — ABNORMAL LOW (ref 39.0–52.0)
Hemoglobin: 12.5 g/dL — ABNORMAL LOW (ref 13.0–17.0)
MCH: 31 pg (ref 26.0–34.0)
MCHC: 33.8 g/dL (ref 30.0–36.0)
MCV: 91.8 fL (ref 80.0–100.0)
Platelets: 300 10*3/uL (ref 150–400)
RBC: 4.03 MIL/uL — ABNORMAL LOW (ref 4.22–5.81)
RDW: 12.7 % (ref 11.5–15.5)
WBC: 9.3 10*3/uL (ref 4.0–10.5)
nRBC: 0 % (ref 0.0–0.2)

## 2020-04-08 MED ORDER — VITAL AF 1.2 CAL PO LIQD
1000.0000 mL | ORAL | Status: DC
  Administered 2020-04-08 – 2020-04-09 (×2): 1000 mL
  Filled 2020-04-08 (×2): qty 1000

## 2020-04-08 MED ORDER — ACETAMINOPHEN 325 MG PO TABS
650.0000 mg | ORAL_TABLET | Freq: Four times a day (QID) | ORAL | Status: DC | PRN
  Administered 2020-04-14 – 2020-04-15 (×2): 650 mg
  Filled 2020-04-08 (×2): qty 2

## 2020-04-08 MED ORDER — VITAL AF 1.2 CAL PO LIQD: 1000.0000 mL | ORAL | Status: DC

## 2020-04-08 MED ORDER — ACETAMINOPHEN 325 MG PO TABS: 650.0000 mg | ORAL_TABLET | Freq: Four times a day (QID) | ORAL | Status: DC | PRN

## 2020-04-08 MED ORDER — POTASSIUM CHLORIDE 20 MEQ/15ML (10%) PO SOLN
30.0000 meq | ORAL | Status: AC
  Administered 2020-04-08 (×2): 30 meq via ORAL
  Filled 2020-04-08 (×2): qty 30

## 2020-04-08 MED ORDER — ACETAMINOPHEN 160 MG/5ML PO SOLN
650.0000 mg | Freq: Four times a day (QID) | ORAL | Status: DC | PRN
  Administered 2020-04-10: 650 mg
  Filled 2020-04-08: qty 20.3

## 2020-04-08 MED ORDER — RESOURCE THICKENUP CLEAR PO POWD
ORAL | Status: DC | PRN
  Filled 2020-04-08: qty 125

## 2020-04-08 MED ORDER — ACETAMINOPHEN 160 MG/5ML PO SOLN: 650.0000 mg | Freq: Four times a day (QID) | ORAL | Status: DC | PRN

## 2020-04-08 NOTE — Progress Notes (Signed)
STROKE TEAM PROGRESS NOTE   INTERVAL HISTORY Daughter and the wife at bedside.  Patient neurological stable, no acute event overnight.  3% saline has been tapered off.  CT head this morning no acute changes.  Pending CIR.  Vitals:   04/08/20 0500 04/08/20 0600 04/08/20 0700 04/08/20 0800  BP: (!) 142/80 138/67 (!) 143/83 131/69  Pulse: 84 77 80 82  Resp: 19 14 19 19   Temp:    97.9 F (36.6 C)  TempSrc:    Axillary  SpO2: 100% 95% 100% 97%  Weight: 77 kg     Height:        CBC:  Recent Labs  Lab 04/02/20 0309 04/02/20 0309 04/03/20 0319 04/04/20 0406 04/07/20 0459 04/08/20 0726  WBC 10.0   < > 12.3*   < > 9.8 9.3  NEUTROABS 7.6  --  10.1*  --   --   --   HGB 12.1*   < > 12.2*   < > 12.4* 12.5*  HCT 35.7*   < > 35.1*   < > 36.1* 37.0*  MCV 92.0   < > 90.7   < > 91.2 91.8  PLT 139*   < > 153   < > 281 300   < > = values in this interval not displayed.    Basic Metabolic Panel:  Recent Labs  Lab 04/02/20 0309 04/02/20 0309 04/03/20 0319 04/04/20 0406 04/07/20 0459 04/08/20 0726  NA 147*   < > 143   < > 146* 144  K 3.5   < > 3.2*   < > 3.0* 3.5  CL 116*   < > 109   < > 116* 115*  CO2 25   < > 25   < > 22 23  GLUCOSE 190*   < > 212*   < > 136* 181*  BUN 16   < > 16   < > 17 21*  CREATININE 0.75   < > 0.83   < > 0.67 0.71  CALCIUM 8.7*   < > 9.0   < > 9.0 9.2  PHOS 1.9*  --  2.4*  --   --   --    < > = values in this interval not displayed.   IMAGING past 24 hours CT HEAD WO CONTRAST  Result Date: 04/08/2020 CLINICAL DATA:  Stroke follow-up EXAM: CT HEAD WITHOUT CONTRAST TECHNIQUE: Contiguous axial images were obtained from the base of the skull through the vertex without intravenous contrast. COMPARISON:  04/07/2019 FINDINGS: Brain: Evolving large left MCA territory infarction again identified. Areas of gyriform hyperdensity again noted likely reflecting petechial hemorrhage. No new hemorrhage. Edema and mass effect remain similar with rightward midline shift again  measuring 9 mm. No progression of mild medialization of the left uncus. Ventricles remain stable in size. No new loss of gray-white differentiation. Vascular: No new finding. Stent is again noted along the left M1 MCA. Skull: Calvarium is unremarkable. Sinuses/Orbits: No acute finding. Other: None. IMPRESSION: Evolving large left MCA territory infarction with similar mass effect. No new hemorrhage or hydrocephalus. Electronically Signed   By: 06/07/2019 M.D.   On: 04/08/2020 08:11    PHYSICAL EXAM  Temp:  [97.5 F (36.4 C)-99.1 F (37.3 C)] 97.9 F (36.6 C) (05/05 0800) Pulse Rate:  [70-97] 82 (05/05 0800) Resp:  [0-25] 19 (05/05 0800) BP: (117-171)/(63-92) 131/69 (05/05 0800) SpO2:  [94 %-100 %] 97 % (05/05 0800) Weight:  [77 kg] 77 kg (05/05 0500)  General -  Well nourished, well developed, lethargic.  Ophthalmologic - fundi not visualized due to noncooperation.  Cardiovascular - Regular rhythm and rate.  Neuro - lethargic with eyes open but global aphasia, did not follow commands.  Eyes left gaze deviation able to cross midline slightly, not blinking to visual threat on the right, blinking to threat on the left,  able to track on the left but not right. Mild lower right facial droop.  Tongue protrusion not cooperative. Spontaneous movement of LUE and LLE, LUE at least 4/5, no drift when lifted in air. LLE 3/5 with pain stimulation. RUE flaccid, right-sided neglect, RLE slight withdraw to pain. DTR 1+. Right upgoing toe, left downgoing toe. Sensation, coordination and gait not tested.   ASSESSMENT/PLAN [name] is a 59 y.o. male with history of HTN and DB noncompliant w/ medications presenting to [facility] with R sided weakness and inability to talk. NIH 28. CTA showed L M1 occlusion. Transferred to [facility]. On arrival, received IV tPA 03/24/2020 at 0725 followed by mechanical thrombectomy w/ TICI2C revascularization and rescue stent  placement. Admitted to the ICU. transititioned out with transfer back to neuro ICU 5/1 with worsening edema for hypertonic saline protocol.    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 Old L corona radiata and internal capsule infarcts. ASPECTS 10.     CTA head & neck large L MCA territory infarct. L M1 LVO. Collateral vessels in sylvian fissure. B ICA bifurcation atherosclerosis. Aortic atherosclerosis.   CT perfusion large L MCA penumbra.   Cerebral angio L MCA M1 occlusion w/ TICI2c revascularization x 6 passes. Rescue stenting for reocclusion   CT head 4/21 - evolving large left MCA infarct, trace midline shift, persistent high density and left C1 fissure, contrast versus blood  MRI  Large L MCA infarct evolution w/ edema and mass effect w/ 21mm L midline shift. SAH L sylvian fissure, L cerebral cortical sulci and basilar cisterns.  MRA  L M1 stent w/o flow c/w reocclusion. L ICA filling defect c/w small thrombus. VBJ w/ infundibulum vs aneurysm.  CT head 4/23 continued evolution large L MCA infarct w/ increased edema, now w/ 5-4mm midline shift. Stable L SAH w/ trace blood in tentorium.    CT head 4/24 - stable 5 mm shift. Large Lt MCA infarct. Subarachnoid blood and/or contrast in and around the left Sylvian fissure.   CT Head 03/29/2020 - Sequelae of large Left MCA territory infarct remain stable since 03/27/2020. Rightward midline shift remains 5 mm.  CT Head 04/04/2020 - Evolving large left MCA territory infarction with probable petechial hemorrhage. Progression of edema and mass effect with midline shift now measuring 9 mm and probable early trapping of the right lateral ventricle.  CT Head 04/06/2020 - unchanged, still w/ 23mm midline shift    CT Head 04/08/2020 - evolving L MCA infarct w/  similar mass effect   2D Echo EF 55-60%. No source of embolus   LE doppler neg DVT  May consider TEE and loop recorder at outpt with neuro follow up if further neuro improvement   TCD bubble study positive for clinically insignificant PFO     LDL 55.1  TG 798->675->269 (on 4/22)   HgbA1c 9.2  SCDs for VTE prophylaxis  No antithrombotic prior to admission, now on aspirin 81 mg daily and Brilinta (ticagrelor) 90 mg bid given stent placement and L tICA thrombus.   Therapy recommendations: CIR  Disposition:  pending   Cerebral edema Induced Hypernatremia  CT showed large left MCA infarct with minimal midline shift  MRI  Large L MCA infarct evolution w/ edema and mass effect w/ 38mm L midline shift.   CT head 4/23 continued evolution large L MCA infarct w/ increased edema, now w/ 5-44mm midline shift. Stable L SAH w/ trace blood in tentorium.   CT head 4/25 stable 25mm MLS  CT Head 04/04/2020 - Evolving large left MCA territory infarction with probable petechial hemorrhage. Progression of edema and mass effect with midline shift now measuring 9 mm and probable early trapping of the right lateral ventricle.  CT Head 04/06/2020 - unchanged, still w/ 61mm midline shift    CT Head 04/08/2020 - evolving L MCA infarct w/ similar mass effect  3% saline off  Na 148->146->144  Acute Respiratory Failure   Intubated for IR, left intubated post IR for airway protection secondary to acute L MCA stroke  Extubation 4/22  Tolerating well so far  Temp - afebrile ; WBCs - 9.3  Ancef 4/27>>5/2 for pneumonia  Hypertensive Emergency  SBP > 210 on arrival  Home meds:  Non-compliant - med rec lists:  losartan 25   Cleviprex changed to cardene d/t elevated TG -> now off  BP goal < 160 given SAH  Cozaar 25 mg daily -> 50mg  bid . Hydralazine PRN . Long-term BP goal normotensive  Hyperlipidemia  Home meds:  Non-compliant -  pravachol 10, crestor 5  LDL 55.1, goal < 70  TG  798->675-> 269 (on 4/22)  On lipitor 80  Continue statin at discharge  Diabetes type II Uncontrolled  Home meds:  Noncompliant - med rec lists:  dapagliflozin 10, glipizide 5, actos 30, dapagliflozin 10 / metformin 1000  HgbA1c 9.2, goal < 7.0  CBGs  lantus 30->25 bid, novolog 6u->3u Q4h   SSI    Dysphagia . Secondary to stroke . Now on dys 1 and honey thick liquid . Put on TF @ 40->70->40->70->50 . Has Cortrak . Speech on board . Dietitian consult for further recommendation . Encourage po intake   Other Stroke Risk Factors  Hx ETOH use, alcohol level <10, will advise family that he should drink no more than 2 drink(s) a day  Substance abuse - UDS:  Benzos POSITIVE  Overweight, Body mass index is 28.25 kg/m., recommend weight loss, diet and exercise as appropriate   Other Active Problems  Code status - Full code  Thrombocytopenia PLT - resolved  Hypokalemia K 3.2  supplement ->3.6->3.5->3.0->3.5 supplement  Urinary retention - I&O cath, foley prn  D/c [facility] day # 15   This patient is critically ill due to large left MCA infarct s/p thrombectomy, cerebral edema, dysphagia and at significant risk of neurological worsening, death form recurrent stroke brain herniation, aspiration. This patient's care requires constant monitoring of vital signs, hemodynamics, respiratory and cardiac monitoring, review of multiple databases, neurological assessment, discussion with family, other specialists and medical decision making of high complexity. I spent 30 minutes of neurocritical care time in the care of this patient. I had long discussion with daughter and the wife at bedside, updated pt current condition, treatment plan and potential prognosis, and answered all the questions.  They expressed understanding and appreciation.    [facility], MD PhD Stroke Neurology  04/08/2020 9:28 AM    To contact Stroke Continuity provider, please refer to [email or web address] After  hours, contact General Neurology

## 2020-04-08 NOTE — Progress Notes (Signed)
Physical Therapy Treatment Patient Details Name: Brad Delacruz MRN: [id number] DOB: 01/01/1961 Today's Date: 04/08/2020    History of Present Illness 59 y.o. male past medical history of diabetes, hypertension which he does not take medication for, presented to the emergency room at [facility] with last known well of 5 AM when he was at work and was noted to have a sudden onset of right-sided weakness and inability to talk. Pt given TPA and s/p thrombectomy on 4/20.  Was intubated and restrained as of 4/22,  extubated 4/28 and now drowsy.  L MCA revascularization with tPA, acute respiratory failure from possible Asp PNA, now referred to rehab.  PMHx:  DM, HTN,  On 5/2 pt with worsening of bleed to 64m, transferred to ICU and placed on 3% saline.    PT Comments    Pt alert and with improved activity tolerance during session. PT remains inconsistent with command following however demonstrates improved following with commands associated with tasks like applying lotion or rolling to clean. Pt also with improved following with some demonstration and tactile cueing to initiate. Pt continues to requires maxA to mobilize in bed and totalA to stand due to R hemiplegia. Pt also demonstrates pushing to R side during session. Pt will continue to benefit from aggressive mobilization and PT POC to improve balance, mobility, and to reduce caregiver burden. PT continues to recommend CIR at this time.   Follow Up Recommendations  CIR     Equipment Recommendations  Wheelchair (measurements PT);Wheelchair cushion (measurements PT);Hospital bed;Other (comment)    Recommendations for Other Services       Precautions / Restrictions Precautions Precautions: Fall Precaution Comments: cortrak Restrictions Weight Bearing Restrictions: No    Mobility  Bed Mobility Overal bed mobility: Needs Assistance Bed Mobility: Rolling;Supine to Sit;Sit to Supine Rolling: Max assist   Supine to sit: Max  assist;+2 for physical assistance Sit to supine: Max assist;+2 for physical assistance      Transfers Overall transfer level: Needs assistance Equipment used: 2 person hand held assist Transfers: Sit to/from Stand Sit to Stand: Total assist;+2 physical assistance         General transfer comment: pt performs 2 separate stands with bilateral knee block, BUE support, and PT/OT facilitation of hip extension bilaterally, knee buckling of R knee noted.  Ambulation/Gait                 Stairs             Wheelchair Mobility    Modified Rankin (Stroke Patients Only) Modified Rankin (Stroke Patients Only) Pre-Morbid Rankin Score: No symptoms Modified Rankin: Severe disability     Balance Overall balance assessment: Needs assistance Sitting-balance support: No upper extremity supported;Feet supported Sitting balance-Leahy Scale: Zero Sitting balance - Comments: maxA, pt pushing toward R side with WB through LUE Postural control: Right lateral lean Standing balance support: Bilateral upper extremity supported Standing balance-Leahy Scale: Zero Standing balance comment: totalA x2 to maintain static standing balance                            Cognition Arousal/Alertness: Awake/alert Behavior During Therapy: Flat affect Overall Cognitive Status: Impaired/Different from baseline Area of Impairment: Problem solving;Awareness;Following commands;Attention;Orientation                   Current Attention Level: Focused Memory: Decreased recall of precautions;Decreased short-term memory Following Commands: Follows one step commands inconsistently Safety/Judgement: Decreased awareness of safety  Awareness: Intellectual Problem Solving: Slow processing;Decreased initiation;Difficulty sequencing;Requires tactile cues;Requires verbal cues General Comments: pt inconsistent with command following, remain unsure if pt responds better to english or spanish at  this time. Pt follow demonstration and performs motor commands better with initiation. Pt seems to follow task based commands better as well (I.E. rolling to clean, applying lotion to LEs)      Exercises      General Comments General comments (skin integrity, edema, etc.): pt grimacing with standing until condom catheter explodes on floor releasing backed up urine. Pt appears more comfortable after this.      Pertinent Vitals/Pain Pain Assessment: Faces Faces Pain Scale: Hurts even more Pain Location: grimacing prior to condom cath bursting Pain Descriptors / Indicators: Grimacing Pain Intervention(s): Monitored during session    Home Living                      Prior Function            PT Goals (current goals can now be found in the care plan section) Acute Rehab PT Goals Patient Stated Goal: Per family, return home safely Progress towards PT goals: Progressing toward goals    Frequency    Min 4X/week      PT Plan Current plan remains appropriate    Co-evaluation PT/OT/SLP Co-Evaluation/Treatment: Yes Reason for Co-Treatment: Complexity of the patient's impairments (multi-system involvement);Necessary to address cognition/behavior during functional activity;For patient/therapist safety;To address functional/ADL transfers PT goals addressed during session: Mobility/safety with mobility;Balance;Strengthening/ROM        AM-PAC PT "6 Clicks" Mobility   Outcome Measure  Help needed turning from your back to your side while in a flat bed without using bedrails?: Total Help needed moving from lying on your back to sitting on the side of a flat bed without using bedrails?: Total Help needed moving to and from a bed to a chair (including a wheelchair)?: Total Help needed standing up from a chair using your arms (e.g., wheelchair or bedside chair)?: Total Help needed to walk in hospital room?: Total Help needed climbing 3-5 steps with a railing? : Total 6 Click  Score: 6    End of Session Equipment Utilized During Treatment: Gait belt Activity Tolerance: Patient tolerated treatment well Patient left: in bed;with call bell/phone within reach;with bed alarm set;with family/visitor present Nurse Communication: Mobility status;Need for lift equipment PT Visit Diagnosis: Muscle weakness (generalized) (M62.81);Other symptoms and signs involving the nervous system (R29.898);Hemiplegia and hemiparesis Hemiplegia - Right/Left: Right Hemiplegia - dominant/non-dominant: Dominant Hemiplegia - caused by: Cerebral infarction     Time: 8295-6213 PT Time Calculation (min) (ACUTE ONLY): 36 min  Charges:  $Therapeutic Activity: 8-22 mins                     Arlyss Gandy, PT, DPT Acute Rehabilitation Pager: [phone]    Arlyss Gandy 04/08/2020, 4:34 PM

## 2020-04-08 NOTE — Progress Notes (Signed)
Occupational Therapy Treatment Note  Pt more alert and participating during session. Movement patterns become more fluid with repetition. Pushing toward R in sitting with decreased attention to R. Pt closing L eye at time - ? Diplopia. Daughter engaging during session. Continue to recommend rehab at CIR to maximize functional level of independence due to deficits listed below. Pt will require 24/7 assistance after DC from [facility].     04/08/20 1700  OT Visit Information  Last OT Received On 04/08/20  Assistance Needed +2  PT/OT/SLP Co-Evaluation/Treatment Yes  Reason for Co-Treatment Complexity of the patient's impairments (multi-system involvement);For patient/therapist safety;To address functional/ADL transfers;Necessary to address cognition/behavior during functional activity  OT goals addressed during session ADL's and self-care  History of Present Illness 59 y.o. male past medical history of diabetes, hypertension which he does not take medication for, presented to the emergency room at [facility] with last known well of 5 AM when he was at work and was noted to have a sudden onset of right-sided weakness and inability to talk. Pt given TPA and s/p thrombectomy on 4/20.  Was intubated and restrained as of 4/22,  extubated 4/28 and now drowsy.  L MCA revascularization with tPA, acute respiratory failure from possible Asp PNA, now referred to rehab.  PMHx:  DM, HTN,  On 5/2 pt with worsening of bleed to 87m, transferred to ICU and placed on 3% saline.  Precautions  Precautions Fall  Precaution Comments cortrak  Pain Assessment  Pain Assessment Faces  Faces Pain Scale 6  Pain Location grimacing prior to condom cath bursting  Pain Descriptors / Indicators Grimacing  Pain Intervention(s) Limited activity within patient's tolerance  Cognition  Arousal/Alertness Awake/alert  Behavior During Therapy Flat affect  Overall Cognitive Status Impaired/Different from baseline  Area of Impairment  Problem solving;Awareness;Following commands;Attention;Orientation  Current Attention Level Focused (able to sustain when rubbing lotion on leg)  Memory Decreased recall of precautions;Decreased short-term memory  Following Commands Follows one step commands inconsistently  Safety/Judgement Decreased awareness of safety  Awareness Intellectual  Problem Solving Slow processing;Decreased initiation;Difficulty sequencing;Requires tactile cues;Requires verbal cues  General Comments pt inconsistent with command following, remain unsure if pt responds better to [place] or spanish at this time. Pt follow demonstration and performs motor commands better with initiation. Pt seems to follow task based commands better as well (I.E. rolling to clean, applying lotion to LEs)  Difficult to assess due to Non-English speaking;Impaired communication  Upper Extremity Assessment  Upper Extremity Assessment RUE deficits/detail  RUE Deficits / Details beginning to move in synergy with yawning; grimaces to painful stimuli RUE; flaccid; Scapular ROM WFL; nonfunctional use of RUE  RUE Coordination decreased fine motor;decreased gross motor  Lower Extremity Assessment  Lower Extremity Assessment Defer to PT evaluation  ADL  Overall ADL's  Needs assistance/impaired  Eating/Feeding NPO  Grooming Wash/dry face;Minimal assistance  Grooming Details (indicate cue type and reason) pt given cues then cloth brought to face adn pt wiped out his eyes  Lower Body Dressing Total assistance  Toileting- Clothing Manipulation and Hygiene Total assistance  Toileting - Clothing Manipulation Details (indicate cue type and reason) Pt standing and indicating through gestures, increased restlessness that he needed something prior to his catheter busting and urine pouring on floor  Functional mobility during ADLs Maximal assistance;+2 for physical assistance  General ADL Comments Sitting EOB pt rubbed lotion on BLE, leaning ofrward to rub  on lower legs; required initiation of task initially then became more fluid with movement  Bed Mobility  Overal bed mobility Needs Assistance  Bed Mobility Rolling;Supine to Sit;Sit to Supine  Rolling Max assist - pt assisting with rolling toard R with mod A ; Max A toward L - assisted with scooting up in bed by pulling on bed rail; linens changed due to incontinence  Supine to sit Max assist;+2 for physical assistance  Sit to supine Max assist;+2 for physical assistance  General bed mobility comments Cues given behinf L shoulder an pt initiated rolling toward R. Max A to transition trunk to upright position  Balance  Overall balance assessment Needs assistance  Sitting-balance support No upper extremity supported;Feet supported  Sitting balance-Leahy Scale Zero  Sitting balance - Comments maxA, pt pushing toward R side with WB through LUE  Postural control Right lateral lean  Standing balance support Bilateral upper extremity supported  Standing balance-Leahy Scale Poor  Restrictions  Weight Bearing Restrictions No  Vision- Assessment  Vision Assessment? Vision impaired- to be further tested in functional context  Additional Comments pt shutting L eye at times; unsure if he is having double vision  Transfers  Overall transfer level Needs assistance  Equipment used 2 person hand held assist  Transfers Sit to/from Stand  Sit to Stand +2 physical assistance;Max assist  General transfer comment  pt's arms placed over therapists shoulders. Facilitation givien down through thigh and behind hips to stand. Pt initaited leaning forward and pushing to stand with LLE. R knee blocked  General Exercises - Upper Extremity  Shoulder Flexion Right;PROM;5 reps;Supine  Elbow Flexion Right;5 reps;PROM;Supine  Elbow Extension PROM;Right;5 reps;Supine  Digit Composite Flexion PROM;Right;5 reps;Supine  Composite Extension PROM;Right;5 reps;Supine  OT - End of Session  Equipment Utilized During Treatment  Gait belt  Activity Tolerance Patient tolerated treatment well  Patient left in bed;with call bell/phone within reach;with bed alarm set;with family/visitor present  Nurse Communication Mobility status;Need for lift equipment  OT Assessment/Plan  OT Plan Discharge plan remains appropriate  OT Visit Diagnosis Unsteadiness on feet (R26.81);Other abnormalities of gait and mobility (R26.89);Muscle weakness (generalized) (M62.81);Pain;Hemiplegia and hemiparesis  Hemiplegia - Right/Left Right  Hemiplegia - dominant/non-dominant Dominant  Hemiplegia - caused by Cerebral infarction  Pain - part of body  (abdomen)  OT Frequency (ACUTE ONLY) Min 2X/week  Recommendations for Other Services PT consult;Rehab consult;Speech consult  Follow Up Recommendations CIR;Supervision/Assistance - 24 hour  OT Equipment Other (comment) (TBA)  AM-PAC OT "6 Clicks" Daily Activity Outcome Measure (Version 2)  Help from another person eating meals? 1  Help from another person taking care of personal grooming? 2  Help from another person toileting, which includes using toliet, bedpan, or urinal? 1  Help from another person bathing (including washing, rinsing, drying)? 1  Help from another person to put on and taking off regular upper body clothing? 1  Help from another person to put on and taking off regular lower body clothing? 1  6 Click Score 7  OT Goal Progression  Progress towards OT goals Progressing toward goals (goals remain appropriate)  Acute Rehab OT Goals  Patient Stated Goal Per family, return home safely  OT Goal Formulation With patient/family  Time For Goal Achievement 04/16/20  Potential to Achieve Goals Good  ADL Goals  Pt Will Perform Grooming with mod assist;sitting  Pt Will Transfer to Toilet with mod assist;with +2 assist;stand pivot transfer;bedside commode  Additional ADL Goal #1 Pt will perform bed mobility with Min A +2 in preparation for ADLs  Additional ADL Goal #2 Pt will tolerate  sitting at EOB for ~10 minutes with Min A in preparation for ADLs  OT Time Calculation  OT Start Time (ACUTE ONLY) 1544  OT Stop Time (ACUTE ONLY) 1627  OT Time Calculation (min) 43 min  OT General Charges  $OT Visit 1 Visit  OT Treatments  $Self Care/Home Management  8-22 mins  $Neuromuscular Re-education 8-22 mins  Luisa Dago, OT/L   Acute OT Clinical Specialist Acute Rehabilitation Services Pager [phone] Office [phone]

## 2020-04-08 NOTE — Progress Notes (Signed)
Modified Barium Swallow Progress Note  Patient Details  Name: Brad Delacruz MRN: [id number] Date of Birth: 10/29/1961  Today's Date: 04/08/2020  Modified Barium Swallow completed.  Full report located under Chart Review in the Imaging Section.  Brief recommendations include the following:  Clinical Impression  Pt presents wtih a moderate oropharyngeal dysphagia with delays in oral transit and pharyngeal swallow initiation. His daughter, Verlee Monte, said that his time for oral transit is not any longer than it is at his baseline, but during MBS he was not able to masticate even a small piece of graham cracker. The whole piece spilled anteriorly from his mouth. He has lingual pumping and reduced organization. At times his pyriform sinuses become more full before the swallow as he continues to clear his oral cavity. When this happens with nectar thick liquids, he penetrates and aspirates during the swallow as they spill into his airway. He did have an episode of aspiration before the swallow when they spilled directly into the airway when he had his head in a posterior tilt. Pt does not immediately cough in response and he does not cough on command, but a delayed cough was suspicious for delayed sensation. He had good airway protection and pharyngeal clearance with purees. Intermittent penetration occurred with honey thick liquids but was trace and cleared with subsequent swallows. Recommend starting Dys 1 diet and honey thick liquids by spoon with full supervision. He has potential for diet advancement if he can improve his oral control and/or follow commands more consistently for use of compensatory strategies.    Swallow Evaluation Recommendations       SLP Diet Recommendations: Dysphagia 1 (Puree) solids;Honey thick liquids   Liquid Administration via: Spoon   Medication Administration: Crushed with puree   Supervision: Staff to assist with self feeding;Full supervision/cueing for compensatory  strategies   Compensations: Slow rate;Small sips/bites   Postural Changes: Seated upright at 90 degrees   Oral Care Recommendations: Oral care BID   Other Recommendations: Have oral suction available     Mahala Menghini., M.A. CCC-SLP Acute Rehabilitation Services Pager [phone] Office [phone]  04/08/2020,2:33 PM

## 2020-04-08 NOTE — Progress Notes (Signed)
  Speech Language Pathology Treatment: Dysphagia  Patient Details Name: Brad Delacruz MRN: [id number] DOB: 11/20/1961 Today's Date: 04/08/2020 Time: 1155-1209 SLP Time Calculation (min) (ACUTE ONLY): 14 min  Assessment / Plan / Recommendation Clinical Impression  Pt was seen for dysphagia tx with his daughter assisting with translation as she says that while pt is fluent in English at baseline, he seems to be responding more when spoken to in Spanish now. He is very alert but still has slow oral clearance. His bolus acceptance and transit time improve as trials continued given Mod cues. Immediate coughing follows trials of thin liquids and there is a single cough after all trials are completed, but no overt coughing after purees and nectar thick liquids could imply improved safety. Given signs of dysphagia and significant aphasia, MBS is warranted. Will proceed with swallow study today. Pt/family and RN in agreement.   HPI HPI: 59 y.o. male past medical history of diabetes, hypertension which he does not take medication for, presented to the emergency room at [facility] with last known well of 5 AM when he was at work and was noted to have a sudden onset of right-sided weakness and inability to talk. Pt given TPA and s/p thrombectomy on 4/20. Pt remains intubated and restrained as of 4/22.  PT was intubated from 4/20-4/22.  Head CT reported a large left MCA infarct.        SLP Plan  MBS       Recommendations  Diet recommendations: NPO Medication Administration: Via alternative means                Oral Care Recommendations: Oral care QID Follow up Recommendations: Inpatient Rehab SLP Visit Diagnosis: Dysphagia, oropharyngeal phase (R13.12) Plan: MBS       GO                 Mahala Menghini., M.A. CCC-SLP Acute Rehabilitation Services Pager [phone] Office [phone]  04/08/2020, 12:22 PM

## 2020-04-09 LAB — BASIC METABOLIC PANEL
Anion gap: 5 (ref 5–15)
BUN: 23 mg/dL — ABNORMAL HIGH (ref 6–20)
CO2: 24 mmol/L (ref 22–32)
Calcium: 8.9 mg/dL (ref 8.9–10.3)
Chloride: 109 mmol/L (ref 98–111)
Creatinine, Ser: 0.72 mg/dL (ref 0.61–1.24)
GFR calc Af Amer: >60 mL/min (ref >60)
GFR calc non Af Amer: >60 mL/min (ref >60)
Glucose, Bld: 130 mg/dL — ABNORMAL HIGH (ref 70–99)
Potassium: 3.5 mmol/L (ref 3.5–5.1)
Sodium: 138 mmol/L (ref 135–145)

## 2020-04-09 LAB — CBC
HCT: 35.8 % — ABNORMAL LOW (ref 39.0–52.0)
Hemoglobin: 12.1 g/dL — ABNORMAL LOW (ref 13.0–17.0)
MCH: 30.9 pg (ref 26.0–34.0)
MCHC: 33.8 g/dL (ref 30.0–36.0)
MCV: 91.6 fL (ref 80.0–100.0)
Platelets: 315 10*3/uL (ref 150–400)
RBC: 3.91 MIL/uL — ABNORMAL LOW (ref 4.22–5.81)
RDW: 12.5 % (ref 11.5–15.5)
WBC: 9.3 10*3/uL (ref 4.0–10.5)
nRBC: 0 % (ref 0.0–0.2)

## 2020-04-09 LAB — GLUCOSE, CAPILLARY
Glucose-Capillary: 112 mg/dL — ABNORMAL HIGH (ref 70–99)
Glucose-Capillary: 185 mg/dL — ABNORMAL HIGH (ref 70–99)
Glucose-Capillary: 200 mg/dL — ABNORMAL HIGH (ref 70–99)
Glucose-Capillary: 190 mg/dL — ABNORMAL HIGH (ref 70–99)
Glucose-Capillary: 233 mg/dL — ABNORMAL HIGH (ref 70–99)

## 2020-04-09 MED ORDER — OSMOLITE 1.5 CAL PO LIQD
600.0000 mL | ORAL | Status: DC
  Administered 2020-04-10 – 2020-04-14 (×5): 600 mL
  Filled 2020-04-09 (×2): qty 1000
  Filled 2020-04-09: qty 711
  Filled 2020-04-09 (×2): qty 1000
  Filled 2020-04-09: qty 711
  Filled 2020-04-09: qty 1000

## 2020-04-09 MED ORDER — LOSARTAN POTASSIUM 50 MG PO TABS
50.0000 mg | ORAL_TABLET | Freq: Every day | ORAL | Status: DC
  Administered 2020-04-10 – 2020-04-15 (×6): 50 mg
  Filled 2020-04-09 (×6): qty 1

## 2020-04-09 MED ORDER — INSULIN ASPART 100 UNIT/ML ~~LOC~~ SOLN
3.0000 [IU] | Freq: Three times a day (TID) | SUBCUTANEOUS | Status: DC
  Administered 2020-04-10 (×2): 3 [IU] via SUBCUTANEOUS

## 2020-04-09 MED ORDER — OSMOLITE 1.5 CAL PO LIQD
720.0000 mL | ORAL | Status: DC
  Administered 2020-04-09: 720 mL
  Filled 2020-04-09: qty 948

## 2020-04-09 MED ORDER — FREE WATER
150.0000 mL | Status: DC
  Administered 2020-04-09 (×2): 150 mL

## 2020-04-09 MED ORDER — FREE WATER: 100.0000 mL | Status: DC

## 2020-04-09 MED ORDER — INSULIN ASPART 100 UNIT/ML ~~LOC~~ SOLN
0.0000 [IU] | SUBCUTANEOUS | Status: DC
  Administered 2020-04-09: 5 [IU] via SUBCUTANEOUS
  Administered 2020-04-10: 8 [IU] via SUBCUTANEOUS
  Administered 2020-04-10 – 2020-04-11 (×6): 5 [IU] via SUBCUTANEOUS
  Administered 2020-04-11: 3 [IU] via SUBCUTANEOUS
  Administered 2020-04-11 (×2): 8 [IU] via SUBCUTANEOUS
  Administered 2020-04-11: 3 [IU] via SUBCUTANEOUS
  Administered 2020-04-11 (×2): 8 [IU] via SUBCUTANEOUS
  Administered 2020-04-12: 5 [IU] via SUBCUTANEOUS
  Administered 2020-04-12: 8 [IU] via SUBCUTANEOUS
  Administered 2020-04-12: 3 [IU] via SUBCUTANEOUS
  Administered 2020-04-12: 15 [IU] via SUBCUTANEOUS
  Administered 2020-04-12 – 2020-04-13 (×3): 8 [IU] via SUBCUTANEOUS
  Administered 2020-04-13: 13 [IU] via SUBCUTANEOUS
  Administered 2020-04-13 (×3): 8 [IU] via SUBCUTANEOUS
  Administered 2020-04-14: 5 [IU] via SUBCUTANEOUS
  Administered 2020-04-14: 3 [IU] via SUBCUTANEOUS
  Administered 2020-04-14 (×2): 5 [IU] via SUBCUTANEOUS
  Administered 2020-04-14: 8 [IU] via SUBCUTANEOUS
  Administered 2020-04-14: 5 [IU] via SUBCUTANEOUS
  Administered 2020-04-14 – 2020-04-15 (×2): 8 [IU] via SUBCUTANEOUS
  Administered 2020-04-15: 3 [IU] via SUBCUTANEOUS
  Administered 2020-04-15 (×3): 5 [IU] via SUBCUTANEOUS

## 2020-04-09 MED ORDER — PRO-STAT SUGAR FREE PO LIQD
30.0000 mL | Freq: Every day | ORAL | Status: DC
  Administered 2020-04-09 – 2020-04-13 (×5): 30 mL
  Filled 2020-04-09 (×5): qty 30

## 2020-04-09 MED ORDER — FREE WATER
100.0000 mL | Freq: Four times a day (QID) | Status: DC
  Administered 2020-04-09 – 2020-04-15 (×22): 100 mL

## 2020-04-09 NOTE — TOC Progression Note (Signed)
Transition of Care (TOC) - Progression Note    Patient Details  Name: Brad Delacruz MRN: [id number] Date of Birth: 07/25/1961  Transition of Care (TOC) CM/SW Contact   F , RN Phone Number: 04/09/2020, 1:40 PM  Clinical Narrative:    CM received information from CIR that they have a long waitlist for admission. CM met with the patient, his spouse and called his daughter on the phone in the room. CM offered a different inpatient rehab: [facility] or the 2 in [place]. Daughter says they will have to discuss as a family but that the one at [facility] is convenient for them and they most likely will hold out for [facility].  TOC following.    Expected Discharge Plan: IP Rehab Facility Barriers to Discharge: Continued Medical Work up  Expected Discharge Plan and Services Expected Discharge Plan: IP Rehab Facility   Discharge Planning Services: CM Consult   Living arrangements for the past 2 months: Single Family Home                                       Social Determinants of Health (SDOH) Interventions    Readmission Risk Interventions No flowsheet data found.  

## 2020-04-09 NOTE — Progress Notes (Signed)
Physical Therapy Treatment Patient Details Name: Brad Delacruz MRN: [id number] DOB: 04-10-1961 Today's Date: 04/09/2020    History of Present Illness 59 y.o. male past medical history of diabetes, hypertension which he does not take medication for, presented to the emergency room at [facility] with last known well of 5 AM when he was at work and was noted to have a sudden onset of right-sided weakness and inability to talk. Pt given TPA and s/p thrombectomy on 4/20.  Was intubated and restrained as of 4/22,  extubated 4/28 and now drowsy.  L MCA revascularization with tPA, acute respiratory failure from possible Asp PNA, now referred to rehab.  PMHx:  DM, HTN,  On 5/2 pt with worsening of bleed to 87m, transferred to ICU and placed on 3% saline.    PT Comments    Pt required mod assist rolling R, max assist rolling left, +2 max assist supine to/from sit and max assist to maintain sitting balance EOB. Pt following simple commands 25% of trials. Unsure if related to language barrier. Pt may benefit from in person Spanish interpreter during sessions. Per chart, pt/family speak English. Wife present during session and related she does not speak [place]. Pt nonverbal during session. Pt remained in bed at end of session. [place]ove indicated for transfers to recliner.    Follow Up Recommendations  [facility]     Equipment Recommendations  Wheelchair (measurements PT);Wheelchair cushion (measurements PT);Hospital bed    Recommendations for Other Services       Precautions / Restrictions Precautions Precautions: Fall;Other (comment) Precaution Comments: cortrak    Mobility  Bed Mobility Overal bed mobility: Needs Assistance Bed Mobility: Rolling;Supine to Sit;Sit to Sidelying Rolling: Max assist;Mod assist   Supine to sit: +2 for physical assistance;Max assist   Sit to sidelying: +2 for physical assistance;Max assist General bed mobility comments: cues for sequencing, assist for all  aspects of mobility. Pt able to roll right mod assist and left max assist.  Transfers                 General transfer comment: Attempted sit to stand +2 max assist without success. Pt unable to clear hips from bed.  Ambulation/Gait                 Stairs             Wheelchair Mobility    Modified Rankin (Stroke Patients Only) Modified Rankin (Stroke Patients Only) Pre-Morbid Rankin Score: No symptoms Modified Rankin: Severe disability     Balance Overall balance assessment: Needs assistance Sitting-balance support: Single extremity supported;No upper extremity supported Sitting balance-Leahy Scale: Zero Sitting balance - Comments: max assist to maintain sitting balance EOB Postural control: Right lateral lean;Posterior lean                                  Cognition Arousal/Alertness: Awake/alert Behavior During Therapy: Flat affect Overall Cognitive Status: Impaired/Different from baseline Area of Impairment: Problem solving;Awareness;Following commands;Attention;Orientation                 Orientation Level: Place;Time;Situation;Disoriented to Current Attention Level: Focused Memory: Decreased recall of precautions;Decreased short-term memory Following Commands: Follows one step commands inconsistently Safety/Judgement: Decreased awareness of safety;Decreased awareness of deficits Awareness: Intellectual Problem Solving: Slow processing;Decreased initiation;Difficulty sequencing;Requires tactile cues;Requires verbal cues General Comments: Following simple commands approx 25% of trials. Difficult to discern if cognition or language barrier.  Exercises Other Exercises Other Exercises: bridging in supine x 10 reps. Assist for BLE to maintained flexed position with feet supported on bed. Pt able to initiate and complete with verbal cues.    General Comments General comments (skin integrity, edema, etc.): Wife present during  session.      Pertinent Vitals/Pain Pain Assessment: Faces Faces Pain Scale: No hurt    Home Living                      Prior Function            PT Goals (current goals can now be found in the care plan section) Acute Rehab PT Goals Patient Stated Goal: not stated PT Goal Formulation: With family Time For Goal Achievement: 04/23/20 Potential to Achieve Goals: Fair Progress towards PT goals: Progressing toward goals    Frequency    Min 4X/week      PT Plan Current plan remains appropriate    Co-evaluation PT/OT/SLP Co-Evaluation/Treatment: Yes Reason for Co-Treatment: Complexity of the patient's impairments (multi-system involvement);For patient/therapist safety;Necessary to address cognition/behavior during functional activity;To address functional/ADL transfers PT goals addressed during session: Mobility/safety with mobility;Balance        AM-PAC PT "6 Clicks" Mobility   Outcome Measure  Help needed turning from your back to your side while in a flat bed without using bedrails?: A Lot Help needed moving from lying on your back to sitting on the side of a flat bed without using bedrails?: Total Help needed moving to and from a bed to a chair (including a wheelchair)?: Total Help needed standing up from a chair using your arms (e.g., wheelchair or bedside chair)?: Total Help needed to walk in hospital room?: Total Help needed climbing 3-5 steps with a railing? : Total 6 Click Score: 7    End of Session Equipment Utilized During Treatment: Gait belt Activity Tolerance: Patient tolerated treatment well Patient left: with call bell/phone within reach;with bed alarm set;in bed;with family/visitor present Nurse Communication: Mobility status;Need for lift equipment PT Visit Diagnosis: Muscle weakness (generalized) (M62.81);Other symptoms and signs involving the nervous system (R29.898);Hemiplegia and hemiparesis Hemiplegia - Right/Left: Right Hemiplegia -  dominant/non-dominant: Dominant Hemiplegia - caused by: Cerebral infarction     Time: 1135-1158 PT Time Calculation (min) (ACUTE ONLY): 23 min  Charges:  $Therapeutic Activity: 8-22 mins                     Aida Raider, PT  Office # [phone] Pager [phone]    Ilda Foil 04/09/2020, 2:02 PM

## 2020-04-09 NOTE — Progress Notes (Signed)
STROKE TEAM PROGRESS NOTE   INTERVAL HISTORY Wife and speech therapist are at bedside. Pt sitting in bed eating apple sauce. He passed swallow yesterday and on honey thick liquid. Able to swallow but slow and not active eating. Will change tube feeding to nocturnal feeding to encourage po intake.    Vitals:   04/08/20 2349 04/09/20 0345 04/09/20 0733 04/09/20 1203  BP: 121/72 107/73 132/69 111/69  Pulse: 80 72 79 89  Resp: 18 18 18    Temp: 97.6 F (36.4 C) 97.6 F (36.4 C) 98.1 F (36.7 C) 98 F (36.7 C)  TempSrc: Oral Oral Oral Oral  SpO2: 95% 97% 95% 94%  Weight:      Height:        CBC:  Recent Labs  Lab 04/03/20 0319 04/04/20 0406 04/08/20 0726 04/09/20 0410  WBC 12.3*   < > 9.3 9.3  NEUTROABS 10.1*  --   --   --   HGB 12.2*   < > 12.5* 12.1*  HCT 35.1*   < > 37.0* 35.8*  MCV 90.7   < > 91.8 91.6  PLT 153   < > 300 315   < > = values in this interval not displayed.    Basic Metabolic Panel:  Recent Labs  Lab 04/03/20 0319 04/04/20 0406 04/08/20 0726 04/09/20 0410  NA 143   < > 144 138  K 3.2*   < > 3.5 3.5  CL 109   < > 115* 109  CO2 25   < > 23 24  GLUCOSE 212*   < > 181* 130*  BUN 16   < > 21* 23*  CREATININE 0.83   < > 0.71 0.72  CALCIUM 9.0   < > 9.2 8.9  PHOS 2.4*  --   --   --    < > = values in this interval not displayed.   IMAGING past 24 hours DG Swallowing Func-Speech Pathology  Result Date: 04/08/2020 Objective Swallowing Evaluation: Type of Study: MBS-Modified Barium Swallow Study  Patient Details Name: Mohannad Olivero MRN: Signe Colt Date of Birth: Jun 14, 1961 Today's Date: 04/08/2020 Time: SLP Start Time (ACUTE ONLY): 1250 -SLP Stop Time (ACUTE ONLY): 1322 SLP Time Calculation (min) (ACUTE ONLY): 32 min Past Medical History: Past Medical History: Diagnosis Date . Diabetes mellitus without complication [facility])  Past Surgical History: Past Surgical History: Procedure Laterality Date . IR CT HEAD LTD  03/24/2020 . IR CT HEAD LTD  03/24/2020 . IR INTRA  CRAN STENT  03/24/2020 . IR PATIENT EVAL TECH 0-60 MINS  03/25/2020 . IR PERCUTANEOUS ART THROMBECTOMY/INFUSION INTRACRANIAL INC DIAG ANGIO  03/24/2020 . RADIOLOGY WITH ANESTHESIA N/A 03/24/2020  Procedure: IR WITH ANESTHESIA;  Surgeon: 03/26/2020, MD;  Location: [facility] OR;  Service: Radiology;  Laterality: N/A; HPI: 59 y.o. male past medical history of diabetes, hypertension which he does not take medication for, presented to the emergency room at [facility] with last known well of 5 AM when he was at work and was noted to have a sudden onset of right-sided weakness and inability to talk. Pt given TPA and s/p thrombectomy on 4/20. Pt remains intubated and restrained as of 4/22.  PT was intubated from 4/20-4/22.  Head CT reported a large left MCA infarct.   Subjective: pt alert, cooperative Assessment / Plan / Recommendation CHL IP CLINICAL IMPRESSIONS 04/08/2020 Clinical Impression Pt presents wtih a moderate oropharyngeal dysphagia with delays in oral transit and pharyngeal swallow initiation. His daughter, 06/08/2020, said that his  time for oral transit is not any longer than it is at his baseline, but during MBS he was not able to masticate even a small piece of graham cracker. The whole piece spilled anteriorly from his mouth. He has lingual pumping and reduced organization. At times his pyriform sinuses become more full before the swallow as he continues to clear his oral cavity. When this happens with nectar thick liquids, he penetrates and aspirates during the swallow as they spill into his airway. He did have an episode of aspiration before the swallow when they spilled directly into the airway when he had his head in a posterior tilt. Pt does not immediately cough in response and he does not cough on command, but a delayed cough was suspicious for delayed sensation. He had good airway protection and pharyngeal clearance with purees. Intermittent penetration occurred with honey thick liquids but was trace  and cleared with subsequent swallows. Recommend starting Dys 1 diet and honey thick liquids by spoon with full supervision. He has potential for diet advancement if he can improve his oral control and/or follow commands more consistently for use of compensatory strategies.  SLP Visit Diagnosis Dysphagia, oropharyngeal phase (R13.12) Attention and concentration deficit following -- Frontal lobe and executive function deficit following -- Impact on safety and function Moderate aspiration risk   CHL IP TREATMENT RECOMMENDATION 04/08/2020 Treatment Recommendations Therapy as outlined in treatment plan below   Prognosis 04/08/2020 Prognosis for Safe Diet Advancement Good Barriers to Reach Goals Language deficits Barriers/Prognosis Comment -- CHL IP DIET RECOMMENDATION 04/08/2020 SLP Diet Recommendations Dysphagia 1 (Puree) solids;Honey thick liquids Liquid Administration via Spoon Medication Administration Crushed with puree Compensations Slow rate;Small sips/bites Postural Changes Seated upright at 90 degrees   CHL IP OTHER RECOMMENDATIONS 04/08/2020 Recommended Consults -- Oral Care Recommendations Oral care BID Other Recommendations Have oral suction available   CHL IP FOLLOW UP RECOMMENDATIONS 04/08/2020 Follow up Recommendations Inpatient Rehab   CHL IP FREQUENCY AND DURATION 04/08/2020 Speech Therapy Frequency (ACUTE ONLY) min 2x/week Treatment Duration 2 weeks      CHL IP ORAL PHASE 04/08/2020 Oral Phase Impaired Oral - Pudding Teaspoon -- Oral - Pudding Cup -- Oral - Honey Teaspoon Weak lingual manipulation;Lingual pumping;Reduced posterior propulsion;Holding of bolus;Delayed oral transit;Decreased bolus cohesion;Premature spillage Oral - Honey Cup Weak lingual manipulation;Lingual pumping;Reduced posterior propulsion;Holding of bolus;Delayed oral transit;Decreased bolus cohesion;Premature spillage Oral - Nectar Teaspoon Weak lingual manipulation;Lingual pumping;Reduced posterior propulsion;Holding of bolus;Delayed oral  transit;Decreased bolus cohesion;Premature spillage Oral - Nectar Cup Weak lingual manipulation;Lingual pumping;Reduced posterior propulsion;Holding of bolus;Delayed oral transit;Decreased bolus cohesion;Premature spillage Oral - Nectar Straw -- Oral - Thin Teaspoon -- Oral - Thin Cup -- Oral - Thin Straw -- Oral - Puree Weak lingual manipulation;Lingual pumping;Reduced posterior propulsion;Holding of bolus;Delayed oral transit;Decreased bolus cohesion;Premature spillage Oral - Mech Soft Impaired mastication;Right anterior bolus loss Oral - Regular -- Oral - Multi-Consistency -- Oral - Pill -- Oral Phase - Comment --  CHL IP PHARYNGEAL PHASE 04/08/2020 Pharyngeal Phase Impaired Pharyngeal- Pudding Teaspoon -- Pharyngeal -- Pharyngeal- Pudding Cup -- Pharyngeal -- Pharyngeal- Honey Teaspoon Delayed swallow initiation-vallecula;Penetration/Aspiration before swallow Pharyngeal Material enters airway, remains ABOVE vocal cords then ejected out Pharyngeal- Honey Cup Delayed swallow initiation-vallecula;Delayed swallow initiation-pyriform sinuses;Penetration/Aspiration before swallow Pharyngeal Material enters airway, remains ABOVE vocal cords then ejected out Pharyngeal- Nectar Teaspoon Delayed swallow initiation-pyriform sinuses;Penetration/Aspiration before swallow;Penetration/Aspiration during swallow Pharyngeal Material enters airway, passes BELOW cords without attempt by patient to eject out (silent aspiration) Pharyngeal- Nectar Cup Delayed swallow initiation-pyriform sinuses;Penetration/Aspiration during  swallow;Penetration/Aspiration before swallow Pharyngeal Material enters airway, passes BELOW cords without attempt by patient to eject out (silent aspiration) Pharyngeal- Nectar Straw -- Pharyngeal -- Pharyngeal- Thin Teaspoon -- Pharyngeal -- Pharyngeal- Thin Cup -- Pharyngeal -- Pharyngeal- Thin Straw -- Pharyngeal -- Pharyngeal- Puree Delayed swallow initiation-vallecula Pharyngeal -- Pharyngeal- Mechanical Soft  -- Pharyngeal -- Pharyngeal- Regular -- Pharyngeal -- Pharyngeal- Multi-consistency -- Pharyngeal -- Pharyngeal- Pill -- Pharyngeal -- Pharyngeal Comment --  CHL IP CERVICAL ESOPHAGEAL PHASE 04/08/2020 Cervical Esophageal Phase WFL Pudding Teaspoon -- Pudding Cup -- Honey Teaspoon -- Honey Cup -- Nectar Teaspoon -- Nectar Cup -- Nectar Straw -- Thin Teaspoon -- Thin Cup -- Thin Straw -- Puree -- Mechanical Soft -- Regular -- Multi-consistency -- Pill -- Cervical Esophageal Comment -- Mahala Menghini., M.A. CCC-SLP Acute Rehabilitation Services Pager [phone] Office [phone] 04/08/2020, 2:38 PM               PHYSICAL EXAM    Temp:  [97.6 F (36.4 C)-98.1 F (36.7 C)] 98 F (36.7 C) (05/06 1203) Pulse Rate:  [71-89] 89 (05/06 1203) Resp:  [18-21] 18 (05/06 0733) BP: (107-140)/(68-74) 111/69 (05/06 1203) SpO2:  [94 %-97 %] 94 % (05/06 1203)  General - Well nourished, well developed, not in acute distress  Ophthalmologic - fundi not visualized due to noncooperation.  Cardiovascular - Regular rhythm and rate.  Neuro - eyes open but global aphasia, did not follow commands.  Eyes left gaze deviation able to cross midline slightly, not blinking to visual threat on the right, blinking to threat on the left,  able to track on the left but not right. Mild lower right facial droop.  Tongue protrusion not cooperative. Spontaneous movement of LUE and LLE, LUE at least 4/5, no drift when lifted in air. LLE 3/5 with pain stimulation. RUE flaccid, right-sided neglect, RLE slight withdraw to pain. DTR 1+. Right upgoing toe, left downgoing toe. Sensation, coordination and gait not tested.   ASSESSMENT/PLAN [name] is a 59 y.o. male with history of HTN and DB noncompliant w/ medications presenting to [facility] with R sided weakness and inability to talk. NIH 28. CTA showed L M1 occlusion. Transferred to [facility] [facility] [employer] [facility]. [facility]. On arrival, received IV tPA 03/24/2020 at 0725 followed  by mechanical thrombectomy w/ TICI2C revascularization and rescue stent placement. Admitted to the ICU. transititioned out with transfer back to neuro ICU 5/1 with worsening edema for hypertonic saline protocol.    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 Old L corona radiata and internal capsule infarcts. ASPECTS 10.     CTA head & neck large L MCA territory infarct. L M1 LVO. Collateral vessels in sylvian fissure. B ICA bifurcation atherosclerosis. Aortic atherosclerosis.   CT perfusion large L MCA penumbra.   Cerebral angio L MCA M1 occlusion w/ TICI2c revascularization x 6 passes. Rescue stenting for reocclusion   CT head 4/21 - evolving large left MCA infarct, trace midline shift, persistent high density and left C1 fissure, contrast versus blood  MRI  Large L MCA infarct evolution w/ edema and mass effect w/ 32mm L midline shift. SAH L sylvian fissure, L cerebral cortical sulci and basilar cisterns.  MRA  L M1 stent w/o flow c/w reocclusion. L ICA filling defect c/w  small thrombus. VBJ w/ infundibulum vs aneurysm.  CT head 4/23 continued evolution large L MCA infarct w/ increased edema, now w/ 5-54mm midline shift. Stable L SAH w/ trace blood in tentorium.    CT head 4/24 - stable 5 mm shift. Large Lt MCA infarct. Subarachnoid blood and/or contrast in and around the left Sylvian fissure.   CT Head 03/29/2020 - Sequelae of large Left MCA territory infarct remain stable since 03/27/2020. Rightward midline shift remains 5 mm.  CT Head 04/04/2020 - Evolving large left MCA territory infarction with probable petechial hemorrhage. Progression of edema and mass effect with midline shift now measuring 9 mm and probable early trapping of the right lateral ventricle.  CT Head 04/06/2020 - unchanged,  still w/ 38mm midline shift    CT Head 04/08/2020 - evolving L MCA infarct w/ similar mass effect   2D Echo EF 55-60%. No source of embolus   LE doppler neg DVT  May consider TEE and loop recorder at outpt with neuro follow up if further neuro improvement   TCD bubble study positive for clinically insignificant PFO     LDL 55.1  TG 798->675->269 (on 4/22)   HgbA1c 9.2  SCDs for VTE prophylaxis  No antithrombotic prior to admission, now on aspirin 81 mg daily and Brilinta (ticagrelor) 90 mg bid given stent placement and L tICA thrombus.   Therapy recommendations: CIR  Disposition:  pending   Medically ready for d/c to rehab  TOC considering non-[facility] rehab facility  Cerebral edema Induced Hypernatremia  CT showed large left MCA infarct with minimal midline shift  MRI  Large L MCA infarct evolution w/ edema and mass effect w/ 51mm L midline shift.   CT head 4/23 continued evolution large L MCA infarct w/ increased edema, now w/ 5-26mm midline shift. Stable L SAH w/ trace blood in tentorium.   CT head 4/25 stable 19mm MLS  CT Head 04/04/2020 - Evolving large left MCA territory infarction with probable petechial hemorrhage. Progression of edema and mass effect with midline shift now measuring 9 mm and probable early trapping of the right lateral ventricle.  CT Head 04/06/2020 - unchanged, still w/ 25mm midline shift    CT Head 04/08/2020 - evolving L MCA infarct w/ similar mass effect  3% saline off  Na 146->144->138  Acute Respiratory Failure   Intubated for IR, left intubated post IR for airway protection secondary to acute L MCA stroke  Extubation 4/22  Tolerating well so far  Temp - afebrile ; WBCs - 9.3  Ancef 4/27>>5/2 for pneumonia  Hypertensive Emergency  SBP > 210 on arrival  Home meds:  Non-compliant - med rec lists:  losartan 25   Cleviprex changed to cardene d/t elevated TG -> now off  BP stable on the low end  Cozaar 25 mg daily -> 50mg  bid-> 50  daily . Hydralazine PRN . Long-term BP goal normotensive  Hyperlipidemia  Home meds:  Non-compliant -  pravachol 10, crestor 5  LDL 55.1, goal < 70  TG 798->675-> 269 (on 4/22)  On lipitor 80  Continue statin at discharge  Diabetes type II Uncontrolled  Home meds:  Noncompliant - med rec lists:  dapagliflozin 10, glipizide 5, actos 30, dapagliflozin 10 / metformin 1000  HgbA1c 9.2, goal < 7.0  CBGs  lantus 30->25 bid, novolog 6u->3u TId   SSI 0-15  Dysphagia . Secondary to stroke . Now on dys 1 and honey thick liquid . Put on nocturnal feeding . Has Cortrak .  Speech on board . Dietitian consult for calore count . Encourage po intake . Free water added - 100 q4h   Other Stroke Risk Factors  Hx ETOH use, alcohol level <10, will advise family that he should drink no more than 2 drink(s) a day  Substance abuse - UDS:  Benzos POSITIVE  Overweight, Body mass index is 28.25 kg/m., recommend weight loss, diet and exercise as appropriate   Other Active Problems  Code status - Full code  Thrombocytopenia PLT - resolved  Hypokalemia K 3.2  supplement ->3.6->3.5->3.0->3.5->3.5 supplement  Urinary retention - I&O cath, foley prn  D/c [facility] day # 16    Rosalin Hawking, MD PhD Stroke Neurology 04/09/2020 1:04 PM    To contact Stroke Continuity provider, please refer to [email or web address] After hours, contact General Neurology

## 2020-04-09 NOTE — Progress Notes (Signed)
  Speech Language Pathology Treatment: Dysphagia;Cognitive-Linquistic  Patient Details Name: Brad Delacruz MRN: [id number] DOB: 19-Jan-1961 Today's Date: 04/09/2020 Time: 9326-7124 SLP Time Calculation (min) (ACUTE ONLY): 37 min  Assessment / Plan / Recommendation Clinical Impression  Pt demonstrates improved ability to sustain arousal, needed 100% tactile cues to correctly manipulate spoon and initiate self feeding. Initiation of oral transit and swallow response improved, though there was significant weak coughing by the end of session and finally one hard cough. Pt also noted to have pooling oral secretions that may have been spilling to trigger cough. Did not attempt honey thick liquids as pt was not interested.  Attempted to elicit spontaneous verbal responses with social language and context in the room. Pt did not verbalize or use communicative gestures even with model or hand over hand assist. Pt responded with a head nod yes several times during session, but when asked basic biographical questions there was no accuracy. In breathing, counting and singing activities pt opened mouth but could not articulate or initiate phonation. Discussed appropriate therapeutic activities with wife. Pt needs to attempt functional tasks independently, does not benefit from total assist in all situations. Will continue efforts.    HPI HPI: 59 y.o. male past medical history of diabetes, hypertension which he does not take medication for, presented to the emergency room at [facility] with last known well of 5 AM when he was at work and was noted to have a sudden onset of right-sided weakness and inability to talk. Pt given TPA and s/p thrombectomy on 4/20. Pt remains intubated and restrained as of 4/22.  PT was intubated from 4/20-4/22.  Head CT reported a large left MCA infarct.        SLP Plan  Continue with current plan of care       Recommendations  Diet recommendations: Dysphagia 1  (puree);Honey-thick liquid Liquids provided via: Teaspoon Medication Administration: Via alternative means Supervision: Patient able to self feed;Staff to assist with self feeding Compensations: Slow rate;Small sips/bites Postural Changes and/or Swallow Maneuvers: Seated upright 90 degrees                Oral Care Recommendations: Oral care BID Follow up Recommendations: Inpatient Rehab SLP Visit Diagnosis: Dysphagia, oropharyngeal phase (R13.12) Plan: Continue with current plan of care       GO              Harlon Ditty, MA CCC-SLP  Acute Rehabilitation Services Pager [phone] Office [phone]   [name] 04/09/2020, 2:38 PM

## 2020-04-09 NOTE — Progress Notes (Signed)
Nutrition Follow-up  DOCUMENTATION CODES:   Not applicable  INTERVENTION:  Transition pt to nocturnal feeding: Osmolite 1.5 cal @ 43m/hr via Cortrak for 12 hours (1800-0600)  365mPro-stat daily via Cortrak  15033mree water Q4H via Cortrak   Tube feeding regimen will provide 1180 kcals (meets 59% of estimated needs), 60 grams protein, 548m4mee water (1448ml60mh free water flushes)  Magic cup TID with meals, each supplement provides 290 kcal and 9 grams of protein  Recommend 1:1 Feeding Assistance with meals   NUTRITION DIAGNOSIS:   Inadequate oral intake related to inability to eat as evidenced by NPO status.  Progressing, pt now on Dysphagia 1 diet with Honey Thick liquids.   GOAL:   Patient will meet greater than or equal to 90% of their needs  Met with TF.   MONITOR:   TF tolerance, Labs  REASON FOR ASSESSMENT:   Consult Enteral/tube feeding initiation and management  ASSESSMENT:   Pt with PMH of DM, HTN which he does not take medication for admitted with L MCA s/p IR for thrombectomy and stent on 4/20.  4/22 NG placed and started on TF 4/23 Cortrak placed; tip gastric  4/25 intubated  4/28 extubated 4/29 not ready for SLP eval; cortrak remains gastric 5/1 tx back to ICU for 3% hypertonic saline  5/4 not ready for swallow study 5/5 MBS completed, Dysphagia 1 with Honey thick liquids  Discussed pt with RN. Per RN, pt eating ~40% of Dysphagia 1 trays. RD will adjust tube feeding to nocturnal to encourage more po intake during the day.   Current tube feeding regimen: Vital AF 1.2 cal @ 50ml/55mUOP: 1,150ml x34mours I/O: +9,738.7ml [id number]  Labs: CBGs 112-185 Medications reviewed and include: Novolog, Lantus  Diet Order:   Diet Order            DIET - DYS 1 Room service appropriate? Yes; Fluid consistency: Honey Thick  Diet effective now              EDUCATION NEEDS:   No education needs have been identified at this time  Skin:   Skin Assessment: Skin Integrity Issues: Skin Integrity Issues:: Other (Comment) Other: MASD scrotum  Last BM:  5/4  Height:   Ht Readings from Last 1 Encounters:  03/30/20 5' 5"  (1.651 m)    Weight:   Wt Readings from Last 1 Encounters:  04/08/20 77 kg    BMI:  Body mass index is 28.25 kg/m.  Estimated Nutritional Needs:   Kcal:  2000-2200  Protein:  105-125 grams  Fluid:  > 2 L/day    Janiya Millirons ALarkin Ina, LDN RD pager number and weekend/on-call pager number located in [email or web address]

## 2020-04-09 NOTE — Progress Notes (Signed)
Occupational Therapy Treatment Patient Details Name: Brad Delacruz MRN: [id number] DOB: 1961-07-24 Today's Date: 04/09/2020    History of present illness 59 y.o. male past medical history of diabetes, hypertension which he does not take medication for, presented to the emergency room at [facility] with last known well of 5 AM when he was at work and was noted to have a sudden onset of right-sided weakness and inability to talk. Pt given TPA and s/p thrombectomy on 4/20.  Was intubated and restrained as of 4/22,  extubated 4/28 and now drowsy.  L MCA revascularization with tPA, acute respiratory failure from possible Asp PNA, now referred to rehab.  PMHx:  DM, HTN,  On 5/2 pt with worsening of bleed to 80m, transferred to ICU and placed on 3% saline.   OT comments  Patient continues to make steady progress towards goals in skilled OT session. Patient's session encompassed co-treat with PT in order to address functional deficits with bed mobility, engagement of core musculature, following one-step commands, and increased participation in ADLs. Pt remains increasingly apraxic, and despite using native language and HOH demonstration, was unable to get pt to heed commands more than 25% of the time. Due to increased pushing towards R at edge of bed, bed level exercises were completed to maintain strength. Pt able to demonstrate bridging with BLEs, with min A, however required max A of 2 to roll and complete ROM with LUE (remains flaccid on R). Discharge remains appropriate; will continue to follow acutely.    Follow Up Recommendations  CIR;Supervision/Assistance - 24 hour    Equipment Recommendations  Other (comment)    Recommendations for Other Services      Precautions / Restrictions Precautions Precautions: Fall;Other (comment) Precaution Comments: cortrak Restrictions Weight Bearing Restrictions: No Other Position/Activity Restrictions: 30 deg HOB elevation       Mobility Bed  Mobility Overal bed mobility: Needs Assistance Bed Mobility: Rolling;Supine to Sit;Sit to Sidelying Rolling: Max assist;Mod assist   Supine to sit: +2 for physical assistance;Max assist   Sit to sidelying: +2 for physical assistance;Max assist General bed mobility comments: cues for sequencing, assist for all aspects of mobility. Pt able to roll right mod assist and left max assist.  Transfers                 General transfer comment: Attempted sit to stand +2 max assist without success. Pt unable to clear hips from bed.    Balance Overall balance assessment: Needs assistance Sitting-balance support: Single extremity supported;No upper extremity supported Sitting balance-Leahy Scale: Zero Sitting balance - Comments: max assist to maintain sitting balance EOB Postural control: Right lateral lean;Posterior lean     Standing balance comment: Attempted x1, unable to clear hips                           ADL either performed or assessed with clinical judgement   ADL Overall ADL's : Needs assistance/impaired                                     Functional mobility during ADLs: Maximal assistance;+2 for physical assistance;Total assistance General ADL Comments: Pt unable to follow commands to complete ADLs, despite using spanish prompts, due to increased pushing at EOB, bed level exercises completed     Vision       Perception     Praxis  Cognition Arousal/Alertness: Awake/alert Behavior During Therapy: Flat affect Overall Cognitive Status: Impaired/Different from baseline Area of Impairment: Problem solving;Awareness;Following commands;Attention;Orientation                 Orientation Level: Place;Time;Situation;Disoriented to Current Attention Level: Focused Memory: Decreased recall of precautions;Decreased short-term memory Following Commands: Follows one step commands inconsistently Safety/Judgement: Decreased awareness of  safety;Decreased awareness of deficits Awareness: Intellectual Problem Solving: Slow processing;Decreased initiation;Difficulty sequencing;Requires tactile cues;Requires verbal cues General Comments: Following simple commands approx 25% of trials. Difficult to discern if cognition or language barrier.        Exercises Other Exercises Other Exercises: bridging in supine x 10 reps. Assist for BLE to maintained flexed position with feet supported on bed. Pt able to initiate and complete with verbal cues. Other Exercises: PROM completed with LUE, no activation noted   Shoulder Instructions       General Comments Wife present during session.    Pertinent Vitals/ Pain       Pain Assessment: Faces Faces Pain Scale: No hurt  Home Living                                          Prior Functioning/Environment              Frequency  Min 2X/week        Progress Toward Goals  OT Goals(current goals can now be found in the care plan section)  Progress towards OT goals: Progressing toward goals  Acute Rehab OT Goals Patient Stated Goal: not stated OT Goal Formulation: With patient/family Time For Goal Achievement: 04/16/20 Potential to Achieve Goals: [place] Discharge plan remains appropriate    Co-evaluation    PT/OT/SLP Co-Evaluation/Treatment: Yes Reason for Co-Treatment: Complexity of the patient's impairments (multi-system involvement);Necessary to address cognition/behavior during functional activity;To address functional/ADL transfers;For patient/therapist safety PT goals addressed during session: Mobility/safety with mobility;Balance OT goals addressed during session: ADL's and self-care;Strengthening/ROM      AM-PAC OT "6 Clicks" Daily Activity     Outcome Measure   Help from another person eating meals?: Total Help from another person taking care of personal grooming?: A Lot Help from another person toileting, which includes using  toliet, bedpan, or urinal?: Total Help from another person bathing (including washing, rinsing, drying)?: Total Help from another person to put on and taking off regular upper body clothing?: Total Help from another person to put on and taking off regular lower body clothing?: Total 6 Click Score: 7    End of Session    OT Visit Diagnosis: Unsteadiness on feet (R26.81);Other abnormalities of gait and mobility (R26.89);Muscle weakness (generalized) (M62.81);Pain;Hemiplegia and hemiparesis Hemiplegia - Right/Left: Right Hemiplegia - dominant/non-dominant: Dominant Hemiplegia - caused by: Cerebral infarction   Activity Tolerance Patient limited by lethargy;Patient limited by fatigue   Patient Left in bed;with call bell/phone within reach;with bed alarm set;with family/visitor present   Nurse Communication Mobility status        Time: 4696-2952 OT Time Calculation (min): 23 min  Charges: OT General Charges $OT Visit: 1 Visit OT Treatments $Self Care/Home Management : 8-22 mins  Corinne Ports E. Veleria Barnhardt, COTA/L Acute Rehabilitation Services [place] 04/09/2020, 3:35 PM

## 2020-04-09 NOTE — Progress Notes (Signed)
Inpatient Rehab Admissions Coordinator:   Will reopen insurance for authorization today, since family prefers [facility] to other area facilities.  Estill Dooms, PT, DPT Admissions Coordinator [phone] 04/09/20  3:08 PM

## 2020-04-10 ENCOUNTER — Inpatient Hospital Stay (HOSPITAL_COMMUNITY): Payer: BC Managed Care – PPO

## 2020-04-10 LAB — GLUCOSE, CAPILLARY
Glucose-Capillary: 206 mg/dL — ABNORMAL HIGH (ref 70–99)
Glucose-Capillary: 208 mg/dL — ABNORMAL HIGH (ref 70–99)
Glucose-Capillary: 209 mg/dL — ABNORMAL HIGH (ref 70–99)
Glucose-Capillary: 215 mg/dL — ABNORMAL HIGH (ref 70–99)
Glucose-Capillary: 233 mg/dL — ABNORMAL HIGH (ref 70–99)
Glucose-Capillary: 252 mg/dL — ABNORMAL HIGH (ref 70–99)
Glucose-Capillary: 294 mg/dL — ABNORMAL HIGH (ref 70–99)

## 2020-04-10 LAB — CBC
HCT: 36.4 % — ABNORMAL LOW (ref 39.0–52.0)
Hemoglobin: 12.5 g/dL — ABNORMAL LOW (ref 13.0–17.0)
MCH: 30.9 pg (ref 26.0–34.0)
MCHC: 34.3 g/dL (ref 30.0–36.0)
MCV: 90.1 fL (ref 80.0–100.0)
Platelets: 305 10*3/uL (ref 150–400)
RBC: 4.04 MIL/uL — ABNORMAL LOW (ref 4.22–5.81)
RDW: 12.3 % (ref 11.5–15.5)
WBC: 7.6 10*3/uL (ref 4.0–10.5)
nRBC: 0 % (ref 0.0–0.2)

## 2020-04-10 LAB — BASIC METABOLIC PANEL
Anion gap: 9 (ref 5–15)
BUN: 21 mg/dL — ABNORMAL HIGH (ref 6–20)
CO2: 23 mmol/L (ref 22–32)
Calcium: 9 mg/dL (ref 8.9–10.3)
Chloride: 104 mmol/L (ref 98–111)
Creatinine, Ser: 0.74 mg/dL (ref 0.61–1.24)
GFR calc Af Amer: >60 mL/min (ref >60)
GFR calc non Af Amer: >60 mL/min (ref >60)
Glucose, Bld: 255 mg/dL — ABNORMAL HIGH (ref 70–99)
Potassium: 3.7 mmol/L (ref 3.5–5.1)
Sodium: 136 mmol/L (ref 135–145)

## 2020-04-10 MED ORDER — INSULIN ASPART 100 UNIT/ML ~~LOC~~ SOLN
4.0000 [IU] | Freq: Three times a day (TID) | SUBCUTANEOUS | Status: DC
  Administered 2020-04-10: 17:00:00 4 [IU] via SUBCUTANEOUS

## 2020-04-10 MED ORDER — INSULIN GLARGINE 100 UNIT/ML ~~LOC~~ SOLN
30.0000 [IU] | Freq: Two times a day (BID) | SUBCUTANEOUS | Status: DC
  Administered 2020-04-11 – 2020-04-13 (×5): 30 [IU] via SUBCUTANEOUS
  Filled 2020-04-10 (×6): qty 0.3

## 2020-04-10 MED ORDER — INSULIN ASPART 100 UNIT/ML ~~LOC~~ SOLN
5.0000 [IU] | Freq: Three times a day (TID) | SUBCUTANEOUS | Status: DC
  Administered 2020-04-11 – 2020-04-13 (×7): 5 [IU] via SUBCUTANEOUS

## 2020-04-10 MED ORDER — INSULIN GLARGINE 100 UNIT/ML ~~LOC~~ SOLN
26.0000 [IU] | Freq: Two times a day (BID) | SUBCUTANEOUS | Status: DC
  Administered 2020-04-10: 26 [IU] via SUBCUTANEOUS
  Filled 2020-04-10: qty 0.26

## 2020-04-10 NOTE — Progress Notes (Signed)
Inpatient Diabetes Program Recommendations  AACE/ADA: New Consensus Statement on Inpatient Glycemic Control (2015)  Target Ranges:  Prepandial:   less than 140 mg/dL      Peak postprandial:   less than 180 mg/dL (1-2 hours)      Critically ill patients:  140 - 180 mg/dL   Lab Results  Component Value Date   GLUCAP 209 (H) 04/10/2020   HGBA1C 9.2 (H) 03/24/2020    Review of Glycemic Control  Diabetes history: DM2 Outpatient Diabetes medications: Farxiga 10 mg QD, glipizide 5 mg bid, Actos 30 mg QD Current orders for Inpatient glycemic control: Lantus 25 units bid, Novolog 0-15 units Q4H + 3 units tidwc  HgbA1C - 9.2%  Inpatient Diabetes Program Recommendations:   Increase Lantus to 26 units bid Increase Novolog to 4 units tidwc for meal coverage insulin.  Continue to follow closely. Awaiting Rehab placement.  Thank you. Ailene Ards, RD, LDN, CDE Inpatient Diabetes Coordinator [phone]

## 2020-04-10 NOTE — Progress Notes (Signed)
Physical Therapy Treatment Patient Details Name: Brad Delacruz MRN: [id number] DOB: 08-29-1961 Today's Date: 04/10/2020    History of Present Illness 59 y.o. male past medical history of diabetes, hypertension which he does not take medication for, presented to the emergency room at [facility] with last known well of 5 AM when he was at work and was noted to have a sudden onset of right-sided weakness and inability to talk. Pt given TPA and s/p thrombectomy on 4/20.  Was intubated and restrained as of 4/22,  extubated 4/28 and now drowsy.  L MCA revascularization with tPA, acute respiratory failure from possible Asp PNA, now referred to rehab.  PMHx:  DM, HTN,  On 5/2 pt with worsening of bleed to 71m, transferred to ICU and placed on 3% saline.    PT Comments    Pt in bed upon arrival of PT, agreeable to PT session with focus on progressing strengthening and general LE exercises. The pt was able to demo improved command following for exercises today, and was able to complete multiple rolls in bed with heavy VC but only minA to complete the movement. The pt continues to demo significant limitations in strength, motor control, balance, and activity tolerance, and will therefore continue to benefit from skilled PT to maximize return to prior level of function.     Follow Up Recommendations  CIR;Supervision/Assistance - 24 hour     Equipment Recommendations  Wheelchair (measurements PT);Wheelchair cushion (measurements PT);Hospital bed    Recommendations for Other Services       Precautions / Restrictions Precautions Precautions: Fall;Other (comment) Precaution Comments: cortrak, needs interpreter (spanish) Restrictions Weight Bearing Restrictions: No Other Position/Activity Restrictions: 30 deg HOB elevation    Mobility  Bed Mobility Overal bed mobility: Needs Assistance Bed Mobility: Rolling;Supine to Sit;Sit to Sidelying Rolling: Mod assist         General bed  mobility comments: cues for sequencing, minA given at LE and UE to guide into position, pt able to roll with extra time and minA, did not attempt to sit up today due to concern for pt/PT safety  Transfers                    Ambulation/Gait             General Gait Details: unable   Stairs             Wheelchair Mobility    Modified Rankin (Stroke Patients Only) Modified Rankin (Stroke Patients Only) Pre-Morbid Rankin Score: No symptoms Modified Rankin: Severe disability     Balance Overall balance assessment: Needs assistance                                          Cognition Arousal/Alertness: Awake/alert Behavior During Therapy: Flat affect Overall Cognitive Status: Impaired/Different from baseline Area of Impairment: Problem solving;Awareness;Following commands;Attention;Orientation                 Orientation Level: Disoriented to;Place;Time;Situation Current Attention Level: Focused Memory: Decreased recall of precautions;Decreased short-term memory Following Commands: Follows one step commands inconsistently Safety/Judgement: Decreased awareness of safety;Decreased awareness of deficits Awareness: Intellectual Problem Solving: Slow processing;Decreased initiation;Difficulty sequencing;Requires tactile cues;Requires verbal cues General Comments: Pt with improved command following for exercises and rolling practice, but does not answer yes/no questions, even with use of interpreter      Exercises General Exercises - Lower  Extremity Quad Sets: AROM;Left;10 reps;Supine(with 5 sec hold) Heel Slides: AROM;10 reps;PROM;Both;Supine(PROM RLE, AROM LLE) Hip ABduction/ADduction: 10 reps;AROM;Left;Supine Other Exercises Other Exercises: PROM completed with LUE, no activation noted    General Comments General comments (skin integrity, edema, etc.): Wife present during session, interpreter: Freida Busman [phone]. Pt indicating abdominal  pain and RN notified.      Pertinent Vitals/Pain Pain Assessment: Faces Faces Pain Scale: Hurts little more Pain Location: pt indicating abdomen, RN notified, pain meds given Pain Descriptors / Indicators: Grimacing Pain Intervention(s): Limited activity within patient's tolerance;Repositioned;Patient requesting pain meds-RN notified    Home Living                      Prior Function            PT Goals (current goals can now be found in the care plan section) Acute Rehab PT Goals Patient Stated Goal: not stated PT Goal Formulation: With family Time For Goal Achievement: 04/23/20 Potential to Achieve Goals: Fair Progress towards PT goals: Progressing toward goals    Frequency    Min 4X/week      PT Plan Current plan remains appropriate    Co-evaluation              AM-PAC PT "6 Clicks" Mobility   Outcome Measure  Help needed turning from your back to your side while in a flat bed without using bedrails?: A Lot Help needed moving from lying on your back to sitting on the side of a flat bed without using bedrails?: Total Help needed moving to and from a bed to a chair (including a wheelchair)?: Total Help needed standing up from a chair using your arms (e.g., wheelchair or bedside chair)?: Total Help needed to walk in hospital room?: Total Help needed climbing 3-5 steps with a railing? : Total 6 Click Score: 7    End of Session   Activity Tolerance: Patient tolerated treatment well Patient left: with call bell/phone within reach;with bed alarm set;in bed;with family/visitor present Nurse Communication: Mobility status;Need for lift equipment PT Visit Diagnosis: Muscle weakness (generalized) (M62.81);Other symptoms and signs involving the nervous system (R29.898);Hemiplegia and hemiparesis Hemiplegia - Right/Left: Right Hemiplegia - dominant/non-dominant: Dominant Hemiplegia - caused by: Cerebral infarction     Time: 1696-7893 PT Time  Calculation (min) (ACUTE ONLY): 41 min  Charges:  $Therapeutic Exercise: 8-22 mins $Therapeutic Activity: 23-37 mins                     [name], PT, DPT   Acute Rehabilitation Department Pager #: [phone]   [name] 04/10/2020, 4:15 PM

## 2020-04-10 NOTE — H&P (Signed)
Physical Medicine and Rehabilitation Admission H&P    Chief Complaint  Patient presents with  . Code Stroke  : HPI: Brad Delacruz is a 59 year old right-handed male history of diabetes mellitus and hypertension.  Per chart review lives with spouse independent prior to admission.  Presented 03/24/2020 with right-sided weakness and aphasia to [facility].  Admission chemistries with alcohol negative, sodium 130, potassium 3.3, glucose 309, hemoglobin A1c 9.2, urine drug screen positive benzos.  Cranial CT scan negative for acute changes.  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  CT cerebral perfusion scan as well as CT angiogram of head and neck showed a large left MCA territory nonhemorrhagic infarction as well as acute left M1 large vessel occlusion.  Large penumbra with mismatch volume of 99 mL.  Patient was transferred to [facility].  Patient underwent TPA followed by mechanical thrombectomy revascularization per interventional radiology.  Echocardiogram with ejection fraction of 60% grade 1 diastolic dysfunction.  TCD bubble study was positive for small PFO and lower extremity Dopplers negative for DVT.  Patient remained intubated through 04/01/2020.    Latest follow-up cranial CT scan 04/08/2020 shows evolving large left MCA territory infarct with similar mass-effect no new hemorrhage or hydrocephalus.  Maintained on low-dose aspirin as well as Brilinta for CVA prophylaxis.  Considerations to be made for TEE and loop recorder as outpatient.  Subcutaneous heparin for DVT prophylaxis.  Nasogastric tube placed for nutritional support changed to nocturnal and diet currently advanced to dysphagia #1 honey thick liquid and nasogastric tube has been removed.  He did complete a course of Ancef 03/31/2020 to 04/05/2020 for pneumonia.  Urine culture 04/14/2020 multiple species intravenous Rocephin discontinued.  Therapy evaluations completed and patient was admitted for a  comprehensive rehab program.  Review of Systems  Constitutional: Positive for fever.  HENT: Negative for hearing loss.   Eyes: Negative for blurred vision and double vision.  Respiratory: Negative for cough and shortness of breath.   Cardiovascular: Negative for chest pain, palpitations and leg swelling.  Gastrointestinal: Positive for constipation. Negative for heartburn, nausea and vomiting.  Genitourinary: Negative for dysuria, flank pain and hematuria.  Musculoskeletal: Positive for myalgias.  Skin: Negative for rash.  Neurological: Positive for speech change, weakness and headaches.  All other systems reviewed and are negative.  Past Medical History:  Diagnosis Date  . Diabetes mellitus without complication [facility])    Past Surgical History:  Procedure Laterality Date  . IR CT HEAD LTD  03/24/2020  . IR CT HEAD LTD  03/24/2020  . IR INTRA CRAN STENT  03/24/2020  . IR PATIENT EVAL TECH 0-60 MINS  03/25/2020  . IR PERCUTANEOUS ART THROMBECTOMY/INFUSION INTRACRANIAL INC DIAG ANGIO  03/24/2020  . RADIOLOGY WITH ANESTHESIA N/A 03/24/2020   Procedure: IR WITH ANESTHESIA;  Surgeon: Julieanne Cotton, MD;  Location: [facility] OR;  Service: Radiology;  Laterality: N/A;   History reviewed. No pertinent family history. Social History:  reports that he has never smoked. He has never used smokeless tobacco. He reports previous alcohol use. He reports that he does not use drugs. Allergies: No Known Allergies Medications Prior to Admission  Medication Sig Dispense Refill  . ciclopirox (PENLAC) 8 % solution Apply topically as directed.    . dapagliflozin propanediol (FARXIGA) 10 MG TABS tablet Take 10 mg by mouth daily.    Marland Kitchen glipiZIDE (GLUCOTROL XL) 5 MG 24 hr tablet Take 5 mg by mouth 2 (two) times daily.    Marland Kitchen  losartan (COZAAR) 25 MG tablet Take 25 mg by mouth daily.    . meloxicam (MOBIC) 15 MG tablet Take 15 mg by mouth daily.    . pioglitazone (ACTOS) 30 MG tablet Take 30 mg by mouth daily.    .  rosuvastatin (CRESTOR) 5 MG tablet Take 5 mg by mouth daily.    Marland Kitchen XIGDUO XR 09-999 MG TB24 Take 1 tablet by mouth daily.      Drug Regimen Review Drug regimen was reviewed and remains appropriate with no significant issues identified  Home: Home Living Family/patient expects to be discharged to:: Private residence Living Arrangements: Spouse/significant other, Children Available Help at Discharge: Family, Available 24 hours/day Type of Home: House Home Access: Stairs to enter [employer] of Steps: 2-3 back steps Entrance Stairs-Rails: None Home Layout: One level Bathroom Shower/Tub: Engineer, manufacturing systems: Standard Home Equipment: Crutches Additional Comments: information of home from chart as family is gone and pt not very alert  Lives With: Family   Functional History: Prior Function Level of Independence: Independent Comments: maintenance job  Functional Status:  Mobility: Bed Mobility Overal bed mobility: Needs Assistance Bed Mobility: Rolling, Supine to Sit, Sit to Sidelying Rolling: Max assist, Mod assist Sidelying to sit: Total assist, +2 for physical assistance Supine to sit: +2 for physical assistance, Max assist Sit to supine: Max assist, +2 for physical assistance Sit to sidelying: +2 for physical assistance, Max assist General bed mobility comments: cues for sequencing, assist for all aspects of mobility. Pt able to roll right mod assist and left max assist. Transfers Overall transfer level: Needs assistance Equipment used: 2 person hand held assist Transfers: Sit to/from Stand Sit to Stand: +2 physical assistance, Max assist General transfer comment: Attempted sit to stand +2 max assist without success. Pt unable to clear hips from bed. Ambulation/Gait General Gait Details: unable    ADL: ADL Overall ADL's : Needs assistance/impaired Eating/Feeding: NPO Grooming: Wash/dry face, Minimal assistance Grooming Details (indicate cue  type and reason): pt given cues then cloth brought to face adn pt wiped out his eyes Lower Body Dressing: Total assistance Toileting- Clothing Manipulation and Hygiene: Total assistance Toileting - Clothing Manipulation Details (indicate cue type and reason): Pt standing and indicating through gestures, increased restlessness that he needed something prior to his catheter busting and urine pouring on floor Functional mobility during ADLs: Maximal assistance, +2 for physical assistance, Total assistance General ADL Comments: Pt unable to follow commands to complete ADLs, despite using spanish prompts, due to increased pushing at EOB, bed level exercises completed  Cognition: Cognition Overall Cognitive Status: Impaired/Different from baseline Arousal/Alertness: Lethargic Orientation Level: (response to voice) Attention: Sustained Sustained Attention: Impaired Sustained Attention Impairment: Functional basic Cognition Arousal/Alertness: Awake/alert Behavior During Therapy: Flat affect Overall Cognitive Status: Impaired/Different from baseline Area of Impairment: Problem solving, Awareness, Following commands, Attention, Orientation Orientation Level: Place, Time, Situation, Disoriented to Current Attention Level: Focused Memory: Decreased recall of precautions, Decreased short-term memory Following Commands: Follows one step commands inconsistently Safety/Judgement: Decreased awareness of safety, Decreased awareness of deficits Awareness: Intellectual Problem Solving: Slow processing, Decreased initiation, Difficulty sequencing, Requires tactile cues, Requires verbal cues General Comments: Following simple commands approx 25% of trials. Difficult to discern if cognition or language barrier. Difficult to assess due to: Non-English speaking, Impaired communication  Physical Exam: Blood pressure 139/76, pulse 71, temperature 98 F (36.7 C), temperature source Axillary, resp. rate 18,  height 5\' 5"  (1.651 m), weight 80 kg, SpO2 98 %. General: NAD, sitting up  in bed HEENT: Head is normocephalic, atraumatic, sclera anicteric, oral mucosa pink and moist, dentition intact, ext ear canals clear, left gaze deviation Neck: Supple without JVD or lymphadenopathy Heart: Reg rate and rhythm. No murmurs rubs or gallops Chest: CTA bilaterally without wheezes, rales, or rhonchi; no distress Abdomen: Soft, non-tender, non-distended, bowel sounds positive. Extremities: No clubbing, cyanosis, or edema. Pulses are 2+ Skin: Clean and intact without signs of breakdown Neuro/Musculoskeletal: Global aphasia expressive worse than receptive. Follows simple commands. Mild right sided facial droop. Right sided neglect. LUE: 4/5, LLE 3/5, Right side 0/5 throughout.  Psych: Pt's affect is appropriate. Pt is cooperative  Results for orders placed or performed during the hospital encounter of 03/24/20 (from the past 48 hour(s))  Basic metabolic panel     Status: Abnormal   Collection Time: 04/08/20  7:26 AM  Result Value Ref Range   Sodium 144 135 - 145 mmol/L   Potassium 3.5 3.5 - 5.1 mmol/L   Chloride 115 (H) 98 - 111 mmol/L   CO2 23 22 - 32 mmol/L   Glucose, Bld 181 (H) 70 - 99 mg/dL    Comment: Glucose reference range applies only to samples taken after fasting for at least 8 hours.   BUN 21 (H) 6 - 20 mg/dL   Creatinine, Ser 1.61 0.61 - 1.24 mg/dL   Calcium 9.2 8.9 - 09.6 mg/dL   GFR calc non Af Amer >60 >60 mL/min   GFR calc Af Amer >60 >60 mL/min   Anion gap 6 5 - 15    Comment: Performed at [facility], [address], [place], [place] [address]  CBC     Status: Abnormal   Collection Time: 04/08/20  7:26 AM  Result Value Ref Range   WBC 9.3 4.0 - 10.5 K/uL   RBC 4.03 (L) 4.22 - 5.81 MIL/uL   Hemoglobin 12.5 (L) 13.0 - 17.0 g/dL   HCT 98.1 (L) 19.1 - 47.8 %   MCV 91.8 80.0 - 100.0 fL   MCH 31.0 26.0 - 34.0 pg   MCHC 33.8 30.0 - 36.0 g/dL   RDW 29.5 62.1 - 30.8 %    Platelets 300 150 - 400 K/uL   nRBC 0.0 0.0 - 0.2 %    Comment: Performed at [facility], [address], [place], [place] [address]  Glucose, capillary     Status: Abnormal   Collection Time: 04/08/20  7:51 AM  Result Value Ref Range   Glucose-Capillary 179 (H) 70 - 99 mg/dL    Comment: Glucose reference range applies only to samples taken after fasting for at least 8 hours.   Comment 1 Notify RN    Comment 2 Document in Chart   Glucose, capillary     Status: Abnormal   Collection Time: 04/08/20 11:23 AM  Result Value Ref Range   Glucose-Capillary 178 (H) 70 - 99 mg/dL    Comment: Glucose reference range applies only to samples taken after fasting for at least 8 hours.  Glucose, capillary     Status: Abnormal   Collection Time: 04/08/20  4:38 PM  Result Value Ref Range   Glucose-Capillary 160 (H) 70 - 99 mg/dL    Comment: Glucose reference range applies only to samples taken after fasting for at least 8 hours.  Glucose, capillary     Status: Abnormal   Collection Time: 04/08/20  7:45 PM  Result Value Ref Range   Glucose-Capillary 121 (H) 70 - 99 mg/dL  Comment: Glucose reference range applies only to samples taken after fasting for at least 8 hours.  Glucose, capillary     Status: Abnormal   Collection Time: 04/08/20 11:52 PM  Result Value Ref Range   Glucose-Capillary 145 (H) 70 - 99 mg/dL    Comment: Glucose reference range applies only to samples taken after fasting for at least 8 hours.  Glucose, capillary     Status: Abnormal   Collection Time: 04/09/20  3:48 AM  Result Value Ref Range   Glucose-Capillary 112 (H) 70 - 99 mg/dL    Comment: Glucose reference range applies only to samples taken after fasting for at least 8 hours.  Basic metabolic panel     Status: Abnormal   Collection Time: 04/09/20  4:10 AM  Result Value Ref Range   Sodium 138 135 - 145 mmol/L   Potassium 3.5 3.5 - 5.1 mmol/L   Chloride 109 98 - 111 mmol/L   CO2 24 22 - 32 mmol/L   Glucose, Bld  130 (H) 70 - 99 mg/dL    Comment: Glucose reference range applies only to samples taken after fasting for at least 8 hours.   BUN 23 (H) 6 - 20 mg/dL   Creatinine, Ser 0.72 0.61 - 1.24 mg/dL   Calcium 8.9 8.9 - 10.3 mg/dL   GFR calc non Af Amer >60 >60 mL/min   GFR calc Af Amer >60 >60 mL/min   Anion gap 5 5 - 15    Comment: Performed at [facility] [address], [place], [place] [address]  CBC     Status: Abnormal   Collection Time: 04/09/20  4:10 AM  Result Value Ref Range   WBC 9.3 4.0 - 10.5 K/uL   RBC 3.91 (L) 4.22 - 5.81 MIL/uL   Hemoglobin 12.1 (L) 13.0 - 17.0 g/dL   HCT 35.8 (L) 39.0 - 52.0 %   MCV 91.6 80.0 - 100.0 fL   MCH 30.9 26.0 - 34.0 pg   MCHC 33.8 30.0 - 36.0 g/dL   RDW 12.5 11.5 - 15.5 %   Platelets 315 150 - 400 K/uL   nRBC 0.0 0.0 - 0.2 %    Comment: Performed at [facility], [address], [place], [place] [address]  Glucose, capillary     Status: Abnormal   Collection Time: 04/09/20  7:33 AM  Result Value Ref Range   Glucose-Capillary 185 (H) 70 - 99 mg/dL    Comment: Glucose reference range applies only to samples taken after fasting for at least 8 hours.  Glucose, capillary     Status: Abnormal   Collection Time: 04/09/20 11:37 AM  Result Value Ref Range   Glucose-Capillary 200 (H) 70 - 99 mg/dL    Comment: Glucose reference range applies only to samples taken after fasting for at least 8 hours.  Glucose, capillary     Status: Abnormal   Collection Time: 04/09/20  3:43 PM  Result Value Ref Range   Glucose-Capillary 190 (H) 70 - 99 mg/dL    Comment: Glucose reference range applies only to samples taken after fasting for at least 8 hours.  Glucose, capillary     Status: Abnormal   Collection Time: 04/09/20  8:23 PM  Result Value Ref Range   Glucose-Capillary 233 (H) 70 - 99 mg/dL    Comment: Glucose reference range applies only to samples taken after fasting for at least 8 hours.  Glucose, capillary     Status: Abnormal  Collection  Time: 04/10/20 12:03 AM  Result Value Ref Range   Glucose-Capillary 208 (H) 70 - 99 mg/dL    Comment: Glucose reference range applies only to samples taken after fasting for at least 8 hours.  Glucose, capillary     Status: Abnormal   Collection Time: 04/10/20  4:38 AM  Result Value Ref Range   Glucose-Capillary 215 (H) 70 - 99 mg/dL    Comment: Glucose reference range applies only to samples taken after fasting for at least 8 hours.   DG Swallowing Func-Speech Pathology  Result Date: 04/08/2020 Objective Swallowing Evaluation: Type of Study: MBS-Modified Barium Swallow Study  Patient Details Name: Brad Delacruz MRN: [id number] Date of Birth: 05-Feb-1961 Today's Date: 04/08/2020 Time: SLP Start Time (ACUTE ONLY): 1250 -SLP Stop Time (ACUTE ONLY): 1322 SLP Time Calculation (min) (ACUTE ONLY): 32 min Past Medical History: Past Medical History: Diagnosis Date . Diabetes mellitus without complication [facility])  Past Surgical History: Past Surgical History: Procedure Laterality Date . IR CT HEAD LTD  03/24/2020 . IR CT HEAD LTD  03/24/2020 . IR INTRA CRAN STENT  03/24/2020 . IR PATIENT EVAL TECH 0-60 MINS  03/25/2020 . IR PERCUTANEOUS ART THROMBECTOMY/INFUSION INTRACRANIAL INC DIAG ANGIO  03/24/2020 . RADIOLOGY WITH ANESTHESIA N/A 03/24/2020  Procedure: IR WITH ANESTHESIA;  Surgeon: Julieanne Cotton, MD;  Location: [facility] OR;  Service: Radiology;  Laterality: N/A; HPI: 59 y.o. male past medical history of diabetes, hypertension which he does not take medication for, presented to the emergency room at [facility] with last known well of 5 AM when he was at work and was noted to have a sudden onset of right-sided weakness and inability to talk. Pt given TPA and s/p thrombectomy on 4/20. Pt remains intubated and restrained as of 4/22.  PT was intubated from 4/20-4/22.  Head CT reported a large left MCA infarct.   Subjective: pt alert, cooperative Assessment / Plan / Recommendation CHL IP CLINICAL IMPRESSIONS 04/08/2020  Clinical Impression Pt presents wtih a moderate oropharyngeal dysphagia with delays in oral transit and pharyngeal swallow initiation. His daughter, Verlee Monte, said that his time for oral transit is not any longer than it is at his baseline, but during MBS he was not able to masticate even a small piece of graham cracker. The whole piece spilled anteriorly from his mouth. He has lingual pumping and reduced organization. At times his pyriform sinuses become more full before the swallow as he continues to clear his oral cavity. When this happens with nectar thick liquids, he penetrates and aspirates during the swallow as they spill into his airway. He did have an episode of aspiration before the swallow when they spilled directly into the airway when he had his head in a posterior tilt. Pt does not immediately cough in response and he does not cough on command, but a delayed cough was suspicious for delayed sensation. He had good airway protection and pharyngeal clearance with purees. Intermittent penetration occurred with honey thick liquids but was trace and cleared with subsequent swallows. Recommend starting Dys 1 diet and honey thick liquids by spoon with full supervision. He has potential for diet advancement if he can improve his oral control and/or follow commands more consistently for use of compensatory strategies.  SLP Visit Diagnosis Dysphagia, oropharyngeal phase (R13.12) Attention and concentration deficit following -- Frontal lobe and executive function deficit following -- Impact on safety and function Moderate aspiration risk   CHL IP TREATMENT RECOMMENDATION 04/08/2020 Treatment Recommendations Therapy as outlined in treatment  plan below   Prognosis 04/08/2020 Prognosis for Safe Diet Advancement Good Barriers to Reach Goals Language deficits Barriers/Prognosis Comment -- CHL IP DIET RECOMMENDATION 04/08/2020 SLP Diet Recommendations Dysphagia 1 (Puree) solids;Honey thick liquids Liquid Administration via Spoon  Medication Administration Crushed with puree Compensations Slow rate;Small sips/bites Postural Changes Seated upright at 90 degrees   CHL IP OTHER RECOMMENDATIONS 04/08/2020 Recommended Consults -- Oral Care Recommendations Oral care BID Other Recommendations Have oral suction available   CHL IP FOLLOW UP RECOMMENDATIONS 04/08/2020 Follow up Recommendations Inpatient Rehab   CHL IP FREQUENCY AND DURATION 04/08/2020 Speech Therapy Frequency (ACUTE ONLY) min 2x/week Treatment Duration 2 weeks      CHL IP ORAL PHASE 04/08/2020 Oral Phase Impaired Oral - Pudding Teaspoon -- Oral - Pudding Cup -- Oral - Honey Teaspoon Weak lingual manipulation;Lingual pumping;Reduced posterior propulsion;Holding of bolus;Delayed oral transit;Decreased bolus cohesion;Premature spillage Oral - Honey Cup Weak lingual manipulation;Lingual pumping;Reduced posterior propulsion;Holding of bolus;Delayed oral transit;Decreased bolus cohesion;Premature spillage Oral - Nectar Teaspoon Weak lingual manipulation;Lingual pumping;Reduced posterior propulsion;Holding of bolus;Delayed oral transit;Decreased bolus cohesion;Premature spillage Oral - Nectar Cup Weak lingual manipulation;Lingual pumping;Reduced posterior propulsion;Holding of bolus;Delayed oral transit;Decreased bolus cohesion;Premature spillage Oral - Nectar Straw -- Oral - Thin Teaspoon -- Oral - Thin Cup -- Oral - Thin Straw -- Oral - Puree Weak lingual manipulation;Lingual pumping;Reduced posterior propulsion;Holding of bolus;Delayed oral transit;Decreased bolus cohesion;Premature spillage Oral - Mech Soft Impaired mastication;Right anterior bolus loss Oral - Regular -- Oral - Multi-Consistency -- Oral - Pill -- Oral Phase - Comment --  CHL IP PHARYNGEAL PHASE 04/08/2020 Pharyngeal Phase Impaired Pharyngeal- Pudding Teaspoon -- Pharyngeal -- Pharyngeal- Pudding Cup -- Pharyngeal -- Pharyngeal- Honey Teaspoon Delayed swallow initiation-vallecula;Penetration/Aspiration before swallow Pharyngeal  Material enters airway, remains ABOVE vocal cords then ejected out Pharyngeal- Honey Cup Delayed swallow initiation-vallecula;Delayed swallow initiation-pyriform sinuses;Penetration/Aspiration before swallow Pharyngeal Material enters airway, remains ABOVE vocal cords then ejected out Pharyngeal- Nectar Teaspoon Delayed swallow initiation-pyriform sinuses;Penetration/Aspiration before swallow;Penetration/Aspiration during swallow Pharyngeal Material enters airway, passes BELOW cords without attempt by patient to eject out (silent aspiration) Pharyngeal- Nectar Cup Delayed swallow initiation-pyriform sinuses;Penetration/Aspiration during swallow;Penetration/Aspiration before swallow Pharyngeal Material enters airway, passes BELOW cords without attempt by patient to eject out (silent aspiration) Pharyngeal- Nectar Straw -- Pharyngeal -- Pharyngeal- Thin Teaspoon -- Pharyngeal -- Pharyngeal- Thin Cup -- Pharyngeal -- Pharyngeal- Thin Straw -- Pharyngeal -- Pharyngeal- Puree Delayed swallow initiation-vallecula Pharyngeal -- Pharyngeal- Mechanical Soft -- Pharyngeal -- Pharyngeal- Regular -- Pharyngeal -- Pharyngeal- Multi-consistency -- Pharyngeal -- Pharyngeal- Pill -- Pharyngeal -- Pharyngeal Comment --  CHL IP CERVICAL ESOPHAGEAL PHASE 04/08/2020 Cervical Esophageal Phase WFL Pudding Teaspoon -- Pudding Cup -- Honey Teaspoon -- Honey Cup -- Nectar Teaspoon -- Nectar Cup -- Nectar Straw -- Thin Teaspoon -- Thin Cup -- Thin Straw -- Puree -- Mechanical Soft -- Regular -- Multi-consistency -- Pill -- Cervical Esophageal Comment -- Mahala Menghini., M.A. CCC-SLP Acute Rehabilitation Services Pager [phone] Office [phone] 04/08/2020, 2:38 PM                  Medical Problem List and Plan: 1.  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  TEE and loop recorder placement to be discussed as outpatient.  -patient  may shower  -ELOS/Goals: 20-22 days [facility] PT, OT, SLP 2.  Antithrombotics: -DVT/anticoagulation: Subcutaneous heparin.  Venous Doppler studies negative  -antiplatelet therapy: Brilinta 90 mg twice daily, aspirin 81 mg  daily 3. Pain Management: Tylenol as needed. Well controlled.  4. Mood: Amantadine 100 mg twice daily  -antipsychotic agents: N/A 5. Neuropsych: This patient is not capable of making decisions on his own behalf. 6. Skin/Wound Care: Routine skin checks 7. Fluids/Electrolytes/Nutrition: Routine in and outs with follow-up chemistries 8.  Dysphagia.  Dysphagia #1 honey thick liquids.   Dietary follow-up as well as speech therapy 9.  Diabetes mellitus.  Hemoglobin A1c 9.2.  NovoLog 5 units 2 times daily, Lantus insulin 35 units twice daily. Uncontrolled. Increase Lantus to 36U.  10.  Hypertension.  Cozaar 50 mg daily. Well controlled 11.  Hyperlipidemia.  Lipitor 12.  Obesity.  BMI 29.50.  Dietary follow-up  Charlton AmorDaniel J [name], PA-C 04/10/2020   I have personally performed a face to face diagnostic evaluation, including, but not limited to relevant history and physical exam findings, of this patient and developed relevant assessment and plan.  Additionally, I have reviewed and concur with the physician assistant's documentation above.  Sula SodaKrutika Ilissa Rosner, MD

## 2020-04-10 NOTE — Progress Notes (Signed)
STROKE TEAM PROGRESS NOTE   INTERVAL HISTORY Wife at bedside. Pt daughter also on the phone. Pt neuro stable, no significant change. As per RN, pt ate well. Calorie count pending. As per PT/OT, pt seems to hold his stomach with grimace while working with PT/OT. Wife said pt had rib fracture in the past. Will do CXR and KUB.   Vitals:   04/10/20 0002 04/10/20 0437 04/10/20 0500 04/10/20 0754  BP: 123/78 139/76  99/90  Pulse: 74 71  77  Resp: 16 18  18   Temp: 98 F (36.7 C) 98 F (36.7 C)  98.2 F (36.8 C)  TempSrc: Oral Axillary  Oral  SpO2: 99% 98%  100%  Weight:   80 kg   Height:        CBC:  Recent Labs  Lab 04/09/20 0410 04/10/20 0539  WBC 9.3 7.6  HGB 12.1* 12.5*  HCT 35.8* 36.4*  MCV 91.6 90.1  PLT 315 305    Basic Metabolic Panel:  Recent Labs  Lab 04/09/20 0410 04/10/20 0539  NA 138 136  K 3.5 3.7  CL 109 104  CO2 24 23  GLUCOSE 130* 255*  BUN 23* 21*  CREATININE 0.72 0.74  CALCIUM 8.9 9.0   IMAGING past 24 hours No results found.   PHYSICAL EXAM  Temp:  [98 F (36.7 C)-98.2 F (36.8 C)] 98.2 F (36.8 C) (05/07 0754) Pulse Rate:  [71-99] 77 (05/07 0754) Resp:  [16-20] 18 (05/07 0754) BP: (99-139)/(62-90) 99/90 (05/07 0754) SpO2:  [97 %-100 %] 100 % (05/07 0754) Weight:  [80 kg] 80 kg (05/07 0500)  General - Well nourished, well developed, not in acute distress  Ophthalmologic - fundi not visualized due to noncooperation.  Cardiovascular - Regular rhythm and rate.  Abdomen - no tenderness on palpitation of abdomen or lower chest, rib  Neuro - eyes open but global aphasia, did not follow commands.  Eyes left gaze deviation able to cross midline slightly, not blinking to visual threat on the right, blinking to threat on the left,  able to track on the left but not right. Mild lower right facial droop.  Tongue protrusion not cooperative. Spontaneous movement of LUE and LLE, LUE at least 4/5, no drift when lifted in air. LLE 3/5 with pain  stimulation. RUE flaccid, right-sided neglect, RLE slight withdraw to pain. DTR 1+. Right upgoing toe, left downgoing toe. Sensation, coordination and gait not tested.   ASSESSMENT/PLAN Brad Delacruz is a 59 y.o. male with history of HTN and DB noncompliant w/ medications presenting to [facility] with R sided weakness and inability to talk. NIH 28. CTA showed L M1 occlusion. Transferred to [facility]. [facility] On arrival, received IV tPA 03/24/2020 at 0725 followed by mechanical thrombectomy w/ TICI2C revascularization and rescue stent placement. Admitted to the ICU. transititioned out with transfer back to neuro ICU 5/1 with worsening edema for hypertonic saline protocol.    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 Old L corona radiata and internal capsule infarcts. ASPECTS 10.     CTA head & neck large L MCA territory infarct. L M1 LVO. Collateral vessels in sylvian fissure. B ICA bifurcation atherosclerosis. Aortic atherosclerosis.   CT perfusion large L MCA penumbra.   Cerebral  angio L MCA M1 occlusion w/ TICI2c revascularization x 6 passes. Rescue stenting for reocclusion   CT head 4/21 - evolving large left MCA infarct, trace midline shift, persistent high density and left C1 fissure, contrast versus blood  MRI  Large L MCA infarct evolution w/ edema and mass effect w/ 40mm L midline shift. SAH L sylvian fissure, L cerebral cortical sulci and basilar cisterns.  MRA  L M1 stent w/o flow c/w reocclusion. L ICA filling defect c/w small thrombus. VBJ w/ infundibulum vs aneurysm.  CT head 4/23 continued evolution large L MCA infarct w/ increased edema, now w/ 5-73mm midline shift. Stable L SAH w/ trace blood in tentorium.    CT head 4/24 - stable 5 mm shift. Large Lt  MCA infarct. Subarachnoid blood and/or contrast in and around the left Sylvian fissure.   CT Head 03/29/2020 - Sequelae of large Left MCA territory infarct remain stable since 03/27/2020. Rightward midline shift remains 5 mm.  CT Head 04/04/2020 - Evolving large left MCA territory infarction with probable petechial hemorrhage. Progression of edema and mass effect with midline shift now measuring 9 mm and probable early trapping of the right lateral ventricle.  CT Head 04/06/2020 - unchanged, still w/ 36mm midline shift    CT Head 04/08/2020 - evolving L MCA infarct w/ similar mass effect   2D Echo EF 55-60%. No source of embolus   LE doppler neg DVT  May consider TEE and loop recorder at outpt with neuro follow up if further neuro improvement   TCD bubble study positive for clinically insignificant PFO     LDL 55.1  TG 798->675->269 (on 4/22)   HgbA1c 9.2  SCDs for VTE prophylaxis  No antithrombotic prior to admission, now on aspirin 81 mg daily and Brilinta (ticagrelor) 90 mg bid given stent placement and L tICA thrombus.   Therapy recommendations: CIR - admission coordinator working on insurance approval  Disposition:  pending   Medically ready for d/c to rehab once bed found  Cerebral edema Induced Hypernatremia, resolved  CT showed large left MCA infarct with minimal midline shift  MRI  Large L MCA infarct evolution w/ edema and mass effect w/ 42mm L midline shift.   CT head 4/23 continued evolution large L MCA infarct w/ increased edema, now w/ 5-70mm midline shift. Stable L SAH w/ trace blood in tentorium.   CT head 4/25 stable 6mm MLS  CT Head 04/04/2020 - Evolving large left MCA territory infarction with probable petechial hemorrhage. Progression of edema and mass effect with midline shift now measuring 9 mm and probable early trapping of the right lateral ventricle.  CT Head 04/06/2020 - unchanged, still w/ 32mm midline shift    CT Head 04/08/2020 - evolving L MCA infarct w/  similar mass effect  3% saline off  Na 136  Acute Respiratory Failure, resolved PNA, resolved  Intubated for IR, left intubated post IR for airway protection secondary to acute L MCA stroke  Extubation 4/22  Tolerating well so far  Temp - afebrile ; WBCs - 9.3  Ancef 4/27>>5/2 for pneumonia  Hypertensive Emergency  SBP > 210 on arrival  Home meds:  Non-compliant - med rec lists:  losartan 25   Cleviprex changed to cardene d/t elevated TG -> now off  Cozaar 50 daily . Hydralazine PRN . Long-term BP goal normotensive  Hyperlipidemia  Home meds:  Non-compliant -  pravachol 10, crestor 5  LDL 55.1, goal < 70  On lipitor 80  Continue  statin at discharge  Diabetes type II Uncontrolled  Home meds:  Noncompliant - med rec lists:  dapagliflozin 10, glipizide 5, actos 30, dapagliflozin 10 / metformin 1000  HgbA1c 9.2, goal < 7.0  lantus 30 bid, novolog 5 u tid   CBGs  SSI 0-15  DM coordinator on board  Dysphagia . Secondary to stroke . Now on dys 1 and honey thick liquid . Put on nocturnal feeding . Has Cortrak . Speech on board . Dietitian consult for calore count . Encourage po intake . Free water added - 100 q6h   Other Stroke Risk Factors  Hx ETOH use, alcohol level <10, will advise family that he should drink no more than 2 drink(s) a day  Substance abuse - UDS:  Benzos POSITIVE  Overweight, Body mass index is 29.35 kg/m., recommend weight loss, diet and exercise as appropriate   Other Active Problems  Code status - Full code  Thrombocytopenia PLT - resolved  Hypokalemia K - resolved  Urinary retention - I&O cath, foley prn  D/c questran  pt seems to hold his stomach with grimace while working with PT/OT. Wife said pt had rib fracture in the past. CXR and KUB unremarkable.   Hospital day # 17    Marvel Plan, MD PhD Stroke Neurology 04/10/2020 3:19 PM    To contact Stroke Continuity provider, please refer to [email or web address]. After  hours, contact General Neurology

## 2020-04-10 NOTE — Progress Notes (Signed)
Inpatient Rehab Admissions Coordinator:   Met with pt and his wife at bedside (PT working with pt and stratus interpreter utilized for update).  I'm awaiting determination from pt's insurance on CIR authorization.  I have no beds available through the weekend for this patient so will f/u on Monday.   Shann Medal, PT, DPT Admissions Coordinator [phone] 04/10/20  2:11 PM

## 2020-04-10 NOTE — Progress Notes (Signed)
Brief Nutrition Note  Calorie count ordered by MD. RD will follow-up on Monday with results.   Instructions: Please hang calorie count envelope on the patient's door. Document percent consumed for each item on the patient's meal tray ticket and keep in envelope. Also document percent of any supplement or snack pt consumes and keep documentation in envelope for RD to review.   Eugene Gavia, MS, RD, LDN RD pager number and weekend/on-call pager number located in [place].

## 2020-04-11 DIAGNOSIS — I639 Cerebral infarction, unspecified: Secondary | ICD-10-CM | POA: Diagnosis not present

## 2020-04-11 LAB — BASIC METABOLIC PANEL
Anion gap: 12 (ref 5–15)
BUN: 22 mg/dL — ABNORMAL HIGH (ref 6–20)
CO2: 26 mmol/L (ref 22–32)
Calcium: 9 mg/dL (ref 8.9–10.3)
Chloride: 100 mmol/L (ref 98–111)
Creatinine, Ser: 0.74 mg/dL (ref 0.61–1.24)
GFR calc Af Amer: >60 mL/min (ref >60)
GFR calc non Af Amer: >60 mL/min (ref >60)
Glucose, Bld: 215 mg/dL — ABNORMAL HIGH (ref 70–99)
Potassium: 3.7 mmol/L (ref 3.5–5.1)
Sodium: 138 mmol/L (ref 135–145)

## 2020-04-11 LAB — CBC
HCT: 34.9 % — ABNORMAL LOW (ref 39.0–52.0)
Hemoglobin: 12.1 g/dL — ABNORMAL LOW (ref 13.0–17.0)
MCH: 31.3 pg (ref 26.0–34.0)
MCHC: 34.7 g/dL (ref 30.0–36.0)
MCV: 90.4 fL (ref 80.0–100.0)
Platelets: 304 10*3/uL (ref 150–400)
RBC: 3.86 MIL/uL — ABNORMAL LOW (ref 4.22–5.81)
RDW: 12.3 % (ref 11.5–15.5)
WBC: 7.8 10*3/uL (ref 4.0–10.5)
nRBC: 0 % (ref 0.0–0.2)

## 2020-04-11 LAB — GLUCOSE, CAPILLARY
Glucose-Capillary: 226 mg/dL — ABNORMAL HIGH (ref 70–99)
Glucose-Capillary: 177 mg/dL — ABNORMAL HIGH (ref 70–99)
Glucose-Capillary: 181 mg/dL — ABNORMAL HIGH (ref 70–99)
Glucose-Capillary: 259 mg/dL — ABNORMAL HIGH (ref 70–99)
Glucose-Capillary: 265 mg/dL — ABNORMAL HIGH (ref 70–99)
Glucose-Capillary: 266 mg/dL — ABNORMAL HIGH (ref 70–99)

## 2020-04-11 NOTE — Progress Notes (Signed)
STROKE TEAM PROGRESS NOTE   INTERVAL HISTORY The patient has remained unchanged.  Vitals:   04/10/20 1900 04/10/20 2350 04/11/20 0337 04/11/20 0742  BP: 117/60 109/75 109/65 101/68  Pulse: 91 80 74 74  Resp: (!) 22 18 18 18   Temp: 98.2 F (36.8 C) (!) 97.5 F (36.4 C) 97.7 F (36.5 C) (!) 97.5 F (36.4 C)  TempSrc: Oral Axillary Axillary Axillary  SpO2: 94% 98% 97% 95%  Weight:   81 kg   Height:        CBC:  Recent Labs  Lab 04/10/20 0539 04/11/20 0232  WBC 7.6 7.8  HGB 12.5* 12.1*  HCT 36.4* 34.9*  MCV 90.1 90.4  PLT 305 892    Basic Metabolic Panel:  Recent Labs  Lab 04/10/20 0539 04/11/20 0232  NA 136 138  K 3.7 3.7  CL 104 100  CO2 23 26  GLUCOSE 255* 215*  BUN 21* 22*  CREATININE 0.74 0.74  CALCIUM 9.0 9.0   IMAGING past 24 hours  DG Abd 1 View  Result Date: 04/10/2020 CLINICAL DATA:  History of rib fractures EXAM: ABDOMEN - 1 VIEW COMPARISON:  03/26/2020 FINDINGS: Esophageal tube tip overlies the distal stomach. Contrast material within the colon. Nonobstructed gas pattern. IMPRESSION: Esophageal tube tip overlies the distal stomach Electronically Signed   By: Donavan Foil M.D.   On: 04/10/2020 19:10   DG CHEST PORT 1 VIEW  Result Date: 04/10/2020 CLINICAL DATA:  History of rib fractures EXAM: PORTABLE CHEST 1 VIEW COMPARISON:  03/31/2020 FINDINGS: Esophageal tube tip is below the diaphragm. Increased airspace disease at the right base. Stable cardiomediastinal silhouette. No pneumothorax. IMPRESSION: Removal of endotracheal tube. Increased airspace disease at the right base may reflect atelectasis, pneumonia or aspiration Electronically Signed   By: Donavan Foil M.D.   On: 04/10/2020 19:09     PHYSICAL EXAM   Temp:  [97.5 F (36.4 C)-98.2 F (36.8 C)] 97.5 F (36.4 C) (05/08 0742) Pulse Rate:  [74-96] 74 (05/08 0742) Resp:  [18-22] 18 (05/08 0742) BP: (96-117)/(59-88) 101/68 (05/08 0742) SpO2:  [94 %-100 %] 95 % (05/08 0742) Weight:  [81 kg]  81 kg (05/08 0337)  General - Well nourished, well developed, not in acute distress  Ophthalmologic - fundi not visualized due to noncooperation.  Cardiovascular - Regular rhythm and rate.  Abdomen - no tenderness on palpitation of abdomen or lower chest, rib  Neuro - eyes open but global aphasia, did not follow commands.  Eyes left gaze deviation able to cross midline slightly, not blinking to visual threat on the right, blinking to threat on the left,  able to track on the left but not right. Mild lower right facial droop.  Tongue protrusion not cooperative. Spontaneous movement of LUE and LLE, LUE at least 4/5, no drift when lifted in air. LLE 3/5 with pain stimulation. RUE flaccid, right-sided neglect, RLE slight withdraw to pain. DTR 1+. Right upgoing toe, left downgoing toe. Sensation, coordination and gait not tested.   ASSESSMENT/PLAN [name] is a 59 y.o. male with history of HTN and DB noncompliant w/ medications presenting to [facility] with R sided weakness and inability to talk. NIH 28. CTA showed L M1 occlusion. Transferred to [employer]. On arrival, received IV tPA 03/24/2020 at 0725 followed by mechanical thrombectomy w/ TICI2C revascularization and rescue stent placement. Admitted to the ICU. transititioned out with transfer back to neuro ICU 5/1 with worsening edema for hypertonic saline protocol.  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 Old L corona radiata and internal capsule infarcts. ASPECTS 10.     CTA head & neck large L MCA territory infarct. L M1 LVO. Collateral vessels in sylvian fissure. B ICA bifurcation atherosclerosis. Aortic atherosclerosis.   CT perfusion large L MCA penumbra.   Cerebral angio L MCA M1 occlusion w/  TICI2c revascularization x 6 passes. Rescue stenting for reocclusion   CT head 4/21 - evolving large left MCA infarct, trace midline shift, persistent high density and left C1 fissure, contrast versus blood  MRI  Large L MCA infarct evolution w/ edema and mass effect w/ 40mm L midline shift. SAH L sylvian fissure, L cerebral cortical sulci and basilar cisterns.  MRA  L M1 stent w/o flow c/w reocclusion. L ICA filling defect c/w small thrombus. VBJ w/ infundibulum vs aneurysm.  CT head 4/23 continued evolution large L MCA infarct w/ increased edema, now w/ 5-25mm midline shift. Stable L SAH w/ trace blood in tentorium.    CT head 4/24 - stable 5 mm shift. Large Lt MCA infarct. Subarachnoid blood and/or contrast in and around the left Sylvian fissure.   CT Head 03/29/2020 - Sequelae of large Left MCA territory infarct remain stable since 03/27/2020. Rightward midline shift remains 5 mm.  CT Head 04/04/2020 - Evolving large left MCA territory infarction with probable petechial hemorrhage. Progression of edema and mass effect with midline shift now measuring 9 mm and probable early trapping of the right lateral ventricle.  CT Head 04/06/2020 - unchanged, still w/ 73mm midline shift    CT Head 04/08/2020 - evolving L MCA infarct w/ similar mass effect   2D Echo EF 55-60%. No source of embolus   LE doppler neg DVT  May consider TEE and loop recorder at outpt with neuro follow up if further neuro improvement   TCD bubble study positive for clinically insignificant PFO     LDL 55.1  TG 798->675->269 (on 4/22)   HgbA1c 9.2  SCDs for VTE prophylaxis  No antithrombotic prior to admission, now on aspirin 81 mg daily and Brilinta (ticagrelor) 90 mg bid given stent placement and L tICA thrombus.   Therapy recommendations: CIR - admission coordinator working on insurance approval  Disposition:  pending   Medically ready for d/c to rehab once bed found  Cerebral edema Induced Hypernatremia,  resolved  CT showed large left MCA infarct with minimal midline shift  MRI  Large L MCA infarct evolution w/ edema and mass effect w/ 68mm L midline shift.   CT head 4/23 continued evolution large L MCA infarct w/ increased edema, now w/ 5-39mm midline shift. Stable L SAH w/ trace blood in tentorium.   CT head 4/25 stable 12mm MLS  CT Head 04/04/2020 - Evolving large left MCA territory infarction with probable petechial hemorrhage. Progression of edema and mass effect with midline shift now measuring 9 mm and probable early trapping of the right lateral ventricle.  CT Head 04/06/2020 - unchanged, still w/ 48mm midline shift    CT Head 04/08/2020 - evolving L MCA infarct w/ similar mass effect  3% saline off  Na 136  Acute Respiratory Failure, resolved PNA, resolved  Intubated for IR, left intubated post IR for airway protection secondary to acute  L MCA stroke  Extubation 4/22  Tolerating well so far  Temp - afebrile ; WBCs - 9.3->7.8 (temp 97.5)  Ancef 4/27>>5/2 for pneumonia  CXR - 04/10/20 - Removal of endotracheal tube. Increased airspace disease at the right base may reflect atelectasis, pneumonia or aspiration  Hypertensive Emergency  SBP > 210 on arrival   Home meds:  Non-compliant - med rec lists:  losartan 25   Cleviprex changed to cardene d/t elevated TG -> now off  Cozaar 50 daily . Hydralazine PRN . Long-term BP goal normotensive  Hyperlipidemia  Home meds:  Non-compliant -  pravachol 10, crestor 5  LDL 55.1, goal < 70  On lipitor 80  Continue statin at discharge  Diabetes type II Uncontrolled  Home meds:  Noncompliant - med rec lists:  dapagliflozin 10, glipizide 5, actos 30, dapagliflozin 10 / metformin 1000  HgbA1c 9.2, goal < 7.0  Lantus 30 bid, novolog 5 u tid   CBGs  SSI 0-15  DM coordinator on board  Dysphagia . Secondary to stroke . Now on dys 1 and honey thick liquid . Put on nocturnal feeding . Has Cortrak . Speech on  board . Dietitian consult for calore count . Encourage po intake . Free water added - 100 q6h   Other Stroke Risk Factors  Hx ETOH use, alcohol level <10, will advise family that he should drink no more than 2 drink(s) a day  Substance abuse - UDS:  Benzos POSITIVE  Overweight, Body mass index is 29.72 kg/m., recommend weight loss, diet and exercise as appropriate   Other Active Problems  Code status - Full code  Thrombocytopenia PLT - resolved  Hypokalemia K - resolved - 3.7  Urinary retention - I&O cath, foley prn  D/c questran  Pt seems to hold his stomach with grimace while working with PT/OT. Wife said pt had rib fracture in the past. CXR and KUB unremarkable.   Hospital day # 18      To contact Stroke Continuity provider, please refer to [email or web address]. After hours, contact General Neurology

## 2020-04-12 LAB — CBC
HCT: 35.7 % — ABNORMAL LOW (ref 39.0–52.0)
Hemoglobin: 12 g/dL — ABNORMAL LOW (ref 13.0–17.0)
MCH: 30.3 pg (ref 26.0–34.0)
MCHC: 33.6 g/dL (ref 30.0–36.0)
MCV: 90.2 fL (ref 80.0–100.0)
Platelets: 285 10*3/uL (ref 150–400)
RBC: 3.96 MIL/uL — ABNORMAL LOW (ref 4.22–5.81)
RDW: 12.2 % (ref 11.5–15.5)
WBC: 8.2 10*3/uL (ref 4.0–10.5)
nRBC: 0 % (ref 0.0–0.2)

## 2020-04-12 LAB — BASIC METABOLIC PANEL
Anion gap: 7 (ref 5–15)
BUN: 21 mg/dL — ABNORMAL HIGH (ref 6–20)
CO2: 26 mmol/L (ref 22–32)
Calcium: 9 mg/dL (ref 8.9–10.3)
Chloride: 101 mmol/L (ref 98–111)
Creatinine, Ser: 0.87 mg/dL (ref 0.61–1.24)
GFR calc Af Amer: >60 mL/min (ref >60)
GFR calc non Af Amer: >60 mL/min (ref >60)
Glucose, Bld: 275 mg/dL — ABNORMAL HIGH (ref 70–99)
Potassium: 3.9 mmol/L (ref 3.5–5.1)
Sodium: 134 mmol/L — ABNORMAL LOW (ref 135–145)

## 2020-04-12 LAB — GLUCOSE, CAPILLARY
Glucose-Capillary: 268 mg/dL — ABNORMAL HIGH (ref 70–99)
Glucose-Capillary: 153 mg/dL — ABNORMAL HIGH (ref 70–99)
Glucose-Capillary: 273 mg/dL — ABNORMAL HIGH (ref 70–99)
Glucose-Capillary: 238 mg/dL — ABNORMAL HIGH (ref 70–99)
Glucose-Capillary: 370 mg/dL — ABNORMAL HIGH (ref 70–99)

## 2020-04-12 NOTE — Progress Notes (Signed)
Physical Therapy Treatment Patient Details Name: Brad Delacruz MRN: [id number] DOB: 1961-05-18 Today's Date: 04/12/2020    History of Present Illness 59 y.o. male past medical history of diabetes, hypertension which he does not take medication for, presented to the emergency room at [facility] with last known well of 5 AM when he was at work and was noted to have a sudden onset of right-sided weakness and inability to talk. Pt given TPA and s/p thrombectomy on 4/20.  Was intubated and restrained as of 4/22,  extubated 4/28 and now drowsy.  L MCA revascularization with tPA, acute respiratory failure from possible Asp PNA, now referred to rehab.  PMHx:  DM, HTN,  On 5/2 pt with worsening of bleed to 22m, transferred to ICU and placed on 3% saline.    PT Comments    Patient progressing very slowly towards PT goals. Following ~30% of motor commands with a few appropriate head nods during session. Pt smiled when wife asked him a question relating to him being tired. Pt with very delayed processing and response to simple 1 step commands. Some initiation noted with more familiar functional tasks. Max A for sitting balance, working on trunk and core activation. Pt appears tired especially initially needing cues to keep eyes open and with attention. Will continue to follow and progress as tolerated.   Follow Up Recommendations  CIR;Supervision/Assistance - 24 hour     Equipment Recommendations  Wheelchair (measurements PT);Wheelchair cushion (measurements PT);Hospital bed    Recommendations for Other Services       Precautions / Restrictions Precautions Precautions: Fall;Other (comment) Precaution Comments: cortrak Restrictions Weight Bearing Restrictions: No    Mobility  Bed Mobility Overal bed mobility: Needs Assistance Bed Mobility: Rolling;Sidelying to Sit;Sit to Sidelying Rolling: Mod assist Sidelying to sit: Total assist;+2 for physical assistance     Sit to sidelying:  +2 for physical assistance;Max assist General bed mobility comments: cues for sequencing, to bend left knee and push through heel to move bottom towards EOB, assist to elevate trunk to get to EOB. Rolling to right/eft for pericare with increased time/cues but initiation noted.  Transfers                 General transfer comment: Deferred due to lethargy.  Ambulation/Gait                 Stairs             Wheelchair Mobility    Modified Rankin (Stroke Patients Only) Modified Rankin (Stroke Patients Only) Pre-Morbid Rankin Score: No symptoms Modified Rankin: Severe disability     Balance Overall balance assessment: Needs assistance Sitting-balance support: Single extremity supported;No upper extremity supported Sitting balance-Leahy Scale: Zero Sitting balance - Comments: max assist to maintain sitting balance EOB. Worked on coming down on elbows bilaterally to activate core/trunk. Initiated movement. Noted to have some pushing with LUE Postural control: Posterior lean                                  Cognition Arousal/Alertness: Lethargic Behavior During Therapy: Flat affect Overall Cognitive Status: Impaired/Different from baseline Area of Impairment: Attention;Problem solving;Following commands                   Current Attention Level: Focused   Following Commands: Follows one step commands inconsistently Safety/Judgement: Decreased awareness of safety;Decreased awareness of deficits   Problem Solving: Slow processing;Decreased initiation;Difficulty sequencing;Requires  tactile cues;Requires verbal cues General Comments: Follows <50% of motor commands, likely closer to 30% inconsistently. Able to give a thumbs up after 3 minutes. Delayed processing and response time. Nods yes/no at end of session with regards to wanting blanket or pillow under RLE. Spoke with daughter who said he understands [place] as well as [place]. Wife speaking  in [place] did not change commands following but pt did smile to something she said appropriately. Appears tired. Opened eyes to name.Cues to keep eyes opened.      Exercises      General Comments General comments (skin integrity, edema, etc.): Wife present during session. Spoke with daughter on phone and she stated he understands English as well just sometimes takes him longer to process if not basic.      Pertinent Vitals/Pain Pain Assessment: Faces Faces Pain Scale: Hurts little more Pain Location: grimacing during certain movements esp on right side Pain Descriptors / Indicators: Grimacing Pain Intervention(s): Monitored during session;Repositioned    Home Living                      Prior Function            PT Goals (current goals can now be found in the care plan section) Progress towards PT goals: Progressing toward goals(slowly)    Frequency    Min 4X/week      PT Plan Current plan remains appropriate    Co-evaluation              AM-PAC PT "6 Clicks" Mobility   Outcome Measure  Help needed turning from your back to your side while in a flat bed without using bedrails?: A Lot Help needed moving from lying on your back to sitting on the side of a flat bed without using bedrails?: Total Help needed moving to and from a bed to a chair (including a wheelchair)?: Total Help needed standing up from a chair using your arms (e.g., wheelchair or bedside chair)?: Total Help needed to walk in hospital room?: Total Help needed climbing 3-5 steps with a railing? : Total 6 Click Score: 7    End of Session   Activity Tolerance: Patient limited by lethargy;Patient tolerated treatment well Patient left: in bed;with call bell/phone within reach;with bed alarm set;with family/visitor present Nurse Communication: Mobility status;Need for lift equipment PT Visit Diagnosis: Muscle weakness (generalized) (M62.81);Other symptoms and signs involving the nervous  system (R29.898);Hemiplegia and hemiparesis Hemiplegia - Right/Left: Right Hemiplegia - dominant/non-dominant: Dominant Hemiplegia - caused by: Cerebral infarction     Time: 1443-1540 PT Time Calculation (min) (ACUTE ONLY): 26 min  Charges:  $Therapeutic Activity: 8-22 mins $Neuromuscular Re-education: 8-22 mins                     Marisa Severin, PT, DPT Acute Rehabilitation Services Pager [phone] Office [place] 04/12/2020, 12:08 PM

## 2020-04-12 NOTE — Progress Notes (Signed)
STROKE TEAM PROGRESS NOTE   INTERVAL HISTORY The patient has remained unchanged.  Vitals:   04/12/20 0333 04/12/20 0338 04/12/20 0718 04/12/20 1140  BP: 122/60  105/81 110/63  Pulse: 88  80 86  Resp: 19  18 17   Temp: 98.2 F (36.8 C)  98.4 F (36.9 C) 97.6 F (36.4 C)  TempSrc: Axillary  Oral Oral  SpO2: 97%  100% 98%  Weight:  80.2 kg    Height:        CBC:  Recent Labs  Lab 04/11/20 0232 04/12/20 0435  WBC 7.8 8.2  HGB 12.1* 12.0*  HCT 34.9* 35.7*  MCV 90.4 90.2  PLT 304 285    Basic Metabolic Panel:  Recent Labs  Lab 04/11/20 0232 04/12/20 0435  NA 138 134*  K 3.7 3.9  CL 100 101  CO2 26 26  GLUCOSE 215* 275*  BUN 22* 21*  CREATININE 0.74 0.87  CALCIUM 9.0 9.0   IMAGING past 24 hours  No results found.   PHYSICAL EXAM   Temp:  [97.6 F (36.4 C)-98.4 F (36.9 C)] 97.6 F (36.4 C) (05/09 1140) Pulse Rate:  [80-101] 86 (05/09 1140) Resp:  [16-20] 17 (05/09 1140) BP: (102-122)/(60-81) 110/63 (05/09 1140) SpO2:  [93 %-100 %] 98 % (05/09 1140) Weight:  [80.2 kg] 80.2 kg (05/09 0338)  General - Well nourished, well developed, not in acute distress  Ophthalmologic - fundi not visualized due to noncooperation.  Cardiovascular - Regular rhythm and rate.  Abdomen - no tenderness on palpitation of abdomen or lower chest, rib  Neuro - eyes open but global aphasia, did not follow commands.  Eyes left gaze deviation able to cross midline slightly, not blinking to visual threat on the right, blinking to threat on the left,  able to track on the left but not right. Mild lower right facial droop.  Tongue protrusion not cooperative. Spontaneous movement of LUE and LLE, LUE at least 4/5, no drift when lifted in air. LLE 3/5 with pain stimulation. RUE flaccid, right-sided neglect, RLE slight withdraw to pain. DTR 1+. Right upgoing toe, left downgoing toe. Sensation, coordination and gait not tested.   ASSESSMENT/PLAN [name] is a 59 y.o. male  with history of HTN and DB noncompliant w/ medications presenting to [facility] with R sided weakness and inability to talk. NIH 28. CTA showed L M1 occlusion. Transferred to [facility]. [facility]. On arrival, received IV tPA 03/24/2020 at 0725 followed by mechanical thrombectomy w/ TICI2C revascularization and rescue stent placement. Admitted to the ICU. transititioned out with transfer back to neuro ICU 5/1 with worsening edema for hypertonic saline protocol.    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 Old L corona radiata and internal capsule infarcts. ASPECTS 10.     CTA head & neck large L MCA territory infarct. L M1 LVO. Collateral vessels in sylvian fissure. B ICA bifurcation atherosclerosis. Aortic atherosclerosis.   CT perfusion large L MCA penumbra.   Cerebral angio L MCA M1 occlusion w/ TICI2c revascularization x 6 passes. Rescue stenting for reocclusion   CT head 4/21 - evolving large left MCA infarct, trace midline shift, persistent high density and left C1 fissure, contrast versus blood  MRI  Large L MCA infarct evolution  w/ edema and mass effect w/ 20mm L midline shift. SAH L sylvian fissure, L cerebral cortical sulci and basilar cisterns.  MRA  L M1 stent w/o flow c/w reocclusion. L ICA filling defect c/w small thrombus. VBJ w/ infundibulum vs aneurysm.  CT head 4/23 continued evolution large L MCA infarct w/ increased edema, now w/ 5-45mm midline shift. Stable L SAH w/ trace blood in tentorium.    CT head 4/24 - stable 5 mm shift. Large Lt MCA infarct. Subarachnoid blood and/or contrast in and around the left Sylvian fissure.   CT Head 03/29/2020 - Sequelae of large Left MCA territory infarct remain stable since 03/27/2020. Rightward midline shift remains 5  mm.  CT Head 04/04/2020 - Evolving large left MCA territory infarction with probable petechial hemorrhage. Progression of edema and mass effect with midline shift now measuring 9 mm and probable early trapping of the right lateral ventricle.  CT Head 04/06/2020 - unchanged, still w/ 71mm midline shift    CT Head 04/08/2020 - evolving L MCA infarct w/ similar mass effect   2D Echo EF 55-60%. No source of embolus   LE doppler neg DVT  May consider TEE and loop recorder at outpt with neuro follow up if further neuro improvement   TCD bubble study positive for clinically insignificant PFO     LDL 55.1  TG 798->675->269 (on 4/22)   HgbA1c 9.2  SCDs for VTE prophylaxis  No antithrombotic prior to admission, now on aspirin 81 mg daily and Brilinta (ticagrelor) 90 mg bid given stent placement and Lt ICA thrombus.   Therapy recommendations: CIR - admission coordinator working on insurance approval  Disposition:  pending   Medically ready for d/c to rehab once bed found  Cerebral edema Induced Hypernatremia, resolved  CT showed large left MCA infarct with minimal midline shift  MRI  Large L MCA infarct evolution w/ edema and mass effect w/ 71mm L midline shift.   CT head 4/23 continued evolution large L MCA infarct w/ increased edema, now w/ 5-46mm midline shift. Stable L SAH w/ trace blood in tentorium.   CT head 4/25 stable 55mm MLS  CT Head 04/04/2020 - Evolving large left MCA territory infarction with probable petechial hemorrhage. Progression of edema and mass effect with midline shift now measuring 9 mm and probable early trapping of the right lateral ventricle.  CT Head 04/06/2020 - unchanged, still w/ 8mm midline shift    CT Head 04/08/2020 - evolving L MCA infarct w/ similar mass effect  3% saline off  Na 136  Acute Respiratory Failure, resolved PNA, resolved  Intubated for IR, left intubated post IR for airway protection secondary to acute L MCA stroke  Extubation  4/22  Tolerating well so far  Temp - afebrile ; WBCs - 9.3->7.8->8.2 (temp 97.6)  Ancef 4/27>>5/2 for pneumonia  CXR - 04/10/20 - Removal of endotracheal tube. Increased airspace disease at the right base may reflect atelectasis, pneumonia or aspiration  Hypertensive Emergency  SBP > 210 on arrival   Home meds:  Non-compliant - med rec lists:  losartan 25   Cleviprex changed to cardene d/t elevated TG -> now off  Cozaar 50 daily . Hydralazine PRN . Long-term BP goal normotensive  Hyperlipidemia  Home meds:  Non-compliant -  pravachol 10, crestor 5  LDL 55.1, goal < 70  On lipitor 80  Continue statin at discharge  Diabetes type II Uncontrolled  Home meds:  Noncompliant - med rec lists:  dapagliflozin 10, glipizide 5,  actos 30, dapagliflozin 10 / metformin 1000  HgbA1c 9.2, goal < 7.0  Lantus 30 bid, novolog 5 u tid   CBGs  SSI 0-15  DM coordinator on board  Dysphagia . Secondary to stroke . Now on dys 1 and honey thick liquid . Put on nocturnal feeding . Has Cortrak . Speech on board . Dietitian consult for calore count . Encourage po intake . Free water added - 100 q6h   Other Stroke Risk Factors  Hx ETOH use, alcohol level <10, will advise family that he should drink no more than 2 drink(s) a day  Substance abuse - UDS:  Benzos POSITIVE  Overweight, Body mass index is 29.42 kg/m., recommend weight loss, diet and exercise as appropriate   Other Active Problems  Code status - Full code  Thrombocytopenia PLT - resolved  Hypokalemia K - resolved - 3.7->3.9  Urinary retention - I&O cath, foley prn  D/c questran  Pt seems to hold his stomach with grimace while working with PT/OT. Wife said pt had rib fracture in the past. CXR and KUB unremarkable.   Hospital day # 19      To contact Stroke Continuity provider, please refer to [email or web address] After hours, contact General Neurology

## 2020-04-13 LAB — GLUCOSE, CAPILLARY
Glucose-Capillary: 225 mg/dL — ABNORMAL HIGH (ref 70–99)
Glucose-Capillary: 254 mg/dL — ABNORMAL HIGH (ref 70–99)
Glucose-Capillary: 263 mg/dL — ABNORMAL HIGH (ref 70–99)
Glucose-Capillary: 264 mg/dL — ABNORMAL HIGH (ref 70–99)
Glucose-Capillary: 271 mg/dL — ABNORMAL HIGH (ref 70–99)
Glucose-Capillary: 285 mg/dL — ABNORMAL HIGH (ref 70–99)
Glucose-Capillary: 290 mg/dL — ABNORMAL HIGH (ref 70–99)

## 2020-04-13 MED ORDER — HEPARIN SODIUM (PORCINE) 5000 UNIT/ML IJ SOLN: 5000.0000 [IU] | INTRAMUSCULAR | Status: DC

## 2020-04-13 MED ORDER — INSULIN ASPART 100 UNIT/ML ~~LOC~~ SOLN
5.0000 [IU] | Freq: Two times a day (BID) | SUBCUTANEOUS | Status: DC
  Administered 2020-04-13 – 2020-04-15 (×4): 5 [IU] via SUBCUTANEOUS

## 2020-04-13 MED ORDER — HEPARIN SODIUM (PORCINE) 5000 UNIT/ML IJ SOLN
5000.0000 [IU] | Freq: Three times a day (TID) | INTRAMUSCULAR | Status: DC
  Administered 2020-04-13 – 2020-04-16 (×10): 5000 [IU] via SUBCUTANEOUS
  Filled 2020-04-13 (×10): qty 1

## 2020-04-13 MED ORDER — INSULIN GLARGINE 100 UNIT/ML ~~LOC~~ SOLN
35.0000 [IU] | Freq: Two times a day (BID) | SUBCUTANEOUS | Status: DC
  Administered 2020-04-13 – 2020-04-16 (×5): 35 [IU] via SUBCUTANEOUS
  Filled 2020-04-13 (×7): qty 0.35

## 2020-04-13 NOTE — TOC Progression Note (Signed)
Transition of Care [facility]) - Progression Note    Patient Details  Name: Brad Delacruz MRN: [id number] Date of Birth: 09/21/1961  Transition of Care [facility]) CM/SW Contact  Kermit Balo, RN Phone Number: 04/13/2020, 11:37 AM  Clinical Narrative:    Received information from [employer] that the patients stay and rehab will fall under workers comp. His CM is [employer] [phone]. This information provided to [facility] with CIR.  TOC following.   Expected Discharge Plan: IP Rehab Facility Barriers to Discharge: Continued Medical Work up  Expected Discharge Plan and Services Expected Discharge Plan: IP Rehab Facility   Discharge Planning Services: CM Consult   Living arrangements for the past 2 months: Single Family Home                                       Social Determinants of Health (SDOH) Interventions    Readmission Risk Interventions No flowsheet data found.

## 2020-04-13 NOTE — Progress Notes (Addendum)
Calorie Count Note  48 hour calorie count ordered.  Diet: dysphagia 1 diet with honey thick liquid Supplements: Magic cup TID with meals, each supplement provides 290 kcal and 9 grams of protein; Osmolite 1.5 cal @ 73ml/hr via Cortrak for 12 hours (1800-0600)  86ml Pro-stat daily via Cortrak  free water Q4H via Cortrak   Tube feeding regimen will provide 1180 kcals (meets 59% of estimated needs), 60 grams protein, free water ( with free water flushes)  Calorie count data incomplete. No envelope on door with tickets. Used available meal completion data in chart to estimate intake.   04/10/20 Breakfast: 436 kcals, 14 grams protein Lunch: 789 kcals, 38 grams protein Dinner: 706 kcals, 26 grams protein  Total intake: 1931 kcal (91% of minimum estimated needs)  78 grams protein (74% of minimum estimated needs)  04/11/20 Breakfast: 755 kcals, 43 grams protein  Total intake: 755 kcal (38% of minimum estimated needs)  43 grams protein (41% of minimum estimated needs)  04/12/20 Nothing documented  04/13/20 Breakfast: 640 kcals, 37 grams protein Lunch: 658 kcals, 38 grams protein  Total intake: 1298 kcal (65% of minimum estimated needs)  75 grams protein (71% of minimum estimated needs)  Average Total intake: 1328 kcal (66% of minimum estimated needs)  65 grams protein (62% of minimum estimated needs)  Nutrition Dx: Inadequate oral intake related to inability to eat as evidenced by NPO status.  Progressing, pt now on Dysphagia 1 diet with Honey Thick liquids.   Goal: Patient will meet greater than or equal to 90% of their needs; progressing   Intervention:   -D/c calorie count  -Continue nocturnal feeding:   Osmolite 1.5 cal @ 49ml/hr via Cortrak for 12 hours (1800-0600)  D/c 27ml Pro-stat daily via Cortrak  100 ml free water every 6 hours via Cortrak   Tube feeding regimen will provide 1080 kcals (meets 54% of estimated needs), 45 grams  protein, free water ( with free water flushes)  - Magic cup TID with meals, each supplement provides 290 kcal and 9 grams of protein  -Feeding assistance with meals  Levada Schilling, RD, LDN, CDCES Registered Dietitian II Certified Diabetes Care and Education Specialist Please refer to [facility] for RD and/or RD on-call/weekend/after hours pager

## 2020-04-13 NOTE — Progress Notes (Signed)
Inpatient Diabetes Program Recommendations  AACE/ADA: New Consensus Statement on Inpatient Glycemic Control   Target Ranges:  Prepandial:   less than 140 mg/dL      Peak postprandial:   less than 180 mg/dL (1-2 hours)      Critically ill patients:  140 - 180 mg/dL   Results for KADEEM, HYLE (MRN [id number]) as of 04/13/2020 12:21  Ref. Range 04/13/2020 00:23 04/13/2020 04:45 04/13/2020 07:21 04/13/2020 11:22  Glucose-Capillary Latest Ref Range: 70 - 99 mg/dL 785 (H) 885 (H) 027 (H) 271 (H)  Results for GLADSTONE, ROSAS (MRN [id number]) as of 04/13/2020 12:21  Ref. Range 04/12/2020 07:53 04/12/2020 12:26 04/12/2020 16:49 04/12/2020 21:05  Glucose-Capillary Latest Ref Range: 70 - 99 mg/dL 672 (H) 094 (H) 709 (H) 370 (H)   Review of Glycemic Control  Current orders for Inpatient glycemic control: Lantus 30 units BID, Novolog 5 units TID with meals, Novolog 0-15 units Q4H; Osmolite @ 60 ml/hr (from 18:00-6:00)  Inpatient Diabetes Program Recommendations:   Insulin-Tube Feeding: Glucose consistently elevated during time that tube feeding is running (from 6pm-6am).  Please consider ordering Novolog 5 units BID (at 18:00 and at 00:00) to coverage tube feeding.  Insulin-Basal: Please consider increasing Lantus to 35 units BID.  Thanks, Orlando Penner, RN, MSN, CDE Diabetes Coordinator Inpatient Diabetes Program [phone] (Team Pager from 8am to 5pm)

## 2020-04-13 NOTE — Progress Notes (Signed)
STROKE TEAM PROGRESS NOTE   INTERVAL HISTORY Patient is sitting up in bed.  His wife is feeding him lunch.  He remains aphasic but follows simple midline commands.  Vital signs stable.  No neuro changes  Vitals:   04/13/20 0500 04/13/20 0723 04/13/20 1120 04/13/20 1531  BP:  127/79 127/70 114/67  Pulse:  82 81 (!) 105  Resp:  18 18 18   Temp:  98.6 F (37 C) 98 F (36.7 C) 98 F (36.7 C)  TempSrc:  Oral Axillary Oral  SpO2:  100% 94% 97%  Weight: 80.4 kg     Height:        CBC:  Recent Labs  Lab 04/11/20 0232 04/12/20 0435  WBC 7.8 8.2  HGB 12.1* 12.0*  HCT 34.9* 35.7*  MCV 90.4 90.2  PLT 304 285    Basic Metabolic Panel:  Recent Labs  Lab 04/11/20 0232 04/12/20 0435  NA 138 134*  K 3.7 3.9  CL 100 101  CO2 26 26  GLUCOSE 215* 275*  BUN 22* 21*  CREATININE 0.74 0.87  CALCIUM 9.0 9.0   IMAGING past 24 hours No results found.   PHYSICAL EXAM   General -mildly obese middle-aged Hispanic male, not in acute distress  Ophthalmologic - fundi not visualized due to noncooperation.  Cardiovascular - Regular rhythm and rate.  Abdomen - no tenderness on palpitation of abdomen or lower chest, rib  Neuro - eyes open but global aphasia, did not follow  most commands.  Except midline and simple one-step commands Eyes left gaze deviation able to cross midline slightly, not blinking to visual threat on the right, blinking to threat on the left,  able to track on the left but not right. Mild lower right facial droop.  Tongue protrusion not cooperative. Spontaneous movement of LUE and LLE, LUE at least 4/5, no drift when lifted in air. LLE 3/5 with pain stimulation. RUE flaccid, right-sided neglect, RLE slight withdraw to pain. DTR 1+. Right upgoing toe, left downgoing toe. Sensation, coordination and gait not tested.   ASSESSMENT/PLAN Brad Delacruz is a 59 y.o. male with history of HTN and DB noncompliant w/ medications presenting to [facility] with R  sided weakness and inability to talk. NIH 28. CTA showed L M1 occlusion. Transferred to [facility]. [facility]. On arrival, received IV tPA 03/24/2020 at 0725 followed by mechanical thrombectomy w/ TICI2C revascularization and rescue stent placement. Admitted to the ICU. transititioned out with transfer back to neuro ICU 5/1 with worsening edema for hypertonic saline protocol.    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   Therapy recommendations: CIR  Disposition:  pending    Medically ready for d/c to rehab once bed found  D/c tele  Hypertension  Cozaar 50 daily  Hyperlipidemia  On lipitor 80  Diabetes type II Uncontrolled  Noncompliant w/ meds PTA  Lantus 30-> 35 bid, change novolog 5 u bid at 1800 and 0000 for HS TF coverage  CBGs  SSI 0-15  DM coordinator on board  Dysphagia .  Secondary to stroke . Now on dys 1 and honey thick liquid . Put on nocturnal feeding . Has Cortrak . Speech on board . Dietitian consult for calore count . Encourage po intake . Free water added - 100 q6h   Other Stroke Risk Factors  Hx ETOH use  Substance abuse - UDS:  Benzos POSITIVE  Overweight, Body mass index is 29.5 kg/m., recommend weight loss, diet and exercise as appropriate   Other Active Problems  Code status - Full code  Urinary retention - I&O cath, foley prn  Pt seems to hold his stomach with grimace while working with PT/OT. Wife said pt had rib fracture in the past. CXR and KUB unremarkable.   Patient neurologically stable.  Encourage oral intake during the day.   Continue ongoing therapies.  Medically stable to be transferred to inpatient rehab when bed available.  Discussed with patient and wife and answered questions.  Greater than 50% time during this 25-minute visit was spent on counseling and coordination of care and discussion with patient and wife and answering questions Antony Contras, MD Medical Director [place] Pager: [phone] 04/13/2020 [facility] day # 20      To contact Stroke Continuity provider, please refer to [email or web address] After hours, contact General Neurology35

## 2020-04-13 NOTE — Hospital Course (Signed)
[name] is a 59 y.o. male with history of HTN and DB noncompliant w/ medications presenting to [facility] with R sided weakness and inability to talk. NIH 28. CTA showed L M1 occlusion. Transferred to [facility]. [facility]. On arrival, received IV tPA 03/24/2020 at 0725 followed by mechanical thrombectomy w/ TICI2C revascularization and rescue stent placement. Admitted to the ICU. transititioned out with transfer back to neuro ICU 5/1 with worsening edema for hypertonic saline protocol.     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 Old L corona radiata and internal capsule infarcts. ASPECTS 10.    CTA head & neck large L MCA territory infarct. L M1 LVO. Collateral vessels in sylvian fissure. B ICA bifurcation atherosclerosis. Aortic atherosclerosis.  CT perfusion large L MCA penumbra.  Cerebral angio L MCA M1 occlusion w/ TICI2c revascularization x 6 passes. Rescue stenting for reocclusion  CT head 4/21 - evolving large left MCA infarct, trace midline shift, persistent high density and left C1 fissure, contrast versus blood MRI  Large L MCA infarct evolution w/ edema and mass effect w/ 68mm L midline shift. SAH L sylvian fissure, L cerebral cortical sulci and basilar cisterns. MRA  L M1 stent w/o flow c/w reocclusion. L ICA filling defect c/w small thrombus. VBJ w/ infundibulum vs aneurysm. CT head 4/23 continued evolution large L MCA infarct w/ increased edema, now w/ 5-2mm midline shift. Stable L SAH w/ trace blood in tentorium.   CT head 4/24 - stable 5 mm shift. Large Lt MCA infarct. Subarachnoid blood and/or contrast in and around the left Sylvian fissure.  CT Head 03/29/2020 - Sequelae of large Left MCA territory infarct remain stable since 03/27/2020. Rightward midline  shift remains 5 mm. CT Head 04/04/2020 - Evolving large left MCA territory infarction with probable petechial hemorrhage. Progression of edema and mass effect with midline shift now measuring 9 mm and probable early trapping of the right lateral ventricle. CT Head 04/06/2020 - unchanged, still w/ 45mm midline shift   CT Head 04/08/2020 - evolving L MCA infarct w/ similar mass effect  2D Echo EF 55-60%. No source of embolus  LE doppler neg DVT May consider TEE and loop recorder at outpt with neuro follow up if further neuro improvement  TCD bubble study positive for clinically insignificant PFO    LDL 55.1 TG 798->675->269 (on 4/22)  HgbA1c 9.2 SCDs for VTE prophylaxis No antithrombotic prior to admission, now on aspirin 81 mg daily and Brilinta (ticagrelor) 90 mg bid given stent placement and Lt ICA thrombus.  Therapy recommendations: CIR - admission coordinator working on insurance approval Disposition:  pending  Medically ready for d/c to rehab once bed found   Cerebral edema Induced Hypernatremia, resolved CT showed large left MCA infarct with minimal midline shift MRI  Large L MCA infarct evolution w/ edema and mass effect w/ 49mm L midline shift.  CT head 4/23 continued evolution large L MCA infarct w/ increased edema, now w/ 5-24mm midline shift. Stable L SAH w/ trace blood in tentorium.  CT head 4/25 stable 31mm MLS CT Head 04/04/2020 - Evolving large left MCA territory infarction with probable petechial hemorrhage. Progression of edema and mass effect with midline shift now measuring 9 mm and probable early trapping of the  right lateral ventricle. CT Head 04/06/2020 - unchanged, still w/ 60mm midline shift   CT Head 04/08/2020 - evolving L MCA infarct w/ similar mass effect 3% saline off Na 136   Acute Respiratory Failure, resolved PNA, resolved Intubated for IR, left intubated post IR for airway protection secondary to acute L MCA stroke Extubation 4/22 Tolerating well so far Temp -  afebrile ; WBCs - 9.3->7.8->8.2 (temp 97.6) Ancef 4/27>>5/2 for pneumonia CXR - 04/10/20 - Removal of endotracheal tube. Increased airspace disease at the right base may reflect atelectasis, pneumonia or aspiration   Hypertensive Emergency SBP > 210 on arrival  Home meds:  Non-compliant - med rec lists:  losartan 25  Cleviprex changed to cardene d/t elevated TG -> now off Cozaar 50 daily Hydralazine PRN Long-term BP goal normotensive   Hyperlipidemia Home meds:  Non-compliant -  pravachol 10, crestor 5 LDL 55.1, goal < 70 On lipitor 80 Continue statin at discharge   Diabetes type II Uncontrolled Home meds:  Noncompliant - med rec lists:  dapagliflozin 10, glipizide 5, actos 30, dapagliflozin 10 / metformin 1000 HgbA1c 9.2, goal < 7.0 Lantus 30 bid, novolog 5 u tid  CBGs SSI 0-15 DM coordinator on board   Dysphagia Secondary to stroke Now on dys 1 and honey thick liquid Put on nocturnal feeding Has Cortrak Speech on board Dietitian consult for calore count Encourage po intake Free water added - 100 q6h   Other Stroke Risk Factors Hx ETOH use, alcohol level <10, will advise family that he should drink no more than 2 drink(s) a day Substance abuse - UDS:  Benzos POSITIVE Overweight, Body mass index is 29.42 kg/m., recommend weight loss, diet and exercise as appropriate    Other Active Problems Code status - Full code Thrombocytopenia PLT - resolved Hypokalemia K - resolved - 3.7->3.9 Urinary retention - I&O cath, foley prn D/c questran Pt seems to hold his stomach with grimace while working with PT/OT. Wife said pt had rib fracture in the past. CXR and KUB unremarkable.

## 2020-04-14 LAB — GLUCOSE, CAPILLARY
Glucose-Capillary: 214 mg/dL — ABNORMAL HIGH (ref 70–99)
Glucose-Capillary: 198 mg/dL — ABNORMAL HIGH (ref 70–99)
Glucose-Capillary: 261 mg/dL — ABNORMAL HIGH (ref 70–99)
Glucose-Capillary: 247 mg/dL — ABNORMAL HIGH (ref 70–99)
Glucose-Capillary: 203 mg/dL — ABNORMAL HIGH (ref 70–99)
Glucose-Capillary: 259 mg/dL — ABNORMAL HIGH (ref 70–99)

## 2020-04-14 LAB — URINALYSIS, ROUTINE W REFLEX MICROSCOPIC
Bilirubin Urine: NEGATIVE
Glucose, UA: NEGATIVE mg/dL
Ketones, ur: NEGATIVE mg/dL
Nitrite: POSITIVE — AB
Protein, ur: 100 mg/dL — AB
Specific Gravity, Urine: 1.019 (ref 1.005–1.030)
WBC, UA: >50 WBC/hpf — ABNORMAL HIGH (ref 0–5)
pH: 6 (ref 5.0–8.0)

## 2020-04-14 MED ORDER — SODIUM CHLORIDE 0.9 % IV SOLN
1.0000 g | INTRAVENOUS | Status: DC
  Administered 2020-04-14 – 2020-04-15 (×2): 1 g via INTRAVENOUS
  Filled 2020-04-14 (×2): qty 10

## 2020-04-14 MED ORDER — HALOPERIDOL LACTATE 5 MG/ML IJ SOLN: 2.0000 mg | Freq: Three times a day (TID) | INTRAMUSCULAR | Status: DC | PRN

## 2020-04-14 NOTE — Progress Notes (Signed)
  Speech Language Pathology Treatment: Cognitive-Linquistic  Patient Details Name: Brad Delacruz MRN: [id number] DOB: 1961-01-06 Today's Date: 04/14/2020 Time: 1010-1040 SLP Time Calculation (min) (ACUTE ONLY): 30 min  Assessment / Plan / Recommendation Clinical Impression  Pt seen with wife at bedside. He was awake, but intermittently drowsy. Pt placed in a chair position with left arm resting on table, used to increased focused and sustained attention to attempted tasks with hand over hand assist from therapist to point, tap, hold items. Gaze deviated to left and draws fully to left when looking at images and items. Suspect double vision as pt squints and closes one eye often. Pt able to point to yes/no board in response to basic questions over 10 trials (all yes questions). With increased complexity Y/N questions pt accuracy decreased, but remained at 75 % indicating potential auditory comprehension ability. Pt also laughed appropriately several times during session suggesting some comprehension. 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 Pt did not initiate phonation, articulation during any task (counting pennies, social language, singing, melodic intonation). Will continue efforts.     HPI HPI: 58 y.o. male past medical history of diabetes, hypertension which he does not take medication for, presented to the emergency room at [facility] with last known well of 5 AM when he was at work and was noted to have a sudden onset of right-sided weakness and inability to talk. Pt given TPA and s/p thrombectomy on 4/20. Pt remains intubated and restrained as of 4/22.  PT was intubated from 4/20-4/22.  Head CT reported a large left MCA infarct.        SLP Plan  Continue with current plan of care       Recommendations                   Oral Care Recommendations: Oral care BID Follow up Recommendations: Inpatient Rehab SLP Visit  Diagnosis: Dysphagia, oropharyngeal phase (R13.12) Plan: Continue with current plan of care       GO               Brad Ditty, MA CCC-SLP  Acute Rehabilitation Services Pager [phone] Office [phone]  Brad Delacruz 04/14/2020, 10:43 AM

## 2020-04-14 NOTE — Progress Notes (Signed)
Occupational Therapy Progress Note (late entry)  Pt requires max A for bed mobility, but is able to assist with Lt UE.  Worked on EOB sitting - he required max A to periods of min A.  He demonstrates pusher syndrome, and demonstrates significant perceptual deficits with difficulty achieving midline orientation.  When Lt UE engaged, pushing decreased, and he was able to maintain sitting with min A for brief periods.  He requires max - total A for ADLs.  He demonstrates significant apraxia and communication deficits.  Wife appears very supportive.  Continue to recommend CIR.     04/13/20 1500  OT Visit Information  Last OT Received On 04/13/20  Assistance Needed +2  History of Present Illness 59 y.o. male past medical history of diabetes, hypertension which he does not take medication for, presented to the emergency room at [facility] with last known well of 5 AM when he was at work and was noted to have a sudden onset of right-sided weakness and inability to talk. Pt given TPA and s/p thrombectomy on 4/20.  Was intubated and restrained as of 4/22,  extubated 4/28 and now drowsy.  L MCA revascularization with tPA, acute respiratory failure from possible Asp PNA, now referred to rehab.  PMHx:  DM, HTN,  On 5/2 pt with worsening of bleed to 30m, transferred to ICU and placed on 3% saline.  Precautions  Precautions Fall;Other (comment)  Precaution Comments cortrak  Pain Assessment  Pain Assessment Faces  Faces Pain Scale 0  Cognition  Arousal/Alertness Awake/alert;Lethargic  Behavior During Therapy Flat affect;Restless  Overall Cognitive Status Difficult to assess  Current Attention Level Focused  Following Commands Follows one step commands inconsistently;Follows one step commands with increased time  Problem Solving Slow processing;Decreased initiation;Difficulty sequencing;Requires verbal cues;Requires tactile cues  General Comments Video interpreter utilized.  Pt follows one step motor  commands with a delay, ~30% of the time.  Pt appears to be apraxic, coupled with communication defiicts make it difficult to accurately assess cognition   Difficult to assess due to Impaired communication;Non-English speaking  Upper Extremity Assessment  Upper Extremity Assessment RUE deficits/detail  RUE Deficits / Details No active movement or attempts to initiate movement with Rt UE noted this date  RUE Coordination decreased fine motor;decreased gross motor  Lower Extremity Assessment  Lower Extremity Assessment Defer to PT evaluation  ADL  Overall ADL's  Needs assistance/impaired  Grooming Wash/dry face;Wash/dry hands;Total assistance;Sitting  Bed Mobility  Overal bed mobility Needs Assistance  Bed Mobility Rolling;Sidelying to Sit;Sit to Sidelying  Rolling Max assist (rollint to Lt )  Sidelying to sit Max assist  Sit to sidelying Max assist  General bed mobility comments assist with all aspects   Balance  Overall balance assessment Needs assistance  Sitting-balance support Feet supported;No upper extremity supported;Single extremity supported  Sitting balance-Leahy Scale Poor  Sitting balance - Comments Pt demonstrates pusher syndrome, pushing with Lt UE, and requires max A to maintain EOB sitting when pushing.  However, when Lt UE engaged, or in his lap, he is able to maintain EOB sitting with min A for brief periods, before he begins to push   Postural control Left lateral lean;Posterior lean  Transfers  General transfer comment unable to safely attempt with +1 assist   General Comments  General comments (skin integrity, edema, etc.) wife present.  She reports pt is fatigued due to being thirsty.  Explained mechanism of stroke, stroke related fatigue, as well as the amount of energy he  has to expend to perform simple tasks.  Also explained swallowing precautions.    Other Exercises  Other Exercises while EOB worked on facilitation of trunk control, midline positioning, attempted  to engage pt in reaching activiites but difficult to achieve consistently due to communication deficits, and apraxia   OT - End of Session  Activity Tolerance Patient tolerated treatment well  Patient left in bed;with call bell/phone within reach;with bed alarm set;with family/visitor present  Nurse Communication Mobility status  OT Assessment/Plan  OT Plan Discharge plan remains appropriate  OT Visit Diagnosis Unsteadiness on feet (R26.81);Other abnormalities of gait and mobility (R26.89);Muscle weakness (generalized) (M62.81);Pain;Hemiplegia and hemiparesis  Hemiplegia - Right/Left Right  Hemiplegia - dominant/non-dominant Dominant  Hemiplegia - caused by Cerebral infarction  OT Frequency (ACUTE ONLY) Min 2X/week  Follow Up Recommendations CIR;Supervision/Assistance - 24 hour  OT Equipment None recommended by OT  AM-PAC OT "6 Clicks" Daily Activity Outcome Measure (Version 2)  Help from another person eating meals? 1  Help from another person taking care of personal grooming? 2  Help from another person toileting, which includes using toliet, bedpan, or urinal? 1  Help from another person bathing (including washing, rinsing, drying)? 1  Help from another person to put on and taking off regular upper body clothing? 1  Help from another person to put on and taking off regular lower body clothing? 1  6 Click Score 7  OT Goal Progression  Progress towards OT goals Progressing toward goals (slowly )  OT Time Calculation  OT Start Time (ACUTE ONLY) 1525  OT Stop Time (ACUTE ONLY) 1548  OT Time Calculation (min) 23 min  OT General Charges  $OT Visit 1 Visit  OT Treatments  $Neuromuscular Re-education 23-37 mins  Nilsa Nutting., OTR/L Acute Rehabilitation Services Pager [phone] Office [phone]

## 2020-04-14 NOTE — Progress Notes (Signed)
Physical Therapy Treatment Patient Details Name: Brad Delacruz MRN: [id number] DOB: [birth year] Today's Date: 04/14/2020    History of Present Illness 59 y.o. male past medical history of diabetes, hypertension which he does not take medication for, presented to the emergency room at [facility] with last known well of 5 AM when he was at work and was noted to have a sudden onset of right-sided weakness and inability to talk. Pt given TPA and s/p thrombectomy on 4/20.  Was intubated and restrained as of 4/22,  extubated 4/28 and now drowsy.  L MCA revascularization with tPA, acute respiratory failure from possible Asp PNA, now referred to rehab.  PMHx:  DM, HTN,  On 5/2 pt with worsening of bleed to 29m, transferred to ICU and placed on 3% saline.    PT Comments    Patient easily aroused on arrival (was sitting upright in bed; chair-like position with wife present). Patient was able to assist with more of his bed mobility (when set up to use his left extremities for success). He continues with decr awareness of rt hemiparesis and demonstrates pusher syndrome (LUE pushing his torso past midline to fall to his right side). Seated with min to total assist (depending on ability to keep LUE engaged and not pushing himself over) attempting to engage LUE in activities and reaching. Patient able to achieve partial stand with bed elevated and 2 person assist (pt engaging LLE to assist). ? Able to utilize sara-plus to control his torso and more effectively work on standing.   Follow Up Recommendations  CIR;Supervision/Assistance - 24 hour     Equipment Recommendations  Other (comment)(TBD at post-acute venue)    Recommendations for Other Services       Precautions / Restrictions Precautions Precautions: Fall;Other (comment) Precaution Comments: cortrak Restrictions Weight Bearing Restrictions: No Other Position/Activity Restrictions: 30 deg HOB elevation    Mobility  Bed  Mobility Overal bed mobility: Needs Assistance Bed Mobility: Rolling;Sidelying to Sit;Sit to Sidelying Rolling: Mod assist(rollint to Lt ) Sidelying to sit: Mod assist     Sit to sidelying: Max assist;+2 for physical assistance General bed mobility comments: roll from supine to his left side (pt instinctively used lt hand on left rail to assist with pulling over onto his left side); assisted with moving LLE over EOB once initiated, no active movement RLE noted; raised torso from HOB 20 using LUE  Transfers Overall transfer level: Needs assistance Equipment used: (gait belt) Transfers: Sit to/from Stand Sit to Stand: +2 physical assistance;Max assist         General transfer comment: initial attempt did not clear buttocks; 2nd attempt he engaged with buttocks clearing ~4" off bed (elevated bed)  Ambulation/Gait             General Gait Details: unable   Stairs             Wheelchair Mobility    Modified Rankin (Stroke Patients Only) Modified Rankin (Stroke Patients Only) Pre-Morbid Rankin Score: No symptoms Modified Rankin: Severe disability     Balance Overall balance assessment: Needs assistance Sitting-balance support: Feet supported;No upper extremity supported;Single extremity supported Sitting balance-Leahy Scale: Zero Sitting balance - Comments: Pt demonstrates pusher syndrome, pushing with Lt UE, and requires max A to maintain EOB sitting when pushing.  However, when Lt UE engaged, or in his lap, he is able to maintain EOB sitting with min A for brief periods, before he begins to push or lose his balance anteriorly Postural control: Left  lateral lean(multiple anterior LOB)   Standing balance-Leahy Scale: Zero Standing balance comment: did not achieve full standing               High Level Balance Comments: sitting EOB attempting to engage LUE in activities/following commands to reduce his pushing towards his rt side; followed no commands with LUE  (followed other commands, but not LUE); total time EOB ~10-12 minutes            Cognition Arousal/Alertness: Awake/alert;Lethargic(lethargic initially, alert with mobility) Behavior During Therapy: Flat affect;Restless Overall Cognitive Status: Difficult to assess Area of Impairment: Attention;Problem solving;Following commands                   Current Attention Level: Sustained   Following Commands: Follows one step commands inconsistently;Follows one step commands with increased time Safety/Judgement: Decreased awareness of safety;Decreased awareness of deficits Awareness: Intellectual Problem Solving: Slow processing;Decreased initiation;Difficulty sequencing;Requires verbal cues;Requires tactile cues General Comments: Video interpreter, Katharine Look utilized.  Pt follows one step motor commands with a delay ~30% of the time.  "Stand up" "stick out your tongue" (after demonstrated); would not follow commands with LUE even with 15 seconds to process and then command repeated.       Exercises      General Comments General comments (skin integrity, edema, etc.): wife present; denied having questions (via interpreter).       Pertinent Vitals/Pain Pain Assessment: Faces Faces Pain Scale: No hurt    Home Living                      Prior Function            PT Goals (current goals can now be found in the care plan section) Acute Rehab PT Goals Patient Stated Goal: not stated; no attempts to vocalize Time For Goal Achievement: 04/23/20 Potential to Achieve Goals: Fair Progress towards PT goals: Progressing toward goals    Frequency    Min 4X/week      PT Plan Current plan remains appropriate    Co-evaluation              AM-PAC PT "6 Clicks" Mobility   Outcome Measure  Help needed turning from your back to your side while in a flat bed without using bedrails?: A Lot Help needed moving from lying on your back to sitting on the side of a flat  bed without using bedrails?: A Lot Help needed moving to and from a bed to a chair (including a wheelchair)?: Total Help needed standing up from a chair using your arms (e.g., wheelchair or bedside chair)?: Total Help needed to walk in hospital room?: Total Help needed climbing 3-5 steps with a railing? : Total 6 Click Score: 8    End of Session Equipment Utilized During Treatment: Gait belt Activity Tolerance: Patient tolerated treatment well Patient left: in bed;with call bell/phone within reach;with bed alarm set;with family/visitor present(in chair position)   PT Visit Diagnosis: Muscle weakness (generalized) (M62.81);Other symptoms and signs involving the nervous system (R29.898);Hemiplegia and hemiparesis Hemiplegia - Right/Left: Right Hemiplegia - dominant/non-dominant: Dominant Hemiplegia - caused by: Cerebral infarction     Time: 1601-0932 PT Time Calculation (min) (ACUTE ONLY): 23 min  Charges:  $Neuromuscular Re-education: 23-37 mins                      Arby Barrette, PT Pager [phone]    Rexanne Mano 04/14/2020, 2:42 PM

## 2020-04-14 NOTE — Progress Notes (Signed)
Inpatient Rehab Admissions Coordinator:   I met with pt and his wife at bedside, [facility] interpreter used for communication.  I do not have a bed for this patient today.  I did get approval from [facility] for [facility] admission, but it appears family may be pursuing worker's comp. Called [place] with [employer] today and was told they would call me back.  Will continue to follow.   Shann Medal, PT, DPT Admissions Coordinator [phone] 04/14/20  3:44 PM

## 2020-04-14 NOTE — Social Work (Signed)
CSW met with pt at bedside. CSW introduced self and explained her role at the hospital. Pts wife called daughter [name] to translate. [name] stated that pt has not had alcohol in over 40 years and states that he does not do drugs. Pt did not need resources at this time.   Emeterio Reeve, Latanya Presser, [place] Social Worker [phone]

## 2020-04-14 NOTE — Progress Notes (Signed)
STROKE TEAM PROGRESS NOTE   INTERVAL HISTORY Patient is sitting up in bed.  He is awake and follows some simple basic midline and commands to mimicking.  Continues to have aphasia and right hemiparesis.  His wife is at the bedside.  She speaks limited [place].  Vitals:   04/13/20 1932 04/13/20 2329 04/14/20 0318 04/14/20 0739  BP: 110/65 106/75 116/68 (!) 127/58  Pulse: (!) 105 99 89 80  Resp: 18 18 18 18   Temp: 98.3 F (36.8 C) 98.4 F (36.9 C) (!) 97.3 F (36.3 C) 98.5 F (36.9 C)  TempSrc: Oral Oral Axillary Oral  SpO2: 95% 99% 100% 100%  Weight:      Height:        CBC:  Recent Labs  Lab 04/11/20 0232 04/12/20 0435  WBC 7.8 8.2  HGB 12.1* 12.0*  HCT 34.9* 35.7*  MCV 90.4 90.2  PLT 304 285    Basic Metabolic Panel:  Recent Labs  Lab 04/11/20 0232 04/12/20 0435  NA 138 134*  K 3.7 3.9  CL 100 101  CO2 26 26  GLUCOSE 215* 275*  BUN 22* 21*  CREATININE 0.74 0.87  CALCIUM 9.0 9.0   IMAGING past 24 hours No results found.   PHYSICAL EXAM    General -mildly obese middle-aged Hispanic male, not in acute distress  Ophthalmologic - fundi not visualized due to noncooperation.  Cardiovascular - Regular rhythm and rate.  Abdomen - no tenderness on palpitation of abdomen or lower chest, rib  Neuro - eyes open but global aphasia, did not follow  most commands except  Few midline and simple one-step commands Eyes left gaze deviation able to cross midline slightly, not blinking to visual threat on the right, blinking to threat on the left,  able to track on the left but not right. Mild lower right facial droop.  Tongue protrusion not cooperative. Spontaneous movement of LUE and LLE, LUE at least 4/5, no drift when lifted in air. LLE 3/5 with pain stimulation. RUE flaccid, right-sided neglect, RLE slight withdraw to pain. DTR 1+. Right upgoing toe, left downgoing toe. Sensation, coordination and gait not tested.   ASSESSMENT/PLAN Brad Delacruz is a 59 y.o.  male with history of HTN and DB noncompliant w/ medications presenting to [facility] with R sided weakness and inability to talk. NIH 28. CTA showed L M1 occlusion. Transferred to [facility]. [facility]. On arrival, received IV tPA 03/24/2020 at 0725 followed by mechanical thrombectomy w/ TICI2C revascularization and rescue stent placement. Admitted to the ICU. transititioned out with transfer back to neuro ICU 5/1 with worsening edema for hypertonic saline protocol.    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   Therapy recommendations: CIR  Disposition:  pending    Medically ready for d/c to rehab once bed found  Hypertension  Cozaar 50 daily  Hyperlipidemia  On lipitor 80  Diabetes type II Uncontrolled  Noncompliant w/ meds PTA  Lantus 30-> 35 bid, change novolog 5 u bid at 1800 and 0000 for HS TF  coverage  CBGs  SSI 0-15  DM coordinator on board  Dysphagia . Secondary to stroke . Now on dys 1 and honey thick liquid . on nocturnal feeding . Has Cortrak . Speech on board . Dietitian consulted for calore count - keep hs TF . Encourage po intake . Free water - 100 q6h   Other Stroke Risk Factors  Hx ETOH use  Substance abuse - UDS:  [place] POSITIVE  Overweight, Body mass index is 29.5 kg/m., recommend weight loss, diet and exercise as appropriate   Other Active Problems  Code status - Full code  Urinary retention - I&O cath, foley prn  Pt seems to hold his stomach with grimace while working with PT/OT. Wife said pt had rib fracture in the past. CXR and KUB  unremarkable.   Patient neurologically remains stable.  Encourage oral intake during the day.  Continue ongoing therapies.  Medically stable to be transferred to inpatient rehab when bed available.  Apparently the inpatient rehab unit at [facility] is backlogged and may take several days and she will need to discuss with family considering inpatient rehab options outside in [place].  Discussed with speech therapy, patient and wife and answered questions.  Greater than 50% time during this 25-minute visit was spent on counseling and coordination of care and discussion with patient and wife and answering questions Will consider discontinuing core track tube if patient is eating well enough to maintain his nutrition. Antony Contras, MD Medical Director [place] Pager: [phone] 04/14/2020 10:37 AM Hospital day # 21      To contact Stroke Continuity provider, please refer to [email or web address] After hours, contact General Neurology35

## 2020-04-15 ENCOUNTER — Inpatient Hospital Stay (HOSPITAL_COMMUNITY): Payer: BC Managed Care – PPO

## 2020-04-15 LAB — GLUCOSE, CAPILLARY
Glucose-Capillary: 221 mg/dL — ABNORMAL HIGH (ref 70–99)
Glucose-Capillary: 172 mg/dL — ABNORMAL HIGH (ref 70–99)
Glucose-Capillary: 231 mg/dL — ABNORMAL HIGH (ref 70–99)
Glucose-Capillary: 232 mg/dL — ABNORMAL HIGH (ref 70–99)
Glucose-Capillary: 277 mg/dL — ABNORMAL HIGH (ref 70–99)
Glucose-Capillary: 181 mg/dL — ABNORMAL HIGH (ref 70–99)

## 2020-04-15 LAB — URINE CULTURE

## 2020-04-15 MED ORDER — ATORVASTATIN CALCIUM 80 MG PO TABS
80.0000 mg | ORAL_TABLET | Freq: Every day | ORAL | Status: DC
  Administered 2020-04-16: 80 mg via ORAL
  Filled 2020-04-15: qty 1

## 2020-04-15 MED ORDER — ACETAMINOPHEN 325 MG PO TABS
650.0000 mg | ORAL_TABLET | Freq: Four times a day (QID) | ORAL | Status: DC | PRN
  Administered 2020-04-16 (×2): 650 mg via ORAL
  Filled 2020-04-15 (×2): qty 2

## 2020-04-15 MED ORDER — TICAGRELOR 90 MG PO TABS
90.0000 mg | ORAL_TABLET | Freq: Two times a day (BID) | ORAL | Status: DC
  Administered 2020-04-15 – 2020-04-16 (×2): 90 mg via ORAL
  Filled 2020-04-15 (×2): qty 1

## 2020-04-15 MED ORDER — LOSARTAN POTASSIUM 50 MG PO TABS
50.0000 mg | ORAL_TABLET | Freq: Every day | ORAL | Status: DC
  Administered 2020-04-16: 50 mg via ORAL
  Filled 2020-04-15: qty 1

## 2020-04-15 MED ORDER — ASPIRIN 81 MG PO CHEW
81.0000 mg | CHEWABLE_TABLET | Freq: Every day | ORAL | Status: DC
  Administered 2020-04-16: 81 mg via ORAL
  Filled 2020-04-15: qty 1

## 2020-04-15 MED ORDER — AMANTADINE HCL 50 MG/5ML PO SYRP
100.0000 mg | ORAL_SOLUTION | Freq: Two times a day (BID) | ORAL | Status: DC
  Administered 2020-04-15 – 2020-04-16 (×2): 100 mg via ORAL
  Filled 2020-04-15 (×2): qty 10

## 2020-04-15 MED ORDER — ACETAMINOPHEN 160 MG/5ML PO SOLN: 650.0000 mg | Freq: Four times a day (QID) | ORAL | Status: DC | PRN

## 2020-04-15 MED ORDER — PANTOPRAZOLE SODIUM 40 MG PO PACK
40.0000 mg | PACK | Freq: Every day | ORAL | Status: DC
  Administered 2020-04-15: 40 mg via ORAL
  Filled 2020-04-15: qty 20

## 2020-04-15 MED ORDER — INSULIN ASPART 100 UNIT/ML ~~LOC~~ SOLN
0.0000 [IU] | Freq: Three times a day (TID) | SUBCUTANEOUS | Status: DC
  Administered 2020-04-16: 3 [IU] via SUBCUTANEOUS
  Administered 2020-04-16: 8 [IU] via SUBCUTANEOUS

## 2020-04-15 MED ORDER — OSMOLITE 1.5 CAL PO LIQD
720.0000 mL | ORAL | Status: DC
  Filled 2020-04-15: qty 948

## 2020-04-15 NOTE — Progress Notes (Signed)
Pt no longer on tube feeds. Verbal order from [facility] to switch to ACHS moderate coverage and to hold tonight's lantus and 5 units scheduled Novolog.

## 2020-04-15 NOTE — Progress Notes (Signed)
Physical Therapy Treatment Patient Details Name: Brad Delacruz MRN: [id number] DOB: 05-06-1961 Today's Date: 04/15/2020    History of Present Illness 59 y.o. male past medical history of diabetes, hypertension which he does not take medication for, presented to the emergency room at [facility] with last known well of 5 AM when he was at work and was noted to have a sudden onset of right-sided weakness and inability to talk. Pt given TPA and s/p thrombectomy on 4/20.  Was intubated and restrained as of 4/22,  extubated 4/28 and now drowsy.  L MCA revascularization with tPA, acute respiratory failure from possible Asp PNA, now referred to rehab.  PMHx:  DM, HTN,  On 5/2 pt with worsening of bleed to 67m, transferred to ICU and placed on 3% saline.    PT Comments    Patient seen for mobility progression. This session focused on sitting balance EOB and functional transfer training. Pt able to stand X1 trial with +2 assist. Pt continues to present with R hemiplegia and follows commands inconsistently. Pt will need further visual assessment.  Continue to recommend CIR for further skilled PT services.   Follow Up Recommendations  CIR;Supervision/Assistance - 24 hour     Equipment Recommendations  Other (comment)(TBD at post-acute venue)    Recommendations for Other Services       Precautions / Restrictions Precautions Precautions: Fall Precaution Comments: cortrak Restrictions Weight Bearing Restrictions: No    Mobility  Bed Mobility Overal bed mobility: Needs Assistance Bed Mobility: Rolling;Sidelying to Sit;Sit to Sidelying Rolling: Mod assist Sidelying to sit: Max assist;+2 for safety/equipment     Sit to sidelying: Total assist;+2 for physical assistance General bed mobility comments: multimodal cues for sequencing; use of rail with L UE  Transfers Overall transfer level: Needs assistance   Transfers: Sit to/from Stand;Lateral/Scoot Transfers Sit to Stand: Max  assist;+2 physical assistance        Lateral/Scoot Transfers: Max assist;+2 physical assistance General transfer comment: R knee blocked and R UE supported; pt able to stand with use of bed pad to factilitate hip extension and maintain standing briefly; lateral scoot to [facility] with bed pad   Ambulation/Gait                 Stairs             Wheelchair Mobility    Modified Rankin (Stroke Patients Only) Modified Rankin (Stroke Patients Only) Pre-Morbid Rankin Score: No symptoms Modified Rankin: Severe disability     Balance Overall balance assessment: Needs assistance Sitting-balance support: Feet supported;No upper extremity supported;Single extremity supported Sitting balance-Leahy Scale: Zero Sitting balance - Comments: pt requires varying levels of assist to maintain sitting balance EOB as pt pushes with L UE; requires tactile cues/assist for L elbow flexion; worked on lateral trunk flexion and rotation; attempted use of mirror for midline position however pt unable to focus on mirror  Postural control: Right lateral lean(pushes with L UE)   Standing balance-Leahy Scale: Zero                              Cognition Arousal/Alertness: Awake/alert Behavior During Therapy: Flat affect Overall Cognitive Status: Difficult to assess Area of Impairment: Attention;Problem solving;Following commands                   Current Attention Level: Sustained   Following Commands: Follows one step commands inconsistently;Follows one step commands with increased time Safety/Judgement: Decreased  awareness of safety;Decreased awareness of deficits   Problem Solving: Slow processing;Decreased initiation;Difficulty sequencing;Requires verbal cues;Requires tactile cues General Comments: Video interpreter utilized; pt responded with head nods inconsistently and no attempts to verbalize during session; pt demonstrates visual deficits and wife reports pt is seeing  double; blinking often during session and when attempting to find object on R side closes L eye      Exercises      General Comments        Pertinent Vitals/Pain Pain Assessment: Faces Faces Pain Scale: No hurt Pain Intervention(s): Monitored during session    Home Living                      Prior Function            PT Goals (current goals can now be found in the care plan section) Progress towards PT goals: Progressing toward goals    Frequency    Min 4X/week      PT Plan Current plan remains appropriate    Co-evaluation PT/OT/SLP Co-Evaluation/Treatment: Yes Reason for Co-Treatment: Complexity of the patient's impairments (multi-system involvement);Necessary to address cognition/behavior during functional activity;For patient/therapist safety;To address functional/ADL transfers PT goals addressed during session: Mobility/safety with mobility;Balance        AM-PAC PT "6 Clicks" Mobility   Outcome Measure  Help needed turning from your back to your side while in a flat bed without using bedrails?: A Lot Help needed moving from lying on your back to sitting on the side of a flat bed without using bedrails?: A Lot Help needed moving to and from a bed to a chair (including a wheelchair)?: Total Help needed standing up from a chair using your arms (e.g., wheelchair or bedside chair)?: Total Help needed to walk in hospital room?: Total Help needed climbing 3-5 steps with a railing? 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

## 2020-04-15 NOTE — Progress Notes (Signed)
Nutrition Follow-up  DOCUMENTATION CODES:   Not applicable  INTERVENTION:  -Continue Magic cup TID with meals, each supplement provides 290 kcal and 9 grams of protein -Continue feeding assistance with meals -d/c Cortrak and nocturnal feeding as pt now eating 75-95% of meals   NUTRITION DIAGNOSIS:   Inadequate oral intake related to inability to eat as evidenced by NPO status.  Progressing, pt now on Dysphagia 1 diet with Honey Thick liquids.  GOAL:   Patient will meet greater than or equal to 90% of their needs  Progressing.  MONITOR:   TF tolerance, Labs  REASON FOR ASSESSMENT:   Consult Enteral/tube feeding initiation and management  ASSESSMENT:   Pt with PMH of DM, HTN which he does not take medication for admitted with L MCA s/p IR for thrombectomy and stent on 4/20.  4/22 NG placed and started on TF 4/23 Cortrak placed; tip gastric  4/25 intubated  4/28 extubated 4/29 not ready for SLP eval; cortrak remains gastric 5/1 tx back to ICU for 3% hypertonic saline  5/4 not ready for swallow study 5/5 MBS completed, Dysphagia 1 with Honey thick liquids  RD approached by RN regarding nocturnal tube feeding as tube feeding orders were unclear (total amount to be infused did not match run time described). RN states that this was cleared up with pharmacy.   PO Intake: 75-95% x last 8 recorded meals (81% average meal intake)  Current tube feeding orders: Osmolite 1.5 cal @ 56ml/hr for 12 hours from 1800-0600 with free water Q6H.  Since pt's PO intake has improved, may d/c Cortrak and nocturnal tube feeding. Discussed with MD.   Labs: Na 134 (L), CBGs [phone] Medications reviewed and include: Novolog, Lantus  Diet Order:   Diet Order            DIET - DYS 1 Room service appropriate? No; Fluid consistency: Honey Thick  Diet effective now              EDUCATION NEEDS:   No education needs have been identified at this time  Skin:  Skin  Assessment: Skin Integrity Issues: Skin Integrity Issues:: Other (Comment) Other: MASD scrotum  Last BM:  5/11  Height:   Ht Readings from Last 1 Encounters:  03/30/20 5\' 5"  (1.651 m)    Weight:   Wt Readings from Last 1 Encounters:  04/15/20 80.9 kg   BMI:  Body mass index is 29.68 kg/m.  Estimated Nutritional Needs:   Kcal:  2000-2200  Protein:  105-125 grams  Fluid:  > 2 L/day   06/15/20, MS, RD, LDN RD pager number and weekend/on-call pager number located in Amion.

## 2020-04-15 NOTE — Progress Notes (Signed)
Inpatient Rehab Admissions Coordinator:   I have no beds available on CIR today.  I do have insurance authorization and will plan to admit once a bed becomes available.    Estill Dooms, PT, DPT Admissions Coordinator [phone] 04/15/20  10:30 AM

## 2020-04-15 NOTE — Progress Notes (Signed)
STROKE TEAM PROGRESS NOTE   INTERVAL HISTORY Patient is sitting up in bed.  He is sleepy but can be aroused.  He remains aphasic with right hemiplegia.  His wife is at the bedside.  I also spoke to his daughter over the phone  Vitals:   04/15/20 0322 04/15/20 0344 04/15/20 0822 04/15/20 1119  BP: 125/78  130/85 114/61  Pulse: 87  84 92  Resp: 18  18 16   Temp: 98.1 F (36.7 C)  98.5 F (36.9 C) 98.1 F (36.7 C)  TempSrc: Axillary  Axillary Axillary  SpO2: 100%  98% 99%  Weight:  80.9 kg    Height:        CBC:  Recent Labs  Lab 04/11/20 0232 04/12/20 0435  WBC 7.8 8.2  HGB 12.1* 12.0*  HCT 34.9* 35.7*  MCV 90.4 90.2  PLT 304 285    Basic Metabolic Panel:  Recent Labs  Lab 04/11/20 0232 04/12/20 0435  NA 138 134*  K 3.7 3.9  CL 100 101  CO2 26 26  GLUCOSE 215* 275*  BUN 22* 21*  CREATININE 0.74 0.87  CALCIUM 9.0 9.0   IMAGING past 24 hours No results found.   PHYSICAL EXAM      General -mildly obese middle-aged Hispanic male, not in acute distress  Ophthalmologic - fundi not visualized due to noncooperation.  Cardiovascular - Regular rhythm and rate.  Abdomen - no tenderness on palpitation of abdomen or lower chest, rib  Neuro - eyes open but global aphasia, did not follow  most commands except  Few midline and simple one-step commands Eyes left gaze deviation able to cross midline slightly, not blinking to visual threat on the right, blinking to threat on the left,  able to track on the left but not right. Mild lower right facial droop.  Tongue protrusion not cooperative. Spontaneous movement of LUE and LLE, LUE at least 4/5, no drift when lifted in air. LLE 3/5 with pain stimulation. RUE flaccid, right-sided neglect, RLE slight withdraw to pain. DTR 1+. Right upgoing toe, left downgoing toe. Sensation, coordination and gait not tested.   ASSESSMENT/PLAN [name] is a 59 y.o. male with history of HTN and DB noncompliant w/ medications  presenting to [facility] with R sided weakness and inability to talk. NIH 28. CTA showed L M1 occlusion. Transferred to [facility]. On arrival, received IV tPA 03/24/2020 at 0725 followed by mechanical thrombectomy w/ TICI2C revascularization and rescue stent placement. Admitted to the ICU. transititioned out with transfer back to neuro ICU 5/1 with worsening edema for hypertonic saline protocol.    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   Therapy recommendations: CIR  Disposition:  pending    Medically ready for d/c to rehab once bed found  Hypertension  Cozaar 50 daily  Hyperlipidemia  On lipitor 80  Diabetes type II Uncontrolled  Noncompliant w/ meds PTA  Lantus 30-> 35 bid, change novolog 5 u bid at 1800 and 0000 for HS TF coverage  CBGs  SSI  0-15  DM coordinator on board  Dysphagia . Secondary to stroke . Now on dys 1 and honey thick liquid . on nocturnal feeding . Has Cortrak . Speech on board . Dietitian consulted for calore count - keep hs TF . Encourage po intake . Free water - 100 q6h   Other Stroke Risk Factors  Hx ETOH use  Substance abuse - UDS:  Benzos POSITIVE  Overweight, Body mass index is 29.68 kg/m., recommend weight loss, diet and exercise as appropriate   Other Active Problems  Code status - Full code  Urinary retention - I&O cath, foley prn  Pt seems to hold his stomach with grimace while working with PT/OT. Wife said pt had rib fracture in the past. CXR and KUB unremarkable.   Hospital D# 22   Patient is yet awaiting  bed in inpatient rehab.  I spoke to his daughter Brad Delacruz over the phone and the patient's wife about alternatives of inpatient rehab [place] [facility] [place] to be considered since there is lack of beds in inpatient rehab at [facility] and it may take more than a week.  They also offered possibility of going to rehab in a skilled nursing facility in [place] since they prefer not to move out of [place] however they declined this.  Discussed with Education officer, museum .  Encourage oral intake during the day.  Continue ongoing therapies.  Medically stable to be transferred to inpatient rehab when bed available.   Greater than 50% time during this 25-minute visit was spent on counseling and coordination of care and discussion with patient and wife and answering questions Will consider discontinuing core track tube if patient is eating well enough to maintain his nutrition. Brad Contras, MD Medical Director [place] Pager: [phone] 04/15/2020 12:13 PM Hospital day # 22      To contact Stroke Continuity provider, please refer to [email or web address] After hours, contact General Neurology35

## 2020-04-15 NOTE — Progress Notes (Addendum)
Occupational Therapy Treatment Patient Details Name: Job Holtsclaw MRN: [id number] DOB: Apr 21, 1961 Today's Date: 04/15/2020    History of present illness 59 y.o. male past medical history of diabetes, hypertension which he does not take medication for, presented to the emergency room at [facility] with last known well of 5 AM when he was at work and was noted to have a sudden onset of right-sided weakness and inability to talk. Pt given TPA and s/p thrombectomy on 4/20.  Was intubated and restrained as of 4/22,  extubated 4/28 and now drowsy.  L MCA revascularization with tPA, acute respiratory failure from possible Asp PNA, now referred to rehab.  PMHx:  DM, HTN,  On 5/2 pt with worsening of bleed to 76m, transferred to ICU and placed on 3% saline.   OT comments  Pt continuing to progress toward stated goals, focused session on BADL mobility with neuro facilitory techniques. Interpreter Ephriam Knuckles [phone] used for session with wife present. Pt completed bed mobility at max A +2. Once EOB, pt pushes significant with LUE, but this is lessened when LUE engaged. Attempted to use mirror for visual feedback- pt not able to attend to mirror. Continue to suspect visual involvement due to pt closing L eye and having difficulty visually attending. Sit<> stand attempted with max A +2, having difficulty attaining full hip extension. Lateral trunk flexion completed in sitting both L and R to facilitate normal muscle patterns. CIR remains appropriate recommendation for d/c. Will continue to follow.    Follow Up Recommendations  CIR;Supervision/Assistance - 24 hour    Equipment Recommendations  None recommended by OT    Recommendations for Other Services Rehab consult    Precautions / Restrictions Precautions Precautions: Fall Precaution Comments: cortrak Restrictions Weight Bearing Restrictions: No       Mobility Bed Mobility Overal bed mobility: Needs Assistance Bed Mobility:  Rolling;Sidelying to Sit;Sit to Sidelying Rolling: Mod assist Sidelying to sit: Max assist;+2 for safety/equipment     Sit to sidelying: Total assist;+2 for physical assistance General bed mobility comments: multimodal cues for sequencing; use of rail with L UE. Pt able to initiate with LUE as well  Transfers Overall transfer level: Needs assistance Equipment used: 2 person hand held assist Transfers: Sit to/from Stand Sit to Stand: Max assist;+2 physical assistance        Lateral/Scoot Transfers: Max assist;+2 physical assistance General transfer comment: R knee blocked and R UE supported; pt able to stand with use of bed pad to factilitate hip extension and maintain standing briefly; lateral scoot to [facility] with bed pad     Balance Overall balance assessment: Needs assistance Sitting-balance support: Feet supported;No upper extremity supported;Single extremity supported Sitting balance-Leahy Scale: Zero Sitting balance - Comments: pt requires varying levels of assist to maintain sitting balance EOB as pt pushes with L UE; requires tactile cues/assist for L elbow flexion; worked on lateral trunk flexion and rotation; attempted use of mirror for midline position however pt unable to focus on mirror  Postural control: Right lateral lean(pushing with LUE)   Standing balance-Leahy Scale: Zero Standing balance comment: did not achieve full standing                           ADL either performed or assessed with clinical judgement   ADL Overall ADL's : Needs assistance/impaired Eating/Feeding: Moderate assistance;Sitting;Cueing for sequencing;Cueing for safety Eating/Feeding Details (indicate cue type and reason): pt sitting EOB, initially denying food by shaking head  no. Pt has difficulty with apraxia and self feeding, needing multimodal cues to initiate hand to mouth pattern                                 Functional mobility during ADLs: Maximal assistance;+2  for physical assistance;Total assistance General ADL Comments: session focused on attaining safe upright posture EOB to decrease pushing     Vision   Additional Comments: pt continues to close L eye. Wife apparently asked pt if he had double vision and he stated yes. WIll continue to assess and trial occlusion as appropriate. Pt difficulty visually attending and focusing throughout session   Perception     Praxis      Cognition Arousal/Alertness: Awake/alert Behavior During Therapy: Flat affect Overall Cognitive Status: Difficult to assess Area of Impairment: Attention;Problem solving;Following commands;Safety/judgement                   Current Attention Level: Sustained   Following Commands: Follows one step commands inconsistently;Follows one step commands with increased time Safety/Judgement: Decreased awareness of safety;Decreased awareness of deficits   Problem Solving: Slow processing;Decreased initiation;Difficulty sequencing;Requires verbal cues;Requires tactile cues General Comments: pt inconsistently responding with head nods to simple tasks. Pt having difficulty maintaining attention throughout tasks. Not vocalizing at all in session        Exercises     Shoulder Instructions       General Comments      Pertinent Vitals/ Pain       Pain Assessment: Faces Faces Pain Scale: No hurt Pain Intervention(s): Monitored during session  Home Living                                          Prior Functioning/Environment              Frequency  Min 2X/week        Progress Toward Goals  OT Goals(current goals can now be found in the care plan section)  Progress towards OT goals: Progressing toward goals  Acute Rehab OT Goals Patient Stated Goal: not stated; no attempts to vocalize OT Goal Formulation: With patient/family Time For Goal Achievement: 04/29/20 Potential to Achieve Goals: Fair  Plan Discharge plan remains  appropriate    Co-evaluation    PT/OT/SLP Co-Evaluation/Treatment: Yes Reason for Co-Treatment: For patient/therapist safety;To address functional/ADL transfers;Complexity of the patient's impairments (multi-system involvement) PT goals addressed during session: Mobility/safety with mobility;Balance OT goals addressed during session: ADL's and self-care;Strengthening/ROM      AM-PAC OT "6 Clicks" Daily Activity     Outcome Measure   Help from another person eating meals?: A Lot Help from another person taking care of personal grooming?: A Lot Help from another person toileting, which includes using toliet, bedpan, or urinal?: Total Help from another person bathing (including washing, rinsing, drying)?: Total Help from another person to put on and taking off regular upper body clothing?: Total Help from another person to put on and taking off regular lower body clothing?: Total 6 Click Score: 8    End of Session    OT Visit Diagnosis: Unsteadiness on feet (R26.81);Other abnormalities of gait and mobility (R26.89);Muscle weakness (generalized) (M62.81);Pain;Hemiplegia and hemiparesis;Cognitive communication deficit (R41.841) Symptoms and signs involving cognitive functions: Cerebral infarction Hemiplegia - Right/Left: Right Hemiplegia - dominant/non-dominant: Dominant Hemiplegia - caused by: Cerebral  infarction   Activity Tolerance Patient tolerated treatment well   Patient Left in bed;with call bell/phone within reach;with bed alarm set;with family/visitor present   Nurse Communication Mobility status        Time: 2111-5520 OT Time Calculation (min): 24 min  Charges: OT General Charges $OT Visit: 1 Visit OT Treatments $Neuromuscular Re-education: 8-22 mins  Zenovia Jarred, MSOT, OTR/L [place] [facility] Office Number: [phone] Pager: [place] 04/15/2020, 5:50 PM

## 2020-04-15 NOTE — PMR Pre-admission (Signed)
PMR Admission Coordinator Pre-Admission Assessment  Patient: Brad Delacruz is an 59 y.o., male MRN: [id number] DOB: 1961-02-09 Height: _0  (165.1 cm) Weight: 75.6 kg              Insurance Information HMO:     PPO: yes     PCP:      IPA:      80/20:      OTHER:  PRIMARY: [employer] [place]      Policy#: [id number]      Subscriber: pt CM Name: Amy      Phone#: [phone]     Fax#: [phone] Pre-Cert#: [id number] auth for CIR provided by Amy with [employer] of [place], updates due 7 days from admission on 04/16/2020 to fax listed above.       Employer: n/a Benefits:  Phone #: [phone]     Name:  Eff. Date: 09/04/18     Deduct: $500 (met $500)      Out of Pocket Max: $5000 (met $1680.30)      Life Max: n/a  CIR: 70%      SNF: 70% Outpatient: 70%     Co-Ins: 30% Home Health: 70%      Co-Ins: 30% DME: 70%     Co-Ins: 30% Providers: preferred network   SECONDARY:       Policy#:       Phone#:   Development worker, community:       Phone#:   The Engineer, petroleum" for patients in Inpatient Rehabilitation Facilities with attached "Privacy Act [place] Records" was provided and verbally reviewed with: N/A  Emergency Contact Information Contact Information    Name Relation Home Work Mobile   [employer] Spouse [phone]     Daeshon, Grammatico Daughter   [phone]     Current Medical History  Patient Admitting Diagnosis: L MCA CVA  History of Present Illness: Brad Delacruz is a 59 year old right-handed male history of diabetes mellitus and hypertension. Presented 03/24/2020 with right-sided weakness and aphasia to [facility]  Admission chemistries with alcohol negative, sodium 130, potassium 3.3, glucose 309, hemoglobin A1c 9.2, urine drug screen positive benzos.  Cranial CT scan negative for acute changes.  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  CT cerebral perfusion scan as well as CT angiogram of head and neck showed a large left  MCA territory nonhemorrhagic infarction as well as acute left M1 large vessel occlusion.  Large penumbra with mismatch volume of 99 mL.  Patient was transferred to [facility].  Patient received TPA, followed by mechanical thrombectomy per interventional radiology.  Echocardiogram with ejection fraction of 07% grade 1 diastolic dysfunction.  TCD bubble study was positive for small PFO and lower extremity Dopplers negative for DVT.  Patient remained intubated through 04/01/2020.  Latest follow-up cranial CT scan 04/08/2020 shows evolving large left MCA territory infarct with similar mass-effect, but no new hemorrhage or hydrocephalus.  Maintained on low-dose aspirin as well as Brilinta for CVA prophylaxis.  Considerations to be made for TEE and loop recorder as outpatient.  Subcutaneous heparin for DVT prophylaxis.  Cortrak placed for nutritional support, now receiving supplemental nutrition nocturnally and tolerating dysphagia #1 diet with honey thick liquid.  He did complete a course of Ancef 03/31/2020 to 04/05/2020 for pneumonia.  Therapy evaluations completed and patient was recommended for a comprehensive rehab program.  Complete NIHSS TOTAL: 19 Glasgow Coma Scale Score: 11  Past Medical History  Past Medical History:  Diagnosis Date  .  Diabetes mellitus without complication (HCC)     Family History  family history is not on file.  Prior Rehab/Hospitalizations:  Has the patient had prior rehab or hospitalizations prior to admission? No  Has the patient had major surgery during 100 days prior to admission? No  Current Medications   Current Facility-Administered Medications:  .  acetaminophen (TYLENOL) tablet 650 mg, 650 mg, Oral, Q6H PRN, 650 mg at 04/16/20 0811 **OR** acetaminophen (TYLENOL) 160 MG/5ML solution 650 mg, 650 mg, Oral, Q6H PRN, Garvin Fila, MD .  amantadine (SYMMETREL) 50 MG/5ML solution 100 mg, 100 mg, Oral, BID, Garvin Fila, MD, 100 mg at 04/16/20 0916 .   aspirin chewable tablet 81 mg, 81 mg, Oral, Daily, Garvin Fila, MD, 81 mg at 04/16/20 0912 .  atorvastatin (LIPITOR) tablet 80 mg, 80 mg, Oral, Daily, Garvin Fila, MD, 80 mg at 04/16/20 0912 .  cefTRIAXone (ROCEPHIN) 1 g in sodium chloride 0.9 % 100 mL IVPB, 1 g, Intravenous, Q24H, Garvin Fila, MD, Last Rate: 200 mL/hr at 04/15/20 1744, 1 g at 04/15/20 1744 .  chlorhexidine (PERIDEX) 0.12 % solution 15 mL, 15 mL, Mouth/Throat, BID, Garvin Fila, MD, 15 mL at 04/16/20 0911 .  Chlorhexidine Gluconate Cloth 2 % PADS 6 each, 6 each, Topical, Daily, Kipp Brood, MD, 6 each at 04/16/20 0914 .  feeding supplement (ENSURE ENLIVE) (ENSURE ENLIVE) liquid 237 mL, 237 mL, Oral, BID BM, Garvin Fila, MD, 237 mL at 04/16/20 0919 .  Gerhardt's butt cream, , Topical, PRN, Garvin Fila, MD, Given at 04/03/20 1710 .  haloperidol lactate (HALDOL) injection 2 mg, 2 mg, Intravenous, Q8H PRN, Garvin Fila, MD .  heparin injection 5,000 Units, 5,000 Units, Subcutaneous, Q8H, Garvin Fila, MD, 5,000 Units at 04/16/20 5009 .  hydrALAZINE (APRESOLINE) injection 5-10 mg, 5-10 mg, Intravenous, Q4H PRN, Kipp Brood, MD, 10 mg at 04/07/20 1011 .  insulin aspart (novoLOG) injection 0-15 Units, 0-15 Units, Subcutaneous, TID WC, Aroor, Lanice Schwab, MD, 3 Units at 04/16/20 0641 .  insulin glargine (LANTUS) injection 35 Units, 35 Units, Subcutaneous, BID, Donzetta Starch, NP, 35 Units at 04/16/20 0916 .  losartan (COZAAR) tablet 50 mg, 50 mg, Oral, Daily, Garvin Fila, MD, 50 mg at 04/16/20 0912 .  MEDLINE mouth rinse, 15 mL, Mouth Rinse, q12n4p, Agarwala, Ravi, MD, 15 mL at 04/15/20 1735 .  pantoprazole sodium (PROTONIX) 40 mg/20 mL oral suspension 40 mg, 40 mg, Oral, QHS, Garvin Fila, MD, 40 mg at 04/15/20 2248 .  Resource [employer], , Oral, PRN, Rosalin Hawking, MD .  ticagrelor [facility]) tablet 90 mg, 90 mg, Oral, BID, Garvin Fila, MD, 90 mg at 04/16/20 3818  Patients Current Diet:   Diet Order            DIET - DYS 1 Room service appropriate? No; Fluid consistency: Honey Thick  Diet effective now              Precautions / Restrictions Precautions Precautions: Fall Precaution Comments: cortrak Restrictions Weight Bearing Restrictions: No Other Position/Activity Restrictions: 30 deg HOB elevation   Has the patient had 2 or more falls or a fall with injury in the past year?No  Prior Activity Level Community (5-7x/wk): working FT, driving, no DME used prior to admission  Prior Functional Level Prior Function Level of Independence: Independent Comments: maintenance job  Self Care: Did the patient need help bathing, dressing, using the toilet or eating?  Independent  Indoor Mobility: Did the patient need assistance with walking from room to room (with or without device)? Independent  Stairs: Did the patient need assistance with internal or external stairs (with or without device)? Independent  Functional Cognition: Did the patient need help planning regular tasks such as shopping or remembering to take medications? Independent  Home Assistive Devices / Equipment Home Assistive Devices/Equipment: None Home Equipment: Crutches  Prior Device Use: Indicate devices/aids used by the patient prior to current illness, exacerbation or injury? None of the above  Current Functional Level Cognition  Arousal/Alertness: Lethargic Overall Cognitive Status: Difficult to assess Difficult to assess due to: Impaired communication, Non-English speaking Current Attention Level: Sustained Orientation Level: Other (comment)(UTA) Following Commands: Follows one step commands inconsistently, Follows one step commands with increased time Safety/Judgement: Decreased awareness of safety, Decreased awareness of deficits General Comments: daughter interpreted during session; pt did not attempt to verbalize Attention: Sustained Sustained Attention: Impaired Sustained  Attention Impairment: Functional basic    Extremity Assessment (includes Sensation/Coordination)  Upper Extremity Assessment: RUE deficits/detail RUE Deficits / Details: No active movement or attempts to initiate movement with Rt UE noted this date RUE Coordination: decreased fine motor, decreased gross motor  Lower Extremity Assessment: Defer to PT evaluation RLE Deficits / Details: dense weakness with no initiation of any kind LLE Deficits / Details: extends his hip on command only    ADLs  Overall ADL's : Needs assistance/impaired Eating/Feeding: Moderate assistance, Sitting, Cueing for sequencing, Cueing for safety Eating/Feeding Details (indicate cue type and reason): pt sitting EOB, initially denying food by shaking head no. Pt has difficulty with apraxia and self feeding, needing multimodal cues to initiate hand to mouth pattern Grooming: Wash/dry face, Wash/dry hands, Total assistance, Sitting Grooming Details (indicate cue type and reason): pt given cues then cloth brought to face adn pt wiped out his eyes Lower Body Dressing: Total assistance Toileting- Clothing Manipulation and Hygiene: Total assistance Toileting - Clothing Manipulation Details (indicate cue type and reason): Pt standing and indicating through gestures, increased restlessness that he needed something prior to his catheter busting and urine pouring on floor Functional mobility during ADLs: Maximal assistance, +2 for physical assistance, Total assistance General ADL Comments: session focused on attaining safe upright posture EOB to decrease pushing    Mobility  Overal bed mobility: Needs Assistance Bed Mobility: Rolling, Sidelying to Sit, Sit to Sidelying Rolling: Mod assist Sidelying to sit: Max assist Supine to sit: +2 for physical assistance, Max assist Sit to supine: Max assist, +2 for physical assistance Sit to sidelying: Total assist, +2 for physical assistance General bed mobility comments: multimodal  cues for sequencing; use of rail with L UE; assist to bring bilat LE/hips to EOB with bed pad and elevate trunk into sitting    Transfers  Overall transfer level: Needs assistance Equipment used: 2 person hand held assist Transfer via Lift Equipment: Stedy Transfers: Sit to/from Stand Sit to Stand: Max assist, +2 physical assistance  Lateral/Scoot Transfers: Max assist, +2 physical assistance General transfer comment: stood X 2 from EOB; assist to power up into standing, support R UE, and maintain upright midline posture; pt requires tactiles cues/assist to flex L knee and elbow as pt pushing to R side on standing frame    Ambulation / Gait / Stairs / Wheelchair Mobility  Ambulation/Gait General Gait Details: unable    Posture / Balance Dynamic Sitting Balance Sitting balance - Comments: pt requires varying levels of assist to maintain sitting balance EOB as pt pushes with  L UE; requires tactile cues/assist for L elbow flexion; worked on lateral trunk flexion and rotation; attempted use of mirror for midline position however pt unable to focus on mirror  Balance Overall balance assessment: Needs assistance Sitting-balance support: Feet supported, Single extremity supported Sitting balance-Leahy Scale: Zero Sitting balance - Comments: pt requires varying levels of assist to maintain sitting balance EOB as pt pushes with L UE; requires tactile cues/assist for L elbow flexion; worked on lateral trunk flexion and rotation; attempted use of mirror for midline position however pt unable to focus on mirror  Postural control: Right lateral lean(pushing with LUE) Standing balance support: Bilateral upper extremity supported Standing balance-Leahy Scale: Zero Standing balance comment: did not achieve full standing High Level Balance Comments: sitting EOB attempting to engage LUE in activities/following commands to reduce his pushing towards his rt side; followed no commands with LUE (followed other  commands, but not LUE); total time EOB ~10-12 minutes    Special needs/care consideration Continuous Drip IV  rocephin 200 mL/hr, Diabetic management yes, Special service needs cortrak for supplemental nutrition and Designated visitor wife Lauree Chandler, daughter Sydell Axon   Previous Environmental health practitioner (from acute therapy documentation) Living Arrangements: Spouse/significant other, Children  Lives With: Family Available Help at Discharge: Family, Available 24 hours/day Type of Home: House Home Layout: One level Home Access: Stairs to enter Entrance Stairs-Rails: None Entrance Stairs-Number of Steps: 2-3 back steps Bathroom Shower/Tub: Chiropodist: Standard Home Care Services: No Additional Comments: information of home from chart as family is gone and pt not very alert  Discharge Living Setting Plans for Discharge Living Setting: Patient's home Type of Home at Discharge: House Discharge Home Layout: One level Discharge Home Access: Stairs to enter Entrance Stairs-Rails: None Entrance Stairs-Number of Steps: 2-3 Discharge Bathroom Shower/Tub: Tub/shower unit Discharge Bathroom Toilet: Standard Discharge Bathroom Accessibility: Yes How Accessible: Accessible via walker Does the patient have any problems obtaining your medications?: No  Social/Family/Support Systems Patient Roles: Spouse, Parent Anticipated Caregiver: spouse Lauree Chandler), daughter Sydell Axon), other family members Anticipated Ambulance person Information: Lauree Chandler ([place]) [phone], Feliciana Rossetti) [phone] Ability/Limitations of Caregiver: family agreeable to provide whatever assist level needed Caregiver Availability: 24/7 Discharge Plan Discussed with Primary Caregiver: Yes Is Caregiver In Agreement with Plan?: Yes Does Caregiver/Family have Issues with Lodging/Transportation while Pt is in Rehab?: No  Goals Patient/Family Goal for Rehab: PT/OT mod assist, SLP min to mod assist Expected length  of stay: 3-4 weeks Additional Information: spanish interpretter needed Pt/Family Agrees to Admission and willing to participate: Yes Program Orientation Provided & Reviewed with Pt/Caregiver Including Roles  & Responsibilities: Yes  Barriers to Discharge: Insurance for SNF coverage   Decrease burden of Care through IP rehab admission: Specialzed equipment needs, Diet advancement, Decrease number of caregivers, Bowel and bladder program and Patient/family education   Possible need for SNF placement upon discharge: Not anticipated.  Pt's family not interested in SNF placement.   Patient Condition: This patient's medical and functional status has changed since the consult dated: 04/02/2020 in which the Rehabilitation Physician determined and documented that the patient's condition is appropriate for intensive rehabilitative care in an inpatient rehabilitation facility. See "History of Present Illness" (above) for medical update. Functional changes are: overall max assist. Patient's medical and functional status update has been discussed with the Rehabilitation physician and patient remains appropriate for inpatient rehabilitation. Will admit to inpatient rehab today.  Preadmission Screen Completed By:  Michel Santee, PT, DPT 04/16/2020 10:53 AM ______________________________________________________________________   Discussed status with  Dr. Ranell Patrick on 04/16/20 at 10:53 AM and received approval for admission today.  Admission Coordinator: Shann Medal with brief updates by  Cleatrice Burke, time 10:53 AM Sudie Grumbling 04/16/20

## 2020-04-15 NOTE — Progress Notes (Signed)
Pt no longer has cortrak feeding tube. Contacted pharmacy to have all "per tube" meds switched to oral route. Pharmacy converted all of tomorrow's meds to oral, but reports they are unable to change 10pm med route since it was past 10pm when the request was put in.   All "per tube" 10pm meds were given under "future medication" in the [facility] so as to comply with the correct route of administration. Verified route and dose with second RN.

## 2020-04-16 ENCOUNTER — Other Ambulatory Visit: Payer: Self-pay

## 2020-04-16 ENCOUNTER — Encounter (HOSPITAL_COMMUNITY): Payer: Self-pay | Admitting: Physical Medicine & Rehabilitation

## 2020-04-16 ENCOUNTER — Inpatient Hospital Stay (HOSPITAL_COMMUNITY)
Admission: RE | Admit: 2020-04-16 | Discharge: 2020-05-13 | DRG: 057 | Disposition: A | Discharge status: Home / Self Care | Payer: BC Managed Care – PPO | Source: Intra-hospital | Attending: Physical Medicine & Rehabilitation | Admitting: Physical Medicine & Rehabilitation

## 2020-04-16 DIAGNOSIS — I69351 Hemiplegia and hemiparesis following cerebral infarction affecting right dominant side: Principal | ICD-10-CM

## 2020-04-16 DIAGNOSIS — I69392 Facial weakness following cerebral infarction: Secondary | ICD-10-CM | POA: Diagnosis not present

## 2020-04-16 DIAGNOSIS — E46 Unspecified protein-calorie malnutrition: Secondary | ICD-10-CM | POA: Diagnosis not present

## 2020-04-16 DIAGNOSIS — I63512 Cerebral infarction due to unspecified occlusion or stenosis of left middle cerebral artery: Secondary | ICD-10-CM | POA: Diagnosis not present

## 2020-04-16 DIAGNOSIS — I161 Hypertensive emergency: Secondary | ICD-10-CM | POA: Diagnosis present

## 2020-04-16 DIAGNOSIS — R109 Unspecified abdominal pain: Secondary | ICD-10-CM | POA: Diagnosis not present

## 2020-04-16 DIAGNOSIS — R1011 Right upper quadrant pain: Secondary | ICD-10-CM | POA: Diagnosis present

## 2020-04-16 DIAGNOSIS — M7541 Impingement syndrome of right shoulder: Secondary | ICD-10-CM | POA: Diagnosis present

## 2020-04-16 DIAGNOSIS — E785 Hyperlipidemia, unspecified: Secondary | ICD-10-CM | POA: Diagnosis not present

## 2020-04-16 DIAGNOSIS — Z6829 Body mass index (BMI) 29.0-29.9, adult: Secondary | ICD-10-CM

## 2020-04-16 DIAGNOSIS — Q211 Atrial septal defect: Secondary | ICD-10-CM

## 2020-04-16 DIAGNOSIS — I69391 Dysphagia following cerebral infarction: Secondary | ICD-10-CM | POA: Diagnosis not present

## 2020-04-16 DIAGNOSIS — K760 Fatty (change of) liver, not elsewhere classified: Secondary | ICD-10-CM | POA: Diagnosis not present

## 2020-04-16 DIAGNOSIS — E669 Obesity, unspecified: Secondary | ICD-10-CM | POA: Diagnosis present

## 2020-04-16 DIAGNOSIS — Z7982 Long term (current) use of aspirin: Secondary | ICD-10-CM

## 2020-04-16 DIAGNOSIS — I10 Essential (primary) hypertension: Secondary | ICD-10-CM | POA: Diagnosis not present

## 2020-04-16 DIAGNOSIS — M25519 Pain in unspecified shoulder: Secondary | ICD-10-CM

## 2020-04-16 DIAGNOSIS — M25512 Pain in left shoulder: Secondary | ICD-10-CM | POA: Diagnosis present

## 2020-04-16 DIAGNOSIS — I69398 Other sequelae of cerebral infarction: Secondary | ICD-10-CM

## 2020-04-16 DIAGNOSIS — J189 Pneumonia, unspecified organism: Secondary | ICD-10-CM | POA: Diagnosis not present

## 2020-04-16 DIAGNOSIS — R1310 Dysphagia, unspecified: Secondary | ICD-10-CM

## 2020-04-16 DIAGNOSIS — R079 Chest pain, unspecified: Secondary | ICD-10-CM | POA: Diagnosis present

## 2020-04-16 DIAGNOSIS — E1165 Type 2 diabetes mellitus with hyperglycemia: Secondary | ICD-10-CM | POA: Diagnosis present

## 2020-04-16 DIAGNOSIS — M19011 Primary osteoarthritis, right shoulder: Secondary | ICD-10-CM | POA: Diagnosis present

## 2020-04-16 DIAGNOSIS — E119 Type 2 diabetes mellitus without complications: Secondary | ICD-10-CM | POA: Diagnosis not present

## 2020-04-16 DIAGNOSIS — R339 Retention of urine, unspecified: Secondary | ICD-10-CM | POA: Diagnosis not present

## 2020-04-16 DIAGNOSIS — Z794 Long term (current) use of insulin: Secondary | ICD-10-CM

## 2020-04-16 DIAGNOSIS — M25511 Pain in right shoulder: Secondary | ICD-10-CM | POA: Diagnosis not present

## 2020-04-16 DIAGNOSIS — I69320 Aphasia following cerebral infarction: Secondary | ICD-10-CM

## 2020-04-16 DIAGNOSIS — T380X5A Adverse effect of glucocorticoids and synthetic analogues, initial encounter: Secondary | ICD-10-CM | POA: Diagnosis present

## 2020-04-16 DIAGNOSIS — Z79899 Other long term (current) drug therapy: Secondary | ICD-10-CM | POA: Diagnosis not present

## 2020-04-16 DIAGNOSIS — I609 Nontraumatic subarachnoid hemorrhage, unspecified: Secondary | ICD-10-CM

## 2020-04-16 DIAGNOSIS — IMO0002 Reserved for concepts with insufficient information to code with codable children: Secondary | ICD-10-CM | POA: Diagnosis present

## 2020-04-16 DIAGNOSIS — G936 Cerebral edema: Secondary | ICD-10-CM | POA: Diagnosis present

## 2020-04-16 LAB — GLUCOSE, CAPILLARY
Glucose-Capillary: 187 mg/dL — ABNORMAL HIGH (ref 70–99)
Glucose-Capillary: 286 mg/dL — ABNORMAL HIGH (ref 70–99)
Glucose-Capillary: 269 mg/dL — ABNORMAL HIGH (ref 70–99)
Glucose-Capillary: 188 mg/dL — ABNORMAL HIGH (ref 70–99)

## 2020-04-16 LAB — CBC WITH DIFFERENTIAL/PLATELET
Abs Immature Granulocytes: 0.04 10*3/uL (ref 0.00–0.07)
Basophils Absolute: 0.1 10*3/uL (ref 0.0–0.1)
Basophils Relative: 1 %
Eosinophils Absolute: 0.3 10*3/uL (ref 0.0–0.5)
Eosinophils Relative: 4 %
HCT: 36.9 % — ABNORMAL LOW (ref 39.0–52.0)
Hemoglobin: 12.6 g/dL — ABNORMAL LOW (ref 13.0–17.0)
Immature Granulocytes: 1 %
Lymphocytes Relative: 22 %
Lymphs Abs: 1.7 10*3/uL (ref 0.7–4.0)
MCH: 31.4 pg (ref 26.0–34.0)
MCHC: 34.1 g/dL (ref 30.0–36.0)
MCV: 92 fL (ref 80.0–100.0)
Monocytes Absolute: 0.5 10*3/uL (ref 0.1–1.0)
Monocytes Relative: 7 %
Neutro Abs: 5 10*3/uL (ref 1.7–7.7)
Neutrophils Relative %: 65 %
Platelets: 198 10*3/uL (ref 150–400)
RBC: 4.01 MIL/uL — ABNORMAL LOW (ref 4.22–5.81)
RDW: 13.3 % (ref 11.5–15.5)
WBC: 7.5 10*3/uL (ref 4.0–10.5)
nRBC: 0 % (ref 0.0–0.2)

## 2020-04-16 LAB — COMPREHENSIVE METABOLIC PANEL
ALT: 51 U/L — ABNORMAL HIGH (ref 0–44)
AST: 32 U/L (ref 15–41)
Albumin: 2.5 g/dL — ABNORMAL LOW (ref 3.5–5.0)
Alkaline Phosphatase: 103 U/L (ref 38–126)
Anion gap: 9 (ref 5–15)
BUN: 18 mg/dL (ref 6–20)
CO2: 27 mmol/L (ref 22–32)
Calcium: 9.4 mg/dL (ref 8.9–10.3)
Chloride: 101 mmol/L (ref 98–111)
Creatinine, Ser: 0.93 mg/dL (ref 0.61–1.24)
GFR calc Af Amer: >60 mL/min (ref >60)
GFR calc non Af Amer: >60 mL/min (ref >60)
Glucose, Bld: 206 mg/dL — ABNORMAL HIGH (ref 70–99)
Potassium: 4.1 mmol/L (ref 3.5–5.1)
Sodium: 137 mmol/L (ref 135–145)
Total Bilirubin: 0.8 mg/dL (ref 0.3–1.2)
Total Protein: 6.2 g/dL — ABNORMAL LOW (ref 6.5–8.1)

## 2020-04-16 MED ORDER — ATORVASTATIN CALCIUM 80 MG PO TABS
80.0000 mg | ORAL_TABLET | Freq: Every day | ORAL | Status: DC
  Administered 2020-04-17 – 2020-05-13 (×27): 80 mg via ORAL
  Filled 2020-04-16 (×27): qty 1

## 2020-04-16 MED ORDER — INSULIN ASPART 100 UNIT/ML ~~LOC~~ SOLN: 0.0000 [IU] | Freq: Three times a day (TID) | SUBCUTANEOUS | 11 refills | Status: DC

## 2020-04-16 MED ORDER — INSULIN GLARGINE 100 UNIT/ML ~~LOC~~ SOLN
35.0000 [IU] | Freq: Two times a day (BID) | SUBCUTANEOUS | Status: DC
  Administered 2020-04-16 – 2020-04-18 (×4): 35 [IU] via SUBCUTANEOUS
  Filled 2020-04-16 (×6): qty 0.35

## 2020-04-16 MED ORDER — HEPARIN SODIUM (PORCINE) 5000 UNIT/ML IJ SOLN
5000.0000 [IU] | Freq: Three times a day (TID) | INTRAMUSCULAR | Status: DC
  Administered 2020-04-16 – 2020-04-17 (×2): 5000 [IU] via SUBCUTANEOUS
  Filled 2020-04-16 (×2): qty 1

## 2020-04-16 MED ORDER — PANTOPRAZOLE SODIUM 40 MG PO PACK
40.0000 mg | PACK | Freq: Every day | ORAL | Status: DC
  Administered 2020-04-16 – 2020-05-12 (×26): 40 mg via ORAL
  Filled 2020-04-16 (×18): qty 20

## 2020-04-16 MED ORDER — AMANTADINE HCL 50 MG/5ML PO SYRP
100.0000 mg | ORAL_SOLUTION | Freq: Two times a day (BID) | ORAL | Status: DC
  Administered 2020-04-16 – 2020-05-13 (×54): 100 mg via ORAL
  Filled 2020-04-16 (×60): qty 10

## 2020-04-16 MED ORDER — ASPIRIN 81 MG PO CHEW
81.0000 mg | CHEWABLE_TABLET | Freq: Every day | ORAL | Status: DC
  Administered 2020-04-17 – 2020-05-13 (×27): 81 mg via ORAL
  Filled 2020-04-16 (×27): qty 1

## 2020-04-16 MED ORDER — ACETAMINOPHEN 160 MG/5ML PO SOLN
650.0000 mg | Freq: Four times a day (QID) | ORAL | Status: DC | PRN
  Administered 2020-04-29: 650 mg via ORAL
  Filled 2020-04-16: qty 20.3

## 2020-04-16 MED ORDER — RESOURCE THICKENUP CLEAR PO POWD: 1.0000 | ORAL | Status: DC | PRN

## 2020-04-16 MED ORDER — GERHARDT'S BUTT CREAM: 1.0000 "application " | TOPICAL_CREAM | CUTANEOUS | Status: DC | PRN

## 2020-04-16 MED ORDER — CHLORHEXIDINE GLUCONATE CLOTH 2 % EX PADS: 6.0000 | MEDICATED_PAD | Freq: Every day | CUTANEOUS | Status: DC

## 2020-04-16 MED ORDER — LOSARTAN POTASSIUM 50 MG PO TABS: 50.0000 mg | ORAL_TABLET | Freq: Every day | ORAL | Status: DC

## 2020-04-16 MED ORDER — LOSARTAN POTASSIUM 50 MG PO TABS
50.0000 mg | ORAL_TABLET | Freq: Every day | ORAL | Status: DC
  Filled 2020-04-16: qty 1

## 2020-04-16 MED ORDER — PANTOPRAZOLE SODIUM 40 MG PO PACK: 40.0000 mg | PACK | Freq: Every day | ORAL | Status: DC

## 2020-04-16 MED ORDER — CHLORHEXIDINE GLUCONATE 0.12 % MT SOLN: 15.0000 mL | Freq: Two times a day (BID) | OROMUCOSAL | 0 refills | Status: DC

## 2020-04-16 MED ORDER — LIVING WELL WITH DIABETES BOOK - IN SPANISH
Freq: Once | Status: AC
  Filled 2020-04-16: qty 1

## 2020-04-16 MED ORDER — ATORVASTATIN CALCIUM 80 MG PO TABS: 80.0000 mg | ORAL_TABLET | Freq: Every day | ORAL | Status: DC

## 2020-04-16 MED ORDER — ACETAMINOPHEN 325 MG PO TABS
650.0000 mg | ORAL_TABLET | Freq: Four times a day (QID) | ORAL | Status: DC | PRN
  Administered 2020-04-16 – 2020-05-08 (×3): 650 mg via ORAL
  Filled 2020-04-16 (×4): qty 2

## 2020-04-16 MED ORDER — SODIUM CHLORIDE 0.9 % IV SOLN: 1.0000 g | INTRAVENOUS | Status: DC

## 2020-04-16 MED ORDER — ORAL CARE MOUTH RINSE: 15.0000 mL | Freq: Two times a day (BID) | OROMUCOSAL | 0 refills | Status: DC

## 2020-04-16 MED ORDER — TICAGRELOR 90 MG PO TABS: 90.0000 mg | ORAL_TABLET | Freq: Two times a day (BID) | ORAL | Status: DC

## 2020-04-16 MED ORDER — ENSURE ENLIVE PO LIQD
237.0000 mL | Freq: Two times a day (BID) | ORAL | Status: DC
  Administered 2020-04-16 (×2): 237 mL via ORAL

## 2020-04-16 MED ORDER — SORBITOL 70 % SOLN
30.0000 mL | Freq: Every day | Status: DC | PRN
  Administered 2020-05-07: 30 mL via ORAL
  Filled 2020-04-16 (×2): qty 30

## 2020-04-16 MED ORDER — GERHARDT'S BUTT CREAM
1.0000 "application " | TOPICAL_CREAM | CUTANEOUS | Status: DC | PRN
  Filled 2020-04-16: qty 1

## 2020-04-16 MED ORDER — HEPARIN SODIUM (PORCINE) 5000 UNIT/ML IJ SOLN: 5000.0000 [IU] | Freq: Three times a day (TID) | INTRAMUSCULAR | Status: DC

## 2020-04-16 MED ORDER — AMANTADINE HCL 50 MG/5ML PO SYRP: 100.0000 mg | ORAL_SOLUTION | Freq: Two times a day (BID) | ORAL | 0 refills | Status: DC

## 2020-04-16 MED ORDER — RESOURCE THICKENUP CLEAR PO POWD
ORAL | Status: DC | PRN
  Filled 2020-04-16: qty 125

## 2020-04-16 MED ORDER — ENSURE ENLIVE PO LIQD
237.0000 mL | Freq: Two times a day (BID) | ORAL | Status: DC
  Administered 2020-04-17 – 2020-05-13 (×42): 237 mL via ORAL

## 2020-04-16 MED ORDER — ENSURE ENLIVE PO LIQD: 237.0000 mL | Freq: Two times a day (BID) | ORAL | 12 refills | Status: DC

## 2020-04-16 MED ORDER — TICAGRELOR 90 MG PO TABS
90.0000 mg | ORAL_TABLET | Freq: Two times a day (BID) | ORAL | Status: DC
  Administered 2020-04-16 – 2020-05-13 (×54): 90 mg via ORAL
  Filled 2020-04-16 (×54): qty 1

## 2020-04-16 MED ORDER — INSULIN ASPART 100 UNIT/ML ~~LOC~~ SOLN
0.0000 [IU] | Freq: Three times a day (TID) | SUBCUTANEOUS | Status: DC
  Administered 2020-04-16: 8 [IU] via SUBCUTANEOUS
  Administered 2020-04-17: 5 [IU] via SUBCUTANEOUS
  Administered 2020-04-17: 3 [IU] via SUBCUTANEOUS
  Administered 2020-04-17: 2 [IU] via SUBCUTANEOUS
  Administered 2020-04-18: 3 [IU] via SUBCUTANEOUS
  Administered 2020-04-18: 5 [IU] via SUBCUTANEOUS
  Administered 2020-04-18 – 2020-04-19 (×2): 3 [IU] via SUBCUTANEOUS
  Administered 2020-04-19: 5 [IU] via SUBCUTANEOUS
  Administered 2020-04-20: 3 [IU] via SUBCUTANEOUS
  Administered 2020-04-20: 5 [IU] via SUBCUTANEOUS
  Administered 2020-04-21: 3 [IU] via SUBCUTANEOUS
  Administered 2020-04-22 (×2): 2 [IU] via SUBCUTANEOUS
  Administered 2020-04-23 (×2): 3 [IU] via SUBCUTANEOUS
  Administered 2020-04-24: 5 [IU] via SUBCUTANEOUS
  Administered 2020-04-24 – 2020-04-25 (×2): 3 [IU] via SUBCUTANEOUS
  Administered 2020-04-26: 2 [IU] via SUBCUTANEOUS
  Administered 2020-04-26 – 2020-04-27 (×3): 3 [IU] via SUBCUTANEOUS
  Administered 2020-04-28: 1 [IU] via SUBCUTANEOUS
  Administered 2020-04-29: 3 [IU] via SUBCUTANEOUS
  Administered 2020-04-29: 5 [IU] via SUBCUTANEOUS
  Administered 2020-04-30: 3 [IU] via SUBCUTANEOUS
  Administered 2020-04-30 (×2): 5 [IU] via SUBCUTANEOUS
  Administered 2020-05-01: 3 [IU] via SUBCUTANEOUS
  Administered 2020-05-01: 5 [IU] via SUBCUTANEOUS
  Administered 2020-05-02: 2 [IU] via SUBCUTANEOUS
  Administered 2020-05-02: 5 [IU] via SUBCUTANEOUS
  Administered 2020-05-03: 3 [IU] via SUBCUTANEOUS
  Administered 2020-05-03: 2 [IU] via SUBCUTANEOUS
  Administered 2020-05-04: 5 [IU] via SUBCUTANEOUS
  Administered 2020-05-04 – 2020-05-06 (×4): 2 [IU] via SUBCUTANEOUS
  Administered 2020-05-06: 5 [IU] via SUBCUTANEOUS
  Administered 2020-05-07 – 2020-05-08 (×4): 3 [IU] via SUBCUTANEOUS
  Administered 2020-05-09: 2 [IU] via SUBCUTANEOUS
  Administered 2020-05-10: 3 [IU] via SUBCUTANEOUS
  Administered 2020-05-10: 5 [IU] via SUBCUTANEOUS
  Administered 2020-05-11 (×2): 2 [IU] via SUBCUTANEOUS
  Administered 2020-05-12 (×2): 3 [IU] via SUBCUTANEOUS

## 2020-04-16 MED ORDER — ASPIRIN 81 MG PO CHEW: 81.0000 mg | CHEWABLE_TABLET | Freq: Every day | ORAL | Status: AC

## 2020-04-16 MED ORDER — INSULIN GLARGINE 100 UNIT/ML ~~LOC~~ SOLN: 35.0000 [IU] | Freq: Two times a day (BID) | SUBCUTANEOUS | 11 refills | Status: DC

## 2020-04-16 NOTE — Progress Notes (Signed)
Cristina Gong, RN  Rehab Admission Coordinator  Physical Medicine and Rehabilitation  PMR Pre-admission     Signed  Date of Service:  04/15/2020 10:12 AM      Related encounter: ED to Hosp-Admission (Current) from 03/24/2020 in [facility]      Signed        Show:Clear all [x] Manual[x] Template[x] Copied  Added by: [x] Cristina Gong, RN[x] Michel Santee, PT  [] Hover for details PMR Admission Coordinator Pre-Admission Assessment   Patient: Brad Delacruz is an 59 y.o., male MRN: [id number] DOB: [birth year] Height: 5' 5"  (165.1 cm) Weight: 75.6 kg                                                                                                                                                  Insurance Information HMO:     PPO: yes     PCP:      IPA:      80/20:      OTHER:  PRIMARY: [employer] [place]      Policy#: [id number]      Subscriber: pt CM Name: Amy      Phone#: [phone]     Fax#: [phone] Pre-Cert#: [id number] auth for CIR provided by Amy with [employer] of [place], updates due 7 days from admission on 04/16/2020 to fax listed above.       Employer: n/a Benefits:  Phone #: [phone]     Name:  Eff. Date: 09/04/18     Deduct: $500 (met $500)      Out of Pocket Max: $5000 (met $1680.30)      Life Max: n/a  CIR: 70%      SNF: 70% Outpatient: 70%     Co-Ins: 30% Home Health: 70%      Co-Ins: 30% DME: 70%     Co-Ins: 30% Providers: preferred network   SECONDARY:       Policy#:       Phone#:    Development worker, community:       Phone#:    The Engineer, petroleum" for patients in Inpatient Rehabilitation Facilities with attached "Privacy Act [place] Records" was provided and verbally reviewed with: N/A   Emergency Contact Information         Contact Information     Name Relation Home Work Mobile    [facility] Spouse [place], Dora Daughter     [phone]       Current Medical History    Patient Admitting Diagnosis: L MCA CVA   History of Present Illness: Brad Delacruz is a 59 year old right-handed male history of diabetes mellitus and hypertension. Presented 03/24/2020 with right-sided weakness and aphasia to [facility].  Admission chemistries with alcohol negative, sodium 130, potassium 3.3, glucose 309, hemoglobin A1c 9.2, urine drug screen positive benzos.  Cranial CT scan negative for acute changes.  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  CT cerebral perfusion scan as well as CT angiogram of head and neck showed a large left MCA territory nonhemorrhagic infarction as well as acute left M1 large vessel occlusion.  Large penumbra with mismatch volume of 99 mL.  Patient was transferred to [facility].  Patient received TPA, followed by mechanical thrombectomy per interventional radiology.  Echocardiogram with ejection fraction of 07% grade 1 diastolic dysfunction.  TCD bubble study was positive for small PFO and lower extremity Dopplers negative for DVT.  Patient remained intubated through 04/01/2020.  Latest follow-up cranial CT scan 04/08/2020 shows evolving large left MCA territory infarct with similar mass-effect, but no new hemorrhage or hydrocephalus.  Maintained on low-dose aspirin as well as Brilinta for CVA prophylaxis.  Considerations to be made for TEE and loop recorder as outpatient.  Subcutaneous heparin for DVT prophylaxis.  Cortrak placed for nutritional support, now receiving supplemental nutrition nocturnally and tolerating dysphagia #1 diet with honey thick liquid.  He did complete a course of Ancef 03/31/2020 to 04/05/2020 for pneumonia.  Therapy evaluations completed and patient was recommended for a comprehensive rehab program.   Complete NIHSS TOTAL: 19 Glasgow Coma Scale Score: 11   Past Medical History      Past Medical History:  Diagnosis Date  . Diabetes mellitus without complication (HCC)        Family History   family history is not on file.   Prior Rehab/Hospitalizations:  Has the patient had prior rehab or hospitalizations prior to admission? No   Has the patient had major surgery during 100 days prior to admission? No   Current Medications    Current Facility-Administered Medications:  .  acetaminophen (TYLENOL) tablet 650 mg, 650 mg, Oral, Q6H PRN, 650 mg at 04/16/20 0811 **OR** acetaminophen (TYLENOL) 160 MG/5ML solution 650 mg, 650 mg, Oral, Q6H PRN, Garvin Fila, MD .  amantadine (SYMMETREL) 50 MG/5ML solution 100 mg, 100 mg, Oral, BID, Garvin Fila, MD, 100 mg at 04/16/20 0916 .  aspirin chewable tablet 81 mg, 81 mg, Oral, Daily, Garvin Fila, MD, 81 mg at 04/16/20 0912 .  atorvastatin (LIPITOR) tablet 80 mg, 80 mg, Oral, Daily, Garvin Fila, MD, 80 mg at 04/16/20 0912 .  cefTRIAXone (ROCEPHIN) 1 g in sodium chloride 0.9 % 100 mL IVPB, 1 g, Intravenous, Q24H, Garvin Fila, MD, Last Rate: 200 mL/hr at 04/15/20 1744, 1 g at 04/15/20 1744 .  chlorhexidine (PERIDEX) 0.12 % solution 15 mL, 15 mL, Mouth/Throat, BID, Garvin Fila, MD, 15 mL at 04/16/20 0911 .  Chlorhexidine Gluconate Cloth 2 % PADS 6 each, 6 each, Topical, Daily, Kipp Brood, MD, 6 each at 04/16/20 0914 .  feeding supplement (ENSURE ENLIVE) (ENSURE ENLIVE) liquid 237 mL, 237 mL, Oral, BID BM, Garvin Fila, MD, 237 mL at 04/16/20 0919 .  Gerhardt's butt cream, , Topical, PRN, Garvin Fila, MD, Given at 04/03/20 1710 .  haloperidol lactate (HALDOL) injection 2 mg, 2 mg, Intravenous, Q8H PRN, Garvin Fila, MD .  heparin injection 5,000 Units, 5,000 Units, Subcutaneous, Q8H, Garvin Fila, MD, 5,000 Units at 04/16/20 8675 .  hydrALAZINE (APRESOLINE) injection 5-10 mg, 5-10 mg, Intravenous, Q4H PRN, Kipp Brood, MD, 10 mg at 04/07/20 1011 .  insulin aspart (novoLOG) injection 0-15 Units, 0-15 Units, Subcutaneous, TID WC, Aroor, Lanice Schwab, MD, 3 Units at 04/16/20 0641 .  insulin glargine (  LANTUS)  injection 35 Units, 35 Units, Subcutaneous, BID, Donzetta Starch, NP, 35 Units at 04/16/20 0916 .  losartan (COZAAR) tablet 50 mg, 50 mg, Oral, Daily, Garvin Fila, MD, 50 mg at 04/16/20 0912 .  MEDLINE mouth rinse, 15 mL, Mouth Rinse, q12n4p, Agarwala, Ravi, MD, 15 mL at 04/15/20 1735 .  pantoprazole sodium (PROTONIX) 40 mg/20 mL oral suspension 40 mg, 40 mg, Oral, QHS, Garvin Fila, MD, 40 mg at 04/15/20 2248 .  Resource [employer], , Oral, PRN, Rosalin Hawking, MD .  ticagrelor [facility]) tablet 90 mg, 90 mg, Oral, BID, Garvin Fila, MD, 90 mg at 04/16/20 2694   Patients Current Diet:     Diet Order                      DIET - DYS 1 Room service appropriate? 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  Independent   Indoor Mobility: Did the patient need assistance with walking from room to room (with or without device)? Independent   Stairs: Did the patient need assistance with internal or external stairs (with or without device)? Independent   Functional Cognition: Did the patient need help planning regular tasks such as shopping or remembering to take medications? Independent   Home Assistive Devices / Equipment Home Assistive Devices/Equipment: None Home Equipment: Crutches   Prior Device Use: Indicate devices/aids used by the patient prior to current illness, exacerbation or  injury? None of the above   Current Functional Level Cognition   Arousal/Alertness: Lethargic Overall Cognitive Status: Difficult to assess Difficult to assess due to: Impaired communication, Non-English speaking Current Attention Level: Sustained Orientation Level: Other (comment)(UTA) Following Commands: Follows one step commands inconsistently, Follows one step commands with increased time Safety/Judgement: Decreased awareness of safety, Decreased awareness of deficits General Comments: daughter interpreted during session; pt did not attempt to verbalize Attention: Sustained Sustained Attention: Impaired Sustained Attention Impairment: Functional basic    Extremity Assessment (includes Sensation/Coordination)   Upper Extremity Assessment: RUE deficits/detail RUE Deficits / Details: No active movement or attempts to initiate movement with Rt UE noted this date RUE Coordination: decreased fine motor, decreased gross motor  Lower Extremity Assessment: Defer to PT evaluation RLE Deficits / Details: dense weakness with no initiation of any kind LLE Deficits / Details: extends his hip on command only     ADLs   Overall ADL's : Needs assistance/impaired Eating/Feeding: Moderate assistance, Sitting, Cueing for sequencing, Cueing for safety Eating/Feeding Details (indicate cue type and reason): pt sitting EOB, initially denying food by shaking head no. Pt has difficulty with apraxia and self feeding, needing multimodal cues to initiate hand to mouth pattern Grooming: Wash/dry face, Wash/dry hands, Total assistance, Sitting Grooming Details (indicate cue type and reason): pt given cues then cloth brought to face adn pt wiped out his eyes Lower Body Dressing: Total  assistance Toileting- Clothing Manipulation and Hygiene: Total assistance Toileting - Clothing Manipulation Details (indicate cue type and reason): Pt standing and indicating through gestures, increased restlessness that he needed  something prior to his catheter busting and urine pouring on floor Functional mobility during ADLs: Maximal assistance, +2 for physical assistance, Total assistance General ADL Comments: session focused on attaining safe upright posture EOB to decrease pushing     Mobility   Overal bed mobility: Needs Assistance Bed Mobility: Rolling, Sidelying to Sit, Sit to Sidelying Rolling: Mod assist Sidelying to sit: Max assist Supine to sit: +2 for physical assistance, Max assist Sit to supine: Max assist, +2 for physical assistance Sit to sidelying: Total assist, +2 for physical assistance General bed mobility comments: multimodal cues for sequencing; use of rail with L UE; assist to bring bilat LE/hips to EOB with bed pad and elevate trunk into sitting     Transfers   Overall transfer level: Needs assistance Equipment used: 2 person hand held assist Transfer via Lift Equipment: Stedy Transfers: Sit to/from Stand Sit to Stand: Max assist, +2 physical assistance  Lateral/Scoot Transfers: Max assist, +2 physical assistance General transfer comment: stood X 2 from EOB; assist to power up into standing, support R UE, and maintain upright midline posture; pt requires tactiles cues/assist to flex L knee and elbow as pt pushing to R side on standing frame     Ambulation / Gait / Stairs / Wheelchair Mobility   Ambulation/Gait General Gait Details: unable     Posture / Balance Dynamic Sitting Balance Sitting balance - Comments: pt requires varying levels of assist to maintain sitting balance EOB as pt pushes with L UE; requires tactile cues/assist for L elbow flexion; worked on lateral trunk flexion and rotation; attempted use of mirror for midline position however pt unable to focus on mirror  Balance Overall balance assessment: Needs assistance Sitting-balance support: Feet supported, Single extremity supported Sitting balance-Leahy Scale: Zero Sitting balance - Comments: pt requires varying levels  of assist to maintain sitting balance EOB as pt pushes with L UE; requires tactile cues/assist for L elbow flexion; worked on lateral trunk flexion and rotation; attempted use of mirror for midline position however pt unable to focus on mirror  Postural control: Right lateral lean(pushing with LUE) Standing balance support: Bilateral upper extremity supported Standing balance-Leahy Scale: Zero Standing balance comment: did not achieve full standing High Level Balance Comments: sitting EOB attempting to engage LUE in activities/following commands to reduce his pushing towards his rt side; followed no commands with LUE (followed other commands, but not LUE); total time EOB ~10-12 minutes     Special needs/care consideration Continuous Drip IV  rocephin 200 mL/hr, Diabetic management yes, Special service needs cortrak for supplemental nutrition and Designated visitor wife Lauree Chandler, daughter Sydell Axon    Previous Environmental health practitioner (from acute therapy documentation) Living Arrangements: Spouse/significant other, Children  Lives With: Family Available Help at Discharge: Family, Available 24 hours/day Type of Home: House Home Layout: One level Home Access: Stairs to enter Entrance Stairs-Rails: None Entrance Stairs-Number of Steps: 2-3 back steps Bathroom Shower/Tub: Chiropodist: Standard Home Care Services: No Additional Comments: information of home from chart as family is gone and pt not very alert   Discharge Living Setting Plans for Discharge Living Setting: Patient's home Type of Home at Discharge: House Discharge Home Layout: One level Discharge Home Access: Stairs to enter Entrance Stairs-Rails: None Entrance Stairs-Number of Steps: 2-3 Discharge Bathroom Shower/Tub: Tub/shower unit Discharge Bathroom Toilet:  Standard Discharge Bathroom Accessibility: Yes How Accessible: Accessible via walker Does the patient have any problems obtaining your medications?: No     Social/Family/Support Systems Patient Roles: Spouse, Parent Anticipated Caregiver: spouse Lauree Chandler), daughter Sydell Axon), other family members Anticipated Ambulance person Information: Lauree Chandler ([place]) [phone], Feliciana Rossetti) [phone] Ability/Limitations of Caregiver: family agreeable to provide whatever assist level needed Caregiver Availability: 24/7 Discharge Plan Discussed with Primary Caregiver: Yes Is Caregiver In Agreement with Plan?: Yes Does Caregiver/Family have Issues with Lodging/Transportation while Pt is in Rehab?: No   Goals Patient/Family Goal for Rehab: PT/OT mod assist, SLP min to mod assist Expected length of stay: 3-4 weeks Additional Information: spanish interpretter needed Pt/Family Agrees to Admission and willing to participate: Yes Program Orientation Provided & Reviewed with Pt/Caregiver Including Roles  & Responsibilities: Yes  Barriers to Discharge: Insurance for SNF coverage     Decrease burden of Care through IP rehab admission: Specialzed equipment needs, Diet advancement, Decrease number of caregivers, Bowel and bladder program and Patient/family education     Possible need for SNF placement upon discharge: Not anticipated.  Pt's family not interested in SNF placement.    Patient Condition: This patient's medical and functional status has changed since the consult dated: 04/02/2020 in which the Rehabilitation Physician determined and documented that the patient's condition is appropriate for intensive rehabilitative care in an inpatient rehabilitation facility. See "History of Present Illness" (above) for medical update. Functional changes are: overall max assist. Patient's medical and functional status update has been discussed with the Rehabilitation physician and patient remains appropriate for inpatient rehabilitation. Will admit to inpatient rehab today.   Preadmission Screen Completed By:  Michel Santee, PT, DPT 04/16/2020 10:53  AM ______________________________________________________________________   Discussed status with Dr. Ranell Patrick on 04/16/20 at 10:53 AM and received approval for admission today.   Admission Coordinator: Shann Medal with brief updates by  Cleatrice Burke, time 10:53 AM Sudie Grumbling 04/16/20              Cosigned by: Izora Ribas, MD at 04/16/2020 10:58 AM  Revision History

## 2020-04-16 NOTE — Progress Notes (Signed)
Brad Rouge, MD  Physician  Physical Medicine and Rehabilitation  Consult Note     Signed  Date of Service:  04/02/2020  5:20 AM      Related encounter: ED to Hosp-Admission (Current) from 03/24/2020 in [place] [facility]      Signed      Expand AllCollapse All   Show:Clear all [x] Manual[x] Template[] Copied  Added by: [x] [name], , PA-C[x] [name], , MD  [] Hover for details          Physical Medicine and Rehabilitation Consult Reason for Consult: Right side weakness Referring Physician: [name]     HPI: Brad Delacruz is a 59 y.o. right-handed male with history of diabetes mellitus and hypertension.  Per chart review patient lives with spouse independent prior to admission.  Presented 03/24/2020 with right-sided weakness and aphasia to [facility].  Cranial CT scan negative for acute changes.  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  CT cerebral perfusion scan as well as CT angiogram head and neck showed a large left MCA territory nonhemorrhagic infarction as well as acute left M1 large vessel occlusion.  Large penumbra with mismatch volume of 99 mL.  Patient was transferred to [facility].  Patient underwent TPA followed by mechanical thrombectomy revascularization per interventional radiology.  Echocardiogram with ejection fraction of 60% grade 1 diastolic dysfunction.  TCD bubble study was positive for small PFO and lower extremity Dopplers negative for DVT.  Patient remained intubated through 04/01/2020.  Latest follow-up cranial CT scan 03/29/2020 sequelae of large left MCA territory infarction remaining stable rightward midline shift 5 mm .  Currently maintained on low-dose aspirin for CVA prophylaxis.  Patient n.p.o. with alternative means of nutritional support and consideration is being made for possible PEG tube..  Subcutaneous heparin for DVT prophylaxis.  Currently awaiting plan for possible TEE and need for  loop recorder.  Patient spiked a low-grade fever 102.5 leukocytosis 10,500 respiratory culture [name] currently maintained on IV Rocephin urine culture 30,000 yeast.  Therapy evaluations completed with recommendations of physical medicine rehab consult.   Daughter reports he was able to move L leg this AM with therapy.  Also notices he grimaces to tactile stimulation of R arm.      Review of Systems  Unable to perform ROS: Acuity of condition        Past Medical History:  Diagnosis Date  . Diabetes mellitus without complication [facility])           Past Surgical History:  Procedure Laterality Date  . IR CT HEAD LTD   03/24/2020  . IR CT HEAD LTD   03/24/2020  . IR INTRA CRAN STENT   03/24/2020  . IR PATIENT EVAL TECH 0-60 MINS   03/25/2020  . IR PERCUTANEOUS ART THROMBECTOMY/INFUSION INTRACRANIAL INC DIAG ANGIO   03/24/2020  . RADIOLOGY WITH ANESTHESIA N/A 03/24/2020    Procedure: IR WITH ANESTHESIA;  Surgeon: 03/27/2020, MD;  Location: [facility] OR;  Service: Radiology;  Laterality: N/A;    History reviewed. No pertinent family history. Social History:  reports that he has never smoked. He has never used smokeless tobacco. He reports previous alcohol use. He reports that he does not use drugs. Allergies: No Known Allergies Medications Prior to Admission  Medication Sig Dispense Refill  . ciclopirox (PENLAC) 8 % solution Apply topically as directed.      . dapagliflozin propanediol (FARXIGA) 10 MG TABS tablet Take 10 mg by mouth daily.      03/26/2020  glipiZIDE (GLUCOTROL XL) 5 MG 24 hr tablet Take 5 mg by mouth 2 (two) times daily.      Marland Kitchen losartan (COZAAR) 25 MG tablet Take 25 mg by mouth daily.      . meloxicam (MOBIC) 15 MG tablet Take 15 mg by mouth daily.      . pioglitazone (ACTOS) 30 MG tablet Take 30 mg by mouth daily.      . rosuvastatin (CRESTOR) 5 MG tablet Take 5 mg by mouth daily.      Marland Kitchen XIGDUO XR 09-999 MG TB24 Take 1 tablet by mouth daily.          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 Information collected from daughter Functional Status:  Mobility: Bed Mobility Overal bed mobility: Needs Assistance Bed Mobility: Supine to Sit, Sit to Supine Rolling: Total assist Supine to sit: Total assist Sit to supine: Total assist General bed mobility comments: Total A to manage BLEs and elevate trunk Transfers Overall transfer level: Needs assistance Equipment used: 1 person hand held assist Transfers: Sit to/from Stand Sit to Stand: Total assist, From elevated surface General transfer comment: Defer for safety   ADL: ADL Overall ADL's : Needs assistance/impaired Grooming: Wash/dry hands, Total assistance, Sitting Grooming Details (indicate cue type and reason): Mod-Max A for sitting balance. Total hand over hand to bring hands together and rub.  General ADL Comments: Focused session on arousal, sitting balance, cognition, and visual tracking. Pt contineus to requiring Total A for ADLs and bed mobility.    Cognition: Cognition Overall Cognitive Status: Impaired/Different from baseline Arousal/Alertness: Lethargic Orientation Level: Oriented to person, Disoriented to place, Disoriented to time, Disoriented to situation Attention: Sustained Sustained Attention: Impaired Sustained Attention Impairment: Functional basic Cognition Arousal/Alertness:  Lethargic Behavior During Therapy: Flat affect Overall Cognitive Status: Impaired/Different from baseline Area of Impairment: Attention, Memory, Following commands, Safety/judgement, Awareness, Problem solving Current Attention Level: Focused Memory: Decreased recall of precautions, Decreased short-term memory Following Commands: Follows one step commands inconsistently(requires max cues) Safety/Judgement: Decreased awareness of safety, Decreased awareness of deficits Awareness: Intellectual Problem Solving: Slow processing, Requires verbal cues, Requires tactile cues, Decreased initiation, Difficulty sequencing General Comments: pt requries max verbal and tactile cues to follow commands Difficult to assess due to: Impaired communication   Blood pressure (!) 151/59, pulse 64, temperature 98.8 F (37.1 C), temperature source Axillary, resp. rate (!) 23, height 5\' 5"  (1.651 m), weight 83.1 kg, SpO2 100 %. Physical Exam  Nursing note and vitals reviewed. Constitutional: He appears well-developed and well-nourished.  Pt asleep - unable to awaken- daughter and wife at bedside, NAD  HENT:  Head: Normocephalic and atraumatic.  Nose: Nose normal.  Mouth/Throat: Oropharynx is clear and moist. No oropharyngeal exudate.  Has [place] O2 by nose- NGT in place Grimaces to noxious stimuli of RUE- R facial droop noted  Eyes: Right eye exhibits no discharge. Left eye exhibits no discharge.  Wouldn't open eyes, no matter the stimuli   Neck: No tracheal deviation present.  Cardiovascular:  RRR- borderline bradycardia- regular rhythm  Respiratory: No stridor.  Coarse breath sounds B/L- however no W/R/R Good air movement- O2 by [place]- RR- 25 or so and sats 97%  GI:  Has rectal tube in place Soft, NT, slightly distended?; hyperactive BS- very hyperactive  Genitourinary:    Genitourinary Comments: Condom catheter on penis No swelling of scrotum seen   Musculoskeletal:     Cervical back: Normal range of  motion and neck supple.     Comments: Very sedated Was able to get pt to wiggle toes on R with tickling stimuli Same wiggling on L foot with stimuli L hand in mitten and wrist restraints; none on R side No movement of RUE, in spite of noxious stimuli- would grimace and move LUE, but not RUE.   Neurological:  Lethargic but arousable.  Left gaze deviation.  Patient remained nonverbal throughout exam and did not follow commands.  Nonverbal, sedated (not on sedating meds) Has some sensation to light touch in RUE_ because tried to get us to stop touching RUE.    Skin:  R hand somewhat swollen; trace swelling of L hand as well Some bruising noted of LUE ? Restraint, BP cuff? Has rectal tube  Psychiatric:  sedated      Lab Results Last 24 Hours       Results for orders placed or performed during the hospital encounter of 03/24/20 (from the past 24 hour(s))  Glucose, capillary     Status: Abnormal    Collection Time: 04/01/20  7:55 AM  Result Value Ref Range    Glucose-Capillary 186 (H) 70 - 99 mg/dL    Comment 1 Notify RN      Comment 2 Document in Chart    Glucose, capillary     Status: Abnormal    Collection Time: 04/01/20 11:45 AM  Result Value Ref Range    Glucose-Capillary 201 (H) 70 - 99 mg/dL    Comment 1 Notify RN      Comment 2 Document in Chart    Glucose, capillary     Status: Abnormal    Collection Time: 04/01/20  3:27 PM  Result Value Ref Range    Glucose-Capillary 166 (H) 70 - 99 mg/dL    Comment 1 Notify RN      Comment 2 Document in Chart    BMET today at 2000     Status: Abnormal    Collection Time: 04/01/20  7:20 PM  Result Value Ref Range    Sodium 149 (H) 135 - 145 mmol/L    Potassium 3.3 (L) 3.5 - 5.1 mmol/L    Chloride 115 (H) 98 - 111 mmol/L    CO2 28 22 - 32 mmol/L    Glucose, Bld 168 (H) 70 - 99 mg/dL    BUN 19 6 - 20 mg/dL    Creatinine, Ser 1.610.79 0.61 - 1.24 mg/dL    Calcium 8.9 8.9 - 09.610.3 mg/dL    GFR calc non Af Amer >60 >60 mL/min    GFR calc  Af Amer >60 >60 mL/min    Anion gap 6 5 - 15  Glucose, capillary     Status: Abnormal    Collection Time: 04/01/20  7:47 PM  Result Value Ref Range    Glucose-Capillary 163 (H) 70 - 99 mg/dL  Glucose, capillary     Status: Abnormal    Collection Time: 04/01/20 11:34 PM  Result Value Ref Range    Glucose-Capillary 170 (H) 70 - 99 mg/dL  CBC with Differential/Platelet  Status: Abnormal    Collection Time: 04/02/20  3:09 AM  Result Value Ref Range    WBC 10.0 4.0 - 10.5 K/uL    RBC 3.88 (L) 4.22 - 5.81 MIL/uL    Hemoglobin 12.1 (L) 13.0 - 17.0 g/dL    HCT 37.1 (L) 69.6 - 52.0 %    MCV 92.0 80.0 - 100.0 fL    MCH 31.2 26.0 - 34.0 pg    MCHC 33.9 30.0 - 36.0 g/dL    RDW 78.9 38.1 - 01.7 %    Platelets 139 (L) 150 - 400 K/uL    nRBC 0.0 0.0 - 0.2 %    Neutrophils Relative % 77 %    Neutro Abs 7.6 1.7 - 7.7 K/uL    Lymphocytes Relative 15 %    Lymphs Abs 1.4 0.7 - 4.0 K/uL    Monocytes Relative 6 %    Monocytes Absolute 0.6 0.1 - 1.0 K/uL    Eosinophils Relative 2 %    Eosinophils Absolute 0.2 0.0 - 0.5 K/uL    Basophils Relative 0 %    Basophils Absolute 0.0 0.0 - 0.1 K/uL    Immature Granulocytes 0 %    Abs Immature Granulocytes 0.03 0.00 - 0.07 K/uL  Basic metabolic panel     Status: Abnormal    Collection Time: 04/02/20  3:09 AM  Result Value Ref Range    Sodium 147 (H) 135 - 145 mmol/L    Potassium 3.5 3.5 - 5.1 mmol/L    Chloride 116 (H) 98 - 111 mmol/L    CO2 25 22 - 32 mmol/L    Glucose, Bld 190 (H) 70 - 99 mg/dL    BUN 16 6 - 20 mg/dL    Creatinine, Ser 5.10 0.61 - 1.24 mg/dL    Calcium 8.7 (L) 8.9 - 10.3 mg/dL    GFR calc non Af Amer >60 >60 mL/min    GFR calc Af Amer >60 >60 mL/min    Anion gap 6 5 - 15  Phosphorus     Status: Abnormal    Collection Time: 04/02/20  3:09 AM  Result Value Ref Range    Phosphorus 1.9 (L) 2.5 - 4.6 mg/dL  Glucose, capillary     Status: Abnormal    Collection Time: 04/02/20  3:31 AM  Result Value Ref Range     Glucose-Capillary 175 (H) 70 - 99 mg/dL       Imaging Results (Last 48 hours)  DG CHEST PORT 1 VIEW   Result Date: 03/31/2020 CLINICAL DATA:  ET tube EXAM: PORTABLE CHEST 1 VIEW COMPARISON:  03/30/2020 FINDINGS: Endotracheal tube tip is 1.6 cm above the carina. Feeding tube is seen entering the stomach. Low lung volumes with bibasilar atelectasis and mild vascular congestion. No effusions or pneumothorax. No acute bony abnormality. IMPRESSION: Endotracheal tube 1.6 cm above the carina. Low lung volumes with vascular congestion and bibasilar atelectasis. Electronically Signed   By: Charlett Nose M.D.   On: 03/31/2020 08:38    VAS [name] TRANSCRANIAL DOPPLER W BUBBLES   Result Date: 04/01/2020  Transcranial Doppler with Bubble Indications: Stroke. Comparison Study: No prior study Performing Technologist: Gertie Fey MHA, RDMS, RVT, RDCS  Examination Guidelines: A complete evaluation includes B-mode imaging, spectral Doppler, color Doppler, and power Doppler as needed of all accessible portions of each vessel. Bilateral testing is considered an integral part of a complete examination. Limited examinations for reoccurring indications may be performed as noted.  Summary:  A vascular evaluation was  performed. The right middle cerebral artery was studied. An IV was inserted into the patient's right forearm. Verbal informed consent was obtained.  A small amount of HITS (high intensity transient signals) were detected, suggestive of trivial, clinically insignificant PFO (patent foramen ovale). Positive TCD Bubble study indicative of small right to left shunt *See table(s) above for TCD measurements and observations.  Diagnosing physician: Antony Contras MD Electronically signed by Antony Contras MD on 04/01/2020 at 6:53:24 AM.    Final          Assessment/Plan: Diagnosis: dense L MCA occlusion s/p TPA and IR revascularization with R hemiplegia and sedation   1. Does the need for close, 24 hr/day medical  supervision in concert with the patient's rehab needs make it unreasonable for this patient to be served in a less intensive setting? Yes 2. Co-Morbidities requiring supervision/potential complications: DM, HTN, aphasia, R hemiplegia, dysphagia, confusion 3. Due to bladder management, bowel management, safety, skin/wound care, disease management, medication administration, pain management and patient education, does the patient require 24 hr/day rehab nursing? Yes 4. Does the patient require coordinated care of a physician, rehab nurse, therapy disciplines of PT, OT, and SLP to address physical and functional deficits in the context of the above medical diagnosis(es)? Yes Addressing deficits in the following areas: balance, endurance, locomotion, strength, transferring, bowel/bladder control, bathing, dressing, feeding, grooming, toileting, cognition, speech, language and swallowing 5. Can the patient actively participate in an intensive therapy program of at least 3 hrs of therapy per day at least 5 days per week? Yes 6. The potential for patient to make measurable gains while on inpatient rehab is good and fair 7. Anticipated functional outcomes upon discharge from inpatient rehab are supervision and min assist  with PT, supervision and min assist with OT, supervision and min assist with SLP. 8. Estimated rehab length of stay to reach the above functional goals is: 2-3 weeks 9. Anticipated discharge destination: Home 10. Overall Rehab/Functional Prognosis: good and fair   RECOMMENDATIONS: This patient's condition is appropriate for continued rehabilitative care in the following setting: CIR Patient has agreed to participate in recommended program. Potentially Note that insurance prior authorization may be required for reimbursement for recommended care.   Comment:  1. Pt is still sedated- just off ventilator yesterday- in ICU- was started on Amantadine 100 mg BID- suggest changing dosing to AM  and noon, so not awake at night.    2. If BP stays controlled, and doesn't wake up, pt might benefit from low dose ritalin 2.5-5 mg via tube at breakfast time and lunch time.    3. Medical issues currently, per primary team.    4. Will continue to monitor remotely for timing of admission to CIR.    5. Thank you for this consult- he is an appropriate candidate once out of ICU and following some commands.        Lavon Paganini [name], PA-C 04/02/2020    I have personally performed a face to face diagnostic evaluation of this patient and formulated the key components of the plan.  Additionally, I have personally reviewed laboratory data, imaging studies, as well as relevant notes and concur with the physician assistant's documentation above.                Revision History                     Routing History

## 2020-04-16 NOTE — Progress Notes (Signed)
STROKE TEAM PROGRESS NOTE   INTERVAL HISTORY Patient is sitting up in bed.  He is  Is awakeHe remains aphasic with right hemiplegia.  He will likely be transferred to rehab today when bed becomes available.  Discussed with wife.    Vitals:   04/16/20 0325 04/16/20 0400 04/16/20 0734 04/16/20 1126  BP:   129/83 116/69  Pulse:   83 90  Resp:   16 18  Temp:   98.2 F (36.8 C) 98.4 F (36.9 C)  TempSrc:   Oral Axillary  SpO2:  98% 97% 96%  Weight: 75.6 kg     Height:        CBC:  Recent Labs  Lab 04/12/20 0435 04/16/20 0410  WBC 8.2 7.5  NEUTROABS  --  5.0  HGB 12.0* 12.6*  HCT 35.7* 36.9*  MCV 90.2 92.0  PLT 285 198    Basic Metabolic Panel:  Recent Labs  Lab 04/12/20 0435 04/16/20 0410  NA 134* 137  K 3.9 4.1  CL 101 101  CO2 26 27  GLUCOSE 275* 206*  BUN 21* 18  CREATININE 0.87 0.93  CALCIUM 9.0 9.4   IMAGING past 24 hours DG Abd 1 View  Result Date: 04/15/2020 CLINICAL DATA:  Abdominal pain x1 day. EXAM: ABDOMEN - 1 VIEW COMPARISON:  Apr 10, 2020 FINDINGS: A nasogastric tube is seen with its distal tip overlying the expected region of the duodenal bulb. The bowel gas pattern is normal. No radio-opaque calculi or other significant radiographic abnormality are seen. IMPRESSION: Nasogastric tube positioning, as described above. Electronically Signed   By: Aram Candela M.D.   On: 04/15/2020 19:33     PHYSICAL EXAM      General -mildly obese middle-aged Hispanic male, not in acute distress  Ophthalmologic - fundi not visualized due to noncooperation.  Cardiovascular - Regular rhythm and rate.  Abdomen - no tenderness on palpitation of abdomen or lower chest, rib  Neuro - eyes open but global aphasia, did not follow  most commands except  Few midline and simple one-step commands Eyes left gaze deviation able to cross midline slightly, not blinking to visual threat on the right, blinking to threat on the left,  able to track on the left but not right. Mild  lower right facial droop.  Tongue protrusion not cooperative. Spontaneous movement of LUE and LLE, LUE at least 4/5, no drift when lifted in air. LLE 3/5 with pain stimulation. RUE flaccid, right-sided neglect, RLE slight withdraw to pain. DTR 1+. Right upgoing toe, left downgoing toe. Sensation, coordination and gait not tested.   ASSESSMENT/PLAN Brad Delacruz is a 59 y.o. male with history of HTN and DB noncompliant w/ medications presenting to [facility] with R sided weakness and inability to talk. NIH 28. CTA showed L M1 occlusion. Transferred to [facility]. On arrival, received IV tPA 03/24/2020 at 0725 followed by mechanical thrombectomy w/ TICI2C revascularization and rescue stent placement. Admitted to the ICU. transititioned out with transfer back to neuro ICU 5/1 with worsening edema for hypertonic saline protocol.    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   Therapy recommendations: CIR  Disposition:  pending    Medically ready for d/c to rehab once bed found  Hypertension  Cozaar 50 daily  Hyperlipidemia  On lipitor 80  Diabetes type II Uncontrolled  Noncompliant w/ meds PTA  Lantus 30-> 35 bid, change novolog 5 u bid at 1800 and 0000 for HS TF coverage  CBGs  SSI 0-15  DM coordinator on board  Dysphagia . Secondary to stroke . Now on dys 1 and honey thick liquid . on nocturnal feeding . Has Cortrak . Speech on board . Dietitian consulted for calore count - keep hs TF . Encourage po intake . Free water - 100 q6h   Other Stroke Risk  Factors  Hx ETOH use  Substance abuse - UDS:  Benzos POSITIVE  Overweight, Body mass index is 27.73 kg/m., recommend weight loss, diet and exercise as appropriate   Other Active Problems  Code status - Full code  Urinary retention - I&O cath, foley prn  Pt seems to hold his stomach with grimace while working with PT/OT. Wife said pt had rib fracture in the past. CXR and KUB unremarkable.   Hospital D# 23   Patient is yet awaiting bed in inpatient rehab.  I spoke to his   wife about likely possibility of inpatient rehab   at [facility] and she is agreeable.  Encourage oral intake during the day.  Continue ongoing therapies.  Medically stable to be transferred to inpatient rehab when bed available.    Brad Contras, MD Medical Director [place] Pager: [phone] 04/16/2020 2:20 PM Hospital day # 23      To contact Stroke Continuity provider, please refer to [email or web address] After hours, contact General Neurology35

## 2020-04-16 NOTE — Progress Notes (Signed)
Inpatient Diabetes Program Recommendations  AACE/ADA: New Consensus Statement on Inpatient Glycemic Control (2015)  Target Ranges:  Prepandial:   less than 140 mg/dL      Peak postprandial:   less than 180 mg/dL (1-2 hours)      Critically ill patients:  140 - 180 mg/dL   Lab Results  Component Value Date   GLUCAP 187 (H) 04/16/2020   HGBA1C 9.2 (H) 03/24/2020    Review of Glycemic Control  Results for Brad Delacruz, Brad Delacruz (MRN [id number]) as of 04/16/2020 10:36  Ref. Range 04/15/2020 11:17 04/15/2020 17:19 04/15/2020 19:37 04/15/2020 21:19 04/16/2020 06:07  Glucose-Capillary Latest Ref Range: 70 - 99 mg/dL 172 (H) 091 (H) 068 (H) 181 (H) 187 (H)    Inpatient Diabetes Program Recommendations:  Please discontinue novolog 5 units BID as this was for tube feed coverage and tube feeds have been discontinued.  May need meal coverage TID.  Will continue to follow  Thank you, Dulce Sellar, RN, BSN Diabetes Coordinator Inpatient Diabetes Program [phone] (team pager from 8a-5p)

## 2020-04-16 NOTE — Progress Notes (Signed)
Patient's daughter called the unit stating the wife said her husband was grimacing and in pain. Upon entering the room the patient is grimacing and clutching his abdomen. Nurse uses translator to speak with wife and patient to try and pin point where the pain is. Patient describes sharp pain to lower right abdomen and also along right rib cage. Wife is very concerned and is asking for the doctor to take an xray of his right ribs because she believes he may have broken something. Nurse stated she would write progress note so doctor could address in the morning.  Meredeth Ide Shaye Elling

## 2020-04-16 NOTE — H&P (Signed)
Physical Medicine and Rehabilitation Admission H&P  CC: Left MCA stroke  HPI: Brad Delacruz is a 59 year old right-handed male history of diabetes mellitus and hypertension.  Per chart review lives with spouse independent prior to admission.  Presented 03/24/2020 with right-sided weakness and aphasia to [place].  Admission chemistries with alcohol negative, sodium 130, potassium 3.3, glucose 309, hemoglobin A1c 9.2, urine drug screen positive benzos.  Cranial CT scan negative for acute changes.  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  CT cerebral perfusion scan as well as CT angiogram of head and neck showed a large left MCA territory nonhemorrhagic infarction as well as acute left M1 large vessel occlusion.  Large penumbra with mismatch volume of 99 mL.  Patient was transferred to [facility].  Patient underwent TPA followed by mechanical thrombectomy revascularization per interventional radiology.  Echocardiogram with ejection fraction of 60% grade 1 diastolic dysfunction.  TCD bubble study was positive for small PFO and lower extremity Dopplers negative for DVT.  Patient remained intubated through 04/01/2020.    Latest follow-up cranial CT scan 04/08/2020 shows evolving large left MCA territory infarct with similar mass-effect no new hemorrhage or hydrocephalus.  Maintained on low-dose aspirin as well as Brilinta for CVA prophylaxis.  Considerations to be made for TEE and loop recorder as outpatient.  Subcutaneous heparin for DVT prophylaxis.  Nasogastric tube placed for nutritional support changed to nocturnal and diet currently advanced to dysphagia #1 honey thick liquid and nasogastric tube has been removed.  He did complete a course of Ancef 03/31/2020 to 04/05/2020 for pneumonia.  Urine culture 04/14/2020 multiple species intravenous Rocephin discontinued.  Therapy evaluations completed and patient was admitted for a comprehensive rehab program.  Review of  Systems  Constitutional: Positive for fever.  HENT: Negative for hearing loss.   Eyes: Negative for blurred vision and double vision.  Respiratory: Negative for cough and shortness of breath.   Cardiovascular: Negative for chest pain, palpitations and leg swelling.  Gastrointestinal: Positive for constipation. Negative for heartburn, nausea and vomiting.  Genitourinary: Negative for dysuria, flank pain and hematuria.  Musculoskeletal: Positive for myalgias.  Skin: Negative for rash.  Neurological: Positive for speech change, weakness and headaches.  All other systems reviewed and are negative.  Past Medical History:  Diagnosis Date  . Diabetes mellitus without complication [facility])    Past Surgical History:  Procedure Laterality Date  . IR CT HEAD LTD  03/24/2020  . IR CT HEAD LTD  03/24/2020  . IR INTRA CRAN STENT  03/24/2020  . IR PATIENT EVAL TECH 0-60 MINS  03/25/2020  . IR PERCUTANEOUS ART THROMBECTOMY/INFUSION INTRACRANIAL INC DIAG ANGIO  03/24/2020  . RADIOLOGY WITH ANESTHESIA N/A 03/24/2020   Procedure: IR WITH ANESTHESIA;  Surgeon: Julieanne Cotton, MD;  Location: [facility] OR;  Service: Radiology;  Laterality: N/A;   History reviewed. No pertinent family history. Social History:  reports that he has never smoked. He has never used smokeless tobacco. He reports previous alcohol use. He reports that he does not use drugs. Allergies: No Known Allergies Medications Prior to Admission  Medication Sig Dispense Refill  . amantadine (SYMMETREL) 50 MG/5ML solution Take 10 mLs (100 mg total) by mouth 2 (two) times daily. 140 mL 0  . [START ON 04/17/2020] aspirin 81 MG chewable tablet Chew 1 tablet (81 mg total) by mouth daily.    Melene Muller ON 04/17/2020] atorvastatin (LIPITOR) 80 MG tablet Take 1 tablet (80 mg total) by mouth daily.    Marland Kitchen  cefTRIAXone 1 g in sodium chloride 0.9 % 100 mL Inject 1 g into the vein daily.    . chlorhexidine (PERIDEX) 0.12 % solution Use as directed 15 mLs in the mouth  or throat 2 (two) times daily. 120 mL 0  . [START ON 04/17/2020] Chlorhexidine Gluconate Cloth 2 % PADS Apply 6 each topically daily.    . feeding supplement, ENSURE ENLIVE, (ENSURE ENLIVE) LIQD Take 237 mLs by mouth 2 (two) times daily between meals. 237 mL 12  . heparin 5000 UNIT/ML injection Inject 1 mL (5,000 Units total) into the skin every 8 (eight) hours. 1 mL   . Hydrocortisone (GERHARDT'S BUTT CREAM) CREA Apply 1 application topically as needed for irritation.    . insulin aspart (NOVOLOG) 100 UNIT/ML injection Inject 0-15 Units into the skin 3 (three) times daily with meals. 10 mL 11  . insulin glargine (LANTUS) 100 UNIT/ML injection Inject 0.35 mLs (35 Units total) into the skin 2 (two) times daily. 10 mL 11  . [START ON 04/17/2020] losartan (COZAAR) 50 MG tablet Take 1 tablet (50 mg total) by mouth daily.    . Maltodextrin-Xanthan Gum (RESOURCE THICKENUP CLEAR) POWD Take 120 g by mouth as needed (nectar thick liquid).    . mouth rinse LIQD solution 15 mLs by Mouth Rinse route 2 times daily at 12 noon and 4 pm.  0  . pantoprazole sodium (PROTONIX) 40 mg/20 mL PACK Take 20 mLs (40 mg total) by mouth at bedtime. 30 mL   . ticagrelor (BRILINTA) 90 MG TABS tablet Take 1 tablet (90 mg total) by mouth 2 (two) times daily. 60 tablet     Drug Regimen Review Drug regimen was reviewed and remains appropriate with no significant issues identified  Home: Home Living Family/patient expects to be discharged to:: Private residence Living Arrangements: Spouse/significant other, Children Available Help at Discharge: Family, Available 24 hours/day Type of Home: House Home Access: Stairs to enter [employer] of Steps: 2-3 back steps Entrance Stairs-Rails: None Home Layout: One level Bathroom Shower/Tub: Engineer, manufacturing systems: Standard Home Equipment: Crutches Additional Comments: information of home from chart as family is gone and pt not very alert  Lives With: Family     Functional History: Prior Function Level of Independence: Independent Comments: maintenance job  Functional Status:  Mobility: Bed Mobility Overal bed mobility: Needs Assistance Bed Mobility: Rolling, Supine to Sit, Sit to Sidelying Rolling: Max assist, Mod assist Sidelying to sit: Total assist, +2 for physical assistance Supine to sit: +2 for physical assistance, Max assist Sit to supine: Max assist, +2 for physical assistance Sit to sidelying: +2 for physical assistance, Max assist General bed mobility comments: cues for sequencing, assist for all aspects of mobility. Pt able to roll right mod assist and left max assist. Transfers Overall transfer level: Needs assistance Equipment used: 2 person hand held assist Transfers: Sit to/from Stand Sit to Stand: +2 physical assistance, Max assist General transfer comment: Attempted sit to stand +2 max assist without success. Pt unable to clear hips from bed. Ambulation/Gait General Gait Details: unable  ADL: ADL Overall ADL's : Needs assistance/impaired Eating/Feeding: NPO Grooming: Wash/dry face, Minimal assistance Grooming Details (indicate cue type and reason): pt given cues then cloth brought to face adn pt wiped out his eyes Lower Body Dressing: Total assistance Toileting- Clothing Manipulation and Hygiene: Total assistance Toileting - Clothing Manipulation Details (indicate cue type and reason): Pt standing and indicating through gestures, increased restlessness that he needed something prior to his catheter  busting and urine pouring on floor Functional mobility during ADLs: Maximal assistance, +2 for physical assistance, Total assistance General ADL Comments: Pt unable to follow commands to complete ADLs, despite using spanish prompts, due to increased pushing at EOB, bed level exercises completed  Cognition: Cognition Overall Cognitive Status: Impaired/Different from baseline Arousal/Alertness: Lethargic Orientation  Level: (response to voice) Attention: Sustained Sustained Attention: Impaired Sustained Attention Impairment: Functional basic Cognition Arousal/Alertness: Awake/alert Behavior During Therapy: Flat affect Overall Cognitive Status: Impaired/Different from baseline Area of Impairment: Problem solving, Awareness, Following commands, Attention, Orientation Orientation Level: Place, Time, Situation, Disoriented to Current Attention Level: Focused Memory: Decreased recall of precautions, Decreased short-term memory Following Commands: Follows one step commands inconsistently Safety/Judgement: Decreased awareness of safety, Decreased awareness of deficits Awareness: Intellectual Problem Solving: Slow processing, Decreased initiation, Difficulty sequencing, Requires tactile cues, Requires verbal cues General Comments: Following simple commands approx 25% of trials. Difficult to discern if cognition or language barrier. Difficult to assess due to: Non-English speaking, Impaired communication   Physical Exam: There were no vitals taken for this visit. General: NAD, sitting up in bed HEENT: Head is normocephalic, atraumatic, sclera anicteric, oral mucosa pink and moist, dentition intact, ext ear canals clear, left gaze deviation Neck: Supple without JVD or lymphadenopathy Heart: Reg rate and rhythm. No murmurs rubs or gallops Chest: CTA bilaterally without wheezes, rales, or rhonchi; no distress Abdomen: Soft, non-tender, non-distended, bowel sounds positive. Extremities: No clubbing, cyanosis, or edema. Pulses are 2+ Skin: Clean and intact without signs of breakdown Neuro/Musculoskeletal: Global aphasia expressive worse than receptive. Follows simple commands. Mild right sided facial droop. Right sided neglect. LUE: 4/5, LLE 3/5, Right side 0/5 throughout.  Psych: Pt's affect is appropriate. Pt is cooperative  Results for orders placed or performed during the hospital encounter of 03/24/20  (from the past 48 hour(s))  Glucose, capillary     Status: Abnormal   Collection Time: 04/14/20  7:32 PM  Result Value Ref Range   Glucose-Capillary 203 (H) 70 - 99 mg/dL    Comment: Glucose reference range applies only to samples taken after fasting for at least 8 hours.   Comment 1 Notify RN    Comment 2 Document in Chart   Glucose, capillary     Status: Abnormal   Collection Time: 04/14/20 11:06 PM  Result Value Ref Range   Glucose-Capillary 259 (H) 70 - 99 mg/dL    Comment: Glucose reference range applies only to samples taken after fasting for at least 8 hours.   Comment 1 Notify RN    Comment 2 Document in Chart   Glucose, capillary     Status: Abnormal   Collection Time: 04/15/20  3:17 AM  Result Value Ref Range   Glucose-Capillary 221 (H) 70 - 99 mg/dL    Comment: Glucose reference range applies only to samples taken after fasting for at least 8 hours.   Comment 1 Notify RN    Comment 2 Document in Chart   Glucose, capillary     Status: Abnormal   Collection Time: 04/15/20  8:19 AM  Result Value Ref Range   Glucose-Capillary 172 (H) 70 - 99 mg/dL    Comment: Glucose reference range applies only to samples taken after fasting for at least 8 hours.   Comment 1 Notify RN    Comment 2 Document in Chart   Glucose, capillary     Status: Abnormal   Collection Time: 04/15/20 11:17 AM  Result Value Ref Range   Glucose-Capillary 231 (H) 70 -  99 mg/dL    Comment: Glucose reference range applies only to samples taken after fasting for at least 8 hours.   Comment 1 Notify RN    Comment 2 Document in Chart   Glucose, capillary     Status: Abnormal   Collection Time: 04/15/20  5:19 PM  Result Value Ref Range   Glucose-Capillary 232 (H) 70 - 99 mg/dL    Comment: Glucose reference range applies only to samples taken after fasting for at least 8 hours.   Comment 1 Notify RN    Comment 2 Document in Chart   Glucose, capillary     Status: Abnormal   Collection Time: 04/15/20  7:37 PM    Result Value Ref Range   Glucose-Capillary 277 (H) 70 - 99 mg/dL    Comment: Glucose reference range applies only to samples taken after fasting for at least 8 hours.   Comment 1 Notify RN    Comment 2 Document in Chart   Glucose, capillary     Status: Abnormal   Collection Time: 04/15/20  9:19 PM  Result Value Ref Range   Glucose-Capillary 181 (H) 70 - 99 mg/dL    Comment: Glucose reference range applies only to samples taken after fasting for at least 8 hours.   Comment 1 Notify RN    Comment 2 Document in Chart   Comprehensive metabolic panel     Status: Abnormal   Collection Time: 04/16/20  4:10 AM  Result Value Ref Range   Sodium 137 135 - 145 mmol/L   Potassium 4.1 3.5 - 5.1 mmol/L   Chloride 101 98 - 111 mmol/L   CO2 27 22 - 32 mmol/L   Glucose, Bld 206 (H) 70 - 99 mg/dL    Comment: Glucose reference range applies only to samples taken after fasting for at least 8 hours.   BUN 18 6 - 20 mg/dL   Creatinine, Ser 0.93 0.61 - 1.24 mg/dL   Calcium 9.4 8.9 - 10.3 mg/dL   Total Protein 6.2 (L) 6.5 - 8.1 g/dL   Albumin 2.5 (L) 3.5 - 5.0 g/dL   AST 32 15 - 41 U/L   ALT 51 (H) 0 - 44 U/L   Alkaline Phosphatase 103 38 - 126 U/L   Total Bilirubin 0.8 0.3 - 1.2 mg/dL   GFR calc non Af Amer >60 >60 mL/min   GFR calc Af Amer >60 >60 mL/min   Anion gap 9 5 - 15    Comment: Performed at [facility] [address] [place], [place] [address]  CBC with Differential/Platelet     Status: Abnormal   Collection Time: 04/16/20  4:10 AM  Result Value Ref Range   WBC 7.5 4.0 - 10.5 K/uL   RBC 4.01 (L) 4.22 - 5.81 MIL/uL   Hemoglobin 12.6 (L) 13.0 - 17.0 g/dL   HCT 36.9 (L) 39.0 - 52.0 %   MCV 92.0 80.0 - 100.0 fL   MCH 31.4 26.0 - 34.0 pg   MCHC 34.1 30.0 - 36.0 g/dL   RDW 13.3 11.5 - 15.5 %   Platelets 198 150 - 400 K/uL   nRBC 0.0 0.0 - 0.2 %   Neutrophils Relative % 65 %   Neutro Abs 5.0 1.7 - 7.7 K/uL   Lymphocytes Relative 22 %   Lymphs Abs 1.7 0.7 - 4.0 K/uL    Monocytes Relative 7 %   Monocytes Absolute 0.5 0.1 - 1.0 K/uL   Eosinophils Relative 4 %  Eosinophils Absolute 0.3 0.0 - 0.5 K/uL   Basophils Relative 1 %   Basophils Absolute 0.1 0.0 - 0.1 K/uL   Immature Granulocytes 1 %   Abs Immature Granulocytes 0.04 0.00 - 0.07 K/uL    Comment: Performed at [facility], [address], [place], [place] [address]  Glucose, capillary     Status: Abnormal   Collection Time: 04/16/20  6:07 AM  Result Value Ref Range   Glucose-Capillary 187 (H) 70 - 99 mg/dL    Comment: Glucose reference range applies only to samples taken after fasting for at least 8 hours.  Glucose, capillary     Status: Abnormal   Collection Time: 04/16/20 11:25 AM  Result Value Ref Range   Glucose-Capillary 286 (H) 70 - 99 mg/dL    Comment: Glucose reference range applies only to samples taken after fasting for at least 8 hours.   Comment 1 Notify RN    Comment 2 Document in Chart    DG Abd 1 View  Result Date: 04/15/2020 CLINICAL DATA:  Abdominal pain x1 day. EXAM: ABDOMEN - 1 VIEW COMPARISON:  Apr 10, 2020 FINDINGS: A nasogastric tube is seen with its distal tip overlying the expected region of the duodenal bulb. The bowel gas pattern is normal. No radio-opaque calculi or other significant radiographic abnormality are seen. IMPRESSION: Nasogastric tube positioning, as described above. Electronically Signed   By: Aram Candela M.D.   On: 04/15/2020 19:33    Medical Problem List and Plan: 1.  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  TEE and loop recorder placement to be discussed as outpatient.  -patient may shower  -ELOS/Goals: 20-22 days [facility] PT, OT, SLP 2.  Antithrombotics: -DVT/anticoagulation: Subcutaneous heparin.  Venous Doppler studies negative  -antiplatelet therapy: Brilinta 90 mg twice daily, aspirin 81 mg daily 3. Pain Management: Tylenol as  needed. Well controlled.  4. Mood: Amantadine 100 mg twice daily  -antipsychotic agents: N/A 5. Neuropsych: This patient is not capable of making decisions on his own behalf. 6. Skin/Wound Care: Routine skin checks 7. Fluids/Electrolytes/Nutrition: Routine in and outs with follow-up chemistries 8.  Dysphagia.  Dysphagia #1 honey thick liquids.   Dietary follow-up as well as speech therapy 9.  Diabetes mellitus.  Hemoglobin A1c 9.2.  NovoLog 5 units 2 times daily, Lantus insulin 35 units twice daily. Uncontrolled. Increase Lantus to 36U.  10.  Hypertension.  Cozaar 50 mg daily. Well controlled 11.  Hyperlipidemia.  Lipitor 12.  Obesity.  BMI 29.50.  Dietary follow-up  Charlton Amor, PA-C 04/10/2020   I have personally performed a face to face diagnostic evaluation, including, but not limited to relevant history and physical exam findings, of this patient and developed relevant assessment and plan.  Additionally, I have reviewed and concur with the physician assistant's documentation above.  The patient's status has not changed. The original post admission physician evaluation remains appropriate, and any changes from the pre-admission screening or documentation from the acute chart are noted above.   Sula Soda, MD

## 2020-04-16 NOTE — Progress Notes (Signed)
Patient arrived on unit, oriented to unit. Reviewed medications, therapy schedule, rehab routine and plan of care. Patient is mute, but shook head yes or no when asked questions. Patient's wife is in the room, who speak spanish only, used translator to answer all questions she had about her husband's care and rehab routine. No complications noted at this time and patient states he has no pain at this moment. Meredeth Ide Venson Ferencz

## 2020-04-16 NOTE — TOC Transition Note (Signed)
Transition of Care [facility]) - CM/SW Discharge Note   Patient Details  Name: Brad Delacruz MRN: [id number] Date of Birth: [birth year]  [facility] CM/SW Contact:  Kermit Balo, RN Phone Number: 04/16/2020, 10:45 AM   Clinical Narrative:    Pt discharging to CIR today. CM signing off.   Final next level of care: IP Rehab Facility Barriers to Discharge: No Barriers Identified   Patient Goals and CMS Choice        Discharge Placement                       Discharge Plan and Services   Discharge Planning Services: CM Consult                                 Social Determinants of Health (SDOH) Interventions     Readmission Risk Interventions No flowsheet data found.

## 2020-04-16 NOTE — Progress Notes (Signed)
Inpatient Rehabilitation Admissions Coordinator  I have insurance approval and CIR bed to admit patient to today. I spoke with pt's daughter, Verlee Monte by phone and she is in agreement. I have alerted Dr. Pearlean Brownie, acute team and [facility] team. I will make the arrangements to admit today.  Ottie Glazier, RN, MSN Rehab Admissions Coordinator [phone] 04/16/2020 10:47 AM

## 2020-04-16 NOTE — Progress Notes (Signed)
Patient going to CIR, report given to receiving RN. Belongings returned to patient

## 2020-04-16 NOTE — Progress Notes (Signed)
Physical Therapy Treatment Patient Details Name: Brad Delacruz MRN: [id number] DOB: 06-03-1961 Today's Date: 04/16/2020    History of Present Illness 59 y.o. male past medical history of diabetes, hypertension which he does not take medication for, presented to the emergency room at [facility] with last known well of 5 AM when he was at work and was noted to have a sudden onset of right-sided weakness and inability to talk. Pt given TPA and s/p thrombectomy on 4/20.  Was intubated and restrained as of 4/22,  extubated 4/28 and now drowsy.  L MCA revascularization with tPA, acute respiratory failure from possible Asp PNA, now referred to rehab.  PMHx:  DM, HTN,  On 5/2 pt with worsening of bleed to 56m, transferred to ICU and placed on 3% saline.    PT Comments    Patient seen for mobility progression. Pt able to stand X 2 trials with Stedy standing frame and max A +2. Current plan remains appropriate.    Follow Up Recommendations  CIR;Supervision/Assistance - 24 hour     Equipment Recommendations  Other (comment)(TBD at post-acute venue)    Recommendations for Other Services Rehab consult     Precautions / Restrictions Precautions Precautions: Fall Precaution Comments: cortrak Restrictions Weight Bearing Restrictions: No    Mobility  Bed Mobility Overal bed mobility: Needs Assistance Bed Mobility: Rolling;Sidelying to Sit;Sit to Sidelying Rolling: Mod assist Sidelying to sit: Max assist     Sit to sidelying: Total assist;+2 for physical assistance General bed mobility comments: multimodal cues for sequencing; use of rail with L UE; assist to bring bilat LE/hips to EOB with bed pad and elevate trunk into sitting  Transfers Overall transfer level: Needs assistance   Transfers: Sit to/from Stand Sit to Stand: Max assist;+2 physical assistance         General transfer comment: stood X 2 from EOB; assist to power up into standing, support R UE, and maintain  upright midline posture; pt requires tactiles cues/assist to flex L knee and elbow as pt pushing to R side on standing frame  Ambulation/Gait             General Gait Details: unable   Stairs             Wheelchair Mobility    Modified Rankin (Stroke Patients Only) Modified Rankin (Stroke Patients Only) Pre-Morbid Rankin Score: No symptoms Modified Rankin: Severe disability     Balance Overall balance assessment: Needs assistance Sitting-balance support: Feet supported;Single extremity supported Sitting balance-Leahy Scale: Zero Sitting balance - Comments: pt requires varying levels of assist to maintain sitting balance EOB as pt pushes with L UE; requires tactile cues/assist for L elbow flexion; worked on lateral trunk flexion and rotation; attempted use of mirror for midline position however pt unable to focus on mirror  Postural control: Right lateral lean(pushing with LUE) Standing balance support: Bilateral upper extremity supported Standing balance-Leahy Scale: Zero                              Cognition Arousal/Alertness: Awake/alert Behavior During Therapy: Flat affect Overall Cognitive Status: Difficult to assess Area of Impairment: Attention;Problem solving;Following commands;Safety/judgement                   Current Attention Level: Sustained Memory: Decreased recall of precautions;Decreased short-term memory Following Commands: Follows one step commands inconsistently;Follows one step commands with increased time Safety/Judgement: Decreased awareness of safety;Decreased awareness of deficits  Awareness: Intellectual Problem Solving: Slow processing;Decreased initiation;Difficulty sequencing;Requires verbal cues;Requires tactile cues General Comments: daughter interpreted during session; pt did not attempt to verbalize      Exercises      General Comments        Pertinent Vitals/Pain Pain Assessment: Faces Faces Pain Scale:  Hurts little more Pain Location: R LQ Pain Descriptors / Indicators: Grimacing;Sore Pain Intervention(s): Monitored during session;Repositioned    Home Living                      Prior Function            PT Goals (current goals can now be found in the care plan section) Progress towards PT goals: Progressing toward goals    Frequency    Min 4X/week      PT Plan Current plan remains appropriate    Co-evaluation              AM-PAC PT "6 Clicks" Mobility   Outcome Measure  Help needed turning from your back to your side while in a flat bed without using bedrails?: A Lot Help needed moving from lying on your back to sitting on the side of a flat bed without using bedrails?: A Lot Help needed moving to and from a bed to a chair (including a wheelchair)?: Total Help needed standing up from a chair using your arms (e.g., wheelchair or bedside chair)?: Total Help needed to walk in hospital room?: Total Help needed climbing 3-5 steps with a railing? : Total 6 Click Score: 8    End of Session Equipment Utilized During Treatment: Gait belt Activity Tolerance: Patient tolerated treatment well Patient left: in bed;with call bell/phone within reach;with bed alarm set;with family/visitor present Nurse Communication: Mobility status;Need for lift equipment PT Visit Diagnosis: Muscle weakness (generalized) (M62.81);Other symptoms and signs involving the nervous system (R29.898);Hemiplegia and hemiparesis Hemiplegia - Right/Left: Right Hemiplegia - dominant/non-dominant: Dominant Hemiplegia - caused by: Cerebral infarction     Time: 1610-9604 PT Time Calculation (min) (ACUTE ONLY): 22 min  Charges:  $Therapeutic Activity: 8-22 mins                     Erline Levine, PTA Acute Rehabilitation Services Pager: [phone] Office: [phone]     Carolynne Edouard 04/16/2020, 10:48 AM

## 2020-04-16 NOTE — Discharge Summary (Addendum)
Stroke Discharge Summary  Patient ID: Brad Delacruz   MRN: [id number]      DOB: [birth year]  Date of Admission: 03/24/2020 Date of Discharge: 04/16/2020  Attending Physician:  Micki Riley, MD, Stroke MD Consultant(s):   Audie Box DO (pulmonary/intensive care), [name] MD (Physical Medicine & Rehabtilitation)  Patient's PCP:  Benita Stabile, MD  Discharge Diagnoses:  Principal Problem:   Acute ischemic stroke (HCC) L MCA d/t L M1 oclusion s/p MCA stent Active Problems:   Middle cerebral artery embolism, left   Acute respiratory failure (HCC)   SAH (subarachnoid hemorrhage) (HCC)   Cerebral edema (HCC)   Pneumonia   Hypertensive emergency   Hyperlipidemia   Diabetes mellitus type II, uncontrolled (HCC)   Dysphagia   Protein-calorie malnutrition (HCC)   Urinary retention   Medications to be continued on Rehab Allergies as of 04/16/2020   No Known Allergies      Medication List     STOP taking these medications    ciclopirox 8 % solution Commonly known as: PENLAC   Farxiga 10 MG Tabs tablet Generic drug: dapagliflozin propanediol   glipiZIDE 5 MG 24 hr tablet Commonly known as: GLUCOTROL XL   meloxicam 15 MG tablet Commonly known as: MOBIC   pioglitazone 30 MG tablet Commonly known as: ACTOS   rosuvastatin 5 MG tablet Commonly known as: CRESTOR   Xigduo XR 09-999 MG Tb24 Generic drug: Dapagliflozin-metFORMIN HCl ER       TAKE these medications    amantadine 50 MG/5ML solution Commonly known as: SYMMETREL Take 10 mLs (100 mg total) by mouth 2 (two) times daily.   aspirin 81 MG chewable tablet Chew 1 tablet (81 mg total) by mouth daily. Start taking on: Apr 17, 2020   atorvastatin 80 MG tablet Commonly known as: LIPITOR Take 1 tablet (80 mg total) by mouth daily. Start taking on: Apr 17, 2020   cefTRIAXone 1 g in sodium chloride 0.9 % 100 mL Inject 1 g into the vein daily.   chlorhexidine 0.12 % solution Commonly known as:  PERIDEX Use as directed 15 mLs in the mouth or throat 2 (two) times daily.   Chlorhexidine Gluconate Cloth 2 % Pads Apply 6 each topically daily. Start taking on: Apr 17, 2020   feeding supplement (ENSURE ENLIVE) Liqd Take 237 mLs by mouth 2 (two) times daily between meals.   Gerhardt's butt cream Crea Apply 1 application topically as needed for irritation.   heparin 5000 UNIT/ML injection Inject 1 mL (5,000 Units total) into the skin every 8 (eight) hours.   insulin aspart 100 UNIT/ML injection Commonly known as: novoLOG Inject 0-15 Units into the skin 3 (three) times daily with meals.   insulin glargine 100 UNIT/ML injection Commonly known as: LANTUS Inject 0.35 mLs (35 Units total) into the skin 2 (two) times daily.   losartan 50 MG tablet Commonly known as: COZAAR Take 1 tablet (50 mg total) by mouth daily. Start taking on: Apr 17, 2020 What changed:  medication strength how much to take   mouth rinse Liqd solution 15 mLs by Mouth Rinse route 2 times daily at 12 noon and 4 pm.   pantoprazole sodium 40 mg/20 mL Pack Commonly known as: PROTONIX Take 20 mLs (40 mg total) by mouth at bedtime.   Resource ThickenUp Clear Powd Take 120 g by mouth as needed (nectar thick liquid).   ticagrelor 90 MG Tabs tablet Commonly known as: BRILINTA Take 1 tablet (90 mg  total) by mouth 2 (two) times daily.        LABORATORY STUDIES CBC    Component Value Date/Time   WBC 7.5 04/16/2020 0410   RBC 4.01 (L) 04/16/2020 0410   HGB 12.6 (L) 04/16/2020 0410   HCT 36.9 (L) 04/16/2020 0410   PLT 198 04/16/2020 0410   MCV 92.0 04/16/2020 0410   MCH 31.4 04/16/2020 0410   MCHC 34.1 04/16/2020 0410   RDW 13.3 04/16/2020 0410   LYMPHSABS 1.7 04/16/2020 0410   MONOABS 0.5 04/16/2020 0410   EOSABS 0.3 04/16/2020 0410   BASOSABS 0.1 04/16/2020 0410   CMP    Component Value Date/Time   NA 137 04/16/2020 0410   K 4.1 04/16/2020 0410   CL 101 04/16/2020 0410   CO2 27 04/16/2020  0410   GLUCOSE 206 (H) 04/16/2020 0410   BUN 18 04/16/2020 0410   CREATININE 0.93 04/16/2020 0410   CALCIUM 9.4 04/16/2020 0410   PROT 6.2 (L) 04/16/2020 0410   ALBUMIN 2.5 (L) 04/16/2020 0410   AST 32 04/16/2020 0410   ALT 51 (H) 04/16/2020 0410   ALKPHOS 103 04/16/2020 0410   BILITOT 0.8 04/16/2020 0410   GFRNONAA >60 04/16/2020 0410   GFRAA >60 04/16/2020 0410   COAGS Lab Results  Component Value Date   INR 1.0 03/24/2020   Lipid Panel    Component Value Date/Time   CHOL 180 03/25/2020 0307   TRIG 119 03/30/2020 0418   HDL 21 (L) 03/25/2020 0307   CHOLHDL 8.6 03/25/2020 0307   VLDL UNABLE TO CALCULATE IF TRIGLYCERIDE OVER 400 mg/dL 16/09/9603 5409   LDLCALC UNABLE TO CALCULATE IF TRIGLYCERIDE OVER 400 mg/dL 81/19/1478 2956   OZHY8M  Lab Results  Component Value Date   HGBA1C 9.2 (H) 03/24/2020   Urinalysis    Component Value Date/Time   COLORURINE AMBER (A) 04/14/2020 1530   APPEARANCEUR TURBID (A) 04/14/2020 1530   LABSPEC 1.019 04/14/2020 1530   PHURINE 6.0 04/14/2020 1530   GLUCOSEU NEGATIVE 04/14/2020 1530   HGBUR SMALL (A) 04/14/2020 1530   BILIRUBINUR NEGATIVE 04/14/2020 1530   KETONESUR NEGATIVE 04/14/2020 1530   PROTEINUR 100 (A) 04/14/2020 1530   NITRITE POSITIVE (A) 04/14/2020 1530   LEUKOCYTESUR MODERATE (A) 04/14/2020 1530   Urine Drug Screen     Component Value Date/Time   LABOPIA NONE DETECTED 03/25/2020 0229   COCAINSCRNUR NONE DETECTED 03/25/2020 0229   LABBENZ POSITIVE (A) 03/25/2020 0229   AMPHETMU NONE DETECTED 03/25/2020 0229   THCU NONE DETECTED 03/25/2020 0229   LABBARB NONE DETECTED 03/25/2020 0229    Alcohol Level    Component Value Date/Time   ETH <10 03/24/2020 0601    SIGNIFICANT DIAGNOSTIC STUDIES CT Angio Head W or Wo Contrast  Result Date: 03/24/2020 CLINICAL DATA:  Patient found down at 5 a.m. Right-sided weakness. Abnormal speech. Facial droop. EXAM: CT ANGIOGRAPHY HEAD AND NECK CT PERFUSION BRAIN TECHNIQUE:  Multidetector CT imaging of the head and neck was performed using the standard protocol during bolus administration of intravenous contrast. Multiplanar CT image reconstructions and MIPs were obtained to evaluate the vascular anatomy. Carotid stenosis measurements (when applicable) are obtained utilizing NASCET criteria, using the distal internal carotid diameter as the denominator. Multiphase CT imaging of the brain was performed following IV bolus contrast injection. Subsequent parametric perfusion maps were calculated using RAPID software. CONTRAST:  OMNIPAQUE IOHEXOL 350 MG/ML SOLN COMPARISON:  CT head without contrast 03/24/2020 FINDINGS: CTA NECK FINDINGS Aortic arch: A 3 vessel arch configuration  is present. Atherosclerotic calcifications are present at the aortic arch. No aneurysm or significant stenosis is present. Right carotid system: Right common carotid artery is within normal limits. Atherosclerotic changes are present at the bifurcation without significant stenosis. Study is mildly degraded by dental artifact. Left carotid system: The left common carotid artery is within normal limits. Atherosclerotic changes are present at the proximal left ICA without a significant stenosis relative to the more distal vessel. Vertebral arteries: The right vertebral artery is the dominant vessel. Both vertebral arteries originate from the subclavian arteries without significant stenosis. Is no significant stenosis of either vertebral artery in the neck. Skeleton: Degenerative changes are present cervical spine with slight reversal of normal cervical lordosis. No focal lytic or blastic lesions are present. Other neck: The soft tissues the neck are unremarkable. Upper chest: Mild dependent atelectasis is present. Thoracic inlet is normal. Review of the MIP images confirms the above findings CTA HEAD FINDINGS Anterior circulation: Internal carotid arteries are within normal limits through the ICA termini  bilaterally. The A1 segments are normal bilaterally. The anterior communicating artery is patent. Right M1 segment is normal. Left M1 segment is occluded proximal to the bifurcation. Collateral vessels are evident within the sylvian fissure. Bilateral ACA and right MCA branch vessels are within normal limits. Posterior circulation: Internal carotid right vertebral artery is the dominant vessel. The non dominant left vertebral artery terminates at the PICA. Right vertebral artery becomes the basilar artery. Both posterior cerebral arteries originate from basilar tip. Prominent left posterior communicating artery is present. PCA branch vessels are within normal limits bilaterally. Venous sinuses: The dural sinuses are patent. The straight sinus deep cerebral veins are intact. Cortical veins are within normal limits. Anatomic variants: Prominent left posterior communicating artery. Review of the MIP images confirms the above findings CT Brain Perfusion Findings: ASPECTS: 10/10 CBF (<30%) Volume: 83mL Perfusion (Tmax>6.0s) volume: 82mL Mismatch Volume: 99mL Infarction Location:Left MCA IMPRESSION: 1. Large left MCA territory nonhemorrhagic infarct. 2. Acute left M1 large vessel occlusion. 3. Large penumbra with mismatch volume of 99 mL. 4. Collateral vessels are present within the sylvian fissure. 5. Atherosclerotic changes at the carotid bifurcations bilaterally without significant stenosis. 6. Aortic Atherosclerosis (ICD10-I70.0). No significant stenosis at the arch. These results were called by telephone at the time of interpretation on 03/24/2020 at 6:13 am to provider [facility] , who verbally acknowledged these results. Electronically Signed   By: Marin Roberts M.D.   On: 03/24/2020 06:26   DG Abd 1 View  Result Date: 04/15/2020 CLINICAL DATA:  Abdominal pain x1 day. EXAM: ABDOMEN - 1 VIEW COMPARISON:  Apr 10, 2020 FINDINGS: A nasogastric tube is seen with its distal tip overlying the expected  region of the duodenal bulb. The bowel gas pattern is normal. No radio-opaque calculi or other significant radiographic abnormality are seen. IMPRESSION: Nasogastric tube positioning, as described above. Electronically Signed   By: Aram Candela M.D.   On: 04/15/2020 19:33   DG Abd 1 View  Result Date: 04/10/2020 CLINICAL DATA:  History of rib fractures EXAM: ABDOMEN - 1 VIEW COMPARISON:  03/26/2020 FINDINGS: Esophageal tube tip overlies the distal stomach. Contrast material within the colon. Nonobstructed gas pattern. IMPRESSION: Esophageal tube tip overlies the distal stomach Electronically Signed   By: Jasmine Pang M.D.   On: 04/10/2020 19:10   DG Abd 1 View  Result Date: 03/26/2020 CLINICAL DATA:  Patient for NG tube placement. EXAM: ABDOMEN - 1 VIEW COMPARISON:  None. FINDINGS: Two radiographs of  the abdomen were obtained. On the second radiograph the enteric tube and side port terminate within the stomach. Lung bases are clear. Nonobstructed bowel gas pattern. On the initial radiograph the enteric tube was coiled within the esophagus. IMPRESSION: Enteric tube tip and side port project within the stomach. Electronically Signed   By: Annia Belt M.D.   On: 03/26/2020 12:57   CT HEAD WO CONTRAST  Result Date: 04/08/2020 CLINICAL DATA:  Stroke follow-up EXAM: CT HEAD WITHOUT CONTRAST TECHNIQUE: Contiguous axial images were obtained from the base of the skull through the vertex without intravenous contrast. COMPARISON:  04/07/2019 FINDINGS: Brain: Evolving large left MCA territory infarction again identified. Areas of gyriform hyperdensity again noted likely reflecting petechial hemorrhage. No new hemorrhage. Edema and mass effect remain similar with rightward midline shift again measuring 9 mm. No progression of mild medialization of the left uncus. Ventricles remain stable in size. No new loss of gray-white differentiation. Vascular: No new finding. Stent is again noted along the left M1 MCA.  Skull: Calvarium is unremarkable. Sinuses/Orbits: No acute finding. Other: None. IMPRESSION: Evolving large left MCA territory infarction with similar mass effect. No new hemorrhage or hydrocephalus. Electronically Signed   By: Guadlupe Spanish M.D.   On: 04/08/2020 08:11   CT HEAD WO CONTRAST  Result Date: 04/06/2020 CLINICAL DATA:  Stroke follow-up EXAM: CT HEAD WITHOUT CONTRAST TECHNIQUE: Contiguous axial images were obtained from the base of the skull through the vertex without intravenous contrast. COMPARISON:  04/04/2020 FINDINGS: Brain: Large left MCA territory infarct, unchanged in size. Areas of suspected petechial hemorrhage are unchanged. There remains 9 mm of rightward midline shift. No hydrocephalus. There is early downward transtentorial herniation of the left uncus, unchanged. Vascular: Stent in the left MCA, unchanged. Skull: Normal Sinuses/Orbits: Clear Other: None IMPRESSION: Unchanged size of large left MCA territory infarct with unchanged 9 mm of rightward midline shift and early/impending downward transtentorial herniation of the left uncus. Electronically Signed   By: Deatra Robinson M.D.   On: 04/06/2020 03:48   CT HEAD WO CONTRAST  Result Date: 04/04/2020 CLINICAL DATA:  Stroke post revascularization, follow-up EXAM: CT HEAD WITHOUT CONTRAST TECHNIQUE: Contiguous axial images were obtained from the base of the skull through the vertex without intravenous contrast. COMPARISON:  03/29/2020 FINDINGS: Brain: Evolving large left MCA territory infarction is again identified. There is decreased hyperdensity in the left sylvian fissure and left cortical sulci reflecting subarachnoid space blood/contrast. Areas of mild gyriform hyperdensity likely reflect petechial hemorrhage. No new hemorrhage. Edema and mass effect have increased with greater subfalcine herniation; rightward midline shift now measures 9 mm. Slightly increased size of the right temporal horn suggesting early trapping. Mild  medialization of the left uncus. Vascular: No new finding.  Left MCA stent is again noted. Skull: Calvarium is unremarkable. Sinuses/Orbits: No acute finding. Other: None. IMPRESSION: Evolving large left MCA territory infarction with probable petechial hemorrhage. Progression of edema and mass effect with midline shift now measuring 9 mm and probable early trapping of the right lateral ventricle. Electronically Signed   By: Guadlupe Spanish M.D.   On: 04/04/2020 08:02   CT HEAD WO CONTRAST  Result Date: 03/29/2020 CLINICAL DATA:  59 year old male status post code stroke presentation on 03/24/2020 with right side weakness, left MCA M1 ELV0. Status post endovascular revascularization including rescue stenting. EXAM: CT HEAD WITHOUT CONTRAST TECHNIQUE: Contiguous axial images were obtained from the base of the skull through the vertex without intravenous contrast. COMPARISON:  Head CT 03/28/2020 and earlier.  FINDINGS: Brain: CT appearance of the large left side MCA infarct with cytotoxic edema remain stable since 03/27/2020. Stable intracranial mass effect with 5 mm of rightward midline shift, partial effacement of the left lateral ventricle. Unchanged left subarachnoid space contrast/blood. No malignant hemorrhagic transformation or new intracranial blood identified. Stable brain outside of the left MCA territory. Basilar cisterns remain patent. Vascular: Left MCA stent redemonstrated. Skull: Stable and intact. Sinuses/Orbits: Visualized paranasal sinuses and mastoids are clear. Right nasoenteric tube remains in place. Other: Orbit and scalp soft tissues appear stable and negative. IMPRESSION: 1. Sequelae of large Left MCA territory infarct remain stable since 03/27/2020. Rightward midline shift remains 5 mm. 2. No new intracranial abnormality. Electronically Signed   By: Odessa Fleming M.D.   On: 03/29/2020 10:08   CT HEAD WO CONTRAST  Result Date: 03/28/2020 CLINICAL DATA:  59 year old male status post code stroke  presentation on 03/24/2020 with right side weakness, left MCA M1 ELV0. Status post endovascular revascularization including rescue stenting. Increasing intracranial mass effect yesterday. EXAM: CT HEAD WITHOUT CONTRAST TECHNIQUE: Contiguous axial images were obtained from the base of the skull through the vertex without intravenous contrast. COMPARISON:  Head CT 03/27/2020 and earlier. FINDINGS: Brain: Left MCA cytotoxic edema again noted. Stable blood and/or contrast in and around the left sylvian fissure and regional subarachnoid spaces. Intracranial mass effect with rightward midline shift is stable since yesterday, shift is 5 mm. Stable ventricle size and configuration. No ventriculomegaly. Basilar cisterns remain patent. Gray-white differentiation remains stable elsewhere. Vascular: Left MCA stent redemonstrated. Mild Calcified atherosclerosis at the skull base. Skull: Stable, negative. Sinuses/Orbits: Visualized paranasal sinuses and mastoids are stable and well pneumatized. Right nasoenteric tube remains in place. Other: Leftward gaze today. Visualized scalp soft tissues are within normal limits. IMPRESSION: 1. Stable from the CT yesterday: Large left MCA infarct with cytotoxic edema with 5 mm of rightward midline shift. Basilar cisterns remain patent. Subarachnoid blood and/or contrast in and around the left Sylvian fissure. 2. No new intracranial abnormality. Electronically Signed   By: Odessa Fleming M.D.   On: 03/28/2020 07:04   CT HEAD WO CONTRAST  Addendum Date: 03/27/2020   ADDENDUM REPORT: 03/27/2020 10:44 ADDENDUM: Study discussed by telephone with Dr. Marvel Plan on 03/27/2020 at 1039 hours. Electronically Signed   By: Odessa Fleming M.D.   On: 03/27/2020 10:44   Result Date: 03/27/2020 CLINICAL DATA:  59 year old male status post code stroke presentation yesterday with right side weakness, left MCA M1 ELV0. Status post endovascular revascularization including rescue stenting. EXAM: CT HEAD WITHOUT CONTRAST  TECHNIQUE: Contiguous axial images were obtained from the base of the skull through the vertex without intravenous contrast. COMPARISON:  Brain MRI and head CT 03/25/2020 and earlier. FINDINGS: Brain: Cytotoxic edema from large left MCA territory infarct. Increased intracranial mass effect since 03/25/2020, midline shift now 5-6 mm. Mass effect on the left lateral ventricle, but no ventriculomegaly. Continued, trapped hyperdense material in the left sylvian fissure and other nearby subarachnoid spaces-blood versus contrast. No intraventricular hemorrhage. There is a trace amount of blood layering along the midline tentorium on series 3, image 15. No new intracranial blood. Gray-white matter differentiation elsewhere remains normal. Basilar cisterns remain patent. Vascular: Stable left MCA stent. Skull: Stable, intact. Sinuses/Orbits: Visualized paranasal sinuses and mastoids are stable and well pneumatized. Right nasoenteric tube in place. Other: No acute orbit or scalp soft tissue findings. IMPRESSION: 1. Continued evolution of large Left MCA infarct with increased intracranial mass effect since 03/26/2019. Rightward midline  shift now 5-6 mm. Basilar cisterns remain patent. 2. Stable left hemisphere subarachnoid hemorrhage and/or contrast, including trace blood layering along the midline tentorium. 3. No new intracranial abnormality. Electronically Signed: By: Odessa Fleming M.D. On: 03/27/2020 10:31   CT HEAD WO CONTRAST  Result Date: 03/25/2020 CLINICAL DATA:  59 year old male status post code stroke presentation yesterday with right side weakness, left MCA M1 ELV0. Status post endovascular revascularization including rescue stenting. Postprocedure subarachnoid blood versus contrast. EXAM: CT HEAD WITHOUT CONTRAST TECHNIQUE: Contiguous axial images were obtained from the base of the skull through the vertex without intravenous contrast. COMPARISON:  Postprocedure head CT 03/24/2020 and earlier. FINDINGS: Brain:  Cytotoxic edema in the left hemisphere resembling the T-max greater than 6 volume on CTP yesterday (series 3, image 16). Expected evolution since 1830 hours yesterday. But only mild intracranial mass effect at this time with trace rightward midline shift. Persistent high density in the left subarachnoid space around the sylvian fissure more resembles contrast than hemorrhage. And configuration is unchanged since yesterday. No intraventricular hemorrhage. No ventriculomegaly. Gray-white matter differentiation remains normal in the right hemisphere and posterior fossa. Basilar cisterns are patent. Vascular: Left MCA M1 through bifurcation stent redemonstrated. Skull: No acute osseous abnormality identified. Sinuses/Orbits: Visualized paranasal sinuses and mastoids are stable and well pneumatized. Other: Fluid in the nasopharynx, intubated on the scalp view. Visualized orbits and scalp soft tissues are within normal limits. IMPRESSION: 1. Evolving large left MCA infarct with expected appearance compared to 1830 hours yesterday. 2. No definite hemorrhagic conversion, and only mild intracranial mass effect at this time - trace midline shift. 3. Persistent high density in the subarachnoid spaces in and around the left Sylvian fissure more resembles contrast than blood. 4. No new intracranial abnormality. Electronically Signed   By: Odessa Fleming M.D.   On: 03/25/2020 14:41   CT HEAD WO CONTRAST  Result Date: 03/24/2020 CLINICAL DATA:  Follow-up acute left cerebral hemisphere infarct with intervention. EXAM: CT HEAD WITHOUT CONTRAST TECHNIQUE: Contiguous axial images were obtained from the base of the skull through the vertex without intravenous contrast. COMPARISON:  Earlier today. FINDINGS: Brain: Again demonstrated is ill-defined low density and subarachnoid hemorrhage in the left middle cerebral and left posterior cerebral artery distributions. A small, old curvilinear infarct along the lateral margin of the left caudate  head is again noted. Normal size and position of the ventricles. No shift of midline structures. Vascular: Left middle cerebral artery stent. Skull: Normal. Negative for fracture or focal lesion. Sinuses/Orbits: Unremarkable. Other: None. IMPRESSION: 1. Acute left middle cerebral and left posterior cerebral artery territory infarct without mass effect. 2. Stable subarachnoid hemorrhage on the left. 3. Stable left middle cerebral artery stent. 4. Stable small, old curvilinear infarct along the lateral margin of the left caudate head. Electronically Signed   By: Beckie Salts M.D.   On: 03/24/2020 18:45   CT Angio Neck W and/or Wo Contrast  Result Date: 03/24/2020 CLINICAL DATA:  Patient found down at 5 a.m. Right-sided weakness. Abnormal speech. Facial droop. EXAM: CT ANGIOGRAPHY HEAD AND NECK CT PERFUSION BRAIN TECHNIQUE: Multidetector CT imaging of the head and neck was performed using the standard protocol during bolus administration of intravenous contrast. Multiplanar CT image reconstructions and MIPs were obtained to evaluate the vascular anatomy. Carotid stenosis measurements (when applicable) are obtained utilizing NASCET criteria, using the distal internal carotid diameter as the denominator. Multiphase CT imaging of the brain was performed following IV bolus contrast injection. Subsequent parametric perfusion maps were  calculated using RAPID software. CONTRAST:  OMNIPAQUE IOHEXOL 350 MG/ML SOLN COMPARISON:  CT head without contrast 03/24/2020 FINDINGS: CTA NECK FINDINGS Aortic arch: A 3 vessel arch configuration is present. Atherosclerotic calcifications are present at the aortic arch. No aneurysm or significant stenosis is present. Right carotid system: Right common carotid artery is within normal limits. Atherosclerotic changes are present at the bifurcation without significant stenosis. Study is mildly degraded by dental artifact. Left carotid system: The left common carotid artery is within  normal limits. Atherosclerotic changes are present at the proximal left ICA without a significant stenosis relative to the more distal vessel. Vertebral arteries: The right vertebral artery is the dominant vessel. Both vertebral arteries originate from the subclavian arteries without significant stenosis. Is no significant stenosis of either vertebral artery in the neck. Skeleton: Degenerative changes are present cervical spine with slight reversal of normal cervical lordosis. No focal lytic or blastic lesions are present. Other neck: The soft tissues the neck are unremarkable. Upper chest: Mild dependent atelectasis is present. Thoracic inlet is normal. Review of the MIP images confirms the above findings CTA HEAD FINDINGS Anterior circulation: Internal carotid arteries are within normal limits through the ICA termini bilaterally. The A1 segments are normal bilaterally. The anterior communicating artery is patent. Right M1 segment is normal. Left M1 segment is occluded proximal to the bifurcation. Collateral vessels are evident within the sylvian fissure. Bilateral ACA and right MCA branch vessels are within normal limits. Posterior circulation: Internal carotid right vertebral artery is the dominant vessel. The non dominant left vertebral artery terminates at the PICA. Right vertebral artery becomes the basilar artery. Both posterior cerebral arteries originate from basilar tip. Prominent left posterior communicating artery is present. PCA branch vessels are within normal limits bilaterally. Venous sinuses: The dural sinuses are patent. The straight sinus deep cerebral veins are intact. Cortical veins are within normal limits. Anatomic variants: Prominent left posterior communicating artery. Review of the MIP images confirms the above findings CT Brain Perfusion Findings: ASPECTS: 10/10 CBF (<30%) Volume: 79mL Perfusion (Tmax>6.0s) volume: 88mL Mismatch Volume: 47mL Infarction Location:Left MCA IMPRESSION: 1.  Large left MCA territory nonhemorrhagic infarct. 2. Acute left M1 large vessel occlusion. 3. Large penumbra with mismatch volume of 99 mL. 4. Collateral vessels are present within the sylvian fissure. 5. Atherosclerotic changes at the carotid bifurcations bilaterally without significant stenosis. 6. Aortic Atherosclerosis (ICD10-I70.0). No significant stenosis at the arch. These results were called by telephone at the time of interpretation on 03/24/2020 at 6:13 am to provider [facility] , who verbally acknowledged these results. Electronically Signed   By: Marin Roberts M.D.   On: 03/24/2020 06:26   MR ANGIO HEAD WO CONTRAST  Result Date: 03/26/2020 CLINICAL DATA:  Follow-up examination for acute stroke. Previously identified left M1 occlusion, status post tPA and catheter directed revascularization with stent placement. EXAM: MRI HEAD WITHOUT CONTRAST MRA HEAD WITHOUT CONTRAST TECHNIQUE: Multiplanar, multiecho pulse sequences of the brain and surrounding structures were obtained without intravenous contrast. Angiographic images of the head were obtained using MRA technique without contrast. COMPARISON:  Prior head CT from earlier the same day as well as previous exams. FINDINGS: MRI HEAD FINDINGS Brain: Cerebral volume within normal limits for age. Minimal chronic microvascular ischemic disease noted within the supratentorial cerebral white matter. Large confluent area of restricted diffusion involving essentially the entirety of the left MCA distribution seen involving the left cerebral hemisphere. Associated gyral swelling and edema throughout the area of infarction. Associated regional mass effect  with early left-to-right midline shift measuring up to 3 mm. Scattered T1 hyperintensity with extensive susceptibility artifact throughout the left sylvian fissure felt to be most consistent with subarachnoid hemorrhage. Additional scattered foci of subarachnoid blood seen scattered within the  cortical sulci through the area of infarction, although some of this susceptibility signal likely reflects engorgement of the cortical veins. Small amount of subarachnoid blood also seen along the suprasellar cistern and likely within the interpeduncular cistern. No frank hemorrhagic transformation or intraparenchymal hemorrhage. No other areas of acute or subacute infarct. Gray-white matter differentiation otherwise maintained. No other areas of chronic cortical infarction. No mass lesion or extra-axial fluid collection. No hydrocephalus or ventricular trapping. Vascular: Vascular stent in place within the left M1 segment. Major intracranial vascular flow voids otherwise maintained. Skull and upper cervical spine: Craniocervical junction within normal limits. Bone marrow signal intensity normal. No scalp soft tissue abnormality. Sinuses/Orbits: Globes and orbital soft tissues within normal limits. Mild scattered mucosal thickening noted within the ethmoidal air cells. Paranasal sinuses are otherwise clear. Fluid seen layering within the nasopharynx with trace bilateral mastoid effusions. Patient is intubated. Other: None. MRA HEAD FINDINGS ANTERIOR CIRCULATION: Visualized distal cervical segments of the internal carotid arteries are patent with fairly symmetric antegrade flow. Petrous segments widely patent bilaterally. Cavernous and supraclinoid right ICA are widely patent. On the left, there is a focal 2-3 mm filling defect involving the proximal cavernous left ICA, suspicious for possible small amount of intraluminal thrombus (series 3, image 50). This is not seen on prior CTA. Associated moderate stenosis at this level. Cavernous and supraclinoid left ICA otherwise widely patent. ICA termini well perfused. A1 segments patent bilaterally. Normal anterior communicating artery. Anterior cerebral arteries are widely patent to their distal aspects. Right M1 widely patent. Normal right MCA bifurcation. Distal right  MCA branches well perfused. On the left, vascular stent in place within the left M1 segment, extending across the left MCA bifurcation into proximal left M2 branch. No discernible flow seen through the stent. No flow related signal seen distally within the left MCA branches, suggesting reocclusion. POSTERIOR CIRCULATION: Partially visualized right vertebral artery remains patent. 2 mm focal outpouching extending posteriorly from the vertebrobasilar junction noted, possibly a small aneurysm versus vascular infundibulum, stable (series 3, image 23). Hypoplastic left vertebral artery terminates in PICA, not well seen on this exam. Partially visualized picas appear patent. Basilar remains patent to its distal aspect without stenosis. Superior cerebral arteries patent bilaterally. Both PCAs primarily supplied via the basilar. PCAs remain well perfused to their distal aspects without stenosis. IMPRESSION: MRI HEAD IMPRESSION: 1. Continued interval evolution of large left MCA territory infarct. Associated cytotoxic edema with regional mass effect and 3 mm of left-to-right shift. 2. Scattered susceptibility artifact throughout the left sylvian fissure and left cerebral cortical sulci, most consistent with subarachnoid hemorrhage. Additional small volume subarachnoid blood also seen within the basilar cisterns. No frank intraparenchymal hemorrhage or hemorrhagic transformation. MRA HEAD IMPRESSION: 1. Vascular stent in place within the left M1 segment, with no discernible flow through the stent or distally within the left MCA distribution, consistent with reocclusion. 2. Focal 2-3 mm filling defect within the proximal cavernous left ICA as above, suspicious for a small amount of intraluminal thrombus. This is new from previous. 3. 2 mm focal outpouching extending posteriorly from the vertebrobasilar junction, which could reflect a small vascular infundibulum versus aneurysm. Electronically Signed   By: Rise MuBenjamin  McClintock  M.D.   On: 03/26/2020 00:46   MR BRAIN WO  CONTRAST  Result Date: 03/26/2020 CLINICAL DATA:  Follow-up examination for acute stroke. Previously identified left M1 occlusion, status post tPA and catheter directed revascularization with stent placement. EXAM: MRI HEAD WITHOUT CONTRAST MRA HEAD WITHOUT CONTRAST TECHNIQUE: Multiplanar, multiecho pulse sequences of the brain and surrounding structures were obtained without intravenous contrast. Angiographic images of the head were obtained using MRA technique without contrast. COMPARISON:  Prior head CT from earlier the same day as well as previous exams. FINDINGS: MRI HEAD FINDINGS Brain: Cerebral volume within normal limits for age. Minimal chronic microvascular ischemic disease noted within the supratentorial cerebral white matter. Large confluent area of restricted diffusion involving essentially the entirety of the left MCA distribution seen involving the left cerebral hemisphere. Associated gyral swelling and edema throughout the area of infarction. Associated regional mass effect with early left-to-right midline shift measuring up to 3 mm. Scattered T1 hyperintensity with extensive susceptibility artifact throughout the left sylvian fissure felt to be most consistent with subarachnoid hemorrhage. Additional scattered foci of subarachnoid blood seen scattered within the cortical sulci through the area of infarction, although some of this susceptibility signal likely reflects engorgement of the cortical veins. Small amount of subarachnoid blood also seen along the suprasellar cistern and likely within the interpeduncular cistern. No frank hemorrhagic transformation or intraparenchymal hemorrhage. No other areas of acute or subacute infarct. Gray-white matter differentiation otherwise maintained. No other areas of chronic cortical infarction. No mass lesion or extra-axial fluid collection. No hydrocephalus or ventricular trapping. Vascular: Vascular stent in place  within the left M1 segment. Major intracranial vascular flow voids otherwise maintained. Skull and upper cervical spine: Craniocervical junction within normal limits. Bone marrow signal intensity normal. No scalp soft tissue abnormality. Sinuses/Orbits: Globes and orbital soft tissues within normal limits. Mild scattered mucosal thickening noted within the ethmoidal air cells. Paranasal sinuses are otherwise clear. Fluid seen layering within the nasopharynx with trace bilateral mastoid effusions. Patient is intubated. Other: None. MRA HEAD FINDINGS ANTERIOR CIRCULATION: Visualized distal cervical segments of the internal carotid arteries are patent with fairly symmetric antegrade flow. Petrous segments widely patent bilaterally. Cavernous and supraclinoid right ICA are widely patent. On the left, there is a focal 2-3 mm filling defect involving the proximal cavernous left ICA, suspicious for possible small amount of intraluminal thrombus (series 3, image 50). This is not seen on prior CTA. Associated moderate stenosis at this level. Cavernous and supraclinoid left ICA otherwise widely patent. ICA termini well perfused. A1 segments patent bilaterally. Normal anterior communicating artery. Anterior cerebral arteries are widely patent to their distal aspects. Right M1 widely patent. Normal right MCA bifurcation. Distal right MCA branches well perfused. On the left, vascular stent in place within the left M1 segment, extending across the left MCA bifurcation into proximal left M2 branch. No discernible flow seen through the stent. No flow related signal seen distally within the left MCA branches, suggesting reocclusion. POSTERIOR CIRCULATION: Partially visualized right vertebral artery remains patent. 2 mm focal outpouching extending posteriorly from the vertebrobasilar junction noted, possibly a small aneurysm versus vascular infundibulum, stable (series 3, image 23). Hypoplastic left vertebral artery terminates in  PICA, not well seen on this exam. Partially visualized picas appear patent. Basilar remains patent to its distal aspect without stenosis. Superior cerebral arteries patent bilaterally. Both PCAs primarily supplied via the basilar. PCAs remain well perfused to their distal aspects without stenosis. IMPRESSION: MRI HEAD IMPRESSION: 1. Continued interval evolution of large left MCA territory infarct. Associated cytotoxic edema with regional mass effect and  3 mm of left-to-right shift. 2. Scattered susceptibility artifact throughout the left sylvian fissure and left cerebral cortical sulci, most consistent with subarachnoid hemorrhage. Additional small volume subarachnoid blood also seen within the basilar cisterns. No frank intraparenchymal hemorrhage or hemorrhagic transformation. MRA HEAD IMPRESSION: 1. Vascular stent in place within the left M1 segment, with no discernible flow through the stent or distally within the left MCA distribution, consistent with reocclusion. 2. Focal 2-3 mm filling defect within the proximal cavernous left ICA as above, suspicious for a small amount of intraluminal thrombus. This is new from previous. 3. 2 mm focal outpouching extending posteriorly from the vertebrobasilar junction, which could reflect a small vascular infundibulum versus aneurysm. Electronically Signed   By: Rise Mu M.D.   On: 03/26/2020 00:46   IR Intra Cran Stent  Result Date: 03/26/2020 INDICATION: New onset right-sided facial droop and right-sided weakness with aphasia. Occluded left middle cerebral artery M1 segment on CT angiogram of the head and neck. EXAM: 1. EMERGENT LARGE VESSEL OCCLUSION THROMBOLYSIS (anterior CIRCULATION) COMPARISON:  CT angiogram of the head and neck of March 24, 2020. MEDICATIONS: Ancef 2 g IV antibiotic was administered within 1 hour of the procedure. ANESTHESIA/SEDATION: General anesthesia. CONTRAST:  Isovue 300 approximately 150 cc. FLUOROSCOPY TIME:  Fluoroscopy Time:  82 minutes 0 seconds (3115 mGy). COMPLICATIONS: None immediate. TECHNIQUE: Following a full explanation of the procedure along with the potential associated complications, an informed witnessed consent was obtained. The risks of intracranial hemorrhage of 10%, worsening neurological deficit, ventilator dependency, death and inability to revascularize were all reviewed in detail with the patient's daughter. The patient was then put under general anesthesia by the Department of Anesthesiology at [facility]. The right groin was prepped and draped in the usual sterile fashion. Thereafter using modified Seldinger technique, transfemoral access into the right common femoral artery was obtained without difficulty. Over a 0.035 inch guidewire a 8 French 25 cm Pinnacle sheath was inserted. Through this, and also over a 0.035 inch guidewire an 087 balloon guide catheter with a 5 [place] Berenstein select combination was advanced to the aortic arch region and selectively positioned in the distal left common carotid artery. The guidewire was removed. Good aspiration was obtained from the hub of the balloon guide catheter following withdrawal of the select 5 French catheter. FINDINGS: A control arteriogram performed through the balloon guide catheter centered extra cranially and intracranially demonstrated the left external carotid artery and its major branches to be widely patent. The left internal carotid artery at the bulb to the cranial skull base demonstrates wide patency. The petrous, the cavernous and the supraclinoid segments are widely patent. The left posterior communicating artery is seen opacifying the left posterior cerebral distribution. Angiographic occlusion with moderate tapered narrowing noted of the left middle cerebral artery mid M1 segment. Mild narrowing of the left A1 segment of anterior cerebral artery is noted. More distally the left anterior cerebral artery opacifies into the capillary and  venous phases. The delayed arterial phase demonstrates mild collateralization of the left MCA distribution from the pericallosal artery branches, and the P3 branches of the left posterior cerebral artery. PROCEDURE: A combination of large-bore 130 cm 6 [place] support catheter with 021 Trevo ProVue microcatheter was advanced over a 0.014 inch standard Synchro micro guidewire with a J configuration through the balloon guide catheter to the horizontal petrous segment of the left middle cerebral artery. At this time the balloon guide catheter was advanced to the mid 1/3 of the cervical  left ICA. With the micro guidewire leading, and using a torque device, access into the inferior division of the left middle cerebral artery was obtained with the micro guidewire followed by the microcatheter into the M2 M3 region. The guidewire was removed. Good aspiration obtained from the hub of the microcatheter. A gentle control arteriogram performed through the microcatheter demonstrated safe positioning of the tip of the microcatheter. This was then connected to continuous heparinized saline infusion. A Solitaire 4 mm x 40 mm X retrieval device was then advanced to the distal end of the microcatheter. The O ring on the delivery microcatheter was then loosened. With slight forward gentle traction with the right hand on the delivery micro guidewire with the left hand the delivery microcatheter was retrieved unsheathing the distal and the proximal retrieval device. The large-bore catheter was advanced to the just prior proximal aspect of the inferior division. A control arteriogram performed through the large-bore catheter in the left internal carotid artery demonstrated revascularization in the left MCA distribution with the area of stenosis noted in the mid M1 segment, and also in the proximal inferior division of the left middle cerebral artery M3 region. Thereafter with proximal flow arrest in the left internal carotid artery with  balloon dilatation, and constant aspiration with a 60 mL syringe at the hub of the balloon guide catheter, and a Penumbra aspiration device through the large-bore catheter over 2-1/2 minutes, the combination of the retrieval device, the micro catheter and the large-bore catheter were retrieved and removed as constant aspiration was applied at the hub of the balloon guide catheter. Following reversal of flow arrest, a control arteriogram performed through the balloon guide catheter in the left internal carotid artery demonstrated no relief of the obstructed left middle cerebral artery. A second pass was then made with the combination described above. At this time, following ascertained safe positioning of the tip of the microcatheter in the dominant inferior division M3 region, a 6 mm x 40 mm Solitaire X retrieval device was deployed. Again with proximal flow arrest, in the left internal carotid artery, and with constant aspiration at the hub of the large-bore catheter with Penumbra aspiration device, and a 60 mL syringe at the hub of the balloon guide catheter over approximately 2 minutes, the combination was again retrieved and removed. Following reversal of flow arrest, a control arteriogram performed through the balloon guide demonstrated now revascularization of the left middle cerebral artery proximally, and the inferior division. Tandem stenosis was noted at the mid left M1 segment, and also the inferior division proximally most consistent with arteriosclerotic disease. A repeat arteriogram performed approximately 3 minutes after revascularization demonstrated progressive reocclusion of the left middle cerebral artery. A third pass was then made again using the above combination. Again after having accessed the reoccluded left middle cerebral artery in the M3 inferior division and ascertaining safest position of the tip of the microcatheter, a 5 mm x 33 mm Embotrap retrieval device was then advanced and  deployed as mentioned previously. With constant aspiration being applied at the hub of the large-bore catheter just inside the inferior division for 2-1/2 minutes and proximal flow arrest, and aspiration at the hub of the balloon guide catheter, the combination of the retrieval device, and the microcatheter and the large-bore catheter were removed. Following reversal of flow arrest a control arteriogram performed through the left internal carotid artery again demonstrated only marginally improved flow through the left middle cerebral artery M1 segment with pitiful flow into the inferior  division. A fourth pass made again using the above combination with a new microcatheter, and a new large-bore intermediary catheter was advanced over a 0.014 inch standard Synchro micro guidewire in the inferior division M3 region. The 5 mm x 33 mm Embotrap retrieval device was again deployed as mentioned previously. A control arteriogram performed following deployment of the retrieval device again demonstrated improved caliber and revascularization of the left MCA distribution achieving a TICI 2B revascularization. With proximal flow arrest and constant aspiration at the hub of the 6 French large-bore catheter with the Penumbra aspiration device and constant aspiration at the hub of balloon guide catheter with a 60 mL syringe over 2-1/2 minutes, the combination was retrieved and removed. Following reversal of the flow arrest, a control arteriogram performed through the balloon guide catheter in the left internal carotid artery demonstrated improved flow through the left M1 segment with modestly improved inferior division. There continued to be complete occlusion of the proximal inferior division. A fifth pass was then made using the combination of a 6 [place] Catalyst intermediary catheter with a combination of a Trevo ProVue microcatheter again positioned in the M3 inferior division. Again after having ascertained safe position of  the tip of the microcatheter, a 6 mm x 40 mm Solitaire X retrieval device was advanced and deployed. Again with constant aspiration at the hub of the 6 [place] Catalyst guide catheter now advanced just inside the inferior division, and a 60 mL syringe at the hub of the balloon guide catheter with proximal flow arrest, the combination was retrieved after approximately 2-1/2 minutes. Following removal of the combination and reversal of the flow arrest, a control arteriogram performed through the balloon guide catheter demonstrated modestly improved flow through the left MCA distribution with a TICI 2B revascularization of which continued to slowly occlude over the next few minutes. Three of the five aspiration attempts, only a few specks of a clot aperture was attained in the interstices of the retrieval device, and also the aspirate. It was felt that the recurring occlusion was secondary to the significant arteriosclerotic narrowing in the left M1 segment mid to distal third. At this time, an orogastric tube was inserted. A CT scan of the brain was obtained to evaluate for major intracranial bleed. This demonstrated mild subarachnoid hyperdensity in the left peri-insular and the left anterior parietal regions without evidence mass effect or midline shift. No intraparenchymal hemorrhage was seen. Following discussion with the referring neuro hospitalist, it was elected to proceed with placement of a rescue stent. The patient was given 325 mg of aspirin via the orogastric tube. Patient also given proximally 7.5 mg of Integrilin intra-arterially. Through the balloon guide catheter in the left internal carotid artery a combination of the 6 [place] Catalyst guide catheter with an 021 Trevo ProVue microcatheter was advanced over a 0.014 inch standard Synchro micro to the M3 region of the inferior division followed by the microcatheter. Again the guidewire was removed. Good aspiration obtained from the hub of the  microcatheter which was then connected to continuous heparinized saline infusion. Measurements were made of the left middle cerebral artery M1 segment and also the inferior division from earlier angiographic runs. It was decided to use a 4 mm x 24 mm Neuroform Atlas stent. This was then advanced in a coaxial manner and with constant heparinized saline infusion to the distal end of the microcatheter. The proximal and the distal landing zones were then defined. The O ring on the delivery microcatheter was loosened. The  stent was then deployed without difficulty in the usual manner. The delivery micro guidewire was then retrieved and removed. The control arteriogram performed through the balloon guide catheter demonstrated excellent revascularization of the left MCA distribution proximally and in the trifurcation regions achieving a TICI 2C revascularization. There continued to be moderate stenosis at the M1 M2 junction on the AP projection but widely patent on the lateral projection. Also there was a moderate arteriosclerotic stenosis in the distal M3 segment. No evidence of extravasation, mass-effect or midline shift was noted. A repeat arteriogram performed 10 minutes later continued to demonstrate patency of the left MCA stented segment, and also of the left middle cerebral artery. The left posterior communicating artery remained widely patent. The 6 [place] Catalyst guide catheter was retrieved and removed. A final control arteriogram performed through the balloon guide catheter in the left common carotid artery demonstrates patency of the left internal carotid artery extra cranially and intracranially with patency of the left MCA and the left anterior cerebral distributions. The balloon guide catheter was retrieved and removed. The 8 French Pinnacle sheath in the right groin was then removed with the successful placement of an 8 French Angio-Seal closure device with hemostasis. The right groin appeared soft. The  left groin 4 French arterial sheath was left in place for close monitoring of the arterial pressures. Dopplerable in the dorsalis pedis, the posterior tibial regions bilaterally unchanged. A second CT angiogram performed demonstrated slightly increased subarachnoid hyperdensity in the left peri-insular and the left anterior parietal regions probably representing a mixture of contrast, and blood. However, no mass-effect or midline shift or intraparenchymal hemorrhage was noted. The patient was given 60 mL of Brilinta via an orogastric tube in addition to the 325 mg of aspirin earlier. The patient was left intubated as per anesthesia to protect the airway, and also because of severe aphasia. The patient was then transferred to the ICU for post thrombectomy management. IMPRESSION: Status post endovascular revascularization of occluded left middle cerebral M1 segment due to underlying severe intracranial arteriosclerosis, with 1 pass with the 4 mm x 40 mm Solitaire X retrieval device, 2 passes with the 6 mm x 40 mm Embotrap retrieval device, and placement of a rescue stent in the left middle cerebral artery for persistent reocclusion, achieving a TICI 2C revascularization. PLAN: Follow-up in the clinic 4 weeks post discharge. Electronically Signed   By: Julieanne Cotton M.D.   On: 03/25/2020 10:20   IR CT [facility]  Result Date: 03/26/2020 INDICATION: New onset right-sided facial droop and right-sided weakness with aphasia. Occluded left middle cerebral artery M1 segment on CT angiogram of the head and neck. EXAM: 1. EMERGENT LARGE VESSEL OCCLUSION THROMBOLYSIS (anterior CIRCULATION) COMPARISON:  CT angiogram of the head and neck of March 24, 2020. MEDICATIONS: Ancef 2 g IV antibiotic was administered within 1 hour of the procedure. ANESTHESIA/SEDATION: General anesthesia. CONTRAST:  Isovue 300 approximately 150 cc. FLUOROSCOPY TIME:  Fluoroscopy Time: 82 minutes 0 seconds (3115 mGy). COMPLICATIONS: None immediate.  TECHNIQUE: Following a full explanation of the procedure along with the potential associated complications, an informed witnessed consent was obtained. The risks of intracranial hemorrhage of 10%, worsening neurological deficit, ventilator dependency, death and inability to revascularize were all reviewed in detail with the patient's daughter. The patient was then put under general anesthesia by the Department of Anesthesiology at [facility]. The right groin was prepped and draped in the usual sterile fashion. Thereafter using modified Seldinger technique, transfemoral access into the  right common femoral artery was obtained without difficulty. Over a 0.035 inch guidewire a 8 French 25 cm Pinnacle sheath was inserted. Through this, and also over a 0.035 inch guidewire an 087 balloon guide catheter with a 5 [place] Berenstein select combination was advanced to the aortic arch region and selectively positioned in the distal left common carotid artery. The guidewire was removed. Good aspiration was obtained from the hub of the balloon guide catheter following withdrawal of the select 5 French catheter. FINDINGS: A control arteriogram performed through the balloon guide catheter centered extra cranially and intracranially demonstrated the left external carotid artery and its major branches to be widely patent. The left internal carotid artery at the bulb to the cranial skull base demonstrates wide patency. The petrous, the cavernous and the supraclinoid segments are widely patent. The left posterior communicating artery is seen opacifying the left posterior cerebral distribution. Angiographic occlusion with moderate tapered narrowing noted of the left middle cerebral artery mid M1 segment. Mild narrowing of the left A1 segment of anterior cerebral artery is noted. More distally the left anterior cerebral artery opacifies into the capillary and venous phases. The delayed arterial phase demonstrates mild  collateralization of the left MCA distribution from the pericallosal artery branches, and the P3 branches of the left posterior cerebral artery. PROCEDURE: A combination of large-bore 130 cm 6 [place] support catheter with 021 Trevo ProVue microcatheter was advanced over a 0.014 inch standard Synchro micro guidewire with a J configuration through the balloon guide catheter to the horizontal petrous segment of the left middle cerebral artery. At this time the balloon guide catheter was advanced to the mid 1/3 of the cervical left ICA. With the micro guidewire leading, and using a torque device, access into the inferior division of the left middle cerebral artery was obtained with the micro guidewire followed by the microcatheter into the M2 M3 region. The guidewire was removed. Good aspiration obtained from the hub of the microcatheter. A gentle control arteriogram performed through the microcatheter demonstrated safe positioning of the tip of the microcatheter. This was then connected to continuous heparinized saline infusion. A Solitaire 4 mm x 40 mm X retrieval device was then advanced to the distal end of the microcatheter. The O ring on the delivery microcatheter was then loosened. With slight forward gentle traction with the right hand on the delivery micro guidewire with the left hand the delivery microcatheter was retrieved unsheathing the distal and the proximal retrieval device. The large-bore catheter was advanced to the just prior proximal aspect of the inferior division. A control arteriogram performed through the large-bore catheter in the left internal carotid artery demonstrated revascularization in the left MCA distribution with the area of stenosis noted in the mid M1 segment, and also in the proximal inferior division of the left middle cerebral artery M3 region. Thereafter with proximal flow arrest in the left internal carotid artery with balloon dilatation, and constant aspiration with a 60 mL  syringe at the hub of the balloon guide catheter, and a Penumbra aspiration device through the large-bore catheter over 2-1/2 minutes, the combination of the retrieval device, the micro catheter and the large-bore catheter were retrieved and removed as constant aspiration was applied at the hub of the balloon guide catheter. Following reversal of flow arrest, a control arteriogram performed through the balloon guide catheter in the left internal carotid artery demonstrated no relief of the obstructed left middle cerebral artery. A second pass was then made with the combination described above.  At this time, following ascertained safe positioning of the tip of the microcatheter in the dominant inferior division M3 region, a 6 mm x 40 mm Solitaire X retrieval device was deployed. Again with proximal flow arrest, in the left internal carotid artery, and with constant aspiration at the hub of the large-bore catheter with Penumbra aspiration device, and a 60 mL syringe at the hub of the balloon guide catheter over approximately 2 minutes, the combination was again retrieved and removed. Following reversal of flow arrest, a control arteriogram performed through the balloon guide demonstrated now revascularization of the left middle cerebral artery proximally, and the inferior division. Tandem stenosis was noted at the mid left M1 segment, and also the inferior division proximally most consistent with arteriosclerotic disease. A repeat arteriogram performed approximately 3 minutes after revascularization demonstrated progressive reocclusion of the left middle cerebral artery. A third pass was then made again using the above combination. Again after having accessed the reoccluded left middle cerebral artery in the M3 inferior division and ascertaining safest position of the tip of the microcatheter, a 5 mm x 33 mm Embotrap retrieval device was then advanced and deployed as mentioned previously. With constant aspiration  being applied at the hub of the large-bore catheter just inside the inferior division for 2-1/2 minutes and proximal flow arrest, and aspiration at the hub of the balloon guide catheter, the combination of the retrieval device, and the microcatheter and the large-bore catheter were removed. Following reversal of flow arrest a control arteriogram performed through the left internal carotid artery again demonstrated only marginally improved flow through the left middle cerebral artery M1 segment with pitiful flow into the inferior division. A fourth pass made again using the above combination with a new microcatheter, and a new large-bore intermediary catheter was advanced over a 0.014 inch standard Synchro micro guidewire in the inferior division M3 region. The 5 mm x 33 mm Embotrap retrieval device was again deployed as mentioned previously. A control arteriogram performed following deployment of the retrieval device again demonstrated improved caliber and revascularization of the left MCA distribution achieving a TICI 2B revascularization. With proximal flow arrest and constant aspiration at the hub of the 6 French large-bore catheter with the Penumbra aspiration device and constant aspiration at the hub of balloon guide catheter with a 60 mL syringe over 2-1/2 minutes, the combination was retrieved and removed. Following reversal of the flow arrest, a control arteriogram performed through the balloon guide catheter in the left internal carotid artery demonstrated improved flow through the left M1 segment with modestly improved inferior division. There continued to be complete occlusion of the proximal inferior division. A fifth pass was then made using the combination of a 6 [place] Catalyst intermediary catheter with a combination of a Trevo ProVue microcatheter again positioned in the M3 inferior division. Again after having ascertained safe position of the tip of the microcatheter, a 6 mm x 40 mm Solitaire X  retrieval device was advanced and deployed. Again with constant aspiration at the hub of the 6 [place] Catalyst guide catheter now advanced just inside the inferior division, and a 60 mL syringe at the hub of the balloon guide catheter with proximal flow arrest, the combination was retrieved after approximately 2-1/2 minutes. Following removal of the combination and reversal of the flow arrest, a control arteriogram performed through the balloon guide catheter demonstrated modestly improved flow through the left MCA distribution with a TICI 2B revascularization of which continued to slowly occlude over the next  few minutes. Three of the five aspiration attempts, only a few specks of a clot aperture was attained in the interstices of the retrieval device, and also the aspirate. It was felt that the recurring occlusion was secondary to the significant arteriosclerotic narrowing in the left M1 segment mid to distal third. At this time, an orogastric tube was inserted. A CT scan of the brain was obtained to evaluate for major intracranial bleed. This demonstrated mild subarachnoid hyperdensity in the left peri-insular and the left anterior parietal regions without evidence mass effect or midline shift. No intraparenchymal hemorrhage was seen. Following discussion with the referring neuro hospitalist, it was elected to proceed with placement of a rescue stent. The patient was given 325 mg of aspirin via the orogastric tube. Patient also given proximally 7.5 mg of Integrilin intra-arterially. Through the balloon guide catheter in the left internal carotid artery a combination of the 6 [place] Catalyst guide catheter with an 021 Trevo ProVue microcatheter was advanced over a 0.014 inch standard Synchro micro to the M3 region of the inferior division followed by the microcatheter. Again the guidewire was removed. Good aspiration obtained from the hub of the microcatheter which was then connected to continuous heparinized  saline infusion. Measurements were made of the left middle cerebral artery M1 segment and also the inferior division from earlier angiographic runs. It was decided to use a 4 mm x 24 mm Neuroform Atlas stent. This was then advanced in a coaxial manner and with constant heparinized saline infusion to the distal end of the microcatheter. The proximal and the distal landing zones were then defined. The O ring on the delivery microcatheter was loosened. The stent was then deployed without difficulty in the usual manner. The delivery micro guidewire was then retrieved and removed. The control arteriogram performed through the balloon guide catheter demonstrated excellent revascularization of the left MCA distribution proximally and in the trifurcation regions achieving a TICI 2C revascularization. There continued to be moderate stenosis at the M1 M2 junction on the AP projection but widely patent on the lateral projection. Also there was a moderate arteriosclerotic stenosis in the distal M3 segment. No evidence of extravasation, mass-effect or midline shift was noted. A repeat arteriogram performed 10 minutes later continued to demonstrate patency of the left MCA stented segment, and also of the left middle cerebral artery. The left posterior communicating artery remained widely patent. The 6 [place] Catalyst guide catheter was retrieved and removed. A final control arteriogram performed through the balloon guide catheter in the left common carotid artery demonstrates patency of the left internal carotid artery extra cranially and intracranially with patency of the left MCA and the left anterior cerebral distributions. The balloon guide catheter was retrieved and removed. The 8 French Pinnacle sheath in the right groin was then removed with the successful placement of an 8 French Angio-Seal closure device with hemostasis. The right groin appeared soft. The left groin 4 French arterial sheath was left in place for close  monitoring of the arterial pressures. Dopplerable in the dorsalis pedis, the posterior tibial regions bilaterally unchanged. A second CT angiogram performed demonstrated slightly increased subarachnoid hyperdensity in the left peri-insular and the left anterior parietal regions probably representing a mixture of contrast, and blood. However, no mass-effect or midline shift or intraparenchymal hemorrhage was noted. The patient was given 60 mL of Brilinta via an orogastric tube in addition to the 325 mg of aspirin earlier. The patient was left intubated as per anesthesia to protect the airway,  and also because of severe aphasia. The patient was then transferred to the ICU for post thrombectomy management. IMPRESSION: Status post endovascular revascularization of occluded left middle cerebral M1 segment due to underlying severe intracranial arteriosclerosis, with 1 pass with the 4 mm x 40 mm Solitaire X retrieval device, 2 passes with the 6 mm x 40 mm Embotrap retrieval device, and placement of a rescue stent in the left middle cerebral artery for persistent reocclusion, achieving a TICI 2C revascularization. PLAN: Follow-up in the clinic 4 weeks post discharge. Electronically Signed   By: Julieanne Cotton M.D.   On: 03/25/2020 10:20   IR CT Head Ltd  Result Date: 03/26/2020 INDICATION: New onset right-sided facial droop and right-sided weakness with aphasia. Occluded left middle cerebral artery M1 segment on CT angiogram of the head and neck. EXAM: 1. EMERGENT LARGE VESSEL OCCLUSION THROMBOLYSIS (anterior CIRCULATION) COMPARISON:  CT angiogram of the head and neck of March 24, 2020. MEDICATIONS: Ancef 2 g IV antibiotic was administered within 1 hour of the procedure. ANESTHESIA/SEDATION: General anesthesia. CONTRAST:  Isovue 300 approximately 150 cc. FLUOROSCOPY TIME:  Fluoroscopy Time: 82 minutes 0 seconds (3115 mGy). COMPLICATIONS: None immediate. TECHNIQUE: Following a full explanation of the procedure along  with the potential associated complications, an informed witnessed consent was obtained. The risks of intracranial hemorrhage of 10%, worsening neurological deficit, ventilator dependency, death and inability to revascularize were all reviewed in detail with the patient's daughter. The patient was then put under general anesthesia by the Department of Anesthesiology at [facility]. The right groin was prepped and draped in the usual sterile fashion. Thereafter using modified Seldinger technique, transfemoral access into the right common femoral artery was obtained without difficulty. Over a 0.035 inch guidewire a 8 French 25 cm Pinnacle sheath was inserted. Through this, and also over a 0.035 inch guidewire an 087 balloon guide catheter with a 5 [place] Berenstein select combination was advanced to the aortic arch region and selectively positioned in the distal left common carotid artery. The guidewire was removed. Good aspiration was obtained from the hub of the balloon guide catheter following withdrawal of the select 5 French catheter. FINDINGS: A control arteriogram performed through the balloon guide catheter centered extra cranially and intracranially demonstrated the left external carotid artery and its major branches to be widely patent. The left internal carotid artery at the bulb to the cranial skull base demonstrates wide patency. The petrous, the cavernous and the supraclinoid segments are widely patent. The left posterior communicating artery is seen opacifying the left posterior cerebral distribution. Angiographic occlusion with moderate tapered narrowing noted of the left middle cerebral artery mid M1 segment. Mild narrowing of the left A1 segment of anterior cerebral artery is noted. More distally the left anterior cerebral artery opacifies into the capillary and venous phases. The delayed arterial phase demonstrates mild collateralization of the left MCA distribution from the pericallosal  artery branches, and the P3 branches of the left posterior cerebral artery. PROCEDURE: A combination of large-bore 130 cm 6 [place] support catheter with 021 Trevo ProVue microcatheter was advanced over a 0.014 inch standard Synchro micro guidewire with a J configuration through the balloon guide catheter to the horizontal petrous segment of the left middle cerebral artery. At this time the balloon guide catheter was advanced to the mid 1/3 of the cervical left ICA. With the micro guidewire leading, and using a torque device, access into the inferior division of the left middle cerebral artery was obtained with the micro  guidewire followed by the microcatheter into the M2 M3 region. The guidewire was removed. Good aspiration obtained from the hub of the microcatheter. A gentle control arteriogram performed through the microcatheter demonstrated safe positioning of the tip of the microcatheter. This was then connected to continuous heparinized saline infusion. A Solitaire 4 mm x 40 mm X retrieval device was then advanced to the distal end of the microcatheter. The O ring on the delivery microcatheter was then loosened. With slight forward gentle traction with the right hand on the delivery micro guidewire with the left hand the delivery microcatheter was retrieved unsheathing the distal and the proximal retrieval device. The large-bore catheter was advanced to the just prior proximal aspect of the inferior division. A control arteriogram performed through the large-bore catheter in the left internal carotid artery demonstrated revascularization in the left MCA distribution with the area of stenosis noted in the mid M1 segment, and also in the proximal inferior division of the left middle cerebral artery M3 region. Thereafter with proximal flow arrest in the left internal carotid artery with balloon dilatation, and constant aspiration with a 60 mL syringe at the hub of the balloon guide catheter, and a Penumbra  aspiration device through the large-bore catheter over 2-1/2 minutes, the combination of the retrieval device, the micro catheter and the large-bore catheter were retrieved and removed as constant aspiration was applied at the hub of the balloon guide catheter. Following reversal of flow arrest, a control arteriogram performed through the balloon guide catheter in the left internal carotid artery demonstrated no relief of the obstructed left middle cerebral artery. A second pass was then made with the combination described above. At this time, following ascertained safe positioning of the tip of the microcatheter in the dominant inferior division M3 region, a 6 mm x 40 mm Solitaire X retrieval device was deployed. Again with proximal flow arrest, in the left internal carotid artery, and with constant aspiration at the hub of the large-bore catheter with Penumbra aspiration device, and a 60 mL syringe at the hub of the balloon guide catheter over approximately 2 minutes, the combination was again retrieved and removed. Following reversal of flow arrest, a control arteriogram performed through the balloon guide demonstrated now revascularization of the left middle cerebral artery proximally, and the inferior division. Tandem stenosis was noted at the mid left M1 segment, and also the inferior division proximally most consistent with arteriosclerotic disease. A repeat arteriogram performed approximately 3 minutes after revascularization demonstrated progressive reocclusion of the left middle cerebral artery. A third pass was then made again using the above combination. Again after having accessed the reoccluded left middle cerebral artery in the M3 inferior division and ascertaining safest position of the tip of the microcatheter, a 5 mm x 33 mm Embotrap retrieval device was then advanced and deployed as mentioned previously. With constant aspiration being applied at the hub of the large-bore catheter just inside the  inferior division for 2-1/2 minutes and proximal flow arrest, and aspiration at the hub of the balloon guide catheter, the combination of the retrieval device, and the microcatheter and the large-bore catheter were removed. Following reversal of flow arrest a control arteriogram performed through the left internal carotid artery again demonstrated only marginally improved flow through the left middle cerebral artery M1 segment with pitiful flow into the inferior division. A fourth pass made again using the above combination with a new microcatheter, and a new large-bore intermediary catheter was advanced over a 0.014 inch standard Synchro  micro guidewire in the inferior division M3 region. The 5 mm x 33 mm Embotrap retrieval device was again deployed as mentioned previously. A control arteriogram performed following deployment of the retrieval device again demonstrated improved caliber and revascularization of the left MCA distribution achieving a TICI 2B revascularization. With proximal flow arrest and constant aspiration at the hub of the 6 French large-bore catheter with the Penumbra aspiration device and constant aspiration at the hub of balloon guide catheter with a 60 mL syringe over 2-1/2 minutes, the combination was retrieved and removed. Following reversal of the flow arrest, a control arteriogram performed through the balloon guide catheter in the left internal carotid artery demonstrated improved flow through the left M1 segment with modestly improved inferior division. There continued to be complete occlusion of the proximal inferior division. A fifth pass was then made using the combination of a 6 [place] Catalyst intermediary catheter with a combination of a Trevo ProVue microcatheter again positioned in the M3 inferior division. Again after having ascertained safe position of the tip of the microcatheter, a 6 mm x 40 mm Solitaire X retrieval device was advanced and deployed. Again with constant  aspiration at the hub of the 6 [place] Catalyst guide catheter now advanced just inside the inferior division, and a 60 mL syringe at the hub of the balloon guide catheter with proximal flow arrest, the combination was retrieved after approximately 2-1/2 minutes. Following removal of the combination and reversal of the flow arrest, a control arteriogram performed through the balloon guide catheter demonstrated modestly improved flow through the left MCA distribution with a TICI 2B revascularization of which continued to slowly occlude over the next few minutes. Three of the five aspiration attempts, only a few specks of a clot aperture was attained in the interstices of the retrieval device, and also the aspirate. It was felt that the recurring occlusion was secondary to the significant arteriosclerotic narrowing in the left M1 segment mid to distal third. At this time, an orogastric tube was inserted. A CT scan of the brain was obtained to evaluate for major intracranial bleed. This demonstrated mild subarachnoid hyperdensity in the left peri-insular and the left anterior parietal regions without evidence mass effect or midline shift. No intraparenchymal hemorrhage was seen. Following discussion with the referring neuro hospitalist, it was elected to proceed with placement of a rescue stent. The patient was given 325 mg of aspirin via the orogastric tube. Patient also given proximally 7.5 mg of Integrilin intra-arterially. Through the balloon guide catheter in the left internal carotid artery a combination of the 6 [place] Catalyst guide catheter with an 021 Trevo ProVue microcatheter was advanced over a 0.014 inch standard Synchro micro to the M3 region of the inferior division followed by the microcatheter. Again the guidewire was removed. Good aspiration obtained from the hub of the microcatheter which was then connected to continuous heparinized saline infusion. Measurements were made of the left middle cerebral  artery M1 segment and also the inferior division from earlier angiographic runs. It was decided to use a 4 mm x 24 mm Neuroform Atlas stent. This was then advanced in a coaxial manner and with constant heparinized saline infusion to the distal end of the microcatheter. The proximal and the distal landing zones were then defined. The O ring on the delivery microcatheter was loosened. The stent was then deployed without difficulty in the usual manner. The delivery micro guidewire was then retrieved and removed. The control arteriogram performed through the balloon guide catheter  demonstrated excellent revascularization of the left MCA distribution proximally and in the trifurcation regions achieving a TICI 2C revascularization. There continued to be moderate stenosis at the M1 M2 junction on the AP projection but widely patent on the lateral projection. Also there was a moderate arteriosclerotic stenosis in the distal M3 segment. No evidence of extravasation, mass-effect or midline shift was noted. A repeat arteriogram performed 10 minutes later continued to demonstrate patency of the left MCA stented segment, and also of the left middle cerebral artery. The left posterior communicating artery remained widely patent. The 6 [place] Catalyst guide catheter was retrieved and removed. A final control arteriogram performed through the balloon guide catheter in the left common carotid artery demonstrates patency of the left internal carotid artery extra cranially and intracranially with patency of the left MCA and the left anterior cerebral distributions. The balloon guide catheter was retrieved and removed. The 8 French Pinnacle sheath in the right groin was then removed with the successful placement of an 8 French Angio-Seal closure device with hemostasis. The right groin appeared soft. The left groin 4 French arterial sheath was left in place for close monitoring of the arterial pressures. Dopplerable in the dorsalis  pedis, the posterior tibial regions bilaterally unchanged. A second CT angiogram performed demonstrated slightly increased subarachnoid hyperdensity in the left peri-insular and the left anterior parietal regions probably representing a mixture of contrast, and blood. However, no mass-effect or midline shift or intraparenchymal hemorrhage was noted. The patient was given 60 mL of Brilinta via an orogastric tube in addition to the 325 mg of aspirin earlier. The patient was left intubated as per anesthesia to protect the airway, and also because of severe aphasia. The patient was then transferred to the ICU for post thrombectomy management. IMPRESSION: Status post endovascular revascularization of occluded left middle cerebral M1 segment due to underlying severe intracranial arteriosclerosis, with 1 pass with the 4 mm x 40 mm Solitaire X retrieval device, 2 passes with the 6 mm x 40 mm Embotrap retrieval device, and placement of a rescue stent in the left middle cerebral artery for persistent reocclusion, achieving a TICI 2C revascularization. PLAN: Follow-up in the clinic 4 weeks post discharge. Electronically Signed   By: Julieanne Cotton M.D.   On: 03/25/2020 10:20   CT CEREBRAL PERFUSION W CONTRAST  Result Date: 03/24/2020 CLINICAL DATA:  Patient found down at 5 a.m. Right-sided weakness. Abnormal speech. Facial droop. EXAM: CT ANGIOGRAPHY HEAD AND NECK CT PERFUSION BRAIN TECHNIQUE: Multidetector CT imaging of the head and neck was performed using the standard protocol during bolus administration of intravenous contrast. Multiplanar CT image reconstructions and MIPs were obtained to evaluate the vascular anatomy. Carotid stenosis measurements (when applicable) are obtained utilizing NASCET criteria, using the distal internal carotid diameter as the denominator. Multiphase CT imaging of the brain was performed following IV bolus contrast injection. Subsequent parametric perfusion maps were calculated using  RAPID software. CONTRAST:  OMNIPAQUE IOHEXOL 350 MG/ML SOLN COMPARISON:  CT head without contrast 03/24/2020 FINDINGS: CTA NECK FINDINGS Aortic arch: A 3 vessel arch configuration is present. Atherosclerotic calcifications are present at the aortic arch. No aneurysm or significant stenosis is present. Right carotid system: Right common carotid artery is within normal limits. Atherosclerotic changes are present at the bifurcation without significant stenosis. Study is mildly degraded by dental artifact. Left carotid system: The left common carotid artery is within normal limits. Atherosclerotic changes are present at the proximal left ICA without a significant stenosis relative to  the more distal vessel. Vertebral arteries: The right vertebral artery is the dominant vessel. Both vertebral arteries originate from the subclavian arteries without significant stenosis. Is no significant stenosis of either vertebral artery in the neck. Skeleton: Degenerative changes are present cervical spine with slight reversal of normal cervical lordosis. No focal lytic or blastic lesions are present. Other neck: The soft tissues the neck are unremarkable. Upper chest: Mild dependent atelectasis is present. Thoracic inlet is normal. Review of the MIP images confirms the above findings CTA HEAD FINDINGS Anterior circulation: Internal carotid arteries are within normal limits through the ICA termini bilaterally. The A1 segments are normal bilaterally. The anterior communicating artery is patent. Right M1 segment is normal. Left M1 segment is occluded proximal to the bifurcation. Collateral vessels are evident within the sylvian fissure. Bilateral ACA and right MCA branch vessels are within normal limits. Posterior circulation: Internal carotid right vertebral artery is the dominant vessel. The non dominant left vertebral artery terminates at the PICA. Right vertebral artery becomes the basilar artery. Both posterior cerebral  arteries originate from basilar tip. Prominent left posterior communicating artery is present. PCA branch vessels are within normal limits bilaterally. Venous sinuses: The dural sinuses are patent. The straight sinus deep cerebral veins are intact. Cortical veins are within normal limits. Anatomic variants: Prominent left posterior communicating artery. Review of the MIP images confirms the above findings CT Brain Perfusion Findings: ASPECTS: 10/10 CBF (<30%) Volume: 83mL Perfusion (Tmax>6.0s) volume: 82mL Mismatch Volume: 99mL Infarction Location:Left MCA IMPRESSION: 1. Large left MCA territory nonhemorrhagic infarct. 2. Acute left M1 large vessel occlusion. 3. Large penumbra with mismatch volume of 99 mL. 4. Collateral vessels are present within the sylvian fissure. 5. Atherosclerotic changes at the carotid bifurcations bilaterally without significant stenosis. 6. Aortic Atherosclerosis (ICD10-I70.0). No significant stenosis at the arch. These results were called by telephone at the time of interpretation on 03/24/2020 at 6:13 am to provider Gi Or Norman , who verbally acknowledged these results. Electronically Signed   By: Marin Roberts M.D.   On: 03/24/2020 06:26   DG CHEST PORT 1 VIEW  Result Date: 04/10/2020 CLINICAL DATA:  History of rib fractures EXAM: PORTABLE CHEST 1 VIEW COMPARISON:  03/31/2020 FINDINGS: Esophageal tube tip is below the diaphragm. Increased airspace disease at the right base. Stable cardiomediastinal silhouette. No pneumothorax. IMPRESSION: Removal of endotracheal tube. Increased airspace disease at the right base may reflect atelectasis, pneumonia or aspiration Electronically Signed   By: Jasmine Pang M.D.   On: 04/10/2020 19:09   DG CHEST PORT 1 VIEW  Result Date: 03/31/2020 CLINICAL DATA:  ET tube EXAM: PORTABLE CHEST 1 VIEW COMPARISON:  03/30/2020 FINDINGS: Endotracheal tube tip is 1.6 cm above the carina. Feeding tube is seen entering the stomach. Low lung  volumes with bibasilar atelectasis and mild vascular congestion. No effusions or pneumothorax. No acute bony abnormality. IMPRESSION: Endotracheal tube 1.6 cm above the carina. Low lung volumes with vascular congestion and bibasilar atelectasis. Electronically Signed   By: Charlett Nose M.D.   On: 03/31/2020 08:38   DG CHEST PORT 1 VIEW  Result Date: 03/30/2020 CLINICAL DATA:  Hypoxia EXAM: PORTABLE CHEST 1 VIEW COMPARISON:  March 29, 2020 FINDINGS: Endotracheal tube tip is 2.9 cm above the carina. Feeding tube tip is below the diaphragm. No pneumothorax. There is mild left base atelectasis. Lungs elsewhere are clear. Heart size and pulmonary vascular normal. No adenopathy. No bone lesions. IMPRESSION: Tube positions as described without pneumothorax. Mild left base atelectasis. Lungs otherwise clear.  Heart size normal. Electronically Signed   By: Bretta Bang III M.D.   On: 03/30/2020 07:59   DG CHEST PORT 1 VIEW  Result Date: 03/29/2020 CLINICAL DATA:  59 year old male-intubation. EXAM: PORTABLE CHEST 1 VIEW COMPARISON:  03/29/2020 and prior exams FINDINGS: An endotracheal tube is now identified with tip 2.5 cm above the carina. A small bore feeding tube enters the stomach with tip off the field of view. Mild LEFT basilar atelectasis is again noted. The cardiomediastinal silhouette is unchanged. No pleural effusion or pneumothorax. IMPRESSION: Endotracheal tube with tip 2.5 cm above the carina. No other significant change. Electronically Signed   By: Harmon Pier M.D.   On: 03/29/2020 17:00   DG CHEST PORT 1 VIEW  Result Date: 03/29/2020 CLINICAL DATA:  59 year old male with history of hypoxemia. EXAM: PORTABLE CHEST 1 VIEW COMPARISON:  Chest x-ray 03/28/2020. FINDINGS: A feeding tube is seen extending into the abdomen, however, the tip of the feeding tube extends below the lower margin of the image. Linear scarring or subsegmental atelectasis in the right mid lung. Lung volumes are low. No  consolidative airspace disease. No pleural effusions. No pneumothorax. No pulmonary nodule or mass noted. Pulmonary vasculature and the cardiomediastinal silhouette are within normal limits. IMPRESSION: Low lung volumes without radiographic evidence of acute cardiopulmonary disease. Electronically Signed   By: Trudie Reed M.D.   On: 03/29/2020 11:07   DG Chest Port 1 View  Result Date: 03/28/2020 CLINICAL DATA:  Fever and leukocytosis EXAM: PORTABLE CHEST 1 VIEW COMPARISON:  March 25, 2020 FINDINGS: The ET tube has been removed. The feeding tube terminates below today's film. No pneumothorax. The cardiomediastinal silhouette is stable. The lungs are clear. IMPRESSION: Support apparatus as above.  No acute abnormalities. Electronically Signed   By: Gerome Sam III M.D   On: 03/28/2020 13:05   Portable Chest xray  Result Date: 03/25/2020 CLINICAL DATA:  Respiratory failure. Left MCA infarct. EXAM: PORTABLE CHEST 1 VIEW COMPARISON:  One-view chest x-ray 06/13/2008 FINDINGS: Heart size is normal. Lung volumes are low. The patient is intubated. Endotracheal tube terminates 4 cm above the carina. NG tube courses off the inferior border the film. IMPRESSION: 1. Endotracheal tube terminates 4 cm above the carina. 2. Low lung volumes. Electronically Signed   By: Marin Roberts M.D.   On: 03/25/2020 06:05   DG Swallowing Func-Speech Pathology  Result Date: 04/08/2020 Objective Swallowing Evaluation: Type of Study: MBS-Modified Barium Swallow Study  Patient Details Name: Rhyland Hinderliter MRN: [id number] Date of Birth: 08-25-1961 Today's Date: 04/08/2020 Time: SLP Start Time (ACUTE ONLY): 1250 -SLP Stop Time (ACUTE ONLY): 1322 SLP Time Calculation (min) (ACUTE ONLY): 32 min Past Medical History: Past Medical History: Diagnosis Date  Diabetes mellitus without complication (HCC)  Past Surgical History: Past Surgical History: Procedure Laterality Date  IR CT HEAD LTD  03/24/2020  IR CT HEAD LTD  03/24/2020  IR  INTRA CRAN STENT  03/24/2020  IR PATIENT EVAL TECH 0-60 MINS  03/25/2020  IR PERCUTANEOUS ART THROMBECTOMY/INFUSION INTRACRANIAL INC DIAG ANGIO  03/24/2020  RADIOLOGY WITH ANESTHESIA N/A 03/24/2020  Procedure: IR WITH ANESTHESIA;  Surgeon: Julieanne Cotton, MD;  Location: [facility] OR;  Service: Radiology;  Laterality: N/A; HPI: 59 y.o. male past medical history of diabetes, hypertension which he does not take medication for, presented to the emergency room at [facility] with last known well of 5 AM when he was at work and was noted to have a sudden onset of right-sided weakness and inability  to talk. Pt given TPA and s/p thrombectomy on 4/20. Pt remains intubated and restrained as of 4/22.  PT was intubated from 4/20-4/22.  Head CT reported a large left MCA infarct.   Subjective: pt alert, cooperative Assessment / Plan / Recommendation CHL IP CLINICAL IMPRESSIONS 04/08/2020 Clinical Impression Pt presents wtih a moderate oropharyngeal dysphagia with delays in oral transit and pharyngeal swallow initiation. His daughter, Verlee Monte, said that his time for oral transit is not any longer than it is at his baseline, but during MBS he was not able to masticate even a small piece of graham cracker. The whole piece spilled anteriorly from his mouth. He has lingual pumping and reduced organization. At times his pyriform sinuses become more full before the swallow as he continues to clear his oral cavity. When this happens with nectar thick liquids, he penetrates and aspirates during the swallow as they spill into his airway. He did have an episode of aspiration before the swallow when they spilled directly into the airway when he had his head in a posterior tilt. Pt does not immediately cough in response and he does not cough on command, but a delayed cough was suspicious for delayed sensation. He had good airway protection and pharyngeal clearance with purees. Intermittent penetration occurred with honey thick liquids but was  trace and cleared with subsequent swallows. Recommend starting Dys 1 diet and honey thick liquids by spoon with full supervision. He has potential for diet advancement if he can improve his oral control and/or follow commands more consistently for use of compensatory strategies.  SLP Visit Diagnosis Dysphagia, oropharyngeal phase (R13.12) Attention and concentration deficit following -- Frontal lobe and executive function deficit following -- Impact on safety and function Moderate aspiration risk   CHL IP TREATMENT RECOMMENDATION 04/08/2020 Treatment Recommendations Therapy as outlined in treatment plan below   Prognosis 04/08/2020 Prognosis for Safe Diet Advancement Good Barriers to Reach Goals Language deficits Barriers/Prognosis Comment -- CHL IP DIET RECOMMENDATION 04/08/2020 SLP Diet Recommendations Dysphagia 1 (Puree) solids;Honey thick liquids Liquid Administration via Spoon Medication Administration Crushed with puree Compensations Slow rate;Small sips/bites Postural Changes Seated upright at 90 degrees   CHL IP OTHER RECOMMENDATIONS 04/08/2020 Recommended Consults -- Oral Care Recommendations Oral care BID Other Recommendations Have oral suction available   CHL IP FOLLOW UP RECOMMENDATIONS 04/08/2020 Follow up Recommendations Inpatient Rehab   CHL IP FREQUENCY AND DURATION 04/08/2020 Speech Therapy Frequency (ACUTE ONLY) min 2x/week Treatment Duration 2 weeks      CHL IP ORAL PHASE 04/08/2020 Oral Phase Impaired Oral - Pudding Teaspoon -- Oral - Pudding Cup -- Oral - Honey Teaspoon Weak lingual manipulation;Lingual pumping;Reduced posterior propulsion;Holding of bolus;Delayed oral transit;Decreased bolus cohesion;Premature spillage Oral - Honey Cup Weak lingual manipulation;Lingual pumping;Reduced posterior propulsion;Holding of bolus;Delayed oral transit;Decreased bolus cohesion;Premature spillage Oral - Nectar Teaspoon Weak lingual manipulation;Lingual pumping;Reduced posterior propulsion;Holding of bolus;Delayed oral  transit;Decreased bolus cohesion;Premature spillage Oral - Nectar Cup Weak lingual manipulation;Lingual pumping;Reduced posterior propulsion;Holding of bolus;Delayed oral transit;Decreased bolus cohesion;Premature spillage Oral - Nectar Straw -- Oral - Thin Teaspoon -- Oral - Thin Cup -- Oral - Thin Straw -- Oral - Puree Weak lingual manipulation;Lingual pumping;Reduced posterior propulsion;Holding of bolus;Delayed oral transit;Decreased bolus cohesion;Premature spillage Oral - Mech Soft Impaired mastication;Right anterior bolus loss Oral - Regular -- Oral - Multi-Consistency -- Oral - Pill -- Oral Phase - Comment --  CHL IP PHARYNGEAL PHASE 04/08/2020 Pharyngeal Phase Impaired Pharyngeal- Pudding Teaspoon -- Pharyngeal -- Pharyngeal- Pudding Cup -- Pharyngeal -- Pharyngeal- Honey Teaspoon  Delayed swallow initiation-vallecula;Penetration/Aspiration before swallow Pharyngeal Material enters airway, remains ABOVE vocal cords then ejected out Pharyngeal- Honey Cup Delayed swallow initiation-vallecula;Delayed swallow initiation-pyriform sinuses;Penetration/Aspiration before swallow Pharyngeal Material enters airway, remains ABOVE vocal cords then ejected out Pharyngeal- Nectar Teaspoon Delayed swallow initiation-pyriform sinuses;Penetration/Aspiration before swallow;Penetration/Aspiration during swallow Pharyngeal Material enters airway, passes BELOW cords without attempt by patient to eject out (silent aspiration) Pharyngeal- Nectar Cup Delayed swallow initiation-pyriform sinuses;Penetration/Aspiration during swallow;Penetration/Aspiration before swallow Pharyngeal Material enters airway, passes BELOW cords without attempt by patient to eject out (silent aspiration) Pharyngeal- Nectar Straw -- Pharyngeal -- Pharyngeal- Thin Teaspoon -- Pharyngeal -- Pharyngeal- Thin Cup -- Pharyngeal -- Pharyngeal- Thin Straw -- Pharyngeal -- Pharyngeal- Puree Delayed swallow initiation-vallecula Pharyngeal -- Pharyngeal- Mechanical Soft  -- Pharyngeal -- Pharyngeal- Regular -- Pharyngeal -- Pharyngeal- Multi-consistency -- Pharyngeal -- Pharyngeal- Pill -- Pharyngeal -- Pharyngeal Comment --  CHL IP CERVICAL ESOPHAGEAL PHASE 04/08/2020 Cervical Esophageal Phase WFL Pudding Teaspoon -- Pudding Cup -- Honey Teaspoon -- Honey Cup -- Nectar Teaspoon -- Nectar Cup -- Nectar Straw -- Thin Teaspoon -- Thin Cup -- Thin Straw -- Puree -- Mechanical Soft -- Regular -- Multi-consistency -- Pill -- Cervical Esophageal Comment -- Mahala Menghini., M.A. CCC-SLP Acute Rehabilitation Services Pager [phone] Office [phone] 04/08/2020, 2:38 PM              IR PATIENT EVAL TECH 0-60 MINS  Result Date: 03/25/2020 Carlyon Prows     03/25/2020 10:42 AM Left 4 french sheath removal:  No hematoma and pulses present prior to sheath removal.  Sheath removed at 1003 and held manual pressure for 15 minutes. No complications, pulses present and groin site level 0.  Verified groin site with [facility]. Lynann Beaver RT VI, R  VAS [name] TRANSCRANIAL DOPPLER W BUBBLES  Result Date: 04/01/2020  Transcranial Doppler with Bubble Indications: Stroke. Comparison Study: No prior study Performing Technologist: Gertie Fey MHA, RDMS, RVT, RDCS  Examination Guidelines: A complete evaluation includes B-mode imaging, spectral Doppler, color Doppler, and power Doppler as needed of all accessible portions of each vessel. Bilateral testing is considered an integral part of a complete examination. Limited examinations for reoccurring indications may be performed as noted.  Summary:  A vascular evaluation was performed. The right middle cerebral artery was studied. An IV was inserted into the patient's right forearm. Verbal informed consent was obtained.  A small amount of HITS (high intensity transient signals) were detected, suggestive of trivial, clinically insignificant PFO (patent foramen ovale). Positive TCD Bubble study indicative of small right to left shunt *See table(s) above for  TCD measurements and observations.  Diagnosing physician: Delia Heady MD Electronically signed by Delia Heady MD on 04/01/2020 at 6:53:24 AM.    Final    ECHOCARDIOGRAM COMPLETE  Result Date: 03/24/2020    ECHOCARDIOGRAM REPORT   Patient Name:   FERDIE BAKKEN Date of Exam: 03/24/2020 Medical Rec #:  [id number]        Height:       65.0 in Accession #:    [id number]       Weight:       189.4 lb Date of Birth:  1961-08-19       BSA:          1.933 m Patient Age:    58 years         BP:           119/53 mmHg Patient Gender: M  HR:           56 bpm. Exam Location:  Inpatient Procedure: 2D Echo, Cardiac Doppler and Color Doppler Indications:    Stroke 434.91 / I163.9  History:        Patient has no prior history of Echocardiogram examinations.                 Risk Factors:Diabetes.  Sonographer:    Elmarie Shiley Dance Referring Phys: [id number] ASHISH ARORA IMPRESSIONS  1. Normal LV systolic function; grade 1 diastolic dysfunction.  2. Left ventricular ejection fraction, by estimation, is 55 to 60%. The left ventricle has normal function. The left ventricle has no regional wall motion abnormalities. Left ventricular diastolic parameters are consistent with Grade I diastolic dysfunction (impaired relaxation).  3. Right ventricular systolic function is normal. The right ventricular size is normal.  4. The mitral valve is normal in structure. No evidence of mitral valve regurgitation. No evidence of mitral stenosis.  5. The aortic valve is tricuspid. Aortic valve regurgitation is not visualized. Mild aortic valve sclerosis is present, with no evidence of aortic valve stenosis. FINDINGS  Left Ventricle: Left ventricular ejection fraction, by estimation, is 55 to 60%. The left ventricle has normal function. The left ventricle has no regional wall motion abnormalities. The left ventricular internal cavity size was normal in size. There is  no left ventricular hypertrophy. Left ventricular diastolic parameters are  consistent with Grade I diastolic dysfunction (impaired relaxation). Right Ventricle: The right ventricular size is normal. Right ventricular systolic function is normal. Left Atrium: Left atrial size was normal in size. Right Atrium: Right atrial size was normal in size. Pericardium: There is no evidence of pericardial effusion. Mitral Valve: The mitral valve is normal in structure. Normal mobility of the mitral valve leaflets. No evidence of mitral valve regurgitation. No evidence of mitral valve stenosis. Tricuspid Valve: The tricuspid valve is normal in structure. Tricuspid valve regurgitation is not demonstrated. No evidence of tricuspid stenosis. Aortic Valve: The aortic valve is tricuspid. Aortic valve regurgitation is not visualized. Mild aortic valve sclerosis is present, with no evidence of aortic valve stenosis. Pulmonic Valve: The pulmonic valve was normal in structure. Pulmonic valve regurgitation is not visualized. No evidence of pulmonic stenosis. Aorta: The aortic root is normal in size and structure. Venous: The inferior vena cava was not well visualized.  Additional Comments: Normal LV systolic function; grade 1 diastolic dysfunction.  LEFT VENTRICLE PLAX 2D LVIDd:         3.09 cm  Diastology LVIDs:         2.86 cm  LV e' lateral:   4.57 cm/s LV PW:         0.99 cm  LV E/e' lateral: 9.2 LV IVS:        0.94 cm  LV e' medial:    4.90 cm/s LVOT diam:     2.10 cm  LV E/e' medial:  8.6 LV SV:         62 LV SV Index:   32 LVOT Area:     3.46 cm  RIGHT VENTRICLE             IVC RV Basal diam:  2.12 cm     IVC diam: 2.18 cm RV S prime:     10.20 cm/s TAPSE (M-mode): 2.1 cm LEFT ATRIUM             Index       RIGHT ATRIUM  Index LA diam:        3.10 cm 1.60 cm/m  RA Area:     10.60 cm LA Vol (A2C):   35.2 ml 18.21 ml/m RA Volume:   17.40 ml  9.00 ml/m LA Vol (A4C):   35.0 ml 18.11 ml/m LA Biplane Vol: 35.2 ml 18.21 ml/m  AORTIC VALVE LVOT Vmax:   79.10 cm/s LVOT Vmean:  47.100 cm/s LVOT VTI:     0.180 m  AORTA Ao Root diam: 3.50 cm Ao Asc diam:  3.45 cm MITRAL VALVE MV Area (PHT): 1.78 cm    SHUNTS MV Decel Time: 426 msec    Systemic VTI:  0.18 m MV E velocity: 42.20 cm/s  Systemic Diam: 2.10 cm MV A velocity: 45.60 cm/s MV E/A ratio:  0.93 Olga Millers MD Electronically signed by Olga Millers MD Signature Date/Time: 03/24/2020/2:36:05 PM    Final    IR PERCUTANEOUS ART THROMBECTOMY/INFUSION INTRACRANIAL INC DIAG ANGIO  Result Date: 03/26/2020 INDICATION: New onset right-sided facial droop and right-sided weakness with aphasia. Occluded left middle cerebral artery M1 segment on CT angiogram of the head and neck. EXAM: 1. EMERGENT LARGE VESSEL OCCLUSION THROMBOLYSIS (anterior CIRCULATION) COMPARISON:  CT angiogram of the head and neck of March 24, 2020. MEDICATIONS: Ancef 2 g IV antibiotic was administered within 1 hour of the procedure. ANESTHESIA/SEDATION: General anesthesia. CONTRAST:  Isovue 300 approximately 150 cc. FLUOROSCOPY TIME:  Fluoroscopy Time: 82 minutes 0 seconds (3115 mGy). COMPLICATIONS: None immediate. TECHNIQUE: Following a full explanation of the procedure along with the potential associated complications, an informed witnessed consent was obtained. The risks of intracranial hemorrhage of 10%, worsening neurological deficit, ventilator dependency, death and inability to revascularize were all reviewed in detail with the patient's daughter. The patient was then put under general anesthesia by the Department of Anesthesiology at [facility]. The right groin was prepped and draped in the usual sterile fashion. Thereafter using modified Seldinger technique, transfemoral access into the right common femoral artery was obtained without difficulty. Over a 0.035 inch guidewire a 8 French 25 cm Pinnacle sheath was inserted. Through this, and also over a 0.035 inch guidewire an 087 balloon guide catheter with a 5 [place] Berenstein select combination was advanced to the aortic  arch region and selectively positioned in the distal left common carotid artery. The guidewire was removed. Good aspiration was obtained from the hub of the balloon guide catheter following withdrawal of the select 5 French catheter. FINDINGS: A control arteriogram performed through the balloon guide catheter centered extra cranially and intracranially demonstrated the left external carotid artery and its major branches to be widely patent. The left internal carotid artery at the bulb to the cranial skull base demonstrates wide patency. The petrous, the cavernous and the supraclinoid segments are widely patent. The left posterior communicating artery is seen opacifying the left posterior cerebral distribution. Angiographic occlusion with moderate tapered narrowing noted of the left middle cerebral artery mid M1 segment. Mild narrowing of the left A1 segment of anterior cerebral artery is noted. More distally the left anterior cerebral artery opacifies into the capillary and venous phases. The delayed arterial phase demonstrates mild collateralization of the left MCA distribution from the pericallosal artery branches, and the P3 branches of the left posterior cerebral artery. PROCEDURE: A combination of large-bore 130 cm 6 [place] support catheter with 021 Trevo ProVue microcatheter was advanced over a 0.014 inch standard Synchro micro guidewire with a J configuration through the balloon guide catheter to the horizontal  petrous segment of the left middle cerebral artery. At this time the balloon guide catheter was advanced to the mid 1/3 of the cervical left ICA. With the micro guidewire leading, and using a torque device, access into the inferior division of the left middle cerebral artery was obtained with the micro guidewire followed by the microcatheter into the M2 M3 region. The guidewire was removed. Good aspiration obtained from the hub of the microcatheter. A gentle control arteriogram performed through the  microcatheter demonstrated safe positioning of the tip of the microcatheter. This was then connected to continuous heparinized saline infusion. A Solitaire 4 mm x 40 mm X retrieval device was then advanced to the distal end of the microcatheter. The O ring on the delivery microcatheter was then loosened. With slight forward gentle traction with the right hand on the delivery micro guidewire with the left hand the delivery microcatheter was retrieved unsheathing the distal and the proximal retrieval device. The large-bore catheter was advanced to the just prior proximal aspect of the inferior division. A control arteriogram performed through the large-bore catheter in the left internal carotid artery demonstrated revascularization in the left MCA distribution with the area of stenosis noted in the mid M1 segment, and also in the proximal inferior division of the left middle cerebral artery M3 region. Thereafter with proximal flow arrest in the left internal carotid artery with balloon dilatation, and constant aspiration with a 60 mL syringe at the hub of the balloon guide catheter, and a Penumbra aspiration device through the large-bore catheter over 2-1/2 minutes, the combination of the retrieval device, the micro catheter and the large-bore catheter were retrieved and removed as constant aspiration was applied at the hub of the balloon guide catheter. Following reversal of flow arrest, a control arteriogram performed through the balloon guide catheter in the left internal carotid artery demonstrated no relief of the obstructed left middle cerebral artery. A second pass was then made with the combination described above. At this time, following ascertained safe positioning of the tip of the microcatheter in the dominant inferior division M3 region, a 6 mm x 40 mm Solitaire X retrieval device was deployed. Again with proximal flow arrest, in the left internal carotid artery, and with constant aspiration at the hub of  the large-bore catheter with Penumbra aspiration device, and a 60 mL syringe at the hub of the balloon guide catheter over approximately 2 minutes, the combination was again retrieved and removed. Following reversal of flow arrest, a control arteriogram performed through the balloon guide demonstrated now revascularization of the left middle cerebral artery proximally, and the inferior division. Tandem stenosis was noted at the mid left M1 segment, and also the inferior division proximally most consistent with arteriosclerotic disease. A repeat arteriogram performed approximately 3 minutes after revascularization demonstrated progressive reocclusion of the left middle cerebral artery. A third pass was then made again using the above combination. Again after having accessed the reoccluded left middle cerebral artery in the M3 inferior division and ascertaining safest position of the tip of the microcatheter, a 5 mm x 33 mm Embotrap retrieval device was then advanced and deployed as mentioned previously. With constant aspiration being applied at the hub of the large-bore catheter just inside the inferior division for 2-1/2 minutes and proximal flow arrest, and aspiration at the hub of the balloon guide catheter, the combination of the retrieval device, and the microcatheter and the large-bore catheter were removed. Following reversal of flow arrest a control arteriogram performed through the  left internal carotid artery again demonstrated only marginally improved flow through the left middle cerebral artery M1 segment with pitiful flow into the inferior division. A fourth pass made again using the above combination with a new microcatheter, and a new large-bore intermediary catheter was advanced over a 0.014 inch standard Synchro micro guidewire in the inferior division M3 region. The 5 mm x 33 mm Embotrap retrieval device was again deployed as mentioned previously. A control arteriogram performed following deployment  of the retrieval device again demonstrated improved caliber and revascularization of the left MCA distribution achieving a TICI 2B revascularization. With proximal flow arrest and constant aspiration at the hub of the 6 French large-bore catheter with the Penumbra aspiration device and constant aspiration at the hub of balloon guide catheter with a 60 mL syringe over 2-1/2 minutes, the combination was retrieved and removed. Following reversal of the flow arrest, a control arteriogram performed through the balloon guide catheter in the left internal carotid artery demonstrated improved flow through the left M1 segment with modestly improved inferior division. There continued to be complete occlusion of the proximal inferior division. A fifth pass was then made using the combination of a 6 [place] Catalyst intermediary catheter with a combination of a Trevo ProVue microcatheter again positioned in the M3 inferior division. Again after having ascertained safe position of the tip of the microcatheter, a 6 mm x 40 mm Solitaire X retrieval device was advanced and deployed. Again with constant aspiration at the hub of the 6 [place] Catalyst guide catheter now advanced just inside the inferior division, and a 60 mL syringe at the hub of the balloon guide catheter with proximal flow arrest, the combination was retrieved after approximately 2-1/2 minutes. Following removal of the combination and reversal of the flow arrest, a control arteriogram performed through the balloon guide catheter demonstrated modestly improved flow through the left MCA distribution with a TICI 2B revascularization of which continued to slowly occlude over the next few minutes. Three of the five aspiration attempts, only a few specks of a clot aperture was attained in the interstices of the retrieval device, and also the aspirate. It was felt that the recurring occlusion was secondary to the significant arteriosclerotic narrowing in the left M1 segment  mid to distal third. At this time, an orogastric tube was inserted. A CT scan of the brain was obtained to evaluate for major intracranial bleed. This demonstrated mild subarachnoid hyperdensity in the left peri-insular and the left anterior parietal regions without evidence mass effect or midline shift. No intraparenchymal hemorrhage was seen. Following discussion with the referring neuro hospitalist, it was elected to proceed with placement of a rescue stent. The patient was given 325 mg of aspirin via the orogastric tube. Patient also given proximally 7.5 mg of Integrilin intra-arterially. Through the balloon guide catheter in the left internal carotid artery a combination of the 6 [place] Catalyst guide catheter with an 021 Trevo ProVue microcatheter was advanced over a 0.014 inch standard Synchro micro to the M3 region of the inferior division followed by the microcatheter. Again the guidewire was removed. Good aspiration obtained from the hub of the microcatheter which was then connected to continuous heparinized saline infusion. Measurements were made of the left middle cerebral artery M1 segment and also the inferior division from earlier angiographic runs. It was decided to use a 4 mm x 24 mm Neuroform Atlas stent. This was then advanced in a coaxial manner and with constant heparinized saline infusion to the distal  end of the microcatheter. The proximal and the distal landing zones were then defined. The O ring on the delivery microcatheter was loosened. The stent was then deployed without difficulty in the usual manner. The delivery micro guidewire was then retrieved and removed. The control arteriogram performed through the balloon guide catheter demonstrated excellent revascularization of the left MCA distribution proximally and in the trifurcation regions achieving a TICI 2C revascularization. There continued to be moderate stenosis at the M1 M2 junction on the AP projection but widely patent on the  lateral projection. Also there was a moderate arteriosclerotic stenosis in the distal M3 segment. No evidence of extravasation, mass-effect or midline shift was noted. A repeat arteriogram performed 10 minutes later continued to demonstrate patency of the left MCA stented segment, and also of the left middle cerebral artery. The left posterior communicating artery remained widely patent. The 6 [place] Catalyst guide catheter was retrieved and removed. A final control arteriogram performed through the balloon guide catheter in the left common carotid artery demonstrates patency of the left internal carotid artery extra cranially and intracranially with patency of the left MCA and the left anterior cerebral distributions. The balloon guide catheter was retrieved and removed. The 8 French Pinnacle sheath in the right groin was then removed with the successful placement of an 8 French Angio-Seal closure device with hemostasis. The right groin appeared soft. The left groin 4 French arterial sheath was left in place for close monitoring of the arterial pressures. Dopplerable in the dorsalis pedis, the posterior tibial regions bilaterally unchanged. A second CT angiogram performed demonstrated slightly increased subarachnoid hyperdensity in the left peri-insular and the left anterior parietal regions probably representing a mixture of contrast, and blood. However, no mass-effect or midline shift or intraparenchymal hemorrhage was noted. The patient was given 60 mL of Brilinta via an orogastric tube in addition to the 325 mg of aspirin earlier. The patient was left intubated as per anesthesia to protect the airway, and also because of severe aphasia. The patient was then transferred to the ICU for post thrombectomy management. IMPRESSION: Status post endovascular revascularization of occluded left middle cerebral M1 segment due to underlying severe intracranial arteriosclerosis, with 1 pass with the 4 mm x 40 mm Solitaire X  retrieval device, 2 passes with the 6 mm x 40 mm Embotrap retrieval device, and placement of a rescue stent in the left middle cerebral artery for persistent reocclusion, achieving a TICI 2C revascularization. PLAN: Follow-up in the clinic 4 weeks post discharge. Electronically Signed   By: Julieanne Cotton M.D.   On: 03/25/2020 10:20   CT HEAD CODE STROKE WO CONTRAST  Result Date: 03/24/2020 CLINICAL DATA:  Code stroke. Ataxia. Right-sided weakness. Abnormal speech. Facial droop. Symptoms began at 5 a.m. EXAM: CT HEAD WITHOUT CONTRAST TECHNIQUE: Contiguous axial images were obtained from the base of the skull through the vertex without intravenous contrast. COMPARISON:  None. FINDINGS: Brain: No acute infarct, hemorrhage, or mass lesion is present. Remote infarct is present in the left corona radiata and anterior limb internal capsule. The basal ganglia are intact. Insular ribbon is normal. No acute hemorrhage or mass lesion is present. The ventricles are of normal size. No significant extraaxial fluid collection is present. The brainstem and cerebellum are within normal limits. Vascular: No hyperdense vessel or unexpected calcification. Skull: Calvarium is intact. No focal lytic or blastic lesions are present. No significant extracranial soft tissue lesion is present. Sinuses/Orbits: The paranasal sinuses and mastoid air cells are clear. The  globes and orbits are within normal limits. ASPECTS [facility] Stroke Program Early CT Score) - Ganglionic level infarction (caudate, lentiform nuclei, internal capsule, insula, M1-M3 cortex): 7/7 - Supraganglionic infarction (M4-M6 cortex): 3/3 Total score (0-10 with 10 being normal): 10/10 IMPRESSION: 1. No acute intracranial abnormality. 2. 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 3. ASPECTS is 10/10 These results were called by telephone at the time of interpretation on 03/24/2020 at 5:46 am to provider [facility] , who verbally  acknowledged these results. Electronically Signed   By: Marin Roberts M.D.   On: 03/24/2020 05:46   VAS [name] LOWER EXTREMITY VENOUS (DVT)  Result Date: 03/26/2020  Lower Venous DVTStudy Indications: Stroke.  Comparison Study: no prior Performing Technologist: [name] RDMS, RVT  Examination Guidelines: A complete evaluation includes B-mode imaging, spectral Doppler, color Doppler, and power Doppler as needed of all accessible portions of each vessel. Bilateral testing is considered an integral part of a complete examination. Limited examinations for reoccurring indications may be performed as noted. The reflux portion of the exam is performed with the patient in reverse Trendelenburg.  +---------+---------------+---------+-----------+----------+--------------+ RIGHT    CompressibilityPhasicitySpontaneityPropertiesThrombus Aging +---------+---------------+---------+-----------+----------+--------------+ CFV      Full           Yes      Yes                                 +---------+---------------+---------+-----------+----------+--------------+ SFJ      Full                                                        +---------+---------------+---------+-----------+----------+--------------+ FV Prox  Full                                                        +---------+---------------+---------+-----------+----------+--------------+ FV Mid   Full                                                        +---------+---------------+---------+-----------+----------+--------------+ FV DistalFull                                                        +---------+---------------+---------+-----------+----------+--------------+ PFV      Full                                                        +---------+---------------+---------+-----------+----------+--------------+ POP      Full           Yes      Yes                                  +---------+---------------+---------+-----------+----------+--------------+  PTV      Full                                                        +---------+---------------+---------+-----------+----------+--------------+ PERO     Full                                                        +---------+---------------+---------+-----------+----------+--------------+   +---------+---------------+---------+-----------+----------+--------------+ LEFT     CompressibilityPhasicitySpontaneityPropertiesThrombus Aging +---------+---------------+---------+-----------+----------+--------------+ CFV      Full           Yes      Yes                                 +---------+---------------+---------+-----------+----------+--------------+ SFJ      Full                                                        +---------+---------------+---------+-----------+----------+--------------+ FV Prox  Full                                                        +---------+---------------+---------+-----------+----------+--------------+ FV Mid   Full                                                        +---------+---------------+---------+-----------+----------+--------------+ FV DistalFull                                                        +---------+---------------+---------+-----------+----------+--------------+ PFV      Full                                                        +---------+---------------+---------+-----------+----------+--------------+ POP      Full           Yes      Yes                                 +---------+---------------+---------+-----------+----------+--------------+ PTV      Full                                                        +---------+---------------+---------+-----------+----------+--------------+  PERO     Full                                                         +---------+---------------+---------+-----------+----------+--------------+     Summary: RIGHT: - There is no evidence of deep vein thrombosis in the lower extremity.  - No cystic structure found in the popliteal fossa.  LEFT: - There is no evidence of deep vein thrombosis in the lower extremity.  - No cystic structure found in the popliteal fossa.  *See table(s) above for measurements and observations. Electronically signed by Sherald Hess MD on 03/26/2020 at 8:08:31 PM.    Final    [name] EKG SITE RITE  Result Date: 03/30/2020 If Site Rite image not attached, placement could not be confirmed due to current cardiac rhythm.      HISTORY OF PRESENT ILLNESS Alejos Reinhardt is a 59 y.o. Hispanic male with past medical history of diabetes and hypertension for which he does not take medication for, presented to the emergency room at [facility] with last known well of 5 AM on 03/24/2020 when he was at work and was noted to have a sudden onset of right-sided weakness and inability to talk.  He was evaluated by teleneurology.  NIH stroke scale initially was noted to be 28.  CT head with hyperdense left MCA and CT angiogram with left M1 occlusion.  Exam consistent with left MCA syndrome.  CT perfusion study was done but he was within 6 hours-it showed a large code of 43 cc-Case was discussed with the interventionalists by the outgoing on-call neurologist and decision was made to offer thrombectomy.  Patient was not given IV TPA at [facility] because they could not get history of whether he was on anticoagulation or not.  In the time that the patient was being transferred to [facility], daughter was able to be contacted, who was not sure if he is on anticoagulation.  She took some time and found out by the way of patient's wife that he is only taking medications for diabetes, does not take medications for his high blood pressure, and is not on any anticoagulation. As soon as the patient  arrived to [facility], he was evaluated.  Exam continued to be consistent with left MCA syndrome.  Systolic blood pressure was in the 210s.  Required 1 dose of labetalol IV and starting Cleviprex before bringing the blood pressure down to goal to be able to give IV TPA.  Risks and benefits of IV TPA as well as thrombectomy were discussed with the daughter over the phone who provided verbal consent for IV TPA and witnessed consent by RN for the procedure. The patient was given IV TPA in the interventional radiology suite intubation bay prior to intubating the patient for IR. Premorbid modified Rankin scale (mRS): 0   HOSPITAL COURSE Mr. Porter Moes is a 59 y.o. male with history of HTN and DB noncompliant w/ medications presenting to [facility] with R sided weakness and inability to talk. NIH 28. CTA showed L M1 occlusion. Transferred to [employer] [facility]. On arrival, received IV tPA 03/24/2020 at 0725 followed by mechanical thrombectomy w/ TICI2C revascularization and rescue stent placement. Admitted to the ICU. transititioned out with transfer back to neuro ICU 5/1 with  worsening edema for hypertonic saline protocol.     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 Old L corona radiata and internal capsule infarcts. ASPECTS 10.    CTA head & neck large L MCA territory infarct. L M1 LVO. Collateral vessels in sylvian fissure. B ICA bifurcation atherosclerosis. Aortic atherosclerosis.  CT perfusion large L MCA penumbra.  Cerebral angio L MCA M1 occlusion w/ TICI2c revascularization x 6 passes. Rescue stenting for reocclusion  CT head 4/21 - evolving large left MCA infarct, trace midline shift, persistent high density and left C1 fissure, contrast versus blood MRI  Large L MCA  infarct evolution w/ edema and mass effect w/ 3mm L midline shift. SAH L sylvian fissure, L cerebral cortical sulci and basilar cisterns. MRA  L M1 stent w/o flow c/w reocclusion. L ICA filling defect c/w small thrombus. VBJ w/ infundibulum vs aneurysm. CT head 4/23 continued evolution large L MCA infarct w/ increased edema, now w/ 5-87mm midline shift. Stable L SAH w/ trace blood in tentorium.   CT head 4/24 - stable 5 mm shift. Large Lt MCA infarct. Subarachnoid blood and/or contrast in and around the left Sylvian fissure.  CT Head 03/29/2020 - Sequelae of large Left MCA territory infarct remain stable since 03/27/2020. Rightward midline shift remains 5 mm. CT Head 04/04/2020 - Evolving large left MCA territory infarction with probable petechial hemorrhage. Progression of edema and mass effect with midline shift now measuring 9 mm and probable early trapping of the right lateral ventricle. CT Head 04/06/2020 - unchanged, still w/ 9mm midline shift   CT Head 04/08/2020 - evolving L MCA infarct w/ similar mass effect  2D Echo EF 55-60%. No source of embolus  LE doppler neg DVT TCD bubble study positive for clinically insignificant PFO    May consider TEE and loop recorder at outpt with neuro follow up if further neuro improvement  LDL 55.1 TG 798->675->269 (on 4/22)  HgbA1c 9.2 SCDs for VTE prophylaxis No antithrombotic prior to admission, now on aspirin 81 mg daily and Brilinta (ticagrelor) 90 mg bid given stent placement and Lt ICA thrombus.  Therapy recommendations: CIR Disposition:  [facility] rehab facility   Cerebral edema, resolving Induced Hypernatremia, resolved CT showed large left MCA infarct with minimal midline shift MRI  Large L MCA infarct evolution w/ edema and mass effect w/ 3mm L midline shift.  CT head 4/23 continued evolution large L MCA infarct w/ increased edema, now w/ 5-45mm midline shift. Stable L SAH w/ trace blood in tentorium.  CT head 4/25 stable 5mm MLS CT Head 04/04/2020 -  Evolving large left MCA territory infarction with probable petechial hemorrhage. Progression of edema and mass effect with midline shift now measuring 9 mm and probable early trapping of the right lateral ventricle. CT Head 04/06/2020 - unchanged, still w/ 9mm midline shift   CT Head 04/08/2020 - evolving L MCA infarct w/ similar mass effect 3% saline off Na 136   Acute Respiratory Failure, resolved PNA, resolved Intubated for IR, left intubated post IR for airway protection secondary to acute L MCA stroke Extubation 4/22 Tolerating well so far Temp - afebrile ; WBCs - 9.3->7.8->8.2 (temp 97.6) Ancef 4/27>>5/2 for pneumonia CXR - 04/10/20 - Removal of endotracheal tube. Increased airspace disease at the right  base may reflect atelectasis, pneumonia or aspiration   Hypertensive Emergency SBP > 210 on arrival  Home meds:  Non-compliant - med rec lists:  losartan 25  Cleviprex changed to cardene d/t elevated TG -> now off Cozaar 50 daily Hydralazine PRN Long-term BP goal normotensive   Hyperlipidemia Home meds:  Non-compliant -  pravachol 10, crestor 5 LDL 55.1, goal < 70 On lipitor 80 Continue statin at discharge   Diabetes type II Uncontrolled Home meds:  Noncompliant - med rec lists:  dapagliflozin 10, glipizide 5, actos 30, dapagliflozin 10 / metformin 1000 HgbA1c 9.2, goal < 7.0 Lantus 30 bid, novolog 5 u tid  CBGs SSI 0-15 DM coordinator on board   Dysphagia Malnutirition Secondary to stroke Treated with Cortrak and tube feeding while NPO Now on dys 1 and honey thick liquid Tube feeding d/c'd once calore count with good intake Speech on board Encourage po intake  Other Stroke Risk Factors Hx ETOH use, alcohol level <10, will advise family that he should drink no more than 2 drink(s) a day Substance abuse - UDS:  Benzos POSITIVE Overweight, Body mass index is 29.42 kg/m., recommend weight loss, diet and exercise as appropriate    Other Active Problems Thrombocytopenia  PLT - resolved Hypokalemia K - resolved  Urinary retention - I&O cath, foley prn Pt seems to hold his stomach with grimace while working with PT/OT. Wife said pt had rib fracture in the past. CXR and KUB unremarkable.    DISCHARGE EXAM Blood pressure 116/69, pulse 90, temperature 98.4 F (36.9 C), temperature source Axillary, resp. rate 18, height 5\' 5"  (1.651 m), weight 75.6 kg, SpO2 96 %. General -mildly obese middle-aged Hispanic male, not in acute distress   Ophthalmologic - fundi not visualized due to noncooperation.   Cardiovascular - Regular rhythm and rate.   Abdomen - no tenderness on palpitation of abdomen or lower chest, rib   Neuro - eyes open but global aphasia, did not follow  most commands except  Few midline and simple one-step commands Eyes left gaze deviation able to cross midline slightly, not blinking to visual threat on the right, blinking to threat on the left,  able to track on the left but not right. Mild lower right facial droop.  Tongue protrusion not cooperative. Spontaneous movement of LUE and LLE, LUE at least 4/5, no drift when lifted in air. LLE 3/5 with pain stimulation. RUE flaccid, right-sided neglect, RLE slight withdraw to pain. DTR  Discharge Diet  Dysphagia I honey thick liquids  DISCHARGE PLAN Disposition:  Transfer to [facility] for ongoing PT, OT and ST aspirin 81 mg daily and Brilinta (ticagrelor) 90 mg bid for secondary stroke prevention  Recommend ongoing stroke risk factor control by Primary Care Physician at time of discharge from inpatient rehabilitation. Follow-up PCP Celene Squibb, MD in 2 weeks following discharge from rehab. Follow-up in [facility] in 4 weeks following discharge from rehab, office to schedule an appointment.  Follow-up with Willaim Rayas [facility]) Estanislado Pandy, MD (Interventional Neuroradiologist) in 4 weeks following discharge from rehab, office to schedule an appointment.   35  minutes were spent preparing discharge.  Burnetta Sabin, MSN, APRN, ANVP-BC, AGPCNP-BC Advanced Practice Stroke Nurse [place] for Schedule & Pager information 04/16/2020 12:47 PM  I have personally obtained history,examined this patient, reviewed notes, independently viewed imaging studies, participated in medical decision making and plan of care.ROS completed by me personally and pertinent positives fully documented  I have made any additions or clarifications directly to the above note. Agree with note above.    Delia Heady, MD Medical Director [facility] Pager: [phone] 04/16/2020 2:31 PM

## 2020-04-17 ENCOUNTER — Inpatient Hospital Stay (HOSPITAL_COMMUNITY): Payer: BC Managed Care – PPO | Admitting: Physical Therapy

## 2020-04-17 ENCOUNTER — Inpatient Hospital Stay (HOSPITAL_COMMUNITY): Payer: BC Managed Care – PPO | Admitting: Speech Pathology

## 2020-04-17 ENCOUNTER — Inpatient Hospital Stay (HOSPITAL_COMMUNITY): Payer: BC Managed Care – PPO | Admitting: Occupational Therapy

## 2020-04-17 DIAGNOSIS — I63512 Cerebral infarction due to unspecified occlusion or stenosis of left middle cerebral artery: Secondary | ICD-10-CM

## 2020-04-17 LAB — GLUCOSE, CAPILLARY
Glucose-Capillary: 133 mg/dL — ABNORMAL HIGH (ref 70–99)
Glucose-Capillary: 203 mg/dL — ABNORMAL HIGH (ref 70–99)
Glucose-Capillary: 195 mg/dL — ABNORMAL HIGH (ref 70–99)
Glucose-Capillary: 190 mg/dL — ABNORMAL HIGH (ref 70–99)
Glucose-Capillary: 327 mg/dL — ABNORMAL HIGH (ref 70–99)

## 2020-04-17 LAB — COMPREHENSIVE METABOLIC PANEL
ALT: 50 U/L — ABNORMAL HIGH (ref 0–44)
AST: 32 U/L (ref 15–41)
Albumin: 2.6 g/dL — ABNORMAL LOW (ref 3.5–5.0)
Alkaline Phosphatase: 96 U/L (ref 38–126)
Anion gap: 9 (ref 5–15)
BUN: 19 mg/dL (ref 6–20)
CO2: 26 mmol/L (ref 22–32)
Calcium: 9.4 mg/dL (ref 8.9–10.3)
Chloride: 102 mmol/L (ref 98–111)
Creatinine, Ser: 0.87 mg/dL (ref 0.61–1.24)
GFR calc Af Amer: >60 mL/min (ref >60)
GFR calc non Af Amer: >60 mL/min (ref >60)
Glucose, Bld: 147 mg/dL — ABNORMAL HIGH (ref 70–99)
Potassium: 3.5 mmol/L (ref 3.5–5.1)
Sodium: 137 mmol/L (ref 135–145)
Total Bilirubin: 0.8 mg/dL (ref 0.3–1.2)
Total Protein: 6.6 g/dL (ref 6.5–8.1)

## 2020-04-17 LAB — CBC WITH DIFFERENTIAL/PLATELET
Abs Immature Granulocytes: 0.03 10*3/uL (ref 0.00–0.07)
Basophils Absolute: 0 10*3/uL (ref 0.0–0.1)
Basophils Relative: 1 %
Eosinophils Absolute: 0.2 10*3/uL (ref 0.0–0.5)
Eosinophils Relative: 4 %
HCT: 36.8 % — ABNORMAL LOW (ref 39.0–52.0)
Hemoglobin: 12.8 g/dL — ABNORMAL LOW (ref 13.0–17.0)
Immature Granulocytes: 0 %
Lymphocytes Relative: 23 %
Lymphs Abs: 1.5 10*3/uL (ref 0.7–4.0)
MCH: 31.2 pg (ref 26.0–34.0)
MCHC: 34.8 g/dL (ref 30.0–36.0)
MCV: 89.8 fL (ref 80.0–100.0)
Monocytes Absolute: 0.5 10*3/uL (ref 0.1–1.0)
Monocytes Relative: 8 %
Neutro Abs: 4.4 10*3/uL (ref 1.7–7.7)
Neutrophils Relative %: 64 %
Platelets: 196 10*3/uL (ref 150–400)
RBC: 4.1 MIL/uL — ABNORMAL LOW (ref 4.22–5.81)
RDW: 13.3 % (ref 11.5–15.5)
WBC: 6.7 10*3/uL (ref 4.0–10.5)
nRBC: 0 % (ref 0.0–0.2)

## 2020-04-17 MED ORDER — MUSCLE RUB 10-15 % EX CREA
TOPICAL_CREAM | Freq: Two times a day (BID) | CUTANEOUS | Status: DC | PRN
  Filled 2020-04-17 (×2): qty 85

## 2020-04-17 MED ORDER — ENOXAPARIN SODIUM 40 MG/0.4ML ~~LOC~~ SOLN
40.0000 mg | SUBCUTANEOUS | Status: DC
  Administered 2020-04-17 – 2020-05-13 (×27): 40 mg via SUBCUTANEOUS
  Filled 2020-04-17 (×27): qty 0.4

## 2020-04-17 MED ORDER — LOSARTAN POTASSIUM 50 MG PO TABS
25.0000 mg | ORAL_TABLET | Freq: Every day | ORAL | Status: DC
  Administered 2020-04-18: 25 mg via ORAL
  Filled 2020-04-17: qty 1

## 2020-04-17 NOTE — Plan of Care (Signed)
  Problem: Consults Goal: RH STROKE PATIENT EDUCATION Description: See Patient Education module for education specifics  Outcome: Progressing Goal: Nutrition Consult-if indicated Outcome: Progressing Goal: Diabetes Guidelines if Diabetic/Glucose > 140 Description: If diabetic or lab glucose is > 140 mg/dl - Initiate Diabetes/Hyperglycemia Guidelines & Document Interventions  Outcome: Progressing   Problem: RH BLADDER ELIMINATION Goal: RH STG MANAGE BLADDER WITH ASSISTANCE Description: STG Manage Bladder With min Assistance Outcome: Progressing   Problem: RH SAFETY Goal: RH STG ADHERE TO SAFETY PRECAUTIONS W/ASSISTANCE/DEVICE Description: STG Adhere to Safety Precautions With supervision Assistance/Device. Outcome: Progressing   Problem: RH COGNITION-NURSING Goal: RH STG USES MEMORY AIDS/STRATEGIES W/ASSIST TO PROBLEM SOLVE Description: STG Uses Memory Aids/Strategies With min Assistance to Problem Solve. Outcome: Progressing   Problem: RH KNOWLEDGE DEFICIT Goal: RH STG INCREASE KNOWLEDGE OF DIABETES Description: Pt/family will demonstrate understanding of DM management with min assist using handout/booklets in spanish and assistance with interrupter prior to DC Outcome: Progressing Goal: RH STG INCREASE KNOWLEDGE OF HYPERTENSION Description: Pt/family will demonstrate understanding of HTN management with min assist using handout/booklets in spanish and assistance with interrupter prior to DC Outcome: Progressing Goal: RH STG INCREASE KNOWLEDGE OF DYSPHAGIA/FLUID INTAKE Description: Pt/family will demonstrate understanding of dysphagia management with min assist using handout/booklets in spanish and assistance with interrupter prior to DC Outcome: Progressing Goal: RH STG INCREASE KNOWLEGDE OF HYPERLIPIDEMIA Description: Pt/family will demonstrate understanding of HLD management with min assist using handout/booklets in spanish and assistance with interrupter prior to DC Outcome:  Progressing Goal: RH STG INCREASE KNOWLEDGE OF STROKE PROPHYLAXIS Description: Pt/family will demonstrate understanding of stroke prevention with min assist using handout/booklets in spanish and assistance with interrupter prior to DC Outcome: Progressing   

## 2020-04-17 NOTE — Evaluation (Signed)
Physical Therapy Assessment and Plan  Patient Details  Name: Brad Delacruz MRN: [id number] Date of Birth: 02/01/1961  PT Diagnosis: Abnormal posture, Abnormality of gait, Coordination disorder, Hemiplegia dominant, Hypotonia, Impaired cognition, Impaired sensation and Muscle weakness Rehab Potential: Poor ELOS: 4 weeks   Today's Date: 04/17/2020 PT Individual Time: 1300-1410 PT Individual Time Calculation (min): 70 min    Problem List:  Patient Active Problem List   Diagnosis Date Noted  . SAH (subarachnoid hemorrhage) ([place]) 04/16/2020  . Cerebral edema ([place]) 04/16/2020  . Pneumonia 04/16/2020  . Hypertensive emergency 04/16/2020  . Hyperlipidemia 04/16/2020  . Diabetes mellitus type II, uncontrolled ([place]) 04/16/2020  . Dysphagia 04/16/2020  . Protein-calorie malnutrition ([place]) 04/16/2020  . Urinary retention 04/16/2020  . 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 Acute respiratory failure ([place])   . Acute ischemic stroke ([place]) L MCA d/t L M1 oclusion s/p MCA stent 03/24/2020  . CVA (cerebral vascular accident) ([place]) 03/24/2020  . Middle cerebral artery embolism, left 03/24/2020    Past Medical History:  Past Medical History:  Diagnosis Date  . Diabetes mellitus without complication [facility])    Past Surgical History:  Past Surgical History:  Procedure Laterality Date  . IR CT HEAD LTD  03/24/2020  . IR CT HEAD LTD  03/24/2020  . IR INTRA CRAN STENT  03/24/2020  . IR PATIENT EVAL TECH 0-60 MINS  03/25/2020  . IR PERCUTANEOUS ART THROMBECTOMY/INFUSION INTRACRANIAL INC DIAG ANGIO  03/24/2020  . RADIOLOGY WITH ANESTHESIA N/A 03/24/2020   Procedure: IR WITH ANESTHESIA;  Surgeon: Luanne Bras, MD;  Location: [facility];  Service: Radiology;  Laterality: N/A;    Assessment & Plan Clinical Impression: Patient is a 59 year old right-handed male history of diabetes mellitus and hypertension.  Per chart review lives with spouse independent prior to admission.  Presented  03/24/2020 with right-sided weakness and aphasia to [facility].  Admission chemistries with alcohol negative, sodium 130, potassium 3.3, glucose 309, hemoglobin A1c 9.2, urine drug screen positive benzos.  Cranial CT scan negative for acute changes.  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  CT cerebral perfusion scan as well as CT angiogram of head and neck showed a large left MCA territory nonhemorrhagic infarction as well as acute left M1 large vessel occlusion.  Large penumbra with mismatch volume of 99 mL.  Patient was transferred to [facility].  Patient underwent TPA followed by mechanical thrombectomy revascularization per interventional radiology.  Echocardiogram with ejection fraction of 88% grade 1 diastolic dysfunction.  TCD bubble study was positive for small PFO and lower extremity Dopplers negative for DVT.  Patient remained intubated through 04/01/2020.    Latest follow-up cranial CT scan 04/08/2020 shows evolving large left MCA territory infarct with similar mass-effect no new hemorrhage or hydrocephalus.  Maintained on low-dose aspirin as well as Brilinta for CVA prophylaxis.  Considerations to be made for TEE and loop recorder as outpatient.  Subcutaneous heparin for DVT prophylaxis.  Nasogastric tube placed for nutritional support changed to nocturnal and diet currently advanced to dysphagia #1 honey thick liquid and nasogastric tube has been removed.  He did complete a course of Ancef 03/31/2020 to 04/05/2020 for pneumonia.  Urine culture 04/14/2020 multiple species intravenous Rocephin discontinued. .  Patient transferred to CIR on 04/16/2020 .   Patient currently requires total with mobility secondary to muscle weakness, muscle joint tightness and muscle paralysis, decreased cardiorespiratoy endurance, impaired timing and sequencing, abnormal tone and unbalanced muscle activation, decreased  visual perceptual skills, decreased visual motor skills and  hemianopsia, decreased midline orientation, decreased attention to right, right side neglect, decreased motor planning and ideational apraxia, decreased attention, decreased awareness, decreased problem solving, decreased safety awareness, decreased memory and delayed processing and decreased sitting balance, decreased standing balance, decreased postural control, hemiplegia and decreased balance strategies.  Prior to hospitalization, patient was independent  with mobility and lived with Family in a House home.  Home access is 2-3 back stepsStairs to enter.  Patient will benefit from skilled PT intervention to maximize safe functional mobility, minimize fall risk and decrease caregiver burden for planned discharge home with 24 hour assist.  Anticipate patient will SNF at discharge.     Skilled Therapeutic Intervention  Pt received supine in bed and agreeable to PT. Supine>sit transfer; dependent as pt resisting transfer withpushers syndrom. Sitting EOB with total A. Squat pivot transfer to [facility] without assist from PT to L side and max-total cues for safety, setup, awareness, posture, and midline. PT instructed patient in PT Evaluation and initiated treatment intervention; see below for results. PT educated patient in [place], rehab potential, rehab goals, and discharge recommendations. In WC, total A for sitting balance to prevent R/anterior LOB and fall out of WC. Squat pivot and SB transfers performed without assist from PT to prevent pushing tendencies from limiting safety to and from mat table. PT obtained TIS WC, fit WC to allow pt to sit up with improved safety and improved pressure relief with hybrid cushion for sacral pressure relief. Pt returned to room and performed SB transfer to bed with total A + 2 for safety. Sit>supine completed with assist from pt on the R side, and left supine in bed with call bell in reach and all needs met.        PT  Evaluation Precautions/Restrictions Precautions Precautions: Fall Restrictions Weight Bearing Restrictions: No General   Vital Signs  Pain Pain Assessment Pain Scale: Faces Pain Score: 0-No pain Faces Pain Scale: Hurts even more Pain Type: Acute pain Pain Location: Shoulder Pain Orientation: Right Pain Descriptors / Indicators: Grimacing;Guarding Pain Onset: On-going Pain Intervention(s): RN made aware;Repositioned Home Living/Prior Functioning Home Living Living Arrangements: Spouse/significant other;Children Available Help at Discharge: Family;Available 24 hours/day Type of Home: House Home Access: Stairs to enter [employer] of Steps: 2-3 back steps Entrance Stairs-Rails: None Home Layout: One level Bathroom Shower/Tub: Chiropodist: Standard  Lives With: Family Prior Function Level of Independence: Independent with basic ADLs;Independent with homemaking with ambulation;Independent with gait;Independent with transfers  Able to Take Stairs?: Yes Driving: Yes Vocation: Full time employment Comments: Dealer Vision/Perception  Vision - Assessment Additional Comments: Pt closes L eyes throughout so maybe seeing double vision. Pt with difficulty visually attending to R side throughout session. When given vision assessment he could track to the R side with cuing but did not do so any other time during evaluation. Perception Perception: Impaired Inattention/Neglect: Does not attend to right visual field  Cognition Overall Cognitive Status: Impaired/Different from baseline Arousal/Alertness: Awake/alert Orientation Level: Oriented to person;Other (comment)(pt is mute) Immediate Memory Recall: (unable to answer due to aphasia) Sensation Sensation Light Touch: Impaired Detail Light Touch Impaired Details: Absent RUE;Absent RLE Hot/Cold: Not tested Proprioception: Impaired Detail Proprioception Impaired Details: Absent RUE;Absent  RLE Stereognosis: Not tested Coordination Gross Motor Movements are Fluid and Coordinated: No Fine Motor Movements are Fluid and Coordinated: No Coordination and Movement Description: dense R hemi Motor  Motor Motor: Hemiplegia;Motor perseverations;Motor impersistence;Abnormal postural alignment and control Motor -  Skilled Clinical Observations: dense RUE/RLE hemie  Mobility Bed Mobility Bed Mobility: Supine to Sit;Sit to Supine Supine to Sit: Dependent - Patient equal 0% Sit to Supine: Dependent - Patient equal 0% Transfers Transfers: Sit to Stand;Stand to Sit;Lateral/Scoot Transfers;Squat Pivot Transfers Sit to Stand: Dependent - Patient 0% Stand to Sit: Dependent - Patient equal 0% Squat Pivot Transfers: Dependent - Patient 0% Lateral/Scoot Transfers: Dependent - Patient 0% Transfer (Assistive device): None Locomotion  Gait Ambulation: No Gait Gait: No Stairs / Additional Locomotion Stairs: No Wheelchair Mobility Wheelchair Mobility: Yes Wheelchair Assistance: Dependent - Patient 0% Environmental health practitioner: Other (comment) Distance: 139f TIS WC  Trunk/Postural Assessment  Cervical Assessment Cervical Assessment: Exceptions to WFL(forward head) Thoracic Assessment Thoracic Assessment: Exceptions to WFL(kyphotic) Lumbar Assessment Lumbar Assessment: Exceptions to WFL(posterior pelvic tilt) Postural Control Postural Control: Deficits on evaluation  Balance Balance Balance Assessed: Yes Static Sitting Balance Static Sitting - Balance Support: Left upper extremity supported Static Sitting - Level of Assistance: 2: Max assist;1: +1 Total assist Dynamic Sitting Balance Dynamic Sitting - Balance Support: Feet supported Dynamic Sitting - Level of Assistance: 1: +1 Total assist Static Standing Balance Static Standing - Level of Assistance: 1: +2 Total assist Dynamic Standing Balance Dynamic Standing - Level of Assistance: 1: +2 Total assist Extremity Assessment       RLE Assessment General Strength Comments: 0/5 no active motor control LLE Assessment LLE Assessment: Exceptions to [facility] Strength Comments: grosslt 4-/5 with functional mobility assessment    Refer to Care Plan for Long Term Goals  Recommendations for other services: None   Discharge Criteria: Patient will be discharged from PT if patient refuses treatment 3 consecutive times without medical reason, if treatment goals not met, if there is a change in medical status, if patient makes no progress towards goals or if patient is discharged from hospital.  The above assessment, treatment plan, treatment alternatives and goals were discussed and mutually agreed upon: by patient and by family  ALorie Phenix5/14/2021, 1:03 PM

## 2020-04-17 NOTE — Progress Notes (Signed)
Inpatient Rehabilitation  Patient information reviewed and entered into eRehab system by [place] Andreoni M. Lakin Romer, M.A., CCC/SLP, PPS Coordinator.  Information including medical coding, functional ability and quality indicators will be reviewed and updated through discharge.    

## 2020-04-17 NOTE — IPOC Note (Addendum)
Overall Plan of Care [facility]) Patient Details Name: Brad Delacruz MRN: [id number] DOB: [birth year]  Admitting Diagnosis: Left middle cerebral artery stroke [facility])  Hospital Problems: Principal Problem:   Left middle cerebral artery stroke [facility])     Functional Problem List: Nursing Bladder, Bowel, Medication Management, Perception, Safety, Nutrition  PT Balance, Behavior, Edema, Endurance, Motor, Nutrition, Pain, Perception, Safety, Sensory, Skin Integrity  OT Balance, Behavior, Cognition, Endurance, Motor, Pain, Perception, Safety, Sensory, Vision  SLP Cognition, Behavior, Linguistic  TR         Basic ADL's: OT Grooming, Bathing, Dressing, Toileting     Advanced  ADL's: OT       Transfers: PT Bed Mobility, Bed to Chair, Car, [employer], Civil Service fast streamer, Research scientist (life sciences): PT Ambulation, Psychologist, prison and probation services, Stairs     Additional Impairments: OT Fuctional Use of Upper Extremity  SLP Swallowing, Communication, Social Cognition comprehension, expression Social Interaction, Memory, Attention, Awareness, Problem Solving  TR      Anticipated Outcomes Item Anticipated Outcome  Self Feeding set up A  Swallowing  Supervision   Basic self-care  S -min A  Toileting  min A   Bathroom Transfers min A  Bowel/Bladder  cont x 2, min assist  Transfers  mod A lateral scoot or SB transfers  Locomotion  min assist WC mobility. non-ambulatory  Communication  Mod A  Cognition  Min A  Pain  less than 3   Safety/Judgment  supervision assist   Therapy Plan: PT Intensity: Minimum of 1-2 x/day ,45 to 90 minutes PT Frequency: 5 out of 7 days PT Duration Estimated Length of Stay: 4 weeks OT Intensity: Minimum of 1-2 x/day, 45 to 90 minutes OT Frequency: 5 out of 7 days OT Duration/Estimated Length of Stay: 3-4 wks SLP Intensity: Minumum of 1-2 x/day, 30 to 90 minutes SLP Frequency: 3 to 5 out of 7 days SLP Duration/Estimated Length of Stay: 3-4 weeks   Due to  the current state of emergency, patients may not be receiving their 3-hours of Medicare-mandated therapy.   Team Interventions: Nursing Interventions Patient/Family Education, Bladder Management, Bowel Management, Disease Management/Prevention, Medication Management, Cognitive Remediation/Compensation, Dysphagia/Aspiration Precaution Training, Discharge Planning  PT interventions Ambulation/gait training, Community reintegration, DME/adaptive equipment instruction, Neuromuscular re-education, Psychosocial support, Stair training, UE/LE Strength taining/ROM, Wheelchair propulsion/positioning, UE/LE Coordination activities, Therapeutic Activities, Skin care/wound management, Pain management, Functional electrical stimulation, Discharge planning, Balance/vestibular training, Cognitive remediation/compensation, Disease management/prevention, Functional mobility training, Patient/family education, Splinting/orthotics, Therapeutic Exercise, Visual/perceptual remediation/compensation  OT Interventions Warden/ranger, DME/adaptive equipment instruction, Patient/family education, Therapeutic Activities, Wheelchair propulsion/positioning, Cognitive remediation/compensation, Functional electrical stimulation, Psychosocial support, Therapeutic Exercise, Community reintegration, Functional mobility training, Self Care/advanced ADL retraining, UE/LE Strength taining/ROM, Discharge planning, Neuromuscular re-education, UE/LE Coordination activities, Disease mangement/prevention, Pain management, Visual/perceptual remediation/compensation  SLP Interventions Cognitive remediation/compensation, Dysphagia/aspiration precaution training, Internal/external aids, Speech/Language facilitation, Therapeutic Activities, Environmental controls, Cueing hierarchy, Functional tasks, Patient/family education  TR Interventions    SW/CM Interventions Discharge Planning, Psychosocial Support, Patient/Family Education    Barriers to Discharge MD  Medical stability and Global aphasia  Nursing      PT Inaccessible home environment, Decreased caregiver support, Medical stability, Incontinence, Lack of/limited family support, Insurance for SNF coverage, Medication compliance, Behavior    OT      SLP      SW       Team Discharge Planning: Destination: PT-Skilled Nursing Facility (SNF) ,OT- Home , SLP-Home Projected Follow-up: PT-Skilled nursing facility, OT-  Home health OT, 24 hour supervision/assistance, SLP-Home  Health SLP, 24 hour supervision/assistance Projected Equipment Needs: PT-To be determined, Wheelchair cushion (measurements), Wheelchair (measurements), OT- To be determined, SLP-To be determined Equipment Details: PT- , OT-  Patient/family involved in discharge planning: PT- Patient, Family member/caregiver,  OT-Patient, Family member/caregiver, SLP-Family member/caregiver  MD ELOS: 21-25d Medical Rehab Prognosis:  Fair Assessment:  59 year old right-handed male history of diabetes mellitus and hypertension.  Per chart review lives with spouse independent prior to admission.  Presented 03/24/2020 with right-sided weakness and aphasia to [facility].  Admission chemistries with alcohol negative, sodium 130, potassium 3.3, glucose 309, hemoglobin A1c 9.2, urine drug screen positive benzos.  Cranial CT scan negative for acute changes.  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  CT cerebral perfusion scan as well as CT angiogram of head and neck showed a large left MCA territory nonhemorrhagic infarction as well as acute left M1 large vessel occlusion.  Large penumbra with mismatch volume of 99 mL.  Patient was transferred to [facility].  Patient underwent TPA followed by mechanical thrombectomy revascularization per interventional radiology.  Echocardiogram with ejection fraction of 26% grade 1 diastolic dysfunction.  TCD bubble study was positive for small PFO and  lower extremity Dopplers negative for DVT.  Patient remained intubated through 04/01/2020.    Latest follow-up cranial CT scan 04/08/2020 shows evolving large left MCA territory infarct with similar mass-effect no new hemorrhage or hydrocephalus.  Maintained on low-dose aspirin as well as Brilinta for CVA prophylaxis.  Considerations to be made for TEE and loop recorder as outpatient.  Subcutaneous heparin for DVT prophylaxis.  Nasogastric tube placed for nutritional support changed to nocturnal and diet currently advanced to dysphagia #1 honey thick liquid and nasogastric tube has been removed.  He did complete a course of Ancef 03/31/2020 to 04/05/2020 for pneumonia.  Urine culture 04/14/2020 multiple species intravenous Rocephin discontinued.     Now requiring 24/7 Rehab RN,MD, as well as CIR level PT, OT and SLP.    See Team Conference Notes for weekly updates to the plan of care

## 2020-04-17 NOTE — Evaluation (Signed)
Speech Language Pathology Assessment and Plan  Patient Details  Name: Brad Delacruz MRN: [id number] Date of Birth: 22-Jun-1961  SLP Diagnosis: Cognitive Impairments;Apraxia;Aphasia;Dysphagia  Rehab Potential: Excellent ELOS: 3-4 weeks    Today's Date: 04/17/2020 SLP Individual Time: 0900-1000 SLP Individual Time Calculation (min): 60 min   Problem List:  Patient Active Problem List   Diagnosis Date Noted  . SAH (subarachnoid hemorrhage) ([place]) 04/16/2020  . Cerebral edema ([place]) 04/16/2020  . Pneumonia 04/16/2020  . Hypertensive emergency 04/16/2020  . Hyperlipidemia 04/16/2020  . Diabetes mellitus type II, uncontrolled ([place]) 04/16/2020  . Dysphagia 04/16/2020  . Protein-calorie malnutrition ([place]) 04/16/2020  . Urinary retention 04/16/2020  . Left middle cerebral artery stroke ([place]) 04/16/2020  . Acute respiratory failure ([place])   . Acute ischemic stroke ([place]) L MCA d/t L M1 oclusion s/p MCA stent 03/24/2020  . CVA (cerebral vascular accident) ([place]) 03/24/2020  . Middle cerebral artery embolism, left 03/24/2020   Past Medical History:  Past Medical History:  Diagnosis Date  . Diabetes mellitus without complication [facility])    Past Surgical History:  Past Surgical History:  Procedure Laterality Date  . IR CT HEAD LTD  03/24/2020  . IR CT HEAD LTD  03/24/2020  . IR INTRA CRAN STENT  03/24/2020  . IR PATIENT EVAL TECH 0-60 MINS  03/25/2020  . IR PERCUTANEOUS ART THROMBECTOMY/INFUSION INTRACRANIAL INC DIAG ANGIO  03/24/2020  . RADIOLOGY WITH ANESTHESIA N/A 03/24/2020   Procedure: IR WITH ANESTHESIA;  Surgeon: Luanne Bras, MD;  Location: [place];  Service: Radiology;  Laterality: N/A;    Assessment / Plan / Recommendation Clinical Impression Patient is a 59 year old right-handed male history of diabetes mellitus and hypertension.  Per chart review lives with spouse independent prior to admission.  Presented 03/24/2020 with right-sided weakness and aphasia to [facility]. Cranial CT scan negative for acute changes. 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 CT cerebral perfusion scan as well as CT angiogram of head and neck showed a large left MCA territory nonhemorrhagic infarction as well as acute left M1 large vessel occlusion.  Large penumbra with mismatch volume of 99 mL.  Patient was transferred to [facility].  Patient underwent TPA followed by mechanical thrombectomy revascularization per interventional radiology. Patient remained intubated through 04/01/2020.    Latest follow-up cranial CT scan 04/08/2020 shows evolving large left MCA territory infarct with similar mass-effect no new hemorrhage or hydrocephalus.  Advanced to dysphagia 1 textures with honey thick liquids and nasogastric tube has been removed.  Therapy evaluations completed and patient was admitted for a comprehensive rehab program 04/16/20.  Patient demonstrates global aphasia characterized by deficits in both receptive and expressive language. Patient's receptive deficits are characterized by decreased yes/no accuracy and decreased ability to follow 1-step commands. Patient is nonverbal and intermittently uses gestures to communicate basic wants/needs and answer yes/no questions. 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 Patient also demonstrates moderate cognitive impairments impacting sustained attention and attention to the right field of environment with suspected visual deficits as well impacting his overall function.  Patient was consuming his breakfast upon arrival. Patient consumed Dys. 1 textures and honey-thick liquids via tsp with prolonged AP transit and a suspected delay swallow initiation. Intermittent use of multiple swallows were also utilized with Dys. 1 textures. Due to right labial weakness, patient had mild right anterior spillage and residue.  No overt s/s of aspiration were noted. Recommend patient  continue current diet  and would will need a repeat MBS prior to upgrade. Patient's wife verbalized and demonstrated safe swallowing strategies, therefore, she was signed off to provide supervision with meals.   Patient would benefit from skilled SLP intervention to maximize his swallowing and cognitive functioning as wall as his overall functional communication prior to discharge.     Skilled Therapeutic Interventions          Administered a cognitive-linguistic evaluation and BSE, please see above for details.   SLP Assessment  Patient will need skilled Speech Lanaguage Pathology Services during CIR admission    Recommendations  SLP Diet Recommendations: Dysphagia 1 (Puree);Honey Liquid Administration via: Spoon Medication Administration: Crushed with puree Supervision: Patient able to self feed;Staff to assist with self feeding;Full supervision/cueing for compensatory strategies Compensations: Slow rate;Small sips/bites;Minimize environmental distractions;Monitor for anterior loss Postural Changes and/or Swallow Maneuvers: Seated upright 90 degrees Oral Care Recommendations: Oral care BID Patient destination: Home Follow up Recommendations: Home Health SLP;24 hour supervision/assistance Equipment Recommended: To be determined    SLP Frequency 3 to 5 out of 7 days   SLP Duration  SLP Intensity  SLP Treatment/Interventions 3-4 weeks  Minumum of 1-2 x/day, 30 to 90 minutes  Cognitive remediation/compensation;Dysphagia/aspiration precaution training;Internal/external aids;Speech/Language facilitation;Therapeutic Activities;Environmental controls;Cueing hierarchy;Functional tasks;Patient/family education    Pain Pain Assessment Pain Scale: Faces Faces Pain Scale: Hurts even more Pain Type: Acute pain Pain Location: Shoulder Pain Orientation: Right Pain Descriptors / Indicators: Grimacing;Guarding Pain Onset: On-going Pain Intervention(s): RN made aware;Repositioned  Prior  Functioning Type of Home: House  Lives With: Family Available Help at Discharge: Family;Available 24 hours/day Vocation: Full time employment  SLP Evaluation Cognition Overall Cognitive Status: Impaired/Different from baseline Arousal/Alertness: Lethargic Orientation Level: Oriented to person Attention: Sustained Sustained Attention: Impaired Sustained Attention Impairment: Functional basic Immediate Memory Recall: (unable to answer due to aphasia) Safety/Judgment: Impaired  Comprehension Auditory Comprehension Overall Auditory Comprehension: Impaired Yes/No Questions: Impaired Basic Biographical Questions: 76-100% accurate Commands: Impaired One Step Basic Commands: 25-49% accurate Conversation: Simple Interfering Components: Processing speed;Attention;Visual impairments;Motor planning Visual Recognition/Discrimination Discrimination: Not tested Reading Comprehension Reading Status: Not tested Expression Expression Primary Mode of Expression: Verbal Verbal Expression Overall Verbal Expression: Impaired Initiation: Impaired Written Expression Dominant Hand: Right Written Expression: Unable to assess (comment) Oral Motor Oral Motor/Sensory Function Overall Oral Motor/Sensory Function: (difficult to assess due to apraxia) Motor Speech Motor Planning: Impaired  Bedside Swallowing Assessment General Date of Onset: 03/24/20 Previous Swallow Assessment: MBS 5/5: Recommended Dys. 1 textures with honey-thick liquids via tsp Diet Prior to this Study: Dysphagia 1 (puree);Honey-thick liquids Temperature Spikes Noted: No Respiratory Status: Room air History of Recent Intubation: Yes Date extubated: 05/01/20 Behavior/Cognition: Cooperative;Pleasant mood;Requires cueing;Lethargic/Drowsy Oral Cavity - Dentition: Adequate natural dentition Self-Feeding Abilities: Able to feed self;Needs assist Patient Positioning: Upright in bed Baseline Vocal Quality: Not  observed Volitional Cough: Cognitively unable to elicit Volitional Swallow: Able to elicit  Ice Chips Ice chips: Not tested Thin Liquid Thin Liquid: Not tested Nectar Thick Nectar Thick Liquid: Not tested Honey Thick Honey Thick Liquid: Impaired Presentation: Spoon Oral Phase Impairments: Reduced labial seal Oral Phase Functional Implications: Prolonged oral transit;Right anterior spillage Pharyngeal Phase Impairments: Suspected delayed Swallow Puree Puree: Impaired Presentation: Self Fed;Spoon Oral Phase Impairments: Reduced labial seal Oral Phase Functional Implications: Right anterior spillage;Prolonged oral transit Pharyngeal Phase Impairments: Multiple swallows;Suspected delayed Swallow Solid Not Tested BSE Assessment Risk for Aspiration Impact on safety and function: Moderate aspiration risk Other Related Risk Factors: Lethargy;Cognitive impairment  Short Term Goals: Week 1: SLP Short  Term Goal 1 (Week 1): Patient will demonstrate sustained attention to functional tasks for 15 minutes with Mod verbal cues for redirection. SLP Short Term Goal 2 (Week 1): Patient will initaite tasks in 75% of opportunities with Min verbal cues. SLP Short Term Goal 3 (Week 1): Patient will vocalize on command in 25% of opportunites with Max A multimodal cues. SLP Short Term Goal 4 (Week 1): Patient will utilize gestures to answer basic yes/no questions in 75% of opportunities with Mod A verbal and visual cues. SLP Short Term Goal 5 (Week 1): Patient will consume current diet with minimal overt s/s of aspiration and Min verbal cues for use of swallowing compensatory strategies. SLP Short Term Goal 6 (Week 1): Patient will consume trials of ice chips with minimal overt s/s of aspiration over 2 sessions and Min verbal cues to assess readiness for repeat MBS.  Refer to Care Plan for Long Term Goals  Recommendations for other services: None   Discharge Criteria: Patient will be discharged from  SLP if patient refuses treatment 3 consecutive times without medical reason, if treatment goals not met, if there is a change in medical status, if patient makes no progress towards goals or if patient is discharged from hospital.  The above assessment, treatment plan, treatment alternatives and goals were discussed and mutually agreed upon: by family  Armando Bukhari 04/17/2020, 4:08 PM

## 2020-04-17 NOTE — Progress Notes (Signed)
Inpatient Rehabilitation Care Coordinator Assessment and Plan Inpatient Rehabilitation Care Coordinator Assessment and Plan  Patient Details  Name: Brad Delacruz MRN: [id number] Date of Birth: 1961/07/30  Today's Date: 04/17/2020  Problem List:  Patient Active Problem List   Diagnosis Date Noted  . SAH (subarachnoid hemorrhage) ([facility]) 04/16/2020  . Cerebral edema ([facility]) 04/16/2020  . Pneumonia 04/16/2020  . Hypertensive emergency 04/16/2020  . Hyperlipidemia 04/16/2020  . Diabetes mellitus type II, uncontrolled ([facility]) 04/16/2020  . Dysphagia 04/16/2020  . Protein-calorie malnutrition ([facility]) 04/16/2020  . Urinary retention 04/16/2020  . 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 Acute respiratory failure ([facility])   . Acute ischemic stroke ([facility]) L MCA d/t L M1 oclusion s/p MCA stent 03/24/2020  . CVA (cerebral vascular accident) ([facility]) 03/24/2020  . Middle cerebral artery embolism, left 03/24/2020   Past Medical History:  Past Medical History:  Diagnosis Date  . Diabetes mellitus without complication [facility])    Past Surgical History:  Past Surgical History:  Procedure Laterality Date  . IR CT HEAD LTD  03/24/2020  . IR CT HEAD LTD  03/24/2020  . IR INTRA CRAN STENT  03/24/2020  . IR PATIENT EVAL TECH 0-60 MINS  03/25/2020  . IR PERCUTANEOUS ART THROMBECTOMY/INFUSION INTRACRANIAL INC DIAG ANGIO  03/24/2020  . RADIOLOGY WITH ANESTHESIA N/A 03/24/2020   Procedure: IR WITH ANESTHESIA;  Surgeon: Julieanne Cotton, MD;  Location: [facility] OR;  Service: Radiology;  Laterality: N/A;   Social History:  reports that he has never smoked. He has never used smokeless tobacco. He reports previous alcohol use. He reports that he does not use drugs.  Family / Support Systems Other Supports: 5 children: 37, 35, 33,31, and 18 Anticipated Caregiver: Yes Ability/Limitations of Caregiver: no Caregiver Availability: 24/7  Social History Preferred language: Spanish Religion:  Education:  Elementary Read: Yes Write: Yes Employment Status: Employed   Abuse/Neglect Abuse/Neglect Assessment Can Be Completed: Yes Physical Abuse: Denies Verbal Abuse: Denies Sexual Abuse: Denies Exploitation of patient/patient's resources: Denies Self-Neglect: Denies  Emotional Status Pt's affect, behavior and adjustment status: no Recent Psychosocial Issues: no Psychiatric History: no Substance Abuse History: no  Patient / Family Perceptions, Expectations & Goals Pt/Family understanding of illness & functional limitations: yes Pt/family expectations/goals: Goal to discharge back some  Proofreader available at discharge: yes daughter will transport  Discharge Planning Living Arrangements: Spouse/significant other, Children Support Systems: Spouse/significant other, Children Type of Residence: Private residence(Has ramp) Insurance Resources: Media planner (specify)(BCBS) Living Expenses: Own Does the patient have any problems obtaining your medications?: No Care Coordinator Anticipated Follow Up Needs: HH/OP DC Planning Additional Notes/Comments: Sw not aware of any barriers currently. Expected length of stay: 3-4 [place]  Clinical Impression SW entered room. Interpretor present. Spouse at bedside feeding patient. Sw introduced self, explained role and process. Addressed questions and concerns. Pleasant. Pt not veteran  [name] 04/17/2020, 10:36 AM

## 2020-04-17 NOTE — Progress Notes (Signed)
[place] PHYSICAL MEDICINE & REHABILITATION PROGRESS NOTE   Subjective/Complaints: Family interpreting  But pt unable to communicate in spanish or [place] Fam,ily concenred about "abd pain" Pt non verbal due to Aphasia   ROS- cannot obtain , aphasia   Objective:   DG Abd 1 View  Result Date: 04/15/2020 CLINICAL DATA:  Abdominal pain x1 day. EXAM: ABDOMEN - 1 VIEW COMPARISON:  Apr 10, 2020 FINDINGS: A nasogastric tube is seen with its distal tip overlying the expected region of the duodenal bulb. The bowel gas pattern is normal. No radio-opaque calculi or other significant radiographic abnormality are seen. IMPRESSION: Nasogastric tube positioning, as described above. Electronically Signed   By: Aram Candela M.D.   On: 04/15/2020 19:33   Recent Labs    04/16/20 0410 04/17/20 0613  WBC 7.5 6.7  HGB 12.6* 12.8*  HCT 36.9* 36.8*  PLT 198 196   Recent Labs    04/16/20 0410 04/17/20 0613  NA 137 137  K 4.1 3.5  CL 101 102  CO2 27 26  GLUCOSE 206* 147*  BUN 18 19  CREATININE 0.93 0.87  CALCIUM 9.4 9.4    Intake/Output Summary (Last 24 hours) at 04/17/2020 0809 Last data filed at 04/16/2020 1859 Gross per 24 hour  Intake 20 ml  Output -  Net 20 ml     Physical Exam: Vital Signs Blood pressure 110/71, pulse 79, temperature 98 F (36.7 C), resp. rate 18, SpO2 95 %.  General: No acute distress Mood and affect are appropriate Heart: Regular rate and rhythm no rubs murmurs or extra sounds Lungs: Clear to auscultation, breathing unlabored, no rales or wheezes Abdomen: Positive bowel sounds, soft nontender to palpation, nondistended Extremities: No clubbing, cyanosis, or edema Skin: No evidence of breakdown, no evidence of rash Neurologic: Cranial nerves II through XII intact, motor strength is 5/5 in L nd 0/5 RIght deltoid, bicep, tricep, grip, hip flexor, knee extensors, ankle dorsiflexor and plantar flexor Sensory exam cannot assess  Musculoskeletal: pain  with  Left shoulder ROM, winces with ROM, Mild wincing with R Hip ROM      Assessment/Plan: 1. Functional deficits secondary to Left MCA with R HP and Aphasia  which require 3+ hours per day of interdisciplinary therapy in a comprehensive inpatient rehab setting.  Physiatrist is providing close team supervision and 24 hour management of active medical problems listed below.  Physiatrist and rehab team continue to assess barriers to discharge/monitor patient progress toward functional and medical goals  Care Tool:  Bathing              Bathing assist       Upper Body Dressing/Undressing Upper body dressing        Upper body assist      Lower Body Dressing/Undressing Lower body dressing            Lower body assist       Toileting Toileting    Toileting assist Assist for toileting: Total Assistance - Patient < 25%     Transfers Chair/bed transfer  Transfers assist           Locomotion Ambulation   Ambulation assist              Walk 10 feet activity   Assist           Walk 50 feet activity   Assist           Walk 150 feet activity   Assist  Walk 10 feet on uneven surface  activity   Assist           Wheelchair     Assist               Wheelchair 50 feet with 2 turns activity    Assist            Wheelchair 150 feet activity     Assist          Blood pressure 110/71, pulse 79, temperature 98 F (36.7 C), resp. rate 18, SpO2 95 %.  Medical Problem List and Plan: 1.  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  TEE and loop recorder placement to be discussed as outpatient.             -patient may shower             -ELOS/Goals: 20-22 days MinA PT, OT, SLP CIR evals today  2.  Antithrombotics: -DVT/anticoagulation: Subcutaneous heparin.  Venous Doppler studies  negative             -antiplatelet therapy: Brilinta 90 mg twice daily, aspirin 81 mg daily 3. Pain Management: Tylenol as needed. Well controlled.  4. Mood: Amantadine 100 mg twice daily             -antipsychotic agents: N/A 5. Neuropsych: This patient is not capable of making decisions on his own behalf. 6. Skin/Wound Care: Routine skin checks 7. Fluids/Electrolytes/Nutrition: Routine in and outs with follow-up chemistries 8.  Dysphagia.  Dysphagia #1 honey thick liquids.   Dietary follow-up as well as speech therapy 9.  Diabetes mellitus.  Hemoglobin A1c 9.2.  NovoLog 5 units 2 times daily, Lantus insulin 35 units twice daily. Uncontrolled. Increase Lantus to 36U.  CBG (last 3)  Recent Labs    04/16/20 1703 04/16/20 2129 04/17/20 0615  GLUCAP 269* 188* 133*  some lability , monitor on increase lantus 10.  Hypertension.  Cozaar 50 mg daily. Well controlled Vitals:   04/16/20 2024 04/17/20 0419  BP: 94/69 110/71  Pulse: 98 79  Resp: 20 18  Temp: 98.4 F (36.9 C) 98 F (36.7 C)  SpO2: 98% 95%  running on low side reduce Cozaar to 25mg   11.  Hyperlipidemia.  Lipitor 12.  Obesity.  BMI 29.50.  Dietary follow-up  13.  Post stroke shoulder pain - discussed with OT, add Kpad and aspercremetylenol for pain , no imaging needed at this time no trauma   LOS: 1 days A FACE TO FACE EVALUATION WAS PERFORMED  Charlett Blake 04/17/2020, 8:09 AM

## 2020-04-17 NOTE — Evaluation (Signed)
Occupational Therapy Assessment and Plan  Patient Details  Name: Brad Delacruz MRN: [id number] Date of Birth: [birth year]  OT Diagnosis: abnormal posture, cognitive deficits, disturbance of vision, hemiplegia affecting dominant side, muscle weakness (generalized) and coordination disorder Rehab Potential: Rehab Potential (ACUTE ONLY): Fair ELOS: 3-4 wks   Today's Date: 04/17/2020 OT Individual Time: 0800-0900 OT Individual Time Calculation (min): 60 min     Problem List:  Patient Active Problem List   Diagnosis Date Noted  . SAH (subarachnoid hemorrhage) ([place]) 04/16/2020  . Cerebral edema ([place]) 04/16/2020  . Pneumonia 04/16/2020  . Hypertensive emergency 04/16/2020  . Hyperlipidemia 04/16/2020  . Diabetes mellitus type II, uncontrolled ([place]) 04/16/2020  . Dysphagia 04/16/2020  . Protein-calorie malnutrition ([place]) 04/16/2020  . Urinary retention 04/16/2020  . 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 Acute respiratory failure ([place])   . Acute ischemic stroke ([place]) L MCA d/t L M1 oclusion s/p MCA stent 03/24/2020  . CVA (cerebral vascular accident) ([place]) 03/24/2020  . Middle cerebral artery embolism, left 03/24/2020    Past Medical History:  Past Medical History:  Diagnosis Date  . Diabetes mellitus without complication [facility])    Past Surgical History:  Past Surgical History:  Procedure Laterality Date  . IR CT HEAD LTD  03/24/2020  . IR CT HEAD LTD  03/24/2020  . IR INTRA CRAN STENT  03/24/2020  . IR PATIENT EVAL TECH 0-60 MINS  03/25/2020  . IR PERCUTANEOUS ART THROMBECTOMY/INFUSION INTRACRANIAL INC DIAG ANGIO  03/24/2020  . RADIOLOGY WITH ANESTHESIA N/A 03/24/2020   Procedure: IR WITH ANESTHESIA;  Surgeon: Luanne Bras, MD;  Location: [place];  Service: Radiology;  Laterality: N/A;    Assessment & Plan Clinical Impression: Patient is a 59 y.o. year old male with history of diabetes mellitus and hypertension.  Per chart review lives with spouse independent  prior to admission.  Presented 03/24/2020 with right-sided weakness and aphasia to [facility].  Admission chemistries with alcohol negative, sodium 130, potassium 3.3, glucose 309, hemoglobin A1c 9.2, urine drug screen positive benzos.  Cranial CT scan negative for acute changes.  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  CT cerebral perfusion scan as well as CT angiogram of head and neck showed a large left MCA territory nonhemorrhagic infarction as well as acute left M1 large vessel occlusion.  Large penumbra with mismatch volume of 99 mL.  Patient was [facility] to [facility].  Patient underwent TPA followed by mechanical thrombectomy revascularization per interventional radiology.  Echocardiogram with ejection fraction of 40% grade 1 diastolic dysfunction.  TCD bubble study was positive for small PFO and lower extremity Dopplers negative for DVT.  Patient remained intubated through 04/01/2020.    Latest follow-up cranial CT scan 04/08/2020 shows evolving large left MCA territory infarct with similar mass-effect no new hemorrhage or hydrocephalus.  Maintained on low-dose aspirin as well as Brilinta for CVA prophylaxis.  Considerations to be made for TEE and loop recorder as outpatient.  Subcutaneous heparin for DVT prophylaxis.  Nasogastric tube placed for nutritional support changed to nocturnal and diet currently advanced to dysphagia #1 honey thick liquid and nasogastric tube has been removed.  He did complete a course of Ancef 03/31/2020 to 04/05/2020 for pneumonia.  Urine culture 04/14/2020 multiple species intravenous Rocephin discontinued.  .  Patient transferred to CIR on 04/16/2020 .    Patient currently requires total with basic self-care skills secondary to muscle weakness, decreased cardiorespiratoy endurance, abnormal tone, decreased coordination and  decreased motor planning, R neglect, decreased midline orientation and decreased attention to right, decreased  initiation, decreased attention, decreased awareness, decreased problem solving, decreased safety awareness, decreased memory and delayed processing and decreased sitting balance, decreased standing balance, decreased postural control, hemiplegia and decreased balance strategies.  Prior to hospitalization, patient could complete ADLs and IADLs with independent .  Patient will benefit from skilled intervention to decrease level of assist with basic self-care skills prior to discharge home with care partner.  Anticipate patient will require 24 hour supervision and minimal physical assistance and follow up home health.  OT - End of Session Activity Tolerance: Decreased this session Endurance Deficit: Yes Endurance Deficit Description: Pt fatigues quickly- multiple rest breaks needed from fatigue OT Assessment Rehab Potential (ACUTE ONLY): Fair OT Patient demonstrates impairments in the following area(s): Balance;Behavior;Cognition;Endurance;Motor;Pain;Perception;Safety;Sensory;Vision OT Basic ADL's Functional Problem(s): Grooming;Bathing;Dressing;Toileting OT Transfers Functional Problem(s): Toilet;Tub/Shower OT Additional Impairment(s): Fuctional Use of Upper Extremity OT Plan OT Intensity: Minimum of 1-2 x/day, 45 to 90 minutes OT Frequency: 5 out of 7 days OT Duration/Estimated Length of Stay: 3-4 wks OT Treatment/Interventions: Balance/vestibular training;DME/adaptive equipment instruction;Patient/family education;Therapeutic Activities;Wheelchair propulsion/positioning;Cognitive remediation/compensation;Functional electrical stimulation;Psychosocial support;Therapeutic Exercise;Community reintegration;Functional mobility training;Self Care/advanced ADL retraining;UE/LE Strength taining/ROM;Discharge planning;Neuromuscular re-education;UE/LE Coordination activities;Disease mangement/prevention;Pain management;Visual/perceptual remediation/compensation OT Self Feeding Anticipated Outcome(s): set  up A OT Basic Self-Care Anticipated Outcome(s): S -min A OT Toileting Anticipated Outcome(s): min A OT Bathroom Transfers Anticipated Outcome(s): min A OT Recommendation Recommendations for Other Services: Neuropsych consult Patient destination: Home Follow Up Recommendations: Home health OT;24 hour supervision/assistance Equipment Recommended: To be determined   Skilled Therapeutic Intervention Upon entering the room, pt supine in bed with daughter and wife present in the room. Interpreter also joining the session. OT educated pt and caregiver on OT purpose, POC, and goals with them verbalizing understanding. Pt following simple 1 step commands 25% of the session. Pt appears both externally and internally distracted during the session. Pt needing total A with max multimodal cuing to get to EOB. Pt's balance being managed with total A of EOB with frequent cuing and placement of B UEs secondary to pt pushing to the R once seated on EOB. Therapist unable to scoot pt laterally up bed as he was unable to follow commands given. Pt standing from EOB with total A and R knee blocked and buckling while standing for ~ 1 minute. Pt fatigues very quickly . Sit >supine with total A and repositioning in bed with use of trendelenburg feature. OT answering questions family had regarding IP process and expectations as well as pt's current multiple deficits. OT asking caregivers to sit on R side in order to encourage pt to visually scan when they are in the room. Bed alarm activated and call bell within reach upon exiting the room.   OT Evaluation Precautions/Restrictions  Precautions Precautions: Fall Restrictions Weight Bearing Restrictions: No Pain Pain Assessment Pain Scale: Faces Pain Score: 0-No pain Faces Pain Scale: Hurts even more Pain Type: Acute pain Pain Location: Shoulder Pain Orientation: Right Pain Descriptors / Indicators: Grimacing;Guarding Pain Onset: On-going Pain Intervention(s): RN  made aware;Repositioned Home Living/Prior Functioning Home Living Family/patient expects to be discharged to:: Private residence Living Arrangements: Spouse/significant other, Children Available Help at Discharge: Family, Available 24 hours/day Type of Home: House Home Access: Stairs to enter [employer] of Steps: 2-3 back steps Entrance Stairs-Rails: None Home Layout: One level Bathroom Shower/Tub: Chiropodist: Standard  Lives With: Family IADL History 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 aphasic) Vision Assessment?: Vision impaired- to be further tested in functional context Additional Comments: Pt closes L eyes throughout so maybe seeing double vision. Pt with difficulty visually attending to R side throughout session. When given vision assessment he could track to the R side with cuing but did not do so any other time during evaluation. Perception  Perception: Impaired Inattention/Neglect: Does not attend to right visual field Cognition Overall Cognitive Status: Impaired/Different from baseline Arousal/Alertness: Awake/alert Orientation Level: Nonverbal/unable to assess Year: Other (Comment)(aphasic) Month: (aphasic) Day of Week: Other (Comment)(aphasic) Immediate Memory Recall: (unable to answer due to aphasia) Sensation Sensation Light Touch: Impaired Detail Light Touch Impaired Details: Absent RUE;Absent RLE Hot/Cold: Not tested Proprioception: Impaired Detail Proprioception Impaired Details: Absent RUE;Absent RLE Stereognosis: Not  tested Coordination Gross Motor Movements are Fluid and Coordinated: No Fine Motor Movements are Fluid and Coordinated: No Coordination and Movement Description: dense R hemi Motor  Motor Motor: Hemiplegia Mobility  Bed Mobility Bed Mobility: Supine to Sit;Sit to Supine Supine to Sit: Dependent - Patient equal 0% Sit to Supine: Dependent - Patient equal 0% Transfers Sit to Stand: Dependent - Patient 0% Stand to Sit: Dependent - Patient equal 0%  Trunk/Postural Assessment  Cervical Assessment Cervical Assessment: Exceptions to WFL(forward head) Thoracic Assessment Thoracic Assessment: Exceptions to WFL(kyphotic) Lumbar Assessment Lumbar Assessment: Exceptions to WFL(posterior pelvic tilt) Postural Control Postural Control: Deficits on evaluation  Balance Balance Balance Assessed: Yes Static Sitting Balance Static Sitting - Balance Support: Left upper extremity supported Static Sitting - Level of Assistance: 2: Max assist;1: +1 Total assist Dynamic Sitting Balance Dynamic Sitting - Balance Support: Feet supported Dynamic Sitting - Level of Assistance: 1: +1 Total assist Extremity/Trunk Assessment RUE Assessment RUE Assessment: Exceptions to [facility] Passive Range of Motion (PROM) Comments: limited at shoulder with pain- no sublux noted on evaluation Active Range of Motion (AROM) Comments: none General Strength Comments: 0/5 LUE Assessment LUE Assessment: Within Functional Limits     Refer to Care Plan for Long Term Goals  Recommendations for other services: Neuropsych   Discharge Criteria: Patient will be discharged from OT if patient refuses treatment 3 consecutive times without medical reason, if treatment goals not met, if there is a change in medical status, if patient makes no progress towards goals or if patient is discharged from hospital.  The above assessment, treatment plan, treatment alternatives and goals were discussed and mutually agreed upon: by patient and  by family  Gypsy Decant 04/17/2020, 12:51 PM

## 2020-04-17 NOTE — Care Management (Signed)
Inpatient Rehabilitation Center Individual Statement of Services  Patient Name:  Brad Delacruz  Date:  04/17/2020  Welcome to the Inpatient Rehabilitation Center.  Our goal is to provide you with an individualized program based on your diagnosis and situation, designed to meet your specific needs.  With this comprehensive rehabilitation program, you will be expected to participate in at least 3 hours of rehabilitation therapies Monday-Friday, with modified therapy programming on the weekends.  Your rehabilitation program will include the following services:  Physical Therapy (PT), Occupational Therapy (OT), Speech Therapy (ST), 24 hour per day rehabilitation nursing, Therapeutic Recreaction (TR), Psychology, Care Coordinator, Rehabilitation Medicine, Nutrition Services, Pharmacy Services and Other  Weekly team conferences will be held on Wednesdays to discuss your progress.  Your Inpatient Rehabilitation Care Coordinator will talk with you frequently to get your input and to update you on team discussions.  Team conferences with you and your family in attendance may also be held.  Expected length of stay: 3-4 Weeks  Overall anticipated outcome: Min to MOD A  Depending on your progress and recovery, your program may change. Your Inpatient Rehabilitation Care Coordinator will coordinate services and will keep you informed of any changes. Your Inpatient Rehabilitation Care Coordinator's name and contact numbers are listed  below.  The following services may also be recommended but are not provided by the Inpatient Rehabilitation Center:    Home Health Rehabiltiation Services  Outpatient Rehabilitation Services    Arrangements will be made to provide these services after discharge if needed.  Arrangements include referral to agencies that provide these services.  Your insurance has been verified to be: BCBS Your primary doctor is: Nita Sells, MD  Pertinent information will be shared with  your doctor and your insurance company.  Inpatient Rehabilitation Care Coordinator:  Lavera Guise, [place] [phone] or [phone]  Information discussed with and copy given to patient by: [name], 04/17/2020, 10:14 AM

## 2020-04-18 ENCOUNTER — Inpatient Hospital Stay (HOSPITAL_COMMUNITY): Payer: BC Managed Care – PPO

## 2020-04-18 ENCOUNTER — Inpatient Hospital Stay (HOSPITAL_COMMUNITY): Payer: BC Managed Care – PPO | Admitting: Speech Pathology

## 2020-04-18 ENCOUNTER — Inpatient Hospital Stay (HOSPITAL_COMMUNITY): Payer: BC Managed Care – PPO | Admitting: Physical Therapy

## 2020-04-18 LAB — GLUCOSE, CAPILLARY
Glucose-Capillary: 161 mg/dL — ABNORMAL HIGH (ref 70–99)
Glucose-Capillary: 212 mg/dL — ABNORMAL HIGH (ref 70–99)
Glucose-Capillary: 169 mg/dL — ABNORMAL HIGH (ref 70–99)
Glucose-Capillary: 202 mg/dL — ABNORMAL HIGH (ref 70–99)

## 2020-04-18 MED ORDER — LOSARTAN POTASSIUM 25 MG PO TABS
12.5000 mg | ORAL_TABLET | Freq: Every day | ORAL | Status: DC
  Administered 2020-04-19 – 2020-05-13 (×25): 12.5 mg via ORAL
  Filled 2020-04-18 (×25): qty 0.5

## 2020-04-18 MED ORDER — INSULIN GLARGINE 100 UNIT/ML ~~LOC~~ SOLN
36.0000 [IU] | Freq: Two times a day (BID) | SUBCUTANEOUS | Status: DC
  Administered 2020-04-18 – 2020-04-24 (×12): 36 [IU] via SUBCUTANEOUS
  Filled 2020-04-18 (×13): qty 0.36

## 2020-04-18 NOTE — Progress Notes (Signed)
Speech Language Pathology Daily Session Note  Patient Details  Name: Brad Delacruz MRN: [id number] Date of Birth: 1961/09/23  Today's Date: 04/18/2020 SLP Individual Time: 0900-1000 SLP Individual Time Calculation (min): 60 min  Short Term Goals: Week 1: SLP Short Term Goal 1 (Week 1): Patient will demonstrate sustained attention to functional tasks for 15 minutes with Mod verbal cues for redirection. SLP Short Term Goal 2 (Week 1): Patient will initaite tasks in 75% of opportunities with Min verbal cues. SLP Short Term Goal 3 (Week 1): Patient will vocalize on command in 25% of opportunites with Max A multimodal cues. SLP Short Term Goal 4 (Week 1): Patient will utilize gestures to answer basic yes/no questions in 75% of opportunities with Mod A verbal and visual cues. SLP Short Term Goal 5 (Week 1): Patient will consume current diet with minimal overt s/s of aspiration and Min verbal cues for use of swallowing compensatory strategies. SLP Short Term Goal 6 (Week 1): Patient will consume trials of ice chips with minimal overt s/s of aspiration over 2 sessions and Min verbal cues to assess readiness for repeat MBS.  Skilled Therapeutic Interventions:  Pt was seen for skilled ST targeting goals for swallowing and communication.  Pt was noted to be restless in chair and grabbed the side of his head while closing one eye.  He nodded yes when asked if he was in pain and was able to gesture to his shoulder with increased time.  He repeatedly answered no when therapist offered to call RN to ask for meds.  Pt was repositioned in tilt in space wheelchair and lights were dimmed in pt's room as he nodded his head yes when asked if he also had a headache in addition to shoulder pain.  Pt required max to total assist to identify an object when named from a field of two, even when objects were placed in horizontal alignment in pt's intact visual field.  Pt was able to brush his teeth with max assist for task  sequencing and initiation, although as task progressed therapist had to increase level of assist to hand over hand due to motor impersistence versus decreased attention to task.  Pt was able to spit on command after oral care in 1 out of 2 opportunities.  Pt demonstrated no overt s/s of aspiration with trials of ice chips and his oral phase was fairly automatic if not slightly prolonged.  SLP attempted to elicit voicing after trials of ice chips; however, pt was unable to coordinate respiration with phonation despite max assist multimodal cues.  Pt was returned to bed with +2 assist  From RN at the end of session.  Pt left in bed with family at bedside, bed alarm set and call bell within reach.  Continue per current plan of care.    Pain Pain Assessment Pain Scale: 0-10 Faces Pain Scale: Hurts even more Pain Type: Acute pain Pain Location: Shoulder Pain Descriptors / Indicators: Grimacing Pain Intervention(s): RN made aware Multiple Pain Sites: Yes 2nd Pain Site Pain Type: Acute pain Pain Location: Head Pain Intervention(s): Repositioned;RN made aware;Rest  Therapy/Group: Individual Therapy  Keyonta Madrid, [name] 04/18/2020, 12:48 PM

## 2020-04-18 NOTE — Progress Notes (Signed)
Physical Therapy Session Note  Patient Details  Name: Brad Delacruz MRN: [id number] Date of Birth: Apr 08, 1961  Today's Date: 04/18/2020 PT Individual Time: 1300-1410 PT Individual Time Calculation (min): 70 min   Short Term Goals: Week 1:  PT Short Term Goal 1 (Week 1): Pt will sit EOB with mod assist up to 1 minute PT Short Term Goal 2 (Week 1): Pt will tolerate sitting OOB in WC up to 2 hours between therapies PT Short Term Goal 3 (Week 1): Pt will transfer to [facility] with max assist of 2 consistently PT Short Term Goal 4 (Week 1): Pt will attend to right visual field 25% of the time  Skilled Therapeutic Interventions/Progress Updates:   Pt received supine in bed and agreeable to PT. Supine>sit transfer with max assist and heavy use of rails on the L. Reciprocal scooting to EOB with max assist and max assist to prevent lateral LOB to the R. SB transfer to [facility] with total A +2. Pt transported to in [place] WC to rail in hall. Sit<>stand x 3 with LUE on rail and max-total A to block the RLE. Pt noted to have increasing pushers syndrome with fatigue and difficulty keeping LLE within shoulder width, trying to move foot laterally toward wall. Orthostatic vital signs; sitting 91/61. Standing 92/80. No s/s of orthostasis, but noted grimmace on face, possible shoulder pain. SB transfer performed to and from mat table with max A+2. Sitting balance EOB with visual feback from mirror. Able to mainta balance EOB wth mod assist overall x 26mn with bouts of max assist when attempting to shift weight for pressure relief. Lateral reaching L to force weight shift with cross body reach with to force visual scan to the R. Static sitting with no UE 2 x 15 sec with mod assist and cues to attend to task. Attempted to have pt initiate knee extension on the R with ball kick 2 x 5 BLE, but no active movement noted and poor scanning to see ball on the R. Pt returned to room and performed SB transfer to bed with Max A+2.  Sit>supine completed with total A and left supine in bed. PT applied KT tap to R sublux with education to benefits. Call bell in reach and all needs met.        Therapy Documentation Precautions:  Precautions Precautions: Fall Restrictions Weight Bearing Restrictions: No Vital Signs: Therapy Vitals Temp: 98 F (36.7 C) Temp Source: Oral Pulse Rate: 84 Resp: 17 BP: 108/66 Patient Position (if appropriate): Lying Oxygen Therapy SpO2: 98 % O2 Device: Room Air Pain: Pain Assessment Pain Scale: 0-10 Faces Pain Scale: Hurts even more Pain Type: Acute pain Pain Location: Shoulder Pain Descriptors / Indicators: Grimacing Pain Intervention(s): RN made aware Multiple Pain Sites: Yes 2nd Pain Site Pain Type: Acute pain Pain Location: Head Pain Intervention(s): Repositioned;RN made aware;Rest    Therapy/Group: Individual Therapy  ALorie Phenix5/15/2021, 2:13 PM

## 2020-04-18 NOTE — Progress Notes (Signed)
Occupational Therapy Session Note  Patient Details  Name: Brad Delacruz MRN: [id number] Date of Birth: [birth year]  Today's Date: 04/18/2020 OT Individual Time: 0800-0900 OT Individual Time Calculation (min): 60 min    Short Term Goals: Week 1:  OT Short Term Goal 1 (Week 1): Pt will maintain static sitting balance for 2 minutes with max A. OT Short Term Goal 2 (Week 1): Pt will locate self care items from R side of sink for grooming tasks with min cuing. OT Short Term Goal 3 (Week 1): Pt will perform UB dressing with mod A overall.  Skilled Therapeutic Interventions/Progress Updates:  Pt received supine in bed with wife and interpreter present with pt agreeable to OT intervention. Pt nodding and gesturing appropriately during session however no verbal communication noted. Session focus on BADL retraining and functional training training. Pt complete bed mobility with total A this session with pt initiating with LLE but ultimately needing total A to come to EOB. Pt required MAX A for sitting balance EOB. Pt noted to close L eye during session although pt denies diplopia. Pt complete stand pivot transfer to pts L side with total A. Pt required MAX A overall for bathing at sink with pt needing assist to wash LUE via hand over hand assist. Pt required total A for dressing this session with pt needing total A +2 for sit<>stand from w/c to pull pants up to waist line. Total A to don button up shirt from w/c. Pt dependent for oral care at sink. Pt initially able to put toothbrush to mouth with LUE but made no effort to initiate brushing teeth. Remainder of session to focus on self feeding from w/c. Pt able to self feed with LUE with supervision and set- up assist as pt unable to locate items on R side of tray without MAX cues to scan to R. Pt left seated in w/c with wife and interpreter present with alarm belt activated and all needs within reach.   Therapy Documentation Precautions:   Precautions Precautions: Fall Restrictions Weight Bearing Restrictions: No General:   Vital Signs:   Pain: Pt reports no pain during session.   Therapy/Group: Individual Therapy  Angelina Pih 04/18/2020, 12:08 PM

## 2020-04-18 NOTE — Progress Notes (Signed)
[place] PHYSICAL MEDICINE & REHABILITATION PROGRESS NOTE   Subjective/Complaints: Not voiding; bladder scans ordered. Wife and daughter at bedside  ROS- cannot obtain , aphasia   Objective:   No results found. Recent Labs    04/16/20 0410 04/17/20 0613  WBC 7.5 6.7  HGB 12.6* 12.8*  HCT 36.9* 36.8*  PLT 198 196   Recent Labs    04/16/20 0410 04/17/20 0613  NA 137 137  K 4.1 3.5  CL 101 102  CO2 27 26  GLUCOSE 206* 147*  BUN 18 19  CREATININE 0.93 0.87  CALCIUM 9.4 9.4    Intake/Output Summary (Last 24 hours) at 04/18/2020 1527 Last data filed at 04/18/2020 1330 Gross per 24 hour  Intake 877 ml  Output 1000 ml  Net -123 ml     Physical Exam: Vital Signs Blood pressure 108/66, pulse 84, temperature 98 F (36.7 C), temperature source Oral, resp. rate 17, SpO2 98 %.  General: No acute distress, sleeping Mood and affect are appropriate Heart: Regular rate and rhythm no rubs murmurs or extra sounds Lungs: Clear to auscultation, breathing unlabored, no rales or wheezes Abdomen: Positive bowel sounds, soft nontender to palpation, nondistended Extremities: No clubbing, cyanosis, or edema Skin: No evidence of breakdown, no evidence of rash Neurologic: Cranial nerves II through XII intact, motor strength is 5/5 in L nd 0/5 RIght deltoid, bicep, tricep, grip, hip flexor, knee extensors, ankle dorsiflexor and plantar flexor Sensory exam cannot assess  Musculoskeletal: pain  with Left shoulder ROM, winces with ROM, Mild wincing with R Hip ROM   Assessment/Plan: 1. Functional deficits secondary to Left MCA with R HP and Aphasia  which require 3+ hours per day of interdisciplinary therapy in a comprehensive inpatient rehab setting.  Physiatrist is providing close team supervision and 24 hour management of active medical problems listed below.  Physiatrist and rehab team continue to assess barriers to discharge/monitor patient progress toward functional and medical  goals  Care Tool:  Bathing    Body parts bathed by patient: Chest, Abdomen, Front perineal area, Right upper leg, Left upper leg, Right arm   Body parts bathed by helper: Left arm     Bathing assist Assist Level: Maximal Assistance - Patient 24 - 49%     Upper Body Dressing/Undressing Upper body dressing   What is the patient wearing?: Button up shirt    Upper body assist Assist Level: Total Assistance - Patient < 25%    Lower Body Dressing/Undressing Lower body dressing      What is the patient wearing?: Incontinence brief, Pants     Lower body assist Assist for lower body dressing: Total Assistance - Patient < 25%     Toileting Toileting    Toileting assist Assist for toileting: 2 Helpers     Transfers Chair/bed transfer  Transfers assist     Chair/bed transfer assist level: Total Assistance - Patient < 25%     Locomotion Ambulation   Ambulation assist   Ambulation activity did not occur: Safety/medical concerns          Walk 10 feet activity   Assist  Walk 10 feet activity did not occur: Safety/medical concerns        Walk 50 feet activity   Assist Walk 50 feet with 2 turns activity did not occur: Safety/medical concerns         Walk 150 feet activity   Assist Walk 150 feet activity did not occur: Safety/medical concerns  Walk 10 feet on uneven surface  activity   Assist Walk 10 feet on uneven surfaces activity did not occur: Safety/medical concerns         Wheelchair     Assist        Wheelchair assist level: Dependent - Patient 0% Max wheelchair distance: 150 in TIS WC    Wheelchair 50 feet with 2 turns activity    Assist        Assist Level: Dependent - Patient 0%   Wheelchair 150 feet activity     Assist      Assist Level: Dependent - Patient 0%   Blood pressure 108/66, pulse 84, temperature 98 F (36.7 C), temperature source Oral, resp. rate 17, SpO2 98 %.  Medical  Problem List and Plan: 1.  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  TEE and loop recorder placement to be discussed as outpatient.             -patient may shower             -ELOS/Goals: 20-22 days MinA PT, OT, SLP  Continue CIR 2.  Antithrombotics: -DVT/anticoagulation: Subcutaneous heparin.  Venous Doppler studies negative             -antiplatelet therapy: Brilinta 90 mg twice daily, aspirin 81 mg daily 3. Pain Management: Tylenol as needed. Well controlled.  4. Mood: Amantadine 100 mg twice daily             -antipsychotic agents: N/A 5. Neuropsych: This patient is not capable of making decisions on his own behalf. 6. Skin/Wound Care: Routine skin checks 7. Fluids/Electrolytes/Nutrition: Routine in and outs with follow-up chemistries 8.  Dysphagia.  Dysphagia #1 honey thick liquids.   Dietary follow-up as well as speech therapy 9.  Diabetes mellitus.  Hemoglobin A1c 9.2.  NovoLog 5 units 2 times daily, Lantus insulin 35 units twice daily. Uncontrolled. CBG (last 3)  Recent Labs    04/17/20 2123 04/18/20 0607 04/18/20 1142  GLUCAP 190* 161* 212*  some lability , monitor on increase lantus  5/15: CBGs elevated; increase Lantus to 36U 10.  Hypertension.  Cozaar 50 mg daily. Well controlled Vitals:   04/18/20 0752 04/18/20 1408  BP: 110/73 108/66  Pulse: 75 84  Resp:  17  Temp:  98 F (36.7 C)  SpO2:  98%  running on low side reduce Cozaar to 25mg    5/15: hypotensive: decrease cozaar to 12.5 11.  Hyperlipidemia.  Lipitor 12.  Obesity.  BMI 29.50.  Dietary follow-up  13.  Post stroke shoulder pain - discussed with OT, add Kpad and aspercremetylenol for pain , no imaging needed at this time no trauma  14. Not voiding: Added bladder scans q6H and increased cathing frequency to q6H. Discussed with family who will encourage hydration with thickened liquids.   LOS: 2  days A FACE TO FACE EVALUATION WAS PERFORMED  6/15 Ivery Nanney 04/18/2020, 3:27 PM

## 2020-04-19 ENCOUNTER — Inpatient Hospital Stay (HOSPITAL_COMMUNITY): Payer: BC Managed Care – PPO

## 2020-04-19 DIAGNOSIS — I63512 Cerebral infarction due to unspecified occlusion or stenosis of left middle cerebral artery: Secondary | ICD-10-CM | POA: Diagnosis not present

## 2020-04-19 DIAGNOSIS — R109 Unspecified abdominal pain: Secondary | ICD-10-CM | POA: Diagnosis not present

## 2020-04-19 LAB — GLUCOSE, CAPILLARY
Glucose-Capillary: 101 mg/dL — ABNORMAL HIGH (ref 70–99)
Glucose-Capillary: 218 mg/dL — ABNORMAL HIGH (ref 70–99)
Glucose-Capillary: 193 mg/dL — ABNORMAL HIGH (ref 70–99)
Glucose-Capillary: 202 mg/dL — ABNORMAL HIGH (ref 70–99)
Glucose-Capillary: 202 mg/dL — ABNORMAL HIGH (ref 70–99)

## 2020-04-19 MED ORDER — LIDOCAINE 5 % EX PTCH
1.0000 | MEDICATED_PATCH | CUTANEOUS | Status: DC
  Administered 2020-04-19 – 2020-05-13 (×25): 1 via TRANSDERMAL
  Filled 2020-04-19 (×24): qty 1

## 2020-04-19 MED ORDER — SIMETHICONE 80 MG PO CHEW
80.0000 mg | CHEWABLE_TABLET | Freq: Four times a day (QID) | ORAL | Status: DC
  Administered 2020-04-19 – 2020-05-13 (×90): 80 mg via ORAL
  Filled 2020-04-19 (×94): qty 1

## 2020-04-19 MED ORDER — NITROFURANTOIN MONOHYD MACRO 100 MG PO CAPS
100.0000 mg | ORAL_CAPSULE | Freq: Two times a day (BID) | ORAL | Status: DC
  Administered 2020-04-20 – 2020-04-22 (×5): 100 mg via ORAL
  Filled 2020-04-19 (×5): qty 1

## 2020-04-19 NOTE — Progress Notes (Signed)
[place] PHYSICAL MEDICINE & REHABILITATION PROGRESS NOTE   Subjective/Complaints: Wife and other daughter at bedside. Wife says he has been complaining of right sided abdominal pain for a few days.  ROS- cannot obtain , aphasia   Objective:   No results found. Recent Labs    04/17/20 0613  WBC 6.7  HGB 12.8*  HCT 36.8*  PLT 196   Recent Labs    04/17/20 0613  NA 137  K 3.5  CL 102  CO2 26  GLUCOSE 147*  BUN 19  CREATININE 0.87  CALCIUM 9.4    Intake/Output Summary (Last 24 hours) at 04/19/2020 0942 Last data filed at 04/19/2020 0852 Gross per 24 hour  Intake 1137 ml  Output 1750 ml  Net -613 ml     Physical Exam: Vital Signs Blood pressure (!) 109/56, pulse 70, temperature 97.8 F (36.6 C), resp. rate 17, SpO2 96 %.  General: No acute distress, yawning but alert Mood and affect are appropriate Heart: Regular rate and rhythm no rubs murmurs or extra sounds Lungs: Clear to auscultation, breathing unlabored, no rales or wheezes Abdomen: Positive bowel sounds, soft nontender to palpation, nondistended Extremities: No clubbing, cyanosis, or edema Skin: No evidence of breakdown, no evidence of rash Neurologic: Cranial nerves II through XII intact, motor strength is 5/5 in L nd 0/5 RIght deltoid, bicep, tricep, grip, hip flexor, knee extensors, ankle dorsiflexor and plantar flexor Sensory exam cannot assess  Musculoskeletal: pain  with Left shoulder ROM, winces with ROM, Mild wincing with R Hip ROM. R shoulder with kinesiology tape.   Assessment/Plan: 1. Functional deficits secondary to Left MCA with R HP and Aphasia  which require 3+ hours per day of interdisciplinary therapy in a comprehensive inpatient rehab setting.  Physiatrist is providing close team supervision and 24 hour management of active medical problems listed below.  Physiatrist and rehab team continue to assess barriers to discharge/monitor patient progress toward functional and medical  goals  Care Tool:  Bathing    Body parts bathed by patient: Chest, Abdomen, Front perineal area, Right upper leg, Left upper leg, Right arm   Body parts bathed by helper: Left arm     Bathing assist Assist Level: Maximal Assistance - Patient 24 - 49%     Upper Body Dressing/Undressing Upper body dressing   What is the patient wearing?: Button up shirt    Upper body assist Assist Level: Total Assistance - Patient < 25%    Lower Body Dressing/Undressing Lower body dressing      What is the patient wearing?: Incontinence brief     Lower body assist Assist for lower body dressing: Total Assistance - Patient < 25%     Toileting Toileting    Toileting assist Assist for toileting: 2 Helpers     Transfers Chair/bed transfer  Transfers assist     Chair/bed transfer assist level: Total Assistance - Patient < 25%     Locomotion Ambulation   Ambulation assist   Ambulation activity did not occur: Safety/medical concerns          Walk 10 feet activity   Assist  Walk 10 feet activity did not occur: Safety/medical concerns        Walk 50 feet activity   Assist Walk 50 feet with 2 turns activity did not occur: Safety/medical concerns         Walk 150 feet activity   Assist Walk 150 feet activity did not occur: Safety/medical concerns  Walk 10 feet on uneven surface  activity   Assist Walk 10 feet on uneven surfaces activity did not occur: Safety/medical concerns         Wheelchair     Assist        Wheelchair assist level: Dependent - Patient 0% Max wheelchair distance: 150 in TIS WC    Wheelchair 50 feet with 2 turns activity    Assist        Assist Level: Dependent - Patient 0%   Wheelchair 150 feet activity     Assist      Assist Level: Dependent - Patient 0%   Blood pressure (!) 109/56, pulse 70, temperature 97.8 F (36.6 C), resp. rate 17, SpO2 96 %.  Medical Problem List and Plan: 1.   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  TEE and loop recorder placement to be discussed as outpatient.             -patient may shower             -ELOS/Goals: 20-22 days MinA PT, OT, SLP  Continue CIR 2.  Antithrombotics: -DVT/anticoagulation: Subcutaneous heparin.  Venous Doppler studies negative             -antiplatelet therapy: Brilinta 90 mg twice daily, aspirin 81 mg daily 3. Pain Management: Tylenol as needed. Well controlled.  5/16: Abdominal pain-- ordered simethicone and abdominal KUB since has been present for a few days, isolated to right side and sometimes starts in back and wraps around. Discussed potential etiologies with wife and daughter: constipation, gas, kidney stone, gallbladder inflammation.  4. Mood: Amantadine 100 mg twice daily             -antipsychotic agents: N/A 5. Neuropsych: This patient is not capable of making decisions on his own behalf. 6. Skin/Wound Care: Routine skin checks 7. Fluids/Electrolytes/Nutrition: Routine in and outs with follow-up chemistries 8.  Dysphagia.  Dysphagia #1 honey thick liquids.   Dietary follow-up as well as speech therapy 9.  Diabetes mellitus.  Hemoglobin A1c 9.2.  NovoLog 5 units 2 times daily, Lantus insulin 35 units twice daily. Uncontrolled. CBG (last 3)  Recent Labs    04/18/20 1714 04/18/20 2100 04/19/20 0615  GLUCAP 169* 202* 101*  some lability , monitor on increase lantus  5/15: CBGs elevated; increase Lantus to 36U  5/16: CBGs better controlled today 10.  Hypertension.  Cozaar 50 mg daily. Well controlled Vitals:   04/18/20 1947 04/19/20 0435  BP: 107/60 (!) 109/56  Pulse: 91 70  Resp: 19 17  Temp: (!) 97.5 F (36.4 C) 97.8 F (36.6 C)  SpO2: 98% 96%  running on low side reduce Cozaar to 25mg    5/15: hypotensive: decrease cozaar to 12.5  5/16: Hypotensive: stop Cozaar. HR well controlled.  11.   Hyperlipidemia.  Lipitor 12.  Obesity.  BMI 29.50.  Dietary follow-up  13.  Post stroke shoulder pain - discussed with OT, add Kpad and aspercremetylenol for pain , no imaging needed at this time no trauma   5/16: continue kinesiology tape, muscle crea. Added lidocaine patch.   14. Not voiding: Added bladder scans q6H and increased cathing frequency to q6H. Discussed with family who will encourage hydration with thickened liquids.   LOS: 3 days A FACE TO FACE EVALUATION WAS PERFORMED  Clide Deutscher Aija Scarfo 04/19/2020, 9:42 AM

## 2020-04-19 NOTE — Progress Notes (Signed)
Restless, attempting to get OOB. Incontinent of stool, but patient not allowing to staff to change him. Nods head no when asked if he feels ok.  Vitals WNL, except HR 108. HR bounding. Clammy. CBG-202. Rapid response paged and assessed. Paged Dr. Dalene Carrow, with above info. Orders for UA C&S and start antibiotic after urine spec sent. Will continue to monitor. Alfredo Martinez A

## 2020-04-20 ENCOUNTER — Inpatient Hospital Stay (HOSPITAL_COMMUNITY): Payer: BC Managed Care – PPO

## 2020-04-20 ENCOUNTER — Inpatient Hospital Stay (HOSPITAL_COMMUNITY): Payer: BC Managed Care – PPO | Admitting: Occupational Therapy

## 2020-04-20 DIAGNOSIS — R339 Retention of urine, unspecified: Secondary | ICD-10-CM

## 2020-04-20 DIAGNOSIS — I10 Essential (primary) hypertension: Secondary | ICD-10-CM

## 2020-04-20 LAB — URINALYSIS, COMPLETE (UACMP) WITH MICROSCOPIC
Bacteria, UA: NONE SEEN
Bilirubin Urine: NEGATIVE
Glucose, UA: 150 mg/dL — AB
Hgb urine dipstick: NEGATIVE
Ketones, ur: NEGATIVE mg/dL
Leukocytes,Ua: NEGATIVE
Nitrite: NEGATIVE
Protein, ur: 30 mg/dL — AB
Specific Gravity, Urine: 1.024 (ref 1.005–1.030)
pH: 5 (ref 5.0–8.0)

## 2020-04-20 LAB — GLUCOSE, CAPILLARY
Glucose-Capillary: 110 mg/dL — ABNORMAL HIGH (ref 70–99)
Glucose-Capillary: 160 mg/dL — ABNORMAL HIGH (ref 70–99)
Glucose-Capillary: 201 mg/dL — ABNORMAL HIGH (ref 70–99)
Glucose-Capillary: 185 mg/dL — ABNORMAL HIGH (ref 70–99)

## 2020-04-20 MED ORDER — INSULIN ASPART 100 UNIT/ML ~~LOC~~ SOLN
2.0000 [IU] | Freq: Three times a day (TID) | SUBCUTANEOUS | Status: DC
  Administered 2020-04-20 – 2020-05-13 (×62): 2 [IU] via SUBCUTANEOUS

## 2020-04-20 MED ORDER — BETHANECHOL CHLORIDE 10 MG PO TABS
10.0000 mg | ORAL_TABLET | Freq: Three times a day (TID) | ORAL | Status: DC
  Administered 2020-04-20 – 2020-05-13 (×68): 10 mg via ORAL
  Filled 2020-04-20 (×68): qty 1

## 2020-04-20 NOTE — Progress Notes (Signed)
Occupational Therapy Session Note  Patient Details  Name: Brad Delacruz MRN: [id number] Date of Birth: 04-Feb-1961  Today's Date: 04/20/2020 OT Individual Time: 1300-1410 OT Individual Time Calculation (min): 70 min    Short Term Goals: Week 1:  OT Short Term Goal 1 (Week 1): Pt will maintain static sitting balance for 2 minutes with max A. OT Short Term Goal 2 (Week 1): Pt will locate self care items from R side of sink for grooming tasks with min cuing. OT Short Term Goal 3 (Week 1): Pt will perform UB dressing with mod A overall.  Skilled Therapeutic Interventions/Progress Updates:    Upon entering the room, pt supine in bed with wife and interpreter present in the room. Pt with no signs or symptoms of pain during the session. Supine >sit with total A to EOB. Slide board transfer total +2 into wheelchair. OT assisted pt via wheelchair to gym. Pt standing in standing frame for 8 minutes and 6 minutes respectively. Pt's BP on second stand was 106/67 and HR 101 bpm. Pt fatigues very quickly with standing tasks. Pt reaching towards the L to obtain horseshoe and then cross midline to place into therapist hand towards the R. Pt needing mod cuing to scan and locate where to place horseshoe on the R side. Pt then shaking head "no" when asked to participate and closing eyes frequently. Pt returning to wheelchair. Pt placed in front of dynavision and needing hand over hand assistance to hit targets initially. Pt then only able to locate from the L of board to midline. Therapist assisting pt with head turn to locate targets on R side and even then pt needing have over hand to move strong side to hit target. OT assisting pt back towards room. Slide board transfer total +2 back into bed with total A sit >supine. OT assisted pt with repositioning in bed for comfort and to protect hemiplegic side. Pt still only answering questions with head nods "yes" or "no" 25% of the time. Wife remains present in the room  and interpreter assists her with asking therapist needed questions before exiting the room.   Therapy Documentation Precautions:  Precautions Precautions: Fall Restrictions Weight Bearing Restrictions: No Vital Signs: Therapy Vitals Temp: 98.5 F (36.9 C) Pulse Rate: 89 Resp: 18 BP: 122/64 Patient Position (if appropriate): Lying Oxygen Therapy SpO2: 100 % O2 Device: Room Air   Therapy/Group: Individual Therapy  Alen Bleacher 04/20/2020, 4:54 PM

## 2020-04-20 NOTE — Progress Notes (Signed)
Called to bedside d/t increased restlessness. On assessment pt does not appear to be in distress, pt denies pain, pt is frequently attempting to reposition self, pt has also had incontinent episode. HR 102, lungs clear, RR 20, SpO2 96% RA. Discussed with charge nurse possibility of needing bladder scan, and pt may feel better after cleaned up. If restlessness continues page MD for other potential causes. Call RRT if further assistance is needed.

## 2020-04-20 NOTE — Progress Notes (Signed)
[place] PHYSICAL MEDICINE & REHABILITATION PROGRESS NOTE   Subjective/Complaints: Pt in bed. Therapy at bedside as well as wife/interpreter. Right shoulder tender. Slept ok. No other problems reported  ROS: Limited due to language Objective:   DG Abd 1 View  Result Date: 04/19/2020 CLINICAL DATA:  Right-sided abdominal pain EXAM: ABDOMEN - 1 VIEW COMPARISON:  04/15/2020 FINDINGS: Feeding catheter is been removed in the interval. Scattered large and small bowel gas is noted. No obstructive changes are seen. No free air is noted. No bony abnormality is seen. IMPRESSION: No acute abnormality noted. Electronically Signed   By: Alcide Clever M.D.   On: 04/19/2020 11:48   No results for input(s): WBC, HGB, HCT, PLT in the last 72 hours. No results for input(s): NA, K, CL, CO2, GLUCOSE, BUN, CREATININE, CALCIUM in the last 72 hours.  Intake/Output Summary (Last 24 hours) at 04/20/2020 1359 Last data filed at 04/20/2020 1125 Gross per 24 hour  Intake 536 ml  Output 1350 ml  Net -814 ml     Physical Exam: Vital Signs Blood pressure 121/79, pulse 77, temperature 98.1 F (36.7 C), resp. rate 19, SpO2 98 %.  Constitutional: No distress . Vital signs reviewed. HEENT: EOMI, oral membranes moist Neck: supple Cardiovascular: RRR without murmur. No JVD    Respiratory/Chest: CTA Bilaterally without wheezes or rales. Normal effort    GI/Abdomen: BS +, non-tender, non-distended Ext: no clubbing, cyanosis, or edema Psych: a little flat, cooperative.  Skin: No evidence of breakdown, no evidence of rash Neurologic: Cranial nerves II through XII intact, motor strength is 5/5 in L nd 0/5 RIght deltoid, bicep, tricep, grip, hip flexor, knee extensors, ankle dorsiflexor and plantar flexor Sensory exam cannot assess  Musculoskeletal: right shoulder tender with PROM in ER/iR and with ABD.  Mild wincing with R Hip ROM.  1/4" sublux right shoulder.   Assessment/Plan: 1. Functional deficits secondary to  Left MCA with R HP and Aphasia  which require 3+ hours per day of interdisciplinary therapy in a comprehensive inpatient rehab setting.  Physiatrist is providing close team supervision and 24 hour management of active medical problems listed below.  Physiatrist and rehab team continue to assess barriers to discharge/monitor patient progress toward functional and medical goals  Care Tool:  Bathing    Body parts bathed by patient: Chest, Abdomen, Front perineal area, Right upper leg, Left upper leg, Right arm   Body parts bathed by helper: Left arm     Bathing assist Assist Level: Maximal Assistance - Patient 24 - 49%     Upper Body Dressing/Undressing Upper body dressing   What is the patient wearing?: Pull over shirt    Upper body assist Assist Level: Total Assistance - Patient < 25%    Lower Body Dressing/Undressing Lower body dressing      What is the patient wearing?: Incontinence brief     Lower body assist Assist for lower body dressing: Total Assistance - Patient < 25%     Toileting Toileting    Toileting assist Assist for toileting: 2 Helpers     Transfers Chair/bed transfer  Transfers assist     Chair/bed transfer assist level: Total Assistance - Patient < 25%     Locomotion Ambulation   Ambulation assist   Ambulation activity did not occur: Safety/medical concerns          Walk 10 feet activity   Assist  Walk 10 feet activity did not occur: Safety/medical concerns  Walk 50 feet activity   Assist Walk 50 feet with 2 turns activity did not occur: Safety/medical concerns         Walk 150 feet activity   Assist Walk 150 feet activity did not occur: Safety/medical concerns         Walk 10 feet on uneven surface  activity   Assist Walk 10 feet on uneven surfaces activity did not occur: Safety/medical concerns         Wheelchair     Assist        Wheelchair assist level: Dependent - Patient 0% Max  wheelchair distance: 150 in TIS WC    Wheelchair 50 feet with 2 turns activity    Assist        Assist Level: Dependent - Patient 0%   Wheelchair 150 feet activity     Assist      Assist Level: Dependent - Patient 0%   Blood pressure 121/79, pulse 77, temperature 98.1 F (36.7 C), resp. rate 19, SpO2 98 %.  Medical Problem List and Plan: 1.  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  TEE and loop recorder placement to be discussed as outpatient.             -patient may shower             -ELOS/Goals: 20-22 days MinA PT, OT, SLP  Continue CIR 2.  Antithrombotics: -DVT/anticoagulation: Subcutaneous heparin.  Venous Doppler studies negative             -antiplatelet therapy: Brilinta 90 mg twice daily, aspirin 81 mg daily 3. Pain Management: Tylenol as needed. Well controlled.  5/16: Abdominal pain-?d/t constipation most likely as resolved after multiple BM's.  4. Mood: Amantadine 100 mg twice daily             -antipsychotic agents: N/A 5. Neuropsych: This patient is not capable of making decisions on his own behalf. 6. Skin/Wound Care: Routine skin checks 7. Fluids/Electrolytes/Nutrition: Routine in and outs with follow-up chemistries 8.  Dysphagia.  Dysphagia #1 honey thick liquids.   Dietary follow-up as well as speech therapy 9.  Diabetes mellitus.  Hemoglobin A1c 9.2.  NovoLog 5 units 2 times daily, Lantus insulin 35 units twice daily. Uncontrolled. CBG (last 3)  Recent Labs    04/19/20 2126 04/20/20 0618 04/20/20 1132  GLUCAP 202* 110* 160*  some lability , monitor on increase lantus  5/15: CBGs elevated; increase Lantus to 36U  5/17 sugars elevate throughout the day   -add mealtime novolog, 2u tid to start 10.  Hypertension.  Cozaar 50 mg daily. Well controlled Vitals:   04/20/20 0255 04/20/20 0757  BP: 106/77 121/79  Pulse: 73 77  Resp: 19    Temp: 98.1 F (36.7 C)   SpO2: 98%   running on low side reduce Cozaar to 25mg    5/15: hypotensive: decrease cozaar to 12.5  5/16: Hypotensive: stop Cozaar. HR well controlled.   5/17 BP improved today 11.  Hyperlipidemia.  Lipitor 12.  Obesity.  BMI 29.50.  Dietary follow-up  13.  Post stroke shoulder pain - discussed with OT, add Kpad and aspercremetylenol for pain , no imaging needed at this time no trauma   5/16: continue kinesiology tape, muscle crea. continue lidocaine patch.   5/17: reinforced all the above. Keep right arm supported in socket when sitting up.  14. Urine retention--volumes 300-600cc  5/17-OOB to void  -I/O cath prn  -ua neg, ucx pending  -urecholine trial 10mg  tid LOS: 4 days A FACE TO FACE EVALUATION WAS PERFORMED  Meredith Staggers 04/20/2020, 1:59 PM

## 2020-04-20 NOTE — Progress Notes (Signed)
Speech Language Pathology Daily Session Note  Patient Details  Name: Brad Delacruz MRN: [id number] Date of Birth: 01-13-1961  Today's Date: 04/20/2020 SLP Individual Time: 0300-9233 SLP Individual Time Calculation (min): 47 min  Short Term Goals: Week 1: SLP Short Term Goal 1 (Week 1): Patient will demonstrate sustained attention to functional tasks for 15 minutes with Mod verbal cues for redirection. SLP Short Term Goal 2 (Week 1): Patient will initaite tasks in 75% of opportunities with Min verbal cues. SLP Short Term Goal 3 (Week 1): Patient will vocalize on command in 25% of opportunites with Max A multimodal cues. SLP Short Term Goal 4 (Week 1): Patient will utilize gestures to answer basic yes/no questions in 75% of opportunities with Mod A verbal and visual cues. SLP Short Term Goal 5 (Week 1): Patient will consume current diet with minimal overt s/s of aspiration and Min verbal cues for use of swallowing compensatory strategies. SLP Short Term Goal 6 (Week 1): Patient will consume trials of ice chips with minimal overt s/s of aspiration over 2 sessions and Min verbal cues to assess readiness for repeat MBS.  Skilled Therapeutic Interventions: Skilled ST services focused on swallow and language skills. Pt's daughter, wife and interpretor present for treatment session. SLP facilitated response to yes/no questions pertaining to self and immdieate environment, pt demonstrated 50% accuarcy with max A multimdoal cues with use of head nod/facial expression. SLP also facilitated common object identifcation in response to yes/no questions with maximal contrast (present pen, "is this a dog? "is this [place]?" patient's dog), pt demonstrated accuracy in 4 out 7 trials with max A multimodal cues. Pt demonstrated ability to brush teeth with oral suction toothbrush given max A multimodal cues, piror to trials of ice chips and thin via TSP. Pt demonstrated increase mastication time and slight swallow  delay with no anterior spillage noted during trials. Pt consumed x5 ice chips with x2 delayed throat clear noted when second swallow was not completed and consumption of x3 via TSP with no overt s/s aspiration, as well as max A multimodal cues for use of second swallow. Pt demonstrated reduced sustained attention and increase fatigue 20 minutes into treatment session requiring max A multimodal cues to maintain alertness. Pt was left in room with family, call bell within reach and bed alarm set. ST recommends to continue skilled ST services.      Pain Pain Assessment Pain Scale: Faces Pain Score: 0-No pain  Therapy/Group: Individual Therapy  [name]  [facility] 04/20/2020, 9:41 AM

## 2020-04-20 NOTE — Plan of Care (Signed)
  Problem: Consults Goal: RH STROKE PATIENT EDUCATION Description: See Patient Education module for education specifics  Outcome: Progressing Goal: Nutrition Consult-if indicated Outcome: Progressing Goal: Diabetes Guidelines if Diabetic/Glucose > 140 Description: If diabetic or lab glucose is > 140 mg/dl - Initiate Diabetes/Hyperglycemia Guidelines & Document Interventions  Outcome: Progressing   Problem: RH BLADDER ELIMINATION Goal: RH STG MANAGE BLADDER WITH ASSISTANCE Description: STG Manage Bladder With min Assistance Outcome: Progressing   Problem: RH SAFETY Goal: RH STG ADHERE TO SAFETY PRECAUTIONS W/ASSISTANCE/DEVICE Description: STG Adhere to Safety Precautions With supervision Assistance/Device. Outcome: Progressing   Problem: RH COGNITION-NURSING Goal: RH STG USES MEMORY AIDS/STRATEGIES W/ASSIST TO PROBLEM SOLVE Description: STG Uses Memory Aids/Strategies With min Assistance to Problem Solve. Outcome: Progressing   Problem: RH KNOWLEDGE DEFICIT Goal: RH STG INCREASE KNOWLEDGE OF DIABETES Description: Pt/family will demonstrate understanding of DM management with min assist using handout/booklets in spanish and assistance with interrupter prior to DC Outcome: Progressing Goal: RH STG INCREASE KNOWLEDGE OF HYPERTENSION Description: Pt/family will demonstrate understanding of HTN management with min assist using handout/booklets in spanish and assistance with interrupter prior to DC Outcome: Progressing Goal: RH STG INCREASE KNOWLEDGE OF DYSPHAGIA/FLUID INTAKE Description: Pt/family will demonstrate understanding of dysphagia management with min assist using handout/booklets in spanish and assistance with interrupter prior to DC Outcome: Progressing Goal: RH STG INCREASE KNOWLEGDE OF HYPERLIPIDEMIA Description: Pt/family will demonstrate understanding of HLD management with min assist using handout/booklets in spanish and assistance with interrupter prior to DC Outcome:  Progressing Goal: RH STG INCREASE KNOWLEDGE OF STROKE PROPHYLAXIS Description: Pt/family will demonstrate understanding of stroke prevention with min assist using handout/booklets in spanish and assistance with interrupter prior to DC Outcome: Progressing   

## 2020-04-20 NOTE — Progress Notes (Signed)
Physical Therapy Session Note  Patient Details  Name: Brad Delacruz MRN: [id number] Date of Birth: May 13, 1961  Today's Date: 04/20/2020 PT Individual Time: 0905-1015 PT Individual Time Calculation (min): 70 min   Short Term Goals: Week 1:  PT Short Term Goal 1 (Week 1): Pt will sit EOB with mod assist up to 1 minute PT Short Term Goal 2 (Week 1): Pt will tolerate sitting OOB in WC up to 2 hours between therapies PT Short Term Goal 3 (Week 1): Pt will transfer to [facility] with max assist of 2 consistently PT Short Term Goal 4 (Week 1): Pt will attend to right visual field 25% of the time  Skilled Therapeutic Interventions/Progress Updates:  Pt presented in bed with wife and interpreter present agreeable to therapy. Pt non-verbal during session intermittently shaking head no during session. PTA threaded pants total A and performed rolling to R modA with HOH assist for reaching for bed rail and maxA rolling to L to pull pants over hips. MD arrived to perform daily assessment. Performed supine to sit maxA x 1 with use of features and cues. Performed SB transfer to L maxA x 2 with max multimodal cues for hand placement and leaning forward. Transported to rehab gym and performed SB transfer to R maxA x 2. Remaining session focused on sitting balance both with and without mirror feedback. Initially max cues for forward gaze vs turning head to L. PTA encouraged pt to place hand on L leg vs mat to decrease pushing tendencies. Participated in reaching tasks turning head to R, reaching and placing clothespin on tray on R then returning to neutral. Pt initally required [facility] assist for reaching clothespin and placing on tray but improved to minA only for placing clothespin at specific bar. Pt was able to reach with min challenges and perform task with minA sitting balance with improvement to CGA when sitting statically. Pt also performed lateral leans to R with use of LUE across lap to push up from to return to  sitting. Pt transferred back to TIS via SB and maxA x 2 with PTA providing HOH assist to reach for arm rest to assist transfer to chair. Pt transported back to room at end of session and PTA placed full lap tray to decrease pt's attempts to pull self forward and to provide support to RUE. Pt left in TIS at end of session with PTA notifying nsg to return pt to bed in approx 1 hour, belt alarm in place, and call bell within reach.      Therapy Documentation Precautions:  Precautions Precautions: Fall Restrictions Weight Bearing Restrictions: No    Therapy/Group: Individual Therapy  Kahlen Boyde  [name], PTA  04/20/2020, 4:05 PM

## 2020-04-21 ENCOUNTER — Inpatient Hospital Stay (HOSPITAL_COMMUNITY): Payer: BC Managed Care – PPO | Admitting: Occupational Therapy

## 2020-04-21 ENCOUNTER — Inpatient Hospital Stay (HOSPITAL_COMMUNITY): Payer: BC Managed Care – PPO

## 2020-04-21 ENCOUNTER — Inpatient Hospital Stay (HOSPITAL_COMMUNITY): Payer: BC Managed Care – PPO | Admitting: *Deleted

## 2020-04-21 DIAGNOSIS — I69320 Aphasia following cerebral infarction: Secondary | ICD-10-CM | POA: Diagnosis not present

## 2020-04-21 DIAGNOSIS — E669 Obesity, unspecified: Secondary | ICD-10-CM | POA: Diagnosis not present

## 2020-04-21 DIAGNOSIS — K760 Fatty (change of) liver, not elsewhere classified: Secondary | ICD-10-CM | POA: Diagnosis not present

## 2020-04-21 DIAGNOSIS — Q211 Atrial septal defect: Secondary | ICD-10-CM | POA: Diagnosis not present

## 2020-04-21 DIAGNOSIS — I69351 Hemiplegia and hemiparesis following cerebral infarction affecting right dominant side: Secondary | ICD-10-CM | POA: Diagnosis not present

## 2020-04-21 DIAGNOSIS — I63512 Cerebral infarction due to unspecified occlusion or stenosis of left middle cerebral artery: Secondary | ICD-10-CM | POA: Diagnosis not present

## 2020-04-21 LAB — URINE CULTURE: Culture: NO GROWTH

## 2020-04-21 LAB — TROPONIN I (HIGH SENSITIVITY)
Troponin I (High Sensitivity): 7 ng/L (ref <18)
Troponin I (High Sensitivity): 7 ng/L (ref <18)

## 2020-04-21 LAB — GLUCOSE, CAPILLARY
Glucose-Capillary: 80 mg/dL (ref 70–99)
Glucose-Capillary: 115 mg/dL — ABNORMAL HIGH (ref 70–99)
Glucose-Capillary: 162 mg/dL — ABNORMAL HIGH (ref 70–99)
Glucose-Capillary: 189 mg/dL — ABNORMAL HIGH (ref 70–99)

## 2020-04-21 MED ORDER — ACETAMINOPHEN 325 MG PO TABS
650.0000 mg | ORAL_TABLET | Freq: Three times a day (TID) | ORAL | Status: DC
  Administered 2020-04-21 – 2020-05-13 (×66): 650 mg via ORAL
  Filled 2020-04-21 (×66): qty 2

## 2020-04-21 MED ORDER — SIMETHICONE 80 MG PO CHEW: 80.0000 mg | CHEWABLE_TABLET | Freq: Four times a day (QID) | ORAL | Status: DC

## 2020-04-21 NOTE — Progress Notes (Signed)
[place] PHYSICAL MEDICINE & REHABILITATION PROGRESS NOTE   Subjective/Complaints: Continues to have right shoulder and right upper quadrant pain. Also complaining of chest pain to RN today.   ROS: Limited due to language Objective:   DG Abd 1 View  Result Date: 04/19/2020 CLINICAL DATA:  Right-sided abdominal pain EXAM: ABDOMEN - 1 VIEW COMPARISON:  04/15/2020 FINDINGS: Feeding catheter is been removed in the interval. Scattered large and small bowel gas is noted. No obstructive changes are seen. No free air is noted. No bony abnormality is seen. IMPRESSION: No acute abnormality noted. Electronically Signed   By: Inez Catalina M.D.   On: 04/19/2020 11:48   No results for input(s): WBC, HGB, HCT, PLT in the last 72 hours. No results for input(s): NA, K, CL, CO2, GLUCOSE, BUN, CREATININE, CALCIUM in the last 72 hours.  Intake/Output Summary (Last 24 hours) at 04/21/2020 0855 Last data filed at 04/21/2020 0618 Gross per 24 hour  Intake 560 ml  Output 1900 ml  Net -1340 ml     Physical Exam: Vital Signs Blood pressure 133/81, pulse 73, temperature 98.3 F (36.8 C), resp. rate 18, SpO2 98 %.  Constitutional: No distress . Vital signs reviewed. HEENT: EOMI, oral membranes moist Neck: supple Cardiovascular: RRR without murmur. No JVD    Respiratory/Chest: CTA Bilaterally without wheezes or rales. Normal effort    GI/Abdomen: BS +, + tenderness in RUQ, non-distended Ext: no clubbing, cyanosis, or edema Psych: a little flat, cooperative.  Skin: No evidence of breakdown, no evidence of rash Neurologic: Cranial nerves II through XII intact, motor strength is 5/5 in L nd 0/5 RIght deltoid, bicep, tricep, grip, hip flexor, knee extensors, ankle dorsiflexor and plantar flexor Sensory exam cannot assess  Musculoskeletal: right shoulder tender with PROM in ER/iR and with ABD.  Mild wincing with R Hip ROM.  1/4" sublux right shoulder.   Assessment/Plan: 1. Functional deficits secondary to  Left MCA with R HP and Aphasia  which require 3+ hours per day of interdisciplinary therapy in a comprehensive inpatient rehab setting.  Physiatrist is providing close team supervision and 24 hour management of active medical problems listed below.  Physiatrist and rehab team continue to assess barriers to discharge/monitor patient progress toward functional and medical goals  Care Tool:  Bathing    Body parts bathed by patient: Chest, Abdomen, Front perineal area, Right upper leg, Left upper leg, Right arm   Body parts bathed by helper: Left arm     Bathing assist Assist Level: Maximal Assistance - Patient 24 - 49%     Upper Body Dressing/Undressing Upper body dressing   What is the patient wearing?: Pull over shirt    Upper body assist Assist Level: Total Assistance - Patient < 25%    Lower Body Dressing/Undressing Lower body dressing      What is the patient wearing?: Incontinence brief     Lower body assist Assist for lower body dressing: Total Assistance - Patient < 25%     Toileting Toileting    Toileting assist Assist for toileting: 2 Helpers     Transfers Chair/bed transfer  Transfers assist     Chair/bed transfer assist level: 2 Helpers     Locomotion Ambulation   Ambulation assist   Ambulation activity did not occur: Safety/medical concerns          Walk 10 feet activity   Assist  Walk 10 feet activity did not occur: Safety/medical concerns        Walk  50 feet activity   Assist Walk 50 feet with 2 turns activity did not occur: Safety/medical concerns         Walk 150 feet activity   Assist Walk 150 feet activity did not occur: Safety/medical concerns         Walk 10 feet on uneven surface  activity   Assist Walk 10 feet on uneven surfaces activity did not occur: Safety/medical concerns         Wheelchair     Assist        Wheelchair assist level: Dependent - Patient 0% Max wheelchair distance: 150 in  TIS WC    Wheelchair 50 feet with 2 turns activity    Assist        Assist Level: Dependent - Patient 0%   Wheelchair 150 feet activity     Assist      Assist Level: Dependent - Patient 0%   Blood pressure 133/81, pulse 73, temperature 98.3 F (36.8 C), resp. rate 18, SpO2 98 %.  Medical Problem List and Plan: 1.  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  TEE and loop recorder placement to be discussed as outpatient.             -patient may shower             -ELOS/Goals: 20-22 days MinA PT, OT, SLP  Continue CIR 2.  Antithrombotics: -DVT/anticoagulation: Subcutaneous heparin.  Venous Doppler studies negative             -antiplatelet therapy: Brilinta 90 mg twice daily, aspirin 81 mg daily 3. Pain Management: Tylenol as needed. Well controlled.  5/16: Abdominal pain-?d/t constipation most likely as resolved after multiple BM's.   5/18: Has had BMs, XR reviewed and shows no acute abnormalities but +presence of gas. Start Simethicone and ordered RUQ to assess for gallbladder pathology.  4. Mood: Amantadine 100 mg twice daily             -antipsychotic agents: N/A 5. Neuropsych: This patient is not capable of making decisions on his own behalf. 6. Skin/Wound Care: Routine skin checks 7. Fluids/Electrolytes/Nutrition: Routine in and outs with follow-up chemistries 8.  Dysphagia.  Dysphagia #1 honey thick liquids.   Dietary follow-up as well as speech therapy 9.  Diabetes mellitus.  Hemoglobin A1c 9.2.  NovoLog 5 units 2 times daily, Lantus insulin 35 units twice daily. Uncontrolled. CBG (last 3)  Recent Labs    04/20/20 1648 04/20/20 2141 04/21/20 0608  GLUCAP 201* 185* 80  some lability , monitor on increase lantus  5/15: CBGs elevated; increase Lantus to 36U  5/17 sugars elevate throughout the day   -add mealtime novolog, 2u tid to start  5/18: well  controlled.  10.  Hypertension.  Cozaar 50 mg daily. Well controlled Vitals:   04/21/20 0527 04/21/20 0800  BP: 126/74 133/81  Pulse: 68 73  Resp: 18 18  Temp: 98.3 F (36.8 C)   SpO2: 98%   running on low side reduce Cozaar to 25mg    5/15: hypotensive: decrease cozaar to 12.5  5/16: Hypotensive: stop Cozaar. HR well controlled.   5/17 BP improved today 11.  Hyperlipidemia.  Lipitor 12.  Obesity.  BMI 29.50.  Dietary follow-up  13.  Post stroke shoulder pain - discussed with OT, add Kpad and aspercremetylenol for pain , no imaging needed at this time no trauma  5/16: continue kinesiology tape, muscle crea. continue lidocaine patch.   5/17: reinforced all the above. Keep right arm supported in socket when sitting up.  14. Urine retention--volumes 300-600cc  5/17-OOB to void  -I/O cath prn  -ua neg, ucx pending  -urecholine trial 10mg  tid 15. Chest pain: EKG obtained and shows NSR. Troponin pending. Likely costochondritis; lidocaine patch applied.  LOS: 5 days A FACE TO FACE EVALUATION WAS PERFORMED  Darthy Manganelli 04/21/2020, 8:55 AM

## 2020-04-21 NOTE — Plan of Care (Signed)
  Problem: Consults Goal: RH STROKE PATIENT EDUCATION Description: See Patient Education module for education specifics  Outcome: Progressing Goal: Nutrition Consult-if indicated Outcome: Progressing Goal: Diabetes Guidelines if Diabetic/Glucose > 140 Description: If diabetic or lab glucose is > 140 mg/dl - Initiate Diabetes/Hyperglycemia Guidelines & Document Interventions  Outcome: Progressing   Problem: RH BLADDER ELIMINATION Goal: RH STG MANAGE BLADDER WITH ASSISTANCE Description: STG Manage Bladder With min Assistance Outcome: Progressing   Problem: RH SAFETY Goal: RH STG ADHERE TO SAFETY PRECAUTIONS W/ASSISTANCE/DEVICE Description: STG Adhere to Safety Precautions With supervision Assistance/Device. Outcome: Progressing   Problem: RH COGNITION-NURSING Goal: RH STG USES MEMORY AIDS/STRATEGIES W/ASSIST TO PROBLEM SOLVE Description: STG Uses Memory Aids/Strategies With min Assistance to Problem Solve. Outcome: Progressing   Problem: RH KNOWLEDGE DEFICIT Goal: RH STG INCREASE KNOWLEDGE OF DIABETES Description: Pt/family will demonstrate understanding of DM management with min assist using handout/booklets in spanish and assistance with interrupter prior to DC Outcome: Progressing Goal: RH STG INCREASE KNOWLEDGE OF HYPERTENSION Description: Pt/family will demonstrate understanding of HTN management with min assist using handout/booklets in spanish and assistance with interrupter prior to DC Outcome: Progressing Goal: RH STG INCREASE KNOWLEDGE OF DYSPHAGIA/FLUID INTAKE Description: Pt/family will demonstrate understanding of dysphagia management with min assist using handout/booklets in spanish and assistance with interrupter prior to DC Outcome: Progressing Goal: RH STG INCREASE KNOWLEGDE OF HYPERLIPIDEMIA Description: Pt/family will demonstrate understanding of HLD management with min assist using handout/booklets in spanish and assistance with interrupter prior to DC Outcome:  Progressing Goal: RH STG INCREASE KNOWLEDGE OF STROKE PROPHYLAXIS Description: Pt/family will demonstrate understanding of stroke prevention with min assist using handout/booklets in spanish and assistance with interrupter prior to DC Outcome: Progressing   

## 2020-04-21 NOTE — Progress Notes (Signed)
Occupational Therapy Session Note  Patient Details  Name: Brad Delacruz MRN: [id number] Date of Birth: 07/05/1961  Today's Date: 04/21/2020 OT Individual Time: 0900-1000 OT Individual Time Calculation (min): 60 min    Short Term Goals: Week 1:  OT Short Term Goal 1 (Week 1): Pt will maintain static sitting balance for 2 minutes with max A. OT Short Term Goal 2 (Week 1): Pt will locate self care items from R side of sink for grooming tasks with min cuing. OT Short Term Goal 3 (Week 1): Pt will perform UB dressing with mod A overall.  Skilled Therapeutic Interventions/Progress Updates:    patient in bed, alert, wife, daughter and interpreter present at start of session.  He follows basic directions with increased time and moderate cues (Hand over hand at times, backward chain).  Inconsistent with Y/N response and limited verbal responses.  He indicates pain at times in right shoulder with change of position, cues for attention to right side.  LB bathing and dressing at bed level with max A.  Supine to sitting with max A.  Unsupported sitting with CG/minA at edge of bed. Sit pivot transfer to left with mod A.  UB bathing and dressing w/c level with mod/max A.  Sit pivot transfer TIS to mat table max A to right.  He tolerates sitting in unsupported position for 20 minutes with focus on posture, scapular mobility and positioning, weight bearing L UE, trunk control and mobility.  Completed lateral leans with mod A.  He is able to maintain unsupported sitting with CS.  Sit pivot transfer to w/c max A and back to bed at close of session with max A - increase in pushing to the right noted with fatigue.  Max A sitting to supine.  Bed alarm set and call bell in reach.    Therapy Documentation Precautions:  Precautions Precautions: Fall Restrictions Weight Bearing Restrictions: No   Therapy/Group: Individual Therapy  Barrie Lyme 04/21/2020, 7:50 AM

## 2020-04-21 NOTE — Progress Notes (Addendum)
Patient c/o chest soreness in upper mid area. He states he has had this pain before and it's from his fall. VS WNL and no change in patient's assessment. Lidocaine patch applied. Also c/o pain in his right abdominal area, which is not new pain. Provider was notified.

## 2020-04-21 NOTE — Progress Notes (Signed)
Physical Therapy Session Note  Patient Details  Name: Brad Delacruz MRN: [id number] Date of Birth: 1961/04/25  Today's Date: 04/21/2020 PT Individual Time: 7253-6644 PT Individual Time Calculation (min): 70 min   Short Term Goals: Week 1:  PT Short Term Goal 1 (Week 1): Pt will sit EOB with mod assist up to 1 minute PT Short Term Goal 2 (Week 1): Pt will tolerate sitting OOB in WC up to 2 hours between therapies PT Short Term Goal 3 (Week 1): Pt will transfer to [facility] with max assist of 2 consistently PT Short Term Goal 4 (Week 1): Pt will attend to right visual field 25% of the time  Skilled Therapeutic Interventions/Progress Updates: Pt presented in bed with wife and interpreter present agreeable to therapy. Pt nodding head yes when asked about shoulder pain but unable to indicate level. Pt initiated roll to R and PTA provided maxA for supine to sit with use of bed features. Performed squat pivot to TIS total A x 1 however pt was able to initiate scoot to L once in TIS to reposition. Pt transported to ortho gym and participated in standing trials with standing frame three 5 minute bouts. While in standing PTA provided facilitation of wt shift to R while blocking R knee for load bearing tolerance. Pt also participated in reaching activities initially attempting reaching and placing horseshoes onto mirror however limited by height. Pt then participated in pulling post-it notes from mirror with emphasis on R side for visual scanning. Pt required max to total A for reaching as pt was unable to locate items moderately past midline. PTA noted that pt would consistently close L eye when scanning for items. When asked via yes/no questions pt denied double vision (and did not squint) with item directly in front of pt, but nodded yes when asked if closing x 1 eye was clearer. Pt was also able to follow cues when lowering pt to reach back for chair to assist with controlled sitting. Pt then transported to  rehab gym and participated in STS x 1 in parallel bars. Pt was maxA STS demonstrating little pushing and was able to maintain static stand with PTA blocking R knee for approx 30 sec. PTA also noted possible BM with STS. Pt transported back to room and was able to perform [place] transfer maxA for STS and max cues to sit in [place]. Pt was modA STS from [place] after transfer. Once Stedy removed pt impulsively transferred to supine attempting to position into long sit with PTA provided maxA for RLE management and safety. Pt rolled to R modA with confirmation of BM. Pt returned to supine and left with bed alarm on, and nursing notified of pt's disposition.      Therapy Documentation Precautions:  Precautions Precautions: Fall Restrictions Weight Bearing Restrictions: No General:   Vital Signs: Therapy Vitals Temp: (!) 97.5 F (36.4 C) Pulse Rate: 78 Resp: 16 BP: 110/70 Patient Position (if appropriate): Lying Oxygen Therapy SpO2: 97 % O2 Device: Room Air Pain:     Therapy/Group: Individual Therapy  [place] Thresher  Merlin Ege, PTA  04/21/2020, 4:22 PM

## 2020-04-21 NOTE — Progress Notes (Signed)
Speech Language Pathology Daily Session Note  Patient Details  Name: Marteze Vecchio MRN: [id number] Date of Birth: Nov 25, 1961  Today's Date: 04/21/2020 SLP Individual Time: 0802-0900 SLP Individual Time Calculation (min): 58 min  Short Term Goals: Week 1: SLP Short Term Goal 1 (Week 1): Patient will demonstrate sustained attention to functional tasks for 15 minutes with Mod verbal cues for redirection. SLP Short Term Goal 2 (Week 1): Patient will initaite tasks in 75% of opportunities with Min verbal cues. SLP Short Term Goal 3 (Week 1): Patient will vocalize on command in 25% of opportunites with Max A multimodal cues. SLP Short Term Goal 4 (Week 1): Patient will utilize gestures to answer basic yes/no questions in 75% of opportunities with Mod A verbal and visual cues. SLP Short Term Goal 5 (Week 1): Patient will consume current diet with minimal overt s/s of aspiration and Min verbal cues for use of swallowing compensatory strategies. SLP Short Term Goal 6 (Week 1): Patient will consume trials of ice chips with minimal overt s/s of aspiration over 2 sessions and Min verbal cues to assess readiness for repeat MBS.  Skilled Therapeutic Interventions: Skilled ST services focused on swallow and language skills. Pt's wife was present feeding pt breakfast upon entering room. SLP noticed nectar thick liquid verse honey thick liquid on tray. Pt had consumed 6 oz of nectar thick liquids via TSP, pt's wife and daughter confirmed no coughing was noted. SLP provided education on honey verse nectar thick liquids and how to check containers with color system. SLP later communicated with pt's NT to check containers sent from kitchen to ensure honey thick liquids are being consumed by pt. SLP request pt feed himself dys 1 and honey thick liquids via cup, pt consumed with large bites and sips with right anterior spillage of dys 1, however able to clear oral cavity and no overt s/s aspiration noted, indicating  improvement in oral control. Pt demonstrated ability to follow most 1 step functional commands with 80% accuracy given mod A multimodal cues. Pt was unable to imitate oral motor movements or vocalize given demonstration by SLP and picture cards. SLP assessed written communication abilities, pt was able to trace vertical line in the letter "L" [place]) in out 1 out 5 opportunities with max A multimodal cues. Pt was able to completing missing parts of simple pictures (example: draw in missing eye on cat face) in 2 out 4 opportunities with mod A multimodal cues. Pt responded to yes/ no questions in identifying pictures of common items (example: cow) in 2 out 4 opportunities, demonstrating head nod for "yes" and facial grimace for "no." Pt consumed ice chips x3 following oral care, demonstrating no overt s/s aspiration in which swallow appeared timely. Pt required mod A verbal cues for sustained attention in 5 minute intervals. Pt was handed off to OT. ST recommends to continue skilled ST services.      Pain Pain Assessment Pain Scale: Faces Pain Score: 0-No pain Faces Pain Scale: No hurt Pain Type: Acute pain Pain Location: Chest Pain Orientation: Upper;Mid Pain Descriptors / Indicators: Sore Pain Frequency: Occasional Pain Onset: Gradual Pain Intervention(s): Medication (See eMAR) 2nd Pain Site Pain Type: Acute pain Pain Location: Shoulder Pain Intervention(s): Repositioned  Therapy/Group: Individual Therapy  [name]  [facility] 04/21/2020, 9:53 AM

## 2020-04-22 ENCOUNTER — Inpatient Hospital Stay (HOSPITAL_COMMUNITY): Payer: BC Managed Care – PPO

## 2020-04-22 ENCOUNTER — Inpatient Hospital Stay (HOSPITAL_COMMUNITY): Payer: BC Managed Care – PPO | Admitting: Occupational Therapy

## 2020-04-22 ENCOUNTER — Inpatient Hospital Stay (HOSPITAL_COMMUNITY): Payer: BC Managed Care – PPO | Admitting: Physical Therapy

## 2020-04-22 LAB — GLUCOSE, CAPILLARY
Glucose-Capillary: 110 mg/dL — ABNORMAL HIGH (ref 70–99)
Glucose-Capillary: 174 mg/dL — ABNORMAL HIGH (ref 70–99)
Glucose-Capillary: 136 mg/dL — ABNORMAL HIGH (ref 70–99)
Glucose-Capillary: 173 mg/dL — ABNORMAL HIGH (ref 70–99)

## 2020-04-22 MED ORDER — TAMSULOSIN HCL 0.4 MG PO CAPS
0.4000 mg | ORAL_CAPSULE | Freq: Every day | ORAL | Status: DC
  Administered 2020-04-22 – 2020-05-12 (×21): 0.4 mg via ORAL
  Filled 2020-04-22 (×21): qty 1

## 2020-04-22 NOTE — Patient Care Conference (Signed)
Inpatient RehabilitationTeam Conference and Plan of Care Update Date: 04/22/2020   Time: 1:50 PM    Patient Name: Brad Delacruz      Medical Record Number: [id number]  Date of Birth: 1961-10-06 Sex: Male         Room/Bed: 4W23C/4W23C-01 Payor Info: Payor: BLUE CROSS BLUE SHIELD / Plan: BCBS COMM PPO / Product Type: *No Product type* /    Admit Date/Time:  04/16/2020  3:38 PM  Primary Diagnosis:  Left middle cerebral artery stroke [facility])  Patient Active Problem List   Diagnosis Date Noted  . SAH (subarachnoid hemorrhage) (HCC) 04/16/2020  . Cerebral edema (HCC) 04/16/2020  . Pneumonia 04/16/2020  . Hypertensive emergency 04/16/2020  . Hyperlipidemia 04/16/2020  . Diabetes mellitus type II, uncontrolled (HCC) 04/16/2020  . Dysphagia 04/16/2020  . Protein-calorie malnutrition (HCC) 04/16/2020  . Urinary retention 04/16/2020  . 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 Acute respiratory failure (HCC)   . Acute ischemic stroke (HCC) L MCA d/t L M1 oclusion s/p MCA stent 03/24/2020  . CVA (cerebral vascular accident) (HCC) 03/24/2020  . Middle cerebral artery embolism, left 03/24/2020    Expected Discharge Date: Expected Discharge Date: 05/13/20  Team Members Present: Physician leading conference: Dr. Claudette Laws Care Coodinator Present: Cheyenne Adas, RN, BSN, CRRN;Christina Vita Barley, BSW Nurse Present: Mosetta Pigeon, RN PT Present: Grier Rocher, PT OT Present: Jackquline Denmark, OT SLP Present: Colin Benton, SLP PPS Coordinator present : Fae Pippin, SLP     Current Status/Progress Goal Weekly Team Focus  Bowel/Bladder   Currently incontinent of bowel and bladder, requiring bladder scans every 6 hours and straight caths if no void in 6 hours  Continence of bowel and bladder, with no urinary retention  Toilet training with focus on urinating spontaneously   Swallow/Nutrition/ Hydration   Max- Mod A, HTL and Dys 1, anteiror right spillage, small  bites  Supervision A  trials of thin TSP/Ice chips , trials of dsy 2, use of swallow strategies and repeat MBS   ADL's   total A slide board transfer, total A LB self care, max A UB self care, L gaze preference, pusher symdrome and on night bath  mod A LB and toileting with min A all other goals  self care retraining, balance, functional transfers, visual scanning, endurance, strength   Mobility   maxA - dependent bed mobility, total A STS, maxA x 2 SB transfer, max - minA sitting balance, L gaze preference  modA with minA for w/c mobilty  sitting balance, standing tolerance, cognitive remediation, transfers, d/c planning   Communication   Total A vocalizing, Max A yes/no 50% accuracy, following 1 step commands mod A  Min A auditory comprehenison, Min A express/wants need multimodal and Max A naming/verbalizing  vocalizing vowels/ basic phonemes, expressing wants needs via yes/no, gestures, facial expression, writting/drawling and object idenitfcation   Safety/Cognition/ Behavioral Observations  Max-Mod A sustained attention in 5 minute intervals  Mod A emergent awareness, Supervision A sustained attention  sustained attention, basic problem solving to address awareness   Pain   Currently complains of pain to right abdomen and leg  To maintain comfort level and decrease pain to tolerable level  Decreasing painful stimuli and utilizing pain medications as needed to help alleviate pain   Skin   No known current skin issues  Maintain skin integrity  maintain skin integrity    Rehab Goals Patient on target to meet rehab goals: Yes Rehab Goals Revised: on targe with current goals *  See Care Plan and progress notes for long and short-term goals.     Barriers to Discharge  Current Status/Progress Possible Resolutions Date Resolved   Nursing                  PT                    OT                  SLP                Care Coordinator                Discharge Planning/Teaching Needs:   plans to discharge home  will schedule if needed   Team Discussion:  Global aphasia, abd pain and nausea however [name] and KUB were negative.  Urinary retention issues; requires I+O cath. Currently on D1; honey thick diet with min-mod assist goals overall  Revisions to Treatment Plan:  Shoulder pain; hemiplegic extremity: MD recommended electrical stimulation trial for pain management. Start medication for urinary retention. SLP initiate H2O protocol and follow up MBS 04/27/20.    Medical Summary Current Status: urinary retention , UCx neg, Right shoulder pain (hemiplegic) Weekly Focus/Goal: RIght shouder pain tx, urinary retention tx  Barriers to Discharge: Medical stability   Possible Resolutions to Barriers: med management of retention and R shoulder pain   Continued Need for Acute Rehabilitation Level of Care: The patient requires daily medical management by a physician with specialized training in physical medicine and rehabilitation for the following reasons: Direction of a multidisciplinary physical rehabilitation program to maximize functional independence : Yes Medical management of patient stability for increased activity during participation in an intensive rehabilitation regime.: Yes Analysis of laboratory values and/or radiology reports with any subsequent need for medication adjustment and/or medical intervention. : Yes   I attest that I was present, lead the team conference, and concur with the assessment and plan of the team.   Dorien Chihuahua B 04/22/2020, 1:50 PM

## 2020-04-22 NOTE — Progress Notes (Signed)
Physical Therapy Session Note  Patient Details  Name: Brad Delacruz MRN: [id number] Date of Birth: [birth year]  Today's Date: 04/22/2020 PT Individual Time: 0800-0900 PT Individual Time Calculation (min): 60 min   Short Term Goals: Week 1:  PT Short Term Goal 1 (Week 1): Pt will sit EOB with mod assist up to 1 minute PT Short Term Goal 2 (Week 1): Pt will tolerate sitting OOB in WC up to 2 hours between therapies PT Short Term Goal 3 (Week 1): Pt will transfer to [facility] with max assist of 2 consistently PT Short Term Goal 4 (Week 1): Pt will attend to right visual field 25% of the time  Skilled Therapeutic Interventions/Progress Updates:   Pt received supine in bed and agreeable to PT. Supine>sit transfer with mod assis and heavy use of bead features with LUE. Sitting balance EOB with min-mod assist x 5 minutes. Dressing with total A for clothing management and to sustain mildine while donning pants and shirt. Max A+2 SB transfers. Improved participation from Pt with noted improvement in sequencing as well.   Standing frame. To perform lateral reaches to the L to forces weight shifting L to combat pushing tendencies. X10 and + 5x2. Shoulder pain limits increased time in standing as well as possible nausea, as pt burping throughout time in standing with grimace.     Gait training at rail in hall. X 43f and 223fwith total A +2 for WC follow. PT required to stabilize trunk with RUE over therapist shoulder as well as RLE lib advancement and knee stabilization to prevent collapse. Mac cues for step length L; noted to have trace hip extensor activation on the Rwith step through on the L     Kinetron  2 x 2 min with max fading to mod assist to initiate movement on the R pt was noted to have increasing activation of the RLE with increased time as well as improved symmetry R vs L  Pt returned to room and performed squat pivot transfer to bed with max assist of 1. Sit>supine completed with max assist  of 1, and left supine in bed with call bell in reach and all needs met.        Therapy Documentation Precautions:  Precautions Precautions: Fall Restrictions Weight Bearing Restrictions: No  Pain: Faces: hurts a little. Pt repositioned.     Therapy/Group: Individual Therapy  AuLorie Phenix/19/2021, 11:31 AM

## 2020-04-22 NOTE — Progress Notes (Signed)
[facility] PHYSICAL MEDICINE & REHABILITATION PROGRESS NOTE   Subjective/Complaints:  No issues overnite  Interpreter at bedside  Pt remains with severe global aphasia  ROS: Limited due to aphasia  Objective:   US Abdomen Limited RUQ  Result Date: 04/21/2020 CLINICAL DATA:  Abdominal pain. EXAM: ULTRASOUND ABDOMEN LIMITED RIGHT UPPER QUADRANT COMPARISON:  None. FINDINGS: Gallbladder: No gallstones or wall thickening visualized (2.5 mm). No sonographic Murphy sign noted by sonographer. Common bile duct: Diameter: 4.2 mm Liver: No focal lesion identified. There is diffusely increased echogenicity of the liver parenchyma. Portal vein is patent on color Doppler imaging with normal direction of blood flow towards the liver. Other: None. IMPRESSION: Fatty liver. Electronically Signed   By: Virgina Norfolk M.D.   On: 04/21/2020 17:56   No results for input(s): WBC, HGB, HCT, PLT in the last 72 hours. No results for input(s): NA, K, CL, CO2, GLUCOSE, BUN, CREATININE, CALCIUM in the last 72 hours.  Intake/Output Summary (Last 24 hours) at 04/22/2020 0919 Last data filed at 04/21/2020 2227 Gross per 24 hour  Intake 240 ml  Output 1400 ml  Net -1160 ml     Physical Exam: Vital Signs Blood pressure 114/65, pulse 67, temperature 98.4 F (36.9 C), resp. rate 16, SpO2 97 %.  Constitutional: No distress . Vital signs reviewed. HEENT: EOMI, oral membranes moist Neck: supple Cardiovascular: RRR without murmur. No JVD    Respiratory/Chest: CTA Bilaterally without wheezes or rales. Normal effort    GI/Abdomen: BS +, + tenderness in RUQ, non-distended Ext: no clubbing, cyanosis, or edema Psych: a little flat, cooperative.  Skin: No evidence of breakdown, no evidence of rash Neurologic: Cranial nerves II through XII intact, motor strength is 5/5 in L nd 0/5 RIght deltoid, bicep, tricep, grip, hip flexor, knee extensors, ankle dorsiflexor and plantar flexor Sensory exam cannot assess   Musculoskeletal: right shoulder tender with PROM in ER/iR and with ABD.  Mild wincing with R Hip ROM.  1/4" sublux right shoulder.   Assessment/Plan: 1. Functional deficits secondary to Left MCA with R HP and Aphasia  which require 3+ hours per day of interdisciplinary therapy in a comprehensive inpatient rehab setting.  Physiatrist is providing close team supervision and 24 hour management of active medical problems listed below.  Physiatrist and rehab team continue to assess barriers to discharge/monitor patient progress toward functional and medical goals  Care Tool:  Bathing    Body parts bathed by patient: Chest, Abdomen, Front perineal area, Right upper leg, Left upper leg, Right arm   Body parts bathed by helper: Left arm     Bathing assist Assist Level: Maximal Assistance - Patient 24 - 49%     Upper Body Dressing/Undressing Upper body dressing   What is the patient wearing?: Pull over shirt    Upper body assist Assist Level: Total Assistance - Patient < 25%    Lower Body Dressing/Undressing Lower body dressing      What is the patient wearing?: Incontinence brief     Lower body assist Assist for lower body dressing: Total Assistance - Patient < 25%     Toileting Toileting    Toileting assist Assist for toileting: 2 Helpers     Transfers Chair/bed transfer  Transfers assist     Chair/bed transfer assist level: 2 Helpers     Locomotion Ambulation   Ambulation assist   Ambulation activity did not occur: Safety/medical concerns          Walk 10 feet activity  Assist  Walk 10 feet activity did not occur: Safety/medical concerns        Walk 50 feet activity   Assist Walk 50 feet with 2 turns activity did not occur: Safety/medical concerns         Walk 150 feet activity   Assist Walk 150 feet activity did not occur: Safety/medical concerns         Walk 10 feet on uneven surface  activity   Assist Walk 10 feet on  uneven surfaces activity did not occur: Safety/medical concerns         Wheelchair     Assist        Wheelchair assist level: Dependent - Patient 0% Max wheelchair distance: 150 in TIS WC    Wheelchair 50 feet with 2 turns activity    Assist        Assist Level: Dependent - Patient 0%   Wheelchair 150 feet activity     Assist      Assist Level: Dependent - Patient 0%   Blood pressure 114/65, pulse 67, temperature 98.4 F (36.9 C), resp. rate 16, SpO2 97 %.  Medical Problem List and Plan: 1.  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  TEE and loop recorder placement to be discussed as outpatient.             -patient may shower             -ELOS/Goals: 20-22 days MinA PT, OT, SLP  Continue CIR Team conference today please see physician documentation under team conference tab, met with team  to discuss problems,progress, and goals. Formulized individual treatment plan based on medical history, underlying problem and comorbidities. 2.  Antithrombotics: -DVT/anticoagulation: Subcutaneous heparin.  Venous Doppler studies negative             -antiplatelet therapy: Brilinta 90 mg twice daily, aspirin 81 mg daily 3. Pain Management: Tylenol as needed. Well controlled.  5/16: Abdominal pain-?d/t constipation most likely as resolved after multiple BM's.   5/18: Has had BMs, XR reviewed and shows no acute abnormalities but +presence of gas. Start Simethicone  RUQ [name] , shows fatty liver , HDPD diet 4. Mood: Amantadine 100 mg twice daily             -antipsychotic agents: N/A 5. Neuropsych: This patient is not capable of making decisions on his own behalf. 6. Skin/Wound Care: Routine skin checks 7. Fluids/Electrolytes/Nutrition: Routine in and outs with follow-up chemistries 8.  Dysphagia.  Dysphagia #1 honey thick liquids.   Dietary follow-up as well as speech  therapy 9.  Diabetes mellitus.  Hemoglobin A1c 9.2.  NovoLog 5 units 2 times daily, Lantus insulin 35 units twice daily. Uncontrolled. CBG (last 3)  Recent Labs    04/21/20 1653 04/21/20 2102 04/22/20 0617  GLUCAP 162* 189* 110*  some lability , monitor on increase lantus  5/15: CBGs elevated; increase Lantus to 36U  5/17 sugars elevate throughout the day   -add mealtime novolog, 2u tid to start  5/19: well controlled.  10.  Hypertension.  Cozaar 50 mg daily. Well controlled 5/19 Vitals:   04/21/20 2005 04/22/20 0518  BP: 109/67 114/65  Pulse: 74 67  Resp: 18 16  Temp: 97.8 F (36.6 C) 98.4 F (36.9 C)  SpO2: 98% 97%  running on low side reduce Cozaar to 28m   11.  Hyperlipidemia.  Lipitor  12.  Obesity.  BMI 29.50.  Dietary follow-up  13.  Post stroke shoulder pain - discussed with OT, add Kpad and aspercremetylenol for pain , no imaging needed at this time no trauma   5/16: continue kinesiology tape, muscle crea. continue lidocaine patch.   5/17: reinforced all the above. Keep right arm supported in socket when sitting up.  14. Urine retention--volumes 300-600cc  5/17-OOB to void  -I/O cath prn  -ua neg, ucx pending  -urecholine trial 31m tid 15. Chest pain: EKG obtained and shows NSR. Troponin pending. Likely costochondritis; lidocaine patch applied.  LOS: 6 days A FACE TO FACE EVALUATION WAS PERFORMED  ACharlett Blake5/19/2021, 9:19 AM

## 2020-04-22 NOTE — Progress Notes (Signed)
Occupational Therapy Session Note  Patient Details  Name: Brad Delacruz MRN: [id number] Date of Birth: [birth year]  Today's Date: 04/22/2020 OT Individual Time: 0915-1000 OT Individual Time Calculation (min): 45 min  and Today's Date: 04/22/2020 OT Missed Time: 15 Minutes Missed Time Reason: Nursing care   Short Term Goals: Week 1:  OT Short Term Goal 1 (Week 1): Pt will maintain static sitting balance for 2 minutes with max A. OT Short Term Goal 2 (Week 1): Pt will locate self care items from R side of sink for grooming tasks with min cuing. OT Short Term Goal 3 (Week 1): Pt will perform UB dressing with mod A overall.  Skilled Therapeutic Interventions/Progress Updates:    Pt reclined in bed at time of session in supine, just finished cath from nursing.Interpreter present.  Bed mobility tasks for supine to sit with Max A to guide BLEs and bring UB to an upright position, static sitting balance at EOB noted to be improved this date with pt able to static sit without LOB and decreased pushing. Static sitting balance with close supervision - min guard for ~ 5 minutes.  Sit to stand at [facility] using LUE to pull up, Mod A to stand. Hygiene/grooming tasks at sink level with therapist providing max multimodal cues to attend to tasks on R side for visual scanning. Pt needing assistance for head turn and still appeared to only be looking at midline this session.  Brushing teeth with suction brush with Mod A for thoroughness. Therapist facilitated transfer to standard toilet with Stedy +2 for safety, pt did exhibit R lateral lean and pushing to the R, recommend 2 person assist. Communicated with nursing that staff may use Stedy for toileting. Patient in Paulina chair, alarm on, all needs met and call bell in reach and family present in the room.   Therapy Documentation Precautions:  Precautions Precautions: Fall Restrictions Weight Bearing Restrictions: No General: General OT Amount of Missed Time: 15  Minutes   Therapy/Group: Individual Therapy  Viona Gilmore 04/22/2020, 10:23 AM

## 2020-04-22 NOTE — Progress Notes (Signed)
Team Conference Report to Patient/Family  Team Conference discussion was reviewed with the patient and caregiver, including goals, any changes in plan of care and target discharge date.  Patient and caregiver express understanding and are in agreement.  The patient has a target discharge date of 05/13/20.  [name] 04/22/2020, 2:37 PM Patient ID: Brad Delacruz, male   DOB: Dec 29, 1960, 59 y.o.   MRN: [id number]

## 2020-04-22 NOTE — Progress Notes (Signed)
Speech Language Pathology Daily Session Note  Patient Details  Name: Brad Delacruz MRN: [id number] Date of Birth: 01/24/1961  Today's Date: 04/22/2020 SLP Individual Time: 9379-0240 SLP Individual Time Calculation (min): 43 min  Short Term Goals: Week 1: SLP Short Term Goal 1 (Week 1): Patient will demonstrate sustained attention to functional tasks for 15 minutes with Mod verbal cues for redirection. SLP Short Term Goal 2 (Week 1): Patient will initaite tasks in 75% of opportunities with Min verbal cues. SLP Short Term Goal 3 (Week 1): Patient will vocalize on command in 25% of opportunites with Max A multimodal cues. SLP Short Term Goal 4 (Week 1): Patient will utilize gestures to answer basic yes/no questions in 75% of opportunities with Mod A verbal and visual cues. SLP Short Term Goal 5 (Week 1): Patient will consume current diet with minimal overt s/s of aspiration and Min verbal cues for use of swallowing compensatory strategies. SLP Short Term Goal 6 (Week 1): Patient will consume trials of ice chips with minimal overt s/s of aspiration over 2 sessions and Min verbal cues to assess readiness for repeat MBS.  Skilled Therapeutic Interventions: Skilled ST services focused on swallowing skills. SLP facilitated PO consumption of thin liquids following completion of oral care. Pt consumed x3 ice chips and x3 thin liquids via TSP with no overt s/s aspiration and x2 small cup sips with delayed cough noted on 50% of trials. Pt consumed dys 2 and then dys 3 textured snack, pt demonstrated appropriate oral clearance with dys 2 textures and required liquid wash to remove dys 3 textures, no overt s/s aspiration noted on either texture. SLP upgraded diet to include water protocol with plans for repeat MBS on 5/24 to assess diet advancement.Pt was able to feed himself with min A verbal cues for small bites and right anterior spillage with solids. Pt continues to demonstrate reduced sustained attention,  appears to be impacted by fatigue. Pt demonstrated head shake to indicate "no" in response to yes/no questions (verse just facial expression to indicate no), which was not previously observed by Clinical research associate. Pt was left in room with call bell within reach and bed alarm set. ST recommends to continue skilled ST services.      Pain Pain Assessment Pain Score: 0-No pain  Therapy/Group: Individual Therapy  Kendria Halberg  [facility] 04/22/2020, 4:43 PM

## 2020-04-22 NOTE — Progress Notes (Addendum)
Physical Therapy Session Note  Patient Details  Name: Brad Delacruz MRN: [id number] Date of Birth: 1961-03-30  Today's Date: 04/22/2020 PT Individual Time: 1696-7893 PT Individual Time Calculation (min): 27 min   Short Term Goals: Week 1:  PT Short Term Goal 1 (Week 1): Pt will sit EOB with mod assist up to 1 minute PT Short Term Goal 2 (Week 1): Pt will tolerate sitting OOB in WC up to 2 hours between therapies PT Short Term Goal 3 (Week 1): Pt will transfer to [facility] with max assist of 2 consistently PT Short Term Goal 4 (Week 1): Pt will attend to right visual field 25% of the time  Skilled Therapeutic Interventions/Progress Updates: P   Pt presents R side-lying in bed and agreeable to therapy.  Pt performed scooting to EOB w/ interpreter.  Pt required total A side-lying to sit and then assisted to attain seated balance at EOB.  Pt performed seated balance w/o UE support w/ SBA and occasional cueing for mid-line sitting.  Pt performed down on elbow to right w/ c/o pain, left w/o c/o. Pt also reaching forward outside of BOS w/ interpreter assist although aphasic.   Pt able to push up on left UE.  Pt returned to supine and bed alarm on w/ needs in reach.  Family member present in room.  Therapy Documentation Precautions:  Precautions Precautions: Fall Restrictions Weight Bearing Restrictions: No General:   Vital Signs: Therapy Vitals Temp: 97.6 F (36.4 C) Pulse Rate: 76 Resp: 16 BP: 104/60 Patient Position (if appropriate): Lying Oxygen Therapy SpO2: 97 % O2 Device: Room Air Pain:   6/10 to back.   Therapy/Group: Individual Therapy  Lucio Edward 04/22/2020, 3:55 PM

## 2020-04-23 ENCOUNTER — Inpatient Hospital Stay (HOSPITAL_COMMUNITY): Payer: BC Managed Care – PPO | Admitting: Occupational Therapy

## 2020-04-23 ENCOUNTER — Inpatient Hospital Stay (HOSPITAL_COMMUNITY): Payer: BC Managed Care – PPO | Admitting: Physical Therapy

## 2020-04-23 ENCOUNTER — Inpatient Hospital Stay (HOSPITAL_COMMUNITY): Payer: BC Managed Care – PPO | Admitting: Speech Pathology

## 2020-04-23 LAB — GLUCOSE, CAPILLARY
Glucose-Capillary: 97 mg/dL (ref 70–99)
Glucose-Capillary: 160 mg/dL — ABNORMAL HIGH (ref 70–99)
Glucose-Capillary: 167 mg/dL — ABNORMAL HIGH (ref 70–99)
Glucose-Capillary: 172 mg/dL — ABNORMAL HIGH (ref 70–99)

## 2020-04-23 NOTE — Progress Notes (Signed)
Speech Language Pathology Daily Session Note  Patient Details  Name: Brad Delacruz MRN: [id number] Date of Birth: January 09, 1961  Today's Date: 04/23/2020 SLP Individual Time: 1300-1330 SLP Individual Time Calculation (min): 30 min  Short Term Goals: Week 1: SLP Short Term Goal 1 (Week 1): Patient will demonstrate sustained attention to functional tasks for 15 minutes with Mod verbal cues for redirection. SLP Short Term Goal 2 (Week 1): Patient will initaite tasks in 75% of opportunities with Min verbal cues. SLP Short Term Goal 3 (Week 1): Patient will vocalize on command in 25% of opportunites with Max A multimodal cues. SLP Short Term Goal 4 (Week 1): Patient will utilize gestures to answer basic yes/no questions in 75% of opportunities with Mod A verbal and visual cues. SLP Short Term Goal 5 (Week 1): Patient will consume current diet with minimal overt s/s of aspiration and Min verbal cues for use of swallowing compensatory strategies. SLP Short Term Goal 6 (Week 1): Patient will consume trials of ice chips with minimal overt s/s of aspiration over 2 sessions and Min verbal cues to assess readiness for repeat MBS.  Skilled Therapeutic Interventions: Skilled treatment session focused on communication goals. Upon arrival, patient was consuming his medications, therefore, the water protocol was not administered. Patient also declined trials of upgraded textures. SLP facilitated session by providing Max A multimodal cues for patient to voice on command. Patient was unable to sustain phonation of vowels but was able to elicit voicing while pushing/pulling again the bed rails X 3. Patient also attempted to verbalize a phrase that was unintelligible but voiced ~3 words. Patient left upright in bed with alarm on and all needs within reach. Continue with current plan of care.      Pain    Therapy/Group: Individual Therapy  Brad Delacruz 04/23/2020, 3:18 PM

## 2020-04-23 NOTE — Progress Notes (Signed)
[place] PHYSICAL MEDICINE & REHABILITATION PROGRESS NOTE   Subjective/Complaints:   Pt remains with severe global aphasia Interpreter in room but not interpreting much because patient is aphasic.  Interpreter is mainly interpreting for benefit of patient's wife who is also in the room.  ROS: Limited due to aphasia  Objective:   US Abdomen Limited RUQ  Result Date: 04/21/2020 CLINICAL DATA:  Abdominal pain. EXAM: ULTRASOUND ABDOMEN LIMITED RIGHT UPPER QUADRANT COMPARISON:  None. FINDINGS: Gallbladder: No gallstones or wall thickening visualized (2.5 mm). No sonographic Murphy sign noted by sonographer. Common bile duct: Diameter: 4.2 mm Liver: No focal lesion identified. There is diffusely increased echogenicity of the liver parenchyma. Portal vein is patent on color Doppler imaging with normal direction of blood flow towards the liver. Other: None. IMPRESSION: Fatty liver. Electronically Signed   By: Aram Candela M.D.   On: 04/21/2020 17:56   No results for input(s): WBC, HGB, HCT, PLT in the last 72 hours. No results for input(s): NA, K, CL, CO2, GLUCOSE, BUN, CREATININE, CALCIUM in the last 72 hours.  Intake/Output Summary (Last 24 hours) at 04/23/2020 1606 Last data filed at 04/23/2020 1349 Gross per 24 hour  Intake 720 ml  Output 601 ml  Net 119 ml     Physical Exam: Vital Signs Blood pressure 119/74, pulse 73, temperature 97.9 F (36.6 C), temperature source Oral, resp. rate 18, SpO2 98 %.   General: No acute distress Mood and affect are appropriate Heart: Regular rate and rhythm no rubs murmurs or extra sounds Lungs: Clear to auscultation, breathing unlabored, no rales or wheezes Abdomen: Positive bowel sounds, soft nontender to palpation, nondistended Extremities: No clubbing, cyanosis, or edema Skin: No evidence of breakdown, no evidence of rash   Neurologic: Cranial nerves II through XII intact, motor strength is 5/5 in L nd 0/5 RIght deltoid, bicep, tricep,  grip, hip flexor, knee extensors, ankle dorsiflexor and plantar flexor Sensory exam cannot assess  Musculoskeletal: right shoulder tender with PROM in ER/iR and with ABD.  Mild wincing with R Hip ROM.  1/4" sublux right shoulder.   Assessment/Plan: 1. Functional deficits secondary to Left MCA with R HP and Aphasia  which require 3+ hours per day of interdisciplinary therapy in a comprehensive inpatient rehab setting.  Physiatrist is providing close team supervision and 24 hour management of active medical problems listed below.  Physiatrist and rehab team continue to assess barriers to discharge/monitor patient progress toward functional and medical goals  Care Tool:  Bathing    Body parts bathed by patient: Chest, Abdomen, Front perineal area, Right upper leg, Left upper leg, Right arm, Face   Body parts bathed by helper: Left arm, Right lower leg, Left lower leg, Buttocks     Bathing assist Assist Level: Maximal Assistance - Patient 24 - 49%     Upper Body Dressing/Undressing Upper body dressing   What is the patient wearing?: Pull over shirt    Upper body assist Assist Level: Total Assistance - Patient < 25%    Lower Body Dressing/Undressing Lower body dressing      What is the patient wearing?: Incontinence brief     Lower body assist Assist for lower body dressing: Total Assistance - Patient < 25%     Toileting Toileting    Toileting assist Assist for toileting: 2 Helpers     Transfers Chair/bed transfer  Transfers assist     Chair/bed transfer assist level: 2 Helpers     Locomotion Ambulation   Ambulation  assist   Ambulation activity did not occur: Safety/medical concerns          Walk 10 feet activity   Assist  Walk 10 feet activity did not occur: Safety/medical concerns        Walk 50 feet activity   Assist Walk 50 feet with 2 turns activity did not occur: Safety/medical concerns         Walk 150 feet activity   Assist  Walk 150 feet activity did not occur: Safety/medical concerns         Walk 10 feet on uneven surface  activity   Assist Walk 10 feet on uneven surfaces activity did not occur: Safety/medical concerns         Wheelchair     Assist        Wheelchair assist level: Dependent - Patient 0% Max wheelchair distance: 150 in TIS WC    Wheelchair 50 feet with 2 turns activity    Assist        Assist Level: Dependent - Patient 0%   Wheelchair 150 feet activity     Assist      Assist Level: Dependent - Patient 0%   Blood pressure 119/74, pulse 73, temperature 97.9 F (36.6 C), temperature source Oral, resp. rate 18, SpO2 98 %.  Medical Problem List and Plan: 1.  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  TEE and loop recorder placement to be discussed as outpatient.             -patient may shower             -ELOS/Goals: 20-22 days MinA PT, OT, SLP  Continue CIR . 2.  Antithrombotics: -DVT/anticoagulation: Subcutaneous heparin.  Venous Doppler studies negative             -antiplatelet therapy: Brilinta 90 mg twice daily, aspirin 81 mg daily 3. Pain Management: Tylenol as needed. Well controlled.   Right shoulder pain, sling for comfort Ask OT  to perform FES to R shoulder 4. Mood: Amantadine 100 mg twice daily             -antipsychotic agents: N/A 5. Neuropsych: This patient is not capable of making decisions on his own behalf. 6. Skin/Wound Care: Routine skin checks 7. Fluids/Electrolytes/Nutrition: Routine in and outs with follow-up chemistries 8.  Dysphagia.  Dysphagia #1 honey thick liquids.   Dietary follow-up as well as speech therapy 9.  Diabetes mellitus.  Hemoglobin A1c 9.2.  NovoLog 5 units 2 times daily, Lantus insulin 35 units twice daily. Uncontrolled. CBG (last 3)  Recent Labs    04/22/20 2100 04/23/20 0617 04/23/20 1122   GLUCAP 173* 97 160*  some lability , monitor on increase lantus  5/15: CBGs elevated; increase Lantus to 36U  5/17 sugars elevate throughout the day   -add mealtime novolog, 2u tid to start  5/19: well controlled.  10.  Hypertension.  Cozaar 50 mg daily.   Vitals:   04/23/20 0508 04/23/20 1421  BP: 131/71 119/74  Pulse: 68 73  Resp: 16 18  Temp: 98 F (36.7 C) 97.9 F (36.6 C)  SpO2: 99% 98%  Controlled, 5/20  11.  Hyperlipidemia.  Lipitor 12.  Obesity.  BMI 29.50.  Dietary follow-up  13.  Post stroke shoulder pain - discussed with OT, add Kpad and aspercreme, tylenol for pain , no imaging needed at this time no  trauma   . Keep right arm supported in a sling 14. Urine retention--volumes 300-600cc  5/17-OOB to void  -I/O cath prn  -ua neg, ucx pending  -urecholine trial 10mg  tid And Flomax 15. Chest pain: EKG obtained and shows NSR. Troponin pending. Likely costochondritis; lidocaine patch applied.  LOS: 7 days A FACE TO FACE EVALUATION WAS PERFORMED  Charlett Blake 04/23/2020, 4:06 PM

## 2020-04-23 NOTE — Progress Notes (Addendum)
Patient ID: Brad Delacruz, male   DOB: 1961/11/01, 59 y.o.   MRN: [id number]  Met with patient and wife to review role and barriers to discharge. Spoke with daughter via telephone as well to review urinary retention, I+O cath and bladder scan volume assessment and initiation of medication to address retention. Reviewed dysphagia dietary restrictions and MBS that would be schedued for 04/27/20. Also reviewed pain in left shoulder and plans to address discomfort. Daughter noted that patient was concerned about visual deficit in left eye"reports he cannot see out of his left eye, therapy mentioned an eye patch and or glasses on acute  Noted that this concern would be passed along to the OT and MD for follow up. Stated an understanding of the information reviewed.

## 2020-04-23 NOTE — Progress Notes (Signed)
Occupational Therapy Session Note  Patient Details  Name: Brad Delacruz MRN: [id number] Date of Birth: 1960-12-15  Today's Date: 04/23/2020 OT Individual Time: 0920-1000 OT Individual Time Calculation (min): 40 min    Short Term Goals: Week 1:  OT Short Term Goal 1 (Week 1): Pt will maintain static sitting balance for 2 minutes with max A. OT Short Term Goal 2 (Week 1): Pt will locate self care items from R side of sink for grooming tasks with min cuing. OT Short Term Goal 3 (Week 1): Pt will perform UB dressing with mod A overall.  Skilled Therapeutic Interventions/Progress Updates:    Pt seated up in TIS chair at time of session, interpreter in room. Pt participated in sit to stands x3 at elevated table with instructions to push up through LUE in order to come up to a standing position, Max Ax2 for sit to stands with therapist assist to block knee and foot in RLE. Facilitated weight bearing through RUE at table level to improve body awareness, sensation, and functional use. Static standing balance requiring Max Ax2 this date. Visual scanning activities in standing with using LUE to reach for objects with visual contrast, cross midline, and place items on R side of his body to improve visual scanning techniques for ADLs and decrease R side neglect. Pt noted to have difficulty scanning to R side, often closing both eyes. Verbal/tactile cues provided throughout standing activity for proper posture and body alignment. Pt returned to room, squat pivot transfer w/c > bed with total assist from therapist, once seated at EOB pt without LOB and able to maintain upright sitting. Returned to supine, HOB elevated. Pt resting in bed, alarm in place, all needs met, family in room.  Therapy Documentation Precautions:  Precautions Precautions: Fall Restrictions Weight Bearing Restrictions: No  Therapy/Group: Individual Therapy  Viona Gilmore 04/23/2020, 10:20 AM

## 2020-04-23 NOTE — Progress Notes (Signed)
Occupational Therapy Session Note  Patient Details  Name: Brad Delacruz MRN: [id number] Date of Birth: July 24, 1961  Today's Date: 04/23/2020 OT Individual Time: 6861-6837 OT Individual Time Calculation (min): 38 min    Short Term Goals: Week 2:    Week 1:  OT Short Term Goal 1 (Week 1): Pt will maintain static sitting balance for 2 minutes with max A. OT Short Term Goal 2 (Week 1): Pt will locate self care items from R side of sink for grooming tasks with min cuing. OT Short Term Goal 3 (Week 1): Pt will perform UB dressing with mod A overall.  Skilled Therapeutic Interventions/Progress Updates:    Pt seated up in TIS chair at time of session, interpreter and wife in the room. Max A for sit to stand at [employer], doffed shirt in prep for shower with Max A, pt did assist by trying to undo buttons and pulling off of LUE, DEP for doffing brief and pants in preparation for shower. Pt transferred via Stedy to roll in shower chair. UB bathing with Min A to wash LUE (given hemiparesis in RUE), pt able to wash his core/chest/face after mod verbal instructions to wash, showing improved initiation of task in a familiar shower setting. Max A for LB bathing to thoroughly wash below knees and buttocks/periarea. Pt having BM while in shower and needing total A for hygiene. DEP for donning brief but pt able to static stand at [facility] with therapist assist to don over hips. Stedy back to bed, returned to supine with Min A to manage RLE, pt able to manage LLE and UB with extended time. Donned hospital gown with hemidressing techniques to dress RUE first. Pt left at bed level with all needs met, family in room, call bell within reach.    Therapy Documentation Precautions:  Precautions Precautions: Fall Restrictions Weight Bearing Restrictions: No   Therapy/Group: Individual Therapy  Viona Gilmore 04/23/2020, 2:56 PM

## 2020-04-23 NOTE — Progress Notes (Signed)
Physical Therapy Session Note  Patient Details  Name: Brad Delacruz MRN: [id number] Date of Birth: 05/05/1961  Today's Date: 04/23/2020 PT Individual Time: 0800-0912 PT Individual Time Calculation (min): 72 min   Short Term Goals: Week 1:  PT Short Term Goal 1 (Week 1): Pt will sit EOB with mod assist up to 1 minute PT Short Term Goal 2 (Week 1): Pt will tolerate sitting OOB in WC up to 2 hours between therapies PT Short Term Goal 3 (Week 1): Pt will transfer to [facility] with max assist of 2 consistently PT Short Term Goal 4 (Week 1): Pt will attend to right visual field 25% of the time  Skilled Therapeutic Interventions/Progress Updates:   Pt received supine in bed and agreeable to PT. Pt assisted pt to don pants in bed at supine level for safety. Min assist rolling R and max assist rolling R. Supine>sit transfer with mod assist with pull into sitting through L UE. Sitting balance EOB with intermittent min-total A to attempt to don shoes EOB. SB transfer to [facility] with max assist to the R. Pt transported to rehab gym. SB transfer with mod assist to the R with max cues for initiation and sequencing.   Sitting balance EOB with visual feedback from mirror. Lateral reaches L to force weight shift L with min-mod assist to improved LE position and R side trunkal alignment and activation. Pt then performed cross body reaches to the R using LUE, able to perform partial lateral lean R, and maintain balance 50% of the time with only min assist.   Sit<>supine with mod assist on the R for LUE/LLE management; mild shoulder pain in transitions. Supine NMR: AAROM hip/knee flexion/extension x 15 BLE, bridge with mod assist x 10, lumbar/pelvic rotation in hooklying x 8 Bil. Rolling R and L x 5 bil to improve trunkal activation and body awareness. Trace R hip extensor activation noted in funcitonal movements, no volitional activation noted.   SB transfer back to [facility] on the R with max assist and max cues for  positioning, timing, sequencing, improved anterior weight shift and pelvic translation. Pt transported to day room.   Kinetron reciprocal movement training 4 x 1 min to perform BLE marches, trance hip extension noted on the RLE. Prolonged rest breaks between bouts as pt demonstrating significant fatigue.   Patient returned to room and left sitting in [facility] with call bell in reach and all needs met.          Therapy Documentation Precautions:  Precautions Precautions: Fall Restrictions Weight Bearing Restrictions: No Vital Signs: Therapy Vitals Temp: 97.9 F (36.6 C) Temp Source: Oral Pulse Rate: 73 Resp: 18 BP: 119/74 Oxygen Therapy SpO2: 98 % O2 Device: Room Air Pain: Faces: hurts a little. Givmorh applied to R shoulder to manage subluxation.       Therapy/Group: Individual Therapy  Lorie Phenix 04/23/2020, 5:52 PM

## 2020-04-24 ENCOUNTER — Inpatient Hospital Stay (HOSPITAL_COMMUNITY): Payer: BC Managed Care – PPO | Admitting: Speech Pathology

## 2020-04-24 ENCOUNTER — Inpatient Hospital Stay (HOSPITAL_COMMUNITY): Payer: BC Managed Care – PPO | Admitting: Physical Therapy

## 2020-04-24 ENCOUNTER — Inpatient Hospital Stay (HOSPITAL_COMMUNITY): Payer: BC Managed Care – PPO | Admitting: Occupational Therapy

## 2020-04-24 LAB — GLUCOSE, CAPILLARY
Glucose-Capillary: 75 mg/dL (ref 70–99)
Glucose-Capillary: 213 mg/dL — ABNORMAL HIGH (ref 70–99)
Glucose-Capillary: 174 mg/dL — ABNORMAL HIGH (ref 70–99)
Glucose-Capillary: 172 mg/dL — ABNORMAL HIGH (ref 70–99)

## 2020-04-24 MED ORDER — INSULIN GLARGINE 100 UNIT/ML ~~LOC~~ SOLN
34.0000 [IU] | Freq: Two times a day (BID) | SUBCUTANEOUS | Status: DC
  Administered 2020-04-24 – 2020-05-13 (×38): 34 [IU] via SUBCUTANEOUS
  Filled 2020-04-24 (×40): qty 0.34

## 2020-04-24 NOTE — Progress Notes (Signed)
Occupational Therapy Session Note  Patient Details  Name: Brad Delacruz MRN: [id number] Date of Birth: 1961-06-02  Today's Date: 04/24/2020 OT Individual Time: 4098-1191 OT Individual Time Calculation (min): 59 min   Short Term Goals: Week 1:  OT Short Term Goal 1 (Week 1): Pt will maintain static sitting balance for 2 minutes with max A. OT Short Term Goal 2 (Week 1): Pt will locate self care items from R side of sink for grooming tasks with min cuing. OT Short Term Goal 3 (Week 1): Pt will perform UB dressing with mod A overall.  Skilled Therapeutic Interventions/Progress Updates:    Pt greeted in the TIS via SLP handoff. Interpretor and family present. Pt shaking head when asked if he wanted to use the bathroom or shower today. Per family, he is inconsistent with accuracy of yes/no responses. Mod A for sit<stand in [place] with increased time for pt to initiate forward weight shift. He transferred to the roll in shower chair with CGA for sitting balance and instruction to hold onto Rt wrist for joint protection. While standing in [place], pt assisted with lowering his clothing on the Lt side with instruction. Pt also unbuttoned his shirt himself. He then bathed in the shower. Note that pt initiated washing his face, chest, abdomen, and frontal periarea. Mod HOH to thoroughly wash his Rt UE. Tried to wash his Lt underarm using the affected UE, however pt grimaced in pain, therefore OT just washed his underarm without HOH. Max A for LB bathing with pt having incontinent BM in shower. Max A for LB dressing afterwards sit<stand using Stedy from TIS. Pt able to pull pants up from calf to thigh on the Lt side. Mod A for sit<stand in Stedy to pull up LB garments. Total A for footwear and Max A for donning button up shirt. Advised his spouse to bring in pullover shirts to increase his functional independence with UB dressing. At end of session pt was reclined for comfort and safety in TIS with Rt arm  supported and elevated in sling. Left him in care of RN for transfer back to bed for bladder scan. Tx focus placed on praxis, NMR, sitting balance, postural control, sit<stands, and ADL retraining.   Therapy Documentation Precautions:  Precautions Precautions: Fall Restrictions Weight Bearing Restrictions: No Pain: Pt grimacing when Rt UE was moved at times, used positioning strategies during and at end of tx to minimize s/s pain. RN aware to replace the pain patch for his shoulder.  Pain Assessment Pain Scale: Faces Pain Score: 0-No pain Faces Pain Scale: Hurts little more Pain Type: Acute pain Pain Location: Head Pain Descriptors / Indicators: Aching Pain Intervention(s): Other (Comment)(pt declined pain meds) ADL:       Therapy/Group: Individual Therapy  Lois Slagel A Casen Pryor 04/24/2020, 11:48 AM

## 2020-04-24 NOTE — Progress Notes (Signed)
[place] PHYSICAL MEDICINE & REHABILITATION PROGRESS NOTE   Subjective/Complaints:   Severe global aphasia.  Family asking about visual problems.  We discussed visual deficits related to CVA Patient starting with speech therapy this morning  ROS: Limited due to aphasia  Objective:   No results found. No results for input(s): WBC, HGB, HCT, PLT in the last 72 hours. No results for input(s): NA, K, CL, CO2, GLUCOSE, BUN, CREATININE, CALCIUM in the last 72 hours.  Intake/Output Summary (Last 24 hours) at 04/24/2020 0952 Last data filed at 04/24/2020 0205 Gross per 24 hour  Intake 360 ml  Output 600 ml  Net -240 ml     Physical Exam: Vital Signs Blood pressure 112/62, pulse 66, temperature 97.6 F (36.4 C), temperature source Oral, resp. rate 19, SpO2 99 %.  General: No acute distress Mood and affect are appropriate Heart: Regular rate and rhythm no rubs murmurs or extra sounds Lungs: Clear to auscultation, breathing unlabored, no rales or wheezes Abdomen: Positive bowel sounds, soft nontender to palpation, nondistended Extremities: No clubbing, cyanosis, or edema Skin: No evidence of breakdown, no evidence of rash   Neurologic: Cranial nerves II through XII intact, motor strength is 5/5 in L nd 0/5 RIght deltoid, bicep, tricep, grip, hip flexor, knee extensors, ankle dorsiflexor and plantar flexor Sensory exam cannot assess  Musculoskeletal: right shoulder tender with PROM in ER/iR and with ABD. 1/4" sublux right shoulder.   Assessment/Plan: 1. Functional deficits secondary to Left MCA with R HP and Aphasia  which require 3+ hours per day of interdisciplinary therapy in a comprehensive inpatient rehab setting.  Physiatrist is providing close team supervision and 24 hour management of active medical problems listed below.  Physiatrist and rehab team continue to assess barriers to discharge/monitor patient progress toward functional and medical goals  Care  Tool:  Bathing    Body parts bathed by patient: Chest, Abdomen, Front perineal area, Right upper leg, Left upper leg, Right arm, Face   Body parts bathed by helper: Left arm, Right lower leg, Left lower leg, Buttocks     Bathing assist Assist Level: Maximal Assistance - Patient 24 - 49%     Upper Body Dressing/Undressing Upper body dressing   What is the patient wearing?: Pull over shirt    Upper body assist Assist Level: Total Assistance - Patient < 25%    Lower Body Dressing/Undressing Lower body dressing      What is the patient wearing?: Incontinence brief     Lower body assist Assist for lower body dressing: Total Assistance - Patient < 25%     Toileting Toileting    Toileting assist Assist for toileting: 2 Helpers     Transfers Chair/bed transfer  Transfers assist     Chair/bed transfer assist level: Maximal Assistance - Patient 25 - 49%     Locomotion Ambulation   Ambulation assist   Ambulation activity did not occur: Safety/medical concerns          Walk 10 feet activity   Assist  Walk 10 feet activity did not occur: Safety/medical concerns        Walk 50 feet activity   Assist Walk 50 feet with 2 turns activity did not occur: Safety/medical concerns         Walk 150 feet activity   Assist Walk 150 feet activity did not occur: Safety/medical concerns         Walk 10 feet on uneven surface  activity   Assist Walk 10 feet  on uneven surfaces activity did not occur: Safety/medical Building services engineer assist level: Dependent - Patient 0% Max wheelchair distance: 150 in TIS WC    Wheelchair 50 feet with 2 turns activity    Assist        Assist Level: Dependent - Patient 0%   Wheelchair 150 feet activity     Assist      Assist Level: Dependent - Patient 0%   Blood pressure 112/62, pulse 66, temperature 97.6 F (36.4 C), temperature source Oral, resp.  rate 19, SpO2 99 %.  Medical Problem List and Plan: 1.  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  TEE and loop recorder placement to be discussed as outpatient.             -patient may shower             -ELOS/Goals: 20-22 days MinA PT, OT, SLP  Continue CIR . 2.  Antithrombotics: -DVT/anticoagulation: Subcutaneous heparin.  Venous Doppler studies negative             -antiplatelet therapy: Brilinta 90 mg twice daily, aspirin 81 mg daily 3. Pain Management: Tylenol as needed. Well controlled.   Right shoulder pain, sling for comfort Ask OT  to perform FES to R shoulder 4. Mood: Amantadine 100 mg twice daily             -antipsychotic agents: N/A 5. Neuropsych: This patient is not capable of making decisions on his own behalf. 6. Skin/Wound Care: Routine skin checks 7. Fluids/Electrolytes/Nutrition: Routine in and outs with follow-up chemistries 8.  Dysphagia.  Dysphagia #1 honey thick liquids.   Dietary follow-up as well as speech therapy 9.  Diabetes mellitus.  Hemoglobin A1c 9.2.  NovoLog 5 units 2 times daily, Lantus insulin 35 units twice daily. Uncontrolled. CBG (last 3)  Recent Labs    04/23/20 1602 04/23/20 2100 04/24/20 0607  GLUCAP 167* 172* 75  some lability , monitor on increase lantus  5/15: CBGs elevated; increase Lantus to 36U  5/17 sugars elevate throughout the day   -add mealtime novolog, 2u tid to start A.m. CBG on low side will reduce Lantus 10.  Hypertension.  Cozaar 50 mg daily.   Vitals:   04/23/20 2007 04/24/20 0416  BP: 112/61 112/62  Pulse: 80 66  Resp: 18 19  Temp: 98.4 F (36.9 C) 97.6 F (36.4 C)  SpO2: 97% 99%  Controlled, 5/21  11.  Hyperlipidemia.  Lipitor 12.  Obesity.  BMI 29.50.  Dietary follow-up  13.  Post stroke shoulder pain - discussed with OT, add Kpad and aspercreme, tylenol for pain , no imaging needed at this time no  trauma   . Keep right arm supported in a sling 14. Urine retention--volumes 300-600cc  5/17-OOB to void  -I/O cath prn  -ua neg, ucx pending  -urecholine trial 10mg  tid And Flomax 15. Chest pain: EKG obtained and shows NSR. Troponin pending. Likely costochondritis; lidocaine patch applied.  Because of aphasia difficult to evaluate pain complaints however most consistent pain that he has been experiencing has been right shoulder pain LOS: 8 days A FACE TO FACE EVALUATION WAS PERFORMED  04/24/2020, 9:52 AM

## 2020-04-24 NOTE — Progress Notes (Signed)
Speech Language Pathology Weekly Progress Note  Patient Details  Name: Brad Delacruz MRN: [id number] Date of Birth: 05-06-1961  Beginning of progress report period: 04/17/2020 End of progress report period: 04/24/2020    Short Term Goals: Week 1: SLP Short Term Goal 1 (Week 1): Patient will demonstrate sustained attention to functional tasks for 15 minutes with Mod verbal cues for redirection. SLP Short Term Goal 1 - Progress (Week 1): Progressing toward goal SLP Short Term Goal 2 (Week 1): Patient will initaite tasks in 75% of opportunities with Min verbal cues. SLP Short Term Goal 2 - Progress (Week 1): Met SLP Short Term Goal 3 (Week 1): Patient will vocalize on command in 25% of opportunites with Max A multimodal cues. SLP Short Term Goal 3 - Progress (Week 1): Progressing toward goal SLP Short Term Goal 4 (Week 1): Patient will utilize gestures to answer basic yes/no questions in 75% of opportunities with Mod A verbal and visual cues. SLP Short Term Goal 4 - Progress (Week 1): Met SLP Short Term Goal 5 (Week 1): Patient will consume current diet with minimal overt s/s of aspiration and Min verbal cues for use of swallowing compensatory strategies. SLP Short Term Goal 5 - Progress (Week 1): Met SLP Short Term Goal 6 (Week 1): Patient will consume trials of ice chips with minimal overt s/s of aspiration over 2 sessions and Min verbal cues to assess readiness for repeat MBS. SLP Short Term Goal 6 - Progress (Week 1): Met    New Short Term Goals: Week 2: SLP Short Term Goal 1 (Week 2): Patient will demonstrate sustained attention to functional tasks for 15 minutes with Mod verbal cues for redirection. SLP Short Term Goal 2 (Week 2): Patient will complete basic, familiar tasks with mod assist for functional problem solving. SLP Short Term Goal 3 (Week 2): Patient will vocalize on command in 25% of opportunites with Max A multimodal cues. SLP Short Term Goal 4 (Week 2): Patient will  utilize gestures to answer basic yes/no questions in 75% of opportunities with Min A verbal and visual cues. SLP Short Term Goal 5 (Week 2): Patient will consume current diet with minimal overt s/s of aspiration and supervision verbal cues for use of swallowing compensatory strategies. SLP Short Term Goal 6 (Week 2): Patient will consume trials of thin liquids via cup sips with minimal overt s/s of aspiration and Min verbal cues for use of swallowing precautions  Weekly Progress Updates:   Pt has made functional gains this reporting period and has met 4 out of 6 short term goals.  Pt is currently max assist for tasks due to global aphasia with apraxia and moderately severe cognitive deficits.  Pt has demonstrated improved task initiation and toleration of trials of ice chips and small amounts of thin liquids.  Pt is consuming a dys 1, honey thick liquids diet with min cues for use of swallowing precautions and has been started on the water protocol to continue working towards diet progression.  Pt and family education is ongoing.  Pt would continue to benefit from skilled ST while inpatient in order to maximize functional independence and reduce burden of care prior to discharge.  Anticipate that pt will need 24/7 supervision at discharge in addition to [place] follow up at next level of care    Intensity: Minumum of 1-2 x/day, 30 to 90 minutes Frequency: 3 to 5 out of 7 days Duration/Length of Stay: 3-4 weeks Treatment/Interventions: Cognitive remediation/compensation;Dysphagia/aspiration precaution training;Internal/external aids;Speech/Language facilitation;Therapeutic Activities;Environmental  controls;Cueing hierarchy;Functional tasks;Patient/family education    Emmah Bratcher, Selinda Orion 04/24/2020, 10:18 AM

## 2020-04-24 NOTE — Plan of Care (Signed)
  Problem: Consults Goal: RH STROKE PATIENT EDUCATION Description: See Patient Education module for education specifics  Outcome: Progressing Goal: Nutrition Consult-if indicated Outcome: Progressing Goal: Diabetes Guidelines if Diabetic/Glucose > 140 Description: If diabetic or lab glucose is > 140 mg/dl - Initiate Diabetes/Hyperglycemia Guidelines & Document Interventions  Outcome: Progressing   Problem: RH BLADDER ELIMINATION Goal: RH STG MANAGE BLADDER WITH ASSISTANCE Description: STG Manage Bladder With min Assistance Outcome: Progressing Flowsheets (Taken 04/24/2020 1220) STG: Pt will manage bladder with assistance: 1-Total assistance   Problem: RH SAFETY Goal: RH STG ADHERE TO SAFETY PRECAUTIONS W/ASSISTANCE/DEVICE Description: STG Adhere to Safety Precautions With supervision Assistance/Device. Outcome: Progressing Flowsheets (Taken 04/24/2020 1220) STG:Pt will adhere to safety precautions with assistance/device: 3-Moderate assistance   Problem: RH COGNITION-NURSING Goal: RH STG USES MEMORY AIDS/STRATEGIES W/ASSIST TO PROBLEM SOLVE Description: STG Uses Memory Aids/Strategies With min Assistance to Problem Solve. Outcome: Progressing   Problem: RH KNOWLEDGE DEFICIT Goal: RH STG INCREASE KNOWLEDGE OF DIABETES Description: Pt/family will demonstrate understanding of DM management with min assist using handout/booklets in spanish and assistance with interrupter prior to DC Outcome: Progressing Goal: RH STG INCREASE KNOWLEDGE OF HYPERTENSION Description: Pt/family will demonstrate understanding of HTN management with min assist using handout/booklets in spanish and assistance with interrupter prior to DC Outcome: Progressing Goal: RH STG INCREASE KNOWLEDGE OF DYSPHAGIA/FLUID INTAKE Description: Pt/family will demonstrate understanding of dysphagia management with min assist using handout/booklets in spanish and assistance with interrupter prior to DC Outcome:  Progressing Goal: RH STG INCREASE KNOWLEDGE OF STROKE PROPHYLAXIS Description: Pt/family will demonstrate understanding of stroke prevention with min assist using handout/booklets in spanish and assistance with interrupter prior to DC Outcome: Progressing

## 2020-04-24 NOTE — Progress Notes (Signed)
Orthopedic Tech Progress Note Patient Details:  Brad Delacruz 08/28/1961 [id number] Patient was working with THERAPY when I went to apply SHOULDER SLING but the THERAPIST said OT would be in soon and they would apply it to patient. Ortho Devices Type of Ortho Device: Sling immobilizer Ortho Device/Splint Location: RUE Ortho Device/Splint Interventions: Ordered, Application   Post Interventions Patient Tolerated: Other (comment) Instructions Provided: Other (comment), Care of device   Donald Pore 04/24/2020, 9:24 AM

## 2020-04-24 NOTE — Progress Notes (Signed)
Physical Therapy Session Note  Patient Details  Name: Brad Delacruz MRN: [id number] Date of Birth: Feb 25, 1961  Today's Date: 04/24/2020 PT Individual Time: 1300-1410 PT Individual Time Calculation (min): 70 min   Short Term Goals: Week 1:  PT Short Term Goal 1 (Week 1): Pt will sit EOB with mod assist up to 1 minute PT Short Term Goal 2 (Week 1): Pt will tolerate sitting OOB in WC up to 2 hours between therapies PT Short Term Goal 3 (Week 1): Pt will transfer to [facility] with max assist of 2 consistently PT Short Term Goal 4 (Week 1): Pt will attend to right visual field 25% of the time  Skilled Therapeutic Interventions/Progress Updates:   Pt received supine in bed and agreeable to PT. Supine>sit transfer with mod assist and on the R of bed.   Sitting balance EOB with min assist to set up SB transfer. SB transfer to the R with mod assist and RLE blocked. Pt transported to rail outside rehab gym. Sit<>stand at rail x 5 with max assist for safety. Standing balance/tolerance 5 x 1 minute with  Max assist to block the RLE to prevent lateral LOB and manual facilitation to improved postural alignment.   SB transfer to mat table with max assist to R. Sitting balance EOB with min assist for safety. Pt performed cross body reach to the R with LUE to grab horse shoe then toss to target. Max cues for visual scanning and proper engagement in task. Pt then performed lateral reach to the L with LUE with min assist and then toss to target. Kicking ball x 10 LLE and with assit from PT x 5 LLE.   Pt returned to room and performed SB transfer to bed with max assist on the R. Sit>supine completed with mod assist for RLE management and left supine in bed with call bell in reach and all needs met.        Therapy Documentation Precautions:  Precautions Precautions: Fall Restrictions Weight Bearing Restrictions: No    Vital Signs: Therapy Vitals Temp: 98.2 F (36.8 C) Pulse Rate: 97 Resp: 16 BP:  121/72 Patient Position (if appropriate): Lying Oxygen Therapy SpO2: 97 % O2 Device: Room Air Pain:   none   Therapy/Group: Individual Therapy  Lorie Phenix 04/24/2020, 5:34 PM

## 2020-04-24 NOTE — Progress Notes (Signed)
Speech Language Pathology Daily Session Note  Patient Details  Name: Brad Delacruz MRN: [id number] Date of Birth: 05/13/1961  Today's Date: 04/24/2020 SLP Individual Time: 0805-0900 SLP Individual Time Calculation (min): 55 min  Short Term Goals: Week 1: SLP Short Term Goal 1 (Week 1): Patient will demonstrate sustained attention to functional tasks for 15 minutes with Mod verbal cues for redirection. SLP Short Term Goal 2 (Week 1): Patient will initaite tasks in 75% of opportunities with Min verbal cues. SLP Short Term Goal 3 (Week 1): Patient will vocalize on command in 25% of opportunites with Max A multimodal cues. SLP Short Term Goal 4 (Week 1): Patient will utilize gestures to answer basic yes/no questions in 75% of opportunities with Mod A verbal and visual cues. SLP Short Term Goal 5 (Week 1): Patient will consume current diet with minimal overt s/s of aspiration and Min verbal cues for use of swallowing compensatory strategies. SLP Short Term Goal 6 (Week 1): Patient will consume trials of ice chips with minimal overt s/s of aspiration over 2 sessions and Min verbal cues to assess readiness for repeat MBS.  Skilled Therapeutic Interventions:  Pt was seen for skilled ST targeting goals for communication and dysphagia.  Upon arrival, pt was in bed, awake, alert, and indicated via head nod that he wanted to get out of bed for therapy.  Pt followed 1 step commands to assist in transfer with min assist but needed mod-max assist to receptively identify an object when named from a field of two.  Pt was able to smile on command x1 but had difficulty replicating other non-speech oral motor movements like blowing a kiss or sticking out his tongue.  When singing in unison with therapist, pt was able to verbalize "ti" in "Feliz cumpleanos a ti" x1.  Pt was dysphonic but his utterance was intelligible in context and he became tearful after vocalizing.  Therapist also facilitated the session with  trials of ice chips and thin liquids to continue working towards diet progression.  Pt had immediate coughing with trials of cup sips of thin liquids, even when size was relatively small.  No overt s/s of aspiration with teaspoons of thin liquids or ice chips.  Recommend that pt continue trials of water per the water protocol in preparation for repeat MBS next week.  Pt left in wheelchair with chair alarm set, family and multiple caregivers at bedside.  Continue per current plan of care.    Pain Pain Assessment Pain Scale: Faces Faces Pain Scale: Hurts little more Pain Type: Acute pain Pain Location: Head Pain Descriptors / Indicators: Aching Pain Intervention(s): Other (Comment)(pt declined pain meds)  Therapy/Group: Individual Therapy  Brad Delacruz, Brad Delacruz 04/24/2020, 10:14 AM

## 2020-04-25 LAB — GLUCOSE, CAPILLARY
Glucose-Capillary: 109 mg/dL — ABNORMAL HIGH (ref 70–99)
Glucose-Capillary: 173 mg/dL — ABNORMAL HIGH (ref 70–99)
Glucose-Capillary: 88 mg/dL (ref 70–99)
Glucose-Capillary: 184 mg/dL — ABNORMAL HIGH (ref 70–99)

## 2020-04-25 NOTE — Progress Notes (Signed)
Brad Delacruz PHYSICAL MEDICINE & REHABILITATION PROGRESS NOTE   Subjective/Complaints:   Pt attempting to vocalize, but no words understood- wife reports he's not complaining of pain and bowels "ok"-   ROS: limited due to aphasia/language  Objective:   No results found. No results for input(s): WBC, HGB, HCT, PLT in the last 72 hours. No results for input(s): NA, K, CL, CO2, GLUCOSE, BUN, CREATININE, CALCIUM in the last 72 hours.  Intake/Output Summary (Last 24 hours) at 04/25/2020 1647 Last data filed at 04/25/2020 1320 Gross per 24 hour  Intake 840 ml  Output -  Net 840 ml     Physical Exam: Vital Signs Blood pressure 116/86, pulse 73, temperature 98.2 F (36.8 C), resp. rate 16, SpO2 100 %.  General: No acute distress- sitting up in bed; wife at bedside, NAD Mood and affect are appropriate Heart: RRR< no MR/G, no JVD Lungs: CTA B/L- no W/R/R- good air movement Abdomen: Soft, NT, ND, (+)BS  Extremities: No clubbing, cyanosis, or edema Skin: No evidence of breakdown, no evidence of rash   Neurologic: Cranial nerves II through XII intact, motor strength is 5/5 in L nd 0/5 RIght deltoid, bicep, tricep, grip, hip flexor, knee extensors, ankle dorsiflexor and plantar flexor Sensory exam cannot assess  Musculoskeletal: right shoulder tender with PROM in ER/iR and with ABD. 1/4" sublux right shoulder.   Assessment/Plan: 1. Functional deficits secondary to Left MCA with R HP and Aphasia  which require 3+ hours per day of interdisciplinary therapy in a comprehensive inpatient rehab setting.  Physiatrist is providing close team supervision and 24 hour management of active medical problems listed below.  Physiatrist and rehab team continue to assess barriers to discharge/monitor patient progress toward functional and medical goals  Care Tool:  Bathing    Body parts bathed by patient: Chest, Abdomen, Front perineal area, Right upper leg, Left upper leg, Face   Body parts  bathed by helper: Right arm, Left arm, Buttocks, Right lower leg, Left lower leg     Bathing assist Assist Level: Maximal Assistance - Patient 24 - 49%     Upper Body Dressing/Undressing Upper body dressing   What is the patient wearing?: Pull over shirt    Upper body assist Assist Level: Maximal Assistance - Patient 25 - 49%    Lower Body Dressing/Undressing Lower body dressing      What is the patient wearing?: Incontinence brief, Pants     Lower body assist Assist for lower body dressing: Total Assistance - Patient < 25%     Toileting Toileting    Toileting assist Assist for toileting: Dependent - Patient 0%(using Stedy lift)     Transfers Chair/bed transfer  Transfers assist     Chair/bed transfer assist level: Maximal Assistance - Patient 25 - 49%     Locomotion Ambulation   Ambulation assist   Ambulation activity did not occur: Safety/medical concerns          Walk 10 feet activity   Assist  Walk 10 feet activity did not occur: Safety/medical concerns        Walk 50 feet activity   Assist Walk 50 feet with 2 turns activity did not occur: Safety/medical concerns         Walk 150 feet activity   Assist Walk 150 feet activity did not occur: Safety/medical concerns         Walk 10 feet on uneven surface  activity   Assist Walk 10 feet on uneven surfaces activity did  not occur: Safety/medical Chief of Staff assist level: Dependent - Patient 0% Max wheelchair distance: 150 in TIS WC    Wheelchair 50 feet with 2 turns activity    Assist        Assist Level: Dependent - Patient 0%   Wheelchair 150 feet activity     Assist      Assist Level: Dependent - Patient 0%   Blood pressure 116/86, pulse 73, temperature 98.2 F (36.8 C), resp. rate 16, SpO2 100 %.  Medical Problem List and Plan: 1.  Right side weakness and aphasia secondary to left MCA infarct due  to left M1 occlusion status post TPA and IR with M1 stenting and revascularization.  TEE and loop recorder placement to be discussed as outpatient.             -patient may shower             -ELOS/Goals: 20-22 days MinA PT, OT, SLP  Continue CIR 2.  Antithrombotics: -DVT/anticoagulation: Subcutaneous heparin.  Venous Doppler studies negative             -antiplatelet therapy: Brilinta 90 mg twice daily, aspirin 81 mg daily 3. Pain Management: Tylenol as needed. Well controlled.   Right shoulder pain, sling for comfort Ask OT  to perform FES to R shoulder  5/22- denies pain- con't regimen 4. Mood: Amantadine 100 mg twice daily             -antipsychotic agents: N/A 5. Neuropsych: This patient is not capable of making decisions on his own behalf. 6. Skin/Wound Care: Routine skin checks 7. Fluids/Electrolytes/Nutrition: Routine in and outs with follow-up chemistries 8.  Dysphagia.  Dysphagia #1 honey thick liquids.   Dietary follow-up as well as speech therapy 9.  Diabetes mellitus.  Hemoglobin A1c 9.2.  NovoLog 5 units 2 times daily, Lantus insulin 35 units twice daily. Uncontrolled. CBG (last 3)  Recent Labs    04/24/20 2118 04/25/20 0604 04/25/20 1143  GLUCAP 172* 109* 173*  some lability , monitor on increase lantus  5/15: CBGs elevated; increase Lantus to 36U  5/17 sugars elevate throughout the day   -add mealtime novolog, 2u tid to start A.m. CBG on low side will reduce Lantus  5/22- BGs 109-173- adequate, not great- will monitor if stays the same, will increase insulin slightly 10.  Hypertension.  Cozaar 50 mg daily.   Vitals:   04/25/20 0557 04/25/20 1438  BP: 111/67 116/86  Pulse: 75 73  Resp: 18 16  Temp: 97.6 F (36.4 C) 98.2 F (36.8 C)  SpO2: 100% 100%  \5/22- controlled- con't regimen  11.  Hyperlipidemia.  Lipitor 12.  Obesity.  BMI 29.50.  Dietary follow-up  13.  Post stroke shoulder pain - discussed with OT, add Kpad and aspercreme, tylenol for pain , no  imaging needed at this time no trauma   . Keep right arm supported in a sling 14. Urine retention--volumes 300-600cc  5/17-OOB to void  -I/O cath prn  -ua neg, ucx pending  -urecholine trial 10mg  tid And Flomax  5/22- Bladder scans <150cc- voiding OK.  15. Chest pain: EKG obtained and shows NSR. Troponin pending. Likely costochondritis; lidocaine patch applied.  Because of aphasia difficult to evaluate pain complaints however most consistent pain that he has been experiencing has been right shoulder pain   LOS: 9 days A FACE TO  FACE EVALUATION WAS PERFORMED  Megan [name] 04/25/2020, 4:47 PM

## 2020-04-25 NOTE — Progress Notes (Signed)
Physical Therapy Weekly Progress Note  Patient Details  Name: Brad Delacruz MRN: [id number] Date of Birth: Apr 25, 1961  Beginning of progress report period: Apr 17, 2020 End of progress report period: Apr 25, 2020  Today's Date: 04/25/2020     Patient has met 3 of 4 short term goals. Over the past week pt has made steady progress towards LTG and has progressed to requiring mod assist for bed mobility with heavy use of rails, max assist to max A of 2 for SB transfers. 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   Patient continues to demonstrate the following deficits muscle weakness, muscle joint tightness and muscle paralysis, decreased cardiorespiratoy endurance, abnormal tone, unbalanced muscle activation, decreased coordination and decreased motor planning, decreased visual acuity, decreased visual perceptual skills, decreased visual motor skills, field cut and hemianopsia, decreased attention to right and decreased motor planning, decreased initiation, decreased attention, decreased awareness, decreased problem solving, decreased safety awareness, decreased memory and delayed processing and decreased sitting balance, decreased standing balance, decreased postural control, hemiplegia and decreased balance strategies and therefore will continue to benefit from skilled PT intervention to increase functional independence with mobility.  Patient progressing toward long term goals..  Continue plan of care.  PT Short Term Goals Week 1:  PT Short Term Goal 1 (Week 1): Pt will sit EOB with mod assist up to 1 minute PT Short Term Goal 1 - Progress (Week 1): Met PT Short Term Goal 2 (Week 1): Pt will tolerate sitting OOB in WC up to 2 hours between therapies PT Short Term Goal 2 - Progress (Week 1): Met PT Short Term Goal 3 (Week 1): Pt will transfer to [facility] with max assist of 2 consistently PT Short Term  Goal 3 - Progress (Week 1): Met PT Short Term Goal 4 (Week 1): Pt will attend to right visual field 25% of the time PT Short Term Goal 4 - Progress (Week 1): Progressing toward goal Week 2:  PT Short Term Goal 1 (Week 2): Pt will perform transfer to and from [facility] with max assist of 1 consistently PT Short Term Goal 2 (Week 2): Pt will bed able to scan to R side 25-50% of time during functional activities PT Short Term Goal 3 (Week 2): Pt will maintain sitting balance EOB with min A consistently PT Short Term Goal 4 (Week 2): Pt will initiate WC mobility training    Therapy Documentation Precautions:  Precautions Precautions: Fall Restrictions Weight Bearing Restrictions: No :     Therapy/Group: Individual Therapy  Lorie Phenix 04/25/2020, 1:47 PM

## 2020-04-26 ENCOUNTER — Inpatient Hospital Stay (HOSPITAL_COMMUNITY): Payer: BC Managed Care – PPO | Admitting: Occupational Therapy

## 2020-04-26 LAB — GLUCOSE, CAPILLARY
Glucose-Capillary: 107 mg/dL — ABNORMAL HIGH (ref 70–99)
Glucose-Capillary: 188 mg/dL — ABNORMAL HIGH (ref 70–99)
Glucose-Capillary: 183 mg/dL — ABNORMAL HIGH (ref 70–99)
Glucose-Capillary: 206 mg/dL — ABNORMAL HIGH (ref 70–99)

## 2020-04-26 NOTE — Progress Notes (Signed)
[place] PHYSICAL MEDICINE & REHABILITATION PROGRESS NOTE   Subjective/Complaints:   Pt attempting to vocalize- not able to for me- however per daughter said "no" yesterday and repeated some words with family saying happy birthday yesterday.  Still having R shoulder pain per family/daughter/wife.   Also notes pt cannot see well out of L eye still.   ROS: limited by language/aphasia  Objective:   No results found. No results for input(s): WBC, HGB, HCT, PLT in the last 72 hours. No results for input(s): NA, K, CL, CO2, GLUCOSE, BUN, CREATININE, CALCIUM in the last 72 hours.  Intake/Output Summary (Last 24 hours) at 04/26/2020 1516 Last data filed at 04/26/2020 1310 Gross per 24 hour  Intake 480 ml  Output --  Net 480 ml     Physical Exam: Vital Signs Blood pressure 115/70, pulse 77, temperature 98.3 F (36.8 C), resp. rate 18, height 5\' 5"  (1.651 m), weight 75.6 kg, SpO2 97 %.  General: No acute distress- sitting up in bed; wife/daughter at bedside, NAD Mood and affect are appropriate Heart: RRR Lungs: CTA B/L- no W/R/R- good air movement Abdomen: Soft, NT, ND, (+)BS   Extremities: No clubbing, cyanosis, or edema Skin: No evidence of breakdown, no evidence of rash   Neurologic: L vision- doesn't blink well when challenge vision, motor strength is 5/5 in L nd 0/5 RIght deltoid, bicep, tricep, grip, hip flexor, knee extensors, ankle dorsiflexor and plantar flexor Sensory exam cannot assess  Musculoskeletal: right shoulder tender with PROM in ER/iR and with ABD. still has sublux right shoulder- but didn't wince with palpation.   Assessment/Plan: 1. Functional deficits secondary to Left MCA with R HP and Aphasia  which require 3+ hours per day of interdisciplinary therapy in a comprehensive inpatient rehab setting.  Physiatrist is providing close team supervision and 24 hour management of active medical problems listed below.  Physiatrist and rehab team continue to  assess barriers to discharge/monitor patient progress toward functional and medical goals  Care Tool:  Bathing    Body parts bathed by patient: Chest, Abdomen, Front perineal area, Right upper leg, Left upper leg, Face   Body parts bathed by helper: Right arm, Left arm, Buttocks, Right lower leg, Left lower leg     Bathing assist Assist Level: Maximal Assistance - Patient 24 - 49%     Upper Body Dressing/Undressing Upper body dressing   What is the patient wearing?: Pull over shirt    Upper body assist Assist Level: Maximal Assistance - Patient 25 - 49%    Lower Body Dressing/Undressing Lower body dressing      What is the patient wearing?: Incontinence brief, Pants     Lower body assist Assist for lower body dressing: Total Assistance - Patient < 25%     Toileting Toileting    Toileting assist Assist for toileting: Dependent - Patient 0%(using Stedy lift)     Transfers Chair/bed transfer  Transfers assist     Chair/bed transfer assist level: Maximal Assistance - Patient 25 - 49%     Locomotion Ambulation   Ambulation assist   Ambulation activity did not occur: Safety/medical concerns          Walk 10 feet activity   Assist  Walk 10 feet activity did not occur: Safety/medical concerns        Walk 50 feet activity   Assist Walk 50 feet with 2 turns activity did not occur: Safety/medical concerns         Walk 150 feet  activity   Assist Walk 150 feet activity did not occur: Safety/medical concerns         Walk 10 feet on uneven surface  activity   Assist Walk 10 feet on uneven surfaces activity did not occur: Safety/medical concerns         Wheelchair     Assist        Wheelchair assist level: Dependent - Patient 0% Max wheelchair distance: 150 in TIS WC    Wheelchair 50 feet with 2 turns activity    Assist        Assist Level: Dependent - Patient 0%   Wheelchair 150 feet activity     Assist       Assist Level: Dependent - Patient 0%   Blood pressure 115/70, pulse 77, temperature 98.3 F (36.8 C), resp. rate 18, height 5\' 5"  (1.651 m), weight 75.6 kg, SpO2 97 %.  Medical Problem List and Plan: 1.  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  TEE and loop recorder placement to be discussed as outpatient.             -patient may shower             -ELOS/Goals: 20-22 days MinA PT, OT, SLP  Continue CIR 2.  Antithrombotics: -DVT/anticoagulation: Subcutaneous heparin.  Venous Doppler studies negative             -antiplatelet therapy: Brilinta 90 mg twice daily, aspirin 81 mg daily 3. Pain Management: Tylenol as needed. Well controlled.   Right shoulder pain, sling for comfort Ask OT  to perform FES to R shoulder  5/22- denies pain- con't regimen  5/23- per daughter still having R shoulder pain- didn't wince to palpation today 4. Mood: Amantadine 100 mg twice daily             -antipsychotic agents: N/A 5. Neuropsych: This patient is not capable of making decisions on his own behalf. 6. Skin/Wound Care: Routine skin checks 7. Fluids/Electrolytes/Nutrition: Routine in and outs with follow-up chemistries 8.  Dysphagia.  Dysphagia #1 honey thick liquids.   Dietary follow-up as well as speech therapy 9.  Diabetes mellitus.  Hemoglobin A1c 9.2.  NovoLog 5 units 2 times daily, Lantus insulin 35 units twice daily. Uncontrolled. CBG (last 3)  Recent Labs    04/25/20 2131 04/26/20 0617 04/26/20 1147  GLUCAP 184* 107* 188*  some lability , monitor on increase lantus  5/15: CBGs elevated; increase Lantus to 36U  5/17 sugars elevate throughout the day   -add mealtime novolog, 2u tid to start A.m. CBG on low side will reduce Lantus  5/23- BGs adequate- will con't meds 10.  Hypertension.  Cozaar 50 mg daily.   Vitals:   04/26/20 0618 04/26/20 1441  BP: 121/70 115/70  Pulse:  69 77  Resp: 16 18  Temp: 98.3 F (36.8 C) 98.3 F (36.8 C)  SpO2:  97%   5/23- BP well controlled- con't meds  11.  Hyperlipidemia.  Lipitor 12.  Obesity.  BMI 29.50.  Dietary follow-up  13.  Post stroke shoulder pain - discussed with OT, add Kpad and aspercreme, tylenol for pain , no imaging needed at this time no trauma   . Keep right arm supported in a sling  5/23- not using sling in bed- sounds like for when he's up 14. Urine retention--volumes 300-600cc  5/17-OOB to void  -I/O  cath prn  -ua neg, ucx pending  -urecholine trial 10mg  tid And Flomax  5/22- Bladder scans <150cc- voiding OK.  15. Chest pain: EKG obtained and shows NSR. Troponin pending. Likely costochondritis; lidocaine patch applied.  Because of aphasia difficult to evaluate pain complaints however most consistent pain that he has been experiencing has been right shoulder pain   LOS: 10 days A FACE TO FACE EVALUATION WAS PERFORMED  Lithzy Bernard 04/26/2020, 3:16 PM

## 2020-04-26 NOTE — Progress Notes (Signed)
Occupational Therapy Weekly Progress Note  Patient Details  Name: Brad Delacruz MRN: [id number] Date of Birth: 1961-02-19  Beginning of progress report period: 04/17/2020 End of progress report period: 04/26/2020  Today's Date: 04/26/2020 OT Individual Time: 0240-9735 OT Individual Time Calculation (min): 58 min    Patient has met 2 of 3 short term goals.    Pt has made excellent progress at time of report. Pt can now complete transfers with 1 assist using Stedy and maintain unsupported sitting balance for ~35 minutes with supervision-CGA while engaging in self care activity. Pts ability to scan towards his affected side has also improved during functional tasks as well as overall initiation, ability to follow 1 step instruction, postural control, and participation. Family continues to be frequently present during OT sessions and are involved and helpful. Continue POC.    Patient continues to demonstrate the following deficits: muscle weakness and muscle paralysis, decreased cardiorespiratoy endurance, abnormal tone, unbalanced muscle activation and decreased coordination, decreased attention to right, decreased attention, decreased awareness, decreased problem solving and decreased safety awareness and decreased sitting balance, decreased standing balance, decreased postural control and hemiplegia and therefore will continue to benefit from skilled OT intervention to enhance overall performance with BADL.  Patient progressing toward long term goals..  Continue plan of care.  OT Short Term Goals Week 1:  OT Short Term Goal 1 (Week 1): Pt will maintain static sitting balance for 2 minutes with max A. OT Short Term Goal 1 - Progress (Week 1): Met OT Short Term Goal 2 (Week 1): Pt will locate self care items from R side of sink for grooming tasks with min cuing. OT Short Term Goal 2 - Progress (Week 1): Met OT Short Term Goal 3 (Week 1): Pt will perform UB dressing with mod A overall. OT  Short Term Goal 3 - Progress (Week 1): Progressing toward goal Week 2:  OT Short Term Goal 1 (Week 2): Pt will complete UB dressing with Mod A OT Short Term Goal 2 (Week 2): Pt will complete toilet transfer with 1 assist without Stedy OT Short Term Goal 3 (Week 2): Pt will complete 1/3 components of donning pants with CGA  Skilled Therapeutic Interventions/Progress Updates:    Pt greeted in bed, smiling at sight of therapist. When asked if he had eaten, pt pointed to his meal tray set up on the table on the Rt side of the room. Supine<sit completed with Max A with pt initiating assistance. He then sat at EOB for ~35 minutes with CGA-close supervision assist. Pt scanned to his Rt visual field to select 3 items from his tray without cues. He ate approx 95% of his meal, did well with tactile and demonstrational cues for ensuring swallowing between bites. No residual left in oral cavity when OT checked at end of meal. Also no s/s aspiration while he consumed thickened beverages via spoonful. Pt shook his head when OT asked if he needed to use the bathroom, but willing to try. Stedy transfer<toilet completed with Min A sit<stand. He had continent B+B void. Able to complete perihygiene in the front with cuing while semi perched on Stedy paddles. Max A for hygiene in the back. Once back in the TIS, pt assisted with pulling up his pants on the Lt side from lower leg to thigh level. Min A sit<stand in [place] to pull them up. Pt was then reclined in TIS for comfort and safety with safety belt fastened. Left pt with family to change his shirt.  Dtr and spouse present throughout session today and involved in pts care. Interpretor also present.     Therapy Documentation Precautions:  Precautions Precautions: Fall Restrictions Weight Bearing Restrictions: No Vital Signs: Therapy Vitals Temp: 98.3 F (36.8 C) Temp Source: Oral Pulse Rate: 69 Resp: 16 BP: 121/70 Patient Position (if appropriate): Lying Oxygen  Therapy SpO2: 98 % O2 Device: Room Air Pain: No s/s pain during tx   ADL:       Therapy/Group: Individual Therapy  Michaela A Hoffman 04/26/2020, 7:04 AM

## 2020-04-27 ENCOUNTER — Inpatient Hospital Stay (HOSPITAL_COMMUNITY): Payer: BC Managed Care – PPO

## 2020-04-27 ENCOUNTER — Inpatient Hospital Stay (HOSPITAL_COMMUNITY): Payer: BC Managed Care – PPO | Admitting: Occupational Therapy

## 2020-04-27 ENCOUNTER — Encounter (HOSPITAL_COMMUNITY): Payer: BC Managed Care – PPO

## 2020-04-27 DIAGNOSIS — I63512 Cerebral infarction due to unspecified occlusion or stenosis of left middle cerebral artery: Secondary | ICD-10-CM | POA: Diagnosis not present

## 2020-04-27 DIAGNOSIS — M25511 Pain in right shoulder: Secondary | ICD-10-CM | POA: Diagnosis not present

## 2020-04-27 LAB — GLUCOSE, CAPILLARY
Glucose-Capillary: 91 mg/dL (ref 70–99)
Glucose-Capillary: 155 mg/dL — ABNORMAL HIGH (ref 70–99)
Glucose-Capillary: 164 mg/dL — ABNORMAL HIGH (ref 70–99)
Glucose-Capillary: 135 mg/dL — ABNORMAL HIGH (ref 70–99)

## 2020-04-27 NOTE — Progress Notes (Signed)
Modified Barium Swallow Progress Note  Patient Details  Name: Brad Delacruz MRN: [id number] Date of Birth: [birth year]  Today's Date: 04/27/2020  Modified Barium Swallow completed.  Full report located under Chart Review in the Imaging Section.  Brief recommendations include the following:  Clinical Impression    Pt demonstrate mild oral dysphagia. Oral phase was characterized by piecemeal deglutition and premature spillage when consuming thin liquids via cup and straw. Swallow was initiated at pyriform sinuses with intermittent trace penetration that was cleared at the completion of the swallow. X1 occurrence of possible trace penetration reaching the vocal folds, however appeared to clear upon completion of the swallow. No aspiration was noted on assessed trials of thin liquids, puree textures, regular textures and mixed consistencies. Pt demonstrated anterior right spillage, mild prolonged mastication, piecemeal deglutition, mild oral residue (cleared with subsequent swallows or liquid washes) and swallow initiated at vallecula with puree and regular textures. Pt demonstrated appropriate base of tongue retraction, pharyngeal contraction, hyolaryngeal elevation/excursion, laryngeal vestibule closure and no pharyngeal residue with assessed trials. SLP recommends full supervision of thin liquids, dys 3 textures and medication whole with thin liquids. Follow up SLP sessions are appropriate to aid in increasing sensation of right oral cavity (to reduce anterior spillage and oral residue), education of compensatory strategies (lingual sweeps and monitoring anterior loss) as well as trials for solid upgrade.   Swallow Evaluation Recommendations       SLP Diet Recommendations: Dysphagia 3 (Mech soft) solids;Thin liquid   Liquid Administration via: Cup;Straw   Medication Administration: Whole meds with liquid   Supervision: Staff to assist with self feeding;Full supervision/cueing for  compensatory strategies   Compensations: Slow rate;Small sips/bites;Minimize environmental distractions;Monitor for anterior loss   Postural Changes: Seated upright at 90 degrees   Oral Care Recommendations: Oral care BID        [name]  [facility] 04/27/2020,1:08 PM

## 2020-04-27 NOTE — Progress Notes (Signed)
Physical Therapy Session Note  Patient Details  Name: Takuma Cifelli MRN: [id number] Date of Birth: 1960/12/19  Today's Date: 04/27/2020 PT Individual Time: 0800-0855 PT Individual Time Calculation (min): 55 min   Short Term Goals: Week 2:  PT Short Term Goal 1 (Week 2): Pt will perform transfer to and from [facility] with max assist of 1 consistently PT Short Term Goal 2 (Week 2): Pt will bed able to scan to R side 25-50% of time during functional activities PT Short Term Goal 3 (Week 2): Pt will maintain sitting balance EOB with min A consistently PT Short Term Goal 4 (Week 2): Pt will initiate WC mobility training  Skilled Therapeutic Interventions/Progress Updates: pt presented in bed with family present agreeable to therapy. Per dgt pt indicating increased pain this am with shoulder. PTA donned sling for pain management as lidocaine patch already placed. Pt also noted to not have pants on. Performed supine to sit maxA x 1 and PTA threaded pants total A for time management Performed STS maxA x 1 to [facility] and transferred to [place]. During transfer pt required max cues to maintain neutral due to forward lean and impulsiveness noted when trying to correct. Pt transferred to rehab gym and performed SB transfer to mat with maxA x 1. Pt participated in sitting balance with mirror feedback reaching for horseshoe and returning to midline. Pt demonstrated fair control when reaching to L however required modA for reaching to R with pt unable to recover returning to midline with moderate challenges. Pt was able to control sitting balance with min challenges and was overall supervision with seated unsupported balance. Nsg arrived and indicated that transport present for pt's MBS. Pt performed STS in [place] maxA and transferred to TIS with significantly improved standing balance. Pt transferred back to room and performed Stedy transfer to EOB. Pt sat EOB x 5 min with CGA as MD arrived for daily assessment. Pt  transferred to supine maxA x 1 and repositioned to comfort. Pt left in bed handed off to transport with current needs met.      Therapy Documentation Precautions:  Precautions Precautions: Fall Restrictions Weight Bearing Restrictions: No General:   Vital Signs: Therapy Vitals Temp: 98.5 F (36.9 C) Pulse Rate: 80 Resp: 18 BP: 130/76 Patient Position (if appropriate): Sitting Oxygen Therapy SpO2: 97 % O2 Device: Room Air Pain: Pain Assessment Pain Scale: 0-10 Pain Score: Asleep Faces Pain Scale: No hurt Pain Type: Acute pain Pain Location: Head Pain Orientation: Upper;Mid Pain Descriptors / Indicators: Aching Pain Onset: Gradual Patients Stated Pain Goal: 1 Pain Intervention(s): Repositioned;Medication (See eMAR) Multiple Pain Sites: No    Therapy/Group: Individual Therapy  Arvis Zwahlen  Jayni Prescher, PTA  04/27/2020, 3:00 PM

## 2020-04-27 NOTE — Progress Notes (Addendum)
Occupational Therapy Session Note  Patient Details  Name: Brad Delacruz MRN: [id number] Date of Birth: 11/03/1961  Today's Date: 04/27/2020 OT Individual Time: 1300-1411 OT Individual Time Calculation (min): 71 min   Short Term Goals: Week 2:  OT Short Term Goal 1 (Week 2): Pt will complete UB dressing with Mod A OT Short Term Goal 2 (Week 2): Pt will complete toilet transfer with 1 assist without Stedy OT Short Term Goal 3 (Week 2): Pt will complete 1/3 components of donning pants with CGA  Skilled Therapeutic Interventions/Progress Updates:    Pt greeted in bed with spouse present, helping him with eating lunch. No interpretor present during session. OT notified RN to have interpretor relay to spouse that only staff can assist pt with feeding for safety. Pt was agreeable to shower today, initiated transition towards EOB and completed supine<sit with Mod A. Mod A for squat pivot<w/c going towards the Lt side. Mod A for squat pivot<TTB going towards Lt side once again, 2nd helper stabilizing the Lt knee with CGA during transfer. Sit<stand with Mod A using grab bar while 2nd helper assisted with lowering clothing in prep for shower. Pt with active Lt knee buckling during all stands today and needed this joint blocked. He was able to unbutton shirt himself but needed A for unthreading arms. Pt required CGA for dynamic sitting balance while engaging in UB bathing tasks, needed demonstrational and HOH cuing for initiation, sequencing, and attending to affected side. Due to grimacing when Lt arm was elevated, OT washed his Rt arm without HOH. Sit<stand with Mod A while 2nd helper completed perihygiene. Max A of 1 for squat-stand pivot back to w/c after, CGA of 2nd helper to block Lt knee at this time. He then engaged in dressing tasks sit<stand at the sink. Mod A for donning button up shirt with pt able to fasten every button by himself! Mod A of 1 for sit<stand with mild Rt pushing while 2nd helper  assisted with elevating pants. Total A for Teds and footwear. Pt with increased initiation to assist with pulling up and adjusting clothing. HOH demonstrational cuing for motor planning during oral care and hair combing while sitting at the sink after. Pt remained reclined in TIS with all needs within reach, Rt arm supported in sling and elevated, safety belt fastened, and call bell in lap. Tx focus placed on NMR, functional transfers, ADL retraining, praxis, and sitting balance.      Therapy Documentation Precautions:  Vital Signs: Therapy Vitals Temp: 98.5 F (36.9 C) Pulse Rate: 80 Resp: 18 BP: 130/76 Patient Position (if appropriate): Sitting Oxygen Therapy SpO2: 97 % O2 Device: Room Air Pain: Utilized positioning strategies for pain mgt during session, notified RN at end of tx that pt was still showing s/s Rt arm pain Pain Assessment Pain Scale: 0-10 Pain Score: Asleep Faces Pain Scale: No hurt Pain Type: Acute pain Pain Location: Head Pain Orientation: Upper;Mid Pain Descriptors / Indicators: Aching Pain Onset: Gradual Patients Stated Pain Goal: 1 Pain Intervention(s): Repositioned;Medication (See eMAR) Multiple Pain Sites: No ADL:  :     Therapy/Group: Individual Therapy  Azeem Poorman A Tayia Stonesifer 04/27/2020, 3:46 PM

## 2020-04-27 NOTE — Progress Notes (Signed)
[place] [facility] PHYSICAL MEDICINE & REHABILITATION PROGRESS NOTE   Subjective/Complaints:   Daughter  States that he is saying no at times.  PT notes that Left shoulder pain limited therapy a little today  Pt appears comfortable in R arm sling during transfer today   ROS: limited by language/aphasia  Objective:   No results found. No results for input(s): WBC, HGB, HCT, PLT in the last 72 hours. No results for input(s): NA, K, CL, CO2, GLUCOSE, BUN, CREATININE, CALCIUM in the last 72 hours.  Intake/Output Summary (Last 24 hours) at 04/27/2020 0832 Last data filed at 04/27/2020 0720 Gross per 24 hour  Intake 480 ml  Output 650 ml  Net -170 ml     Physical Exam: Vital Signs Blood pressure 108/64, pulse 67, temperature (!) 97.5 F (36.4 C), resp. rate 16, height 5\' 5"  (1.651 m), weight 75.6 kg, SpO2 98 %.  General: No acute distress- sitting up in bed; wife/daughter at bedside, NAD Mood and affect are appropriate Heart: RRR Lungs: CTA B/L- no W/R/R- good air movement Abdomen: Soft, NT, ND, (+)BS   Extremities: No clubbing, cyanosis, or edema Skin: No evidence of breakdown, no evidence of rash   Neurologic: L vision- doesn't blink well when challenge vision, motor strength is 5/5 in L nd 0/5 RIght deltoid, bicep, tricep, grip, hip flexor, knee extensors, ankle dorsiflexor and plantar flexor Sensory exam cannot assess  Musculoskeletal: right shoulder tender with PROM in ER/iR and with ABD. still has sublux right shoulder- but didn't wince with palpation.   Assessment/Plan: 1. Functional deficits secondary to Left MCA with R HP and Aphasia  which require 3+ hours per day of interdisciplinary therapy in a comprehensive inpatient rehab setting.  Physiatrist is providing close team supervision and 24 hour management of active medical problems listed below.  Physiatrist and rehab team continue to assess barriers to discharge/monitor patient progress toward functional and medical  goals  Care Tool:  Bathing    Body parts bathed by patient: Chest, Abdomen, Front perineal area, Right upper leg, Left upper leg, Face   Body parts bathed by helper: Right arm, Left arm, Buttocks, Right lower leg, Left lower leg     Bathing assist Assist Level: Maximal Assistance - Patient 24 - 49%     Upper Body Dressing/Undressing Upper body dressing   What is the patient wearing?: Pull over shirt    Upper body assist Assist Level: Maximal Assistance - Patient 25 - 49%    Lower Body Dressing/Undressing Lower body dressing      What is the patient wearing?: Incontinence brief, Pants     Lower body assist Assist for lower body dressing: Total Assistance - Patient < 25%     Toileting Toileting    Toileting assist Assist for toileting: Dependent - Patient 0%(using Stedy lift)     Transfers Chair/bed transfer  Transfers assist     Chair/bed transfer assist level: Maximal Assistance - Patient 25 - 49%     Locomotion Ambulation   Ambulation assist   Ambulation activity did not occur: Safety/medical concerns          Walk 10 feet activity   Assist  Walk 10 feet activity did not occur: Safety/medical concerns        Walk 50 feet activity   Assist Walk 50 feet with 2 turns activity did not occur: Safety/medical concerns         Walk 150 feet activity   Assist Walk 150 feet activity did not occur:  Safety/medical concerns         Walk 10 feet on uneven surface  activity   Assist Walk 10 feet on uneven surfaces activity did not occur: Safety/medical concerns         Wheelchair     Assist        Wheelchair assist level: Dependent - Patient 0% Max wheelchair distance: 150 in TIS WC    Wheelchair 50 feet with 2 turns activity    Assist        Assist Level: Dependent - Patient 0%   Wheelchair 150 feet activity     Assist      Assist Level: Dependent - Patient 0%   Blood pressure 108/64, pulse 67,  temperature (!) 97.5 F (36.4 C), resp. rate 16, height 5\' 5"  (1.651 m), weight 75.6 kg, SpO2 98 %.  Medical Problem List and Plan: 1.  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  TEE and loop recorder placement to be discussed as outpatient.             -patient may shower             -ELOS/Goals: 20-22 days MinA PT, OT, SLP  Continue CIR 2.  Antithrombotics: -DVT/anticoagulation: Subcutaneous heparin.  Venous Doppler studies negative             -antiplatelet therapy: Brilinta 90 mg twice daily, aspirin 81 mg daily 3. Pain Management: Tylenol as needed. Well controlled.   Right shoulder pain, sling for comfort Ask OT  to perform FES to R shoulder  Will check Xray to see if underlying arthritis , if so consider injection  Tramadol prior to therapy  4. Mood: Amantadine 100 mg twice daily             -antipsychotic agents: N/A 5. Neuropsych: This patient is not capable of making decisions on his own behalf. 6. Skin/Wound Care: Routine skin checks 7. Fluids/Electrolytes/Nutrition: Routine in and outs with follow-up chemistries 8.  Dysphagia.  Dysphagia #1 honey thick liquids.   Dietary follow-up as well as speech therapy 9.  Diabetes mellitus.  Hemoglobin A1c 9.2.  NovoLog 5 units 2 times daily, Lantus insulin 35 units twice daily. Uncontrolled. CBG (last 3)  Recent Labs    04/26/20 1638 04/26/20 2103 04/27/20 0615  GLUCAP 183* 206* 91  some lability , monitor on increase lantus  5/15: CBGs elevated; increase Lantus to 36U  5/17 sugars elevate throughout the day   -add mealtime novolog, 2u tid to start A.m. CBG on low side will reduce Lantus  5/23- BGs adequate- will con't meds 10.  Hypertension.  Cozaar 50 mg daily.   Vitals:   04/26/20 2023 04/27/20 0432  BP: (!) 109/58 108/64  Pulse: 82 67  Resp: 16 16  Temp: 97.8 F (36.6 C) (!) 97.5 F (36.4 C)  SpO2: 97% 98%    5/23- BP well controlled- con't meds  11.  Hyperlipidemia.  Lipitor 12.  Obesity.  BMI 29.50.  Dietary follow-up  13.  Post stroke shoulder pain - discussed with OT, add Kpad and aspercreme, tylenol for pain , no imaging needed at this time no trauma   CHeck xray to see if underlying OA - consider corticosteroid injection  14. Urine retention--volumes 300-600cc  5/17-OOB to void  -I/O cath prn  -ua neg, ucx pending  -urecholine trial 10mg  tid And Flomax  5/22- Bladder  scans <150cc- voiding OK.     LOS: 11 days A FACE TO FACE EVALUATION WAS PERFORMED  Erick Colace 04/27/2020, 8:32 AM

## 2020-04-28 ENCOUNTER — Inpatient Hospital Stay (HOSPITAL_COMMUNITY): Payer: BC Managed Care – PPO | Admitting: Occupational Therapy

## 2020-04-28 ENCOUNTER — Inpatient Hospital Stay (HOSPITAL_COMMUNITY): Payer: BC Managed Care – PPO | Admitting: *Deleted

## 2020-04-28 ENCOUNTER — Inpatient Hospital Stay (HOSPITAL_COMMUNITY): Payer: BC Managed Care – PPO

## 2020-04-28 LAB — GLUCOSE, CAPILLARY
Glucose-Capillary: 116 mg/dL — ABNORMAL HIGH (ref 70–99)
Glucose-Capillary: 121 mg/dL — ABNORMAL HIGH (ref 70–99)
Glucose-Capillary: 111 mg/dL — ABNORMAL HIGH (ref 70–99)
Glucose-Capillary: 142 mg/dL — ABNORMAL HIGH (ref 70–99)

## 2020-04-28 NOTE — Progress Notes (Addendum)
[place] PHYSICAL MEDICINE & REHABILITATION PROGRESS NOTE   Subjective/Complaints:  No issues overnite , discussed with RN about BVI and cath vol  Per daughter , pt still having some shoulder pain ,  Discussed poor comprehension, cannot ID body parts, pt appears to understand and nod at times  ROS: limited by language/aphasia  Objective:   DG Shoulder Right  Result Date: 04/27/2020 CLINICAL DATA:  Right shoulder pain and weakness. CVA. EXAM: RIGHT SHOULDER - 2+ VIEW COMPARISON:  None. FINDINGS: AP and Y-views are submitted. The mineralization and alignment are normal. There is no evidence of acute fracture or dislocation. The subacromial space is preserved. There are mild acromioclavicular degenerative changes. IMPRESSION: No acute osseous findings. Mild acromioclavicular degenerative changes. Electronically Signed   By: Carey Bullocks M.D.   On: 04/27/2020 09:50   No results for input(s): WBC, HGB, HCT, PLT in the last 72 hours. No results for input(s): NA, K, CL, CO2, GLUCOSE, BUN, CREATININE, CALCIUM in the last 72 hours.  Intake/Output Summary (Last 24 hours) at 04/28/2020 0747 Last data filed at 04/28/2020 0600 Gross per 24 hour  Intake 240 ml  Output 550 ml  Net -310 ml     Physical Exam: Vital Signs Blood pressure 120/66, pulse 70, temperature 97.9 F (36.6 C), resp. rate 14, height 5\' 5"  (1.651 m), weight 75.6 kg, SpO2 99 %.  General: No acute distress Mood and affect are appropriate Heart: Regular rate and rhythm no rubs murmurs or extra sounds Lungs: Clear to auscultation, breathing unlabored, no rales or wheezes Abdomen: Positive bowel sounds, soft nontender to palpation, nondistended Extremities: No clubbing, cyanosis, or edema Skin: No evidence of breakdown, no evidence of rash  Neurologic:, motor strength is 5/5 in L nd 0/5 RIght deltoid, bicep, tricep, grip, hip flexor, knee extensors, ankle dorsiflexor and plantar flexor Sensory exam cannot assess   Musculoskeletal: right shoulder tender with PROM in ER/iR and with ABD. still has sublux right shoulder- but didn't wince with palpation.   Assessment/Plan: 1. Functional deficits secondary to Left MCA with R HP and Aphasia  which require 3+ hours per day of interdisciplinary therapy in a comprehensive inpatient rehab setting.  Physiatrist is providing close team supervision and 24 hour management of active medical problems listed below.  Physiatrist and rehab team continue to assess barriers to discharge/monitor patient progress toward functional and medical goals  Care Tool:  Bathing    Body parts bathed by patient: Chest, Abdomen, Front perineal area, Right upper leg, Left upper leg, Face   Body parts bathed by helper: Right arm, Left arm, Buttocks, Right lower leg, Left lower leg     Bathing assist Assist Level: Maximal Assistance - Patient 24 - 49%     Upper Body Dressing/Undressing Upper body dressing   What is the patient wearing?: Pull over shirt    Upper body assist Assist Level: Maximal Assistance - Patient 25 - 49%    Lower Body Dressing/Undressing Lower body dressing      What is the patient wearing?: Incontinence brief, Pants     Lower body assist Assist for lower body dressing: Total Assistance - Patient < 25%     Toileting Toileting    Toileting assist Assist for toileting: Dependent - Patient 0%(using Stedy lift)     Transfers Chair/bed transfer  Transfers assist     Chair/bed transfer assist level: Maximal Assistance - Patient 25 - 49%     Locomotion Ambulation   Ambulation assist   Ambulation activity did  not occur: Safety/medical concerns          Walk 10 feet activity   Assist  Walk 10 feet activity did not occur: Safety/medical concerns        Walk 50 feet activity   Assist Walk 50 feet with 2 turns activity did not occur: Safety/medical concerns         Walk 150 feet activity   Assist Walk 150 feet activity  did not occur: Safety/medical concerns         Walk 10 feet on uneven surface  activity   Assist Walk 10 feet on uneven surfaces activity did not occur: Safety/medical concerns         Wheelchair     Assist        Wheelchair assist level: Dependent - Patient 0% Max wheelchair distance: 150 in TIS WC    Wheelchair 50 feet with 2 turns activity    Assist        Assist Level: Dependent - Patient 0%   Wheelchair 150 feet activity     Assist      Assist Level: Dependent - Patient 0%   Blood pressure 120/66, pulse 70, temperature 97.9 F (36.6 C), resp. rate 14, height 5\' 5"  (1.651 m), weight 75.6 kg, SpO2 99 %.  Medical Problem List and Plan: 1.  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  TEE and loop recorder placement to be discussed as outpatient.             -patient may shower             -ELOS/Goals: 20-22 days MinA PT, OT, SLP  Continue CIR, team conf in am  2.  Antithrombotics: -DVT/anticoagulation: Subcutaneous heparin.  Venous Doppler studies negative             -antiplatelet therapy: Brilinta 90 mg twice daily, aspirin 81 mg daily 3. Pain Management: Tylenol as needed. Well controlled.   Right shoulder pain, sling for comfort Ask OT  to perform FES to R shoulder  Will check Xray to see if underlying arthritis min AC jt DJD, 4. Mood: Amantadine 100 mg twice daily             -antipsychotic agents: N/A 5. Neuropsych: This patient is not capable of making decisions on his own behalf. 6. Skin/Wound Care: Routine skin checks 7. Fluids/Electrolytes/Nutrition: Routine in and outs with follow-up chemistries 8.  Dysphagia.  Dysphagia #1 honey thick liquids.   Dietary follow-up as well as speech therapy 9.  Diabetes mellitus.  Hemoglobin A1c 9.2.  NovoLog 5 units 2 times daily, Lantus insulin 35 units twice daily. Uncontrolled. CBG (last 3)   Recent Labs    04/27/20 1635 04/27/20 2115 04/28/20 0634  GLUCAP 164* 135* 116*  some lability , monitor on increase lantus  5/15: CBGs elevated; increase Lantus to 36U  5/17 sugars elevate throughout the day   -add mealtime novolog, 2u tid to start A.m. CBG on low side will reduce Lantus  5/25- BGs adequate- will con't meds 10.  Hypertension.  Cozaar 50 mg daily.   Vitals:   04/27/20 2006 04/28/20 0533  BP: 112/63 120/66  Pulse: 91 70  Resp: 16 14  Temp: 98.2 F (36.8 C) 97.9 F (36.6 C)  SpO2: 99% 99%   5/25- BP well controlled- con't meds  11.  Hyperlipidemia.  Lipitor 12.  Obesity.  BMI 29.50.  Dietary follow-up  13.  Post stroke shoulder pain - discussed with OT, add Kpad and aspercreme, tylenol for pain , no imaging needed at this time no trauma   CHeck xray AC jt arthritis 14. Urine retention--volumes 300-600cc on BVI but only cathed for this am per RN    -urecholine trial 10mg  tid  Flomax 0.4mg    5/22- Bladder scans <150cc- voiding OK.     LOS: 12 days A FACE TO FACE EVALUATION WAS PERFORMED  6/22 04/28/2020, 7:47 AM

## 2020-04-28 NOTE — Progress Notes (Signed)
Speech Language Pathology Daily Session Note  Patient Details  Name: Brad Delacruz MRN: [id number] Date of Birth: 1961-02-21  Today's Date: 04/28/2020 SLP Individual Time: 1962-2297 SLP Individual Time Calculation (min): 55 min  Short Term Goals: Week 2: SLP Short Term Goal 1 (Week 2): Patient will demonstrate sustained attention to functional tasks for 15 minutes with Mod verbal cues for redirection. SLP Short Term Goal 2 (Week 2): Patient will complete basic, familiar tasks with mod assist for functional problem solving. SLP Short Term Goal 3 (Week 2): Patient will vocalize on command in 25% of opportunites with Max A multimodal cues. SLP Short Term Goal 4 (Week 2): Patient will utilize gestures to answer basic yes/no questions in 75% of opportunities with Min A verbal and visual cues. SLP Short Term Goal 5 (Week 2): Patient will consume current diet with minimal overt s/s of aspiration and supervision verbal cues for use of swallowing compensatory strategies. SLP Short Term Goal 6 (Week 2): Patient will consume trials of thin liquids via cup sips with minimal overt s/s of aspiration and Min verbal cues for use of swallowing precautions  Skilled Therapeutic Interventions: Skilled ST services focused on education, swallow and language skills. SLP provided education to pt, pt's wife and daughter pertaining to yesterday's MBS results, daughter translated during today's session. All questions were answered. SLP left room to get supplies and when the writer returned NT was present and changing pt's bedding/brief. Pt's family informed that pt communicated with them via gestures and confirmed with yes/no gesture responses, that he he needed to use the bathroom, but was unable to wait for assistance. SLP assisted NT with bedding, brief and clothing changing while in bed, pt demonstrated ability to follow 1 step commands during bed mobility with supervision A verbal/visual cues.  SLP facilitated  accuarcy in response of yes/no questions pertaining to self/environment with 80% accuracy, pt voiced "no" and then began to cry. Pt vocied "no" x5 during session and "ti" x1 while attempting to sing happy birthday song in spanish. Pt also voiced x2 unintelligible 3-4 word phrases during the session. Pt was unable to imitate movements or vocalize when counting 1-10, but was able to imitate oral movements in 20% of the happy birthday song in spanish. SLP educated family to facilitate automatic language tasks (counting, days of the week and singing to familiar songs). SLP also facilitated trials of cold verse warm applesauce to increase oral sensation in the right oral cavity. Pt demonstrated increase sensation on the right side of oral cavity (indicating cold verse warm in response with yes/no questions and attempted voice x2) after comparing sensation on the left side of oral cavity. SLP also provided education to family to continue sensation awareness, providing examples and use of yes/no responses. All questions were answered to satisfaction. Pt was left in room with call bell within reach and bed alarm set. ST recommends to continue skilled ST services.      Pain Pain Assessment Pain Scale: 0-10 Pain Score: 0-No pain  Therapy/Group: Individual Therapy  Brad Delacruz  [facility] 04/28/2020, 7:57 AM

## 2020-04-28 NOTE — Progress Notes (Signed)
Physical Therapy Session Note  Patient Details  Name: Brad Delacruz MRN: [id number] Date of Birth: October 20, 1961  Today's Date: 04/28/2020 PT Individual Time: 1315-1430 PT Individual Time Calculation (min): 75 min   Short Term Goals: Week 2:  PT Short Term Goal 1 (Week 2): Pt will perform transfer to and from [facility] with max assist of 1 consistently PT Short Term Goal 2 (Week 2): Pt will bed able to scan to R side 25-50% of time during functional activities PT Short Term Goal 3 (Week 2): Pt will maintain sitting balance EOB with min A consistently PT Short Term Goal 4 (Week 2): Pt will initiate WC mobility training  Skilled Therapeutic Interventions/Progress Updates: Pt presented in bed with wife and interpreter present agreeable to therapy. Pt initially nodding head no when asked about pain however during session noted pt grimacing while performing standing activities and performing bed mobility. Pt performed supine to sit at EOB maxA with pt able to maintain sitting EOB with CGA. Performed squat pivot transfer maxA x 1 to TIS. Pt transported to rehab gym and performed squat pivot transfer to L to mat maxA. Pt required modA for scooting to L to center self on mat. Pt then participated in Standing in [place] while reaching and placing cards onto board. Pt required max cues for reaching for card and placing in correct place. Pt encouraged to maintain erect posture while placing card on board. Pt tolerated x 3 cards at a time seated rest breaks between. Once completed pt returned to [place] and transported to ortho gym to participate in [place] for R visual scanning. Pt participated in mode A x 5 min with pt initially requiring PTA to turn pt's head to R to scan for light however significantly improved in last 3 min. Pt's results as follows: LUQ 5.8 sec, LLQ 3.38 sec, RUQ 15.16 sec, RLQ 10.11 sec. Pt then transported to day room and participated in Cybex kinetron 70ccm/sec for 2 2 min bouts for reciprocal  activity and forced use of RLE. Pt was able to initiate depression due to engaging glutes in RLE. Pt notably fatigued after activity. Pt transported back to room and pt becoming very restless prior to attempting to transfer back to bed. Pt nodding head no when asked if wanted to go to bed, but nodded head no when asked if wanted to stay in chair. Pt did nod head yes when asked if wanted to use bathroom. PTA obtained Stedy and when pt performed STS noted urinary incontinence. Pt transferred to EOB with +2 present for safety and PTA lowered pants and removed brief prior to sitting. Pt sat at EOB and performed supine to sit maxA to allow safe pere-care. Pt noted to having active BM and placed on bedpan (only smear). Pt performed rolling L/R modA with bed rails and verbal cues for completion of peri-care and donning brief. Pt then repositioned to comfort with pt sitting upright and pillows placed under pt's elbow while in sling. Pt left with bed alarm on, call bell within reach and needs met.      Therapy Documentation Precautions:  Precautions Precautions: Fall Restrictions Weight Bearing Restrictions: No General:   Vital Signs: Therapy Vitals Temp: 97.8 F (36.6 C) Pulse Rate: 74 Resp: 18 BP: 112/68 Patient Position (if appropriate): Lying Oxygen Therapy SpO2: 98 % O2 Device: Room Air    Therapy/Group: Individual Therapy  Ednah Hammock  Dartanion Teo 04/28/2020, 3:54 PM

## 2020-04-28 NOTE — Progress Notes (Signed)
Occupational Therapy Session Note  Patient Details  Name: Brad Delacruz MRN: [id number] Date of Birth: 11-27-1961  Today's Date: 04/28/2020 OT Individual Time: 9470-9628 OT Individual Time Calculation (min): 57 min    Short Term Goals: Week 2:  OT Short Term Goal 1 (Week 2): Pt will complete UB dressing with Mod A OT Short Term Goal 2 (Week 2): Pt will complete toilet transfer with 1 assist without Stedy OT Short Term Goal 3 (Week 2): Pt will complete 1/3 components of donning pants with CGA  Skilled Therapeutic Interventions/Progress Updates:    Upon entering the room, pt supine in bed with daughter and wife present. Pt agreeable to attempt toileting this morning. Supine >sit with mod A to EOB. OT placing R UE into sling for comfort with transfer. Mod A of 2 squat pivot transfer into wheelchair towards L side. OT assisted pt into bathroom via wheelchair. Max A of 2 stand pivot transfer onto commode. Pt sitting with close supervision for balance but unable to void. Total A LB dressing from commode (pt doing less than 25%). Pt standing with max A while second helper assisted with clothing management. Max A of 2 stand pivot transfer back into wheelchair towards R side. UB self care and focus on hemiplegic dressing technique while seated in tilt in space wheelchair with at sink. Max A for UB dressing with button up shirt. Ot donning sling again for comfort. Pt brushing teeth with mod cuing to initiate task at sink. Pt perseverating on task this session. Pt then given comb and attempting to put into mouth with hand over hand assistance needed for him to initiate combing hair. Pt remained in wheelchair and tilted slightly for comfort and safety. Chair alarm belt donned and call bell within reach.   Therapy Documentation Precautions:  Precautions Precautions: Fall Restrictions Weight Bearing Restrictions: No Pain: Pain Assessment Pain Scale: 0-10 Pain Score: 0-No pain   Therapy/Group:  Individual Therapy  Alen Bleacher 04/28/2020, 10:02 AM

## 2020-04-29 ENCOUNTER — Inpatient Hospital Stay (HOSPITAL_COMMUNITY): Payer: BC Managed Care – PPO | Admitting: Physical Therapy

## 2020-04-29 ENCOUNTER — Inpatient Hospital Stay (HOSPITAL_COMMUNITY): Payer: BC Managed Care – PPO

## 2020-04-29 ENCOUNTER — Inpatient Hospital Stay (HOSPITAL_COMMUNITY): Payer: BC Managed Care – PPO | Admitting: Occupational Therapy

## 2020-04-29 LAB — GLUCOSE, CAPILLARY
Glucose-Capillary: 81 mg/dL (ref 70–99)
Glucose-Capillary: 217 mg/dL — ABNORMAL HIGH (ref 70–99)
Glucose-Capillary: 197 mg/dL — ABNORMAL HIGH (ref 70–99)
Glucose-Capillary: 264 mg/dL — ABNORMAL HIGH (ref 70–99)

## 2020-04-29 MED ORDER — LIDOCAINE HCL 1 % IJ SOLN
5.0000 mL | Freq: Once | INTRAMUSCULAR | Status: DC
  Filled 2020-04-29: qty 5

## 2020-04-29 MED ORDER — TRAMADOL HCL 50 MG PO TABS
50.0000 mg | ORAL_TABLET | Freq: Four times a day (QID) | ORAL | Status: DC | PRN
  Administered 2020-04-29 – 2020-05-11 (×5): 50 mg via ORAL
  Filled 2020-04-29 (×6): qty 1

## 2020-04-29 MED ORDER — LIDOCAINE HCL (PF) 1 % IJ SOLN
5.0000 mL | Freq: Once | INTRAMUSCULAR | Status: AC
  Administered 2020-04-29: 5 mL
  Filled 2020-04-29: qty 5

## 2020-04-29 MED ORDER — BETAMETHASONE SOD PHOS & ACET 6 (3-3) MG/ML IJ SUSP
12.0000 mg | Freq: Once | INTRAMUSCULAR | Status: AC
  Administered 2020-04-29: 12 mg via INTRA_ARTICULAR
  Filled 2020-04-29: qty 2

## 2020-04-29 NOTE — Progress Notes (Signed)
Team Conference Report to Patient/Family  Team Conference discussion was reviewed with the patient and caregiver, including goals, any changes in plan of care and target discharge date.  Patient and caregiver express understanding and are in agreement.  The patient has a target discharge date of (SNF recommended due to high level of care needs). SW will provide SNF and HC options for family to discuss.   [name] 04/29/2020, 2:28 PM   Patient ID: Brad Delacruz, male   DOB: Aug 19, 1961, 59 y.o.   MRN: [id number]

## 2020-04-29 NOTE — Progress Notes (Signed)
Physical Therapy Session Note  Patient Details  Name: Brad Delacruz MRN: [id number] Date of Birth: 1960-12-18  Today's Date: 04/29/2020 PT Individual Time: 6734-1937 PT Individual Time Calculation (min): 72 min   Short Term Goals: Week 2:  PT Short Term Goal 1 (Week 2): Pt will perform transfer to and from [facility] with max assist of 1 consistently PT Short Term Goal 2 (Week 2): Pt will bed able to scan to R side 25-50% of time during functional activities PT Short Term Goal 3 (Week 2): Pt will maintain sitting balance EOB with min A consistently PT Short Term Goal 4 (Week 2): Pt will initiate WC mobility training  Skilled Therapeutic Interventions/Progress Updates:   Pt received sitting in WC and agreeable to PT. Pt transported to orthogym. PT obtained 18x16 hemi height WC. Sb transfer to mat table with max assist on the R. Sitting balance x 2 min with CGA-supervision assist from PT, one near LOB to the R, but once corrected, no additional LOB noted. SB transfer to return to the hemi height WC with mod assist from PT. PT instructed pt in [facility] mobility with hemi technique and emphasis on the use of LEx 45f with mod assist. PT obtaines shoes and then instructed pt in additional WC mobility x 1563fwith mod assist. Improving use of hemi techinque with distance and max cues for improved use of LE to control direction. Sb transfer to mat table with mod-max assist on the R. Sit<>stand in stedy x 8 with min assist. Pt performed balance training in semi-standing with vertical reach to target x 8 and in standing with mod-max assist in stedy x 5 with LUE. Blocked practice scooting EOB x 5 R and x 5 L with max assist progressing to mod assist to block RLE. Pt returned to room and performed SB transfer to bed with Mod assist and RLE blocked, improve dWB throughout RLE and pelvic control compared to previously in session. Sit>supine completed with Mod assist at LE. Pt   left supine in bed with call bell in reach  and all needs met.        Therapy Documentation Precautions:  Precautions Precautions: Fall Restrictions Weight Bearing Restrictions: No Vital Signs: Therapy Vitals Temp: 97.8 F (36.6 C) Pulse Rate: 71 Resp: 18 BP: 115/68 Patient Position (if appropriate): Sitting Oxygen Therapy SpO2: 100 % O2 Device: Room Air Pain: Faces: none   Therapy/Group: Individual ThTalbert Nan/26/2021, 3:33 PM

## 2020-04-29 NOTE — Progress Notes (Signed)
[facility] PHYSICAL MEDICINE & REHABILITATION PROGRESS NOTE   Subjective/Complaints:  Per RN having shoulder pain today  OT indicates that patient also grimaces when he needs to go to the bathroom. Per daughter with speech therapy patient indicated that his pain was a 3/5 at rest  ROS: limited by language/aphasia  Objective:   DG Shoulder Right  Result Date: 04/27/2020 CLINICAL DATA:  Right shoulder pain and weakness. CVA. EXAM: RIGHT SHOULDER - 2+ VIEW COMPARISON:  None. FINDINGS: AP and Y-views are submitted. The mineralization and alignment are normal. There is no evidence of acute fracture or dislocation. The subacromial space is preserved. There are mild acromioclavicular degenerative changes. IMPRESSION: No acute osseous findings. Mild acromioclavicular degenerative changes. Electronically Signed   By: Richardean Sale M.D.   On: 04/27/2020 09:50   No results for input(s): WBC, HGB, HCT, PLT in the last 72 hours. No results for input(s): NA, K, CL, CO2, GLUCOSE, BUN, CREATININE, CALCIUM in the last 72 hours.  Intake/Output Summary (Last 24 hours) at 04/29/2020 0919 Last data filed at 04/29/2020 0824 Gross per 24 hour  Intake 680 ml  Output 800 ml  Net -120 ml     Physical Exam: Vital Signs Blood pressure 105/61, pulse 66, temperature 98.7 F (37.1 C), temperature source Oral, resp. rate 18, height 5' 5"  (1.651 m), weight 75.6 kg, SpO2 97 %.   General: No acute distress Mood and affect are appropriate Heart: Regular rate and rhythm no rubs murmurs or extra sounds Lungs: Clear to auscultation, breathing unlabored, no rales or wheezes Abdomen: Positive bowel sounds, soft nontender to palpation, nondistended Extremities: No clubbing, cyanosis, or edema Skin: No evidence of breakdown, no evidence of rash   Neurologic:, motor strength is 5/5 in L and 0/5 RIght deltoid, bicep, tricep, grip, hip flexor, knee extensors, ankle dorsiflexor and plantar flexor Sensory exam cannot  assess fully due to communication however the patient does wince when fingers and toes are pinched on the right side Musculoskeletal: right shoulder pain with PROM in ER/iR and with ABD Assessment/Plan: 1. Functional deficits secondary to Left MCA with R HP and Aphasia  which require 3+ hours per day of interdisciplinary therapy in a comprehensive inpatient rehab setting.  Physiatrist is providing close team supervision and 24 hour management of active medical problems listed below.  Physiatrist and rehab team continue to assess barriers to discharge/monitor patient progress toward functional and medical goals  Care Tool:  Bathing    Body parts bathed by patient: Chest, Abdomen, Front perineal area, Right upper leg, Left upper leg, Face   Body parts bathed by helper: Right arm, Left arm, Buttocks, Right lower leg, Left lower leg     Bathing assist Assist Level: Maximal Assistance - Patient 24 - 49%     Upper Body Dressing/Undressing Upper body dressing   What is the patient wearing?: Pull over shirt    Upper body assist Assist Level: Maximal Assistance - Patient 25 - 49%    Lower Body Dressing/Undressing Lower body dressing      What is the patient wearing?: Incontinence brief, Pants     Lower body assist Assist for lower body dressing: 2 Helpers     Toileting Toileting    Toileting assist Assist for toileting: 2 Helpers     Transfers Chair/bed transfer  Transfers assist     Chair/bed transfer assist level: Maximal Assistance - Patient 25 - 49%     Locomotion Ambulation   Ambulation assist   Ambulation activity did  not occur: Safety/medical concerns          Walk 10 feet activity   Assist  Walk 10 feet activity did not occur: Safety/medical concerns        Walk 50 feet activity   Assist Walk 50 feet with 2 turns activity did not occur: Safety/medical concerns         Walk 150 feet activity   Assist Walk 150 feet activity did not  occur: Safety/medical concerns         Walk 10 feet on uneven surface  activity   Assist Walk 10 feet on uneven surfaces activity did not occur: Safety/medical concerns         Wheelchair     Assist        Wheelchair assist level: Dependent - Patient 0% Max wheelchair distance: 150 in TIS WC    Wheelchair 50 feet with 2 turns activity    Assist        Assist Level: Dependent - Patient 0%   Wheelchair 150 feet activity     Assist      Assist Level: Dependent - Patient 0%   Blood pressure 105/61, pulse 66, temperature 98.7 F (37.1 C), temperature source Oral, resp. rate 18, height 5' 5"  (1.651 m), weight 75.6 kg, SpO2 97 %.  Medical Problem List and Plan: 1.  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  TEE and loop recorder placement to be discussed as outpatient.             -patient may shower             -ELOS/Goals: 20-22 days MinA PT, OT, SLP  Continue CIR, Team conference today please see physician documentation under team conference tab, met with team  to discuss problems,progress, and goals. Formulized individual treatment plan based on medical history, underlying problem and comorbidities.  2.  Antithrombotics: -DVT/anticoagulation: Subcutaneous heparin.  Venous Doppler studies negative             -antiplatelet therapy: Brilinta 90 mg twice daily, aspirin 81 mg daily 3. Pain Management: Tylenol as needed. Well controlled.   Right shoulder pain, sling for comfort Ask OT  to perform FES to R shoulder  Will check Xray to see if underlying arthritis min AC jt DJD,add tramadol for more severe pain- have discussed corticosteroid injection with pt's daughter  4. Mood: Amantadine 100 mg twice daily             -antipsychotic agents: N/A 5. Neuropsych: This patient is not capable of making decisions on his own behalf. 6. Skin/Wound Care:  Routine skin checks 7. Fluids/Electrolytes/Nutrition: Routine in and outs with follow-up chemistries 8.  Dysphagia.  Dysphagia #1 honey thick liquids.   Dietary follow-up as well as speech therapy 9.  Diabetes mellitus.  Hemoglobin A1c 9.2.  NovoLog 5 units 2 times daily, Lantus insulin 35 units twice daily. Uncontrolled. CBG (last 3)  Recent Labs    04/28/20 1703 04/28/20 2109 04/29/20 0618  GLUCAP 111* 142* 81  am CBG a little low this am will monitor for trend   5/15: CBGs elevated; increase Lantus to 36U- am cbg   5/17 sugars elevate throughout the day   mealtime novolog, 2u tid to start A.m. CBG on low side will reduce Lantus  5/26- BGs adequate- will con't meds, will likely increase after corticosteroid injection  10.  Hypertension.  Cozaar 50 mg daily.   Vitals:   04/28/20 2005 04/29/20 0415  BP: (!) 100/54 105/61  Pulse: 73 66  Resp: 19 18  Temp: 97.8 F (36.6 C) 98.7 F (37.1 C)  SpO2: 97% 97%   5/26- BP well controlled- con't meds  11.  Hyperlipidemia.  Lipitor 12.  Obesity.  BMI 29.50.  Dietary follow-up  13.  Post stroke shoulder pain - discussed with OT, add Kpad and aspercreme, tylenol for pain , no imaging needed at this time no trauma    xray R shoulder shows AC jt arthritis Discussed corticosteroid injection R shoulder , family is agreeable, discussed potential to increase CBG 14. Urine retention--volumes 300-600cc on BVI but only cathed for 148m this am per RN    -urecholine trial 159mtid  Flomax 0.77m55m 5/22- Bladder scans <150cc- voiding OK.     LOS: 13 days A FACE TO FACE EVALUATION WAS PERFORMED  AndCharlett Blake26/2021, 9:19 AM

## 2020-04-29 NOTE — Progress Notes (Signed)
Patient ID: Brad Delacruz, male   DOB: 10-15-1961, 59 y.o.   MRN: [id number]   SNF and [facility] agencies provide to family.

## 2020-04-29 NOTE — Care Management (Addendum)
Patient ID: Brad Delacruz, male   DOB: Jun 29, 1961, 59 y.o.   MRN: [id number]  Met with patient's nurse to review nursing concerns. Discussed incontinence of B+B even with timed toileting. Requiring I+O cath; discussed with PA order for I+O timing clarification due to low volumes and no voiding within 8 hours. CBG readings are good. C/O shoulder pain and MD aware. Nurse to address DM management again with daughter and wife. Interpreter requested for therapy and patient/wife education during the week through discharge.

## 2020-04-29 NOTE — Procedures (Signed)
Shoulder injection RIGHT subacromial   Indication: Right shoulder pain not relieved by medication management and other conservative care.  Informed consent was obtained after describing risks and benefits of the procedure with the patient, his daughter and his wife using Spanish interpreter this includes bleeding, bruising, infection and medication side effects. The patient wishes to proceed and has given written consent. Patient was placed in a seated position. The right shoulder was marked and prepped with chlorhexidine in the subacromial area. A 25-gauge 1-1/2 inch needle was inserted into the subacromial area. After negative draw back for blood, a solution containing 1 mL of 6 mg per ML betamethasone and 4 mL of 1% lidocaine was injected. A band aid was applied. The patient tolerated the procedure well. Post procedure instructions were given.

## 2020-04-29 NOTE — Progress Notes (Signed)
Speech Language Pathology Daily Session Note  Patient Details  Name: Brad Delacruz MRN: [id number] Date of Birth: 02-15-1961  Today's Date: 04/29/2020 SLP Individual Time: 1975-8832 SLP Individual Time Calculation (min): 55 min  Short Term Goals: Week 2: SLP Short Term Goal 1 (Week 2): Patient will demonstrate sustained attention to functional tasks for 15 minutes with Mod verbal cues for redirection. SLP Short Term Goal 2 (Week 2): Patient will complete basic, familiar tasks with mod assist for functional problem solving. SLP Short Term Goal 3 (Week 2): Patient will vocalize on command in 25% of opportunites with Max A multimodal cues. SLP Short Term Goal 4 (Week 2): Patient will utilize gestures to answer basic yes/no questions in 75% of opportunities with Min A verbal and visual cues. SLP Short Term Goal 5 (Week 2): Patient will consume current diet with minimal overt s/s of aspiration and supervision verbal cues for use of swallowing compensatory strategies. SLP Short Term Goal 6 (Week 2): Patient will consume trials of thin liquids via cup sips with minimal overt s/s of aspiration and Min verbal cues for use of swallowing precautions  Skilled Therapeutic Interventions: Skilled ST services focused on education, swallow and language skills. Pt expressed pain in right shoulder and lower back via facial grimaces, respond to yes/no questions and indicated level 3 pain with marker when given a scale of 1-5 on white board. SLP notified nurse and medication was given at the end of the session. Pt's pain continued to impact participation and likely performance in today's treatment session. SLP facilitated automatic language skills, pt demonstrated ability to vocalize "cumpleanos a ti"  x2 , "anos a ti" x6 and vocalized 1/2 of wife's name (suzanna) in the song "fleiz cumpleanos a ti."  Pt vocalized 2 out 5 approxmiations of numbers when counting 1-5 and 3 out 7 days of the week. All automatic language  task required max A verbal cuing and were spoken in spanish. SLP educated daughter, Verlee Monte, how to facilitate automatic language, singing the birthday song, counting 1-5 and days of the week; having pt listen, then imitate words, then attempt voicing and finally fade out vocal support. Pt vocalized "no" approximately x15 during session to express wants/needs and in response to yes/no questions. SLP facilitated object naming in response to yes/no questions, pt demonstrated 2 out 4 accuracy in initial trial and then 4 out 4 accuracy in secondary trials with same objects and max A demonstration cues, indicating carryover of concepts.Pt was able to identify by pointing to requested object in a field of 2 with 50% accuracy. Pt's participation began to fade towards the end of the treatment session, keeping eyes closed and indicated due to pain and possible double vision, via response to yes/no questions. Pt was left in room with family, call bell within reach and bed alarm set. ST recommends to continue skilled ST services.       Pain Pain Assessment Pain Score: 0-No pain  Therapy/Group: Individual Therapy  Kimyata Milich  [facility] 04/29/2020, 9:44 AM

## 2020-04-29 NOTE — Progress Notes (Signed)
Occupational Therapy Session Note  Patient Details  Name: Brad Delacruz MRN: [id number] Date of Birth: Apr 22, 1961  Today's Date: 04/29/2020 OT Individual Time: 0904-1000 OT Individual Time Calculation (min): 56 min    Short Term Goals: Week 1:  OT Short Term Goal 1 (Week 1): Pt will maintain static sitting balance for 2 minutes with max A. OT Short Term Goal 1 - Progress (Week 1): Met OT Short Term Goal 2 (Week 1): Pt will locate self care items from R side of sink for grooming tasks with min cuing. OT Short Term Goal 2 - Progress (Week 1): Met OT Short Term Goal 3 (Week 1): Pt will perform UB dressing with mod A overall. OT Short Term Goal 3 - Progress (Week 1): Progressing toward goal  Skilled Therapeutic Interventions/Progress Updates:    Pt greeted at time of session reclined in bed with HOB elevated, interpreter and daughter and wife in the room. Bed mobility tasks to transition from supine to sitting up at EOB with Min/Mod A with pt able to bring BLEs to EOB and bring UB up to sitting with assistance. Adjusted sling for RUE to support humerus in joint in attempt to decrease shoulder pain, family ed provided on bed level and wheelchair level positioning to support shoulder joint. Pt showing signs of restlessness and urgency, squat pivot transfer to TIS chair with Max A and brought to bathroom to set up at toilet. Sit to stand with cues to utilize grab bar with Mod A, SPT to toilet with Mod A to guide hips and support RLE, dependent to doff pants and change brief. Pt did sit on commode for 10+ minutes showing signs that he needed to void, pt did urinate but unable to have BM. Sit to stand from toilet with Max A with use of grab bar, dependent for donning brief and pants over hips, SPT back to TIS chair with max/total A d/t pushing behaviors toward R side and decreased ability to let go of LUE from grab bar. UB dressing with Mod A with assist to thread RUE and bring around shoulders, pt able  to thread LUE and assist with buttoning. Oral hygiene performed at sink level with pt perseverating on toothpaste and spitting out water, cues throughout for attending to tasks on R side for visual scanning and decreasing neglect. Hand over hand assist required to bring brush to his mouth to initiate brushing teeth. Pt's oral cavity suctioned to ensure no more food residue. Pt left in chair with alarm on, all needs met, and family in the room.  Therapy Documentation Precautions:  Precautions Precautions: Fall Restrictions Weight Bearing Restrictions: No    Therapy/Group: Individual Therapy  Viona Gilmore 04/29/2020, 10:24 AM

## 2020-04-29 NOTE — Patient Care Conference (Signed)
Inpatient RehabilitationTeam Conference and Plan of Care Update Date: 04/29/2020   Time: 1:54 PM    Patient Name: Brad Delacruz      Medical Record Number: [id number]  Date of Birth: 11/25/1961 Sex: Male         Room/Bed: 4W23C/4W23C-01 Payor Info: Payor: [place] / Plan: BCBS COMM PPO / Product Type: *No Product type* /    Admit Date/Time:  04/16/2020  3:38 PM  Primary Diagnosis:  Left middle cerebral artery stroke [facility])  Patient Active Problem List   Diagnosis Date Noted  . SAH (subarachnoid hemorrhage) ([place]) 04/16/2020  . Cerebral edema ([place]) 04/16/2020  . Pneumonia 04/16/2020  . Hypertensive emergency 04/16/2020  . Hyperlipidemia 04/16/2020  . Diabetes mellitus type II, uncontrolled ([place]) 04/16/2020  . Dysphagia 04/16/2020  . Protein-calorie malnutrition ([place]) 04/16/2020  . Urinary retention 04/16/2020  . Left middle cerebral artery stroke ([place]) 04/16/2020  . Acute respiratory failure ([place])   . Acute ischemic stroke ([place]) L MCA d/t L M1 oclusion s/p MCA stent 03/24/2020  . CVA (cerebral vascular accident) ([place]) 03/24/2020  . Middle cerebral artery embolism, left 03/24/2020    Expected Discharge Date: Expected Discharge Date: (SNF recommended due to high level of care needs)  Team Members Present: Physician leading conference: Dr. Alysia Penna Care Coodinator Present: Nestor Lewandowsky, RN, BSN, CRRN;Christina [place], [place] Nurse Present: Genene Churn, RN PT Present: Barrie Folk, PT OT Present: Darleen Crocker, OT SLP Present: Charolett Bumpers, SLP PPS Coordinator present : Ileana Ladd, PT     Current Status/Progress Goal Weekly Team Focus  Bowel/Bladder   incontinent of bowel and bladder; bladder scan q6 adn I&O cath q6 for no void; lbm 5/24  Continence of bowel and bladder, with no urinary retention  OOB to void, train b&b   Swallow/Nutrition/ Hydration   dys 3 and thin, min A  Supervision A  tolerance of dys 3 and thin, increase right side  sensation, swallow strategies (anterior right spillage and oral clearance) and trials of regular textures   ADL's   +2 helpers bathing shower level sit<stand, Mod A UB dressing, +2 helpers LB dressing sit<stand at sink, Dependent toileting and toilet transfer using Stedy  mod A LB and toileting with min A all other goals  Self care retraining, sitting/standing balance, NMR, praxis, functional transfers   Mobility   mod/maxA bed mobilty, maxA SB and squat pivot transfers, maxA STS from stedy, CGA static sitting balance, min/modA dynamic sitting balance  modA with minA for w/c mobilty  sitting balance, transfers, standing tolerance   Communication   Max A vocalizing, Min A yes/no, Max A automatic language  Min A auditory comprehenison, Min A express/wants need multimodal and Max A naming/verbalizing  automatic language, vocalizing vowels/basic phonemes, yes/no accuracy, object identification and basic auditory comprehension   Safety/Cognition/ Behavioral Observations  Min A sustained attention (increased)  Mod A emergent awareness, Supervision A sustained attention  sustained attention, basic problem solving to address awareness   Pain   pain to R shoulder  reduce pain to tolerable level  assess q shift/prn. medicate prn and as ordered   Skin   MASD to groin, bruising to abdomen  skin free of further breakdown/infection  assess q shift/prn; barrier cream    Rehab Goals Patient on target to meet rehab goals: Yes Rehab Goals Revised: on targe with current goals *See Care Plan and progress notes for long and short-term goals.     Barriers to Discharge  Current Status/Progress Possible Resolutions  Date Resolved   Nursing                  PT                    OT                  SLP                Care Coordinator                Discharge Planning/Teaching Needs:  Goal to discharge home with spouse to provide care.  will schedule if needed   Team Discussion:  Right shoulder pain  treated with additional medication. Continue to require I+O cath in the morning; incontinence of B+B. Progression and improved awareness limited by slow initiation and distraction ease. Aphasia impairs ability to communicate needs and discomfort.  Revisions to Treatment Plan:  Recommend SNF due to level of care required for ADLs, transfers, management of incontinence and overall care    Medical Summary Current Status: Right shoulder pain increased post stroke, urinary retention with some improvement Weekly Focus/Goal: Further adjustment of pain medications, possible injection right shoulder  Barriers to Discharge: Other (comments)  Barriers to Discharge Comments: Pain related to hemiplegic shoulder Possible Resolutions to Barriers: Medication management, injection   Continued Need for Acute Rehabilitation Level of Care: The patient requires daily medical management by a physician with specialized training in physical medicine and rehabilitation for the following reasons: Direction of a multidisciplinary physical rehabilitation program to maximize functional independence : Yes Medical management of patient stability for increased activity during participation in an intensive rehabilitation regime.: Yes Analysis of laboratory values and/or radiology reports with any subsequent need for medication adjustment and/or medical intervention. : Yes   I attest that I was present, lead the team conference, and concur with the assessment and plan of the team.   Chana Bode B 04/29/2020, 1:54 PM

## 2020-04-30 ENCOUNTER — Inpatient Hospital Stay (HOSPITAL_COMMUNITY): Payer: BC Managed Care – PPO | Admitting: Speech Pathology

## 2020-04-30 ENCOUNTER — Inpatient Hospital Stay (HOSPITAL_COMMUNITY): Payer: BC Managed Care – PPO | Admitting: Physical Therapy

## 2020-04-30 ENCOUNTER — Inpatient Hospital Stay (HOSPITAL_COMMUNITY): Payer: BC Managed Care – PPO | Admitting: Occupational Therapy

## 2020-04-30 LAB — GLUCOSE, CAPILLARY
Glucose-Capillary: 191 mg/dL — ABNORMAL HIGH (ref 70–99)
Glucose-Capillary: 215 mg/dL — ABNORMAL HIGH (ref 70–99)
Glucose-Capillary: 165 mg/dL — ABNORMAL HIGH (ref 70–99)
Glucose-Capillary: 232 mg/dL — ABNORMAL HIGH (ref 70–99)

## 2020-04-30 NOTE — Progress Notes (Addendum)
[place] PHYSICAL MEDICINE & REHABILITATION PROGRESS NOTE   Subjective/Complaints:  No issues overnite , family concerned about bruising from lovenox, discussed elevated CBG as well as hand swelling in post CVA upper ext   ROS: limited by language/aphasia  Objective:   No results found. No results for input(s): WBC, HGB, HCT, PLT in the last 72 hours. No results for input(s): NA, K, CL, CO2, GLUCOSE, BUN, CREATININE, CALCIUM in the last 72 hours.  Intake/Output Summary (Last 24 hours) at 04/30/2020 0759 Last data filed at 04/30/2020 0423 Gross per 24 hour  Intake 820 ml  Output 700 ml  Net 120 ml     Physical Exam: Vital Signs Blood pressure 96/67, pulse 80, temperature 98 F (36.7 C), temperature source Oral, resp. rate 17, height 5\' 5"  (1.651 m), weight 75.6 kg, SpO2 98 %.  General: No acute distress Mood and affect are appropriate Heart: Regular rate and rhythm no rubs murmurs or extra sounds Lungs: Clear to auscultation, breathing unlabored, no rales or wheezes Abdomen: Positive bowel sounds, soft nontender to palpation, nondistended Extremities: No clubbing, cyanosis, or edema Skin: No evidence of breakdown, no evidence of rash    Neurologic:, motor strength is 5/5 in L and 0/5 RIght deltoid, bicep, tricep, grip, hip flexor, knee extensors, ankle dorsiflexor and plantar flexor Sensory exam cannot assess fully due to communication however the patient does wince when fingers and toes are pinched on the right side Musculoskeletal: right shoulder pain with PROM in ER/iR and with ABD Assessment/Plan: 1. Functional deficits secondary to Left MCA with R HP and Aphasia  which require 3+ hours per day of interdisciplinary therapy in a comprehensive inpatient rehab setting.  Physiatrist is providing close team supervision and 24 hour management of active medical problems listed below.  Physiatrist and rehab team continue to assess barriers to discharge/monitor patient  progress toward functional and medical goals  Care Tool:  Bathing    Body parts bathed by patient: Chest, Abdomen, Front perineal area, Right upper leg, Left upper leg, Face   Body parts bathed by helper: Right arm, Left arm, Buttocks, Right lower leg, Left lower leg     Bathing assist Assist Level: Maximal Assistance - Patient 24 - 49%     Upper Body Dressing/Undressing Upper body dressing   What is the patient wearing?: Pull over shirt    Upper body assist Assist Level: Maximal Assistance - Patient 25 - 49%    Lower Body Dressing/Undressing Lower body dressing      What is the patient wearing?: Incontinence brief, Pants     Lower body assist Assist for lower body dressing: 2 Helpers     Toileting Toileting    Toileting assist Assist for toileting: 2 Helpers     Transfers Chair/bed transfer  Transfers assist     Chair/bed transfer assist level: Maximal Assistance - Patient 25 - 49%     Locomotion Ambulation   Ambulation assist   Ambulation activity did not occur: Safety/medical concerns          Walk 10 feet activity   Assist  Walk 10 feet activity did not occur: Safety/medical concerns        Walk 50 feet activity   Assist Walk 50 feet with 2 turns activity did not occur: Safety/medical concerns         Walk 150 feet activity   Assist Walk 150 feet activity did not occur: Safety/medical concerns         Walk 10 feet  on uneven surface  activity   Assist Walk 10 feet on uneven surfaces activity did not occur: Safety/medical concerns         Wheelchair     Assist        Wheelchair assist level: Dependent - Patient 0% Max wheelchair distance: 150 in TIS WC    Wheelchair 50 feet with 2 turns activity    Assist        Assist Level: Dependent - Patient 0%   Wheelchair 150 feet activity     Assist      Assist Level: Dependent - Patient 0%   Blood pressure 96/67, pulse 80, temperature 98 F (36.7  C), temperature source Oral, resp. rate 17, height 5\' 5"  (1.651 m), weight 75.6 kg, SpO2 98 %.  Medical Problem List and Plan: 1.  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  TEE and loop recorder placement to be discussed as outpatient.             -patient may shower             -ELOS/Goals: 20-22 days MinA PT, OT, SLP Cont PT, OT, SLP  2.  Antithrombotics: -DVT/anticoagulation: Subcutaneous heparin.  Venous Doppler studies negative             -antiplatelet therapy: Brilinta 90 mg twice daily, aspirin 81 mg daily 3. Pain Management: Tylenol as needed. Well controlled.   Right shoulder pain, sling for comfort Ask OT  to perform FES to R shoulder  Will check Xray to see if underlying arthritis min AC jt DJD,add tramadol for more severe pain-day #1 post corticosteroid inj  4. Mood: Amantadine 100 mg twice daily             -antipsychotic agents: N/A 5. Neuropsych: This patient is not capable of making decisions on his own behalf. 6. Skin/Wound Care: Routine skin checks 7. Fluids/Electrolytes/Nutrition: Routine in and outs with follow-up chemistries 8.  Dysphagia.  Dysphagia #1 honey thick liquids.   Dietary follow-up as well as speech therapy 9.  Diabetes mellitus.  Hemoglobin A1c 9.2.  NovoLog 5 units 2 times daily, Lantus insulin 35 units twice daily. Uncontrolled. CBG (last 3)  Recent Labs    04/29/20 1646 04/29/20 2102 04/30/20 0608  GLUCAP 197* 264* 191*  CBGs elevated due to steroid, should subside over the next 2-3 d  5/15:  increase Lantus to 36U-      mealtime novolog, 2u tid  A.m. CBG on low side will reduce Lantus  5/26- BGs adequate- will con't meds, will likely increase after corticosteroid injection  10.  Hypertension.  Cozaar 50 mg daily.   Vitals:   04/29/20 2024 04/30/20 0415  BP: 111/64 96/67  Pulse: 90 80  Resp: 17 17  Temp: 98.4 F (36.9 C) 98 F  (36.7 C)  SpO2: 96% 98%   5/27- BP well controlled- con't meds  11.  Hyperlipidemia.  Lipitor 12.  Obesity.  BMI 29.50.  Dietary follow-up  13.  Post stroke shoulder pain - discussed with OT, add Kpad and aspercreme, tylenol for pain , no imaging needed at this time no trauma    xray R shoulder shows AC jt arthritis  corticosteroid injection R shoulder performed 5/26       14. Urine retention--volumes varoable  -urecholine trial 10mg  tid  Flomax 0.4mg    5/22- Bladder scans <150cc- voiding OK.  LOS: 14 days A FACE TO FACE EVALUATION WAS PERFORMED  Erick Colace 04/30/2020, 7:59 AM

## 2020-04-30 NOTE — Progress Notes (Signed)
Speech Language Pathology Daily Session Note  Patient Details  Name: Manav Pierotti MRN: [id number] Date of Birth: May 28, 1961  Today's Date: 04/30/2020 SLP Individual Time: 8416-6063 SLP Individual Time Calculation (min): 60 min  Short Term Goals: Week 2: SLP Short Term Goal 1 (Week 2): Patient will demonstrate sustained attention to functional tasks for 15 minutes with Mod verbal cues for redirection. SLP Short Term Goal 2 (Week 2): Patient will complete basic, familiar tasks with mod assist for functional problem solving. SLP Short Term Goal 3 (Week 2): Patient will vocalize on command in 25% of opportunites with Max A multimodal cues. SLP Short Term Goal 4 (Week 2): Patient will utilize gestures to answer basic yes/no questions in 75% of opportunities with Min A verbal and visual cues. SLP Short Term Goal 5 (Week 2): Patient will consume current diet with minimal overt s/s of aspiration and supervision verbal cues for use of swallowing compensatory strategies. SLP Short Term Goal 6 (Week 2): Patient will consume trials of thin liquids via cup sips with minimal overt s/s of aspiration and Min verbal cues for use of swallowing precautions  Skilled Therapeutic Interventions: Skilled treatment session focused on dysphagia and communication goals. SLP facilitated session by providing skilled observation with lunch meal of Dys. 3 textures with thin liquids. Patient required supervision level verbal cues to clear right buccal pocketing and Mod verbal and visual cues to attend to right anterior spillage. Patient demonstrated 2 overt coughing episodes at end of meal, suspect due to large sips of thin liquids. Recommend patient continue current diet. SLP also facilitated session by attempting to have patient identify a word from a field of 2 with hopes of maximizing functional communication with use of a communication board. Patient demonstrated difficulty with task due to visual deficits and motor  planning deficits.  Patient independently utilized the word "no" X 3 and attempted to verbalize at the phrase level X 2 but it was unintelligible. Patient transferred back to bed at end of session and left with alarm on and all needs within reach. Continue with current plan of care.      Pain No/Denies Pain   Therapy/Group: Individual Therapy  Kamorie Aldous 04/30/2020, 3:28 PM

## 2020-04-30 NOTE — Progress Notes (Signed)
Occupational Therapy Session Note  Patient Details  Name: Brad Delacruz MRN: [id number] Date of Birth: 08/17/1961  Today's Date: 04/30/2020 OT Individual Time:  9:05- 10:00   55 minutes   Short Term Goals: Week 2:  OT Short Term Goal 1 (Week 2): Pt will complete UB dressing with Mod A OT Short Term Goal 2 (Week 2): Pt will complete toilet transfer with 1 assist without Stedy OT Short Term Goal 3 (Week 2): Pt will complete 1/3 components of donning pants with CGA  Skilled Therapeutic Interventions/Progress Updates:    Pt greeted at time of session reclined in bed with interpreter and family in the room. Bed mobility tasks supine to sitting up at EOB with CGA with pt able to initiate and manage BLEs to EOB. Once seated at EOB, therapist donned sling for comfort and to support humerus in shoulder joint for transfer. Squat pivot to manual wheelchair with Mod A this date with pt able to assist. Set up at toilet, squat pivot transfer to toilet with pt using grab bar toward his unaffected side with Mod A, dependent to doff brief for toileting. Pt did void both BM and urine this date. Sit to stand Max A from standard toilet, dependent to don new brief, pants, and for hygiene. Squat pivot transfer toilet to wheelchair Max A (toward affected side). Pt set up at sink for hygiene, oral hygiene with hand over hand assist for bringing toothbrush to mouth, max A to brush R side of oral cavity d/t neglect and pt did perseverate on spitting after rinsing. Pt also attempted to brush teeth with hairbrush and perseverated on brushing motion, requiring hand over hand assist to redirect. Pt up in chair all needs met, family and interpreter in room, alarm on.   Therapy Documentation Precautions:  Precautions Precautions: Fall Restrictions Weight Bearing Restrictions: No   Therapy/Group: Individual Therapy  Viona Gilmore 04/30/2020, 8:59 AM

## 2020-04-30 NOTE — Progress Notes (Signed)
Physical Therapy Weekly Progress Note  Patient Details  Name: Brad Delacruz MRN: [id number] Date of Birth: 06/25/1961  Beginning of progress report period: Apr 25, 2020 End of progress report period: Apr 30, 2020  Today's Date: 04/30/2020 PT Individual Time: 0901-1015 PT Individual Time Calculation (min): 74 min   Patient has met 4 of 4 short term goals.  Pt continues to make steady progress towards Mod assist level goals. Currently, pt has progressed to SB transfer to and from [facility] with max-mod assist of 1, WC mobility with hemi-technique and min assist up to 17f, and mod assist for bed mobility using bed features. Visual deficits along with R side neglect, and severe R side hemiplegia have limited increased progress at this time. Pt will benefit from extensive family education over the next week due to continued deficits and need for 24/7 care upon d/c    Patient continues to demonstrate the following deficits muscle weakness, muscle joint tightness and muscle paralysis, decreased cardiorespiratoy endurance, abnormal tone, unbalanced muscle activation, motor apraxia and decreased motor planning, decreased visual acuity, decreased visual perceptual skills, decreased visual motor skills and field cut, decreased midline orientation, decreased attention to right, right side neglect and ideational apraxia, decreased initiation, decreased attention, decreased awareness, decreased problem solving, decreased safety awareness, decreased memory and delayed processing and decreased sitting balance, decreased standing balance, decreased postural control, hemiplegia and decreased balance strategies and therefore will continue to benefit from skilled PT intervention to increase functional independence with mobility.  Patient progressing toward long term goals..  Continue plan of care.  PT Short Term Goals Week 2:  PT Short Term Goal 1 (Week 2): [facility]  consistently PT Short Term Goal 1 - Progress (Week 2): Met PT Short Term Goal 2 (Week 2): Pt will bed able to scan to R side 25-50% of time during functional activities PT Short Term Goal 2 - Progress (Week 2): Met PT Short Term Goal 3 (Week 2): Pt will maintain sitting balance EOB with min A consistently PT Short Term Goal 3 - Progress (Week 2): Met PT Short Term Goal 4 (Week 2): Pt will initiate WC mobility training PT Short Term Goal 4 - Progress (Week 2): Met Week 3:  PT Short Term Goal 1 (Week 3): Pt will propel WC 1074fwith min assistusing hemi technique. PT Short Term Goal 2 (Week 3): [facility] Short Term Goal 3 (Week 3): Pt will ambulate 2536fith max assist of 1 and UE on rail to force use of RLE PT Short Term Goal 4 (Week 3): pt will perform bed mobiltiy with mod assist consistently  Skilled Therapeutic Interventions/Progress Updates:   Pt received sitting in WC and agreeable to PT. Pt instructed in WC mobility x 120f50fth min assist using LLE only. PT attempted to have pt engage use of LUE, but was unable to maintain straight path with use of UE and LE. Gait training at rail in hall x 10ft40f 12ft 50f total A+2 withDF wrap on the RLE. No active extension noted in the RLE on this day with increasing pushing with fatigue.   SB transfer to mat table with mod assist to the R and max cues for improved push up prior to lateral scooting. Deep core NMR to perform sit up from elevated position from large wedge 2 x 10 with min-mod assist to improve activation and alignment  of R sided postural muscles. Lateral reach L x 10 with CGA to stabilize the LUE. Lateral reach R with RUE stabilized on the table x 10. Mod assist to return to midline intermittently at end range. Lateral scooting R and L at EOM.   UE NMR AAROM chest press in scaption, mild discomfort noted. Ball wrapper to hand and performed lateral reaches R and L to force use of BUE, x 10 Bil with  max assist to initiate movement on the R, and improve trunk rotation. AAROM wrist supination/pronation. bcicep flexion x 10 .   SB transfer to [facility] and then to bed with mod assist on the R with LLE blocked and mod cues for sequencing. .sit>supine with mod assist for RLE management and RUE in sling. Pt left with call bell in reach and all needs met.               Therapy Documentation Precautions:  Precautions Precautions: Fall Restrictions Weight Bearing Restrictions: No Pain: Faces. Hurts a little. Shoulder. Sling adjusted   Therapy/Group: Individual Therapy  Lorie Phenix 04/30/2020, 3:32 PM

## 2020-04-30 NOTE — Plan of Care (Signed)
  Problem: RH BLADDER ELIMINATION Goal: RH STG MANAGE BLADDER WITH ASSISTANCE Description: STG Manage Bladder With min Assistance Outcome: Not Progressing; time toileting   

## 2020-05-01 ENCOUNTER — Inpatient Hospital Stay (HOSPITAL_COMMUNITY): Payer: BC Managed Care – PPO | Admitting: Occupational Therapy

## 2020-05-01 ENCOUNTER — Inpatient Hospital Stay (HOSPITAL_COMMUNITY): Payer: BC Managed Care – PPO | Admitting: Speech Pathology

## 2020-05-01 ENCOUNTER — Inpatient Hospital Stay (HOSPITAL_COMMUNITY): Payer: BC Managed Care – PPO | Admitting: Physical Therapy

## 2020-05-01 LAB — GLUCOSE, CAPILLARY
Glucose-Capillary: 104 mg/dL — ABNORMAL HIGH (ref 70–99)
Glucose-Capillary: 247 mg/dL — ABNORMAL HIGH (ref 70–99)
Glucose-Capillary: 197 mg/dL — ABNORMAL HIGH (ref 70–99)
Glucose-Capillary: 259 mg/dL — ABNORMAL HIGH (ref 70–99)

## 2020-05-01 NOTE — Progress Notes (Signed)
Occupational Therapy Session Note  Patient Details  Name: Brad Delacruz MRN: [id number] Date of Birth: 1961/03/13  Today's Date: 05/01/2020 OT Individual Time: 6433-2951 OT Individual Time Calculation (min): 55 min   Short Term Goals: Week 2:  OT Short Term Goal 1 (Week 2): Pt will complete UB dressing with Mod A OT Short Term Goal 2 (Week 2): Pt will complete toilet transfer with 1 assist without Stedy OT Short Term Goal 3 (Week 2): Pt will complete 1/3 components of donning pants with CGA  Skilled Therapeutic Interventions/Progress Updates:    Pt greeted at time of session supine in bed with HOB elevated, agreeable to OT session. Session focused on functional transfers and skill performance/sequencing during bathing and dressing tasks. Bed mobility for supine to sitting up at EOB with supervision, squat pivot transfer bed > manual wheelchair with Mod A (toward his L side), brought to bathroom and squat pivot transfer toward his L side with Mod A with use of grab bar to tub transfer bench. UB bathing Mod A and LB Max A. Tried to facilitate seated figure 4 position bilaterally however pt exhibited signs of pain with attempt and therefore OT just washed his feet. No s/s pain with HOH to use his Rt arm to wash the Lt side today. Pt noted to be impulsive during shower, attempting to stand and 1 LOB to the R when attempting to wash buttocks. Stand pivot from shower bench to wheelchair toward R side/affected side with pt utilizing grab bar to pull up and therapist assist to guide hips with Max A and CGA of 2nd helper. LB dressing with Max A sit<stand at sink after with therapist assist to thread pants over feet but pt did assist with forward weight shifting to reach BLEs, static stand with blocking of RLE, patient did assist in partially donning over hips on L side. Donning socks with Mod A, CGA for LLE and increased assistance for RLE. UB dressing Mod A with cues for hemi dressing techniques to dress  RUE first and pt able to thread LUE and don over head. Oral hygiene seated at sink level with Mod A and hand over hand assist to bring brush to mouth and initiate brushing motion, assist for thoroughness. Less food residue noted this date after oral care. Grooming at sink level for brushing hair with Min A after set up with cues for attending to R side of head. Pt properly positioned with R sling and pillow for support. Pt up in chair with alarm on, all needs met.  Therapy Documentation Precautions:  Precautions Precautions: Fall Restrictions Weight Bearing Restrictions: No  Pain: Noted some grimacing at times when Rt UE was moved during session. Used positioning strategies to address during and at end of session. Also notified RN of s/s Rt arm pain at end of session.  Therapy/Group: Individual Therapy  Viona Gilmore 05/01/2020, 8:32 AM

## 2020-05-01 NOTE — Progress Notes (Signed)
Speech Language Pathology Weekly Progress and Session Note  Patient Details  Name: Brad Delacruz MRN: [id number] Date of Birth: 01-30-1961  Beginning of progress report period: Apr 24, 2020 End of progress report period: May 01, 2020  Today's Date: 05/01/2020 SLP Individual Time: 1301-1330 SLP Individual Time Calculation (min): 29 min  Short Term Goals: Week 2: SLP Short Term Goal 1 (Week 2): Patient will demonstrate sustained attention to functional tasks for 15 minutes with Mod verbal cues for redirection. SLP Short Term Goal 1 - Progress (Week 2): Met SLP Short Term Goal 2 (Week 2): Patient will complete basic, familiar tasks with mod assist for functional problem solving. SLP Short Term Goal 2 - Progress (Week 2): Progressing toward goal SLP Short Term Goal 3 (Week 2): Patient will vocalize on command in 25% of opportunites with Max A multimodal cues. SLP Short Term Goal 3 - Progress (Week 2): Met SLP Short Term Goal 4 (Week 2): Patient will utilize gestures to answer basic yes/no questions in 75% of opportunities with Min A verbal and visual cues. SLP Short Term Goal 4 - Progress (Week 2): Progressing toward goal SLP Short Term Goal 5 (Week 2): Patient will consume current diet with minimal overt s/s of aspiration and supervision verbal cues for use of swallowing compensatory strategies. SLP Short Term Goal 5 - Progress (Week 2): Met SLP Short Term Goal 6 (Week 2): Patient will consume trials of thin liquids via cup sips with minimal overt s/s of aspiration and Min verbal cues for use of swallowing precautions SLP Short Term Goal 6 - Progress (Week 2): Met    New Short Term Goals: Week 3: SLP Short Term Goal 1 (Week 3): Patient will demonstrate sustained attention to functional tasks for 15 minutes with Min verbal cues for redirection. SLP Short Term Goal 2 (Week 3): Patient will complete basic, familiar tasks with mod assist for functional problem solving. SLP Short Term Goal 3  (Week 3): Patient will vocalize on command in 50% of opportunites with Max A multimodal cues. SLP Short Term Goal 4 (Week 3): Patient will utilize gestures to answer basic yes/no questions in 75% of opportunities with Min A verbal and visual cues. SLP Short Term Goal 5 (Week 3): Patient will consume current diet with minimal overt s/s of aspiration Mod I for use of swallowing compensatory strategies.  Weekly Progress Updates: Pt has made functional gains and met 4 out of 6 short term goals. Pt currently requires Max A for functional commnication via gestures and verbal output due to aphasia and apraxia. He participated in repeat MBSS this reporting period and was upgraded to current dysphagia 3 (mech soft) diet with thin liquids. Pt has demonstrated improved ability to vocalize on command, answer yes/no questions via gestures, sustained attention, and oropharyngeal swallow function. Pt and family education is ongoing. Pt would continue to benefit from skilled ST while inpatient in order to maximize functional independence and reduce burden of care prior to discharge. Anticipate that pt will need 24/7 supervision at discharge in addition to [place] follow up at next level of care.      Intensity: Minumum of 1-2 x/day, 30 to 90 minutes Frequency: 3 to 5 out of 7 days Duration/Length of Stay: 05/13/20 vs SNF Treatment/Interventions: Cognitive remediation/compensation;Dysphagia/aspiration precaution training;Internal/external aids;Speech/Language facilitation;Therapeutic Activities;Environmental controls;Cueing hierarchy;Functional tasks;Patient/family education   Daily Session  Skilled Therapeutic Interventions: Pt was seen for skilled ST targeting communication goals. Spansh interpretor and wife present throughout session. Pt participated in automatic speech tasks  to increase verbal output. Pt counted from 1-10 with 80% accuracy and overall Min A phonetic cues. Pt demonstrated increased difficulty reciting  months of the year even with increased phonetic and semantic cues. Mild perseveration on word "no" noted during session. Pt identified common objects with yes/no gestures and occasionally verbal response with 7/10 accuracy today. Identifying objects from field of 2 via pointing proved more difficult, resulting in ~30% accuracy despite Max A semantic cues. Pt left sitting in chair with alarm set and needs within reach, wife and interpretor still present.      Pain Pain Assessment Pain Scale: 0-10 Pain Score: 0-No pain  Therapy/Group: Individual Therapy  Brad Delacruz 05/01/2020, 7:24 AM

## 2020-05-01 NOTE — Progress Notes (Signed)
[facility] PHYSICAL MEDICINE & REHABILITATION PROGRESS NOTE   Subjective/Complaints:  Remains globally aphasic   ROS: limited by language/aphasia  Objective:   No results found. No results for input(s): WBC, HGB, HCT, PLT in the last 72 hours. No results for input(s): NA, K, CL, CO2, GLUCOSE, BUN, CREATININE, CALCIUM in the last 72 hours.  Intake/Output Summary (Last 24 hours) at 05/01/2020 0924 Last data filed at 05/01/2020 0725 Gross per 24 hour  Intake 360 ml  Output --  Net 360 ml     Physical Exam: Vital Signs Blood pressure 115/61, pulse (!) 55, temperature 97.8 F (36.6 C), temperature source Oral, resp. rate 17, height 5\' 5"  (1.651 m), weight 75.6 kg, SpO2 95 %.   General: No acute distress Mood and affect are appropriate Heart: Regular rate and rhythm no rubs murmurs or extra sounds Lungs: Clear to auscultation, breathing unlabored, no rales or wheezes Abdomen: Positive bowel sounds, soft nontender to palpation, nondistended Extremities: No clubbing, cyanosis, or edema Skin: No evidence of breakdown, no evidence of rash   Neurologic:, motor strength is 5/5 in L and 0/5 RIght deltoid, bicep, tricep, grip, hip flexor, knee extensors, ankle dorsiflexor and plantar flexor Sensory exam cannot assess fully due to communication however the patient does wince when fingers and toes are pinched on the right side Musculoskeletal: NO right shoulder pain with PROM in ER/iR or with ABD Assessment/Plan: 1. Functional deficits secondary to Left MCA with R HP and Aphasia  which require 3+ hours per day of interdisciplinary therapy in a comprehensive inpatient rehab setting.  Physiatrist is providing close team supervision and 24 hour management of active medical problems listed below.  Physiatrist and rehab team continue to assess barriers to discharge/monitor patient progress toward functional and medical goals  Care Tool:  Bathing    Body parts bathed by patient: Chest,  Abdomen, Front perineal area, Right upper leg, Left upper leg, Face   Body parts bathed by helper: Right arm, Left arm, Buttocks, Right lower leg, Left lower leg     Bathing assist Assist Level: Maximal Assistance - Patient 24 - 49%     Upper Body Dressing/Undressing Upper body dressing   What is the patient wearing?: Pull over shirt    Upper body assist Assist Level: Moderate Assistance - Patient 50 - 74%    Lower Body Dressing/Undressing Lower body dressing      What is the patient wearing?: Incontinence brief, Pants     Lower body assist Assist for lower body dressing: Maximal Assistance - Patient 25 - 49%     Toileting Toileting    Toileting assist Assist for toileting: 2 Helpers     Transfers Chair/bed transfer  Transfers assist     Chair/bed transfer assist level: Moderate Assistance - Patient 50 - 74%     Locomotion Ambulation   Ambulation assist   Ambulation activity did not occur: Safety/medical concerns  Assist level: 2 helpers Assistive device: Other (comment)(rail) Max distance: 15   Walk 10 feet activity   Assist  Walk 10 feet activity did not occur: Safety/medical concerns  Assist level: 2 helpers Assistive device: Other (comment)   Walk 50 feet activity   Assist Walk 50 feet with 2 turns activity did not occur: Safety/medical concerns         Walk 150 feet activity   Assist Walk 150 feet activity did not occur: Safety/medical concerns         Walk 10 feet on uneven surface  activity  Assist Walk 10 feet on uneven surfaces activity did not occur: Safety/medical concerns         Wheelchair     Assist        Wheelchair assist level: Minimal Assistance - Patient > 75% Max wheelchair distance: 120    Wheelchair 50 feet with 2 turns activity    Assist        Assist Level: Minimal Assistance - Patient > 75%   Wheelchair 150 feet activity     Assist      Assist Level: Moderate Assistance -  Patient 50 - 74%   Blood pressure 115/61, pulse (!) 55, temperature 97.8 F (36.6 C), temperature source Oral, resp. rate 17, height 5\' 5"  (1.651 m), weight 75.6 kg, SpO2 95 %.  Medical Problem List and Plan: 1.  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  TEE and loop recorder placement to be discussed as outpatient.             -patient may shower             -ELOS/Goals: 20-22 days MinA PT, OT, SLP Cont PT, OT, SLP  2.  Antithrombotics: -DVT/anticoagulation: Subcutaneous heparin.  Venous Doppler studies negative             -antiplatelet therapy: Brilinta 90 mg twice daily, aspirin 81 mg daily 3. Pain Management: Tylenol as needed. Well controlled.   Right shoulder pain, sling for comfort- improved after R subacromial injection   4. Mood: Amantadine 100 mg twice daily             -antipsychotic agents: N/A 5. Neuropsych: This patient is not capable of making decisions on his own behalf. 6. Skin/Wound Care: Routine skin checks 7. Fluids/Electrolytes/Nutrition: Routine in and outs with follow-up chemistries 8.  Dysphagia.  Dysphagia #1 honey thick liquids.   Dietary follow-up as well as speech therapy 9.  Diabetes mellitus.  Hemoglobin A1c 9.2.  NovoLog 5 units 2 times daily, Lantus insulin 35 units twice daily. Uncontrolled. CBG (last 3)  Recent Labs    04/30/20 1645 04/30/20 2102 05/01/20 0620  GLUCAP 165* 232* 104*  CBGs elevated due to steroid, should subside over the next 1-2 d  5/15:  increase Lantus to 36U-      mealtime novolog, 2u tid  A.m. CBG on low side will reduce Lantus  5/26- BGs adequate- will con't meds, will likely increase after corticosteroid injection  10.  Hypertension.  Cozaar 50 mg daily.   Vitals:   04/30/20 2000 05/01/20 0518  BP: 110/60 115/61  Pulse: 67 (!) 55  Resp: 17 17  Temp: 97.7 F (36.5 C) 97.8 F (36.6 C)  SpO2: 96% 95%   5/27-  BP well controlled- con't meds  11.  Hyperlipidemia.  Lipitor 12.  Obesity.  BMI 29.50.  Dietary follow-up  13.  Post stroke shoulder pain - discussed with OT, add Kpad and aspercreme, tylenol for pain , no imaging needed at this time no trauma    xray R shoulder shows AC jt arthritis  corticosteroid injection R shoulder performed 5/26       14. Urine retention--volumes varoable  -urecholine trial 10mg  tid  Flomax 0.4mg    5/22- Bladder scans <150cc- voiding OK.     LOS: 15 days A FACE TO FACE EVALUATION WAS PERFORMED  Charlett Blake 05/01/2020, 9:24 AM

## 2020-05-01 NOTE — Progress Notes (Signed)
Occupational Therapy Session Note  Patient Details  Name: Brad Delacruz MRN: [id number] Date of Birth: 11/10/1961  Today's Date: 05/01/2020 OT Individual Time: 1330-1415 OT Individual Time Calculation (min): 45 min    Short Term Goals: Week 2:  OT Short Term Goal 1 (Week 2): Pt will complete UB dressing with Mod A OT Short Term Goal 2 (Week 2): Pt will complete toilet transfer with 1 assist without Stedy OT Short Term Goal 3 (Week 2): Pt will complete 1/3 components of donning pants with CGA  Skilled Therapeutic Interventions/Progress Updates:    Pt received in wc with interpretor and wife present.  Pt stated he needed to toilet.  Using grab bar, pt able to pull to stand with mod A , then needed max A to stabilize his balance as he complete stand pivot to toilet and while therapist adjusted clothing. Constant cues to lean towards the wall to avoid falling to his left.   Pt was able to sit with close S on toilet and he was able to void B and B.  Total A with cleansing.  Transferred back to w.c and then positioned at sink for standing balance with weight shifts side to side with max A.  T Pt continually closing L eye. Used tape on R side of glasses to encourage pt to keep L eye open.    Transferred w/c to bed squat pivot mod A and mod A to lay down. Pt grimaced and pointed to front of R shoulder. Demonstrated to spouse how to do a gentle external rotation stretch in supine.  Pt adjusted in bed with pillows and all needs met.   Bed alarm on and call light in reach. Spouse in room with patient.     Therapy Documentation Precautions:  Precautions Precautions: Fall Restrictions Weight Bearing Restrictions: No    Vital Signs: Therapy Vitals Temp: 97.7 F (36.5 C) Temp Source: Oral Pulse Rate: 66 Resp: 18 BP: (!) 110/58 Patient Position (if appropriate): Lying Oxygen Therapy SpO2: 96 % O2 Device: Room Air Pain: Pain Assessment Pain Scale: 0-10 Pain Score: 0-No pain Faces  Pain Scale: Hurts little more Pain Type: Acute pain Pain Location: Shoulder Pain Orientation: Right Pain Descriptors / Indicators: Aching Pain Onset: On-going Pain Intervention(s): Repositioned   Therapy/Group: Individual Therapy  [place] 05/01/2020, 3:16 PM

## 2020-05-01 NOTE — Progress Notes (Signed)
Patient ID: Brad Delacruz, male   DOB: January 17, 1961, 59 y.o.   MRN: [id number]  Sw followed up with family/patient for any questions/concerns we transition into weekend, no concerns

## 2020-05-01 NOTE — Care Management (Signed)
Patient ID: Brad Delacruz, male   DOB: October 08, 1961, 59 y.o.   MRN: [id number] Interpreters have been secured for the family education sessions during the week of 05/04/20 - 6-/4/21

## 2020-05-01 NOTE — Progress Notes (Signed)
Physical Therapy Session Note  Patient Details  Name: Brad Delacruz MRN: [id number] Date of Birth: Mar 05, 1961  Today's Date: 05/01/2020 PT Individual Time: 0909-1005 PT Individual Time Calculation (min): 56 min   Short Term Goals: Week 3:  PT Short Term Goal 1 (Week 3): Pt will propel WC 139ft with min assistusing hemi technique. PT Short Term Goal 2 (Week 3): Pt will transfer to and from [facility] with mod assist PT Short Term Goal 3 (Week 3): Pt will ambulate 38ft with max assist of 1 and UE on rail to force use of RLE PT Short Term Goal 4 (Week 3): pt will perform bed mobiltiy with mod assist consistently  Skilled Therapeutic Interventions/Progress Updates:    Pt received sitting in w/c with his wife and daughter present. In-person interpreter, Lissa Hoard, present throughout session. Pt's daughter told pt bye as she was leaving for the day and pt became very tearful, crying - family and therapist provided emotional support. Pt continues to remain nonverbal inconsistently replying with yes/no head nodes during session but appears agreeable to therapy. MD in/out for morning assessment.  Transported to/from gym in w/c for time management and energy conservation. Pt wearing R UE sling for comfort, removed during session. Continues to have R inattention with limited R visual scanning during session - also tends to keep eyes closed a lot, possible visual impairments (double vision, etc.)?  R squat pivot to EOM with max assist of 1 for lifting/pivoting hips, no pushing noted. Does have R lateral trunk LOB once on EOM requiring mod assist to recover - pt is aware of LOB and attempts to correct but is not sufficient without assist. Pt grimaces appearing to have R UE discomfort - therapist performed R scapula rectraction stretch x30 seconds. Performed repeated sitting<>mini-squat position with mod/max assist for lifting/lowering and balance due to R lean/push 2x8 reps - demonstrates R quad muscle activation.  Sitting>standing, no UE support, to grasp object located high on the L promoting increased B LE hip/knee extension and L lateral weight shift (to prevent pushing) with heavy mod assist for balance 2x10 reps. Sit>supine with +2 max assist for R hemibody management and trunk descent. In supine performed bridging 2x10 reps with cuing for increased R glute muscle activation - required assist to maintain R LE position. Pt unable to active R hip flexors to perform heel slides. Returned to sitting EOM with +2 max assist. Pt appeared to not feel well and with questioning determined he was nauseous - provided cool wash clothe and assessed vitals: BP 114/71 (MAP 85), HR 65bpm. After seated rest break pt nodded head "yes" that he was feeling better.  L squat pivot to w/c with mod assist for lifting/pivoting hips. Donned R UE sling for comfort. Transported back to room and left seated in w/c with needs in reach and seat belt alarm on.  Therapy Documentation Precautions:  Precautions Precautions: Fall Restrictions Weight Bearing Restrictions: No  Pain:   Grimacing noted during session appearing to have R UE pain - provided repositioning, stretching, and sling for comfort.   Therapy/Group: Individual Therapy  Ginny Forth, PT, DPT 05/01/2020, 7:55 AM

## 2020-05-02 ENCOUNTER — Inpatient Hospital Stay (HOSPITAL_COMMUNITY): Payer: BC Managed Care – PPO | Admitting: Occupational Therapy

## 2020-05-02 LAB — GLUCOSE, CAPILLARY
Glucose-Capillary: 119 mg/dL — ABNORMAL HIGH (ref 70–99)
Glucose-Capillary: 231 mg/dL — ABNORMAL HIGH (ref 70–99)
Glucose-Capillary: 139 mg/dL — ABNORMAL HIGH (ref 70–99)
Glucose-Capillary: 254 mg/dL — ABNORMAL HIGH (ref 70–99)

## 2020-05-02 NOTE — Progress Notes (Signed)
Occupational Therapy Session Note  Patient Details  Name: Brad Delacruz MRN: [id number] Date of Birth: 05/28/1961  Today's Date: 05/02/2020 OT Individual Time: 1015-1055 OT Individual Time Calculation (min): 40 min   Short Term Goals: Week 2:  OT Short Term Goal 1 (Week 2): Pt will complete UB dressing with Mod A OT Short Term Goal 2 (Week 2): Pt will complete toilet transfer with 1 assist without Stedy OT Short Term Goal 3 (Week 2): Pt will complete 1/3 components of donning pants with CGA  Skilled Therapeutic Interventions/Progress Updates:    Pt greeted in bed, lying directly on his Rt shoulder and grimacing. Discussed with family importance of positioning to minimize/prevent Rt sided pain. Advised them to notify staff right away if they see him lying on the Rt shoulder. Started session with supine<sit with pt able to complete with CGA and HOB elevated. Mod A for squat pivot<w/c going towards the Lt side. While sitting at the sink, pt donned pants with CGA for threading Lt LE into pants, tactile and visual cues for pt to problem solve when he initially threaded the Lt LE into the Rt pant leg. Pt able to don the Lt gripper sock with CGA and the Rt sock with assistance to bring the Rt leg closer to torso. Mod A for sit<stand while wearing his arm sling for pain mgt and comfort. Pt able to assist with elevating clothing on the Lt side with Mod balance assistance. Transitioned to oral care/grooming task completion while seated. Ideational apraxia noted when pt was handed a comb and began brushing his teeth with it. HOH to reestablish correct motor plan with this ADL item. Continued working on praxis during oral care and handwashing. Pt unable to tolerate having the Rt arm elevated on sink, grimacing when attempted. Therefore sanitizer was used to wash his affect limb today. OT then performed gentle ROM stretches for the Lt arm, pt with no grimacing and shook head when asked if this caused more pain.  Discussed with family gentle ROM stretches of elbow, wrist, hand, and digits that they could complete with him while lying down or sitting up, demonstrated these techniques and emphasized stopping ROM if there are s/s pain. Family appeared receptive. At end of session pt remained in the w/c with all needs within reach and safety belt fastened.   Therapy Documentation Precautions:  Precautions Precautions: Fall Restrictions Weight Bearing Restrictions: No Pain: Pts Rt arm was elevated in arm sling at end of session, RN notified to provide pain medicine at time of departure. Pain mgt addressed holistically via positioning strategies and gentle ROM as stated above   ADL:     Therapy/Group: Individual Therapy  [name] A [name] 05/02/2020, 4:03 PM

## 2020-05-02 NOTE — Plan of Care (Signed)
  Problem: RH BLADDER ELIMINATION Goal: RH STG MANAGE BLADDER WITH ASSISTANCE Description: STG Manage Bladder With min Assistance Outcome: Not Progressing; time toileting

## 2020-05-02 NOTE — Plan of Care (Signed)
  Problem: Consults Goal: RH STROKE PATIENT EDUCATION Description: See Patient Education module for education specifics  Outcome: Progressing Goal: Nutrition Consult-if indicated Outcome: Progressing Goal: Diabetes Guidelines if Diabetic/Glucose > 140 Description: If diabetic or lab glucose is > 140 mg/dl - Initiate Diabetes/Hyperglycemia Guidelines & Document Interventions  Outcome: Progressing   Problem: RH BLADDER ELIMINATION Goal: RH STG MANAGE BLADDER WITH ASSISTANCE Description: STG Manage Bladder With min Assistance Outcome: Progressing   Problem: RH SAFETY Goal: RH STG ADHERE TO SAFETY PRECAUTIONS W/ASSISTANCE/DEVICE Description: STG Adhere to Safety Precautions With supervision Assistance/Device. Outcome: Progressing   Problem: RH COGNITION-NURSING Goal: RH STG USES MEMORY AIDS/STRATEGIES W/ASSIST TO PROBLEM SOLVE Description: STG Uses Memory Aids/Strategies With min Assistance to Problem Solve. Outcome: Progressing   Problem: RH KNOWLEDGE DEFICIT Goal: RH STG INCREASE KNOWLEDGE OF DIABETES Description: Pt/family will demonstrate understanding of DM management with min assist using handout/booklets in spanish and assistance with interrupter prior to DC Outcome: Progressing Goal: RH STG INCREASE KNOWLEDGE OF HYPERTENSION Description: Pt/family will demonstrate understanding of HTN management with min assist using handout/booklets in spanish and assistance with interrupter prior to DC Outcome: Progressing Goal: RH STG INCREASE KNOWLEDGE OF DYSPHAGIA/FLUID INTAKE Description: Pt/family will demonstrate understanding of dysphagia management with min assist using handout/booklets in spanish and assistance with interrupter prior to DC Outcome: Progressing Goal: RH STG INCREASE KNOWLEGDE OF HYPERLIPIDEMIA Description: Pt/family will demonstrate understanding of HLD management with min assist using handout/booklets in spanish and assistance with interrupter prior to DC Outcome:  Progressing Goal: RH STG INCREASE KNOWLEDGE OF STROKE PROPHYLAXIS Description: Pt/family will demonstrate understanding of stroke prevention with min assist using handout/booklets in spanish and assistance with interrupter prior to DC Outcome: Progressing   

## 2020-05-03 LAB — GLUCOSE, CAPILLARY
Glucose-Capillary: 92 mg/dL (ref 70–99)
Glucose-Capillary: 152 mg/dL — ABNORMAL HIGH (ref 70–99)
Glucose-Capillary: 130 mg/dL — ABNORMAL HIGH (ref 70–99)
Glucose-Capillary: 208 mg/dL — ABNORMAL HIGH (ref 70–99)

## 2020-05-03 NOTE — Plan of Care (Signed)
  Problem: RH BLADDER ELIMINATION Goal: RH STG MANAGE BLADDER WITH ASSISTANCE Description: STG Manage Bladder With min Assistance Outcome: Progressing   Problem: RH SAFETY Goal: RH STG ADHERE TO SAFETY PRECAUTIONS W/ASSISTANCE/DEVICE Description: STG Adhere to Safety Precautions With supervision Assistance/Device. Outcome: Progressing   

## 2020-05-03 NOTE — Progress Notes (Signed)
[facility] PHYSICAL MEDICINE & REHABILITATION PROGRESS NOTE   Subjective/Complaints:  Remains globally aphasic , family at bedside   ROS: limited by language/aphasia  Objective:   No results found. No results for input(s): WBC, HGB, HCT, PLT in the last 72 hours. No results for input(s): NA, K, CL, CO2, GLUCOSE, BUN, CREATININE, CALCIUM in the last 72 hours.  Intake/Output Summary (Last 24 hours) at 05/03/2020 0914 Last data filed at 05/03/2020 0433 Gross per 24 hour  Intake -  Output 600 ml  Net -600 ml     Physical Exam: Vital Signs Blood pressure 119/65, pulse (!) 59, temperature 97.6 F (36.4 C), resp. rate 18, height 5\' 5"  (1.651 m), weight 75.6 kg, SpO2 100 %.  General: No acute distress Mood and affect are appropriate Heart: Regular rate and rhythm no rubs murmurs or extra sounds Lungs: Clear to auscultation, breathing unlabored, no rales or wheezes Abdomen: Positive bowel sounds, soft nontender to palpation, nondistended Extremities: No clubbing, cyanosis, or edema Skin: No evidence of breakdown, no evidence of rash   Neurologic:, motor strength is 5/5 in L and 0/5 RIght deltoid, bicep, tricep, grip, hip flexor, knee extensors, ankle dorsiflexor and plantar flexor Sensory exam cannot assess fully due to communication however the patient does wince when fingers and toes are pinched on the right side Musculoskeletal: NO right shoulder pain with PROM in ER/iR or with ABD Assessment/Plan: 1. Functional deficits secondary to Left MCA with R HP and Aphasia  which require 3+ hours per day of interdisciplinary therapy in a comprehensive inpatient rehab setting.  Physiatrist is providing close team supervision and 24 hour management of active medical problems listed below.  Physiatrist and rehab team continue to assess barriers to discharge/monitor patient progress toward functional and medical goals  Care Tool:  Bathing    Body parts bathed by patient: Chest,  Abdomen, Front perineal area, Right upper leg, Left upper leg, Face   Body parts bathed by helper: Right arm, Left arm, Buttocks, Right lower leg, Left lower leg     Bathing assist Assist Level: Maximal Assistance - Patient 24 - 49%     Upper Body Dressing/Undressing Upper body dressing   What is the patient wearing?: Pull over shirt    Upper body assist Assist Level: Moderate Assistance - Patient 50 - 74%    Lower Body Dressing/Undressing Lower body dressing      What is the patient wearing?: Pants     Lower body assist Assist for lower body dressing: Moderate Assistance - Patient 50 - 74%     Toileting Toileting    Toileting assist Assist for toileting: Maximal Assistance - Patient 25 - 49%     Transfers Chair/bed transfer  Transfers assist     Chair/bed transfer assist level: Moderate Assistance - Patient 50 - 74%     Locomotion Ambulation   Ambulation assist   Ambulation activity did not occur: Safety/medical concerns  Assist level: 2 helpers Assistive device: Other (comment)(rail) Max distance: 15   Walk 10 feet activity   Assist  Walk 10 feet activity did not occur: Safety/medical concerns  Assist level: 2 helpers Assistive device: Other (comment)   Walk 50 feet activity   Assist Walk 50 feet with 2 turns activity did not occur: Safety/medical concerns         Walk 150 feet activity   Assist Walk 150 feet activity did not occur: Safety/medical concerns         Walk 10 feet on uneven  surface  activity   Assist Walk 10 feet on uneven surfaces activity did not occur: Safety/medical concerns         Wheelchair     Assist        Wheelchair assist level: Minimal Assistance - Patient > 75% Max wheelchair distance: 120    Wheelchair 50 feet with 2 turns activity    Assist        Assist Level: Minimal Assistance - Patient > 75%   Wheelchair 150 feet activity     Assist      Assist Level: Moderate  Assistance - Patient 50 - 74%   Blood pressure 119/65, pulse (!) 59, temperature 97.6 F (36.4 C), resp. rate 18, height 5\' 5"  (1.651 m), weight 75.6 kg, SpO2 100 %.  Medical Problem List and Plan: 1.  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  TEE and loop recorder placement to be discussed as outpatient.             -patient may shower             -ELOS/Goals: 20-22 days MinA PT, OT, SLP Cont PT, OT, SLP  2.  Antithrombotics: -DVT/anticoagulation: Subcutaneous heparin.  Venous Doppler studies negative             -antiplatelet therapy: Brilinta 90 mg twice daily, aspirin 81 mg daily 3. Pain Management: Tylenol as needed. Well controlled.   Right shoulder pain, sling for comfort- improved after R subacromial injection   4. Mood: Amantadine 100 mg twice daily             -antipsychotic agents: N/A 5. Neuropsych: This patient is not capable of making decisions on his own behalf. 6. Skin/Wound Care: Routine skin checks 7. Fluids/Electrolytes/Nutrition: Routine in and outs with follow-up chemistries 8.  Dysphagia.  Dysphagia #1 honey thick liquids.   Dietary follow-up as well as speech therapy 9.  Diabetes mellitus.  Hemoglobin A1c 9.2.  NovoLog 5 units 2 times daily, Lantus insulin 35 units twice daily. Uncontrolled. CBG (last 3)  Recent Labs    05/02/20 1715 05/02/20 2007 05/03/20 0607  GLUCAP 139* 254* 92  CBGs elevated due to steroid, should subside over the next day   5/15:  increase Lantus to 36U-      mealtime novolog, 2u tid  A.m. CBG on low side will reduce Lantus  5/26- BGs adequate- will con't meds, will likely increase after corticosteroid injection  10.  Hypertension.  Cozaar 50 mg daily.   Vitals:   05/02/20 1943 05/03/20 0419  BP: (!) 104/56 119/65  Pulse: 65 (!) 59  Resp: 18 18  Temp: 98 F (36.7 C) 97.6 F (36.4 C)  SpO2: 98% 100%   5/30- BP well  controlled- con't meds  11.  Hyperlipidemia.  Lipitor 12.  Obesity.  BMI 29.50.  Dietary follow-up  13.  Post stroke shoulder pain - discussed with OT, add Kpad and aspercreme, tylenol for pain , no imaging needed at this time no trauma    xray R shoulder shows AC jt arthritis  corticosteroid injection R shoulder performed 5/26       14. Urine retention--volumes variable  -urecholine trial 10mg  tid  Flomax 0.4mg    5/22- Bladder scans <150cc- voiding OK.     LOS: 17 days A FACE TO FACE EVALUATION WAS PERFORMED  05/03/2020, 9:14 AM

## 2020-05-04 ENCOUNTER — Ambulatory Visit (HOSPITAL_COMMUNITY): Payer: BC Managed Care – PPO | Admitting: Physical Therapy

## 2020-05-04 ENCOUNTER — Inpatient Hospital Stay (HOSPITAL_COMMUNITY): Payer: BC Managed Care – PPO | Admitting: Occupational Therapy

## 2020-05-04 ENCOUNTER — Encounter (HOSPITAL_COMMUNITY): Payer: BC Managed Care – PPO

## 2020-05-04 DIAGNOSIS — M7541 Impingement syndrome of right shoulder: Secondary | ICD-10-CM

## 2020-05-04 DIAGNOSIS — E119 Type 2 diabetes mellitus without complications: Secondary | ICD-10-CM

## 2020-05-04 LAB — GLUCOSE, CAPILLARY
Glucose-Capillary: 77 mg/dL (ref 70–99)
Glucose-Capillary: 147 mg/dL — ABNORMAL HIGH (ref 70–99)
Glucose-Capillary: 221 mg/dL — ABNORMAL HIGH (ref 70–99)
Glucose-Capillary: 205 mg/dL — ABNORMAL HIGH (ref 70–99)

## 2020-05-04 NOTE — Progress Notes (Signed)
Occupational Therapy Session Note  Patient Details  Name: Jentzen Minasyan MRN: [id number] Date of Birth: 1961-05-29  Today's Date: 05/04/2020 OT Individual Time: 2458-0998 OT Individual Time Calculation (min): 58 min   Skilled Therapeutic Interventions/Progress Updates:    Pt greeted in bed, finishing up breakfast with spouse. Pt with no oral residue when he finished his meal. Supine<sit completed with close supervision and HOB elevated. Mod A for squat pivot<w/c<standard toilet, using grab bar in bathroom. Pt with continent bladder void. Max A for squat/stand pivot back to w/c going towards his affected side. The same directional transfer assistance required for TTB. Worked on motor planning, Rt attention, and sustained attention to task during bathing tasks. He tolerated HOH to wash Lt side using the Rt hand without s/s pain. Sit<stand with Mod A to support Lt side while pt completed hygiene in the back. He required Max A to wash feet. During shower, dtr Verlee Monte and son Freada Bergeron arrived for family education. Discussed importance of timed toileting, facilitating HOH for Rt arm when there are no s/s pain for contracture prevention, gentle ROM, and Rt attention. Dora stabilized w/c while pt completed transfer out of shower. Discussed caregiver body mechanics when assisting pt with sit<stands during dressing tasks at the sink, emphasizing methods to protect Rt side. Pt able to assist with elevating pants on the Lt side today and also thread his Lt LE into pants with CGA. Educated family on hemi dressing techniques and having pt help out whenever dressing the Rt side. He was able to button up his shirt. When buttons were not lined up correctly, he recognized this with min tactile cues, unbuttoned shirt and then buttoned shirt correctly with steady assist. Note that he tried to put the deoderant stick in his mouth. Educated family on ideational apraxia and safety when handing him ADL items. Also discussed using Rt  hand as gross stabilizer/assist whenever pt does not exhibit s/s pain. Family appeared receptive to all education, Dora video recording OT when donning arm sling and writing down education/instruction throughout session. She also reported their bathroom is w/c accessible and that a family member is installing a grab bar near the toilet. They have just installed a walk-in shower. Discussed safety concerns and pts probable need to sponge bathe at time of d/c. Advised them to show therapy a picture of the shower setup for problem solving. At end of session pt remained in w/c with all needs within reach, safety belt fastened, and Rt arm supported and elevated in arm sling. Tx focus placed on NMR, functional transfers, ADL retraining, positioning strategies for pain mgt, and family education.   Therapy Documentation Precautions:  Precautions Precautions: Fall Restrictions Weight Bearing Restrictions: No Pain: RN made aware of pts need for a new pain patch at end of session  Pain Assessment Pain Score: 0-No pain ADL:       Therapy/Group: Individual Therapy  Michaela A Hoffman 05/04/2020, 12:27 PM

## 2020-05-04 NOTE — Progress Notes (Signed)
Speech Language Pathology Daily Session Note  Patient Details  Name: Brad Delacruz MRN: [id number] Date of Birth: 12/25/1960  Today's Date: 05/04/2020 SLP Individual Time: 0902-1000 SLP Individual Time Calculation (min): 58 min  Short Term Goals: Week 3: SLP Short Term Goal 1 (Week 3): Patient will demonstrate sustained attention to functional tasks for 15 minutes with Min verbal cues for redirection. SLP Short Term Goal 2 (Week 3): Patient will complete basic, familiar tasks with mod assist for functional problem solving. SLP Short Term Goal 3 (Week 3): Patient will vocalize on command in 50% of opportunites with Max A multimodal cues. SLP Short Term Goal 4 (Week 3): Patient will utilize gestures to answer basic yes/no questions in 75% of opportunities with Min A verbal and visual cues. SLP Short Term Goal 5 (Week 3): Patient will consume current diet with minimal overt s/s of aspiration Mod I for use of swallowing compensatory strategies.  Skilled Therapeutic Interventions: Skilled ST services focused on education, swallow and language skills. Pt's family was present for education. Pt's daughter, Verlee Monte, provided verbal cuing during language tasks, following education of how to cue and levels of cuing.  SLP facilitated comprehension of language in identify common objects from a field of two, pt demonstrated accuracy in 4 out 7 trials with max A verbal/visual cues. Pt was able to vocalize word approximations in 4 out 10 opportunities with max A verbal/visuaal cues. Pt appeared to verbally preservative on "no" throughout session. SLP provided education to family about continued reduced comprehension of language and the importance to provided visual cues and yes/no questions to clarify comprehension. SLP also facilitated basic problem solving skills in novel PEG design and LEGO blocks creating simple alternating patterns, pt required max A faded to mod A verbal cues to recreate patterns given two  choices of colors. Pt consumed regular textured trial with supervision A verbal for use of lingual sweeps in right oral cavity. SLP recommends trial tray of regular in upcoming treatment session. Pt was left in room with family, call bell within reach and chair alarm set. ST recommends to continue skilled ST services.      Pain Pain Assessment Pain Scale: 0-10 Pain Score: 0-No pain  Therapy/Group: Individual Therapy  Lesslie Mckeehan  [facility] 05/04/2020, 10:13 AM

## 2020-05-04 NOTE — Plan of Care (Signed)
  Problem: RH SAFETY Goal: RH STG ADHERE TO SAFETY PRECAUTIONS W/ASSISTANCE/DEVICE Description: STG Adhere to Safety Precautions With supervision Assistance/Device. Outcome: Progressing

## 2020-05-04 NOTE — Progress Notes (Signed)
[place] PHYSICAL MEDICINE & REHABILITATION PROGRESS NOTE   Subjective/Complaints:  Up in chair. Family with him. Still having pain in right shoulder. Family asked what they could do for it  ROS: limited due to language/communication    Objective:   No results found. No results for input(s): WBC, HGB, HCT, PLT in the last 72 hours. No results for input(s): NA, K, CL, CO2, GLUCOSE, BUN, CREATININE, CALCIUM in the last 72 hours.  Intake/Output Summary (Last 24 hours) at 05/04/2020 1039 Last data filed at 05/04/2020 0700 Gross per 24 hour  Intake 800 ml  Output 825 ml  Net -25 ml     Physical Exam: Vital Signs Blood pressure 111/63, pulse 65, temperature 98.7 F (37.1 C), temperature source Oral, resp. rate 18, height 5\' 5"  (1.651 m), weight 75.6 kg, SpO2 96 %.  Constitutional: No distress . Vital signs reviewed. HEENT: EOMI, oral membranes moist Neck: supple Cardiovascular: RRR without murmur. No JVD    Respiratory/Chest: CTA Bilaterally without wheezes or rales. Normal effort    GI/Abdomen: BS +, non-tender, non-distended Ext: no clubbing, cyanosis, or edema Psych: pleasant, smiles.  Neurologic:, motor strength is 5/5 in L and 0/5 RIght deltoid, bicep, tricep, grip, hip flexor, knee extensors, ankle dorsiflexor and plantar flexor Sensory exam cannot assess fully due to communication however the patient does wince when fingers and toes are pinched on the right side Musculoskeletal: mild to moderate right shoulder pain with PROM in ER/iR or with ABD, wearing sling. 1/4" sublux  Assessment/Plan: 1. Functional deficits secondary to Left MCA with R HP and Aphasia  which require 3+ hours per day of interdisciplinary therapy in a comprehensive inpatient rehab setting.  Physiatrist is providing close team supervision and 24 hour management of active medical problems listed below.  Physiatrist and rehab team continue to assess barriers to discharge/monitor patient progress toward  functional and medical goals  Care Tool:  Bathing    Body parts bathed by patient: Chest, Abdomen, Front perineal area, Right upper leg, Left upper leg, Face, Buttocks   Body parts bathed by helper: Right arm, Left arm, Buttocks, Right lower leg, Left lower leg Body parts n/a: Right arm, Left arm, Right lower leg, Left lower leg   Bathing assist Assist Level: Maximal Assistance - Patient 24 - 49%     Upper Body Dressing/Undressing Upper body dressing   What is the patient wearing?: Button up shirt    Upper body assist Assist Level: Moderate Assistance - Patient 50 - 74%    Lower Body Dressing/Undressing Lower body dressing      What is the patient wearing?: Pants, Incontinence brief     Lower body assist Assist for lower body dressing: Maximal Assistance - Patient 25 - 49%     Toileting Toileting    Toileting assist Assist for toileting: Maximal Assistance - Patient 25 - 49%     Transfers Chair/bed transfer  Transfers assist     Chair/bed transfer assist level: Moderate Assistance - Patient 50 - 74%     Locomotion Ambulation   Ambulation assist   Ambulation activity did not occur: Safety/medical concerns  Assist level: 2 helpers Assistive device: Other (comment)(rail) Max distance: 15   Walk 10 feet activity   Assist  Walk 10 feet activity did not occur: Safety/medical concerns  Assist level: 2 helpers Assistive device: Other (comment)   Walk 50 feet activity   Assist Walk 50 feet with 2 turns activity did not occur: Safety/medical concerns  Walk 150 feet activity   Assist Walk 150 feet activity did not occur: Safety/medical concerns         Walk 10 feet on uneven surface  activity   Assist Walk 10 feet on uneven surfaces activity did not occur: Safety/medical concerns         Wheelchair     Assist        Wheelchair assist level: Minimal Assistance - Patient > 75% Max wheelchair distance: 120     Wheelchair 50 feet with 2 turns activity    Assist        Assist Level: Minimal Assistance - Patient > 75%   Wheelchair 150 feet activity     Assist      Assist Level: Moderate Assistance - Patient 50 - 74%   Blood pressure 111/63, pulse 65, temperature 98.7 F (37.1 C), temperature source Oral, resp. rate 18, height 5\' 5"  (1.651 m), weight 75.6 kg, SpO2 96 %.  Medical Problem List and Plan: 1.  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  TEE and loop recorder placement to be discussed as outpatient.             -patient may shower             -ELOS/Goals: 20-22 days MinA PT, OT, SLP Cont PT, OT, SLP   2.  Antithrombotics: -DVT/anticoagulation: Subcutaneous heparin.  Venous Doppler studies negative             -antiplatelet therapy: Brilinta 90 mg twice daily, aspirin 81 mg daily 3. Pain Management: Tylenol as needed. Well controlled.    Right shoulder pain, sling for comfort- improved after R subacromial injection   5/31: -spoke with pt/family re: RUE, treatment considerations. Continue ROM as tolerated, support while seated/sleeping/standing with sling/pillows, lap tray. Lidocaine patch helps.  4. Mood: Amantadine 100 mg twice daily             -antipsychotic agents: N/A 5. Neuropsych: This patient is not capable of making decisions on his own behalf. 6. Skin/Wound Care: Routine skin checks 7. Fluids/Electrolytes/Nutrition: Routine in and outs with follow-up chemistries 8.  Dysphagia.  Dysphagia #1 honey thick liquids.   Dietary follow-up as well as speech therapy 9.  Diabetes mellitus.  Hemoglobin A1c 9.2.  NovoLog 5 units 2 times daily, Lantus insulin 35 units twice daily. Uncontrolled. CBG (last 3)  Recent Labs    05/03/20 1647 05/03/20 2017 05/04/20 0607  GLUCAP 130* 208* 77  CBGs elevated due to steroid, should subside over the next day   5/15:   increase Lantus to 36U-      mealtime novolog, 2u tid  A.m. CBG on low side will reduce Lantus  5/31 CBG's falling after recent steroid injection. Monitor and cover with SSI if needed for spikes 10.  Hypertension.  Cozaar 50 mg daily.   Vitals:   05/03/20 1946 05/04/20 0423  BP: 113/68 111/63  Pulse: 65 65  Resp: 18 18  Temp: (!) 97.4 F (36.3 C) 98.7 F (37.1 C)  SpO2: 100% 96%   5/31- BP well controlled- con't meds  11.  Hyperlipidemia.  Lipitor 12.  Obesity.  BMI 29.50.  Dietary follow-up  13.  Post stroke shoulder pain - discussed with OT, add Kpad and aspercreme, tylenol for pain , no imaging needed at this time no trauma    xray R shoulder shows AC jt  arthritis  corticosteroid injection R shoulder performed 5/26      14. Urine retention--volumes variable  -urecholine trial 10mg  tid  Flomax 0.4mg    5/31 volumes OK.     LOS: 18 days A FACE TO FACE EVALUATION WAS PERFORMED  6/31 05/04/2020, 10:39 AM

## 2020-05-04 NOTE — Progress Notes (Signed)
Physical Therapy Session Note  Patient Details  Name: Brad Delacruz MRN: [id number] Date of Birth: [birth year]  Today's Date: 05/04/2020 PT Individual Time: 1300-1415 PT Individual Time Calculation (min): 75 min   Short Term Goals: Week 3:  PT Short Term Goal 1 (Week 3): Pt will propel WC 157f with min assistusing hemi technique. PT Short Term Goal 2 (Week 3): [facility] PT Short Term Goal 3 (Week 3): Pt will ambulate 271fwith max assist of 1 and UE on rail to force use of RLE PT Short Term Goal 4 (Week 3): pt will perform bed mobiltiy with mod assist consistently  Skilled Therapeutic Interventions/Progress Updates:   Pt received supine in bed and agreeable to PT. Supine>sit transfer with mod assist and for reciprocal scooting to EOB. Family present and requesting pt go to bathroom. Stedy transfer to toilet with min assist for safety. Pt able to void bladder. Pt assisted to pull pants to waist. In stedy with mod assist to keep balance. Family present to initiate family training. Pt transported to rehab gym. PT instructed pt in squat pivot R and L with mod assist to the R and max assist to the L due to poor motor planning. SB transfers also performed with mod assist overall from PT R and L. Pt's family then performed squat pivot with max assist x 2 bil with assist from son and then daughter; moderate cues for set up to block RLE as extensor response noted in transfers. SB transfer completed by daughter with mod-max assist and cues for safety and set up. PT educated pt's family on safe car transfer and recommended sedan height to alllow use of SB and reduce fall risk. Pt performed car transfer with  Mod assist overall and max cues for sequencing and motor planning. Gait training at rail in hall x 2566fith max A and +2 for WC follow; noted extension in the RLE 75% of the time in stance, but still required blocking and total A for advancement. WC mobility x  150f45fth min assist using LLE and LUE. [facility] returned to [facility] bell in reach and all needs met.        Therapy Documentation Precautions:  Precautions Precautions: Fall Restrictions Weight Bearing Restrictions: No    Vital Signs: Therapy Vitals Temp: 100 F (37.8 C) Temp Source: Oral Pulse Rate: 75 Resp: 16 BP: 109/72 Patient Position (if appropriate): Lying Oxygen Therapy SpO2: 98 % O2 Device: Room Air Pain: Faces: hurts a little: repositioned  Therapy/Group: Individual Therapy  AustLorie Phenix1/2021, 3:37 PM

## 2020-05-05 ENCOUNTER — Inpatient Hospital Stay (HOSPITAL_COMMUNITY): Payer: BC Managed Care – PPO

## 2020-05-05 ENCOUNTER — Inpatient Hospital Stay (HOSPITAL_COMMUNITY): Payer: BC Managed Care – PPO | Admitting: Occupational Therapy

## 2020-05-05 ENCOUNTER — Inpatient Hospital Stay (HOSPITAL_COMMUNITY): Payer: BC Managed Care – PPO | Admitting: *Deleted

## 2020-05-05 LAB — GLUCOSE, CAPILLARY
Glucose-Capillary: 145 mg/dL — ABNORMAL HIGH (ref 70–99)
Glucose-Capillary: 135 mg/dL — ABNORMAL HIGH (ref 70–99)
Glucose-Capillary: 177 mg/dL — ABNORMAL HIGH (ref 70–99)

## 2020-05-05 NOTE — Progress Notes (Signed)
Noticed R eye was a little red and swollen, assessed and possibly a stye? Family wanted me to make note of it.

## 2020-05-05 NOTE — Progress Notes (Signed)
Speech Language Pathology Daily Session Note  Patient Details  Name: Brad Delacruz MRN: [id number] Date of Birth: 24-Jul-1961  Today's Date: 05/05/2020 SLP Individual Time: 1301-1330 SLP Individual Time Calculation (min): 29 min  Short Term Goals: Week 3: SLP Short Term Goal 1 (Week 3): Patient will demonstrate sustained attention to functional tasks for 15 minutes with Min verbal cues for redirection. SLP Short Term Goal 2 (Week 3): Patient will complete basic, familiar tasks with mod assist for functional problem solving. SLP Short Term Goal 3 (Week 3): Patient will vocalize on command in 50% of opportunites with Max A multimodal cues. SLP Short Term Goal 4 (Week 3): Patient will utilize gestures to answer basic yes/no questions in 75% of opportunities with Min A verbal and visual cues. SLP Short Term Goal 5 (Week 3): Patient will consume current diet with minimal overt s/s of aspiration Mod I for use of swallowing compensatory strategies.  Skilled Therapeutic Interventions: Skilled ST services focused on swallow skills. SLP facilitated PO consumption of regular textured foods and thin liquids via cup, pt required mod A fade to min A verbal cues for bolus size and little to mild right oral cavity residue. Pt continues to demonstrate prolonged mastication, family supports baseline, however Is able to clear oral cavity with regular textured foods and no overt s/s aspiration. SLP upgraded pt to regular textures with full supervision A. Pt's wife is able to provided full supervision with upgraded texture. All questions were answered to satisfaction. Pt was left in room with wife, call bell within reach and bed alarm set. ST recommends to continue skilled ST services.      Pain Pain Assessment Pain Score: 0-No pain  Therapy/Group: Individual Therapy  Aine Strycharz  [facility] 05/05/2020, 3:47 PM

## 2020-05-05 NOTE — Progress Notes (Signed)
[place] PHYSICAL MEDICINE & REHABILITATION PROGRESS NOTE   Subjective/Complaints:  No issues overnite   ROS: limited due to language/communication    Objective:   No results found. No results for input(s): WBC, HGB, HCT, PLT in the last 72 hours. No results for input(s): NA, K, CL, CO2, GLUCOSE, BUN, CREATININE, CALCIUM in the last 72 hours.  Intake/Output Summary (Last 24 hours) at 05/05/2020 0827 Last data filed at 05/05/2020 0507 Gross per 24 hour  Intake 660 ml  Output 550 ml  Net 110 ml     Physical Exam: Vital Signs Blood pressure 107/64, pulse 68, temperature 97.9 F (36.6 C), temperature source Oral, resp. rate 18, height 5\' 5"  (1.651 m), weight 75.6 kg, SpO2 96 %.   General: No acute distress Mood and affect are appropriate Heart: Regular rate and rhythm no rubs murmurs or extra sounds Lungs: Clear to auscultation, breathing unlabored, no rales or wheezes Abdomen: Positive bowel sounds, soft nontender to palpation, nondistended Extremities: No clubbing, cyanosis, or edema Skin: No evidence of breakdown, no evidence of rash  0/5 RUE and RLE  Musculoskeletal: mild to moderate right shoulder pain with PROM in ER/iR or with ABD, wearing sling. 1/4" sublux  Assessment/Plan: 1. Functional deficits secondary to Left MCA with R HP and Aphasia  which require 3+ hours per day of interdisciplinary therapy in a comprehensive inpatient rehab setting.  Physiatrist is providing close team supervision and 24 hour management of active medical problems listed below.  Physiatrist and rehab team continue to assess barriers to discharge/monitor patient progress toward functional and medical goals  Care Tool:  Bathing    Body parts bathed by patient: Chest, Abdomen, Front perineal area, Right upper leg, Left upper leg, Face, Buttocks   Body parts bathed by helper: Right arm, Left arm, Buttocks, Right lower leg, Left lower leg Body parts n/a: Right arm, Left arm, Right lower  leg, Left lower leg   Bathing assist Assist Level: Maximal Assistance - Patient 24 - 49%     Upper Body Dressing/Undressing Upper body dressing   What is the patient wearing?: Button up shirt    Upper body assist Assist Level: Moderate Assistance - Patient 50 - 74%    Lower Body Dressing/Undressing Lower body dressing      What is the patient wearing?: Pants, Incontinence brief     Lower body assist Assist for lower body dressing: Maximal Assistance - Patient 25 - 49%     Toileting Toileting    Toileting assist Assist for toileting: Maximal Assistance - Patient 25 - 49%     Transfers Chair/bed transfer  Transfers assist     Chair/bed transfer assist level: Moderate Assistance - Patient 50 - 74%     Locomotion Ambulation   Ambulation assist   Ambulation activity did not occur: Safety/medical concerns  Assist level: 2 helpers Assistive device: Other (comment)(rail) Max distance: 15   Walk 10 feet activity   Assist  Walk 10 feet activity did not occur: Safety/medical concerns  Assist level: 2 helpers Assistive device: Other (comment)   Walk 50 feet activity   Assist Walk 50 feet with 2 turns activity did not occur: Safety/medical concerns         Walk 150 feet activity   Assist Walk 150 feet activity did not occur: Safety/medical concerns         Walk 10 feet on uneven surface  activity   Assist Walk 10 feet on uneven surfaces activity did not occur: Safety/medical concerns  Wheelchair     Assist        Wheelchair assist level: Minimal Assistance - Patient > 75% Max wheelchair distance: 120    Wheelchair 50 feet with 2 turns activity    Assist        Assist Level: Minimal Assistance - Patient > 75%   Wheelchair 150 feet activity     Assist      Assist Level: Moderate Assistance - Patient 50 - 74%   Blood pressure 107/64, pulse 68, temperature 97.9 F (36.6 C), temperature source Oral, resp.  rate 18, height 5\' 5"  (1.651 m), weight 75.6 kg, SpO2 96 %.  Medical Problem List and Plan: 1.  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  TEE and loop recorder placement to be discussed as outpatient.             -patient may shower             -ELOS/Goals: 20-22 days MinA PT, OT, SLP Cont PT, OT, SLP   2.  Antithrombotics: -DVT/anticoagulation: Subcutaneous heparin.  Venous Doppler studies negative             -antiplatelet therapy: Brilinta 90 mg twice daily, aspirin 81 mg daily 3. Pain Management: Tylenol as needed. Well controlled.    Right shoulder pain, sling for comfort- improved after R subacromial injection   5/31: -spoke with pt/family re: RUE, treatment considerations. Continue ROM as tolerated, support while seated/sleeping/standing with sling/pillows, lap tray. Lidocaine patch helps. Discussed wit hPT, now that pt is in regular WC can use hemi tray or arm trough  4. Mood: Amantadine 100 mg twice daily             -antipsychotic agents: N/A 5. Neuropsych: This patient is not capable of making decisions on his own behalf. 6. Skin/Wound Care: Routine skin checks 7. Fluids/Electrolytes/Nutrition: Routine in and outs with follow-up chemistries 8.  Dysphagia.  Dysphagia #1 honey thick liquids.   Dietary follow-up as well as speech therapy 9.  Diabetes mellitus.  Hemoglobin A1c 9.2.  NovoLog 5 units 2 times daily, Lantus insulin 35 units twice daily. Uncontrolled. CBG (last 3)  Recent Labs    05/04/20 1721 05/04/20 2139 05/05/20 0615  GLUCAP 221* 205* 145*  CBGs elevated due to steroid,improving  lantus 34U BID 10.  Hypertension.  Cozaar 50 mg daily.   Vitals:   05/04/20 2136 05/05/20 0505  BP: (!) 109/58 107/64  Pulse: 68 68  Resp: 18 18  Temp: 97.6 F (36.4 C) 97.9 F (36.6 C)  SpO2: 97% 96%   6/1 controlled   11.  Hyperlipidemia.  Lipitor 12.  Obesity.   BMI 29.50.  Dietary follow-up  13.  Post stroke shoulder pain - discussed with OT, add Kpad and aspercreme, tylenol for pain , no imaging needed at this time no trauma    xray R shoulder shows AC jt arthritis  corticosteroid injection R shoulder performed 5/26      14. Urine retention--volumes variable  -urecholine trial 10mg  tid  Flomax 0.4mg    5/31 volumes OK.     LOS: 19 days A FACE TO FACE EVALUATION WAS PERFORMED  Charlett Blake 05/05/2020, 8:27 AM

## 2020-05-05 NOTE — Plan of Care (Signed)
Goals downgraded

## 2020-05-05 NOTE — Progress Notes (Signed)
Occupational Therapy Weekly Progress Note  Patient Details  Name: Brad Delacruz MRN: [id number] Date of Birth: 10-17-1961  Beginning of progress report period: Apr 26, 2020 End of progress report period: May 05, 2020    Patient has met 1 of 3 short term goals. Pt slow progress towards goals this week. Hands on family education has started in preparation for upcoming discharge. Pt currently required mod A squat pivot transfer to the L and max A squat pivot transfer to the R. Pt needing max A for UB self care and total A for LB self care. +2 assistance needed for toileting with management of clothing and hygiene safely. Pt continues to be 0/5 throughout R UE and pt reports pain with movement and R shoulder sling utilized during transfers. Visual assessment still difficult secondary to aphasia at this time. Max cuing to locate items on R side.   Patient continues to demonstrate the following deficits: muscle weakness, decreased cardiorespiratoy endurance, abnormal tone, decreased coordination and decreased motor planning, field cut, decreased attention to right and right side neglect, decreased initiation, decreased attention, decreased awareness, decreased problem solving, decreased safety awareness and decreased memory and decreased sitting balance, decreased standing balance, decreased postural control, hemiplegia and decreased balance strategies and therefore will continue to benefit from skilled OT intervention to enhance overall performance with BADL and Reduce care partner burden.  Patient not progressing toward long term goals.  See goal revision..  Continue plan of care. goals downgraded to mod A overall with max A for toileting and LB self care.   OT Short Term Goals Week 2:  OT Short Term Goal 1 (Week 2): Pt will complete UB dressing with Mod A OT Short Term Goal 1 - Progress (Week 2): Not met OT Short Term Goal 2 (Week 2): Pt will complete toilet transfer with 1 assist without  Stedy OT Short Term Goal 2 - Progress (Week 2): Met OT Short Term Goal 3 (Week 2): Pt will complete 1/3 components of donning pants with CGA OT Short Term Goal 3 - Progress (Week 2): Not met Week 3:  OT Short Term Goal 1 (Week 3): STGs=LTGs secondary to upcoming discharge      Therapy Documentation Precautions:  Precautions Precautions: Fall Restrictions Weight Bearing Restrictions: No General:   Vital Signs: Therapy Vitals Temp: 98.5 F (36.9 C) Pulse Rate: 65 Resp: 19 BP: 113/64 Patient Position (if appropriate): Lying Oxygen Therapy SpO2: 97 % O2 Device: Room Air Pain: Pain Assessment Pain Score: 0-No pain    Therapy/Group: Individual Therapy  Gypsy Decant 05/05/2020, 4:39 PM

## 2020-05-05 NOTE — Progress Notes (Signed)
Physical Therapy Session Note  Patient Details  Name: Brad Delacruz MRN: [id number] Date of Birth: 1960-12-11  Today's Date: 05/05/2020 PT Individual Time: 0905-1000 PT Individual Time Calculation (min): 55 min   Short Term Goals: Week 3:  PT Short Term Goal 1 (Week 3): Pt will propel WC 13f with min assistusing hemi technique. PT Short Term Goal 2 (Week 3): Pt will [facility] PT Short Term Goal 3 (Week 3): Pt will ambulate 262fwith max assist of 1 and UE on rail to force use of RLE PT Short Term Goal 4 (Week 3): pt will perform bed mobiltiy with mod assist consistently  Skilled Therapeutic Interventions/Progress Updates:  Pt presented in w/c with MD and family present agreeable to therapy. Session focused on hands on family edu with emphasis on blocked practice squat pivot transfers mat to/from w/c. Pt's dgt Brad Sierrasnd wife present for session. Provided edu to Brad Sierrasn pt's current status, transition during final week of therapy in family edu and safest manner for transfers with balance and gait incorporated in.  Pt participated in w/c mobility x 5064fith min/modA via hemi-technique. Pt required verbal cues for sequencing and using L foot. Pt transported remaining distance to rehab gym. Demonstrated squat pivot transfer to L and R with PTA demonstrating best body mechanics and need for providing verbal cues for safety. Pt's wife performed x1 squat pivot to R with wife providing poor verbal cues and pt wrapping arm around pt's neck thus requiring wife to perform maxA squat pivot transfer. Re-emphasized importance for cues and to allow pt to perform what he can. Wife performed additional x 3 transfers to R only progressing to min/modA. PTA then had dgt, Brad Sierrasrform same transfers with improved verbal cues and also performed with min/modA. PTA then obtained half lap tray and set up on w/c to allow rest from splint. Pt nodded head yes when asked if comfortable. PTA also  placed [place] on tray to minimize slipping of RUE. Pt then performed additional [id number] propulsion and transported back to room. Pt remained in w/c and advised family to call for nsg for timed toileting in approx 1 hour. Pt left with belt alarm in placed, call bell within reach and current needs met.      Therapy Documentation Precautions:  Precautions Precautions: Fall Restrictions Weight Bearing Restrictions: No General:   Vital Signs:   Pain: Pain Assessment Pain Score: 0-No pain Mobility:   Locomotion :    Trunk/Postural Assessment :    Balance:   Exercises:   Other Treatments:      Therapy/Group: Individual Therapy  Wilmar Prabhakar 05/05/2020, 3:57 PM

## 2020-05-05 NOTE — Progress Notes (Signed)
Occupational Therapy Session Note  Patient Details  Name: Brad Delacruz MRN: [id number] Date of Birth: 1961-12-04  Today's Date: 05/05/2020 OT Individual Time: 0805-0900 OT Individual Time Calculation (min): 55 min    Short Term Goals: Week 2:  OT Short Term Goal 1 (Week 2): Pt will complete UB dressing with Mod A OT Short Term Goal 2 (Week 2): Pt will complete toilet transfer with 1 assist without Stedy OT Short Term Goal 3 (Week 2): Pt will complete 1/3 components of donning pants with CGA  Skilled Therapeutic Interventions/Progress Updates:    Pt greeted at time of session resting in bed with [facility] elevated with wife and interpreter in the room. Wife agreeable to focus session on caregiver education and hands on training to work on toilet transfers. Bed mobility with Min A for the pt to come to EOB in sitting, squat pivot bed > manual wheelchair with Mod A towards his L side (unaffected side). Caregiver ed for wife to don RUE sling to support humeral joint, good carryover noted and demonstration. Pt set up at standard toilet and assisted in transfer by using LUE to pull up from grab bar, static standing with support of therapist for wife to doff clothing, wife performed hygiene. Wife performed squat pivot back to wheelchair with instructions/training to not let the pt hold the bar with LUE, brace his RLE, and support hips in the transfer. Good carryover and follow through noted. Family ed and training regarding use of drop arm commode at home to facilitate ease and decrease caregiver burden, therapist located and exchanged [facility] in prep for future training. Patient up in chair with alarm on, all needs met and family in the room.  Therapy Documentation Precautions:  Precautions Precautions: Fall Restrictions Weight Bearing Restrictions: No   Therapy/Group: Individual Therapy  Viona Gilmore 05/05/2020, 12:58 PM

## 2020-05-05 NOTE — Plan of Care (Signed)
  Problem: RH BLADDER ELIMINATION Goal: RH STG MANAGE BLADDER WITH ASSISTANCE Description: STG Manage Bladder With min Assistance Outcome: Progressing   Problem: RH SAFETY Goal: RH STG ADHERE TO SAFETY PRECAUTIONS W/ASSISTANCE/DEVICE Description: STG Adhere to Safety Precautions With supervision Assistance/Device. Outcome: Progressing

## 2020-05-05 NOTE — Progress Notes (Signed)
Occupational Therapy Session Note  Patient Details  Name: Tayson Schnelle MRN: [id number] Date of Birth: [birth year]  Today's Date: 05/05/2020 OT Individual Time: 1330-1415 OT Individual Time Calculation (min): 45 min    Short Term Goals: Week 2:  OT Short Term Goal 1 (Week 2): Pt will complete UB dressing with Mod A OT Short Term Goal 2 (Week 2): Pt will complete toilet transfer with 1 assist without Stedy OT Short Term Goal 3 (Week 2): Pt will complete 1/3 components of donning pants with CGA  Skilled Therapeutic Interventions/Progress Updates:    Session focused on family education of RUE and RLE PROM. Handout provided and pt's wife was guided through each individual PROM exercise incorporating every joint of the RUE and RLE. Spanish interpretor present throughout session to ensure receptiveness. Pt's wife was able to return demonstration of every exercise with hands on correction/cueing provided. Pt reported pain as end range of motion in shoulder flexion and internal rotation. All questions answered to pt's wife satisfaction. Pt was left supine with all needs met, bed alarm set.    Therapy Documentation Precautions:  Precautions Precautions: Fall Restrictions Weight Bearing Restrictions: No   Therapy/Group: Individual Therapy  Curtis Sites 05/05/2020, 6:48 AM

## 2020-05-06 ENCOUNTER — Inpatient Hospital Stay (HOSPITAL_COMMUNITY): Payer: BC Managed Care – PPO | Admitting: Physical Therapy

## 2020-05-06 ENCOUNTER — Inpatient Hospital Stay (HOSPITAL_COMMUNITY): Payer: BC Managed Care – PPO

## 2020-05-06 ENCOUNTER — Inpatient Hospital Stay (HOSPITAL_COMMUNITY): Payer: BC Managed Care – PPO | Admitting: Occupational Therapy

## 2020-05-06 LAB — GLUCOSE, CAPILLARY
Glucose-Capillary: 114 mg/dL — ABNORMAL HIGH (ref 70–99)
Glucose-Capillary: 220 mg/dL — ABNORMAL HIGH (ref 70–99)
Glucose-Capillary: 139 mg/dL — ABNORMAL HIGH (ref 70–99)
Glucose-Capillary: 232 mg/dL — ABNORMAL HIGH (ref 70–99)

## 2020-05-06 NOTE — Progress Notes (Signed)
Speech Language Pathology Daily Session Note  Patient Details  Name: Gottfried Standish MRN: [id number] Date of Birth: 08-Oct-1961  Today's Date: 05/06/2020 SLP Individual Time: 0804-0900 SLP Individual Time Calculation (min): 56 min  Short Term Goals: Week 3: SLP Short Term Goal 1 (Week 3): Patient will demonstrate sustained attention to functional tasks for 15 minutes with Min verbal cues for redirection. SLP Short Term Goal 2 (Week 3): Patient will complete basic, familiar tasks with mod assist for functional problem solving. SLP Short Term Goal 3 (Week 3): Patient will vocalize on command in 50% of opportunites with Max A multimodal cues. SLP Short Term Goal 4 (Week 3): Patient will utilize gestures to answer basic yes/no questions in 75% of opportunities with Min A verbal and visual cues. SLP Short Term Goal 5 (Week 3): Patient will consume current diet with minimal overt s/s of aspiration Mod I for use of swallowing compensatory strategies.  Skilled Therapeutic Interventions:Skilled ST services focused on swallow and language skills. SLP facilitated identification of common objects from a field of 2 (grab spoon), pt demonstrated 4 out 6 accuarcy with max A multimodal cues to scan right of midline to locate objects. Pt demonstrated ability to vocalize "plate", "spoon", "cup" and "ball" in spanish after several trials of repeating and then during meal vocalized "cup", "spoon" and "plate" with reduced cuing given demonstration and semantic cues. Pt expressed phrase "this is a ball" in spanish when holding a brush and x5 attempts to express at phrase level, however unintelligible. Pt was also able to vocalize name "Aidyn" and "susanna" (wife's name) in unison then fading to repetition and written cues. Pt consumed breakfast tray, regular textured and thin via cup with supervision A verbal cues for small bites (althought SLP had cut french toast into small bites) and demonstrated mild anterior right  spillage with little awareness. Pt demonstrated basic problem solving during self-feeding with tray set up. Pt was left in room with family, call bell within reach and bed alarm set. ST recommends to continue skilled ST services.     Pain Pain Assessment Pain Score: 0-No pain  Therapy/Group: Individual Therapy  Caliah Kopke  [place] 05/06/2020, 3:38 PM

## 2020-05-06 NOTE — Patient Care Conference (Signed)
Inpatient RehabilitationTeam Conference and Plan of Care Update Date: 05/06/2020   Time: 1:41 PM    Patient Name: Brad Delacruz      Medical Record Number: [id number]  Date of Birth: 1961-12-04 Sex: Male         Room/Bed: 4W23C/4W23C-01 Payor Info: Payor: [place] / Plan: BCBS COMM PPO / Product Type: *No Product type* /    Admit Date/Time:  04/16/2020  3:38 PM  Primary Diagnosis:  Left middle cerebral artery stroke [facility])  Patient Active Problem List   Diagnosis Date Noted  . SAH (subarachnoid hemorrhage) ([place]) 04/16/2020  . Cerebral edema ([place]) 04/16/2020  . Pneumonia 04/16/2020  . Hypertensive emergency 04/16/2020  . Hyperlipidemia 04/16/2020  . Diabetes mellitus type II, uncontrolled ([place]) 04/16/2020  . Dysphagia 04/16/2020  . Protein-calorie malnutrition ([place]) 04/16/2020  . Urinary retention 04/16/2020  . Left middle cerebral artery stroke ([place]) 04/16/2020  . Acute respiratory failure ([place])   . Acute ischemic stroke ([place]) L MCA d/t L M1 oclusion s/p MCA stent 03/24/2020  . CVA (cerebral vascular accident) ([place]) 03/24/2020  . Middle cerebral artery embolism, left 03/24/2020    Expected Discharge Date: Expected Discharge Date: 05/13/20  Team Members Present: Physician leading conference: Dr. Alysia Penna Care Coodinator Present: Erlene Quan, BSW;Euclide Granito Tobin Chad, RN, BSN, [place] Nurse Present: Genene Churn, RN PT Present: Phylliss Bob, PTA OT Present: Darleen Crocker, OT SLP Present: Charolett Bumpers, SLP PPS Coordinator present : Ileana Ladd, Burna Mortimer, SLP     Current Status/Progress Goal Weekly Team Focus  Bowel/Bladder   continent with some episodes of incontinence. timed toileting. LBM 5/30  Continence of bowel and bladder, with no urinary retention  OOB to void, train B&B   Swallow/Nutrition/ Hydration             ADL's   Max A bathing at shower level sit<stand, Mod A UB dressing, Max A LB dressing sit<stand at sink,  Mod-Max A toilet transfers, Max A toileting  goals downgraded to mod overall and max A for LB dressing and toileting  ADL retraining, sitting/standing balance, NMR, praxis, family education   Mobility   modA bed mobility, modA squat pivot transfers, min/modA STS in [place], max/total A gait at wall rail. Significantly improved sitting balance with transition to standard w/c this week. able to propel with modA fading to minA with increased cues.  modA with minA for w/c mobilty  w/c propulsion, hands on family ed   Communication             Safety/Cognition/ Behavioral Observations            Pain   pain to R shoulder  reduce pain to tolerable level  assess q shift/prn. medicate prn and as ordered   Skin   MASD to groin and bruising to abdomen  skin free of further breakdown/infection  assess q shift/prn    Rehab Goals Patient on target to meet rehab goals: Yes Rehab Goals Revised: on target with current goals *See Care Plan and progress notes for long and short-term goals.     Barriers to Discharge  Current Status/Progress Possible Resolutions Date Resolved   Nursing                  PT                    OT                  SLP  Care Coordinator Medical stability Family education schedule for this week on target          Discharge Planning/Teaching Needs:  Goal to to discharge home with spouse and childrent to provide care  Education scheduled 5/31-6/4   Team Discussion:  Shoulder pain improved after injection. Wife and daughters attending family education training sessions. Sitting balance improved however still note trouble viewing objects on the right side. Language has improved, able to comment, name objects, speak names, count to 10, etc.  Revisions to Treatment Plan:  OT downgraded goals to mod assist overall. SLP upgraded diet to Regular textures and Supervision for cues. Recommend STEDY for discharge to use for transfers.     Medical Summary Current  Status: R Shouolder pain improving after injection Weekly Focus/Goal: Family ed, orthotic adn equipment management  Barriers to Discharge: Other (comments)  Barriers to Discharge Comments: severe aphasia Possible Resolutions to Barriers: cont rehab see above   Continued Need for Acute Rehabilitation Level of Care: The patient requires daily medical management by a physician with specialized training in physical medicine and rehabilitation for the following reasons: Direction of a multidisciplinary physical rehabilitation program to maximize functional independence : Yes Medical management of patient stability for increased activity during participation in an intensive rehabilitation regime.: Yes Analysis of laboratory values and/or radiology reports with any subsequent need for medication adjustment and/or medical intervention. : Yes   I attest that I was present, lead the team conference, and concur with the assessment and plan of the team.   Chana Bode B 05/06/2020, 1:41 PM

## 2020-05-06 NOTE — Progress Notes (Signed)
[facility] PHYSICAL MEDICINE & REHABILITATION PROGRESS NOTE   Subjective/Complaints:  No issues overnight   ROS: limited due to language/communication    Objective:   No results found. No results for input(s): WBC, HGB, HCT, PLT in the last 72 hours. No results for input(s): NA, K, CL, CO2, GLUCOSE, BUN, CREATININE, CALCIUM in the last 72 hours.  Intake/Output Summary (Last 24 hours) at 05/06/2020 0904 Last data filed at 05/06/2020 0509 Gross per 24 hour  Intake 480 ml  Output 600 ml  Net -120 ml     Physical Exam: Vital Signs Blood pressure 120/68, pulse 69, temperature 98.3 F (36.8 C), resp. rate 17, height 5' 5"  (1.651 m), weight 75.6 kg, SpO2 98 %.  General: No acute distress Mood and affect are appropriate Heart: Regular rate and rhythm no rubs murmurs or extra sounds Lungs: Clear to auscultation, breathing unlabored, no rales or wheezes Abdomen: Positive bowel sounds, soft nontender to palpation, nondistended Extremities: No clubbing, cyanosis, or edema Skin: No evidence of breakdown, no evidence of rash  0/5 RUE and RLE  Musculoskeletal: mild to moderate right shoulder pain with PROM in ER/iR or with ABD, wearing sling. 1/4" sublux  Assessment/Plan: 1. Functional deficits secondary to Left MCA with R HP and Aphasia  which require 3+ hours per day of interdisciplinary therapy in a comprehensive inpatient rehab setting.  Physiatrist is providing close team supervision and 24 hour management of active medical problems listed below.  Physiatrist and rehab team continue to assess barriers to discharge/monitor patient progress toward functional and medical goals  Care Tool:  Bathing    Body parts bathed by patient: Chest, Abdomen, Front perineal area, Right upper leg, Left upper leg, Face, Buttocks   Body parts bathed by helper: Right arm, Left arm, Buttocks, Right lower leg, Left lower leg Body parts n/a: Right arm, Left arm, Right lower leg, Left lower leg    Bathing assist Assist Level: Maximal Assistance - Patient 24 - 49%     Upper Body Dressing/Undressing Upper body dressing   What is the patient wearing?: Button up shirt    Upper body assist Assist Level: Moderate Assistance - Patient 50 - 74%    Lower Body Dressing/Undressing Lower body dressing      What is the patient wearing?: Pants, Incontinence brief     Lower body assist Assist for lower body dressing: Maximal Assistance - Patient 25 - 49%     Toileting Toileting    Toileting assist Assist for toileting: Maximal Assistance - Patient 25 - 49%     Transfers Chair/bed transfer  Transfers assist     Chair/bed transfer assist level: Moderate Assistance - Patient 50 - 74%     Locomotion Ambulation   Ambulation assist   Ambulation activity did not occur: Safety/medical concerns  Assist level: 2 helpers Assistive device: Other (comment)(rail) Max distance: 15   Walk 10 feet activity   Assist  Walk 10 feet activity did not occur: Safety/medical concerns  Assist level: 2 helpers Assistive device: Other (comment)   Walk 50 feet activity   Assist Walk 50 feet with 2 turns activity did not occur: Safety/medical concerns         Walk 150 feet activity   Assist Walk 150 feet activity did not occur: Safety/medical concerns         Walk 10 feet on uneven surface  activity   Assist Walk 10 feet on uneven surfaces activity did not occur: Safety/medical concerns  Wheelchair     Assist        Wheelchair assist level: Minimal Assistance - Patient > 75% Max wheelchair distance: 120    Wheelchair 50 feet with 2 turns activity    Assist        Assist Level: Minimal Assistance - Patient > 75%   Wheelchair 150 feet activity     Assist      Assist Level: Moderate Assistance - Patient 50 - 74%   Blood pressure 120/68, pulse 69, temperature 98.3 F (36.8 C), resp. rate 17, height 5' 5"  (1.651 m), weight 75.6 kg,  SpO2 98 %.  Medical Problem List and Plan: 1.  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  TEE and loop recorder placement to be discussed as outpatient.             -patient may shower             -ELOS/Goals:  MinA PT, OT, SLP Cont PT, OT, SLP Team conference today please see physician documentation under team conference tab, met with team  to discuss problems,progress, and goals. Formulized individual treatment plan based on medical history, underlying problem and comorbidities.  2.  Antithrombotics: -DVT/anticoagulation: Subcutaneous heparin.  Venous Doppler studies negative             -antiplatelet therapy: Brilinta 90 mg twice daily, aspirin 81 mg daily 3. Pain Management: Tylenol as needed. Well controlled.    Right shoulder pain, sling for comfort- improved after R subacromial injection   5/31: -spoke with pt/family re: RUE, treatment considerations. Continue ROM as tolerated, support while seated/sleeping/standing with sling/pillows, lap tray. Lidocaine patch helps. Discussed wit hPT, now that pt is in regular WC can use hemi tray or arm trough  4. Mood: Amantadine 100 mg twice daily             -antipsychotic agents: N/A 5. Neuropsych: This patient is not capable of making decisions on his own behalf. 6. Skin/Wound Care: Routine skin checks 7. Fluids/Electrolytes/Nutrition: Routine in and outs with follow-up chemistries 8.  Dysphagia.  Dysphagia #1 honey thick liquids.   Dietary follow-up as well as speech therapy 9.  Diabetes mellitus.  Hemoglobin A1c 9.2.  NovoLog 5 units 2 times daily, Lantus insulin 35 units twice daily. Uncontrolled. CBG (last 3)  Recent Labs    05/05/20 1143 05/05/20 2104 05/06/20 0558  GLUCAP 135* 177* 114*  CBGs elevated due to steroid,improving  lantus 34U BID 10.  Hypertension.  Cozaar 50 mg daily.   Vitals:   05/05/20 2058 05/06/20 0507   BP: 117/67 120/68  Pulse: 71 69  Resp: 16 17  Temp: 98.2 F (36.8 C) 98.3 F (36.8 C)  SpO2: 100% 98%   6/2 controlled   11.  Hyperlipidemia.  Lipitor 12.  Obesity.  BMI 29.50.  Dietary follow-up  13.  Post stroke shoulder pain - discussed with OT, add Kpad and aspercreme, tylenol for pain , no imaging needed at this time no trauma    xray R shoulder shows AC jt arthritis  corticosteroid injection R shoulder performed 5/26      14. Urine retention--volumes variable  -urecholine trial 55m tid  Flomax 0.465m  5/31 volumes OK.     LOS: 20 days A FACE TO FACE EVALUATION WAS PERFORMED  AnCharlett Blake/01/2020, 9:04 AM

## 2020-05-06 NOTE — Progress Notes (Signed)
Team Conference Report to Patient/Family  Team Conference discussion was reviewed with the patient and caregiver, including goals, any changes in plan of care and target discharge date.  Patient and caregiver express understanding and are in agreement.  The patient has a target discharge date of 05/13/20.  Brad Delacruz 05/06/2020, 2:13 PM

## 2020-05-06 NOTE — Progress Notes (Signed)
Physical Therapy Session Note  Patient Details  Name: Brad Delacruz MRN: [id number] Date of Birth: 06-15-1961  Today's Date: 05/06/2020 PT Individual Time: 1300-1410 PT Individual Time Calculation (min): 70 min   Short Term Goals: Week 3:  PT Short Term Goal 1 (Week 3): Pt will propel WC 11ft with min assistusing hemi technique. PT Short Term Goal 2 (Week 3): Pt will transfer to and from [facility] with mod assist PT Short Term Goal 3 (Week 3): Pt will ambulate 74ft with max assist of 1 and UE on rail to force use of RLE PT Short Term Goal 4 (Week 3): pt will perform bed mobiltiy with mod assist consistently  Skilled Therapeutic Interventions/Progress Updates:   Pt received sitting in WC and agreeable to PT. Pt noticeably fidgety in WC. PT quested pt if needing to use restroom. Pt nods yes. Stedy transfer to [facility] over toilet. Pt able to void bladder, pt appeared to attempt bowel movement without success. RN notified.   PT treatment focused on improved independence with WC mobility and family education for transfers to bed height at 21 inches. WC mobility 2 x 138ft with min assist overall and max cues for improve coordination of LUE/LLE to prevent veer to the R and improve use of momentum to decreased overall workload.    Squat pivot/stand pivot transfer training to  And from mat table x 4 with PT and x 6 with wife. Moderate-max assist overall from both PT or wife as well as max cues for proper set up to improve safety and success to pt and wife. Pt's wife encouraged provide more instruction for set up and improve positioning of pt, with intermittent carryover. Once back in room PT instructed pt and wife in proper lateral scoot technique as well as importance of positioning EOB prior to sit>supine for safety and efficiency. Sit>supine through long sitting with min assist for RLE management and heavy use of bed features. Pt removed RUEsling and instructed wife in proper UE positioning to reduce tone  development.       Therapy Documentation Precautions:  Precautions Precautions: Fall Restrictions Weight Bearing Restrictions: No    Vital Signs: Therapy Vitals Temp: 98 F (36.7 C) Pulse Rate: 72 Resp: 17 BP: (!) 105/59 Patient Position (if appropriate): Lying Oxygen Therapy SpO2: 97 % O2 Device: Room Air Pain: Pain Assessment Pain Score: 0-No pain    Therapy/Group: Individual Therapy  Golden Pop 05/06/2020, 5:45 PM

## 2020-05-06 NOTE — Progress Notes (Signed)
Occupational Therapy Session Note  Patient Details  Name: Brad Delacruz MRN: [id number] Date of Birth: 1961-07-14  Today's Date: 05/06/2020 OT Individual Time: 0905-1000 OT Individual Time Calculation (min): 55 min    Short Term Goals: Week 2:  OT Short Term Goal 1 (Week 2): Pt will complete UB dressing with Mod A OT Short Term Goal 1 - Progress (Week 2): Not met OT Short Term Goal 2 (Week 2): Pt will complete toilet transfer with 1 assist without Stedy OT Short Term Goal 2 - Progress (Week 2): Met OT Short Term Goal 3 (Week 2): Pt will complete 1/3 components of donning pants with CGA OT Short Term Goal 3 - Progress (Week 2): Not met Week 3:  OT Short Term Goal 1 (Week 3): STGs=LTGs secondary to upcoming discharge   Skilled Therapeutic Interventions/Progress Updates:    Pt greeted at time of session reclined in bed with interpreter and wife in room, agreeable to OT session to focus on functional transfers, toileting, and caregiver education/training. Pt transitioned to sitting up at EOB with Mod A to manage BLEs and scoot to EOB. Wife donned RUE sling with min cues/instruction from therapist for problem solving straps. Education and training with wife as primary caregiver for transfers bed > drop arm BSC > manual w/c > toilet (with BSC over toilet). Mod A for all transfers toward his L side (unaffected side) and Max toward R side. Education for wife to block RLE, hand placement on hips, how to lower/raise arm rests on [facility] and wheelchair with demonstration. Extensive instruction with wife on importance of giving the patient cues for when to stand, patient's hand placement, pacing, waiting to stand until she is ready. Wife able to instruct patient on when to stand from toilet and wife performed clothing management to don over hips total assist. Reviewed putting on leg rests with wife as well, able to place on the patient's wheelchair with verbal cues. Wife receptive to instructions and  training, will require repetition and further training on transfers. Pt up in chair with alarm on, all needs met, wife in room.  Therapy Documentation Precautions:  Precautions Precautions: Fall Restrictions Weight Bearing Restrictions: No   Therapy/Group: Individual Therapy  Viona Gilmore 05/06/2020, 10:15 AM

## 2020-05-07 ENCOUNTER — Inpatient Hospital Stay (HOSPITAL_COMMUNITY): Payer: BC Managed Care – PPO | Admitting: Physical Therapy

## 2020-05-07 ENCOUNTER — Inpatient Hospital Stay (HOSPITAL_COMMUNITY): Payer: BC Managed Care – PPO | Admitting: Occupational Therapy

## 2020-05-07 ENCOUNTER — Inpatient Hospital Stay (HOSPITAL_COMMUNITY): Payer: BC Managed Care – PPO | Admitting: Speech Pathology

## 2020-05-07 LAB — GLUCOSE, CAPILLARY
Glucose-Capillary: 119 mg/dL — ABNORMAL HIGH (ref 70–99)
Glucose-Capillary: 151 mg/dL — ABNORMAL HIGH (ref 70–99)
Glucose-Capillary: 177 mg/dL — ABNORMAL HIGH (ref 70–99)
Glucose-Capillary: 155 mg/dL — ABNORMAL HIGH (ref 70–99)

## 2020-05-07 MED ORDER — TOBRAMYCIN-DEXAMETHASONE 0.3-0.1 % OP SUSP
1.0000 [drp] | Freq: Four times a day (QID) | OPHTHALMIC | Status: DC
  Administered 2020-05-07 – 2020-05-08 (×3): 1 [drp] via OPHTHALMIC
  Filled 2020-05-07: qty 2.5

## 2020-05-07 NOTE — Progress Notes (Signed)
Occupational Therapy Session Note  Patient Details  Name: Brad Delacruz MRN: [id number] Date of Birth: 09/08/1961  Today's Date: 05/07/2020 OT Individual Time: 0800-0910 OT Individual Time Calculation (min): 70 min    Short Term Goals: Week 2:  OT Short Term Goal 1 (Week 2): Pt will complete UB dressing with Mod A OT Short Term Goal 1 - Progress (Week 2): Not met OT Short Term Goal 2 (Week 2): Pt will complete toilet transfer with 1 assist without Stedy OT Short Term Goal 2 - Progress (Week 2): Met OT Short Term Goal 3 (Week 2): Pt will complete 1/3 components of donning pants with CGA OT Short Term Goal 3 - Progress (Week 2): Not met Week 3:  OT Short Term Goal 1 (Week 3): STGs=LTGs secondary to upcoming discharge  Skilled Therapeutic Interventions/Progress Updates:    Pt greeted at time of session in bed with HOB elevated, wife and interpreter in room. Supine to sitting up at EOB with Min A, pt with urgency to use the bathroom. Wife performed squat pivot transfer bed > wheelchair toward pt's L side and pt set up at toilet with drop arm BSC, wife assisted with SPT to toilet with pt able to assist by pushing through LLE and wife blocking RLE. Static standing with Max A to maintain standing balance and total assist for doffing clothing, pt with constipation and required extensive time on toilet but did have BM. Wife performed hygiene with techniques to decrease pushing towards his R side by weight shifting to his L while sitting. LB dressing total A to don brief and pants.  Pt did attempt to assist with donning pants by trying to place the LLE in the pants leg and reach down to pull them up, but he still needed total assist for this.  He also attempted to place the RLE in the pants prior to this, but he could not exhibit any AROM in the RLE to assist.  Wife performed squat pivot back to wheelchair as well as to/from bed with demonstration and instructions provided for scooting hips toward edge  of surface, blocking his RLE, foot placement, lateral weight shifting, and use of gait belt.  She was instructed on different techniques to assist with transfer including using gloves for increased friction as well as using gait belt, in addition to pulling on his waist band.  Wife did like using gait belt in addition to holding onto pants for better grip. Encouraged wife throughout session to perform wheelchair/BSC management and provide cues to the patient to improve comfort in prep for DC home next week. Pt up in wheelchair with alarm on, wife and interpreter in room, all needs met.   Therapy Documentation Precautions:  Precautions Precautions: Fall Restrictions Weight Bearing Restrictions: No     Therapy/Group: Individual Therapy   C  05/07/2020, 9:11 AM  

## 2020-05-07 NOTE — Progress Notes (Signed)
Patient ID: Brad Delacruz, male   DOB: Mar 03, 1961, 59 y.o.   MRN: [id number]  Daughter requesting additional family education on 6/7 and 6/8, due to spending more time translating. SW has asked interpreting about requesting a interpretor that family feels understands their dialect better, will follow up. SW also asked administration about family bringing in their own breakfast for patient. Due to patient beginning therapy at 8AM and receiving breakfast late. Daughter has already brought this concern to DON, sw will follow up with daughter.

## 2020-05-07 NOTE — Progress Notes (Signed)
Patient ID: Brad Delacruz, male   DOB: July 21, 1961, 59 y.o.   MRN: [id number]   SW will follow up with nursing in AM to see about feeding/supervision about 7:30-8AM. Family would like patient to eat a little earlier.

## 2020-05-07 NOTE — Progress Notes (Signed)
Physical Therapy Weekly Progress Note  Patient Details  Name: Brad Delacruz MRN: [id number] Date of Birth: 1961/08/28  Beginning of progress report period: Apr 30, 2020 End of progress report period: May 07, 2020  Today's Date: 05/07/2020 PT Individual Time: 1300-1410  PT Individual Time Calculation (min): 70 min   Patient has met 2 of 3 short term goals.  Pt is making slow progress towards LTG of mod assist. Over the past week pt's postureal control in sitting and safety with transfers have improved significantly allowing pt to perform bed mobility with mod assist, transfer with mod assist relatively consistently, and propel WC with min assist. Family training has been initiated and wife and daughter have demonstrated improved ability to perform transfers with pt. Family continues to need to improve communication for sequencing and set up as pt is still mildly impulsive and demonstrates poor motor planning due to dense L hemiplegia and inattention.   Patient continues to demonstrate the following deficits muscle weakness and muscle paralysis, decreased cardiorespiratoy endurance, impaired timing and sequencing, abnormal tone, motor apraxia, decreased coordination and decreased motor planning, decreased visual perceptual skills and field cut, decreased attention to left, decreased motor planning and ideational apraxia, decreased attention, decreased awareness, decreased problem solving, decreased safety awareness, decreased memory and delayed processing and decreased sitting balance, decreased standing balance, decreased postural control, hemiplegia and decreased balance strategies and therefore will continue to benefit from skilled PT intervention to increase functional independence with mobility.  Patient progressing toward long term goals..  Continue plan of care.  PT Short Term Goals Week 3:  PT Short Term Goal 1 (Week 3): Pt will propel WC 172f with min assistusing hemi technique. PT  Short Term Goal 2 (Week 3): [facility] Short Term Goal 2 - Progress (Week 3): Met PT Short Term Goal 3 (Week 3): Pt will ambulate 267fwith max assist of 1 and UE on rail to force use of RLE PT Short Term Goal 3 - Progress (Week 3): Not met PT Short Term Goal 4 (Week 3): pt will perform bed mobiltiy with mod assist consistently PT Short Term Goal 4 - Progress (Week 3): Met Week 4:  PT Short Term Goal 1 (Week 4): STG=LTG due to ELOS  Skilled Therapeutic Interventions/Progress Updates:   Pt received sitting in WC and agreeable to PT. Squat pivot transfer to mat table with mod assist on the L. Sitting balance with min assist initially, then progressing to supervision assist. Sit>supine with mod assist for RLE management through long sitting. SUpine NMR to force voilational activation of the RLE. Bridges x 10 with trace extension. Hip/knee flexion extension in open chain x 12 BLE with on active movement on the R. Rolling to the L with max assist for LE/UE control powder board hip/knee flexion extension with inconsistent/trace extension pattern noted. Return to supine with mod assist. RUE forward reach with total A, mild pain in the shoulder with ER beyond neutral and shoulder flexion >45 deg. [facility]   Gait training at rail in hall x 3077fith total A +2 for safety/WC follow and to advance the RLE, trace extension response in stance on the R, but require to block to prevent buckle.   Car transfer training with PT x 2 with Squat pivot then SB. Wife educated on the pros and cons for each style, and requested to attempt SB. Mod assist to the  R into car and mod-max assist to return to [facility]. Max multimodal cues for SB management and placement as well and how to cue pt for proper sequencing.   Pt returned to room and performed stand pivot on the L with mod-max assist to bed. Sit>supine completed with mod assist through long sitting.  Pt left supine in bed with call bell in reach and all needs met.        Therapy Documentation Precautions:  Precautions Precautions: Fall Restrictions Weight Bearing Restrictions: No Pain: Pain Assessment Pain Scale: 0-10 Pain Score: 0-No pain   Therapy/Group: Individual Therapy  Lorie Phenix 05/07/2020, 2:23 PM

## 2020-05-07 NOTE — Progress Notes (Signed)
Speech Language Pathology Daily Session Note  Patient Details  Name: Brad Delacruz MRN: [id number] Date of Birth: May 06, 1961  Today's Date: 05/07/2020 SLP Individual Time: 2751-7001 SLP Individual Time Calculation (min): 43 min  Short Term Goals: Week 3: SLP Short Term Goal 1 (Week 3): Patient will demonstrate sustained attention to functional tasks for 15 minutes with Min verbal cues for redirection. SLP Short Term Goal 2 (Week 3): Patient will complete basic, familiar tasks with mod assist for functional problem solving. SLP Short Term Goal 3 (Week 3): Patient will vocalize on command in 50% of opportunites with Max A multimodal cues. SLP Short Term Goal 4 (Week 3): Patient will utilize gestures to answer basic yes/no questions in 75% of opportunities with Min A verbal and visual cues. SLP Short Term Goal 5 (Week 3): Patient will consume current diet with minimal overt s/s of aspiration Mod I for use of swallowing compensatory strategies.  Skilled Therapeutic Interventions: Pt was seen for skilled ST targeting communication goals. Spanish translator present. Pt expressed fatigue and appeared more visibly frustrated with his communication today in comparison to las encounter with this SLP. During writing tasks, pt able to copy "o", however otherwise required hand over hand assist to trace his name. Pt identified common objects by pointing from field of 2 with 3/5 accuracy. When provided Max A cues including orthographic cues and models, pt able to verbalize or repeat names of common objects during confrontation naming tasks with 4/6 accuracy. Pt returned to room and left sitting in chair with wife and interpretor present, alarm set and needs within reach. Continue per current plan of care.        Pain Pain Assessment Pain Scale: 0-10 Pain Score: 0-No pain  Therapy/Group: Individual Therapy  [name] 05/07/2020, 7:09 AM

## 2020-05-07 NOTE — Progress Notes (Signed)
[place] PHYSICAL MEDICINE & REHABILITATION PROGRESS NOTE   Subjective/Complaints:  No issues overnight   ROS: limited due to language/communication    Objective:   No results found. No results for input(s): WBC, HGB, HCT, PLT in the last 72 hours. No results for input(s): NA, K, CL, CO2, GLUCOSE, BUN, CREATININE, CALCIUM in the last 72 hours.  Intake/Output Summary (Last 24 hours) at 05/07/2020 0800 Last data filed at 05/06/2020 2130 Gross per 24 hour  Intake 540 ml  Output 300 ml  Net 240 ml     Physical Exam: Vital Signs Blood pressure 112/71, pulse 64, temperature 98 F (36.7 C), resp. rate 16, height 5\' 5"  (1.651 m), weight 75.6 kg, SpO2 99 %.  General: No acute distress Mood and affect are appropriate Heart: Regular rate and rhythm no rubs murmurs or extra sounds Lungs: Clear to auscultation, breathing unlabored, no rales or wheezes Abdomen: Positive bowel sounds, soft nontender to palpation, nondistended Extremities: No clubbing, cyanosis, or edema Skin: No evidence of breakdown, no evidence of rash  0/5 RUE and RLE  Musculoskeletal: mild to moderate right shoulder pain with PROM in ER/iR or with ABD, wearing sling. 1/4" sublux  Assessment/Plan: 1. Functional deficits secondary to Left MCA with R HP and Aphasia  which require 3+ hours per day of interdisciplinary therapy in a comprehensive inpatient rehab setting.  Physiatrist is providing close team supervision and 24 hour management of active medical problems listed below.  Physiatrist and rehab team continue to assess barriers to discharge/monitor patient progress toward functional and medical goals  Care Tool:  Bathing    Body parts bathed by patient: Chest, Abdomen, Front perineal area, Right upper leg, Left upper leg, Face, Buttocks   Body parts bathed by helper: Right arm, Left arm, Buttocks, Right lower leg, Left lower leg Body parts n/a: Right arm, Left arm, Right lower leg, Left lower leg    Bathing assist Assist Level: Maximal Assistance - Patient 24 - 49%     Upper Body Dressing/Undressing Upper body dressing   What is the patient wearing?: Button up shirt    Upper body assist Assist Level: Moderate Assistance - Patient 50 - 74%    Lower Body Dressing/Undressing Lower body dressing      What is the patient wearing?: Pants, Incontinence brief     Lower body assist Assist for lower body dressing: Total Assistance - Patient < 25%     Toileting Toileting    Toileting assist Assist for toileting: Total Assistance - Patient < 25%     Transfers Chair/bed transfer  Transfers assist     Chair/bed transfer assist level: Moderate Assistance - Patient 50 - 74%     Locomotion Ambulation   Ambulation assist   Ambulation activity did not occur: Safety/medical concerns  Assist level: 2 helpers Assistive device: Other (comment)(rail) Max distance: 15   Walk 10 feet activity   Assist  Walk 10 feet activity did not occur: Safety/medical concerns  Assist level: 2 helpers Assistive device: Other (comment)   Walk 50 feet activity   Assist Walk 50 feet with 2 turns activity did not occur: Safety/medical concerns         Walk 150 feet activity   Assist Walk 150 feet activity did not occur: Safety/medical concerns         Walk 10 feet on uneven surface  activity   Assist Walk 10 feet on uneven surfaces activity did not occur: Safety/medical concerns  Wheelchair     Assist        Wheelchair assist level: Minimal Assistance - Patient > 75% Max wheelchair distance: 120    Wheelchair 50 feet with 2 turns activity    Assist        Assist Level: Minimal Assistance - Patient > 75%   Wheelchair 150 feet activity     Assist      Assist Level: Moderate Assistance - Patient 50 - 74%   Blood pressure 112/71, pulse 64, temperature 98 F (36.7 C), resp. rate 16, height 5\' 5"  (1.651 m), weight 75.6 kg, SpO2 99  %.  Medical Problem List and Plan: 1.  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  TEE and loop recorder placement to be discussed as outpatient.             -patient may shower             -ELOS/Goals:  MinA PT, OT, SLP Cont PT, OT, SLP D/C 6/9 2.  Antithrombotics: -DVT/anticoagulation: Subcutaneous heparin.  Venous Doppler studies negative             -antiplatelet therapy: Brilinta 90 mg twice daily, aspirin 81 mg daily 3. Pain Management: Tylenol as needed. Well controlled.    Right shoulder pain, sling for comfort- improved after R subacromial injection   5/31: -spoke with pt/family re: RUE, treatment considerations. Continue ROM as tolerated, support while seated/sleeping/standing with sling/pillows, lap tray. Lidocaine patch helps. Discussed wit hPT, now that pt is in regular WC can use hemi tray or arm trough  4. Mood: Amantadine 100 mg twice daily             -antipsychotic agents: N/A 5. Neuropsych: This patient is not capable of making decisions on his own behalf. 6. Skin/Wound Care: Routine skin checks 7. Fluids/Electrolytes/Nutrition: Routine in and outs with follow-up chemistries 8.  Dysphagia.  Dysphagia #1 honey thick liquids.   Dietary follow-up as well as speech therapy 9.  Diabetes mellitus.  Hemoglobin A1c 9.2.  NovoLog 5 units 2 times daily, Lantus insulin 35 units twice daily. Uncontrolled. CBG (last 3)  Recent Labs    05/06/20 1628 05/06/20 2110 05/07/20 0627  GLUCAP 139* 232* 119*  CBGs elevated due to steroid,improving  lantus 34U BID 10.  Hypertension.  Cozaar 50 mg daily.   Vitals:   05/06/20 1935 05/07/20 0501  BP: 109/72 112/71  Pulse: 87 64  Resp: 16 16  Temp: 98.3 F (36.8 C) 98 F (36.7 C)  SpO2: 97% 99%   6/23controlled   11.  Hyperlipidemia.  Lipitor 12.  Obesity.  BMI 29.50.  Dietary follow-up  13.  Post stroke shoulder pain -  discussed with OT, add Kpad and aspercreme, tylenol for pain , no imaging needed at this time no trauma    xray R shoulder shows AC jt arthritis  corticosteroid injection R shoulder performed 5/26 , greater pain free range      14. Urine retention--volumes variable  -urecholine trial 10mg  tid  Flomax 0.4mg    5/31 volumes OK.     LOS: 21 days A FACE TO FACE EVALUATION WAS PERFORMED  Charlett Blake 05/07/2020, 8:00 AM

## 2020-05-07 NOTE — Plan of Care (Signed)
  Problem: Consults Goal: RH STROKE PATIENT EDUCATION Description: See Patient Education module for education specifics  Outcome: Progressing Goal: Nutrition Consult-if indicated Outcome: Progressing Goal: Diabetes Guidelines if Diabetic/Glucose > 140 Description: If diabetic or lab glucose is > 140 mg/dl - Initiate Diabetes/Hyperglycemia Guidelines & Document Interventions  Outcome: Progressing   Problem: RH BLADDER ELIMINATION Goal: RH STG MANAGE BLADDER WITH ASSISTANCE Description: STG Manage Bladder With min Assistance Outcome: Progressing   Problem: RH SAFETY Goal: RH STG ADHERE TO SAFETY PRECAUTIONS W/ASSISTANCE/DEVICE Description: STG Adhere to Safety Precautions With supervision Assistance/Device. Outcome: Progressing   Problem: RH COGNITION-NURSING Goal: RH STG USES MEMORY AIDS/STRATEGIES W/ASSIST TO PROBLEM SOLVE Description: STG Uses Memory Aids/Strategies With min Assistance to Problem Solve. Outcome: Progressing   Problem: RH KNOWLEDGE DEFICIT Goal: RH STG INCREASE KNOWLEDGE OF DIABETES Description: Pt/family will demonstrate understanding of DM management with min assist using handout/booklets in spanish and assistance with interrupter prior to DC Outcome: Progressing Goal: RH STG INCREASE KNOWLEDGE OF HYPERTENSION Description: Pt/family will demonstrate understanding of HTN management with min assist using handout/booklets in spanish and assistance with interrupter prior to DC Outcome: Progressing Goal: RH STG INCREASE KNOWLEDGE OF DYSPHAGIA/FLUID INTAKE Description: Pt/family will demonstrate understanding of dysphagia management with min assist using handout/booklets in spanish and assistance with interrupter prior to DC Outcome: Progressing Goal: RH STG INCREASE KNOWLEGDE OF HYPERLIPIDEMIA Description: Pt/family will demonstrate understanding of HLD management with min assist using handout/booklets in spanish and assistance with interrupter prior to DC Outcome:  Progressing Goal: RH STG INCREASE KNOWLEDGE OF STROKE PROPHYLAXIS Description: Pt/family will demonstrate understanding of stroke prevention with min assist using handout/booklets in spanish and assistance with interrupter prior to DC Outcome: Progressing

## 2020-05-08 ENCOUNTER — Inpatient Hospital Stay (HOSPITAL_COMMUNITY): Payer: BC Managed Care – PPO | Admitting: Physical Therapy

## 2020-05-08 ENCOUNTER — Inpatient Hospital Stay (HOSPITAL_COMMUNITY): Payer: BC Managed Care – PPO | Admitting: Speech Pathology

## 2020-05-08 ENCOUNTER — Inpatient Hospital Stay (HOSPITAL_COMMUNITY): Payer: BC Managed Care – PPO | Admitting: Occupational Therapy

## 2020-05-08 LAB — GLUCOSE, CAPILLARY
Glucose-Capillary: 92 mg/dL (ref 70–99)
Glucose-Capillary: 165 mg/dL — ABNORMAL HIGH (ref 70–99)
Glucose-Capillary: 186 mg/dL — ABNORMAL HIGH (ref 70–99)
Glucose-Capillary: 205 mg/dL — ABNORMAL HIGH (ref 70–99)

## 2020-05-08 MED ORDER — POLYVINYL ALCOHOL 1.4 % OP SOLN
1.0000 [drp] | Freq: Two times a day (BID) | OPHTHALMIC | Status: DC
  Administered 2020-05-08 – 2020-05-13 (×11): 1 [drp] via OPHTHALMIC
  Filled 2020-05-08: qty 15

## 2020-05-08 NOTE — Progress Notes (Signed)
Occupational Therapy Session Note  Patient Details  Name: Brad Delacruz MRN: [id number] Date of Birth: 11-19-1961  Today's Date: 05/08/2020 OT Individual Time: 6553-7482 OT Individual Time Calculation (min): 60 min    Short Term Goals: Week 2:  OT Short Term Goal 1 (Week 2): Pt will complete UB dressing with Mod A OT Short Term Goal 1 - Progress (Week 2): Not met OT Short Term Goal 2 (Week 2): Pt will complete toilet transfer with 1 assist without Stedy OT Short Term Goal 2 - Progress (Week 2): Met OT Short Term Goal 3 (Week 2): Pt will complete 1/3 components of donning pants with CGA OT Short Term Goal 3 - Progress (Week 2): Not met Week 3:  OT Short Term Goal 1 (Week 3): STGs=LTGs secondary to upcoming discharge  Skilled Therapeutic Interventions/Progress Updates:    Pt greeted at time of session reclined in bed with wife and interpreter present (began with remote and transitioned to in person translator) agreeable to work on family ed for bathing and dressing. Bed mobility supine to sitting at EOB with Min A to scoot hips to edge, wife performed squat pivot bed to wheelchair with good form, UB/LB bathing at sink level with education/training on performing sponge baths at home instead of shower level and wife receptive for safety. UB bathing with Min-Mod A with pt showing increased initiation this date for washing UB/LB, cues throughout for attending to task and thoroughness. LB bathing Mod A with pt able to wash BLEs but needed assistance with feet, attempted figure four but muscle tightness limiting, assistance in standing to wash buttocks but pt did help with washing periarea with instruction. Wife/caregiver ed and training for her to block his RLE to bring up into standing, techniques to have the pt assist with maintaining standing balance at sink, and for her to assist with LB bathing/dressing while supporting him in standing while she assists. LB dressing with Max A with pt assisting  to thread LLE and wife assist for RLE and static stand while wife supported in standing and donned over hips. Wife is receptive to cues/instructions for blocking RLE and supporting the pt in standing while she assists in LB bathing/dressing tasks. Wife practiced donning/doffing RUE support sling x2 with minimal assistance to correct. Wife and patient education on home set up for sink level bathing/sponge baths, importance of supporting RUE in transfers, and ROM techniques to prevent contracture development. Also discussed R UE positioning strategies to prevent subluxation. Pt up in chair with alarm on, wife in room, all needs met.   Therapy Documentation Precautions:  Precautions Precautions: Fall Restrictions Weight Bearing Restrictions: No   Therapy/Group: Individual Therapy  Viona Gilmore 05/08/2020, 9:32 AM

## 2020-05-08 NOTE — Progress Notes (Signed)
Speech Language Pathology Weekly Progress and Session Note  Patient Details  Name: Brad Delacruz MRN: [id number] Date of Birth: 05/28/1961  Beginning of progress report period:  05/01/2020 End of progress report period:  05/08/2020    Short Term Goals: Week 3: SLP Short Term Goal 1 (Week 3): Patient will demonstrate sustained attention to functional tasks for 15 minutes with Min verbal cues for redirection. SLP Short Term Goal 1 - Progress (Week 3): Met SLP Short Term Goal 2 (Week 3): Patient will complete basic, familiar tasks with mod assist for functional problem solving. SLP Short Term Goal 2 - Progress (Week 3): Met SLP Short Term Goal 3 (Week 3): Patient will vocalize on command in 50% of opportunites with Max A multimodal cues. SLP Short Term Goal 3 - Progress (Week 3): Progressing toward goal SLP Short Term Goal 4 (Week 3): Patient will utilize gestures to answer basic yes/no questions in 75% of opportunities with Min A verbal and visual cues. SLP Short Term Goal 4 - Progress (Week 3): Met SLP Short Term Goal 5 (Week 3): Patient will consume current diet with minimal overt s/s of aspiration Mod I for use of swallowing compensatory strategies. SLP Short Term Goal 5 - Progress (Week 3): Met    New Short Term Goals: Week 4: SLP Short Term Goal 1 (Week 4): STG=LTG due to remaining length of stay  Weekly Progress Updates:   Pt has made excellent gains this reporting period and has met 4 out of 5 short term goals.  Pt is currently mod-max assist for tasks due to aphasia, cognitive impairments, and dysphagia.  Pt has demonstrated improved toleration of regular textures, verbal expression for naming of basic items, comprehension of basic, immediately pertinent information, and sustained attention to task.  Pt is consuming a regular diet and thin liquids with full supervision for use of swallowing precautions.  Pt and family education is ongoing.  Pt would continue to benefit from skilled  ST while inpatient in order to maximize functional independence and reduce burden of care prior to discharge.  Anticipate that pt will need 24/7 supervision at discharge in addition to ST follow at next level of care.   Intensity: Minumum of 1-2 x/day, 30 to 90 minutes Frequency: 3 to 5 out of 7 days Duration/Length of Stay: 05/13/20 vs SNF Treatment/Interventions: Cognitive remediation/compensation;Dysphagia/aspiration precaution training;Internal/external aids;Speech/Language facilitation;Therapeutic Activities;Environmental controls;Cueing hierarchy;Functional tasks;Patient/family education          

## 2020-05-08 NOTE — Progress Notes (Signed)
Physical Therapy Session Note  Patient Details  Name: Brad Delacruz MRN: [id number] Date of Birth: November 27, 1961  Today's Date: 05/08/2020 PT Individual Time: 0900-1010 PT Individual Time Calculation (min): 70 min   Short Term Goals: Week 4:  PT Short Term Goal 1 (Week 4): STG=LTG due to ELOS  Skilled Therapeutic Interventions/Progress Updates:   Pt received sitting in WC and agreeable to PT. Pt instructed in WC mobility x 153f with min assist intermittent for improved coordination and turning technique to avoid obstacles on the R. Pt transported to day room. Reciprocal marches on kinetron 3 x 1 min with 2 min rest break between bouts. Sit<>stand with mod assist in parallel bars x 4 with RLE blocked to prevent buckle. Pre-gait stepping with the LLE to force WB through RLE. Max assist to engage extensors on the R with por carry over.   Blocked practice squat pivot transfer training with Wife and with PT. Mod assist overall from PT with max cues for set up and postioning of the R foot and Rside pelvis.  Mod assist for tranfers to the L with  Assist from wife and max assist to the R. Max multimodal cues to encourage/facilitate weight shift anteriorly prior to transfer to allow improved WB through BLE and improved safety of transfer for ptand caregiver.   Pt requesting to return to bed at end of session. Squat pivot R with mod assist from PT and min cues for UE placement to improve sequencing. Sit>supine in reclined position with min assist for RLE management using long sitting technique.   Pt left in bed with call bell in reach and all needs met.       Therapy Documentation Precautions:  Precautions Precautions: Fall Restrictions Weight Bearing Restrictions: No Pain:   denies   Therapy/Group: Individual Therapy  ALorie Phenix6/03/2020, 10:19 AM

## 2020-05-08 NOTE — Progress Notes (Signed)
Speech Language Pathology Daily Session Note  Patient Details  Name: Brad Delacruz MRN: [id number] Date of Birth: 1961-08-02  Today's Date: 05/08/2020 SLP Individual Time: 8413-2440 SLP Individual Time Calculation (min): 43 min  Short Term Goals: Week 3: SLP Short Term Goal 1 (Week 3): Patient will demonstrate sustained attention to functional tasks for 15 minutes with Min verbal cues for redirection. SLP Short Term Goal 2 (Week 3): Patient will complete basic, familiar tasks with mod assist for functional problem solving. SLP Short Term Goal 3 (Week 3): Patient will vocalize on command in 50% of opportunites with Max A multimodal cues. SLP Short Term Goal 4 (Week 3): Patient will utilize gestures to answer basic yes/no questions in 75% of opportunities with Min A verbal and visual cues. SLP Short Term Goal 5 (Week 3): Patient will consume current diet with minimal overt s/s of aspiration Mod I for use of swallowing compensatory strategies.  Skilled Therapeutic Interventions:  Pt was seen for skilled ST targeting goals for communication.  Pt was able to identify object when named from a field of two with mod assist multimodal cues to recognize and correct errors.  Pt was able to name 3 out of 5 familiar objects (pen/pluma, cup/vaso, and ball/bola) with max assist multimodal cues following multiple repetitions and "chunking" of words into smaller more manageable units.  SLP attempted to elicit more functional verbal communication through common phrases and the names of pt's daughters (I love you/Te amo, Brad Delacruz) but pt became perseverative and tearful on "Brad Delacruz" and was unable to be redirected.  Pt traced his name with total faded to max assist multimodal for 2 out of 3 letters (L and O).  Pt was left in bed with family at bedside and bed alarm set.  Continue per current plan of care.    Pain Pain Assessment Pain Scale: 0-10 Pain Score: 0-No pain  Therapy/Group: Individual  Therapy  Brad Delacruz, [name] 05/08/2020, 3:53 PM

## 2020-05-08 NOTE — Progress Notes (Addendum)
[place] PHYSICAL MEDICINE & REHABILITATION PROGRESS NOTE   Subjective/Complaints:  No issues overnight, shoulder pain a little better,  Wife notes pt rubbing eyes L>R   ROS: limited due to language/communication    Objective:   No results found. No results for input(s): WBC, HGB, HCT, PLT in the last 72 hours. No results for input(s): NA, K, CL, CO2, GLUCOSE, BUN, CREATININE, CALCIUM in the last 72 hours.  Intake/Output Summary (Last 24 hours) at 05/08/2020 0915 Last data filed at 05/08/2020 0550 Gross per 24 hour  Intake --  Output 675 ml  Net -675 ml     Physical Exam: Vital Signs Blood pressure 130/61, pulse 66, temperature 98 F (36.7 C), temperature source Oral, resp. rate 16, height 5\' 5"  (1.651 m), weight 75.6 kg, SpO2 97 %.  General: No acute distress Mood and affect are appropriate Heart: Regular rate and rhythm no rubs murmurs or extra sounds Lungs: Clear to auscultation, breathing unlabored, no rales or wheezes Abdomen: Positive bowel sounds, soft nontender to palpation, nondistended Extremities: No clubbing, cyanosis, or edema Skin: No evidence of breakdown, no evidence of rash Eyes non injected , no d/c 0/5 RUE and RLE  Musculoskeletal: mild to moderate right shoulder pain with PROM in ER/iR or with ABD, wearing sling. 1/4" sublux  Assessment/Plan: 1. Functional deficits secondary to Left MCA with R HP and Aphasia  which require 3+ hours per day of interdisciplinary therapy in a comprehensive inpatient rehab setting.  Physiatrist is providing close team supervision and 24 hour management of active medical problems listed below.  Physiatrist and rehab team continue to assess barriers to discharge/monitor patient progress toward functional and medical goals  Care Tool:  Bathing    Body parts bathed by patient: Chest, Abdomen, Front perineal area, Right upper leg, Left upper leg, Face, Buttocks   Body parts bathed by helper: Right arm, Left arm,  Buttocks, Right lower leg, Left lower leg Body parts n/a: Right arm, Left arm, Right lower leg, Left lower leg   Bathing assist Assist Level: Maximal Assistance - Patient 24 - 49%     Upper Body Dressing/Undressing Upper body dressing   What is the patient wearing?: Button up shirt    Upper body assist Assist Level: Moderate Assistance - Patient 50 - 74%    Lower Body Dressing/Undressing Lower body dressing      What is the patient wearing?: Pants, Incontinence brief     Lower body assist Assist for lower body dressing: Total Assistance - Patient < 25%     Toileting Toileting    Toileting assist Assist for toileting: Total Assistance - Patient < 25%     Transfers Chair/bed transfer  Transfers assist     Chair/bed transfer assist level: Moderate Assistance - Patient 50 - 74%     Locomotion Ambulation   Ambulation assist   Ambulation activity did not occur: Safety/medical concerns  Assist level: 2 helpers Assistive device: Other (comment) Max distance: 30   Walk 10 feet activity   Assist  Walk 10 feet activity did not occur: Safety/medical concerns  Assist level: 2 helpers Assistive device: Other (comment)   Walk 50 feet activity   Assist Walk 50 feet with 2 turns activity did not occur: Safety/medical concerns         Walk 150 feet activity   Assist Walk 150 feet activity did not occur: Safety/medical concerns         Walk 10 feet on uneven surface  activity  Assist Walk 10 feet on uneven surfaces activity did not occur: Safety/medical concerns         Wheelchair     Assist        Wheelchair assist level: Minimal Assistance - Patient > 75% Max wheelchair distance: 120    Wheelchair 50 feet with 2 turns activity    Assist        Assist Level: Minimal Assistance - Patient > 75%   Wheelchair 150 feet activity     Assist      Assist Level: Moderate Assistance - Patient 50 - 74%   Blood pressure  130/61, pulse 66, temperature 98 F (36.7 C), temperature source Oral, resp. rate 16, height 5\' 5"  (1.651 m), weight 75.6 kg, SpO2 97 %.  Medical Problem List and Plan: 1.  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  TEE and loop recorder placement to be discussed as outpatient.             -patient may shower             -ELOS/Goals:  MinA PT, OT, SLP Cont PT, OT, SLP D/C 6/9 2.  Antithrombotics: -DVT/anticoagulation: Subcutaneous heparin.  Venous Doppler studies negative             -antiplatelet therapy: Brilinta 90 mg twice daily, aspirin 81 mg daily 3. Pain Management: Tylenol as needed. Well controlled.    Right shoulder pain, sling for comfort- improved after R subacromial injection   5/31: -spoke with pt/family re: RUE, treatment considerations. Continue ROM as tolerated, support while seated/sleeping/standing with sling/pillows, lap tray. Lidocaine patch helps. Discussed wit hPT, now that pt is in regular WC can use hemi tray or arm trough  4. Mood: Amantadine 100 mg twice daily             -antipsychotic agents: N/A 5. Neuropsych: This patient is not capable of making decisions on his own behalf. 6. Skin/Wound Care: Routine skin checks 7. Fluids/Electrolytes/Nutrition: Routine in and outs with follow-up chemistries 8.  Dysphagia.  Dysphagia #1 honey thick liquids.   Dietary follow-up as well as speech therapy 9.  Diabetes mellitus.  Hemoglobin A1c 9.2.  NovoLog 5 units 2 times daily, Lantus insulin 35 units twice daily. Uncontrolled. CBG (last 3)  Recent Labs    05/07/20 1758 05/07/20 2115 05/08/20 0606  GLUCAP 177* 155* 92  CBGs elevated due to steroid,improving  Controlled 6/4 10.  Hypertension.  Cozaar 50 mg daily.   Vitals:   05/07/20 2019 05/08/20 0412  BP: 139/69 130/61  Pulse: 66 66  Resp: 18 16  Temp: 99 F (37.2 C) 98 F (36.7 C)  SpO2: 98% 97%    6/4 controlled   11.  Hyperlipidemia.  Lipitor 12.  Obesity.  BMI 29.50.  Dietary follow-up  13.  Post stroke shoulder pain - discussed with OT, add Kpad and aspercreme, tylenol for pain , no imaging needed at this time no trauma    xray R shoulder shows AC jt arthritis  corticosteroid injection R shoulder performed 5/26 , greater pain free range      14. Urine retention--volumes variable  -urecholine trial 10mg  tid  Flomax 0.4mg    5/31 volumes OK.  15.  Eye irritation likely xeropthalmia natural tears   LOS: 22 days A FACE TO FACE EVALUATION WAS PERFORMED  05/08/2020, 9:15 AM

## 2020-05-09 ENCOUNTER — Inpatient Hospital Stay (HOSPITAL_COMMUNITY): Payer: BC Managed Care – PPO | Admitting: Occupational Therapy

## 2020-05-09 DIAGNOSIS — E1165 Type 2 diabetes mellitus with hyperglycemia: Secondary | ICD-10-CM

## 2020-05-09 DIAGNOSIS — I10 Essential (primary) hypertension: Secondary | ICD-10-CM

## 2020-05-09 LAB — GLUCOSE, CAPILLARY
Glucose-Capillary: 108 mg/dL — ABNORMAL HIGH (ref 70–99)
Glucose-Capillary: 163 mg/dL — ABNORMAL HIGH (ref 70–99)
Glucose-Capillary: 98 mg/dL (ref 70–99)
Glucose-Capillary: 170 mg/dL — ABNORMAL HIGH (ref 70–99)

## 2020-05-09 NOTE — Progress Notes (Addendum)
[place] PHYSICAL MEDICINE & REHABILITATION PROGRESS NOTE   Subjective/Complaints: Patient seen sitting up in bed this morning eating breakfast.  Wife at bedside.  Patient indicates he slept well overnight.  No reported issues overnight.  ROS: limited due to language, but appears to deny CP, shortness of breath, nausea, vomiting, diarrhea.  Objective:   No results found. No results for input(s): WBC, HGB, HCT, PLT in the last 72 hours. No results for input(s): NA, K, CL, CO2, GLUCOSE, BUN, CREATININE, CALCIUM in the last 72 hours.  Intake/Output Summary (Last 24 hours) at 05/09/2020 1036 Last data filed at 05/09/2020 0500 Gross per 24 hour  Intake 240 ml  Output 400 ml  Net -160 ml     Physical Exam: Vital Signs Blood pressure 104/69, pulse 73, temperature 99.4 F (37.4 C), temperature source Oral, resp. rate 16, height 5\' 5"  (1.651 m), weight 75.6 kg, SpO2 96 %. Constitutional: No distress . Vital signs reviewed. HENT: Normocephalic.  Atraumatic. Eyes: EOMI. No discharge. Cardiovascular: No JVD. Respiratory: Normal effort.  No stridor. GI: Non-distended. Skin: Warm and dry.  Intact. Psych: Normal mood.  Normal behavior. Musc: No edema in extremities.  No tenderness in extremities. Neuro: Alert Motor: RUE/RLE: 0/5 proximal distal  Assessment/Plan: 1. Functional deficits secondary to Left MCA with R HP and Aphasia  which require 3+ hours per day of interdisciplinary therapy in a comprehensive inpatient rehab setting.  Physiatrist is providing close team supervision and 24 hour management of active medical problems listed below.  Physiatrist and rehab team continue to assess barriers to discharge/monitor patient progress toward functional and medical goals  Care Tool:  Bathing    Body parts bathed by patient: Chest, Abdomen, Front perineal area, Right upper leg, Left upper leg, Face, Right arm   Body parts bathed by helper: Right lower leg, Left lower leg, Buttocks,  Front perineal area Body parts n/a: Right arm, Left arm, Right lower leg, Left lower leg   Bathing assist Assist Level: Moderate Assistance - Patient 50 - 74%     Upper Body Dressing/Undressing Upper body dressing   What is the patient wearing?: Pull over shirt    Upper body assist Assist Level: Minimal Assistance - Patient > 75%    Lower Body Dressing/Undressing Lower body dressing      What is the patient wearing?: Pants, Incontinence brief     Lower body assist Assist for lower body dressing: Maximal Assistance - Patient 25 - 49%     Toileting Toileting    Toileting assist Assist for toileting: Total Assistance - Patient < 25%     Transfers Chair/bed transfer  Transfers assist     Chair/bed transfer assist level: Moderate Assistance - Patient 50 - 74%     Locomotion Ambulation   Ambulation assist   Ambulation activity did not occur: Safety/medical concerns  Assist level: 2 helpers Assistive device: Other (comment) Max distance: 30   Walk 10 feet activity   Assist  Walk 10 feet activity did not occur: Safety/medical concerns  Assist level: 2 helpers Assistive device: Other (comment)   Walk 50 feet activity   Assist Walk 50 feet with 2 turns activity did not occur: Safety/medical concerns         Walk 150 feet activity   Assist Walk 150 feet activity did not occur: Safety/medical concerns         Walk 10 feet on uneven surface  activity   Assist Walk 10 feet on uneven surfaces activity did not  occur: Safety/medical Building services engineer assist level: Minimal Assistance - Patient > 75% Max wheelchair distance: 120    Wheelchair 50 feet with 2 turns activity    Assist        Assist Level: Minimal Assistance - Patient > 75%   Wheelchair 150 feet activity     Assist      Assist Level: Moderate Assistance - Patient 50 - 74%   Blood pressure 104/69, pulse 73,  temperature 99.4 F (37.4 C), temperature source Oral, resp. rate 16, height 5\' 5"  (1.651 m), weight 75.6 kg, SpO2 96 %.  Medical Problem List and Plan: 1.  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  TEE and loop recorder placement to be discussed as outpatient.  Continue CIR   WHO/PRAFO ordered 2.  Antithrombotics: -DVT/anticoagulation: Subcutaneous heparin.  Venous Doppler studies negative             -antiplatelet therapy: Brilinta 90 mg twice daily, aspirin 81 mg daily 3. Pain Management: Tylenol as needed. Well controlled.  Right shoulder pain, sling for comfort- improved after R subacromial injection   Continue ROM as tolerated, support while seated/sleeping/standing with sling/pillows, lap tray.   Lidocaine patch helps.  Controlled on 6/5  4. Mood: Amantadine 100 mg twice daily             -antipsychotic agents: N/A 5. Neuropsych: This patient is not capable of making decisions on his own behalf. 6. Skin/Wound Care: Routine skin checks 7. Fluids/Electrolytes/Nutrition: Routine in and outs 8.  Post stroke dysphagia.  Regular diet/thins with full supervision.   Dietary follow-up as well as speech therapy 9.  Diabetes mellitus with hyperglycemia.  Hemoglobin A1c 9.2.  NovoLog 5 units 2 times daily, Lantus insulin 35 units twice daily. Uncontrolled. CBG (last 3)  Recent Labs    05/08/20 1735 05/08/20 2113 05/09/20 0612  GLUCAP 186* 205* 108*   Controlled on 6/5 10.  Hypertension.  Cozaar 50 mg daily.   Vitals:   05/08/20 2101 05/09/20 0325  BP: 119/61 104/69  Pulse: 69 73  Resp: 16 16  Temp: 98.1 F (36.7 C) 99.4 F (37.4 C)  SpO2: 96% 96%   Controlled on 6/5 11.  Hyperlipidemia.  Lipitor 12.  Obesity.  BMI 29.50.  Dietary follow-up  13.  Post stroke shoulder pain - Kpad and aspercreme, tylenol for pain , no imaging needed at this time no trauma   xray R shoulder  shows AC jt arthritis  corticosteroid injection R shoulder performed 5/26 14. Urine retention  urecholine trial 10mg  tid  Flomax 0.4mg    Improved 15.  Eye irritation likely xeropthalmia natural tears   LOS: 23 days A FACE TO FACE EVALUATION WAS PERFORMED  Margaretta Chittum 6/26 05/09/2020, 10:36 AM

## 2020-05-09 NOTE — Progress Notes (Signed)
Occupational Therapy Session Note  Patient Details  Name: Brad Delacruz MRN: [id number] Date of Birth: [birth year]  Today's Date: 05/09/2020 OT Individual Time: 1050-1205 OT Individual Time Calculation (min): 75 min   Short Term Goals: Week 3:  OT Short Term Goal 1 (Week 3): STGs=LTGs secondary to upcoming discharge  Skilled Therapeutic Interventions/Progress Updates:    Pt greeted in bed with spouse Brad Delacruz present for family education. Used the [employer] during session. Brad Delacruz had hands on practice with toilet transfers, toileting, and assisting pt with completing bathing/dressing tasks sit<stand at the sink. Mod cuing for safe setup of DME in prep for squat pivot<w/c> drop arm BSC placed over toilet. Min cuing for donning pts sling prior to mobility. Pt had continent bladder void while sitting on the toilet, reeducated spouse on timed toileting at home. Note that pt had 1 impulsive episode and tried to fully stand by himself while voiding with Brad Delacruz able to safely intervene and help him sit back down. Reviewed Rt sided protection during sit<stands at sink during bathing/dressing tasks. Min cuing for joint protection when provided [facility] assist to wash his Lt side. Discussed importance of allowing pt to participate in these tasks at max level of independence at home for functional recovery. We also discussed difference between OPOT and HHOT, recommending HHOT for f/u. Brad Delacruz in understanding that pt will sponge bathe at home until [name] helps her problem solve shower. Demonstrated tactile cuing to increase pts independence with oral care afterwards as well. Pt remained in the w/c with all needs within reach, safety belt fastened, Rt arm supported in arm sling.    Therapy Documentation Precautions:  Precautions Precautions: Fall Restrictions Weight Bearing Restrictions: No Pain: Pt premedicated with pain patch, used positioning strategies/gentle ROM to address pain during tx    ADL:        Therapy/Group: Individual Therapy  Brad Delacruz A Brad Delacruz 05/09/2020, 4:42 PM

## 2020-05-10 DIAGNOSIS — I69391 Dysphagia following cerebral infarction: Secondary | ICD-10-CM

## 2020-05-10 DIAGNOSIS — I10 Essential (primary) hypertension: Secondary | ICD-10-CM

## 2020-05-10 LAB — GLUCOSE, CAPILLARY
Glucose-Capillary: 86 mg/dL (ref 70–99)
Glucose-Capillary: 145 mg/dL — ABNORMAL HIGH (ref 70–99)
Glucose-Capillary: 208 mg/dL — ABNORMAL HIGH (ref 70–99)
Glucose-Capillary: 205 mg/dL — ABNORMAL HIGH (ref 70–99)

## 2020-05-10 NOTE — Progress Notes (Signed)
[facility] PHYSICAL MEDICINE & REHABILITATION PROGRESS NOTE   Subjective/Complaints: Patient seen sitting up in bed this morning.  No reported issues overnight.  No family at bedside this morning.  ROS: limited due to language/cognition, nods in the affirmative to all questions.  Objective:   No results found. No results for input(s): WBC, HGB, HCT, PLT in the last 72 hours. No results for input(s): NA, K, CL, CO2, GLUCOSE, BUN, CREATININE, CALCIUM in the last 72 hours.  Intake/Output Summary (Last 24 hours) at 05/10/2020 1306 Last data filed at 05/10/2020 0449 Gross per 24 hour  Intake 118 ml  Output 400 ml  Net -282 ml     Physical Exam: Vital Signs Blood pressure 115/63, pulse 67, temperature 98.3 F (36.8 C), resp. rate 18, height 5\' 5"  (1.651 m), weight 75.6 kg, SpO2 95 %.  Constitutional: No distress . Vital signs reviewed. HENT: Normocephalic.  Atraumatic. Eyes: EOMI. No discharge. Cardiovascular: No JVD. Respiratory: Normal effort.  No stridor. GI: Non-distended. Skin: Warm and dry.  Intact. Psych: Normal mood.  Normal behavior. Musc: No edema in extremities.  No tenderness in extremities. Neuro: Alert Aphasia Motor: RUE/RLE: 0/5 proximal distal, appears unchanged  Assessment/Plan: 1. Functional deficits secondary to Left MCA with R HP and Aphasia  which require 3+ hours per day of interdisciplinary therapy in a comprehensive inpatient rehab setting.  Physiatrist is providing close team supervision and 24 hour management of active medical problems listed below.  Physiatrist and rehab team continue to assess barriers to discharge/monitor patient progress toward functional and medical goals  Care Tool:  Bathing    Body parts bathed by patient: Chest, Abdomen, Front perineal area, Right upper leg, Left upper leg, Face, Right arm   Body parts bathed by helper: Right lower leg, Left lower leg, Buttocks, Front perineal area Body parts n/a: Right arm, Left arm, Right  lower leg, Left lower leg   Bathing assist Assist Level: Moderate Assistance - Patient 50 - 74%     Upper Body Dressing/Undressing Upper body dressing   What is the patient wearing?: Pull over shirt    Upper body assist Assist Level: Minimal Assistance - Patient > 75%    Lower Body Dressing/Undressing Lower body dressing      What is the patient wearing?: Pants, Incontinence brief     Lower body assist Assist for lower body dressing: Maximal Assistance - Patient 25 - 49%     Toileting Toileting    Toileting assist Assist for toileting: Maximal Assistance - Patient 25 - 49%     Transfers Chair/bed transfer  Transfers assist     Chair/bed transfer assist level: Moderate Assistance - Patient 50 - 74%     Locomotion Ambulation   Ambulation assist   Ambulation activity did not occur: Safety/medical concerns  Assist level: 2 helpers Assistive device: Other (comment) Max distance: 30   Walk 10 feet activity   Assist  Walk 10 feet activity did not occur: Safety/medical concerns  Assist level: 2 helpers Assistive device: Other (comment)   Walk 50 feet activity   Assist Walk 50 feet with 2 turns activity did not occur: Safety/medical concerns         Walk 150 feet activity   Assist Walk 150 feet activity did not occur: Safety/medical concerns         Walk 10 feet on uneven surface  activity   Assist Walk 10 feet on uneven surfaces activity did not occur: Safety/medical concerns  Wheelchair     Assist        Wheelchair assist level: Minimal Assistance - Patient > 75% Max wheelchair distance: 120    Wheelchair 50 feet with 2 turns activity    Assist        Assist Level: Minimal Assistance - Patient > 75%   Wheelchair 150 feet activity     Assist      Assist Level: Moderate Assistance - Patient 50 - 74%   Blood pressure 115/63, pulse 67, temperature 98.3 F (36.8 C), resp. rate 18, height 5\' 5"  (1.651  m), weight 75.6 kg, SpO2 95 %.  Medical Problem List and Plan: 1.  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  TEE and loop recorder placement to be discussed as outpatient.  Continue CIR   WHO/PRAFO ordered, pending 2.  Antithrombotics: -DVT/anticoagulation: Subcutaneous heparin.  Venous Doppler studies negative             -antiplatelet therapy: Brilinta 90 mg twice daily, aspirin 81 mg daily 3. Pain Management: Tylenol as needed.  Right shoulder pain, sling for comfort- improved after R subacromial injection   Continue ROM as tolerated, support while seated/sleeping/standing with sling/pillows, lap tray.   Lidocaine patch helps.  Appears controlled on 6/6  4. Mood: Amantadine 100 mg twice daily             -antipsychotic agents: N/A 5. Neuropsych: This patient is not capable of making decisions on his own behalf. 6. Skin/Wound Care: Routine skin checks 7. Fluids/Electrolytes/Nutrition: Routine in and outs 8.  Post stroke dysphagia.  Regular diet/thins with full supervision.   Dietary follow-up as well as speech therapy 9.  Diabetes mellitus with hyperglycemia.  Hemoglobin A1c 9.2.  NovoLog 5 units 2 times daily, Lantus insulin 35 units twice daily. Uncontrolled. CBG (last 3)  Recent Labs    05/09/20 2113 05/10/20 0609 05/10/20 1146  GLUCAP 170* 86 145*   Relatively controlled on 6/6 10.  Hypertension.  Cozaar 50 mg daily.   Vitals:   05/09/20 1939 05/10/20 0443  BP: 113/60 115/63  Pulse: 65 67  Resp: 16 18  Temp: 98.9 F (37.2 C) 98.3 F (36.8 C)  SpO2: 99% 95%   Controlled on 6/6 11.  Hyperlipidemia.  Lipitor 12.  Obesity.  BMI 29.50.  Dietary follow-up  13.  Post stroke shoulder pain - Kpad and aspercreme, tylenol for pain , no imaging needed at this time no trauma   xray R shoulder shows AC jt arthritis  corticosteroid injection R shoulder performed 5/26 14.  Urine retention  Continue urecholine trial 10mg  tid  Flomax 0.4mg    Improving 15.  Eye irritation likely xeropthalmia natural tears   LOS: 24 days A FACE TO FACE EVALUATION WAS PERFORMED  Brad Delacruz 05/10/2020, 1:06 PM

## 2020-05-10 NOTE — Plan of Care (Signed)
  Problem: RH BLADDER ELIMINATION Goal: RH STG MANAGE BLADDER WITH ASSISTANCE Description: STG Manage Bladder With min Assistance Outcome: Progressing   Problem: RH COGNITION-NURSING Goal: RH STG USES MEMORY AIDS/STRATEGIES W/ASSIST TO PROBLEM SOLVE Description: STG Uses Memory Aids/Strategies With min Assistance to Problem Solve. Outcome: Progressing

## 2020-05-11 ENCOUNTER — Inpatient Hospital Stay (HOSPITAL_COMMUNITY): Payer: BC Managed Care – PPO

## 2020-05-11 ENCOUNTER — Inpatient Hospital Stay (HOSPITAL_COMMUNITY): Payer: BC Managed Care – PPO | Admitting: Physical Therapy

## 2020-05-11 ENCOUNTER — Inpatient Hospital Stay (HOSPITAL_COMMUNITY): Payer: BC Managed Care – PPO | Admitting: Occupational Therapy

## 2020-05-11 LAB — GLUCOSE, CAPILLARY
Glucose-Capillary: 105 mg/dL — ABNORMAL HIGH (ref 70–99)
Glucose-Capillary: 134 mg/dL — ABNORMAL HIGH (ref 70–99)
Glucose-Capillary: 117 mg/dL — ABNORMAL HIGH (ref 70–99)
Glucose-Capillary: 160 mg/dL — ABNORMAL HIGH (ref 70–99)

## 2020-05-11 NOTE — Progress Notes (Signed)
Patient ID: Brad Delacruz, male   DOB: 25-Sep-1961, 59 y.o.   MRN: [id number]   Patient declined by [facility] and [facility] due to patient need.

## 2020-05-11 NOTE — Progress Notes (Signed)
Physical Therapy Session Note  Patient Details  Name: Jonerik Sliker MRN: [id number] Date of Birth: September 09, 1961  Today's Date: 05/11/2020 PT Individual Time: 1300-1415 PT Individual Time Calculation (min): 75 min   Short Term Goals: Week 4:  PT Short Term Goal 1 (Week 4): STG=LTG due to ELOS  Skilled Therapeutic Interventions/Progress Updates: Pt presented in w/c with wife and interpreter present and agreeable to therapy. Session focused on continued hand on education for transfers and w/c mobility. Pt propelled out of room and down hallway with intermittent min A and PTA cueing wife regarding hemi technique and maintaining safe placement of RLE on leg rest. PTA transported remaining distance to day room. PTA had wife set up w/c in preparation for squat pivot transfer however required mod verbal cues for each step including locking breaks, removing leg rests, and appropriately setting up pt for transfer. Wife was able to transfer pt to L and R with mod/maxA however required cues to safely set up pt for transfer. Pt was then transported to ortho gym and set up for car transfer. PTA advised pt that real car transfer will have less space to maneuver (car door will not open as wide). Wife required assist for SB set up as well as verbal cues to safely transfer pt. Wife also required cues for safe set up to perform transfer back to w/c. Pt noted to have increased restlessness. PTA asked wife when last toileted to which wife advised approx 9am. Per wife asked pt if needed to use bathroom earlier but pt stated "no". Advised that pt's behavior may indicate need for toileting. Transported pt back to room and wife was able to successfully demonstrate squat pivot transfer to toilet with PTA assisting with clothing management with decreased cues. Pt was able to successfully void at toilet. Wife was able to perform stand pivot back to w/c with PTA providing assist for clothing management as wife supported pt. Once in  chair wife indicated needed to leave for lunch and PTA transported pt to day room and performed squat pivot modA to NuStep. Pt participated in NuStep L2 x 4 min with BLE and LUE. Pt then performed with BLE only with PTA providing minA for RLE management for 1 additional minute. Pt returned to w/c via squat pivot modA and transported back to room. Pt then performed squat pivot to bed modA and was able to performed sit to long sit in bed with minA and heavy use of bed features. Pt was then repositioned to comfort and left with bed alarm on, call bell within reach and current needs met.      Therapy Documentation Precautions:  Precautions Precautions: Fall Restrictions Weight Bearing Restrictions: No General:   Vital Signs: Therapy Vitals Temp: (!) 97.4 F (36.3 C) Pulse Rate: 61 Resp: (!) 24 BP: 113/63 Patient Position (if appropriate): Lying Oxygen Therapy SpO2: 96 % O2 Device: Room Air Pain:   Mobility:   Locomotion :    Trunk/Postural Assessment :    Balance:   Exercises:   Other Treatments:      Therapy/Group: Individual Therapy  Violett Hobbs 05/11/2020, 4:10 PM

## 2020-05-11 NOTE — Progress Notes (Signed)
[facility] PHYSICAL MEDICINE & REHABILITATION PROGRESS NOTE   Subjective/Complaints:   No issues per family, discussed CBG, as well as RUE management, interpreter at bedside   ROS: limited due to language/cognition, nods in the affirmative to all questions.  Objective:   No results found. No results for input(s): WBC, HGB, HCT, PLT in the last 72 hours. No results for input(s): NA, K, CL, CO2, GLUCOSE, BUN, CREATININE, CALCIUM in the last 72 hours.  Intake/Output Summary (Last 24 hours) at 05/11/2020 1003 Last data filed at 05/11/2020 0532 Gross per 24 hour  Intake 240 ml  Output 825 ml  Net -585 ml     Physical Exam: Vital Signs Blood pressure 119/69, pulse 63, temperature 97.6 F (36.4 C), temperature source Oral, resp. rate 16, height 5\' 5"  (1.651 m), weight 75.6 kg, SpO2 96 %.   General: No acute distress Mood and affect are appropriate Heart: Regular rate and rhythm no rubs murmurs or extra sounds Lungs: Clear to auscultation, breathing unlabored, no rales or wheezes Abdomen: Positive bowel sounds, soft nontender to palpation, nondistended Extremities: No clubbing, cyanosis, or edema Skin: No evidence of breakdown, no evidence of rash  Motor: RUE/RLE: 0/5 proximal distal, appears unchanged  Assessment/Plan: 1. Functional deficits secondary to Left MCA with R HP and Aphasia  which require 3+ hours per day of interdisciplinary therapy in a comprehensive inpatient rehab setting.  Physiatrist is providing close team supervision and 24 hour management of active medical problems listed below.  Physiatrist and rehab team continue to assess barriers to discharge/monitor patient progress toward functional and medical goals  Care Tool:  Bathing    Body parts bathed by patient: Chest, Abdomen, Front perineal area, Right upper leg, Left upper leg, Face, Right arm   Body parts bathed by helper: Right lower leg, Left lower leg, Buttocks, Front perineal area Body parts n/a:  Right arm, Left arm, Right lower leg, Left lower leg   Bathing assist Assist Level: Moderate Assistance - Patient 50 - 74%     Upper Body Dressing/Undressing Upper body dressing   What is the patient wearing?: Pull over shirt    Upper body assist Assist Level: Minimal Assistance - Patient > 75%    Lower Body Dressing/Undressing Lower body dressing      What is the patient wearing?: Pants, Incontinence brief     Lower body assist Assist for lower body dressing: Maximal Assistance - Patient 25 - 49%     Toileting Toileting    Toileting assist Assist for toileting: Maximal Assistance - Patient 25 - 49%     Transfers Chair/bed transfer  Transfers assist     Chair/bed transfer assist level: Moderate Assistance - Patient 50 - 74%     Locomotion Ambulation   Ambulation assist   Ambulation activity did not occur: Safety/medical concerns  Assist level: 2 helpers Assistive device: Other (comment) Max distance: 30   Walk 10 feet activity   Assist  Walk 10 feet activity did not occur: Safety/medical concerns  Assist level: 2 helpers Assistive device: Other (comment)   Walk 50 feet activity   Assist Walk 50 feet with 2 turns activity did not occur: Safety/medical concerns         Walk 150 feet activity   Assist Walk 150 feet activity did not occur: Safety/medical concerns         Walk 10 feet on uneven surface  activity   Assist Walk 10 feet on uneven surfaces activity did not occur: Safety/medical concerns  Wheelchair     Assist        Wheelchair assist level: Minimal Assistance - Patient > 75% Max wheelchair distance: 120    Wheelchair 50 feet with 2 turns activity    Assist        Assist Level: Minimal Assistance - Patient > 75%   Wheelchair 150 feet activity     Assist      Assist Level: Moderate Assistance - Patient 50 - 74%   Blood pressure 119/69, pulse 63, temperature 97.6 F (36.4 C),  temperature source Oral, resp. rate 16, height 5\' 5"  (1.651 m), weight 75.6 kg, SpO2 96 %.  Medical Problem List and Plan: 1.  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  TEE and loop recorder placement to be discussed as outpatient.  Continue CIR   WHO/PRAFO ordered, pending 2.  Antithrombotics: -DVT/anticoagulation: Subcutaneous heparin.  Venous Doppler studies negative             -antiplatelet therapy: Brilinta 90 mg twice daily, aspirin 81 mg daily 3. Pain Management: Tylenol as needed.  Right shoulder pain, sling for comfort- improved after R subacromial injection   Continue ROM as tolerated, support while seated/sleeping/standing with sling/pillows, lap tray.   Lidocaine patch helps.  Appears controlled on 6/7  4. Mood: Amantadine 100 mg twice daily             -antipsychotic agents: N/A 5. Neuropsych: This patient is not capable of making decisions on his own behalf. 6. Skin/Wound Care: Routine skin checks 7. Fluids/Electrolytes/Nutrition: Routine in and outs 8.  Post stroke dysphagia.  Regular diet/thins with full supervision.   Dietary follow-up as well as speech therapy 9.  Diabetes mellitus with hyperglycemia.  Hemoglobin A1c 9.2.  NovoLog 5 units 2 times daily, Lantus insulin 35 units twice daily.  CBG (last 3)  Recent Labs    05/10/20 1643 05/10/20 2105 05/11/20 0617  GLUCAP 208* 205* 105*   Relatively controlled on 6/7 10.  Hypertension.  Cozaar 50 mg daily.   Vitals:   05/10/20 1950 05/11/20 0533  BP: 105/64 119/69  Pulse: 71 63  Resp: 16 16  Temp: (!) 97.4 F (36.3 C) 97.6 F (36.4 C)  SpO2: 96% 96%   Controlled on 6/7 11.  Hyperlipidemia.  Lipitor 12.  Obesity.  BMI 29.50.  Dietary follow-up  13.  Post stroke shoulder pain - Kpad and aspercreme, tylenol for pain , no imaging needed at this time no trauma   xray R shoulder shows AC jt  arthritis  corticosteroid injection R shoulder performed 5/26 14. Urine retention Resolved   Continue urecholine trial 10mg  tid, will d/c  Flomax 0.4mg  - cont  Improving 15.  Eye irritation likely xeropthalmia natural tears- sclera clear   LOS: 25 days A FACE TO FACE EVALUATION WAS PERFORMED  Brad Delacruz 05/11/2020, 10:03 AM

## 2020-05-11 NOTE — Progress Notes (Signed)
Occupational Therapy Session Note  Patient Details  Name: Brad Delacruz MRN: [id number] Date of Birth: 1961-01-13  Today's Date: 05/11/2020 OT Individual Time: 2229-7989 OT Individual Time Calculation (min): 58 min   Skilled Therapeutic Interventions/Progress Updates:     Pt greeted in bed with family present for family education, interpretor, and SLP. SLP finishing session, stating pt was expressing that he Rt LE was hurting. Pt noted to be rubbing Rt LE and repositioning hips in bed, squirmy and using bedrail to sit up. When bedrail was lowered, pt initiated transition to EOB. Due to impulsivity with movement, OT assisted pt with squat pivot<w/c after daughter Verlee Monte was instructed on how to set up the w/c. Mod A for squat pivot<drop arm BSC over toilet with OT demonstrating transfer and reeducating family on technique. Pt very impulsive and needed cuing to wait for therapist before attempting to transfer. He had continent B+B void. Spouse assisted him with perihygiene (while he leaned forward on toilet) and also cued him to sit when he'd impulsively initiate standing. Spouse assisted with clothing mgt in standing and also with squat pivot>w/c after, going towards Rt side with pt partially off of the w/c post transfer. Spouse needed assistance from OT to problem solve how to cue pt to reposition himself correctly and for facilitating forward weight shifts when scooting hips back in chair. Advised spouse to have a 2nd person helping her at home during toileting secondary to pt impulsivity and increased difficultly with transferring towards the Rt side. She verbalized understanding. Due to pt urinating on the floor, LB bathing completed w/c level at sink, as well as UB/LB dressing sit<stand. Demonstrated to dtr and spouse how to facilitate increased pt independence during these tasks. Dora required cues to assist pt with standing, emphasizing blocking Rt knee and protecting Rt UE. Min A for donning  overhead shirt. Discussed recommendation for HHOT vs OPOT with dtr in agreement of HHOT. Left pt in his w/c with all needs within reach, arm sling donned, and safety belt fastened.   Therapy Documentation Precautions:  Precautions Precautions: Fall Restrictions Weight Bearing Restrictions: No Pain: RN provided pt with pain medicine during session Pain Assessment Pain Score: 4  ADL:     Therapy/Group: Individual Therapy  Analeese Andreatta A Shya Kovatch 05/11/2020, 2:18 PM

## 2020-05-11 NOTE — Progress Notes (Signed)
Orthopedic Tech Progress Note Patient Details:  Brad Delacruz [birth year] [id number]  Patient ID: Brad Delacruz, male   DOB: 06/18/1961, 59 y.o.   MRN: [id number] Called in order to hanger  Trinna Post 05/11/2020, 5:10 AM

## 2020-05-11 NOTE — Discharge Summary (Signed)
Physician Discharge Summary  Patient ID: Brad Delacruz MRN: [id number] DOB/AGE: 1961/08/09 59 y.o.  Admit date: 04/16/2020 Discharge date: 05/13/2020  Discharge Diagnoses:  Principal Problem:   Left middle cerebral artery stroke [facility]) Active Problems:   Benign essential HTN   Dysphagia, post-stroke   Essential hypertension DVT prophylaxis Diabetes mellitus Hyperlipidemia Obesity Urinary retention-resolved  Discharged Condition: Stable  Significant Diagnostic Studies: DG Shoulder Right  Result Date: 04/27/2020 CLINICAL DATA:  Right shoulder pain and weakness. CVA. EXAM: RIGHT SHOULDER - 2+ VIEW COMPARISON:  None. FINDINGS: AP and Y-views are submitted. The mineralization and alignment are normal. There is no evidence of acute fracture or dislocation. The subacromial space is preserved. There are mild acromioclavicular degenerative changes. IMPRESSION: No acute osseous findings. Mild acromioclavicular degenerative changes. Electronically Signed   By: Carey Bullocks M.D.   On: 04/27/2020 09:50   DG Abd 1 View  Result Date: 04/19/2020 CLINICAL DATA:  Right-sided abdominal pain EXAM: ABDOMEN - 1 VIEW COMPARISON:  04/15/2020 FINDINGS: Feeding catheter is been removed in the interval. Scattered large and small bowel gas is noted. No obstructive changes are seen. No free air is noted. No bony abnormality is seen. IMPRESSION: No acute abnormality noted. Electronically Signed   By: Alcide Clever M.D.   On: 04/19/2020 11:48   DG Abd 1 View  Result Date: 04/15/2020 CLINICAL DATA:  Abdominal pain x1 day. EXAM: ABDOMEN - 1 VIEW COMPARISON:  Apr 10, 2020 FINDINGS: A nasogastric tube is seen with its distal tip overlying the expected region of the duodenal bulb. The bowel gas pattern is normal. No radio-opaque calculi or other significant radiographic abnormality are seen. IMPRESSION: Nasogastric tube positioning, as described above. Electronically Signed   By: Aram Candela M.D.   On:  04/15/2020 19:33   US Abdomen Limited RUQ  Result Date: 04/21/2020 CLINICAL DATA:  Abdominal pain. EXAM: ULTRASOUND ABDOMEN LIMITED RIGHT UPPER QUADRANT COMPARISON:  None. FINDINGS: Gallbladder: No gallstones or wall thickening visualized (2.5 mm). No sonographic Murphy sign noted by sonographer. Common bile duct: Diameter: 4.2 mm Liver: No focal lesion identified. There is diffusely increased echogenicity of the liver parenchyma. Portal vein is patent on color Doppler imaging with normal direction of blood flow towards the liver. Other: None. IMPRESSION: Fatty liver. Electronically Signed   By: Aram Candela M.D.   On: 04/21/2020 17:56    Labs:  Basic Metabolic Panel: No results for input(s): NA, K, CL, CO2, GLUCOSE, BUN, CREATININE, CALCIUM, MG, PHOS in the last 168 hours.  CBC: No results for input(s): WBC, NEUTROABS, HGB, HCT, MCV, PLT in the last 168 hours.  CBG: Recent Labs  Lab 05/11/20 2006 05/12/20 5427 05/12/20 1122 05/12/20 1705 05/12/20 2058  GLUCAP 160* 87 180* 155* 177*   Family history.  Mother and father with hypertension as well as hyperlipidemia.  Denies any diabetes mellitus colon cancer esophageal cancer  Brief HPI:   Brad Delacruz is a 59 y.o. right-handed male with history of diabetes mellitus and hypertension.  Per chart review lives with spouse independent prior to admission.  Presented 03/24/2020 with right-sided weakness and aphasia to [facility].  Admission chemistries alcohol negative sodium 130 potassium 3.3 glucose 309 hemoglobin A1c 9.2 urine drug screen positive benzos.  Cranial CT scan negative for acute changes.  Remote infarct of the left corona radiata internal capsule.  CT cerebral perfusion scan as well as CT angiogram of head and neck showed a large left MCA territory nonhemorrhagic infarct as well as acute left M1  large vessel occlusion.  Patient was transferred to [facility].  Patient underwent TPA followed by mechanical  thrombectomy revascularization per interventional radiology.  Echocardiogram with ejection fraction of 60% grade 1 diastolic dysfunction.  TCD bubble study positive for small PFO and lower extremity Dopplers negative for DVT.  Patient remained intubated through 04/01/2020.  Latest follow-up cranial CT scan 04/08/2020 showed evolving large left MCA territory infarct with similar mass-effect no new hemorrhage or hydrocephalus.  Maintained on low-dose aspirin as well as Brilinta for CVA prophylaxis.  Considerations to be made for TEE and loop recorder as outpatient.  Subcutaneous heparin for DVT prophylaxis.  Nasogastric tube for nutritional support diet slowly advanced.  He did complete a course of Ancef 04/05/2020 for pneumonia.  Patient was admitted for a comprehensive rehab program   Hospital Course: Jonathin Heinicke was admitted to rehab 04/16/2020 for inpatient therapies to consist of PT, ST and OT at least three hours five days a week. Past admission physiatrist, therapy team and rehab RN have worked together to provide customized collaborative inpatient rehab.  Pertaining to patient's left MCA infarct due to left M1 occlusion status post TPA and stenting revascularization.  Patient would follow-up outpatient neurology services discussion to be made for TEE loop recorder.  Patient remained on aspirin as well as Brilinta for CVA prophylaxis.  Pain managed use of Tylenol as well as Lidoderm patch.  Mood stabilization with amantadine and good results.  Diet was slowly advanced to regular consistency.  Blood sugars controlled hemoglobin A1c 9.2 insulin therapy as directed and follow-up outpatient.  Blood pressures controlled on Cozaar.  Lipitor ongoing for hyperlipidemia.  Morbid obesity BMI 29.50 dietary follow-up.  Bouts of urinary retention improved Urecholine discontinued he did remain on low-dose Flomax no dysuria.   Blood pressures were monitored on TID basis and controlled  Diabetes has been monitored with  ac/hs CBG checks and SSI was use prn for tighter BS control.    Rehab course: During patient's stay in rehab weekly team conferences were held to monitor patient's progress, set goals and discuss barriers to discharge. At admission, patient required max assist sit to stand total assist side-lying to sitting +2 for physical assist supine to sit.  Total assist lower body dressing total assist toileting  Physical exam.  Blood pressure 128/70 pulse 80 temperature 98 respirations 18 oxygen saturations 92% room air General.  No acute distress sitting up in bed HEENT Head.  Normocephalic and atraumatic Neck.  Supple nontender no JVD without thyromegaly Cardiac regular rate rhythm without any extra sounds or murmur heard Abdomen.  Soft nontender positive bowel sounds without rebound Respiratory effort normal no respiratory distress without wheeze Neurological/musculoskeletal.  Global aphasia expressive worse than receptive follows commands mild right-sided facial droop right side neglect. Left upper extremity 4/5, left lower extremity 3/5, right side 0/5 throughout  He/  has had improvement in activity tolerance, balance, postural control as well as ability to compensate for deficits. He/ has had improvement in functional use RUE/LUE  and RLE/LLE as well as improvement in awareness.  Patient minimal assist to propel his wheelchair.  Reciprocal marching on a [place] with 2-minute rest breaks sit to stand with moderate assist.  Moderate assist overall from physical therapy with max cues for set up and positioning of the right foot and right side pelvis.  Ongoing assistance and family teaching with his wife.  Squat pivot right with moderate assist from PT and minimal cues for upper extremity placements.  Family teaching ongoing  for sponge baths.  Patient is currently mod max assist for tasks due to aphasia cognitive impairments and dysphagia.  Full family teaching completed plan discharge to home        Disposition: Discharge to home    Diet: Regular  Special Instructions: No driving smoking or alcohol  Medications at discharge 1.  Tylenol as needed 2.  Amantadine 100 mg p.o. twice daily 3.  Aspirin 81 mg p.o. daily 4.  Lipitor 80 mg p.o. daily 5.  Lantus insulin 34 units twice daily 6.  Lidoderm patch change as directed 7.  Cozaar 12.5 mg p.o. daily 8.  Flomax 0.4 mg daily after supper 9.  Brilinta 90 mg p.o. twice daily 10.  Tramadol 50 mg every 6 hours as needed pain  30-35 minutes were spent completing discharge summary and discharge planning   Discharge Instructions    Ambulatory referral to Neurology   Complete by: As directed    An appointment is requested in approximately 4 weeks left MCA infarction   Ambulatory referral to Physical Medicine Rehab   Complete by: As directed    Moderate complexity follow-up 1 to 2 weeks left MCA infarction      Follow-up Information    Kirsteins, Luanna Salk, MD Follow up.   Specialty: Physical Medicine and Rehabilitation Why: Office to call for appointment Contact information: [address] [place] [address] [phone]           Signed: Lavon Paganini [place] 05/13/2020, 5:10 AM

## 2020-05-11 NOTE — Progress Notes (Signed)
Speech Language Pathology Daily Session Note  Patient Details  Name: Brad Delacruz MRN: [id number] Date of Birth: Jun 06, 1961  Today's Date: 05/11/2020 SLP Individual Time: 0805-0900 SLP Individual Time Calculation (min): 55 min  Short Term Goals: Week 4: SLP Short Term Goal 1 (Week 4): STG=LTG due to remaining length of stay  Skilled Therapeutic Interventions: Skilled ST services focused on education and language skills. Pt's interpretor, daughter and wife were present. SLP facilitated naming of common objects, pt demonstrated ability to name 8 out 9 objects in spanish with max A repetition cues, and pt demonstrated ability to repeat at phrase level based on object function ( example: throw ball) in 6 out 9 opportunities with max A verbal cues. Pt was able to identify common objects in a field of 2 in 7 out 9 opportunities. Pt expressed x4 intelligible phrase with increase verbal error awareness. Pt also demonstrated circumlocution x3 when attempting to name objects and repeat phrases. SLP provided education to family pertaining to characteristics of aphasia and with levels of cues to provide to aid in word finding. Pt expressed discomfort eventual stating "pain of..." and was eventually able to indicate right leg pain with max A visual/verbal cues. Pt began tor cry. SLP provided comfort with repositioning in bed and notified incoming OT as well as nurse. Pt was left in room with  OT and family.ST recommends to continue skilled ST services.      Pain Pain Assessment Pain Scale: Faces Faces Pain Scale: Hurts even more Pain Type: Acute pain Pain Location: Leg Pain Orientation: Right Pain Radiating Towards: arm Pain Descriptors / Indicators: Aching;Discomfort(pt began to cry) Pain Frequency: Constant Pain Onset: On-going Pain Intervention(s): RN made aware  Therapy/Group: Individual Therapy  Maire Govan  [facility] 05/11/2020, 9:44 AM

## 2020-05-12 ENCOUNTER — Inpatient Hospital Stay (HOSPITAL_COMMUNITY): Payer: BC Managed Care – PPO | Admitting: Occupational Therapy

## 2020-05-12 ENCOUNTER — Inpatient Hospital Stay (HOSPITAL_COMMUNITY): Payer: BC Managed Care – PPO | Admitting: Physical Therapy

## 2020-05-12 ENCOUNTER — Inpatient Hospital Stay (HOSPITAL_COMMUNITY): Payer: BC Managed Care – PPO

## 2020-05-12 LAB — GLUCOSE, CAPILLARY
Glucose-Capillary: 87 mg/dL (ref 70–99)
Glucose-Capillary: 180 mg/dL — ABNORMAL HIGH (ref 70–99)
Glucose-Capillary: 155 mg/dL — ABNORMAL HIGH (ref 70–99)
Glucose-Capillary: 177 mg/dL — ABNORMAL HIGH (ref 70–99)

## 2020-05-12 MED ORDER — INSULIN GLARGINE 100 UNIT/ML SOLOSTAR PEN: 34.0000 [IU] | PEN_INJECTOR | Freq: Two times a day (BID) | SUBCUTANEOUS | 11 refills | Status: AC

## 2020-05-12 MED ORDER — TICAGRELOR 90 MG PO TABS: 90.0000 mg | ORAL_TABLET | Freq: Two times a day (BID) | ORAL | 1 refills | Status: AC

## 2020-05-12 MED ORDER — ATORVASTATIN CALCIUM 80 MG PO TABS: 80.0000 mg | ORAL_TABLET | Freq: Every day | ORAL | 0 refills | Status: AC

## 2020-05-12 MED ORDER — TRAMADOL HCL 50 MG PO TABS: 50.0000 mg | ORAL_TABLET | Freq: Four times a day (QID) | ORAL | 0 refills | Status: AC | PRN

## 2020-05-12 MED ORDER — INSULIN STARTER KIT- PEN NEEDLES (SPANISH)
1.0000 | Freq: Once | Status: DC
  Filled 2020-05-12: qty 1

## 2020-05-12 MED ORDER — AMANTADINE HCL 50 MG/5ML PO SYRP: 100.0000 mg | ORAL_SOLUTION | Freq: Two times a day (BID) | ORAL | 0 refills | Status: DC

## 2020-05-12 MED ORDER — LOSARTAN POTASSIUM 25 MG PO TABS: 12.5000 mg | ORAL_TABLET | Freq: Every day | ORAL | 1 refills | Status: AC

## 2020-05-12 MED ORDER — LIDOCAINE 5 % EX PTCH: 1.0000 | MEDICATED_PATCH | CUTANEOUS | 0 refills | Status: DC

## 2020-05-12 MED ORDER — ACETAMINOPHEN 325 MG PO TABS: 650.0000 mg | ORAL_TABLET | Freq: Four times a day (QID) | ORAL | Status: AC | PRN

## 2020-05-12 MED ORDER — TAMSULOSIN HCL 0.4 MG PO CAPS: 0.4000 mg | ORAL_CAPSULE | Freq: Every day | ORAL | 0 refills | Status: AC

## 2020-05-12 NOTE — Progress Notes (Signed)
Patient ID: Brad Delacruz, male   DOB: [birth year], 59 y.o.   MRN: [id number]   Patient daughter informed SW that medical transport is not cost efficient for them currently, would like further recommendations from therapy.

## 2020-05-12 NOTE — Progress Notes (Signed)
Patient ID: Brad Delacruz, male   DOB: 1961/05/14, 59 y.o.   MRN: [id number]  SW provided medical transportation information to family, due to therapy deeming patient unsafe for car tranfers.  1. [phone] 2. [facility]: [phone] 3. [place] transportation: [phone] 4. [facility]: [phone] 5. [facility]: [phone]

## 2020-05-12 NOTE — Progress Notes (Signed)
Physical Therapy Session Note  Patient Details  Name: Brad Delacruz MRN: [id number] Date of Birth: [birth year]  Today's Date: 05/12/2020 PT Individual Time: 1300-1415 PT Individual Time Calculation (min): 75 min   Short Term Goals: Week 4:  PT Short Term Goal 1 (Week 4): STG=LTG due to ELOS  Skilled Therapeutic Interventions/Progress Updates: Pt presented in w/c with wife and interpreter present agreeable to therapy. PTA suggested toileting prior to leaving room to which family was agreeable to. PTA had wife set up and perform transfer to/from toilet with modA and improved sequencing and cues compared to yesterday's session. PTA assisted with clothing management and was present for safety (+void). Once completed with toileting pt transported to [employer] to practice SB transfer to car. Wife continued to require cues for w/c placement, and removing leg rests. However she demonstrated improved safety awareness in locking breaks and positioning pt prior to transfer. Wife was also able to provide better cues in preparation for transfer but required assist for appropriate SB placement. Once transfer completed pt transferred to hallway and participated in gait at wall rail for wt bearing through RLE with pt ambulating 12 and 49f respectively with PTA advancing RLE, blocking R knee and facilitating wt shifting to R for advancement of LLE. Pt then transferred to rehab gym and performed squat pivot transfer to mat with modA to R and participated in reaching tasks outside BOS with pt demonstrating improved righting reactions with slightly moderate reaching challenges. Pt then returned to w/c to L with near [place]. Pt then participated in w/c propulsion approx 1028fwith minA and moderate verbal cues to maintain sequencing for hemoi technique. Pt was able to perform x 2 turns with increased time, effort, and minA. Pt transported remaining distance to room and performed squat pivot with modA to return to bed. Pt  returned to supine modA and positioned to comfort. Pt left with bed alarm on, call bell within reach and needs met.      Therapy Documentation Precautions:  Precautions Precautions: Fall Required Braces or Orthoses: (sling for transfers to RUE) Restrictions Weight Bearing Restrictions: No General:   Vital Signs: Therapy Vitals Temp: 98.3 F (36.8 C) Pulse Rate: 66 BP: 117/72 Oxygen Therapy SpO2: 99 % Pain: Pain Assessment Pain Score: 0-No pain Mobility:   Locomotion :    Trunk/Postural Assessment :    Balance:   Exercises:   Other Treatments:      Therapy/Group: Individual Therapy  [name] 05/12/2020, 4:29 PM

## 2020-05-12 NOTE — Progress Notes (Signed)
Physical Therapy Discharge Summary  Patient Details  Name: Aristidis Talerico MRN: [id number] Date of Birth: 1961/10/17  Today's Date: 05/12/2020   Patient has met 8 of 9 long term goals due to improved activity tolerance, improved balance, improved postural control, improved attention, improved awareness and improved coordination.  Patient to discharge at a wheelchair level [place].   Patient's care partner is independent to provide the necessary physical assistance at discharge.  Reasons goals not met: Pt continues to require min to modA with dynamic balance with moderate functional challenges which continues to improve.   Recommendation:  Patient will benefit from ongoing skilled PT services in outpatient setting to continue to advance safe functional mobility, address ongoing impairments in balance, transfers, safety, WC mobility, and minimize fall risk.  Equipment: WC, hospital bed   Reasons for discharge: treatment goals met and discharge from hospital  Patient/family agrees with progress made and goals achieved: Yes  PT Discharge Precautions/Restrictions Precautions Precautions: Fall Restrictions Weight Bearing Restrictions: No Vital Signs Therapy Vitals Temp: 98.3 F (36.8 C) Pulse Rate: 66 BP: 117/72 Oxygen Therapy SpO2: 99 % Pain Pain Assessment Pain Score: 0-No pain Vision/Perception     Cognition Overall Cognitive Status: Impaired/Different from baseline Arousal/Alertness: Awake/alert Orientation Level: Oriented to person;Oriented to place;Oriented to situation Attention: Sustained Sustained Attention: Impaired Sustained Attention Impairment: Functional basic;Verbal complex Awareness: Impaired Awareness Impairment: Emergent impairment Problem Solving: Impaired Problem Solving Impairment: Functional basic;Verbal basic Safety/Judgment: Impaired Sensation    Motor  Motor Motor: Hemiplegia;Motor perseverations;Abnormal postural alignment and  control Motor - Discharge Observations: minimal change since eval, dense RUE/RLE hemi  Mobility Bed Mobility Bed Mobility: Supine to Sit;Sit to Supine Supine to Sit: Moderate Assistance - Patient 50-74% Sit to Supine: Moderate Assistance - Patient 50-74% Transfers Transfers: Sit to Stand;Stand to Sit;Lateral/Scoot Transfers;Squat Pivot Transfers Sit to Stand: Moderate Assistance - Patient 50-74% Stand to Sit: Moderate Assistance - Patient 50-74% Squat Pivot Transfers: Moderate Assistance - Patient 50-74% Lateral/Scoot Transfers: Moderate Assistance - Patient 50-74%;Minimal Assistance - Patient > 75%;Other/comment Lateral Transfer Comment: minA to L, moderate to R with max cues for positioning and sequencing Locomotion  Gait Ambulation: Yes Gait Assistance: Maximal Assistance - Patient 25-49% Gait Distance (Feet): 19 Feet Assistive device: Other (Comment)(wall rail) Gait Assistance Details: Manual facilitation for weight shifting;Manual facilitation for placement;Verbal cues for precautions/safety;Verbal cues for sequencing;Verbal cues for gait pattern Gait Gait: No Stairs / Additional Locomotion Stairs: No Wheelchair Mobility Wheelchair Mobility: Yes Wheelchair Assistance: Minimal assistance - Patient >75% Wheelchair Propulsion: Left lower extremity;Left upper extremity Wheelchair Parts Management: Needs assistance Distance: 139f  Trunk/Postural Assessment  Cervical Assessment Cervical Assessment: Exceptions to WFL(forward head) Thoracic Assessment Thoracic Assessment: Exceptions to WFL(rounded shoulders) Lumbar Assessment Lumbar Assessment: Exceptions to WFL(posterior pelvic tilt) Postural Control Postural Control: Deficits on evaluation  Balance Balance Balance Assessed: Yes Static Sitting Balance Static Sitting - Balance Support: Left upper extremity supported;Feet supported Static Sitting - Level of Assistance: 5: Stand by assistance Dynamic Sitting Balance Dynamic  Sitting - Balance Support: Feet supported Dynamic Sitting - Level of Assistance: 5: Stand by assistance Dynamic Sitting - Balance Activities: Lateral lean/weight shifting;Forward lean/weight shifting;Reaching for objects;Reaching across midline Static Standing Balance Static Standing - Balance Support: Left upper extremity supported Static Standing - Level of Assistance: 2: Max assist Dynamic Standing Balance Dynamic Standing - Level of Assistance: 1: +1 Total assist Extremity Assessment  RUE Assessment RUE Assessment: Exceptions to [facility]ctive Range of Motion (AROM) Comments: none/flaccid General Strength Comments: 0/5 LUE Assessment LUE Assessment:  Within Functional Limits RLE Assessment RLE Assessment: Exceptions to [facility] General Strength Comments: trace hip extensor activation in wt bearing, no other activation LLE Assessment LLE Assessment: Within Functional Limits General Strength Comments: grossly 4/5    Rosita DeChalus 05/12/2020, 4:55 PM

## 2020-05-12 NOTE — Progress Notes (Signed)
[place] PHYSICAL MEDICINE & REHABILITATION PROGRESS NOTE   Subjective/Complaints:  No issues overnite, SLP with interpreter   ROS: limited due to language/cognition, nods in the affirmative to all questions.  Objective:   No results found. No results for input(s): WBC, HGB, HCT, PLT in the last 72 hours. No results for input(s): NA, K, CL, CO2, GLUCOSE, BUN, CREATININE, CALCIUM in the last 72 hours.  Intake/Output Summary (Last 24 hours) at 05/12/2020 0848 Last data filed at 05/11/2020 2226 Gross per 24 hour  Intake --  Output 425 ml  Net -425 ml     Physical Exam: Vital Signs Blood pressure 106/65, pulse 60, temperature 97.9 F (36.6 C), temperature source Oral, resp. rate 18, height 5\' 5"  (1.651 m), weight 75.6 kg, SpO2 95 %.   General: No acute distress Mood and affect are appropriate Heart: Regular rate and rhythm no rubs murmurs or extra sounds Lungs: Clear to auscultation, breathing unlabored, no rales or wheezes Abdomen: Positive bowel sounds, soft nontender to palpation, nondistended Extremities: No clubbing, cyanosis, or edema Skin: No evidence of breakdown, no evidence of rash  Motor: RUE/RLE: 0/5 proximal distal, appears unchanged  Assessment/Plan: 1. Functional deficits secondary to Left MCA with R HP and Aphasia  which require 3+ hours per day of interdisciplinary therapy in a comprehensive inpatient rehab setting.  Physiatrist is providing close team supervision and 24 hour management of active medical problems listed below.  Physiatrist and rehab team continue to assess barriers to discharge/monitor patient progress toward functional and medical goals  Care Tool:  Bathing    Body parts bathed by patient: Chest, Abdomen, Front perineal area, Right upper leg, Left upper leg, Face, Right arm   Body parts bathed by helper: Right lower leg, Left lower leg, Buttocks, Front perineal area Body parts n/a: Right arm, Left arm, Right lower leg, Left lower leg    Bathing assist Assist Level: Moderate Assistance - Patient 50 - 74%     Upper Body Dressing/Undressing Upper body dressing   What is the patient wearing?: Pull over shirt    Upper body assist Assist Level: Minimal Assistance - Patient > 75%    Lower Body Dressing/Undressing Lower body dressing      What is the patient wearing?: Pants, Incontinence brief     Lower body assist Assist for lower body dressing: Maximal Assistance - Patient 25 - 49%     Toileting Toileting    Toileting assist Assist for toileting: Maximal Assistance - Patient 25 - 49%     Transfers Chair/bed transfer  Transfers assist     Chair/bed transfer assist level: Moderate Assistance - Patient 50 - 74%     Locomotion Ambulation   Ambulation assist   Ambulation activity did not occur: Safety/medical concerns  Assist level: 2 helpers Assistive device: Other (comment) Max distance: 30   Walk 10 feet activity   Assist  Walk 10 feet activity did not occur: Safety/medical concerns  Assist level: 2 helpers Assistive device: Other (comment)   Walk 50 feet activity   Assist Walk 50 feet with 2 turns activity did not occur: Safety/medical concerns         Walk 150 feet activity   Assist Walk 150 feet activity did not occur: Safety/medical concerns         Walk 10 feet on uneven surface  activity   Assist Walk 10 feet on uneven surfaces activity did not occur: Safety/medical concerns         Wheelchair  Assist        Wheelchair assist level: Minimal Assistance - Patient > 75% Max wheelchair distance: 120    Wheelchair 50 feet with 2 turns activity    Assist        Assist Level: Minimal Assistance - Patient > 75%   Wheelchair 150 feet activity     Assist      Assist Level: Moderate Assistance - Patient 50 - 74%   Blood pressure 106/65, pulse 60, temperature 97.9 F (36.6 C), temperature source Oral, resp. rate 18, height 5\' 5"  (1.651 m),  weight 75.6 kg, SpO2 95 %.  Medical Problem List and Plan: 1.  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  TEE and loop recorder placement to be discussed as outpatient.  Continue CIR - plan d/c in am   [facility] ordered, pending 2.  Antithrombotics: -DVT/anticoagulation: Subcutaneous heparin.  Venous Doppler studies negative             -antiplatelet therapy: Brilinta 90 mg twice daily, aspirin 81 mg daily 3. Pain Management: Tylenol as needed.  Right shoulder pain, sling for comfort- improved but not resolved  after R subacromial injection   Continue ROM as tolerated, support while seated/sleeping/standing with sling/pillows, lap tray.   Lidocaine patch helps.   4. Mood: Amantadine 100 mg twice daily             -antipsychotic agents: N/A 5. Neuropsych: This patient is not capable of making decisions on his own behalf. 6. Skin/Wound Care: Routine skin checks 7. Fluids/Electrolytes/Nutrition: Routine in and outs 8.  Post stroke dysphagia.  Regular diet/thins with full supervision.   Dietary follow-up as well as speech therapy 9.  Diabetes mellitus with hyperglycemia.  Hemoglobin A1c 9.2.  NovoLog 5 units 2 times daily, Lantus insulin 35 units twice daily.  CBG (last 3)  Recent Labs    05/11/20 1731 05/11/20 2006 05/12/20 0628  GLUCAP 117* 160* 87   Relatively controlled on 6/8 10.  Hypertension.  Cozaar 50 mg daily.   Vitals:   05/11/20 1939 05/12/20 0513  BP: 114/67 106/65  Pulse: (!) 59 60  Resp: 16 18  Temp: 97.8 F (36.6 C) 97.9 F (36.6 C)  SpO2: 97% 95%   Controlled on 6/8 11.  Hyperlipidemia.  Lipitor 12.  Obesity.  BMI 29.50.  Dietary follow-up  13.  Post stroke shoulder pain - Kpad and aspercreme, tylenol for pain , no imaging needed at this time no trauma   xray R shoulder shows AC jt arthritis  corticosteroid injection R shoulder performed 5/26 14. Urine  retention Resolved   Continue urecholine trial 10mg  tid, will d/c  Flomax 0.4mg  - cont  Improving 15.  Eye irritation likely xeropthalmia natural tears- sclera clear   LOS: 26 days A FACE TO FACE EVALUATION WAS PERFORMED  Charlett Blake 05/12/2020, 8:48 AM

## 2020-05-12 NOTE — Progress Notes (Signed)
Speech Language Pathology Discharge Summary  Patient Details  Name: Brad Delacruz MRN: [id number] Date of Birth: [birth year]  Today's Date: 05/12/2020 SLP Individual Time: 0803-0900 SLP Individual Time Calculation (min): 57 min   Skilled Therapeutic Interventions:   Skilled ST services focused on education, swallow and language skills. Pt's breakfast tray had not arrived when SLP entered, SLP got tray from cart and set pt up for breakfast. Pt required supervision A verbal cues for small bites, but demonstrated appropriate oral clearance and no overt s/s aspiration. SLP provided education to family pertaining to continued ST services at OP setting due to eligible HH not having available all three therapy disciplines, requesting daughters assist with car transfer and emphasied timed toileting and clarification of inaccurate yes/no response. All questions answered to satisfaction. Pt demonstrated ability to name common objects in 5 out 5 opportunities with max A repetition and phonemic cues.Pt was able to match common objects to picture in a field of 2, in 2 out 3 opportunities with max A repetition of instruction. Pt demonstrated increase verbal error awareness stating " I cant say." Pt was left in room with call bell within reach and bed alarm set. ST recommends to continue skilled ST services.     Patient has met 9 of 9 long term goals.  Patient to discharge at overall Min;Supervision;Mod;Max level.  Reasons goals not met:     Clinical Impression/Discharge Summary:   Pt made great progress meeting 9 out 9 goals. Pt demonstrated dramatic improvement in expressive language skills in vocalizing, automatic language tasks (counting 1-10, singing happy birthday), expressing at word-phrase level, and naming common objects. Pt's verbal responses are mainly in spanish, but occasional in [place]. Pt requires max A multimodal cues for expressive language characterized by circumlocution, phonemic errors and  increased error awareness. Pt demonstrated increased sustained attention, is able to follow basic commands, basic problem solving such as self-feeding and re-creating simple patterns and identify objects from a field of 2 with 80% accuracy. Pt participated in [place] in which pt was upgraded from honey thick liquids and dys 1 to thin liquids and eventually regular textures at bedside. SLP recommends follow up ST assesses carryover of swallow strategies (small bites and oral clearances) then discharges dysphagia goal to focus on language and cognitive skills. Education was completed with family over many treatment sessions. Pt benefited from skilled ST services to maximumize functional independence and reduce burden of care, requiring 24 hour supervision and continue ST services.    Care Partner:  Caregiver Able to Provide Assistance: Yes  Type of Caregiver Assistance: Physical;Cognitive  Recommendation:  Home Health SLP;24 hour supervision/assistance;Outpatient SLP  Rationale for SLP Follow Up: Maximize functional communication;Maximize cognitive function and independence;Maximize swallowing safety;Reduce caregiver burden   Equipment: N/A   Reasons for discharge: Discharged from hospital   Patient/Family Agrees with Progress Made and Goals Achieved: Yes    Lexton Hidalgo  [facility] 05/12/2020, 4:26 PM

## 2020-05-12 NOTE — Progress Notes (Signed)
Occupational Therapy Discharge Summary  Patient Details  Name: Brad Delacruz MRN: [id number] Date of Birth: [birth year]  Today's Date: 05/12/2020 OT Individual Time: 1700-1749 OT Individual Time Calculation (min): 60 min    Patient has met 7 of 10 long term goals due to improved activity tolerance, improved balance, postural control, improved attention and improved awareness.  Patient to discharge at overall Mod Assist level.  Patient's care partner is independent to provide the necessary physical and cognitive assistance at discharge. Education has been completed with both wife and daughter(s) for ADLs, transfers, and safety techniques.   Reasons goals not met: decreased standing balance d/t R knee buckling during standing ADLs but wife has been taught/demonstrated compensatory strategies, toilet transfers are Mod A to his L side but Max A to his R (affected side), pt is also impulsive and exhibits reduced cognition during standing and transfers.  Recommendation:  Patient will benefit from ongoing skilled OT services in outpatient setting to continue to advance functional skills in the area of BADL and Reduce care partner burden.  Equipment: drop arm BSC, manual wheelchair  Reasons for discharge: discharge from hospital and progress towards goals  Skilled Interventions: Pt greeted at time of session reclined in bed, wife daughter and translator all present. All aware that the pt is to be [place] home tomorrow. Supine to sitting up at EOB with min A, wife performed squat pivot transfer bed > wheelchair and maneuvered wheelchair to the toilet, cues provided for optimal positioning to bring wheelchair closer to the toilet and close the gap but otherwise wife able to perform squat pivot to/from toilet, instruct the pt to stand to don/doff clothing, and assist in donning over hips in standing while blocking his RLE. Pt able to assist with threading pants and bringing up to thigh level for wife to  don over hips. Pt brought up to sink level, participated in oral hygiene and continues to place toothpaste in mouth first and brushes second, Mod A from therapist for thoroughness to clean R side and family ed completed. Grooming at sink level with pt able to attend to [facility] on R side of sink without cues but needed assist to orient brush in hand prior to brushing hair, but improved from previous sessions. Wheelchair and other equipment arrived during session for DC home tomorrow, therapist performed squat pivot to his new personal wheelchair to determine fit and it is too high, spoke with PTA regarding lowering it later today but otherwise a good fit. Family ed regarding transfering from high to low surfaces at home, plans for outpatient OT, and reviewed PROM techniques with wife demonstrating for the patient's RUE to decrease risk of contractures. No further questions from caregivers, pt left up in chair with alarm on, call bell in reach, and all needs met.  Patient/family agrees with progress made and goals achieved: Yes  OT Discharge Precautions/Restrictions  Precautions Precautions: Fall Required Braces or Orthoses: (sling for transfers to RUE) Restrictions Weight Bearing Restrictions: No Pain Pain Assessment Pain Scale: 0-10(unable to clearly determine d/t aphasia, mild pain from facial grimaces) Pain Score: 3 (mild pain from facial grimaces, aphasia limiting) ADL ADL Eating: Minimal cueing Grooming: Minimal assistance, Moderate cueing Where Assessed-Grooming: Sitting at sink Upper Body Bathing: Minimal assistance Where Assessed-Upper Body Bathing: Sitting at sink Lower Body Bathing: Moderate assistance Where Assessed-Lower Body Bathing: Sitting at sink, Standing at sink Upper Body Dressing: Minimal assistance Where Assessed-Upper Body Dressing: Sitting at sink Lower Body Dressing: Moderate assistance Where Assessed-Lower Body Dressing:  Sitting at sink, Standing at  sink Toileting: Maximal assistance Where Assessed-Toileting: Toilet(with drop arm BSC over toilet) Toilet Transfer: Maximal assistance(Mod to L side Max to R side) Toilet Transfer Method: Market researcher Equipment: Drop arm bedside commode Tub/Shower Transfer: Maximal assistance Tub/Shower Transfer Method: Squat pivot Motor  Motor Motor: Hemiplegia;Motor perseverations;Abnormal postural alignment and control Motor - Skilled Clinical Observations: dense RUE/RLE hemiplegia Motor - Discharge Observations: continues to have flaccid RUE Trunk/Postural Assessment  Cervical Assessment Cervical Assessment: Exceptions to WFL(FWD head posture) Thoracic Assessment Thoracic Assessment: Exceptions to WFL(slightly kyphotic but posture has improved) Lumbar Assessment Lumbar Assessment: Exceptions to WFL(posterior pelvic tilt but posture has improved)  Balance Balance Balance Assessed: Yes Static Sitting Balance Static Sitting - Balance Support: Left upper extremity supported Static Sitting - Level of Assistance: 5: Stand by assistance Dynamic Sitting Balance Dynamic Sitting - Balance Support: Feet supported Dynamic Sitting - Level of Assistance: 5: Stand by assistance Dynamic Sitting - Balance Activities: Lateral lean/weight shifting;Forward lean/weight shifting;Reaching for objects;Reaching across midline Sitting balance - Comments: improved since eval, able to forward weight shift to reach BLEs to thread socks/shoes/reach for various objects during self care Static Standing Balance Static Standing - Balance Support: Left upper extremity supported Static Standing - Level of Assistance: 2: Max assist Extremity/Trunk Assessment RUE Assessment RUE Assessment: Exceptions to [facility] Active Range of Motion (AROM) Comments: none/flaccid General Strength Comments: 0/5 LUE Assessment LUE Assessment: Within Functional Limits   Viona Gilmore 05/12/2020, 1:01 PM

## 2020-05-12 NOTE — Discharge Instructions (Signed)
Inpatient Rehab Discharge Instructions  [facility] Discharge date and time: No discharge date for patient encounter.   Activities/Precautions/ Functional Status: Activity: activity as tolerated Diet:  Wound Care: none needed Functional status:  ___ No restrictions     ___ Walk up steps independently ___ 24/7 supervision/assistance   ___ Walk up steps with assistance ___ Intermittent supervision/assistance  ___ Bathe/dress independently ___ Walk with walker     _x__ Bathe/dress with assistance ___ Walk Independently    ___ Shower independently ___ Walk with assistance    ___ Shower with assistance ___ No alcohol     ___ Return to work/school ________ COMMUNITY REFERRALS UPON DISCHARGE:   Outpatient: PT     OT    ST               Agency: [facility] Phone: [phone]              Appointment Date/Time:  Medical Equipment/Items Ordered: Hospital Bed, Wheelchair, Drop Arm Bedside Commode and transfer bench                                                 Agency/Supplier:  Special Instructions: No driving smoking or alcohol STROKE/TIA DISCHARGE INSTRUCTIONS SMOKING Cigarette smoking nearly doubles your risk of having a stroke & is the single most alterable risk factor  If you smoke or have smoked in the last 12 months, you are advised to quit smoking for your health.  Most of the excess cardiovascular risk related to smoking disappears within a year of stopping.  Ask you doctor about anti-smoking medications  [place] Quit Line: 1-800-QUIT NOW  Free Smoking Cessation Classes [phone]  CHOLESTEROL Know your levels; limit fat & cholesterol in your diet  Lipid Panel     Component Value Date/Time   CHOL 180 03/25/2020 0307   TRIG 119 03/30/2020 0418   HDL 21 (L) 03/25/2020 0307   CHOLHDL 8.6 03/25/2020 0307   VLDL UNABLE TO CALCULATE IF TRIGLYCERIDE OVER 400 mg/dL 03/25/2020 0307   LDLCALC UNABLE TO CALCULATE IF TRIGLYCERIDE OVER 400 mg/dL 03/25/2020 0307        Many patients benefit from treatment even if their cholesterol is at goal.  Goal: Total Cholesterol (CHOL) less than 160  Goal:  Triglycerides (TRIG) less than 150  Goal:  HDL greater than 40  Goal:  LDL (LDLCALC) less than 100   BLOOD PRESSURE American Stroke Association blood pressure target is less that 120/80 mm/Hg  Your discharge blood pressure is:  BP: 110/71  Monitor your blood pressure  Limit your salt and alcohol intake  Many individuals will require more than one medication for high blood pressure  DIABETES (A1c is a blood sugar average for last 3 months) Goal HGBA1c is under 7% (HBGA1c is blood sugar average for last 3 months)  Diabetes:    Lab Results  Component Value Date   HGBA1C 9.2 (H) 03/24/2020     Your HGBA1c can be lowered with medications, healthy diet, and exercise.  Check your blood sugar as directed by your physician  Call your physician if you experience unexplained or low blood sugars.  PHYSICAL ACTIVITY/REHABILITATION Goal is 30 minutes at least 4 days per week  Activity: Increase activity slowly, Therapies: Physical Therapy: Home Health Return to work:   Activity decreases your risk of heart attack and stroke  and makes your heart stronger.  It helps control your weight and blood pressure; helps you relax and can improve your mood.  Participate in a regular exercise program.  Talk with your doctor about the best form of exercise for you (dancing, walking, swimming, cycling).  DIET/WEIGHT Goal is to maintain a healthy weight  Your discharge diet is:  Diet Order            DIET - DYS 1 Room service appropriate? No; Fluid consistency: Honey Thick  Diet effective now              liquids Your height is:    Your current weight is:   Your Body Mass Index (BMI) is:     Following the type of diet specifically designed for you will help prevent another stroke.  Your goal weight range is:    Your goal Body Mass Index (BMI) is  19-24.  Healthy food habits can help reduce 3 risk factors for stroke:  High cholesterol, hypertension, and excess weight.  RESOURCES Stroke/Support Group:  Call [phone]   STROKE EDUCATION PROVIDED/REVIEWED AND GIVEN TO PATIENT Stroke warning signs and symptoms How to activate emergency medical system (call 911). Medications prescribed at discharge. Need for follow-up after discharge. Personal risk factors for stroke. Pneumonia vaccine given:  Flu vaccine given:  My questions have been answered, the writing is legible, and I understand these instructions.  I will adhere to these goals & educational materials that have been provided to me after my discharge from the hospital.      My questions have been answered and I understand these instructions. I will adhere to these goals and the provided educational materials after my discharge from the hospital.  Patient/Caregiver Signature _______________________________ Date __________  Clinician Signature _______________________________________ Date __________  Please bring this form and your medication list with you to all your follow-up doctor's appointments.

## 2020-05-13 LAB — GLUCOSE, CAPILLARY
Glucose-Capillary: 76 mg/dL (ref 70–99)
Glucose-Capillary: 139 mg/dL — ABNORMAL HIGH (ref 70–99)

## 2020-05-13 MED ORDER — BLOOD GLUCOSE MONITOR KIT: PACK | 0 refills | Status: AC

## 2020-05-13 NOTE — Plan of Care (Signed)
  Problem: Consults Goal: RH STROKE PATIENT EDUCATION Description: See Patient Education module for education specifics  Outcome: Completed/Met Goal: Nutrition Consult-if indicated Outcome: Completed/Met Goal: Diabetes Guidelines if Diabetic/Glucose > 140 Description: If diabetic or lab glucose is > 140 mg/dl - Initiate Diabetes/Hyperglycemia Guidelines & Document Interventions  Outcome: Completed/Met   Problem: RH BLADDER ELIMINATION Goal: RH STG MANAGE BLADDER WITH ASSISTANCE Description: STG Manage Bladder With min Assistance Outcome: Completed/Met   Problem: RH SAFETY Goal: RH STG ADHERE TO SAFETY PRECAUTIONS W/ASSISTANCE/DEVICE Description: STG Adhere to Safety Precautions With supervision Assistance/Device. Outcome: Completed/Met   Problem: RH COGNITION-NURSING Goal: RH STG USES MEMORY AIDS/STRATEGIES W/ASSIST TO PROBLEM SOLVE Description: STG Uses Memory Aids/Strategies With min Assistance to Problem Solve. Outcome: Completed/Met   Problem: RH KNOWLEDGE DEFICIT Goal: RH STG INCREASE KNOWLEDGE OF DIABETES Description: Pt/family will demonstrate understanding of DM management with min assist using handout/booklets in spanish and assistance with interrupter prior to DC Outcome: Completed/Met Goal: RH STG INCREASE KNOWLEDGE OF HYPERTENSION Description: Pt/family will demonstrate understanding of HTN management with min assist using handout/booklets in spanish and assistance with interrupter prior to DC Outcome: Completed/Met Goal: RH STG INCREASE KNOWLEDGE OF DYSPHAGIA/FLUID INTAKE Description: Pt/family will demonstrate understanding of dysphagia management with min assist using handout/booklets in spanish and assistance with interrupter prior to DC Outcome: Completed/Met Goal: RH STG INCREASE KNOWLEGDE OF HYPERLIPIDEMIA Description: Pt/family will demonstrate understanding of HLD management with min assist using handout/booklets in spanish and assistance with interrupter  prior to DC Outcome: Completed/Met Goal: RH STG INCREASE KNOWLEDGE OF STROKE PROPHYLAXIS Description: Pt/family will demonstrate understanding of stroke prevention with min assist using handout/booklets in spanish and assistance with interrupter prior to DC Outcome: Completed/Met

## 2020-05-13 NOTE — Progress Notes (Signed)
[place] PHYSICAL MEDICINE & REHABILITATION PROGRESS NOTE   Subjective/Complaints:   Family has learned transfers DIscussed with RPh regarding glucometer   ROS: limited due to language/cognition, nods in the affirmative to all questions.  Objective:   No results found. No results for input(s): WBC, HGB, HCT, PLT in the last 72 hours. No results for input(s): NA, K, CL, CO2, GLUCOSE, BUN, CREATININE, CALCIUM in the last 72 hours.  Intake/Output Summary (Last 24 hours) at 05/13/2020 0905 Last data filed at 05/13/2020 0551 Gross per 24 hour  Intake 240 ml  Output 1900 ml  Net -1660 ml     Physical Exam: Vital Signs Blood pressure 130/66, pulse 73, temperature 98.6 F (37 C), temperature source Oral, resp. rate 16, height 5\' 5"  (1.651 m), weight 78.6 kg, SpO2 94 %.    General: No acute distress Mood and affect are appropriate Heart: Regular rate and rhythm no rubs murmurs or extra sounds Lungs: Clear to auscultation, breathing unlabored, no rales or wheezes Abdomen: Positive bowel sounds, soft nontender to palpation, nondistended Extremities: No clubbing, cyanosis, or edema Skin: No evidence of breakdown, no evidence of rash   Motor: RUE/RLE: 0/5 proximal distal, appears unchanged  Assessment/Plan: 1. Functional deficits secondary to Left MCA with R HP and Aphasia  Stable for D/C today F/u PCP in 3-4 weeks F/u PM&R 2 weeks See D/C summary See D/C instructions Care Tool:  Bathing    Body parts bathed by patient: Chest, Abdomen, Front perineal area, Right upper leg, Left upper leg, Face, Right arm   Body parts bathed by helper: Right lower leg, Left lower leg, Buttocks, Front perineal area Body parts n/a: Right arm, Left arm, Right lower leg, Left lower leg   Bathing assist Assist Level: Moderate Assistance - Patient 50 - 74%     Upper Body Dressing/Undressing Upper body dressing   What is the patient wearing?: Pull over shirt    Upper body assist Assist  Level: Minimal Assistance - Patient > 75%    Lower Body Dressing/Undressing Lower body dressing      What is the patient wearing?: Pants, Incontinence brief     Lower body assist Assist for lower body dressing: Maximal Assistance - Patient 25 - 49%     Toileting Toileting    Toileting assist Assist for toileting: Maximal Assistance - Patient 25 - 49%     Transfers Chair/bed transfer  Transfers assist     Chair/bed transfer assist level: Moderate Assistance - Patient 50 - 74%     Locomotion Ambulation   Ambulation assist   Ambulation activity did not occur: Safety/medical concerns  Assist level: 2 helpers Assistive device: Other (comment)(wall rail) Max distance: 92ft   Walk 10 feet activity   Assist  Walk 10 feet activity did not occur: Safety/medical concerns  Assist level: 2 helpers Assistive device: Other (comment)(wall rail)   Walk 50 feet activity   Assist Walk 50 feet with 2 turns activity did not occur: Safety/medical concerns         Walk 150 feet activity   Assist Walk 150 feet activity did not occur: Safety/medical concerns         Walk 10 feet on uneven surface  activity   Assist Walk 10 feet on uneven surfaces activity did not occur: Safety/medical concerns         Wheelchair     Assist Will patient use wheelchair at discharge?: Yes Type of Wheelchair: Manual    Wheelchair assist level: Minimal Assistance -  Patient > 75% Max wheelchair distance: 145ft    Wheelchair 50 feet with 2 turns activity    Assist        Assist Level: Minimal Assistance - Patient > 75%   Wheelchair 150 feet activity     Assist  Wheelchair 150 feet activity did not occur: Safety/medical concerns   Assist Level: Moderate Assistance - Patient 50 - 74%   Blood pressure 130/66, pulse 73, temperature 98.6 F (37 C), temperature source Oral, resp. rate 16, height 5\' 5"  (1.651 m), weight 78.6 kg, SpO2 94 %.  Medical Problem  List and Plan: 1.  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  TEE and loop recorder placement to be discussed as outpatient.  Continue CIR - plan d/c today   2.  Antithrombotics: -DVT/anticoagulation: Subcutaneous heparin.  Venous Doppler studies negative             -antiplatelet therapy: Brilinta 90 mg twice daily, aspirin 81 mg daily 3. Pain Management: Tylenol as needed.  Right shoulder pain, sling for comfort- improved but not resolved  after R subacromial injection   Continue ROM as tolerated, support while seated/sleeping/standing with sling/pillows, lap tray.   Lidocaine patch helps.   4. Mood: Amantadine 100 mg twice daily             -antipsychotic agents: N/A 5. Neuropsych: This patient is not capable of making decisions on his own behalf. 6. Skin/Wound Care: Routine skin checks 7. Fluids/Electrolytes/Nutrition: Routine in and outs 8.  Post stroke dysphagia.  Regular diet/thins with full supervision.   Dietary follow-up as well as speech therapy 9.  Diabetes mellitus with hyperglycemia.  Hemoglobin A1c 9.2.  NovoLog 5 units 2 times daily, Lantus insulin 35 units twice daily.  CBG (last 3)  Recent Labs    05/12/20 1705 05/12/20 2058 05/13/20 0601  GLUCAP 155* 177* 76   Relatively controlled on 6/9 10.  Hypertension.  Cozaar 50 mg daily.   Vitals:   05/12/20 1931 05/13/20 0545  BP: 113/71 130/66  Pulse: 86 73  Resp: 16 16  Temp: 98 F (36.7 C) 98.6 F (37 C)  SpO2: 98% 94%   Controlled on 6/9 11.  Hyperlipidemia.  Lipitor 12.  Obesity.  BMI 29.50.  Dietary follow-up  13.  Post stroke shoulder pain - Kpad and aspercreme, tylenol for pain , no imaging needed at this time no trauma   xray R shoulder shows AC jt arthritis  corticosteroid injection R shoulder performed 5/26 14. Urine retention Resolved     Flomax 0.4mg  - cont  Improving    LOS: 27 days A  FACE TO FACE EVALUATION WAS PERFORMED  Charlett Blake 05/13/2020, 9:05 AM

## 2020-05-13 NOTE — Progress Notes (Signed)
Patient discharged home with wife and daughter. Escorted out by NT with all belongings and equipment.

## 2020-05-13 NOTE — Progress Notes (Signed)
Inpatient Rehabilitation Care Coordinator  Discharge Note  The overall goal for the admission was met for:   Discharge location: Yes, Home  Length of Stay: Yes, 27 Days  Discharge activity level: Yes, MOD A  Home/community participation: Yes  Services provided included: MD, RD, PT, OT, SLP, RN, CM, TR, Pharmacy, Neuropsych and SW  Financial Services: Private Insurance: BCBS Commercial  Follow-up services arranged: Outpatient: [facility]   Comments (or additional information): PT [place] [facility] Bed, Sliding Board, WC, Drop Arm Commode  Patient/Family verbalized understanding of follow-up arrangements: Yes, Medical Transport and SNF placement declined  Individual responsible for coordination of the follow-up plan: daughter, [phone]  Confirmed correct DME delivered: Dyanne Iha 05/13/2020    Dyanne Iha

## 2020-05-13 NOTE — Progress Notes (Signed)
Patient last noted bowel movement was a smear on 6/5. Patient offered sorbitol but refused. Patient educated.

## 2020-05-19 ENCOUNTER — Encounter (HOSPITAL_COMMUNITY): Payer: Self-pay | Admitting: Speech Pathology

## 2020-05-19 ENCOUNTER — Ambulatory Visit (HOSPITAL_COMMUNITY): Payer: BC Managed Care – PPO | Attending: Physician Assistant | Admitting: Speech Pathology

## 2020-05-19 ENCOUNTER — Other Ambulatory Visit: Payer: Self-pay

## 2020-05-19 DIAGNOSIS — R2689 Other abnormalities of gait and mobility: Secondary | ICD-10-CM | POA: Insufficient documentation

## 2020-05-19 DIAGNOSIS — I69890 Apraxia following other cerebrovascular disease: Secondary | ICD-10-CM | POA: Diagnosis not present

## 2020-05-19 DIAGNOSIS — I69820 Aphasia following other cerebrovascular disease: Secondary | ICD-10-CM | POA: Diagnosis not present

## 2020-05-19 DIAGNOSIS — M25511 Pain in right shoulder: Secondary | ICD-10-CM | POA: Diagnosis not present

## 2020-05-19 DIAGNOSIS — I69351 Hemiplegia and hemiparesis following cerebral infarction affecting right dominant side: Secondary | ICD-10-CM | POA: Insufficient documentation

## 2020-05-19 DIAGNOSIS — M6281 Muscle weakness (generalized): Secondary | ICD-10-CM | POA: Diagnosis not present

## 2020-05-19 NOTE — Therapy (Signed)
[facility] [address] [place], [place], [address] Phone: [phone]   Fax:  [phone]  Speech Language Pathology Evaluation  Patient Details  Name: Brad Delacruz MRN: [id number] Date of Birth: 05/22/1961 Referring Provider (SLP): Roney Mans   Encounter Date: 05/19/2020   End of Session - 05/19/20 1843    Visit Number 1    Number of Visits 6    Authorization Type Pt has BCBS Comm PPO but no SLP coverage    SLP Start Time 1555    SLP Stop Time  1700    SLP Time Calculation (min) 65 min    Activity Tolerance Patient tolerated treatment well           Past Medical History:  Diagnosis Date  . Diabetes mellitus without complication [facility])     Past Surgical History:  Procedure Laterality Date  . IR CT HEAD LTD  03/24/2020  . IR CT HEAD LTD  03/24/2020  . IR INTRA CRAN STENT  03/24/2020  . IR PATIENT EVAL TECH 0-60 MINS  03/25/2020  . IR PERCUTANEOUS ART THROMBECTOMY/INFUSION INTRACRANIAL INC DIAG ANGIO  03/24/2020  . RADIOLOGY WITH ANESTHESIA N/A 03/24/2020   Procedure: IR WITH ANESTHESIA;  Surgeon: Julieanne Cotton, MD;  Location: [facility] OR;  Service: Radiology;  Laterality: N/A;    There were no vitals filed for this visit.   Subjective Assessment - 05/19/20 1833    Subjective "No puedo" (I cannot)    Patient is accompained by: Family member;Interpreter    Currently in Pain? No/denies              SLP Evaluation [facility] - 05/19/20 1833      SLP Visit Information   SLP Received On 05/19/20    Referring Provider (SLP) Mariam Dollar PA-C    Onset Date 03/24/20    Medical Diagnosis s/p CVA      Subjective   Subjective "No puedo."    Patient/Family Stated Goal Help him talk      General Information   HPI Brad Delacruz is a 59 y.o. right-handed male with history of diabetes mellitus and hypertension.  Per chart review lives with spouse independent prior to admission.  Presented 03/24/2020 with  right-sided weakness and aphasia to [facility]. Cranial CT scan negative for acute changes.  Remote infarct of the left corona radiata internal capsule.  CT cerebral perfusion scan as well as CT angiogram of head and neck showed a large left MCA territory nonhemorrhagic infarct as well as acute left M1 large vessel occlusion.  Patient was transferred to [facility].  Patient underwent TPA followed by mechanical thrombectomy revascularization per interventional radiology. Echocardiogram with ejection fraction of 60% grade 1 diastolic dysfunction.  TCD bubble study positive for small PFO and lower extremity Dopplers negative for DVT.  Patient remained intubated through 04/01/2020.  Latest follow-up cranial CT scan 04/08/2020 showed evolving large left MCA territory infarct with similar mass-effect no new hemorrhage or hydrocephalus.  Maintained on low-dose aspirin as well as Brilinta for CVA prophylaxis.  Considerations to be made for TEE and loop recorder as outpatient.  Subcutaneous heparin for DVT prophylaxis.  Nasogastric tube for nutritional support diet slowly advanced.  He did complete a course of Ancef 04/05/2020 for pneumonia. He was discharged from inpatient rehab at [facility] on 05/13/20. He is referred for outpatient SLP, PT, and OT by Mariam Dollar, PA-C.    Behavioral/Cognition alert and cooperative, aphasic    Mobility Status wheelchair  Balance Screen   Has the patient fallen in the past 6 months No    Has the patient had a decrease in activity level because of a fear of falling?  Yes    Is the patient reluctant to leave their home because of a fear of falling?  No      Prior Functional Status   Cognitive/Linguistic Baseline Within functional limits    Type of Home House     Lives With Family    Available Support Family    Vocation Full time employment      Cognition   Overall Cognitive Status Impaired/Different from baseline      Auditory Comprehension   Overall Auditory  Comprehension Impaired    Yes/No Questions Impaired    Basic Biographical Questions 76-100% accurate    Commands Impaired    One Step Basic Commands 25-49% accurate    Conversation Simple    Interfering Components Processing speed;Attention;Visual impairments;Motor planning    EffectiveTechniques Extra processing time;Repetition;Visual/Gestural cues;Stressing words      Visual Recognition/Discrimination   Discrimination Not tested      Reading Comprehension   Reading Status Impaired    Word level 0-25% accurate      Expression   Primary Mode of Expression Verbal      Verbal Expression   Overall Verbal Expression Impaired    Initiation Impaired    Automatic Speech Social Response;Counting;Singing    Level of Generative/Spontaneous Verbalization Phrase;Word    Repetition Impaired    Level of Impairment Word level;Phrase level    Naming Impairment    Responsive 0-25% accurate    Confrontation 0-24% accurate    Common Objects Able in field of 2    Photographs Unable to indentify    Convergent Not tested    Divergent Not tested    Pragmatics No impairment    Interfering Components Attention    Non-Verbal Means of Communication Not applicable      Written Expression   Dominant Hand Right    Written Expression Exceptions to [facility]    Trace Ability Letter      Oral Motor/Sensory Function   Overall Oral Motor/Sensory Function Impaired    Labial ROM Reduced right    Labial Symmetry Abnormal symmetry right    Labial Strength Reduced Right    Labial Sensation Reduced Right    Labial Coordination Reduced    Lingual ROM Reduced right      Motor Speech   Overall Motor Speech Impaired    Respiration Within functional limits    Phonation Normal    Resonance Within functional limits    Intelligibility Intelligibility reduced    Word 0-24% accurate    Phrase 0-24% accurate    Motor Planning Impaired    Level of Impairment Word    Motor Speech Errors Aware    Effective  Techniques Pause;Over-articulate    Phonation WFL      SLP - End of Session   Patient left in chair              SLP Short Term Goals - 05/19/20 1845      SLP SHORT TERM GOAL #1   Title Pt will increase naming of common objects/pictures to 70% acc when provided with mod/max multimodality cues.    Baseline reportedly 5/5 with max assist in acute and with family at home    Time 4    Period Weeks    Status New    Target Date 06/25/20  SLP SHORT TERM GOAL #2   Title Pt will match picture/object to written word in field of 3 with 66% acc with mod/max assist.    Baseline 33%    Time 4    Period Weeks    Status New    Target Date 06/25/20      SLP SHORT TERM GOAL #3   Title Pt will complete automatic speech tasks with 80% acc when provided choral cues as needed.    Baseline Reportedly days of week and Happy Birthday in acute, not observed today    Time 4    Period Weeks    Status New    Target Date 06/25/20      SLP SHORT TERM GOAL #4   Title Pt will follow 1-step commands with 75% acc when provided mod/max cues.    Baseline 50%    Time 4    Period Weeks    Status New    Target Date 06/25/20            SLP Long Term Goals - 05/20/20 1501      SLP LONG TERM GOAL #1   Title Pt will be able to express basic wants/needs to [facility] with min assist from caregivers and use of multimodality communication strategies.    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 Vocalizations were limited this session to "No, pero" and "No puedo". He is reportedly talking more at home. Pt's primary language is Spanish, however his family indicates that he was fairly fluent in [place] as well. Pt is consuming regular textures and thin liquids at home without incident, per family. His family feels his  comprehension is "good" and desire to work on his speech. He worked full time as a Dealer for [employer], cut the grass at home, watched television, and read the [place]. Pt will benefit from skilled SLP in order to address the above impairments, maximize independence, and decrease burden of care. Unfortunately, the Pt's health insurance does not cover speech therapy and family is deciding if they can afford to come to therapy. They were given written information to practice with [facility] at home in the meantime. Pt will likely require several months of speech therapy due to the severity of his deficits.      Speech Therapy Frequency 1x /week    Duration 4 weeks    Treatment/Interventions Patient/family education;Compensatory strategies;SLP instruction and feedback;Compensatory techniques;Internal/external aids;Language facilitation;Cueing hierarchy;Multimodal communcation approach    Potential to Achieve Goals Fair    Potential Considerations Severity of impairments;Financial resources   no insurance coverage   SLP Home Exercise Plan Pt will complete HEP as assigned with assist from caregivers    Consulted and Agree with Plan of Care Patient;Family member/caregiver    Family Member Consulted Wife, Susannah           Patient will benefit from skilled therapeutic intervention in order to improve the following deficits and impairments:   Aphasia following other cerebrovascular disease  Apraxia following other cerebrovascular disease    Problem List Patient Active Problem List   Diagnosis Date Noted  . Dysphagia, post-stroke   . Essential hypertension   . Benign essential HTN   . SAH (subarachnoid  hemorrhage) (HCC) 04/16/2020  . Cerebral edema (HCC) 04/16/2020  . Pneumonia 04/16/2020  . Hypertensive emergency 04/16/2020  . Hyperlipidemia 04/16/2020  . Diabetes mellitus type II, uncontrolled (HCC) 04/16/2020  . Dysphagia 04/16/2020  . Protein-calorie malnutrition (HCC)  04/16/2020  . Urinary retention 04/16/2020  . 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 Acute respiratory failure (HCC)   . Acute ischemic stroke (HCC) L MCA d/t L M1 oclusion s/p MCA stent 03/24/2020  . CVA (cerebral vascular accident) (HCC) 03/24/2020  . Middle cerebral artery embolism, left 03/24/2020   Thank you,  Havery Moros, CCC-SLP [phone]  [facility] 05/19/2020, 6:46 PM  [facility] [address] [place], [place], [address] Phone: [phone]   Fax:  [phone]  Name: Brant Peets MRN: [id number] Date of Birth: February 21, 1961

## 2020-05-20 ENCOUNTER — Ambulatory Visit (HOSPITAL_COMMUNITY): Payer: BC Managed Care – PPO | Admitting: Physical Therapy

## 2020-05-20 ENCOUNTER — Encounter (HOSPITAL_COMMUNITY): Payer: Self-pay | Admitting: Physical Therapy

## 2020-05-20 DIAGNOSIS — M6281 Muscle weakness (generalized): Secondary | ICD-10-CM

## 2020-05-20 DIAGNOSIS — I69351 Hemiplegia and hemiparesis following cerebral infarction affecting right dominant side: Secondary | ICD-10-CM | POA: Diagnosis not present

## 2020-05-20 DIAGNOSIS — R2689 Other abnormalities of gait and mobility: Secondary | ICD-10-CM | POA: Diagnosis not present

## 2020-05-20 DIAGNOSIS — I69890 Apraxia following other cerebrovascular disease: Secondary | ICD-10-CM | POA: Diagnosis not present

## 2020-05-20 DIAGNOSIS — I69820 Aphasia following other cerebrovascular disease: Secondary | ICD-10-CM | POA: Diagnosis not present

## 2020-05-20 DIAGNOSIS — M25511 Pain in right shoulder: Secondary | ICD-10-CM | POA: Diagnosis not present

## 2020-05-21 ENCOUNTER — Ambulatory Visit (HOSPITAL_COMMUNITY): Payer: BC Managed Care – PPO | Admitting: Specialist

## 2020-05-21 ENCOUNTER — Encounter (HOSPITAL_COMMUNITY): Payer: Self-pay | Admitting: Specialist

## 2020-05-21 ENCOUNTER — Other Ambulatory Visit: Payer: Self-pay

## 2020-05-21 DIAGNOSIS — I69820 Aphasia following other cerebrovascular disease: Secondary | ICD-10-CM | POA: Diagnosis not present

## 2020-05-21 DIAGNOSIS — M25511 Pain in right shoulder: Secondary | ICD-10-CM | POA: Diagnosis not present

## 2020-05-21 DIAGNOSIS — I69351 Hemiplegia and hemiparesis following cerebral infarction affecting right dominant side: Secondary | ICD-10-CM

## 2020-05-21 DIAGNOSIS — M6281 Muscle weakness (generalized): Secondary | ICD-10-CM | POA: Diagnosis not present

## 2020-05-21 DIAGNOSIS — I69890 Apraxia following other cerebrovascular disease: Secondary | ICD-10-CM | POA: Diagnosis not present

## 2020-05-21 DIAGNOSIS — R2689 Other abnormalities of gait and mobility: Secondary | ICD-10-CM | POA: Diagnosis not present

## 2020-05-21 NOTE — Therapy (Signed)
[place], [place], [address] Phone: [phone]   Fax:  [phone]  Physical Therapy Evaluation  Patient Details  Name: Brad Delacruz MRN: [id number] Date of Birth: 02/14/1961 Referring Provider (PT): Lauraine Rinne    Encounter Date: 05/20/2020   PT End of Session - 05/20/20 1813    Visit Number 1    Number of Visits 24    Date for PT Re-Evaluation 07/17/20    Authorization Type BCBS no auth, no VL    Progress Note Due on Visit 10    PT Start Time 1645    PT Stop Time 1730    PT Time Calculation (min) 45 min    Activity Tolerance Patient tolerated treatment well    Behavior During Therapy [facility] for tasks assessed/performed           Past Medical History:  Diagnosis Date  . Diabetes mellitus without complication [facility])     Past Surgical History:  Procedure Laterality Date  . IR CT HEAD LTD  03/24/2020  . IR CT HEAD LTD  03/24/2020  . IR INTRA CRAN STENT  03/24/2020  . IR PATIENT EVAL TECH 0-60 MINS  03/25/2020  . IR PERCUTANEOUS ART THROMBECTOMY/INFUSION INTRACRANIAL INC DIAG ANGIO  03/24/2020  . RADIOLOGY WITH ANESTHESIA N/A 03/24/2020   Procedure: IR WITH ANESTHESIA;  Surgeon: Luanne Bras, MD;  Location: [facility];  Service: Radiology;  Laterality: N/A;    There were no vitals filed for this visit.    Subjective Assessment - 05/20/20 1706    Subjective Patient presents to physical therapy accompanied by his wife and interpreter. Patient and his wife are Spanish speaking but patient currently does not speak. History is somewhat difficult to discern because of this. Patients wife describes that patient had some sort of accident at work on 03/24/20, she is not sure of the details. She says she was called to the hospital and her husband, who was previously independent with ADLs, was no longer able to speak and presents with onset of RT side weakness. Patient had CT scan which showed LT MCA infarct.  Patient presents today which chief complaint of RT sided weakness and decreased mobility. Patient currently in wheelchair, and as reported per wife cannot ambulate with walker. She says she is assisting him with ADLs and transfers at home. Patient reports no pain currently.    Patient is accompained by: Family member;Interpreter    Pertinent History LT MCA infarct 03/24/20, DM    Limitations Sitting;Lifting;Standing;Walking;House hold activities    Patient Stated Goals Patient doesnt speak    Currently in Pain? No/denies                    05/20/20 0001  Assessment  Medical Diagnosis LT MCA   Referring Provider (PT) Lauraine Rinne   Onset Date/Surgical Date 03/24/20  Prior Therapy Inpatient rehab until 05/13/20  Precautions  Precautions Fall  Restrictions  Weight Bearing Restrictions No  Home Environment  Living Environment Private residence  Living Arrangements Spouse/significant other;Children  Available Help at Discharge [place] entrance  [place] One level  Prior Function  Level of [place] Full time employment  Vocation Requirements [employer]   Cognition  Overall Cognitive Status Impaired/Different from baseline  Sensation  Light Touch Impaired by gross assessment (decreased about RLE )  ROM / Strength  AROM / PROM / Strength Strength  Strength  Overall Strength Comments Hemi paresis  throughout RLE, trace contraction about RT hip flexors  Strength Assessment Site Hip;Knee;Ankle  Right/Left Hip Left  Right/Left Knee Left  Right/Left Ankle Left  Left Hip Flexion 4-/5  Left Knee Flexion 4/5  Left Knee Extension 4/5  Left Ankle Dorsiflexion 4+/5  Transfers  Transfers Sit to Stand;Stand to Sit;Stand Pivot Transfers  Sit to Stand 4: Min assist;3: Mod assist  Sit to Stand Details Tactile cues for weight shifting;Verbal cues for technique  Stand to Sit Uncontrolled descent;4: Min assist  Stand to Sit Details  (indicate cue type and reason) Verbal cues for technique;Verbal cues for precautions/safety;Manual facilitation for placement;Manual facilitation for weight bearing  Stand Pivot Transfers 4: Min assist;3: Mod assist  Balance  Balance Assessed Yes  Static Sitting Balance  Static Sitting - Balance Support Feet supported;No upper extremity supported  Static Sitting - Level of Assistance 7: Independent  Static Standing Balance  Static Standing - Balance Support Bilateral upper extremity supported  Static Standing - Level of Assistance 4: Min assist                PT Education - 05/20/20 1707    Education Details on evaluation findings and POC    Person(s) Educated Patient;Spouse    Methods Explanation    Comprehension Verbalized understanding            PT Short Term Goals - 05/21/20 1829      PT SHORT TERM GOAL #1   Title Patient will be independent with initial HEP and self-management strategies to improve functional outcomes    Time 4    Period Weeks    Status New    Target Date 06/19/20      PT SHORT TERM GOAL #2   Title Patient will be able to perform stand Mod I to demonstrate improvement in functional mobility and reduced risk for falls.    Time 4    Period Weeks    Status New    Target Date 06/19/20      PT SHORT TERM GOAL #3   Title Patient will be able to maintain standing balance up to 45 sec with use of hemi walker to demo improved functional mobility.    Time 4    Period Weeks    Status New    Target Date 06/19/20             PT Long Term Goals - 05/21/20 1831      PT LONG TERM GOAL #1   Title Patient will be able to perform stand x 5 in < 60 seconds to demonstrate improvement in functional mobility and reduced risk for falls.    Time 8    Period Weeks    Status New    Target Date 07/17/20      PT LONG TERM GOAL #2   Title Patient will be able to ambulate at least 75 feet during with LRAD to demonstrate improved ability to perform  functional mobility and associated tasks.    Time 8    Period Weeks    Status New    Target Date 07/17/20      PT LONG TERM GOAL #3   Title Patient will be able to maintain standing balance up to 2 min with use of hemi walker to demo improved functional mobility.    Time 8    Period Weeks    Status New    Target Date 07/17/20  Plan - 05/20/20 1824    Clinical Impression Statement Patient is a 59 y.o. male who presents to physical therapy with complaint of RT sided weakness s/p LT MCA on 03/24/20. Patient presents with RT side hemiparesis of upper and lower extremity and demonstrates decreased strength, reduced ROM, and balance deficits which are negatively impacting patient ability to perform ADLs and functional mobility tasks. Patient will benefit from skilled physical therapy services to address these deficits to improve level of function with ADLs, and functional mobility tasks    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, DM, aphasia    Examination-Activity Limitations Bathing;Lift;Bed Mobility;Locomotion Level;Bend;Caring for Others;Self Feeding;Transfers;Reach Overhead;Carry;Sit;Dressing;Hygiene/Grooming;Stairs;Squat;Stand;Toileting    Examination-Participation Restrictions Yard Work;Driving;Community Activity;Cleaning    Stability/Clinical Decision Making Evolving/Moderate complexity    Clinical Decision Making Moderate    Rehab Potential Fair    PT Frequency 3x / week    PT Duration 8 weeks    PT Treatment/Interventions ADLs/Self Care Home Management;Aquatic Therapy;Biofeedback;Cryotherapy;Electrical Stimulation;Contrast Bath;Therapeutic exercise;Therapeutic activities;Fluidtherapy;Parrafin;Patient/family education;Orthotic Fit/Training;Manual lymph drainage;Compression bandaging;Splinting;Taping;Vasopneumatic Device;Joint Manipulations;Spinal Manipulations;Energy conservation;Dry needling;Passive range of  motion;Scar mobilization;Prosthetic Training;Wheelchair mobility training;Balance training;Neuromuscular re-education;DME Instruction;Gait training;Iontophoresis 4mg /ml Dexamethasone;Moist Heat;Stair training;Traction;Functional mobility training;Ultrasound;Cognitive remediation;Manual techniques    PT Next Visit Plan Review goals. Issue applicable HEP. Assess bed mobility. Progress strength with focus on functional mobility and transfers. Progress to gait training with hemi walker if and when tolerated    PT Home Exercise Plan Issue next visit    Consulted and Agree with Plan of Care Patient;Family member/caregiver    Family Member Consulted Wife           Patient will benefit from skilled therapeutic intervention in order to improve the following deficits and impairments:  Decreased balance, Difficulty walking, Impaired UE functional use, Decreased strength, Decreased range of motion, Decreased activity tolerance, Decreased mobility, Impaired sensation, Improper body mechanics, Decreased cognition, Abnormal gait, Decreased coordination  Visit Diagnosis: Other abnormalities of gait and mobility  Muscle weakness (generalized)     Problem List Patient Active Problem List   Diagnosis Date Noted  . Dysphagia, post-stroke   . Essential hypertension   . Benign essential HTN   . SAH (subarachnoid hemorrhage) (HCC) 04/16/2020  . Cerebral edema (HCC) 04/16/2020  . Pneumonia 04/16/2020  . Hypertensive emergency 04/16/2020  . Hyperlipidemia 04/16/2020  . Diabetes mellitus type II, uncontrolled (HCC) 04/16/2020  . Dysphagia 04/16/2020  . Protein-calorie malnutrition (HCC) 04/16/2020  . Urinary retention 04/16/2020  . 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 Acute respiratory failure (HCC)   . Acute ischemic stroke (HCC) L MCA d/t L M1 oclusion s/p MCA stent 03/24/2020  . CVA (cerebral vascular accident) (HCC) 03/24/2020  . Middle cerebral artery embolism, left 03/24/2020     6:47 PM, 05/21/20 05/23/20 PT DPT  Physical Therapist with [facility]  [facility]  [phone]   [facility] [address] [place], [place], [place] Phone: [phone]   Fax:  [phone]  Name: Brad Delacruz MRN: Signe Colt Date of Birth: [birth year]

## 2020-05-22 NOTE — Therapy (Signed)
[place], [place], [address] Phone: [phone]   Fax:  [phone]  Occupational Therapy Evaluation  Patient Details  Name: Brad Delacruz MRN: [id number] Date of Birth: 21-Mar-1961 Referring Provider (OT): Lauraine Rinne, [place]    Encounter Date: 05/21/2020   OT End of Session - 05/21/20 1757    Visit Number 1    Number of Visits 24    Date for OT Re-Evaluation 08/21/20   mini reassess on 07/02/20   Authorization Type BCBS PPO    Authorization Time Period no auth no viist limit    OT Start Time 1520    OT Stop Time 1600    OT Time Calculation (min) 40 min    Activity Tolerance Patient tolerated treatment well    Behavior During Therapy [facility] for tasks assessed/performed   aphasic          Past Medical History:  Diagnosis Date  . Diabetes mellitus without complication [facility])     Past Surgical History:  Procedure Laterality Date  . IR CT HEAD LTD  03/24/2020  . IR CT HEAD LTD  03/24/2020  . IR INTRA CRAN STENT  03/24/2020  . IR PATIENT EVAL TECH 0-60 MINS  03/25/2020  . IR PERCUTANEOUS ART THROMBECTOMY/INFUSION INTRACRANIAL INC DIAG ANGIO  03/24/2020  . RADIOLOGY WITH ANESTHESIA N/A 03/24/2020   Procedure: IR WITH ANESTHESIA;  Surgeon: Luanne Bras, MD;  Location: [facility];  Service: Radiology;  Laterality: N/A;    There were no vitals filed for this visit.   Subjective Assessment - 05/21/20 1752    Subjective  S:  per family we want him to walk, to speak and to move his arm.    Patient is accompanied by: Family member;Interpreter    Pertinent History [name] was at work on 03/24/20 and had an accident, resulting in a CVA with RUE and RLE hemiparesis and expressive aphasia.  He was admitted to CIR and recieved extensive PT, OT, ST and was dc home on 05/13/20.  He has been referred to OT for evaluation and treatment.    Patient Stated Goals We want him to walk, talk, and move his arm    Currently in Pain?  Yes    Pain Score --   unable to tell number, grimacing face   Pain Location Shoulder    Pain Orientation Right    Pain Descriptors / Indicators Other (Comment)    Pain Type Acute pain    Pain Radiating Towards arm    Pain Onset More than a month ago    Pain Frequency Intermittent    Aggravating Factors  movement and weightbearing             [facility] OT Assessment - 05/22/20 0001      Assessment   Medical Diagnosis LT MCA     Referring Provider (OT) Lauraine Rinne, PA     Onset Date/Surgical Date 03/24/20    Hand Dominance Right    Prior Therapy Inpatient rehab until 05/13/20      Precautions   Precautions Fall      Restrictions   Weight Bearing Restrictions No      Balance Screen   Has the patient fallen in the past 6 months No    Has the patient had a decrease in activity level because of a fear of falling?  Yes    Is the patient reluctant to leave their home because of a fear of falling?  No  Home  Environment   Family/patient expects to be discharged to: Private residence    Living Arrangements Spouse/significant other    Lives With Spouse;Family      Prior Function   Level of Independence Independent    Vocation Full time employment    Vocation Requirements worked at [employer]     Leisure playing with family dog, [place]      ADL   Eating/Feeding Set up    Grooming Set up    Upper Body Bathing Maximal assistance    Lower Body Bathing Maximal assistance    Upper Body Dressing Minimal assistance    Lower Body Dressing Moderate assistance    Toilet Transfer Moderate assistance   drop arm bsc   Tub/Shower Transfer Other (comment)   has not attempted    ADL comments RUE hemiplegia causing decreased independence with all BADLS - wife is providing significant assistance with all BADLs.  completing BADLs from wc level.  Patient daughter states he is doing well with all tasks except transfer from wc to bsc      Mobility   Mobility Status Needs assist     Mobility Status Comments modified stand pivot transfer with sba for safety to left to from wc to mat table      Written Expression   Dominant Hand Right    Handwriting --   not tested     Vision - History   Baseline Vision No visual deficits      Vision Assessment   Comment needs further assessment - daughter states that MD dx patient with diplopia - she is not sure it is diplopia - not sure if chemicals he fell into affected his vision or if it was from CVA?  Difficult to assess functional visual deficits this date as communication barrier and aphasia present       Cognition   Overall Cognitive Status Impaired/Different from baseline    Cognition Comments needs further assessment      Observation/Other Assessments   Focus on Therapeutic Outcomes (FOTO)  to be completed at next       Sensation   Light Touch Impaired by gross assessment      Coordination   Gross Motor Movements are Fluid and Coordinated No    Fine Motor Movements are Fluid and Coordinated No      Perception   Perception Impaired    Comments needs further assessment -depth perception appears affected      Praxis   Praxis Not tested      Tone   Assessment Location Right Upper Extremity      ROM / Strength   AROM / PROM / Strength AROM;PROM;Strength      Palpation   Palpation comment 1 finger width right shoulder subluxation      AROM   Overall AROM Comments 0 A/ROM in RUE      PROM   Overall PROM Comments P/ROM is RUE is [facility]      Strength   Overall Strength Comments RUE strength 0/5 due to hemiparesis      RUE Tone   RUE Tone Brunnstrom Scale    Brunnstrom Scale (RUE) No increase in muscle tone                           OT Education - 05/21/20 1756    Education Details educated patient and his family on weightbearing techniques as well as encouraged him to participate as  much as possible in BADL completion doing as much as he can on his own as long as he is doing so safely     Person(s) Educated Patient;Spouse;Child(ren)    Methods Explanation;Demonstration;Handout    Comprehension Verbalized understanding            OT Short Term Goals - 05/21/20 1805      OT SHORT TERM GOAL #1   Title Patient will be educated on a HEP for ADL completion and improved RUE function.    Time 6    Period Weeks    Status New    Target Date 07/02/20      OT SHORT TERM GOAL #2   Title Paitent will complete dressing, bathing, grooming, and toileting with min pa.    Time 6    Period Weeks    Status New      OT SHORT TERM GOAL #3   Title Patient will propel wheelchair independently and safely household distances.    Time 6    Period Weeks    Status New      OT SHORT TERM GOAL #4   Title Patients vision will be assessed for deficits.    Time 6    Period Weeks    Status New      OT SHORT TERM GOAL #5   Title Patient will use RUE as a gross assist during ADL completion and transfers.    Time 6    Period Weeks    Status New      Additional Short Term Goals   Additional Short Term Goals Yes      OT SHORT TERM GOAL #6   Title Patient will weightbear on his RUE with mod  facilitation to maintain positioning while shifting weight on and off of RUE during functional tasks.    Time 6    Period Weeks    Status New             OT Long Term Goals - 05/21/20 1808      OT LONG TERM GOAL #1   Title Patient will complete B/IADLS at highest level of independence possible.    Time 12    Period Weeks    Status New    Target Date 08/13/20      OT LONG TERM GOAL #2   Title Patent will complete dressing, bathing, grooming, and toileting independently.    Time 12    Period Weeks    Status New      OT LONG TERM GOAL #3   Title Paitent will be educated and independent with compensatory techniques for visual deficits.    Time 12    Period Weeks    Status New      OT LONG TERM GOAL #4   Title Patient will use his RUE as an active assist during ADL completion.     Time 12    Period Weeks    Status New      OT LONG TERM GOAL #5   Title Paitent will weightbear on his RUE and weightshift during ADLs independently.    Time 12    Period Weeks    Status New      Long Term Additional Goals   Additional Long Term Goals Yes      OT LONG TERM GOAL #6   Title Patient will improve tone in RUE from Brunnstrom I to III in order to use RUE actively during adl completion.    Time  12    Period Weeks    Status New                 Plan - 05/21/20 1759    Clinical Impression Statement A:  Paitent is a 59 year old male right hand dominant with RUE hemiparesis, RLE hemiparesis, and expressive aphasia s/p Lt MCA on 03/24/20.  Patient is wheelchair dependent and requires significant assistance with his BADLs at this time.  Patient is unable to use his dominant RUE to complete any B/IADLs at this time.    OT Occupational Profile and History Comprehensive Assessment- Review of records and extensive additional review of physical, cognitive, psychosocial history related to current functional performance    Occupational performance deficits (Please refer to evaluation for details): ADL's;IADL's;Work;Leisure;Social Participation    Body Structure / Function / Physical Skills ADL;Decreased knowledge of use of DME;Strength;Tone;Pain;GMC;Dexterity;Balance;UE functional use;ROM;IADL;Vision;Coordination;FMC    Cognitive Skills Thought    Rehab Potential Fair    Clinical Decision Making Multiple treatment options, significant modification of task necessary    Comorbidities Affecting Occupational Performance: None    Modification or Assistance to Complete Evaluation  Max significant modification of tasks or assist is necessary to complete    OT Frequency 2x / week    OT Duration 12 weeks    OT Treatment/Interventions Self-care/ADL training;Electrical Stimulation;Therapeutic exercise;Visual/perceptual remediation/compensation;Patient/family education;Splinting;Neuromuscular  education;Moist Heat;Energy conservation;Therapeutic activities;Passive range of motion;Manual Therapy;DME and/or AE instruction;Ultrasound;Cryotherapy    Plan P:  Skilled OT intervention to improve independence with all B/IADLs, transfers, bed mobility, wheelchair mobility, as well as improve movement in LUE in order to use LUE as a gross and even active assist with daily tasks. further assess visual deficits as able.    OT Home Exercise Plan weighbearing, functional adl completion    Consulted and Agree with Plan of Care Patient;Family member/caregiver    Family Member Consulted wife and daughter           Patient will benefit from skilled therapeutic intervention in order to improve the following deficits and impairments:   Body Structure / Function / Physical Skills: ADL, Decreased knowledge of use of DME, Strength, Tone, Pain, GMC, Dexterity, Balance, UE functional use, ROM, IADL, Vision, Coordination, [facility] Cognitive Skills: Thought     Visit Diagnosis: Hemiplegia and hemiparesis following cerebral infarction affecting right dominant side (HCC) - Plan: Ot plan of care cert/re-cert  Acute pain of right shoulder - Plan: Ot plan of care cert/re-cert    Problem List Patient Active Problem List   Diagnosis Date Noted  . Dysphagia, post-stroke   . Essential hypertension   . Benign essential HTN   . SAH (subarachnoid hemorrhage) (HCC) 04/16/2020  . Cerebral edema (HCC) 04/16/2020  . Pneumonia 04/16/2020  . Hypertensive emergency 04/16/2020  . Hyperlipidemia 04/16/2020  . Diabetes mellitus type II, uncontrolled (HCC) 04/16/2020  . Dysphagia 04/16/2020  . Protein-calorie malnutrition (HCC) 04/16/2020  . Urinary retention 04/16/2020  . 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 Acute respiratory failure (HCC)   . Acute ischemic stroke (HCC) L MCA d/t L M1 oclusion s/p MCA stent 03/24/2020  . CVA (cerebral vascular accident) (HCC) 03/24/2020  . Middle cerebral artery  embolism, left 03/24/2020    Shirlean Mylar, MHA, OTR/L [phone]  05/22/2020, 8:58 AM  [facility] [address] [place], [place], [address] Phone: [phone]   Fax:  [phone]  Name: Viraaj Vorndran MRN: [id number] Date of Birth: 10-Jun-1961

## 2020-05-25 ENCOUNTER — Other Ambulatory Visit: Payer: Self-pay

## 2020-05-25 ENCOUNTER — Encounter: Payer: BC Managed Care – PPO | Attending: Registered Nurse | Admitting: Registered Nurse

## 2020-05-25 ENCOUNTER — Encounter: Payer: Self-pay | Admitting: Registered Nurse

## 2020-05-25 VITALS — BP 132/74 | HR 71 | Temp 97.0°F | Ht 65.0 in

## 2020-05-25 DIAGNOSIS — I10 Essential (primary) hypertension: Secondary | ICD-10-CM

## 2020-05-25 DIAGNOSIS — I63512 Cerebral infarction due to unspecified occlusion or stenosis of left middle cerebral artery: Secondary | ICD-10-CM | POA: Diagnosis not present

## 2020-05-25 DIAGNOSIS — E7849 Other hyperlipidemia: Secondary | ICD-10-CM | POA: Diagnosis not present

## 2020-05-25 NOTE — Progress Notes (Signed)
Subjective:    Patient ID: Brad Delacruz, male    DOB: 02-25-1961, 59 y.o.   MRN: [id number]  HPI: Brad Delacruz is a 59 y.o. male who is here for Hospital Follow up appointment for his Left Middle Cerebral Artery Stroke, Essential Hypertension and Hyperlipidemia. He Presented to [facility] on 04/20/2021with right sided weakness and aphasia. Neurology was consulted and he received TPA. He was transferred to [facility].  CT Head WO Contrast:  IMPRESSION: 1. No acute intracranial abnormality. 2. 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 3. ASPECTS is 10/10  CT Cerebral Perfusion W Contrast:  IMPRESSION: 1. Large left MCA territory nonhemorrhagic infarct. 2. Acute left M1 large vessel occlusion. 3. Large penumbra with mismatch volume of 99 mL. 4. Collateral vessels are present within the sylvian fissure. 5. Atherosclerotic changes at the carotid bifurcations bilaterally without significant stenosis. 6. Aortic Atherosclerosis (ICD10-I70.0). No significant stenosis at the arch. He underwent mechanical thrombectomy with revascularization and rescue stent placement via [facility] Rad[facility].   He was admitted to inpatient radiology on 04/16/2020 and discharged home on 05/13/2020.He is receiving Outpatient Therapy at [facility]. He states he has pain in his right lower extremity. He rated his pain 2. Translator in Room: Eddie. Mr. Penrod with aphasia.  Daughter Reports Mr. Dat has a good appetite. Arrived in wheelchair   Pain Inventory Average Pain 0 Pain Right Now 2 My pain is intermittent and aching  In the last 24 hours, has pain interfered with the following? General activity 1 Relation with others 0 Enjoyment of life 0 What TIME of day is your pain at its worst? night Sleep (in general) Good  Pain is worse with: sitting Pain improves with: rest and medication Relief from Meds: 10  Mobility ability to climb  steps?  no do you drive?  no use a wheelchair needs help with transfers transfers alone  Function disabled: date disabled on medical leave. I need assistance with the following:  bathing, toileting, meal prep, household duties and shopping Do you have any goals in this area?  yes  Neuro/Psych trouble walking  Prior Studies New Patient  Physicians involved in your care New Patient.   Family History  Problem Relation Age of Onset  . Stroke Mother   . Hypertension Mother   . Heart attack Father   . Diabetes Father    Social History   Socioeconomic History  . Marital status: Married    Spouse name: Not on file  . Number of children: Not on file  . Years of education: Not on file  . Highest education level: Not on file  Occupational History  . Not on file  Tobacco Use  . Smoking status: Never Smoker  . Smokeless tobacco: Never Used  Substance and Sexual Activity  . Alcohol use: Not Currently  . Drug use: Never  . Sexual activity: Not on file  Other Topics Concern  . Not on file  Social History Narrative  . Not on file   Social Determinants of Health   Financial Resource Strain:   . Difficulty of Paying Living Expenses:   Food Insecurity:   . Worried About Programme researcher, broadcasting/film/video in the Last Year:   . Barista in the Last Year:   Transportation Needs:   . Freight forwarder (Medical):   Marland Kitchen Lack of Transportation (Non-Medical):   Physical Activity:   . Days of Exercise per Week:   . Minutes of Exercise  per Session:   Stress:   . Feeling of Stress :   Social Connections:   . Frequency of Communication with Friends and Family:   . Frequency of Social Gatherings with Friends and Family:   . Attends Religious Services:   . Active Member of Clubs or Organizations:   . Attends Archivist Meetings:   Marland Kitchen Marital Status:    Past Surgical History:  Procedure Laterality Date  . IR CT HEAD LTD  03/24/2020  . IR CT HEAD LTD  03/24/2020  . IR INTRA  CRAN STENT  03/24/2020  . IR PATIENT EVAL TECH 0-60 MINS  03/25/2020  . IR PERCUTANEOUS ART THROMBECTOMY/INFUSION INTRACRANIAL INC DIAG ANGIO  03/24/2020  . RADIOLOGY WITH ANESTHESIA N/A 03/24/2020   Procedure: IR WITH ANESTHESIA;  Surgeon: Luanne Bras, MD;  Location: [facility];  Service: Radiology;  Laterality: N/A;   Past Medical History:  Diagnosis Date  . Diabetes mellitus without complication (HCC)    BP 132/74   Pulse 71   Temp (!) 97 F (36.1 C)   Ht 5\' 5"  (1.651 m)   SpO2 95%   BMI 28.84 kg/m   Opioid Risk Score:   Fall Risk Score:  `1  Depression screen PHQ 2/9  Depression screen PHQ 2/9 05/25/2020  Decreased Interest 0  Down, Depressed, Hopeless 0  PHQ - 2 Score 0  Altered sleeping 0  Tired, decreased energy 0  Change in appetite 0  Feeling bad or failure about yourself  0  Trouble concentrating 1  Moving slowly or fidgety/restless 0  Suicidal thoughts 0  PHQ-9 Score 1   Review of Systems  Constitutional: Negative.   HENT: Positive for postnasal drip and sinus pressure.   Eyes: Negative.        Possible vision problem  Respiratory: Positive for cough.   Cardiovascular: Negative.   Gastrointestinal: Negative.   Genitourinary: Negative.   Musculoskeletal: Positive for gait problem.  Skin: Negative.   Allergic/Immunologic: Negative.   Neurological: Positive for dizziness.  Hematological: Negative.   Psychiatric/Behavioral: Negative.        Objective:   Physical Exam Vitals and nursing note reviewed.  Constitutional:      Appearance: Normal appearance.  Cardiovascular:     Rate and Rhythm: Normal rate and regular rhythm.     Pulses: Normal pulses.     Heart sounds: Normal heart sounds.  Musculoskeletal:     Cervical back: Normal range of motion and neck supple.     Comments: Normal Muscle Bulk and Muscle Testing Reveals:  Upper Extremities: Right: Paralysis Left Upper Extremity: Full ROM and Muscle Strength 5/5 Lower Extremities: Right:  Paralysis Left Lower Extremity: Full ROM and Muscle Strength 5/5  Arrived in wheelchair  Skin:    General: Skin is warm and dry.  Neurological:     Mental Status: He is alert and oriented to person, place, and time.  Psychiatric:        Mood and Affect: Mood normal.        Behavior: Behavior normal.           Assessment & Plan:   1.Left Middle Cerebral Artery Stroke: Continue Outpatient Therapy with [facility]. Daughter was instructed to call [facility] to scheduled appointment with Neurology, she verbalizes understanding. Continue to Monitor.  2. Essential Hypertension: Continue current Medication Regimen. PCP Following 3. Hyperlipidemia.Continue current medication regimen. PCP following.   40minutes of face to face patient care time was spent during this visit. All  questions were encouraged and answered.  F/U with Dr Wynn Banker in 4-6 week

## 2020-05-25 NOTE — Patient Instructions (Signed)
Please Call [facility] : [phone]  Schedule Hospital Follow Up

## 2020-05-26 ENCOUNTER — Ambulatory Visit (HOSPITAL_COMMUNITY): Payer: BC Managed Care – PPO | Admitting: Occupational Therapy

## 2020-05-26 ENCOUNTER — Other Ambulatory Visit: Payer: Self-pay

## 2020-05-26 DIAGNOSIS — R2689 Other abnormalities of gait and mobility: Secondary | ICD-10-CM | POA: Diagnosis not present

## 2020-05-26 DIAGNOSIS — I69351 Hemiplegia and hemiparesis following cerebral infarction affecting right dominant side: Secondary | ICD-10-CM | POA: Diagnosis not present

## 2020-05-26 DIAGNOSIS — M25511 Pain in right shoulder: Secondary | ICD-10-CM | POA: Diagnosis not present

## 2020-05-26 DIAGNOSIS — I69820 Aphasia following other cerebrovascular disease: Secondary | ICD-10-CM | POA: Diagnosis not present

## 2020-05-26 DIAGNOSIS — I69890 Apraxia following other cerebrovascular disease: Secondary | ICD-10-CM | POA: Diagnosis not present

## 2020-05-26 DIAGNOSIS — M6281 Muscle weakness (generalized): Secondary | ICD-10-CM | POA: Diagnosis not present

## 2020-05-26 NOTE — Therapy (Signed)
[place], [place], [address] Phone: [phone]   Fax:  [phone]  Occupational Therapy Treatment  Patient Details  Name: Brad Delacruz MRN: [id number] Date of Birth: Jan 18, 1961 Referring Provider (OT): Lauraine Rinne, [place]    Encounter Date: 05/26/2020   OT End of Session - 05/26/20 1718    Visit Number 2    Number of Visits 24    Date for OT Re-Evaluation 08/21/20   mini reassess on 07/02/20   Authorization Type BCBS PPO    Authorization Time Period no auth no viist limit    OT Start Time 1517    OT Stop Time 1602    OT Time Calculation (min) 45 min    Activity Tolerance Patient tolerated treatment well    Behavior During Therapy [facility] for tasks assessed/performed   aphasic          Past Medical History:  Diagnosis Date  . Diabetes mellitus without complication [facility])     Past Surgical History:  Procedure Laterality Date  . IR CT HEAD LTD  03/24/2020  . IR CT HEAD LTD  03/24/2020  . IR INTRA CRAN STENT  03/24/2020  . IR PATIENT EVAL TECH 0-60 MINS  03/25/2020  . IR PERCUTANEOUS ART THROMBECTOMY/INFUSION INTRACRANIAL INC DIAG ANGIO  03/24/2020  . RADIOLOGY WITH ANESTHESIA N/A 03/24/2020   Procedure: IR WITH ANESTHESIA;  Surgeon: Luanne Bras, MD;  Location: [facility];  Service: Radiology;  Laterality: N/A;    There were no vitals filed for this visit.   Subjective Assessment - 05/26/20 1609    Subjective  S: "He just woke up from a nap." Brad    Patient is accompanied by: Family member;Interpreter    Pertinent History Brad Delacruz was at work on 03/24/20 and had an accident, resulting in a CVA with RUE and RLE hemiparesis and expressive aphasia.  He was admitted to CIR and recieved extensive PT, OT, ST and was dc home on 05/13/20.  He has been referred to OT for evaluation and treatment.    Currently in Pain? Yes    Pain Score 8     Pain Location Arm   Bicep area   Pain Orientation Right    Pain Type  Acute pain                        OT Treatments/Exercises (OP) - 05/26/20 1709      Exercises   Exercises Shoulder      Shoulder Exercises: Seated   Retraction 10 reps;PROM    Retraction Limitations Pt with decreased movement at scapula. Requiring Max cues for sequencing of retraction for BUEs. Max A for mobilization of right scapula    Flexion PROM;10 reps      Shoulder Exercises: Stretch   Elbow Flexion Self ROM;10 reps;Seated      Visual/Perceptual Exercises   Other Exercises Continue to assess vision. Pt able to reach with LUE to grasp cones. Denies diplopia. However, has occlussion glasses blocking right lens. However, during conversation pt continueing to cover his left eye      Neurological Re-education Exercises   Shoulder Flexion Self ROM;10 reps   Rest breaks after 5 reps due to pain   Elbow Extension Self ROM;10 reps;Seated    Weight Bearing Position Seated    Seated with weight on forearm Lateral leaning to place weight bearing thorugh RUE and then push into sitting. Cues for placing L hand over R  hand when pushing up. 10reps. 2 sets with rest breaks    Weight Shifting --      Weight Bearing Technique   Weight Bearing Technique Yes      Manual Therapy   Manual Therapy Myofascial release    Manual therapy comments Manual therapy completed prior to exercises.     Myofascial Release Myofascial release and manual stretching completed to right UE (shoulder and upper arm) to decrease fascial restrictions and increase joint mobility in a pain free zone.                    OT Education - 05/26/20 1717    Education Details Provided handout and education on laterally leaning to right for weight bearing through forearm    Person(s) Educated Patient;Spouse;Child(ren)    Methods Explanation;Demonstration;Handout    Comprehension Verbalized understanding            OT Short Term Goals - 05/21/20 1805      OT SHORT TERM GOAL #1   Title Patient will  be educated on a HEP for ADL completion and improved RUE function.    Time 6    Period Weeks    Status New    Target Date 07/02/20      OT SHORT TERM GOAL #2   Title Paitent will complete dressing, bathing, grooming, and toileting with min pa.    Time 6    Period Weeks    Status New      OT SHORT TERM GOAL #3   Title Patient will propel wheelchair independently and safely household distances.    Time 6    Period Weeks    Status New      OT SHORT TERM GOAL #4   Title Patients vision will be assessed for deficits.    Time 6    Period Weeks    Status New      OT SHORT TERM GOAL #5   Title Patient will use RUE as a gross assist during ADL completion and transfers.    Time 6    Period Weeks    Status New      Additional Short Term Goals   Additional Short Term Goals Yes      OT SHORT TERM GOAL #6   Title Patient will weightbear on his RUE with mod  facilitation to maintain positioning while shifting weight on and off of RUE during functional tasks.    Time 6    Period Weeks    Status New             OT Long Term Goals - 05/21/20 1808      OT LONG TERM GOAL #1   Title Patient will complete B/IADLS at highest level of independence possible.    Time 12    Period Weeks    Status New    Target Date 08/13/20      OT LONG TERM GOAL #2   Title Patent will complete dressing, bathing, grooming, and toileting independently.    Time 12    Period Weeks    Status New      OT LONG TERM GOAL #3   Title Paitent will be educated and independent with compensatory techniques for visual deficits.    Time 12    Period Weeks    Status New      OT LONG TERM GOAL #4   Title Patient will use his RUE as an active assist during ADL completion.  Time 12    Period Weeks    Status New      OT LONG TERM GOAL #5   Title Paitent will weightbear on his RUE and weightshift during ADLs independently.    Time 12    Period Weeks    Status New      Long Term Additional Goals    Additional Long Term Goals Yes      OT LONG TERM GOAL #6   Title Patient will improve tone in RUE from [place] I to III in order to use RUE actively during adl completion.    Time 12    Period Weeks    Status New                 Plan - 05/26/20 1718    Clinical Impression Statement A: Focused session on further questions about funcitonal performance at home, pain, and visual deficits. Pt reporting significant pain at right shoulder, upper arm, and chest with ROM. Providing myofascial release and PROM to decrease pain and increased acitvity tolerance. Pt performing lateral leaning for weight bearing through R forearm. Scapular retraction with Max verbal and visal cues from interpeeter as well as Max physical A for scapular movement at right side; tactile cues for left movement. Providing cues thorughout for form and technique as well as education on reasion for weight bearing.    Body Structure / Function / Physical Skills ADL;Decreased knowledge of use of DME;Strength;Tone;Pain;GMC;Dexterity;Balance;UE functional use;ROM;IADL;Vision;Coordination;FMC    Plan P: continue weight bearing. Try forward flexion stretches with therapy ball.    OT Home Exercise Plan eval: weighbearing, functional adl completion    Consulted and Agree with Plan of Care Patient;Family member/caregiver    Family Member Consulted Brad Delacruz) and Brad Delacruz)           Patient will benefit from skilled therapeutic intervention in order to improve the following deficits and impairments:   Body Structure / Function / Physical Skills: ADL, Decreased knowledge of use of DME, Strength, Tone, Pain, GMC, Dexterity, Balance, UE functional use, ROM, IADL, Vision, Coordination, [facility]       Visit Diagnosis: Hemiplegia and hemiparesis following cerebral infarction affecting right dominant side (HCC)  Acute pain of right shoulder    Problem List Patient Active Problem List   Diagnosis Date Noted  . Dysphagia,  post-stroke   . Essential hypertension   . Benign essential HTN   . SAH (subarachnoid hemorrhage) (HCC) 04/16/2020  . Cerebral edema (HCC) 04/16/2020  . Pneumonia 04/16/2020  . Hypertensive emergency 04/16/2020  . Hyperlipidemia 04/16/2020  . Diabetes mellitus type II, uncontrolled (HCC) 04/16/2020  . Dysphagia 04/16/2020  . Protein-calorie malnutrition (HCC) 04/16/2020  . Urinary retention 04/16/2020  . 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 Acute respiratory failure (HCC)   . Acute ischemic stroke (HCC) L MCA d/t L M1 oclusion s/p MCA stent 03/24/2020  . CVA (cerebral vascular accident) (HCC) 03/24/2020  . Middle cerebral artery embolism, left 03/24/2020    Gabriel Rung, MSOT, OTR/L 05/26/2020, 5:24 PM   [facility] [address] [place], [place], [address] Phone: [phone]   Fax:  [phone]  Name: Brad Delacruz MRN: [id number] Date of Birth: 1961/11/01

## 2020-05-27 ENCOUNTER — Ambulatory Visit (HOSPITAL_COMMUNITY): Payer: BC Managed Care – PPO | Admitting: Physical Therapy

## 2020-05-27 DIAGNOSIS — M6281 Muscle weakness (generalized): Secondary | ICD-10-CM

## 2020-05-27 DIAGNOSIS — I69820 Aphasia following other cerebrovascular disease: Secondary | ICD-10-CM | POA: Diagnosis not present

## 2020-05-27 DIAGNOSIS — R2689 Other abnormalities of gait and mobility: Secondary | ICD-10-CM | POA: Diagnosis not present

## 2020-05-27 DIAGNOSIS — I69351 Hemiplegia and hemiparesis following cerebral infarction affecting right dominant side: Secondary | ICD-10-CM | POA: Diagnosis not present

## 2020-05-27 DIAGNOSIS — I69890 Apraxia following other cerebrovascular disease: Secondary | ICD-10-CM | POA: Diagnosis not present

## 2020-05-27 DIAGNOSIS — M25511 Pain in right shoulder: Secondary | ICD-10-CM | POA: Diagnosis not present

## 2020-05-27 NOTE — Therapy (Signed)
[facility] [address] [place], [place], [address] Phone: [phone]   Fax:  [phone]  Physical Therapy Treatment  Patient Details  Name: Brad Delacruz MRN: [id number] Date of Birth: [birth year] Referring Provider (PT): Mariam Dollar    Encounter Date: 05/27/2020   PT End of Session - 05/27/20 1705    Visit Number 2    Number of Visits 24    Date for PT Re-Evaluation 07/17/20    Authorization Type BCBS no auth, no VL    Progress Note Due on Visit 10    PT Start Time 1457    PT Stop Time 1542    PT Time Calculation (min) 45 min    Activity Tolerance Patient tolerated treatment well    Behavior During Therapy [facility] for tasks assessed/performed           Past Medical History:  Diagnosis Date  . Diabetes mellitus without complication [facility])     Past Surgical History:  Procedure Laterality Date  . IR CT HEAD LTD  03/24/2020  . IR CT HEAD LTD  03/24/2020  . IR INTRA CRAN STENT  03/24/2020  . IR PATIENT EVAL TECH 0-60 MINS  03/25/2020  . IR PERCUTANEOUS ART THROMBECTOMY/INFUSION INTRACRANIAL INC DIAG ANGIO  03/24/2020  . RADIOLOGY WITH ANESTHESIA N/A 03/24/2020   Procedure: IR WITH ANESTHESIA;  Surgeon: Julieanne Cotton, MD;  [facility];  Service: Radiology;  Laterality: N/A;    There were no vitals filed for this visit.   Subjective Assessment - 05/27/20 1637    Subjective Pt without any change noted.  No pain but frustrated with lack of movement in Rt LE.  Wife states they have been working on his HEP given by OT.    Patient is accompained by: Family member;Interpreter    Currently in Pain? No/denies                             [facility] Adult PT Treatment/Exercise - 05/27/20 0001      Bed Mobility   Bed Mobility Supine to Sit;Sit to Supine    Supine to Sit Moderate Assistance - Patient 50-74%    Sit to Supine Moderate Assistance - Patient 50-74%      Transfers   Transfers Sit to Stand;Stand  to Sit;Stand Pivot Transfers    Sit to Stand 4: Min assist    Sit to Stand Details Tactile cues for weight shifting;Tactile cues for placement;Tactile cues for weight beaing;Manual facilitation for weight bearing    Stand to Sit 4: Min guard    Stand to Sit Details (indicate cue type and reason) Manual facilitation for weight shifting    Stand Pivot Transfers 4: Min guard      Knee/Hip Exercises: Seated   Other Seated Knee/Hip Exercises march, heelraise, toeraise 10 reps (unable to do on Rt)    Sit to [employer] 2 sets;5 reps;with UE support   working on [employer]     Knee/Hip Exercises: Supine   Short Arc [employer] Left;10 reps    Short [employer] Limitations unable to do on [employer] Both;10 reps                  PT Education - 05/27/20 1715    Education Details importance of completing therex, trying to move Rt LE even if can't.  Safety for completing sit to stands (doing with gait belt and with  chair behind) Given therex for HEP    Person(s) Educated Patient;Spouse    Methods Explanation;Demonstration;Tactile cues;Verbal cues;Handout    Comprehension Verbalized understanding;Returned demonstration;Verbal cues required;Tactile cues required;Need further instruction            PT Short Term Goals - 05/21/20 1829      PT SHORT TERM GOAL #1   Title Patient will be independent with initial HEP and self-management strategies to improve functional outcomes    Time 4    Period Weeks    Status New    Target Date 06/19/20      PT SHORT TERM GOAL #2   Title Patient will be able to perform stand Mod I to demonstrate improvement in functional mobility and reduced risk for falls.    Time 4    Period Weeks    Status New    Target Date 06/19/20      PT SHORT TERM GOAL #3   Title Patient will be able to maintain standing balance up to 45 sec with use of hemi walker to demo improved functional mobility.    Time 4    Period Weeks    Status New    Target Date  06/19/20             PT Long Term Goals - 05/21/20 1831      PT LONG TERM GOAL #1   Title Patient will be able to perform stand x 5 in < 60 seconds to demonstrate improvement in functional mobility and reduced risk for falls.    Time 8    Period Weeks    Status New    Target Date 07/17/20      PT LONG TERM GOAL #2   Title Patient will be able to ambulate at least 75 feet during 2MWT with LRAD to demonstrate improved ability to perform functional mobility and associated tasks.    Time 8    Period Weeks    Status New    Target Date 07/17/20      PT LONG TERM GOAL #3   Title Patient will be able to maintain standing balance up to 2 min with use of hemi walker to demo improved functional mobility.    Time 8    Period Weeks    Status New    Target Date 07/17/20                 Plan - 05/27/20 1716    Clinical Impression Statement Interpreter and wife present for session today. Began with education on logroll for supine to/from sit transfers with pt displaying general weakness but with trace AROM on Rt LE.  PT was not given a HEP so 3 exercises were given to patient and spouse to work on at home.  Pt requires max assist to stabilize Rt LE with bridge as it will flop out to the side.  Pt also displays pain when Rt LE is put into extension due to groin strain.  Instructed wife to use towel roll or pillow under knee to help support and reduce pull on groin.   Sit to stand and weight transfers completed to increase WB.  Therex completed on Lt LE with no contraction produced with Rt LE.  Educated to continue to focus and attempt movement through [place] as this will help.    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, DM, aphasia    Examination-Activity Limitations Bathing;Lift;Bed Mobility;Locomotion Level;Bend;Caring for  Others;Self Feeding;Transfers;Reach Overhead;Carry;Sit;Dressing;Hygiene/Grooming;Stairs;Squat;Stand;Toileting     Examination-Participation Restrictions Yard Work;Driving;Community Activity;Cleaning    Stability/Clinical Decision Making Evolving/Moderate complexity    Rehab Potential Fair    PT Frequency 3x / week    PT Duration 8 weeks    PT Treatment/Interventions ADLs/Self Care Home Management;Aquatic Therapy;Biofeedback;Cryotherapy;Electrical Stimulation;Contrast Bath;Therapeutic exercise;Therapeutic activities;Fluidtherapy;Parrafin;Patient/family education;Orthotic Fit/Training;Manual lymph drainage;Compression bandaging;Splinting;Taping;Vasopneumatic Device;Joint Manipulations;Spinal Manipulations;Energy conservation;Dry needling;Passive range of motion;Scar mobilization;Prosthetic Training;Wheelchair mobility training;Balance training;Neuromuscular re-education;DME Instruction;Gait training;Iontophoresis 4mg /ml Dexamethasone;Moist Heat;Stair training;Traction;Functional mobility training;Ultrasound;Cognitive remediation;Manual techniques    PT Next Visit Plan continue to progress strength and attempt mm contraction with Rt LE.  Show how to use theraband to stabilize Rt LE with bridge.    PT Home Exercise Plan 6/23:  bridge, SAQ, sit to stands    Consulted and Agree with Plan of Care Patient;Family member/caregiver    Family Member Consulted Wife           Patient will benefit from skilled therapeutic intervention in order to improve the following deficits and impairments:  Decreased balance, Difficulty walking, Impaired UE functional use, Decreased strength, Decreased range of motion, Decreased activity tolerance, Decreased mobility, Impaired sensation, Improper body mechanics, Decreased cognition, Abnormal gait, Decreased coordination  Visit Diagnosis: Other abnormalities of gait and mobility  Muscle weakness (generalized)     Problem List Patient Active Problem List   Diagnosis Date Noted  . Dysphagia, post-stroke   . Essential hypertension   . Benign essential HTN   . SAH (subarachnoid  hemorrhage) (HCC) 04/16/2020  . Cerebral edema (HCC) 04/16/2020  . Pneumonia 04/16/2020  . Hypertensive emergency 04/16/2020  . Hyperlipidemia 04/16/2020  . Diabetes mellitus type II, uncontrolled (HCC) 04/16/2020  . Dysphagia 04/16/2020  . Protein-calorie malnutrition (HCC) 04/16/2020  . Urinary retention 04/16/2020  . 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 Acute respiratory failure (HCC)   . Acute ischemic stroke (HCC) L MCA d/t L M1 oclusion s/p MCA stent 03/24/2020  . CVA (cerebral vascular accident) (HCC) 03/24/2020  . Middle cerebral artery embolism, left 03/24/2020   03/26/2020, PTA/CLT [phone]  [phone] 05/27/2020, 5:18 PM  [facility] [address] [place], [place], [place] Phone: [phone]   Fax:  [phone]  Name: Brad Delacruz MRN: Signe Colt Date of Birth: 1961-09-25

## 2020-05-29 ENCOUNTER — Encounter (HOSPITAL_COMMUNITY): Payer: Self-pay | Admitting: Specialist

## 2020-05-29 ENCOUNTER — Other Ambulatory Visit: Payer: Self-pay

## 2020-05-29 ENCOUNTER — Ambulatory Visit (HOSPITAL_COMMUNITY): Payer: BC Managed Care – PPO | Admitting: Specialist

## 2020-05-29 DIAGNOSIS — I69820 Aphasia following other cerebrovascular disease: Secondary | ICD-10-CM | POA: Diagnosis not present

## 2020-05-29 DIAGNOSIS — M6281 Muscle weakness (generalized): Secondary | ICD-10-CM | POA: Diagnosis not present

## 2020-05-29 DIAGNOSIS — M25511 Pain in right shoulder: Secondary | ICD-10-CM

## 2020-05-29 DIAGNOSIS — R2689 Other abnormalities of gait and mobility: Secondary | ICD-10-CM | POA: Diagnosis not present

## 2020-05-29 DIAGNOSIS — I69351 Hemiplegia and hemiparesis following cerebral infarction affecting right dominant side: Secondary | ICD-10-CM

## 2020-05-29 DIAGNOSIS — I69890 Apraxia following other cerebrovascular disease: Secondary | ICD-10-CM | POA: Diagnosis not present

## 2020-05-29 NOTE — Therapy (Signed)
[facility] [address] [place], [place], [address] Phone: [phone]   Fax:  [phone]  Occupational Therapy Treatment  Patient Details  Name: Brad Delacruz MRN: [id number] Date of Birth: December 11, 1960 Referring Provider (OT): Mariam Dollar, [place]    Encounter Date: 05/29/2020   OT End of Session - 05/29/20 1354    Visit Number 3    Number of Visits 24    Date for OT Re-Evaluation 08/21/20   mini reassess on 7/29   OT Start Time 1040    OT Stop Time 1115    OT Time Calculation (min) 35 min    Activity Tolerance Patient tolerated treatment well    Behavior During Therapy [facility] for tasks assessed/performed           Past Medical History:  Diagnosis Date  . Diabetes mellitus without complication [facility])     Past Surgical History:  Procedure Laterality Date  . IR CT HEAD LTD  03/24/2020  . IR CT HEAD LTD  03/24/2020  . IR INTRA CRAN STENT  03/24/2020  . IR PATIENT EVAL TECH 0-60 MINS  03/25/2020  . IR PERCUTANEOUS ART THROMBECTOMY/INFUSION INTRACRANIAL INC DIAG ANGIO  03/24/2020  . RADIOLOGY WITH ANESTHESIA N/A 03/24/2020   Procedure: IR WITH ANESTHESIA;  Surgeon: Julieanne Cotton, MD;  Location: [facility] OR;  Service: Radiology;  Laterality: N/A;    There were no vitals filed for this visit.   Subjective Assessment - 05/29/20 1353    Subjective  S:  We played uno last night - he can match the colors but he cant identify them.    Patient is accompanied by: Family member;Interpreter    Currently in Pain? Yes    Pain Score 4     Pain Location Arm    Pain Orientation Right    Pain Descriptors / Indicators Aching;Grimacing    Pain Type Acute pain    Pain Radiating Towards arm    Aggravating Factors  movement and weightbearing              [facility] OT Assessment - 05/29/20 0001      Assessment   Medical Diagnosis LT MCA     Referring Provider (OT) Mariam Dollar, PA       Precautions   Precautions Fall      Restrictions    Weight Bearing Restrictions No                    OT Treatments/Exercises (OP) - 05/29/20 0001      Transfers   Sit to Stand 4: Min assist    Sit to Stand Details (indicate cue type and reason) completed standing pivot transfer with min pa for safety transfered to left from wc to edge of mat and then mat to wc     Stand to Sit 4: Min guard    Comments see above       Neurological Re-education Exercises   Scapular Stabilization Right;20 reps;Seated    Other Information max faciliation to elevate scapula and retract scapula    Shoulder Flexion PROM;10 reps    Shoulder External Rotation PROM;10 reps    Shoulder Internal Rotation PROM;10 reps    Elbow Flexion PROM;10 reps    Elbow Extension PROM;10 reps    Forearm Supination PROM;10 reps    Forearm Pronation PROM;10 reps    Wrist Flexion PROM;10 reps    Wrist Extension PROM;10 reps    Weight Bearing Position Seated  Seated with weight on hand seated edge of mat with elbow extended, wrist extended and digits extended, therapist providing mod facilitation at elbow and min facilitation at wrist for positioning and function, patient picked up 15 beanbags with left hand positioned to left and moved to a bucket positioned to his lower right, shifting weight on and off of arm each time and then returned beanbags to his left.  completed entire sequence 2 times.  patient able to match like colors of beanbags with 100% accuracy, however unable to name color even when given 2 choices to choose from.     Development of Reach Closed chain    Closed Chain Exercises dependent with closed chain development of reach activity reaching forward again therapist hand and into scaption 10 times each with max facilitation at elbow and at wrist                     OT Short Term Goals - 05/21/20 1805      OT SHORT TERM GOAL #1   Title Patient will be educated on a HEP for ADL completion and improved RUE function.    Time 6    Period Weeks     Status New    Target Date 07/02/20      OT SHORT TERM GOAL #2   Title Paitent will complete dressing, bathing, grooming, and toileting with min pa.    Time 6    Period Weeks    Status New      OT SHORT TERM GOAL #3   Title Patient will propel wheelchair independently and safely household distances.    Time 6    Period Weeks    Status New      OT SHORT TERM GOAL #4   Title Patients vision will be assessed for deficits.    Time 6    Period Weeks    Status New      OT SHORT TERM GOAL #5   Title Patient will use RUE as a gross assist during ADL completion and transfers.    Time 6    Period Weeks    Status New      Additional Short Term Goals   Additional Short Term Goals Yes      OT SHORT TERM GOAL #6   Title Patient will weightbear on his RUE with mod  facilitation to maintain positioning while shifting weight on and off of RUE during functional tasks.    Time 6    Period Weeks    Status New             OT Long Term Goals - 05/21/20 1808      OT LONG TERM GOAL #1   Title Patient will complete B/IADLS at highest level of independence possible.    Time 12    Period Weeks    Status New    Target Date 08/13/20      OT LONG TERM GOAL #2   Title Patent will complete dressing, bathing, grooming, and toileting independently.    Time 12    Period Weeks    Status New      OT LONG TERM GOAL #3   Title Paitent will be educated and independent with compensatory techniques for visual deficits.    Time 12    Period Weeks    Status New      OT LONG TERM GOAL #4   Title Patient will use his RUE as an active assist  during ADL completion.    Time 12    Period Weeks    Status New      OT LONG TERM GOAL #5   Title Paitent will weightbear on his RUE and weightshift during ADLs independently.    Time 12    Period Weeks    Status New      Long Term Additional Goals   Additional Long Term Goals Yes      OT LONG TERM GOAL #6   Title Patient will improve tone in  RUE from Brunnstrom I to III in order to use RUE actively during adl completion.    Time 12    Period Weeks    Status New                 Plan - 05/29/20 1355    Clinical Impression Statement A:  Treatment session focused on p/rom, weightbearing during functional task.  patient tolerated weightbearing on extended hand and elbow with minimal pain.  paitent able to maintain weightbearing position with min-mod facilitation at elbow and min facilitaiton at wrist.  Paitent able to match colors of beanbags this date, however, he is not able to name colors of bean bags, even when given 2 choices.  Daughter states he spoke one sentence in [place] last night    Occupational performance deficits (Please refer to evaluation for details): ADL's;IADL's;Work;Leisure;Social Participation    Body Structure / Function / Physical Skills ADL;Decreased knowledge of use of DME;Strength;Tone;Pain;GMC;Dexterity;Balance;UE functional use;ROM;IADL;Vision;Coordination;FMC    Cognitive Skills Thought    Plan P:  Continue weightbearing during functional tasks such as scooting on edge of bed and with transfers.           Patient will benefit from skilled therapeutic intervention in order to improve the following deficits and impairments:   Body Structure / Function / Physical Skills: ADL, Decreased knowledge of use of DME, Strength, Tone, Pain, GMC, Dexterity, Balance, UE functional use, ROM, IADL, Vision, Coordination, [facility] Cognitive Skills: Thought     Visit Diagnosis: Hemiplegia and hemiparesis following cerebral infarction affecting right dominant side (HCC)  Acute pain of right shoulder    Problem List Patient Active Problem List   Diagnosis Date Noted  . Dysphagia, post-stroke   . Essential hypertension   . Benign essential HTN   . SAH (subarachnoid hemorrhage) ([place]) 04/16/2020  . Cerebral edema ([place]) 04/16/2020  . Pneumonia 04/16/2020  . Hypertensive emergency 04/16/2020  . Hyperlipidemia  04/16/2020  . Diabetes mellitus type II, uncontrolled ([place]) 04/16/2020  . Dysphagia 04/16/2020  . Protein-calorie malnutrition ([place]) 04/16/2020  . Urinary retention 04/16/2020  . 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 Acute respiratory failure ([place])   . Acute ischemic stroke ([place]) L MCA d/t L M1 oclusion s/p MCA stent 03/24/2020  . CVA (cerebral vascular accident) ([place]) 03/24/2020  . Middle cerebral artery embolism, left 03/24/2020    Vangie Bicker, [place], OTR/L [phone]  05/29/2020, 2:05 PM  [place], [place], [address] Phone: [phone]   Fax:  [phone]  Name: Stedman [place] MRN: [id number] Date of Birth: Dec 16, 1960

## 2020-06-02 DIAGNOSIS — E782 Mixed hyperlipidemia: Secondary | ICD-10-CM | POA: Diagnosis not present

## 2020-06-02 DIAGNOSIS — Z0189 Encounter for other specified special examinations: Secondary | ICD-10-CM | POA: Diagnosis not present

## 2020-06-02 DIAGNOSIS — J309 Allergic rhinitis, unspecified: Secondary | ICD-10-CM | POA: Diagnosis not present

## 2020-06-02 DIAGNOSIS — I10 Essential (primary) hypertension: Secondary | ICD-10-CM | POA: Diagnosis not present

## 2020-06-02 DIAGNOSIS — E1165 Type 2 diabetes mellitus with hyperglycemia: Secondary | ICD-10-CM | POA: Diagnosis not present

## 2020-06-03 ENCOUNTER — Other Ambulatory Visit: Payer: Self-pay

## 2020-06-03 ENCOUNTER — Encounter (HOSPITAL_COMMUNITY): Payer: Self-pay

## 2020-06-03 ENCOUNTER — Ambulatory Visit (HOSPITAL_COMMUNITY): Payer: BC Managed Care – PPO

## 2020-06-03 DIAGNOSIS — M6281 Muscle weakness (generalized): Secondary | ICD-10-CM | POA: Diagnosis not present

## 2020-06-03 DIAGNOSIS — I69351 Hemiplegia and hemiparesis following cerebral infarction affecting right dominant side: Secondary | ICD-10-CM | POA: Diagnosis not present

## 2020-06-03 DIAGNOSIS — R2689 Other abnormalities of gait and mobility: Secondary | ICD-10-CM

## 2020-06-03 DIAGNOSIS — M25511 Pain in right shoulder: Secondary | ICD-10-CM | POA: Diagnosis not present

## 2020-06-03 DIAGNOSIS — I69820 Aphasia following other cerebrovascular disease: Secondary | ICD-10-CM | POA: Diagnosis not present

## 2020-06-03 DIAGNOSIS — I69890 Apraxia following other cerebrovascular disease: Secondary | ICD-10-CM | POA: Diagnosis not present

## 2020-06-03 NOTE — Therapy (Signed)
[facility] [address] [place], [place], [address] Phone: [phone]   Fax:  [phone]  Physical Therapy Treatment  Patient Details  Name: Brad Delacruz MRN: [id number] Date of Birth: 1961-02-28 Referring Provider (PT): Mariam Dollar    Encounter Date: 06/03/2020   PT End of Session - 06/03/20 1802    Visit Number 3    Number of Visits 24    Date for PT Re-Evaluation 07/17/20    Authorization Type BCBS no auth, no VL    Progress Note Due on Visit 10    PT Start Time 1705    PT Stop Time 1752    PT Time Calculation (min) 47 min    Equipment Utilized During Treatment Gait belt    Activity Tolerance Patient tolerated treatment well;Patient limited by fatigue    Behavior During Therapy [facility] for tasks assessed/performed           Past Medical History:  Diagnosis Date  . Diabetes mellitus without complication [facility])     Past Surgical History:  Procedure Laterality Date  . IR CT HEAD LTD  03/24/2020  . IR CT HEAD LTD  03/24/2020  . IR INTRA CRAN STENT  03/24/2020  . IR PATIENT EVAL TECH 0-60 MINS  03/25/2020  . IR PERCUTANEOUS ART THROMBECTOMY/INFUSION INTRACRANIAL INC DIAG ANGIO  03/24/2020  . RADIOLOGY WITH ANESTHESIA N/A 03/24/2020   Procedure: IR WITH ANESTHESIA;  Surgeon: Julieanne Cotton, MD;  Location: [facility] OR;  Service: Radiology;  Laterality: N/A;    There were no vitals filed for this visit.   Subjective Assessment - 06/03/20 1709    Subjective Pt arrived with wife and interpreter, Loraine Leriche for session.  No reports of pain today  Has been working on sit to stands and UE exericses at home.    Patient is accompained by: Family member;Interpreter    Pertinent History LT MCA infarct 03/24/20, DM    Limitations Sitting;Lifting;Standing;Walking;House hold activities    Patient Stated Goals Patient doesnt speak    Currently in Pain? No/denies                             [facility] Adult PT Treatment/Exercise  - 06/03/20 0001      Bed Mobility   Bed Mobility Supine to Sit;Sit to Supine    Supine to Sit Moderate Assistance - Patient 50-74%   assistance required with Rt LE   Sit to Supine Moderate Assistance - Patient 50-74%   assistance required with Rt LE     Transfers   Transfers Lateral/Scoot Transfers;Sit to Stand;Stand Pivot Transfers    Sit to Stand 4: Min assist    Sit to Stand Details Tactile cues for weight shifting;Tactile cues for placement;Tactile cues for weight beaing;Manual facilitation for weight bearing    Stand to Sit 4: Min guard    Stand Pivot Transfers 4: Min guard    Lateral/Scoot Transfers 4: Min assist    Lateral/Scoot Transfer Details (indicate cue type and reason) Cueing for Lt UE positioning to assist with scooting on bed      Knee/Hip Exercises: Standing   Other Standing Knee Exercises Standing for 30" working on weight shifying      Knee/Hip Exercises: Seated   Other Seated Knee/Hip Exercises march, heelraise, toeraise 10 reps (unable to do on Rt)    Sit to [employer] 2 sets;5 reps;with UE support   working on equal stance phase  Knee/Hip Exercises: Supine   Short Arc Quad Sets Left;10 reps    Short Arc Quad Sets Limitations AAROM during RUssian with Rt LE    Bridges Both;10 reps    Bridges Limitations belt applied around thighs to stabilize Rt LE                    PT Short Term Goals - 05/21/20 1829      PT SHORT TERM GOAL #1   Title Patient will be independent with initial HEP and self-management strategies to improve functional outcomes    Time 4    Period Weeks    Status New    Target Date 06/19/20      PT SHORT TERM GOAL #2   Title Patient will be able to perform stand Mod I to demonstrate improvement in functional mobility and reduced risk for falls.    Time 4    Period Weeks    Status New    Target Date 06/19/20      PT SHORT TERM GOAL #3   Title Patient will be able to maintain standing balance up to 45 sec with use of hemi walker  to demo improved functional mobility.    Time 4    Period Weeks    Status New    Target Date 06/19/20             PT Long Term Goals - 05/21/20 1831      PT LONG TERM GOAL #1   Title Patient will be able to perform stand x 5 in < 60 seconds to demonstrate improvement in functional mobility and reduced risk for falls.    Time 8    Period Weeks    Status New    Target Date 07/17/20      PT LONG TERM GOAL #2   Title Patient will be able to ambulate at least 75 feet during with LRAD to demonstrate improved ability to perform functional mobility and associated tasks.    Time 8    Period Weeks    Status New    Target Date 07/17/20      PT LONG TERM GOAL #3   Title Patient will be able to maintain standing balance up to 2 min with use of hemi walker to demo improved functional mobility.    Time 8    Period Weeks    Status New    Target Date 07/17/20                 Plan - 06/03/20 1808    Clinical Impression Statement Interpreter and wife present for session today.  Pt educated on transfer training and trial with strengthening.  Pt educated on assistance wiht Lt LE to side Rt foot back prior standing to assist with weight bearing and balance upon standing.  [place] estim attempted iwht Rt quads wiht no contraction noted.  AAROM complete with Rt LE therex mat activiites.  Mod assistance required with scooting transfer and min A wiht sit to stand following cueing for Rt foot placement.  Pt/wife encouraged to use belt when attempting bridges at home for LE stabilization with verbalized understanding.    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, DM, aphasia    Examination-Activity Limitations Bathing;Lift;Bed Mobility;Locomotion Level;Bend;Caring for Others;Self Feeding;Transfers;Reach Overhead;Carry;Sit;Dressing;Hygiene/Grooming;Stairs;Squat;Stand;Toileting    Examination-Participation Restrictions Yard Work;Driving;Community  Activity;Cleaning    Stability/Clinical Decision Making Evolving/Moderate complexity    Clinical Decision Making Moderate  Rehab Potential Fair    PT Frequency 3x / week    PT Duration 8 weeks    PT Treatment/Interventions ADLs/Self Care Home Management;Aquatic Therapy;Biofeedback;Cryotherapy;Electrical Stimulation;Contrast Bath;Therapeutic exercise;Therapeutic activities;Fluidtherapy;Parrafin;Patient/family education;Orthotic Fit/Training;Manual lymph drainage;Compression bandaging;Splinting;Taping;Vasopneumatic Device;Joint Manipulations;Spinal Manipulations;Energy conservation;Dry needling;Passive range of motion;Scar mobilization;Prosthetic Training;Wheelchair mobility training;Balance training;Neuromuscular re-education;DME Instruction;Gait training;Iontophoresis 4mg /ml Dexamethasone;Moist Heat;Stair training;Traction;Functional mobility training;Ultrasound;Cognitive remediation;Manual techniques    PT Next Visit Plan Progress strength with focus on functional mobility and transfers. Progress to gait training with hemi walker if and when tolerated.    PT Home Exercise Plan 6/23:  bridge, SAQ, sit to stands           Patient will benefit from skilled therapeutic intervention in order to improve the following deficits and impairments:  Decreased balance, Difficulty walking, Impaired UE functional use, Decreased strength, Decreased range of motion, Decreased activity tolerance, Decreased mobility, Impaired sensation, Improper body mechanics, Decreased cognition, Abnormal gait, Decreased coordination  Visit Diagnosis: Other abnormalities of gait and mobility  Muscle weakness (generalized)     Problem List Patient Active Problem List   Diagnosis Date Noted  . Dysphagia, post-stroke   . Essential hypertension   . Benign essential HTN   . SAH (subarachnoid hemorrhage) (HCC) 04/16/2020  . Cerebral edema (HCC) 04/16/2020  . Pneumonia 04/16/2020  . Hypertensive emergency 04/16/2020  .  Hyperlipidemia 04/16/2020  . Diabetes mellitus type II, uncontrolled (HCC) 04/16/2020  . Dysphagia 04/16/2020  . Protein-calorie malnutrition (HCC) 04/16/2020  . Urinary retention 04/16/2020  . 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 Acute respiratory failure (HCC)   . Acute ischemic stroke (HCC) L MCA d/t L M1 oclusion s/p MCA stent 03/24/2020  . CVA (cerebral vascular accident) (HCC) 03/24/2020  . Middle cerebral artery embolism, left 03/24/2020   03/26/2020, LPTA/CLT; CBIS [phone]  [phone] 06/03/2020, 6:15 PM  [facility] [address] [place], [place], [place] Phone: [phone]   Fax:  [phone]  Name: Breckon Reeves MRN: Signe Colt Date of Birth: 14-Jun-1961

## 2020-06-05 ENCOUNTER — Other Ambulatory Visit: Payer: Self-pay

## 2020-06-05 ENCOUNTER — Encounter: Payer: Self-pay | Admitting: Registered Nurse

## 2020-06-05 ENCOUNTER — Ambulatory Visit (HOSPITAL_COMMUNITY): Payer: BC Managed Care – PPO | Attending: Physician Assistant | Admitting: Occupational Therapy

## 2020-06-05 DIAGNOSIS — R2689 Other abnormalities of gait and mobility: Secondary | ICD-10-CM | POA: Diagnosis not present

## 2020-06-05 DIAGNOSIS — M25511 Pain in right shoulder: Secondary | ICD-10-CM | POA: Diagnosis not present

## 2020-06-05 DIAGNOSIS — M6281 Muscle weakness (generalized): Secondary | ICD-10-CM | POA: Insufficient documentation

## 2020-06-05 DIAGNOSIS — I69351 Hemiplegia and hemiparesis following cerebral infarction affecting right dominant side: Secondary | ICD-10-CM | POA: Insufficient documentation

## 2020-06-05 NOTE — Therapy (Signed)
[facility] [address] [place], [place], [address] Phone: [phone]   Fax:  [phone]  Occupational Therapy Treatment  Patient Details  Name: Brad Delacruz MRN: [id number] Date of Birth: [birth year] Referring Provider (OT): Mariam Dollar, [place]    Encounter Date: 06/05/2020   OT End of Session - 06/05/20 1646    Visit Number 4    Number of Visits 24    Date for OT Re-Evaluation 08/21/20   mini reassess on 7/29   OT Start Time 1520    OT Stop Time 1607    OT Time Calculation (min) 47 min    Activity Tolerance Patient tolerated treatment well    Behavior During Therapy [facility] for tasks assessed/performed           Past Medical History:  Diagnosis Date  . Diabetes mellitus without complication [facility])     Past Surgical History:  Procedure Laterality Date  . IR CT HEAD LTD  03/24/2020  . IR CT HEAD LTD  03/24/2020  . IR INTRA CRAN STENT  03/24/2020  . IR PATIENT EVAL TECH 0-60 MINS  03/25/2020  . IR PERCUTANEOUS ART THROMBECTOMY/INFUSION INTRACRANIAL INC DIAG ANGIO  03/24/2020  . RADIOLOGY WITH ANESTHESIA N/A 03/24/2020   Procedure: IR WITH ANESTHESIA;  Surgeon: Julieanne Cotton, MD;  Location: [facility] OR;  Service: Radiology;  Laterality: N/A;    There were no vitals filed for this visit.   Subjective Assessment - 06/05/20 1647    Subjective  S: When asked about pain, pt pointing to upper arm around bicep.    Pertinent History Brad Delacruz was at work on 03/24/20 and had an accident, resulting in a CVA with RUE and RLE hemiparesis and expressive aphasia.  He was admitted to CIR and recieved extensive PT, OT, ST and was dc home on 05/13/20.  He has been referred to OT for evaluation and treatment.    Currently in Pain? Other (Comment)   FACE   Pain Score --   FACE 6   Pain Location Arm    Pain Orientation Right    Pain Descriptors / Indicators Aching;Grimacing    Pain Type Acute pain                        OT  Treatments/Exercises (OP) - 06/05/20 1631      Bed Mobility   Bed Mobility Supine to Sit;Sit to Supine    Supine to Sit Moderate Assistance - Patient 50-74%    Sit to Supine Moderate Assistance - Patient 50-74%      Transfers   Transfers Sit to Stand    Sit to Stand 4: Min assist    Sit to Stand Details (indicate cue type and reason) completed standing pivot transfer with min pa for safety transfered to left from wc to edge of mat and then mat to wc     Stand to Sit 4: Min guard      ADLs   UB Dressing Donning of sling with Min A for managing straps.      Exercises   Exercises Shoulder      Shoulder Exercises: Seated   Retraction 10 reps;PROM      Neurological Re-education Exercises   Scapular Stabilization 20 reps;Seated;Right    Other Information Use of anterior/posterior tilts of pelvis to manipulate trunk and facilitate scapular elevation and depression. Requiring Max A for facilitating movement.     Shoulder Flexion PROM;10 reps;Supine  Weight Bearing Position Seated    Seated with weight on forearm Support under elbow to increased stability at shoulder. Forward reach to grab bean bags with LUE while weight bearing through RUE. Five bean bags and pt matching bags to correct color bucket. Requiring Min cues for matching 2/5 color bags.       Manual Therapy   Manual Therapy Myofascial release    Manual therapy comments Manual therapy completed prior to exercises.     Myofascial Release Myofascial release and manual stretching completed to right UE (shoulder and upper arm) to decrease fascial restrictions and increase joint mobility in a pain free zone.                      OT Short Term Goals - 06/05/20 1651      OT SHORT TERM GOAL #1   Title Patient will be educated on a HEP for ADL completion and improved RUE function.    Time 6    Period Weeks    Status On-going    Target Date 07/02/20      OT SHORT TERM GOAL #2   Title Paitent will complete dressing,  bathing, grooming, and toileting with min pa.    Time 6    Period Weeks    Status On-going      OT SHORT TERM GOAL #3   Title Patient will propel wheelchair independently and safely household distances.    Time 6    Period Weeks    Status On-going      OT SHORT TERM GOAL #4   Title Patients vision will be assessed for deficits.    Time 6    Period Weeks    Status On-going      OT SHORT TERM GOAL #5   Title Patient will use RUE as a gross assist during ADL completion and transfers.    Time 6    Period Weeks    Status On-going      OT SHORT TERM GOAL #6   Title Patient will weightbear on his RUE with mod  facilitation to maintain positioning while shifting weight on and off of RUE during functional tasks.    Time 6    Period Weeks    Status On-going             OT Long Term Goals - 06/05/20 1651      OT LONG TERM GOAL #1   Title Patient will complete B/IADLS at highest level of independence possible.    Time 12    Period Weeks    Status On-going      OT LONG TERM GOAL #2   Title Patent will complete dressing, bathing, grooming, and toileting independently.    Time 12    Period Weeks    Status On-going      OT LONG TERM GOAL #3   Title Paitent will be educated and independent with compensatory techniques for visual deficits.    Time 12    Period Weeks    Status On-going      OT LONG TERM GOAL #4   Title Patient will use his RUE as an active assist during ADL completion.    Time 12    Period Weeks    Status On-going      OT LONG TERM GOAL #5   Title Paitent will weightbear on his RUE and weightshift during ADLs independently.    Time 12    Period Weeks  Status On-going      OT LONG TERM GOAL #6   Title Patient will improve tone in RUE from Brunnstrom I to III in order to use RUE actively during adl completion.    Time 12    Period Weeks    Status On-going                  Patient will benefit from skilled therapeutic intervention in  order to improve the following deficits and impairments:           Visit Diagnosis: Hemiplegia and hemiparesis following cerebral infarction affecting right dominant side (HCC)  Acute pain of right shoulder    Problem List Patient Active Problem List   Diagnosis Date Noted  . Dysphagia, post-stroke   . Essential hypertension   . Benign essential HTN   . SAH (subarachnoid hemorrhage) (HCC) 04/16/2020  . Cerebral edema (HCC) 04/16/2020  . Pneumonia 04/16/2020  . Hypertensive emergency 04/16/2020  . Hyperlipidemia 04/16/2020  . Diabetes mellitus type II, uncontrolled (HCC) 04/16/2020  . Dysphagia 04/16/2020  . Protein-calorie malnutrition (HCC) 04/16/2020  . Urinary retention 04/16/2020  . 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 Acute respiratory failure (HCC)   . Acute ischemic stroke (HCC) L MCA d/t L M1 oclusion s/p MCA stent 03/24/2020  . CVA (cerebral vascular accident) (HCC) 03/24/2020  . Middle cerebral artery embolism, left 03/24/2020    Gabriel Rung, MSOT, OTR/L 06/05/2020, 4:52 PM  [facility] [address] [place], [place], [address] Phone: [phone]   Fax:  [phone]  Name: Aundra Espin MRN: [id number] Date of Birth: 05-22-1961

## 2020-06-09 ENCOUNTER — Other Ambulatory Visit: Payer: Self-pay

## 2020-06-09 ENCOUNTER — Ambulatory Visit (HOSPITAL_COMMUNITY): Payer: BC Managed Care – PPO | Admitting: Occupational Therapy

## 2020-06-09 ENCOUNTER — Encounter (HOSPITAL_COMMUNITY): Payer: Self-pay | Admitting: Occupational Therapy

## 2020-06-09 DIAGNOSIS — I69351 Hemiplegia and hemiparesis following cerebral infarction affecting right dominant side: Secondary | ICD-10-CM | POA: Diagnosis not present

## 2020-06-09 DIAGNOSIS — M25511 Pain in right shoulder: Secondary | ICD-10-CM | POA: Diagnosis not present

## 2020-06-09 DIAGNOSIS — R2689 Other abnormalities of gait and mobility: Secondary | ICD-10-CM | POA: Diagnosis not present

## 2020-06-09 DIAGNOSIS — M6281 Muscle weakness (generalized): Secondary | ICD-10-CM | POA: Diagnosis not present

## 2020-06-09 NOTE — Therapy (Signed)
[facility] [address] [place], [place], [address] Phone: [phone]   Fax:  [phone]  Occupational Therapy Treatment  Patient Details  Name: Brad Delacruz MRN: [id number] Date of Birth: 12/23/1960 Referring Provider (OT): Mariam Dollar, [place]    Encounter Date: 06/09/2020   OT End of Session - 06/09/20 1922    Visit Number 5    Number of Visits 24    Date for OT Re-Evaluation 08/21/20   mini reassess on 7/29   Authorization Type BCBS PPO    Authorization Time Period no auth no viist limit    OT Start Time 1732    OT Stop Time 1819    OT Time Calculation (min) 47 min    Activity Tolerance Patient tolerated treatment well    Behavior During Therapy [facility] for tasks assessed/performed           Past Medical History:  Diagnosis Date  . Diabetes mellitus without complication [facility])     Past Surgical History:  Procedure Laterality Date  . IR CT HEAD LTD  03/24/2020  . IR CT HEAD LTD  03/24/2020  . IR INTRA CRAN STENT  03/24/2020  . IR PATIENT EVAL TECH 0-60 MINS  03/25/2020  . IR PERCUTANEOUS ART THROMBECTOMY/INFUSION INTRACRANIAL INC DIAG ANGIO  03/24/2020  . RADIOLOGY WITH ANESTHESIA N/A 03/24/2020   Procedure: IR WITH ANESTHESIA;  Surgeon: Julieanne Cotton, MD;  Location: [facility] OR;  Service: Radiology;  Laterality: N/A;    There were no vitals filed for this visit.   Subjective Assessment - 06/09/20 1914    Subjective  S:    Pertinent History Brad Delacruz was at work on 03/24/20 and had an accident, resulting in a CVA with RUE and RLE hemiparesis and expressive aphasia.  He was admitted to CIR and recieved extensive PT, OT, ST and was dc home on 05/13/20.  He has been referred to OT for evaluation and treatment.    Currently in Pain? Yes   FACE   Pain Score 6    FACE   Pain Location Arm    Pain Orientation Right    Pain Descriptors / Indicators Moaning;Grimacing;Guarding;Tightness                        OT  Treatments/Exercises (OP) - 06/09/20 1916      Bed Mobility   Bed Mobility Supine to Sit;Sit to Supine    Supine to Sit Moderate Assistance - Patient 50-74%;Other (comment)   Max cues for foot placement to wrap L under R   Sit to Supine Minimal Assistance - Patient > 75%;Other (comment)   Max cues for wrapping L foot under R ankle     Transfers   Transfers Sit to Stand    Sit to Stand 4: Min guard    Stand to Sit 4: Min guard      Neurological Re-education Exercises   Scapular Stabilization 20 reps;Supine;Seated    Other Information In supine, faciltating scapular movement. While in sitting, use of anterior/posterior tilts of pelvis to manipulate trunk and facilitate scapular elevation and depression. Requiring Max A for facilitating movement of scapula    Shoulder Flexion PROM;10 reps;Supine    Weight Bearing Position Seated    Seated with weight on forearm Support under elbow and wrist to increased stability at shoulder. Forward reach to grab cones with LUE while weight bearing through RUE. Six cones and pt matching bags to correct color dot. Requiring Min  cues for matching 1/5 color dot. Pt continues to grimace with weight bearing exercises      Manual Therapy   Manual Therapy Myofascial release    Manual therapy comments Manual therapy completed prior to exercises.     Myofascial Release Myofascial release and manual stretching completed to right UE (shoulder and upper arm) to decrease fascial restrictions and increase joint mobility in a pain free zone.                    OT Education - 06/09/20 1921    Education Details Discussed use of givmohr sling for shoulder support and to possibly reduce pain.    Person(s) Educated Patient;Spouse;Child(ren)    Methods Explanation;Demonstration;Handout    Comprehension Verbalized understanding            OT Short Term Goals - 06/05/20 1651      OT SHORT TERM GOAL #1   Title Patient will be educated on a HEP for ADL completion  and improved RUE function.    Time 6    Period Weeks    Status On-going    Target Date 07/02/20      OT SHORT TERM GOAL #2   Title Paitent will complete dressing, bathing, grooming, and toileting with min pa.    Time 6    Period Weeks    Status On-going      OT SHORT TERM GOAL #3   Title Patient will propel wheelchair independently and safely household distances.    Time 6    Period Weeks    Status On-going      OT SHORT TERM GOAL #4   Title Patients vision will be assessed for deficits.    Time 6    Period Weeks    Status On-going      OT SHORT TERM GOAL #5   Title Patient will use RUE as a gross assist during ADL completion and transfers.    Time 6    Period Weeks    Status On-going      OT SHORT TERM GOAL #6   Title Patient will weightbear on his RUE with mod  facilitation to maintain positioning while shifting weight on and off of RUE during functional tasks.    Time 6    Period Weeks    Status On-going             OT Long Term Goals - 06/05/20 1651      OT LONG TERM GOAL #1   Title Patient will complete B/IADLS at highest level of independence possible.    Time 12    Period Weeks    Status On-going      OT LONG TERM GOAL #2   Title Patent will complete dressing, bathing, grooming, and toileting independently.    Time 12    Period Weeks    Status On-going      OT LONG TERM GOAL #3   Title Paitent will be educated and independent with compensatory techniques for visual deficits.    Time 12    Period Weeks    Status On-going      OT LONG TERM GOAL #4   Title Patient will use his RUE as an active assist during ADL completion.    Time 12    Period Weeks    Status On-going      OT LONG TERM GOAL #5   Title Paitent will weightbear on his RUE and weightshift during ADLs independently.  Time 12    Period Weeks    Status On-going      OT LONG TERM GOAL #6   Title Patient will improve tone in RUE from Brunnstrom I to III in order to use RUE  actively during adl completion.    Time 12    Period Weeks    Status On-going                 Plan - 06/09/20 1924    Clinical Impression Statement A: Starting session with manual therapy and myofascial release at R shoulder, upper arm, and traps to increase ROM and decrease pain. While at EOB, using anterior/posterior tilt to promote scapular movement; pt requiring Max A for facilitating scapular depression/elevation. Pt performing forward reach with LUE while weight bearing through RUE into therapist's hand (requiting elbow support to stabilize shoulder). Providing cues throughout for form and technique. Educating pt and family on use of givmohr sling to support shoulder; family and pt willing to try it.    Body Structure / Function / Physical Skills ADL;Decreased knowledge of use of DME;Strength;Tone;Pain;GMC;Dexterity;Balance;UE functional use;ROM;IADL;Vision;Coordination;FMC    Plan P: Continue myofasical release, scapular ROM, and weight bearing. Order givmohr sling.    OT Home Exercise Plan eval: weighbearing, functional adl completion    Consulted and Agree with Plan of Care Patient;Family member/caregiver    Family Member Consulted wife [name]) and daughter [name])           Patient will benefit from skilled therapeutic intervention in order to improve the following deficits and impairments:   Body Structure / Function / Physical Skills: ADL, Decreased knowledge of use of DME, Strength, Tone, Pain, GMC, Dexterity, Balance, UE functional use, ROM, IADL, Vision, Coordination, [facility]       Visit Diagnosis: Hemiplegia and hemiparesis following cerebral infarction affecting right dominant side (HCC)  Acute pain of right shoulder    Problem List Patient Active Problem List   Diagnosis Date Noted  . Dysphagia, post-stroke   . Essential hypertension   . Benign essential HTN   . SAH (subarachnoid hemorrhage) (HCC) 04/16/2020  . Cerebral edema (HCC) 04/16/2020  . Pneumonia  04/16/2020  . Hypertensive emergency 04/16/2020  . Hyperlipidemia 04/16/2020  . Diabetes mellitus type II, uncontrolled (HCC) 04/16/2020  . Dysphagia 04/16/2020  . Protein-calorie malnutrition (HCC) 04/16/2020  . Urinary retention 04/16/2020  . 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 Acute respiratory failure (HCC)   . Acute ischemic stroke (HCC) L MCA d/t L M1 oclusion s/p MCA stent 03/24/2020  . CVA (cerebral vascular accident) (HCC) 03/24/2020  . Middle cerebral artery embolism, left 03/24/2020    Gabriel Rung, MSOT, OTR/L 06/09/2020, 7:29 PM  [facility] [address] [place], [place], [address] Phone: [phone]   Fax:  [phone]  Name: Brad Delacruz MRN: [id number] Date of Birth: 1961-09-16

## 2020-06-10 ENCOUNTER — Encounter (HOSPITAL_COMMUNITY): Payer: Self-pay | Admitting: Physical Therapy

## 2020-06-10 ENCOUNTER — Ambulatory Visit (HOSPITAL_COMMUNITY): Payer: BC Managed Care – PPO | Admitting: Physical Therapy

## 2020-06-10 DIAGNOSIS — R2689 Other abnormalities of gait and mobility: Secondary | ICD-10-CM | POA: Diagnosis not present

## 2020-06-10 DIAGNOSIS — M6281 Muscle weakness (generalized): Secondary | ICD-10-CM

## 2020-06-10 DIAGNOSIS — M25511 Pain in right shoulder: Secondary | ICD-10-CM | POA: Diagnosis not present

## 2020-06-10 DIAGNOSIS — I69351 Hemiplegia and hemiparesis following cerebral infarction affecting right dominant side: Secondary | ICD-10-CM | POA: Diagnosis not present

## 2020-06-10 NOTE — Therapy (Signed)
[facility] [address] [place], [place], [address] Phone: [phone]   Fax:  [phone]  Physical Therapy Treatment  Patient Details  Name: Brad Delacruz MRN: [id number] Date of Birth: Oct 21, 1961 Referring Provider (PT): Mariam Dollar    Encounter Date: 06/10/2020   PT End of Session - 06/10/20 1349    Visit Number 4    Number of Visits 24    Date for PT Re-Evaluation 07/17/20    Authorization Type BCBS no auth, no VL    Progress Note Due on Visit 10    PT Start Time 1349    PT Stop Time 1430    PT Time Calculation (min) 41 min    Equipment Utilized During Treatment Gait belt    Activity Tolerance Patient tolerated treatment well;Patient limited by fatigue    Behavior During Therapy [facility] for tasks assessed/performed           Past Medical History:  Diagnosis Date  . Diabetes mellitus without complication [facility])     Past Surgical History:  Procedure Laterality Date  . IR CT HEAD LTD  03/24/2020  . IR CT HEAD LTD  03/24/2020  . IR INTRA CRAN STENT  03/24/2020  . IR PATIENT EVAL TECH 0-60 MINS  03/25/2020  . IR PERCUTANEOUS ART THROMBECTOMY/INFUSION INTRACRANIAL INC DIAG ANGIO  03/24/2020  . RADIOLOGY WITH ANESTHESIA N/A 03/24/2020   Procedure: IR WITH ANESTHESIA;  Surgeon: Julieanne Cotton, MD;  Location: [facility] OR;  Service: Radiology;  Laterality: N/A;    There were no vitals filed for this visit.   Subjective Assessment - 06/10/20 1355    Subjective Patients wife reports no new issues, says she thinks he is getting a little better with transfers. Still unable to walk.    Patient is accompained by: Family member;Interpreter    Pertinent History LT MCA infarct 03/24/20, DM    Limitations Sitting;Lifting;Standing;Walking;House hold activities    Patient Stated Goals Patient doesnt speak    Currently in Pain? No/denies                             [facility] Adult PT Treatment/Exercise - 06/10/20 0001       Bed Mobility   Bed Mobility Supine to Sit;Sit to Supine;Rolling Right;Rolling Left    Rolling Right Contact Guard/Touching assist    Rolling Left Moderate Assistance - Patient 50-74%    Supine to Sit Moderate Assistance - Patient 50-74%    Sit to Supine Minimal Assistance - Patient > 75%      Transfers   Sit to Stand 4: Min guard    Stand Pivot Transfers 4: Min guard      Ambulation/Gait   Ambulation/Gait Yes    Ambulation/Gait Assistance 2: Max assist    Ambulation Distance (Feet) 5 Feet    Assistive device Hemi-walker    Gait Pattern Step-to pattern;Decreased arm swing - right;Decreased arm swing - left;Decreased step length - right;Decreased step length - left;Decreased stride length;Decreased hip/knee flexion - right;Decreased dorsiflexion - right;Decreased weight shift to right    Ambulation Surface Level;Indoor                    PT Short Term Goals - 05/21/20 1829      PT SHORT TERM GOAL #1   Title Patient will be independent with initial HEP and self-management strategies to improve functional outcomes    Time 4    Period Weeks  Status New    Target Date 06/19/20      PT SHORT TERM GOAL #2   Title Patient will be able to perform stand Mod I to demonstrate improvement in functional mobility and reduced risk for falls.    Time 4    Period Weeks    Status New    Target Date 06/19/20      PT SHORT TERM GOAL #3   Title Patient will be able to maintain standing balance up to 45 sec with use of hemi walker to demo improved functional mobility.    Time 4    Period Weeks    Status New    Target Date 06/19/20             PT Long Term Goals - 05/21/20 1831      PT LONG TERM GOAL #1   Title Patient will be able to perform stand x 5 in < 60 seconds to demonstrate improvement in functional mobility and reduced risk for falls.    Time 8    Period Weeks    Status New    Target Date 07/17/20      PT LONG TERM GOAL #2   Title Patient will be able to  ambulate at least 75 feet during with LRAD to demonstrate improved ability to perform functional mobility and associated tasks.    Time 8    Period Weeks    Status New    Target Date 07/17/20      PT LONG TERM GOAL #3   Title Patient will be able to maintain standing balance up to 2 min with use of hemi walker to demo improved functional mobility.    Time 8    Period Weeks    Status New    Target Date 07/17/20                 Plan - 06/10/20 1514    Clinical Impression Statement Patient continues to be limited by significant RT sided weakness. Worked on bed mobility and functional mobility transfers today. Patient cued on body mechanics and positioning to improved efficacy of movements. Patient requires assist for RUE and RLE positioning with RT and LT rolling in bed. Patient preforms fairly well with standing balance using hemi walker so attempted gait training. Patient with significant difficulty sequencing hemi walker and LEs, and required Max A for RLE placement with stepping. Patient able to walk about 6 feet before legs giving out due to fatigue. Returned to [facility] with therapist assist. Patient will continue to benefit from skilled therapy services to progress strength and tolerance to functional activity to improve functional mobility.    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, DM, aphasia    Examination-Activity Limitations Bathing;Lift;Bed Mobility;Locomotion Level;Bend;Caring for Others;Self Feeding;Transfers;Reach Overhead;Carry;Sit;Dressing;Hygiene/Grooming;Stairs;Squat;Stand;Toileting    Examination-Participation Restrictions Yard Work;Driving;Community Activity;Cleaning    Stability/Clinical Decision Making Evolving/Moderate complexity    Rehab Potential Fair    PT Frequency 3x / week    PT Duration 8 weeks    PT Treatment/Interventions ADLs/Self Care Home Management;Aquatic Therapy;Biofeedback;Cryotherapy;Electrical  Stimulation;Contrast Bath;Therapeutic exercise;Therapeutic activities;Fluidtherapy;Parrafin;Patient/family education;Orthotic Fit/Training;Manual lymph drainage;Compression bandaging;Splinting;Taping;Vasopneumatic Device;Joint Manipulations;Spinal Manipulations;Energy conservation;Dry needling;Passive range of motion;Scar mobilization;Prosthetic Training;Wheelchair mobility training;Balance training;Neuromuscular re-education;DME Instruction;Gait training;Iontophoresis 4mg /ml Dexamethasone;Moist Heat;Stair training;Traction;Functional mobility training;Ultrasound;Cognitive remediation;Manual techniques    PT Next Visit Plan Progress strength with focus on functional mobility and transfers. Progress to gait training with hemi walker if tolerated.    PT Home Exercise Plan  6/23:  bridge, SAQ, sit to stands    Consulted and Agree with Plan of Care Patient;Family member/caregiver    Family Member Consulted Wife           Patient will benefit from skilled therapeutic intervention in order to improve the following deficits and impairments:  Decreased balance, Difficulty walking, Impaired UE functional use, Decreased strength, Decreased range of motion, Decreased activity tolerance, Decreased mobility, Impaired sensation, Improper body mechanics, Decreased cognition, Abnormal gait, Decreased coordination  Visit Diagnosis: Other abnormalities of gait and mobility  Muscle weakness (generalized)     Problem List Patient Active Problem List   Diagnosis Date Noted  . Dysphagia, post-stroke   . Essential hypertension   . Benign essential HTN   . SAH (subarachnoid hemorrhage) (HCC) 04/16/2020  . Cerebral edema (HCC) 04/16/2020  . Pneumonia 04/16/2020  . Hypertensive emergency 04/16/2020  . Hyperlipidemia 04/16/2020  . Diabetes mellitus type II, uncontrolled (HCC) 04/16/2020  . Dysphagia 04/16/2020  . Protein-calorie malnutrition (HCC) 04/16/2020  . Urinary retention 04/16/2020  . Left middle  cerebral artery stroke (HCC) 04/16/2020  . Acute respiratory failure (HCC)   . Acute ischemic stroke (HCC) L MCA d/t L M1 oclusion s/p MCA stent 03/24/2020  . CVA (cerebral vascular accident) (HCC) 03/24/2020  . Middle cerebral artery embolism, left 03/24/2020   3:22 PM, 06/10/20 Georges Lynch PT DPT  Physical Therapist with [facility]  [facility]  [phone]   [facility] [address] [place], [place], [address] Phone: [phone]   Fax:  [phone]  Name: Januel Doolan MRN: [id number] Date of Birth: 10/06/1961

## 2020-06-11 ENCOUNTER — Other Ambulatory Visit: Payer: Self-pay

## 2020-06-11 ENCOUNTER — Ambulatory Visit (HOSPITAL_COMMUNITY): Payer: BC Managed Care – PPO | Admitting: Physical Therapy

## 2020-06-11 ENCOUNTER — Encounter (HOSPITAL_COMMUNITY): Payer: Self-pay | Admitting: Occupational Therapy

## 2020-06-11 ENCOUNTER — Ambulatory Visit (HOSPITAL_COMMUNITY): Payer: BC Managed Care – PPO | Admitting: Occupational Therapy

## 2020-06-11 ENCOUNTER — Encounter (HOSPITAL_COMMUNITY): Payer: Self-pay | Admitting: Physical Therapy

## 2020-06-11 DIAGNOSIS — M25511 Pain in right shoulder: Secondary | ICD-10-CM

## 2020-06-11 DIAGNOSIS — R2689 Other abnormalities of gait and mobility: Secondary | ICD-10-CM

## 2020-06-11 DIAGNOSIS — M6281 Muscle weakness (generalized): Secondary | ICD-10-CM

## 2020-06-11 DIAGNOSIS — I63512 Cerebral infarction due to unspecified occlusion or stenosis of left middle cerebral artery: Secondary | ICD-10-CM | POA: Diagnosis not present

## 2020-06-11 DIAGNOSIS — I69351 Hemiplegia and hemiparesis following cerebral infarction affecting right dominant side: Secondary | ICD-10-CM

## 2020-06-11 NOTE — Therapy (Signed)
[facility] [address] [place], [place], [address] Phone: [phone]   Fax:  [phone]  Occupational Therapy Treatment  Patient Details  Name: Brad Delacruz MRN: [id number] Date of Birth: Jun 16, 1961 Referring Provider (OT): Mariam Dollar, [place]    Encounter Date: 06/11/2020   OT End of Session - 06/11/20 1846    Visit Number 6    Number of Visits 24    Date for OT Re-Evaluation 08/21/20   mini reassess on 7/29   Authorization Type BCBS PPO    Authorization Time Period no auth no viist limit    OT Start Time 1729    OT Stop Time 1825    OT Time Calculation (min) 56 min    Activity Tolerance Patient tolerated treatment well    Behavior During Therapy [facility] for tasks assessed/performed           Past Medical History:  Diagnosis Date  . Diabetes mellitus without complication [facility])     Past Surgical History:  Procedure Laterality Date  . IR CT HEAD LTD  03/24/2020  . IR CT HEAD LTD  03/24/2020  . IR INTRA CRAN STENT  03/24/2020  . IR PATIENT EVAL TECH 0-60 MINS  03/25/2020  . IR PERCUTANEOUS ART THROMBECTOMY/INFUSION INTRACRANIAL INC DIAG ANGIO  03/24/2020  . RADIOLOGY WITH ANESTHESIA N/A 03/24/2020   Procedure: IR WITH ANESTHESIA;  Surgeon: Julieanne Cotton, MD;  Location: [facility] OR;  Service: Radiology;  Laterality: N/A;    There were no vitals filed for this visit.   Subjective Assessment - 06/11/20 1837    Subjective  S: Agreeable to try out K-tapping for subluxation    Pertinent History Mr. Lagace was at work on 03/24/20 and had an accident, resulting in a CVA with RUE and RLE hemiparesis and expressive aphasia.  He was admitted to CIR and recieved extensive PT, OT, ST and was dc home on 05/13/20.  He has been referred to OT for evaluation and treatment.    Currently in Pain? Yes    Pain Score 4    face   Pain Location Shoulder    Pain Orientation Right    Pain Descriptors / Indicators Aching                         OT Treatments/Exercises (OP) - 06/11/20 1839      Bed Mobility   Bed Mobility Supine to Sit;Sit to Supine    Supine to Sit Other (comment);Minimal Assistance - Patient > 75%   Max cues for foot placement to wrap L under R   Sit to Supine Minimal Assistance - Patient > 75%;Other (comment)   Max cues for wrapping L foot under R ankle     Transfers   Transfers Sit to Stand    Sit to Stand 4: Min guard    Stand to Sit 4: Min guard      Exercises   Exercises Shoulder      Neurological Re-education Exercises   Shoulder Flexion PROM;10 reps;Supine   Max support at scapula   Weight Bearing Position --    Seated with weight on forearm --      Manual Therapy   Manual Therapy Myofascial release;Scapular mobilization;Passive ROM;Taping    Manual therapy comments Manual therapy completed prior to exercises.     Myofascial Release Myofascial release and manual stretching completed to right UE (shoulder and upper arm) to decrease fascial restrictions and increase joint mobility in  a pain free zone.      Scapular Mobilization abduction/abbuction and elevation/depression of scapula while in supine.    Passive ROM With Max support at RUE and elbow in flexed position. Support at scapula. Pt reporting slight pain but not as bad a last session. forward flexion at shoulder 10x    Kinesiotex Facilitate Muscle   Decrease subluxation                   OT Short Term Goals - 06/05/20 1651      OT SHORT TERM GOAL #1   Title Patient will be educated on a HEP for ADL completion and improved RUE function.    Time 6    Period Weeks    Status On-going    Target Date 07/02/20      OT SHORT TERM GOAL #2   Title Paitent will complete dressing, bathing, grooming, and toileting with min pa.    Time 6    Period Weeks    Status On-going      OT SHORT TERM GOAL #3   Title Patient will propel wheelchair independently and safely household distances.    Time 6    Period  Weeks    Status On-going      OT SHORT TERM GOAL #4   Title Patients vision will be assessed for deficits.    Time 6    Period Weeks    Status On-going      OT SHORT TERM GOAL #5   Title Patient will use RUE as a gross assist during ADL completion and transfers.    Time 6    Period Weeks    Status On-going      OT SHORT TERM GOAL #6   Title Patient will weightbear on his RUE with mod  facilitation to maintain positioning while shifting weight on and off of RUE during functional tasks.    Time 6    Period Weeks    Status On-going             OT Long Term Goals - 06/05/20 1651      OT LONG TERM GOAL #1   Title Patient will complete B/IADLS at highest level of independence possible.    Time 12    Period Weeks    Status On-going      OT LONG TERM GOAL #2   Title Patent will complete dressing, bathing, grooming, and toileting independently.    Time 12    Period Weeks    Status On-going      OT LONG TERM GOAL #3   Title Paitent will be educated and independent with compensatory techniques for visual deficits.    Time 12    Period Weeks    Status On-going      OT LONG TERM GOAL #4   Title Patient will use his RUE as an active assist during ADL completion.    Time 12    Period Weeks    Status On-going      OT LONG TERM GOAL #5   Title Paitent will weightbear on his RUE and weightshift during ADLs independently.    Time 12    Period Weeks    Status On-going      OT LONG TERM GOAL #6   Title Patient will improve tone in RUE from [place]unnstrom I to III in order to use RUE actively during adl completion.    Time 12    Period Weeks  Status On-going                 Plan - 06/11/20 1846    Clinical Impression Statement A: Starting session with manual therapy and myofascial release at R shoulder, upper arm, and traps to increase ROM and decrease pain. Performing PROM in supine with Max support at scapula and elbow. Pt reporting slight pain but not as painful  as prior sessions. Facilitating scapular movement while in supine. Due to pt's increased pain and subluxation (one finger width), initiated K-tapping at shoulder for increased support and decreased pain. Further education on need for support at elbow anytime as well as benefits of Givmohr sling. Daughter and family verbalized understanding.    Body Structure / Function / Physical Skills ADL;Decreased knowledge of use of DME;Strength;Tone;Pain;GMC;Dexterity;Balance;UE functional use;ROM;IADL;Vision;Coordination;FMC    Plan P: Continue myofasical release, scapular ROM, and weight bearing. Review K-tapping with patient and family. Order givmohr sling.    OT Home Exercise Plan eval: weighbearing, functional adl completion    Consulted and Agree with Plan of Care Patient;Family member/caregiver    Family Member Consulted wife [name]) and daughter [name])           Patient will benefit from skilled therapeutic intervention in order to improve the following deficits and impairments:   Body Structure / Function / Physical Skills: ADL, Decreased knowledge of use of DME, Strength, Tone, Pain, GMC, Dexterity, Balance, UE functional use, ROM, IADL, Vision, Coordination, [facility]       Visit Diagnosis: Hemiplegia and hemiparesis following cerebral infarction affecting right dominant side (HCC)  Acute pain of right shoulder    Problem List Patient Active Problem List   Diagnosis Date Noted  . Dysphagia, post-stroke   . Essential hypertension   . Benign essential HTN   . SAH (subarachnoid hemorrhage) (HCC) 04/16/2020  . Cerebral edema (HCC) 04/16/2020  . Pneumonia 04/16/2020  . Hypertensive emergency 04/16/2020  . Hyperlipidemia 04/16/2020  . Diabetes mellitus type II, uncontrolled (HCC) 04/16/2020  . Dysphagia 04/16/2020  . Protein-calorie malnutrition (HCC) 04/16/2020  . Urinary retention 04/16/2020  . 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 Acute respiratory failure (HCC)   .  Acute ischemic stroke (HCC) L MCA d/t L M1 oclusion s/p MCA stent 03/24/2020  . CVA (cerebral vascular accident) (HCC) 03/24/2020  . Middle cerebral artery embolism, left 03/24/2020    Gabriel Rung, MSOT, OTR/L 06/11/2020, 6:52 PM  [facility] [address] [place], [place], [address] Phone: [phone]   Fax:  [phone]  Name: Curvin Hunger MRN: [id number] Date of Birth: November 06, 1961

## 2020-06-11 NOTE — Therapy (Signed)
[facility] [address] [place], [place], [address] Phone: [phone]   Fax:  [phone]  Physical Therapy Treatment  Patient Details  Name: Brad Delacruz MRN: [id number] Date of Birth: Apr 17, 1961 Referring Provider (PT): Mariam Dollar    Encounter Date: 06/11/2020   PT End of Session - 06/11/20 1000    Visit Number 5    Number of Visits 24    Date for PT Re-Evaluation 07/17/20    Authorization Type BCBS no auth, no VL    Progress Note Due on Visit 10    PT Start Time 0915    PT Stop Time 0955    PT Time Calculation (min) 40 min    Equipment Utilized During Treatment Gait belt    Activity Tolerance Patient tolerated treatment well;Patient limited by fatigue    Behavior During Therapy [facility] for tasks assessed/performed           Past Medical History:  Diagnosis Date  . Diabetes mellitus without complication [facility])     Past Surgical History:  Procedure Laterality Date  . IR CT HEAD LTD  03/24/2020  . IR CT HEAD LTD  03/24/2020  . IR INTRA CRAN STENT  03/24/2020  . IR PATIENT EVAL TECH 0-60 MINS  03/25/2020  . IR PERCUTANEOUS ART THROMBECTOMY/INFUSION INTRACRANIAL INC DIAG ANGIO  03/24/2020  . RADIOLOGY WITH ANESTHESIA N/A 03/24/2020   Procedure: IR WITH ANESTHESIA;  Surgeon: Julieanne Cotton, MD;  Location: [facility] OR;  Service: Radiology;  Laterality: N/A;    There were no vitals filed for this visit.   Subjective Assessment - 06/11/20 0923    Subjective PT is does not speak English .    Currently in Pain? Yes    Pain Score 5     Pain Location Leg    Pain Orientation Right    Pain Descriptors / Indicators Aching    Pain Type Acute pain    Pain Onset More than a month ago                             [facility] Adult PT Treatment/Exercise - 06/11/20 0001      Bed Mobility   Bed Mobility Supine to Sit;Sit to Supine;Rolling Right;Rolling Left    Rolling Right Contact Guard/Touching assist   x10   Rolling  Left Moderate Assistance - Patient 50-74%   x10   Supine to Sit Moderate Assistance - Patient 50-74%    Sit to Supine Minimal Assistance - Patient > 75%      Transfers   Sit to Stand 4: Min guard    Stand Pivot Transfers 4: Min guard      Exercises   Exercises Knee/Hip      Knee/Hip Exercises: Standing   Other Standing Knee Exercises standing with wt shifting       Knee/Hip Exercises: Seated   Long Arc Quad Right;5 reps    Long Arc Quad Limitations PROM with therapist tapping to attempt mm contraction     Other Seated Knee/Hip Exercises march, heelraise, toeraise 10 reps (unable to do on Rt)    Sit to [employer] 10 reps      Knee/Hip Exercises: Supine   Bridges Both;10 reps                    PT Short Term Goals - 05/21/20 1829      PT SHORT TERM GOAL #1   Title Patient  will be independent with initial HEP and self-management strategies to improve functional outcomes    Time 4    Period Weeks    Status New    Target Date 06/19/20      PT SHORT TERM GOAL #2   Title Patient will be able to perform stand Mod I to demonstrate improvement in functional mobility and reduced risk for falls.    Time 4    Period Weeks    Status New    Target Date 06/19/20      PT SHORT TERM GOAL #3   Title Patient will be able to maintain standing balance up to 45 sec with use of hemi walker to demo improved functional mobility.    Time 4    Period Weeks    Status New    Target Date 06/19/20             PT Long Term Goals - 05/21/20 1831      PT LONG TERM GOAL #1   Title Patient will be able to perform stand x 5 in < 60 seconds to demonstrate improvement in functional mobility and reduced risk for falls.    Time 8    Period Weeks    Status New    Target Date 07/17/20      PT LONG TERM GOAL #2   Title Patient will be able to ambulate at least 75 feet during with LRAD to demonstrate improved ability to perform functional mobility and associated tasks.    Time 8    Period  Weeks    Status New    Target Date 07/17/20      PT LONG TERM GOAL #3   Title Patient will be able to maintain standing balance up to 2 min with use of hemi walker to demo improved functional mobility.    Time 8    Period Weeks    Status New    Target Date 07/17/20                 Plan - 06/11/20 1001    Clinical Impression Statement PT Rt LE remains flacid for the majority of muscles, he does have a trace of hip extension.  Noted significant weakness of Lt LE as well, therefore pt will benefit from Lt LE strengthening.  Therapist worked on sit to stand as well as wt shifting, ambulation is not recommended until pt gains both mm strength and control in his Rt LE.    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, DM, aphasia    Examination-Activity Limitations Bathing;Lift;Bed Mobility;Locomotion Level;Bend;Caring for Others;Self Feeding;Transfers;Reach Overhead;Carry;Sit;Dressing;Hygiene/Grooming;Stairs;Squat;Stand;Toileting    Examination-Participation Restrictions Yard Work;Driving;Community Activity;Cleaning    Stability/Clinical Decision Making Evolving/Moderate complexity    Rehab Potential Fair    PT Frequency 3x / week    PT Duration 8 weeks    PT Treatment/Interventions ADLs/Self Care Home Management;Aquatic Therapy;Biofeedback;Cryotherapy;Electrical Stimulation;Contrast Bath;Therapeutic exercise;Therapeutic activities;Fluidtherapy;Parrafin;Patient/family education;Orthotic Fit/Training;Manual lymph drainage;Compression bandaging;Splinting;Taping;Vasopneumatic Device;Joint Manipulations;Spinal Manipulations;Energy conservation;Dry needling;Passive range of motion;Scar mobilization;Prosthetic Training;Wheelchair mobility training;Balance training;Neuromuscular re-education;DME Instruction;Gait training;Iontophoresis 4mg /ml Dexamethasone;Moist Heat;Stair training;Traction;Functional mobility training;Ultrasound;Cognitive  remediation;Manual techniques    PT Next Visit Plan Progress strength with focus on functional mobility and transfers.    PT Home Exercise Plan 6/23:  bridge, SAQ, sit to stands    Consulted and Agree with Plan of Care Patient;Family member/caregiver    Family Member Consulted Wife           Patient will benefit  from skilled therapeutic intervention in order to improve the following deficits and impairments:  Decreased balance, Difficulty walking, Impaired UE functional use, Decreased strength, Decreased range of motion, Decreased activity tolerance, Decreased mobility, Impaired sensation, Improper body mechanics, Decreased cognition, Abnormal gait, Decreased coordination  Visit Diagnosis: Muscle weakness (generalized)  Hemiplegia and hemiparesis following cerebral infarction affecting right dominant side (HCC)  Other abnormalities of gait and mobility     Problem List Patient Active Problem List   Diagnosis Date Noted  . Dysphagia, post-stroke   . Essential hypertension   . Benign essential HTN   . SAH (subarachnoid hemorrhage) (HCC) 04/16/2020  . Cerebral edema (HCC) 04/16/2020  . Pneumonia 04/16/2020  . Hypertensive emergency 04/16/2020  . Hyperlipidemia 04/16/2020  . Diabetes mellitus type II, uncontrolled (HCC) 04/16/2020  . Dysphagia 04/16/2020  . Protein-calorie malnutrition (HCC) 04/16/2020  . Urinary retention 04/16/2020  . 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 Acute respiratory failure (HCC)   . Acute ischemic stroke (HCC) L MCA d/t L M1 oclusion s/p MCA stent 03/24/2020  . CVA (cerebral vascular accident) (HCC) 03/24/2020  . Middle cerebral artery embolism, left 03/24/2020   Virgina Organ, PT CLT [phone] 06/11/2020, 10:04 AM  [facility] [address] [place], [place], [address] Phone: [phone]   Fax:  [phone]  Name: Brad Delacruz MRN: [id number] Date of Birth: 01-Jan-1961

## 2020-06-12 ENCOUNTER — Ambulatory Visit (HOSPITAL_COMMUNITY): Payer: BC Managed Care – PPO

## 2020-06-12 ENCOUNTER — Encounter (HOSPITAL_COMMUNITY): Payer: Self-pay

## 2020-06-12 DIAGNOSIS — M6281 Muscle weakness (generalized): Secondary | ICD-10-CM

## 2020-06-12 DIAGNOSIS — I63512 Cerebral infarction due to unspecified occlusion or stenosis of left middle cerebral artery: Secondary | ICD-10-CM | POA: Diagnosis not present

## 2020-06-12 DIAGNOSIS — R2689 Other abnormalities of gait and mobility: Secondary | ICD-10-CM | POA: Diagnosis not present

## 2020-06-12 DIAGNOSIS — I69351 Hemiplegia and hemiparesis following cerebral infarction affecting right dominant side: Secondary | ICD-10-CM | POA: Diagnosis not present

## 2020-06-12 DIAGNOSIS — M25511 Pain in right shoulder: Secondary | ICD-10-CM | POA: Diagnosis not present

## 2020-06-12 NOTE — Therapy (Signed)
[facility] [address] [place], [place], [address] Phone: [phone]   Fax:  [phone]  Physical Therapy Treatment  Patient Details  Name: Brad Delacruz MRN: [id number] Date of Birth: 11-13-1961 Referring Provider (PT): Mariam Dollar    Encounter Date: 06/12/2020   PT End of Session - 06/12/20 1558    Visit Number 6    Number of Visits 24    Date for PT Re-Evaluation 07/17/20    Authorization Type BCBS no auth, no VL    Progress Note Due on Visit 10    PT Start Time 1450    PT Stop Time 1530    PT Time Calculation (min) 40 min    Equipment Utilized During Treatment Gait belt    Activity Tolerance Patient tolerated treatment well;Patient limited by fatigue    Behavior During Therapy [facility] for tasks assessed/performed           Past Medical History:  Diagnosis Date  . Diabetes mellitus without complication [facility]     Past Surgical History:  Procedure Laterality Date  . IR CT HEAD LTD  03/24/2020  . IR CT HEAD LTD  03/24/2020  . IR INTRA CRAN STENT  03/24/2020  . IR PATIENT EVAL TECH 0-60 MINS  03/25/2020  . IR PERCUTANEOUS ART THROMBECTOMY/INFUSION INTRACRANIAL INC DIAG ANGIO  03/24/2020  . RADIOLOGY WITH ANESTHESIA N/A 03/24/2020   Procedure: IR WITH ANESTHESIA;  Surgeon: Julieanne Cotton, MD;  Location: [facility] OR;  Service: Radiology;  Laterality: N/A;    There were no vitals filed for this visit.   Subjective Assessment - 06/12/20 1453    Subjective Pt did not speak much during session, "yes" "No" to answer questions.  Interpreter stated he feels good today, no reports of pain today.                             [facility] Adult PT Treatment/Exercise - 06/12/20 0001      Transfers   Transfers Lateral/Scoot Transfers;Squat Pivot Transfers;Sit to Stand    Sit to Stand 4: Min guard    Sit to Stand Details Tactile cues for weight shifting;Tactile cues for placement;Tactile cues for weight beaing;Manual  facilitation for weight bearing    Comments Cueing for Rt foot placement prior standing      Exercises   Exercises Knee/Hip      Knee/Hip Exercises: Standing   Other Standing Knee Exercises standing with wt shifting       Knee/Hip Exercises: Seated   Long Arc Quad Left;AROM;10 reps;Right;AAROM    Long Arc Quad Limitations PROM with therapist tapping to attempt mm contraction     Other Seated Knee/Hip Exercises march, heelraise, toeraise 10 reps (unable to do on Rt)    Sit to [employer] 10 reps   cueing for appropriate handplacement                    PT Short Term Goals - 05/21/20 1829      PT SHORT TERM GOAL #1   Title Patient will be independent with initial HEP and self-management strategies to improve functional outcomes    Time 4    Period Weeks    Status New    Target Date 06/19/20      PT SHORT TERM GOAL #2   Title Patient will be able to perform stand Mod I to demonstrate improvement in functional mobility and reduced risk for falls.  Time 4    Period Weeks    Status New    Target Date 06/19/20      PT SHORT TERM GOAL #3   Title Patient will be able to maintain standing balance up to 45 sec with use of hemi walker to demo improved functional mobility.    Time 4    Period Weeks    Status New    Target Date 06/19/20             PT Long Term Goals - 05/21/20 1831      PT LONG TERM GOAL #1   Title Patient will be able to perform stand x 5 in < 60 seconds to demonstrate improvement in functional mobility and reduced risk for falls.    Time 8    Period Weeks    Status New    Target Date 07/17/20      PT LONG TERM GOAL #2   Title Patient will be able to ambulate at least 75 feet during with LRAD to demonstrate improved ability to perform functional mobility and associated tasks.    Time 8    Period Weeks    Status New    Target Date 07/17/20      PT LONG TERM GOAL #3   Title Patient will be able to maintain standing balance up to 2 min with  use of hemi walker to demo improved functional mobility.    Time 8    Period Weeks    Status New    Target Date 07/17/20                 Plan - 06/12/20 1846    Clinical Impression Statement Rt LE remains flacid for the majority of musculature, trace of hip extension.  Pt educated on manual positioning of Rt LE prior standing to improve weight bearing wiht standing.  Able to stand for 1' prior fatigue and request to sit.  Session focus on STS, weight shifting and transfer training wiht min A and max A with Rt positioning.    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, DM, aphasia    Examination-Activity Limitations Bathing;Lift;Bed Mobility;Locomotion Level;Bend;Caring for Others;Self Feeding;Transfers;Reach Overhead;Carry;Sit;Dressing;Hygiene/Grooming;Stairs;Squat;Stand;Toileting    Examination-Participation Restrictions Yard Work;Driving;Community Activity;Cleaning    Stability/Clinical Decision Making Evolving/Moderate complexity    Clinical Decision Making Moderate    Rehab Potential Fair    PT Frequency 3x / week    PT Duration 8 weeks    PT Treatment/Interventions ADLs/Self Care Home Management;Aquatic Therapy;Biofeedback;Cryotherapy;Electrical Stimulation;Contrast Bath;Therapeutic exercise;Therapeutic activities;Fluidtherapy;Parrafin;Patient/family education;Orthotic Fit/Training;Manual lymph drainage;Compression bandaging;Splinting;Taping;Vasopneumatic Device;Joint Manipulations;Spinal Manipulations;Energy conservation;Dry needling;Passive range of motion;Scar mobilization;Prosthetic Training;Wheelchair mobility training;Balance training;Neuromuscular re-education;DME Instruction;Gait training;Iontophoresis 4mg /ml Dexamethasone;Moist Heat;Stair training;Traction;Functional mobility training;Ultrasound;Cognitive remediation;Manual techniques    PT Next Visit Plan Progress strength with focus on functional mobility and transfers.    PT  Home Exercise Plan 6/23:  bridge, SAQ, sit to stands    Consulted and Agree with Plan of Care Patient;Family member/caregiver;Other (Comment)   interpreter   Family Member Consulted Wife           Patient will benefit from skilled therapeutic intervention in order to improve the following deficits and impairments:  Decreased balance, Difficulty walking, Impaired UE functional use, Decreased strength, Decreased range of motion, Decreased activity tolerance, Decreased mobility, Impaired sensation, Improper body mechanics, Decreased cognition, Abnormal gait, Decreased coordination  Visit Diagnosis: Muscle weakness (generalized)  Other abnormalities of gait and mobility     Problem  List Patient Active Problem List   Diagnosis Date Noted  . Dysphagia, post-stroke   . Essential hypertension   . Benign essential HTN   . SAH (subarachnoid hemorrhage) (HCC) 04/16/2020  . Cerebral edema (HCC) 04/16/2020  . Pneumonia 04/16/2020  . Hypertensive emergency 04/16/2020  . Hyperlipidemia 04/16/2020  . Diabetes mellitus type II, uncontrolled (HCC) 04/16/2020  . Dysphagia 04/16/2020  . Protein-calorie malnutrition (HCC) 04/16/2020  . Urinary retention 04/16/2020  . 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 Acute respiratory failure (HCC)   . Acute ischemic stroke (HCC) L MCA d/t L M1 oclusion s/p MCA stent 03/24/2020  . CVA (cerebral vascular accident) (HCC) 03/24/2020  . Middle cerebral artery embolism, left 03/24/2020   Becky Sax, LPTA/CLT; CBIS [phone]  Juel Burrow 06/12/2020, 6:50 PM  [facility] [address] [place], [place], [address] Phone: [phone]   Fax:  [phone]  Name: Brad Delacruz MRN: [id number] Date of Birth: 1961-09-15

## 2020-06-15 ENCOUNTER — Encounter (HOSPITAL_COMMUNITY): Payer: Self-pay | Admitting: Physical Therapy

## 2020-06-15 ENCOUNTER — Other Ambulatory Visit: Payer: Self-pay

## 2020-06-15 ENCOUNTER — Ambulatory Visit (HOSPITAL_COMMUNITY): Payer: BC Managed Care – PPO | Admitting: Physical Therapy

## 2020-06-15 DIAGNOSIS — M25511 Pain in right shoulder: Secondary | ICD-10-CM | POA: Diagnosis not present

## 2020-06-15 DIAGNOSIS — I69351 Hemiplegia and hemiparesis following cerebral infarction affecting right dominant side: Secondary | ICD-10-CM | POA: Diagnosis not present

## 2020-06-15 DIAGNOSIS — R2689 Other abnormalities of gait and mobility: Secondary | ICD-10-CM

## 2020-06-15 DIAGNOSIS — M6281 Muscle weakness (generalized): Secondary | ICD-10-CM

## 2020-06-16 ENCOUNTER — Encounter (HOSPITAL_COMMUNITY): Payer: Self-pay | Admitting: Occupational Therapy

## 2020-06-16 ENCOUNTER — Ambulatory Visit (HOSPITAL_COMMUNITY): Payer: BC Managed Care – PPO | Admitting: Occupational Therapy

## 2020-06-16 ENCOUNTER — Telehealth (HOSPITAL_COMMUNITY): Payer: Self-pay

## 2020-06-16 DIAGNOSIS — M25511 Pain in right shoulder: Secondary | ICD-10-CM | POA: Diagnosis not present

## 2020-06-16 DIAGNOSIS — R2689 Other abnormalities of gait and mobility: Secondary | ICD-10-CM | POA: Diagnosis not present

## 2020-06-16 DIAGNOSIS — M6281 Muscle weakness (generalized): Secondary | ICD-10-CM | POA: Diagnosis not present

## 2020-06-16 DIAGNOSIS — I69351 Hemiplegia and hemiparesis following cerebral infarction affecting right dominant side: Secondary | ICD-10-CM

## 2020-06-16 NOTE — Telephone Encounter (Signed)
Called to schedule f/u cta head/neck, no answer, left vm. AW 

## 2020-06-16 NOTE — Therapy (Signed)
[facility] [address] [place], [place], [address] Phone: [phone]   Fax:  [phone]  Physical Therapy Treatment  Patient Details  Name: Brad Delacruz MRN: [id number] Date of Birth: [birth year] Referring Provider (PT): Mariam Dollar    Encounter Date: 06/15/2020   PT End of Session - 06/15/20 1526    Visit Number 7    Number of Visits 24    Date for PT Re-Evaluation 07/17/20    Authorization Type BCBS no auth, no VL    Progress Note Due on Visit 10    PT Start Time 1520    PT Stop Time 1555    PT Time Calculation (min) 35 min    Equipment Utilized During Treatment Gait belt    Activity Tolerance Patient tolerated treatment well;Patient limited by fatigue    Behavior During Therapy [facility] for tasks assessed/performed           Past Medical History:  Diagnosis Date  . Diabetes mellitus without complication [facility])     Past Surgical History:  Procedure Laterality Date  . IR CT HEAD LTD  03/24/2020  . IR CT HEAD LTD  03/24/2020  . IR INTRA CRAN STENT  03/24/2020  . IR PATIENT EVAL TECH 0-60 MINS  03/25/2020  . IR PERCUTANEOUS ART THROMBECTOMY/INFUSION INTRACRANIAL INC DIAG ANGIO  03/24/2020  . RADIOLOGY WITH ANESTHESIA N/A 03/24/2020   Procedure: IR WITH ANESTHESIA;  Surgeon: Julieanne Cotton, MD;  Location: [facility] OR;  Service: Radiology;  Laterality: N/A;    There were no vitals filed for this visit.   Subjective Assessment - 06/15/20 1524    Subjective Patient reports no new issues since last visit.    Patient is accompained by: Family member    Limitations Sitting;Lifting;Standing;Walking;House hold activities    Currently in Pain? No/denies                             [facility] Adult PT Treatment/Exercise - 06/16/20 0001      Knee/Hip Exercises: Standing   Gait Training gait in // bars, 1RT using LT hand, max A for RLE, rest break x 1       Knee/Hip Exercises: Seated   Long Arc Quad Left;1 set;15  reps    Marching Left;1 set;15 reps    Sit to [employer] 2 sets;5 reps;with UE support                    PT Short Term Goals - 05/21/20 1829      PT SHORT TERM GOAL #1   Title Patient will be independent with initial HEP and self-management strategies to improve functional outcomes    Time 4    Period Weeks    Status New    Target Date 06/19/20      PT SHORT TERM GOAL #2   Title Patient will be able to perform stand Mod I to demonstrate improvement in functional mobility and reduced risk for falls.    Time 4    Period Weeks    Status New    Target Date 06/19/20      PT SHORT TERM GOAL #3   Title Patient will be able to maintain standing balance up to 45 sec with use of hemi walker to demo improved functional mobility.    Time 4    Period Weeks    Status New    Target Date 06/19/20  PT Long Term Goals - 05/21/20 1831      PT LONG TERM GOAL #1   Title Patient will be able to perform stand x 5 in < 60 seconds to demonstrate improvement in functional mobility and reduced risk for falls.    Time 8    Period Weeks    Status New    Target Date 07/17/20      PT LONG TERM GOAL #2   Title Patient will be able to ambulate at least 75 feet during with LRAD to demonstrate improved ability to perform functional mobility and associated tasks.    Time 8    Period Weeks    Status New    Target Date 07/17/20      PT LONG TERM GOAL #3   Title Patient will be able to maintain standing balance up to 2 min with use of hemi walker to demo improved functional mobility.    Time 8    Period Weeks    Status New    Target Date 07/17/20                 Plan - 06/16/20 0831    Clinical Impression Statement Patient continues to be limited by RT side weakness which is limiting functional mobility. Patient with some improved return for seated strengthening exercise with LLE, but continues to require mod verbal and tactile cues to differentiate leg lift with  marching versus LAQs. Patient cued on improved eccentric control with LEs during sit to stands. Patient shows improved weight shifting during gait training in parallel bars but continues to require max assist for movement of RLE. Patient with noted fatigue post treatment. Patient will continue to benefit from skilled therapy services to progress functional strengthening.    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, DM, aphasia    Examination-Activity Limitations Bathing;Lift;Bed Mobility;Locomotion Level;Bend;Caring for Others;Self Feeding;Transfers;Reach Overhead;Carry;Sit;Dressing;Hygiene/Grooming;Stairs;Squat;Stand;Toileting    Examination-Participation Restrictions Yard Work;Driving;Community Activity;Cleaning    Stability/Clinical Decision Making Evolving/Moderate complexity    Rehab Potential Fair    PT Frequency 3x / week    PT Duration 8 weeks    PT Treatment/Interventions ADLs/Self Care Home Management;Aquatic Therapy;Biofeedback;Cryotherapy;Electrical Stimulation;Contrast Bath;Therapeutic exercise;Therapeutic activities;Fluidtherapy;Parrafin;Patient/family education;Orthotic Fit/Training;Manual lymph drainage;Compression bandaging;Splinting;Taping;Vasopneumatic Device;Joint Manipulations;Spinal Manipulations;Energy conservation;Dry needling;Passive range of motion;Scar mobilization;Prosthetic Training;Wheelchair mobility training;Balance training;Neuromuscular re-education;DME Instruction;Gait training;Iontophoresis 4mg /ml Dexamethasone;Moist Heat;Stair training;Traction;Functional mobility training;Ultrasound;Cognitive remediation;Manual techniques    PT Next Visit Plan Progress strength with focus on functional mobility and transfers.    PT Home Exercise Plan 6/23:  bridge, SAQ, sit to stands    Consulted and Agree with Plan of Care Patient;Family member/caregiver;Other (Comment)   interpreter   Family Member Consulted Wife            Patient will benefit from skilled therapeutic intervention in order to improve the following deficits and impairments:  Decreased balance, Difficulty walking, Impaired UE functional use, Decreased strength, Decreased range of motion, Decreased activity tolerance, Decreased mobility, Impaired sensation, Improper body mechanics, Decreased cognition, Abnormal gait, Decreased coordination  Visit Diagnosis: Muscle weakness (generalized)  Other abnormalities of gait and mobility     Problem List Patient Active Problem List   Diagnosis Date Noted  . Dysphagia, post-stroke   . Essential hypertension   . Benign essential HTN   . SAH (subarachnoid hemorrhage) (HCC) 04/16/2020  . Cerebral edema (HCC) 04/16/2020  . Pneumonia 04/16/2020  . Hypertensive emergency 04/16/2020  . Hyperlipidemia 04/16/2020  . Diabetes mellitus type II, uncontrolled (HCC)  04/16/2020  . Dysphagia 04/16/2020  . Protein-calorie malnutrition (HCC) 04/16/2020  . Urinary retention 04/16/2020  . 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 Acute respiratory failure (HCC)   . Acute ischemic stroke (HCC) L MCA d/t L M1 oclusion s/p MCA stent 03/24/2020  . CVA (cerebral vascular accident) (HCC) 03/24/2020  . Middle cerebral artery embolism, left 03/24/2020    9:00 AM, 06/16/20 Georges Lynch PT DPT  Physical Therapist with [place]  [phone]   [facility] [address] [place], [place], [address] Phone: [phone]   Fax:  [phone]  Name: Brad Delacruz MRN: [id number] Date of Birth: 05-Jul-1961

## 2020-06-16 NOTE — Therapy (Signed)
[facility] [address] [place], [place], [address] Phone: [phone]   Fax:  [phone]  Occupational Therapy Treatment  Patient Details  Name: Mallie Linnemann MRN: [id number] Date of Birth: [birth year] Referring Provider (OT): Mariam Dollar, [place]    Encounter Date: 06/16/2020   OT End of Session - 06/16/20 1938    Visit Number 7    Number of Visits 24    Date for OT Re-Evaluation 08/21/20   mini reassess on 7/29   Authorization Type BCBS PPO    Authorization Time Period no auth no viist limit    OT Start Time 1731    OT Stop Time 1829    OT Time Calculation (min) 58 min    Activity Tolerance Patient tolerated treatment well    Behavior During Therapy [facility] for tasks assessed/performed           Past Medical History:  Diagnosis Date  . Diabetes mellitus without complication [facility])     Past Surgical History:  Procedure Laterality Date  . IR CT HEAD LTD  03/24/2020  . IR CT HEAD LTD  03/24/2020  . IR INTRA CRAN STENT  03/24/2020  . IR PATIENT EVAL TECH 0-60 MINS  03/25/2020  . IR PERCUTANEOUS ART THROMBECTOMY/INFUSION INTRACRANIAL INC DIAG ANGIO  03/24/2020  . RADIOLOGY WITH ANESTHESIA N/A 03/24/2020   Procedure: IR WITH ANESTHESIA;  Surgeon: Julieanne Cotton, MD;  Location: [facility] OR;  Service: Radiology;  Laterality: N/A;    There were no vitals filed for this visit.   Subjective Assessment - 06/16/20 1936    Subjective  S: Wife reporting that she felt K tap helped support arm    Pertinent History Mr. Henrichs was at work on 03/24/20 and had an accident, resulting in a CVA with RUE and RLE hemiparesis and expressive aphasia.  He was admitted to CIR and recieved extensive PT, OT, ST and was dc home on 05/13/20.  He has been referred to OT for evaluation and treatment.    Currently in Pain? --   face   Pain Score 6     Pain Location Shoulder    Pain Orientation Right    Pain Descriptors / Indicators Aching;Sharp                         OT Treatments/Exercises (OP) - 06/16/20 1937      Bed Mobility   Bed Mobility Supine to Sit;Sit to Supine    Supine to Sit Moderate Assistance - Patient 50-74%    Sit to Supine Minimal Assistance - Patient > 75%;Other (comment)   Max cues for wrapping L foot under R ankle     Transfers   Transfers Sit to Stand    Sit to Stand 4: Min guard    Stand to Sit 4: Min guard      Exercises   Exercises Shoulder      Neurological Re-education Exercises   Shoulder Flexion PROM;10 reps;Supine   Max support at scapula   Weight Bearing Position Seated    Seated with weight on forearm Support provided at elbow and pt reaching forward to get daughter Verlee Monte) a high five. 5x      Manual Therapy   Manual Therapy Myofascial release;Scapular mobilization;Passive ROM;Taping    Manual therapy comments Manual therapy completed prior to exercises.     Myofascial Release Myofascial release and manual stretching completed to right UE (shoulder and upper arm) to decrease fascial restrictions  and increase joint mobility in a pain free zone.      Scapular Mobilization abduction/abbuction and elevation/depression of scapula while in supine.    Passive ROM With Max support at RUE and elbow in flexed position. Support at scapula. Pt reporting slight pain but not as bad a last session. forward flexion at shoulder 10x    Kinesiotex Facilitate Muscle   Decrease subluxation                   OT Short Term Goals - 06/05/20 1651      OT SHORT TERM GOAL #1   Title Patient will be educated on a HEP for ADL completion and improved RUE function.    Time 6    Period Weeks    Status On-going    Target Date 07/02/20      OT SHORT TERM GOAL #2   Title Paitent will complete dressing, bathing, grooming, and toileting with min pa.    Time 6    Period Weeks    Status On-going      OT SHORT TERM GOAL #3   Title Patient will propel wheelchair independently and safely household  distances.    Time 6    Period Weeks    Status On-going      OT SHORT TERM GOAL #4   Title Patients vision will be assessed for deficits.    Time 6    Period Weeks    Status On-going      OT SHORT TERM GOAL #5   Title Patient will use RUE as a gross assist during ADL completion and transfers.    Time 6    Period Weeks    Status On-going      OT SHORT TERM GOAL #6   Title Patient will weightbear on his RUE with mod  facilitation to maintain positioning while shifting weight on and off of RUE during functional tasks.    Time 6    Period Weeks    Status On-going             OT Long Term Goals - 06/05/20 1651      OT LONG TERM GOAL #1   Title Patient will complete B/IADLS at highest level of independence possible.    Time 12    Period Weeks    Status On-going      OT LONG TERM GOAL #2   Title Patent will complete dressing, bathing, grooming, and toileting independently.    Time 12    Period Weeks    Status On-going      OT LONG TERM GOAL #3   Title Paitent will be educated and independent with compensatory techniques for visual deficits.    Time 12    Period Weeks    Status On-going      OT LONG TERM GOAL #4   Title Patient will use his RUE as an active assist during ADL completion.    Time 12    Period Weeks    Status On-going      OT LONG TERM GOAL #5   Title Paitent will weightbear on his RUE and weightshift during ADLs independently.    Time 12    Period Weeks    Status On-going      OT LONG TERM GOAL #6   Title Patient will improve tone in RUE from Brunnstrom I to III in order to use RUE actively during adl completion.    Time 12  Period Weeks    Status On-going                 Plan - 06/16/20 1939    Clinical Impression Statement A: DUring bed mobility from EOB to supine, pt with poor motor planning to manage RLE with LLE as performed in prior sessions. Pt also with significant pain with LLE after bed mobility and required increased time  for rest and allow muscle to relax; also transitioning LLE into flexed position for relief. Starting session with manual therapy and myofascial release at R shoulder, upper arm, and traps to increase ROM and decrease pain. Performing PROM in supine with Max support at scapula and elbow. Pt reporting slight pain but not as painful as prior sessions. Facilitating scapular movement while in supine. Due to pt's increased pain and subluxation (one finger width), reapplied K-tapping at shoulder for increased support and decreased pain. Weight bearing with support at elbow while pt  reached forward to give daughter high five. Further education on need for support at elbow anytime as well as benefits of Givmohr sling. Daughter Verlee Monte) and family verbalized understanding.    Body Structure / Function / Physical Skills ADL;Decreased knowledge of use of DME;Strength;Tone;Pain;GMC;Dexterity;Balance;UE functional use;ROM;IADL;Vision;Coordination;FMC    Plan P: Continue myofasical release, scapular ROM, and weight bearing. Review K-tapping with patient and family. Order givmohr sling.    OT Home Exercise Plan eval: weighbearing, functional adl completion    Consulted and Agree with Plan of Care Patient;Family member/caregiver    Family Member Consulted wife [name]) and daughter [name])           Patient will benefit from skilled therapeutic intervention in order to improve the following deficits and impairments:   Body Structure / Function / Physical Skills: ADL, Decreased knowledge of use of DME, Strength, Tone, Pain, GMC, Dexterity, Balance, UE functional use, ROM, IADL, Vision, Coordination, [facility]       Visit Diagnosis: Hemiplegia and hemiparesis following cerebral infarction affecting right dominant side (HCC)  Acute pain of right shoulder    Problem List Patient Active Problem List   Diagnosis Date Noted  . Dysphagia, post-stroke   . Essential hypertension   . Benign essential HTN   . SAH  (subarachnoid hemorrhage) (HCC) 04/16/2020  . Cerebral edema (HCC) 04/16/2020  . Pneumonia 04/16/2020  . Hypertensive emergency 04/16/2020  . Hyperlipidemia 04/16/2020  . Diabetes mellitus type II, uncontrolled (HCC) 04/16/2020  . Dysphagia 04/16/2020  . Protein-calorie malnutrition (HCC) 04/16/2020  . Urinary retention 04/16/2020  . 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 Acute respiratory failure (HCC)   . Acute ischemic stroke (HCC) L MCA d/t L M1 oclusion s/p MCA stent 03/24/2020  . CVA (cerebral vascular accident) (HCC) 03/24/2020  . Middle cerebral artery embolism, left 03/24/2020    Gabriel Rung, MSOT, OTR/L 06/16/2020, 7:43 PM  [facility] [address] [place], [place], [address] Phone: [phone]   Fax:  [phone]  Name: Renne Platts MRN: [id number] Date of Birth: [birth year]

## 2020-06-17 ENCOUNTER — Encounter (HOSPITAL_COMMUNITY): Payer: Self-pay

## 2020-06-17 ENCOUNTER — Other Ambulatory Visit: Payer: Self-pay

## 2020-06-17 ENCOUNTER — Ambulatory Visit (HOSPITAL_COMMUNITY): Payer: BC Managed Care – PPO

## 2020-06-17 DIAGNOSIS — M25511 Pain in right shoulder: Secondary | ICD-10-CM | POA: Diagnosis not present

## 2020-06-17 DIAGNOSIS — R2689 Other abnormalities of gait and mobility: Secondary | ICD-10-CM

## 2020-06-17 DIAGNOSIS — I69351 Hemiplegia and hemiparesis following cerebral infarction affecting right dominant side: Secondary | ICD-10-CM | POA: Diagnosis not present

## 2020-06-17 DIAGNOSIS — M6281 Muscle weakness (generalized): Secondary | ICD-10-CM | POA: Diagnosis not present

## 2020-06-17 NOTE — Therapy (Signed)
[facility] [address] [place], [place], [address] Phone: [phone]   Fax:  [phone]  Physical Therapy Treatment  Patient Details  Name: Brad Delacruz MRN: [id number] Date of Birth: [birth year] Referring Provider (PT): Mariam Dollar    Encounter Date: 06/17/2020   PT End of Session - 06/17/20 1603    Visit Number 8    Number of Visits 24    Date for PT Re-Evaluation 07/17/20    Authorization Type BCBS no auth, no VL    Progress Note Due on Visit 10    PT Start Time 1530    PT Stop Time 1610    PT Time Calculation (min) 40 min    Equipment Utilized During Treatment Gait belt    Activity Tolerance Patient tolerated treatment well;Patient limited by fatigue    Behavior During Therapy [facility] for tasks assessed/performed           Past Medical History:  Diagnosis Date  . Diabetes mellitus without complication [facility])     Past Surgical History:  Procedure Laterality Date  . IR CT HEAD LTD  03/24/2020  . IR CT HEAD LTD  03/24/2020  . IR INTRA CRAN STENT  03/24/2020  . IR PATIENT EVAL TECH 0-60 MINS  03/25/2020  . IR PERCUTANEOUS ART THROMBECTOMY/INFUSION INTRACRANIAL INC DIAG ANGIO  03/24/2020  . RADIOLOGY WITH ANESTHESIA N/A 03/24/2020   Procedure: IR WITH ANESTHESIA;  Surgeon: Julieanne Cotton, MD;  Location: [facility] OR;  Service: Radiology;  Laterality: N/A;    There were no vitals filed for this visit.   Subjective Assessment - 06/17/20 1534    Subjective Pt arrived with wife and interpreter.  Shaked his head when asked if any pain.    Patient is accompained by: Family member;Interpreter    Pertinent History LT MCA infarct 03/24/20, DM    Patient Stated Goals Patient doesnt speak    Currently in Pain? No/denies                             [facility] Adult PT Treatment/Exercise - 06/17/20 0001      Transfers   Sit to Stand 4: Min guard    Sit to Stand Details Visual cues/gestures for  precautions/safety;Verbal cues for sequencing;Verbal cues for precautions/safety;Verbal cues for technique      Knee/Hip Exercises: Standing   Functional Squat 2 sets;5 reps    Gait Training gait in // bars, 1RT using LT hand, max A for RLE, rest break x 1     Other Standing Knee Exercises standing with wt shifting     Other Standing Knee Exercises Lt foot onto 4in step x 2 max A and therapist blocking Rt knee       Knee/Hip Exercises: Seated   Long Arc Quad Left;1 set;15 reps    Long Arc Quad Limitations AAROM with Lt LE lifting Rt    Marching Left;1 set;15 reps    Sit to [employer] 2 sets;5 reps;with UE support   cueing for hand placement                   PT Short Term Goals - 05/21/20 1829      PT SHORT TERM GOAL #1   Title Patient will be independent with initial HEP and self-management strategies to improve functional outcomes    Time 4    Period Weeks    Status New    Target Date 06/19/20  PT SHORT TERM GOAL #2   Title Patient will be able to perform stand Mod I to demonstrate improvement in functional mobility and reduced risk for falls.    Time 4    Period Weeks    Status New    Target Date 06/19/20      PT SHORT TERM GOAL #3   Title Patient will be able to maintain standing balance up to 45 sec with use of hemi walker to demo improved functional mobility.    Time 4    Period Weeks    Status New    Target Date 06/19/20             PT Long Term Goals - 05/21/20 1831      PT LONG TERM GOAL #1   Title Patient will be able to perform stand x 5 in < 60 seconds to demonstrate improvement in functional mobility and reduced risk for falls.    Time 8    Period Weeks    Status New    Target Date 07/17/20      PT LONG TERM GOAL #2   Title Patient will be able to ambulate at least 75 feet during with LRAD to demonstrate improved ability to perform functional mobility and associated tasks.    Time 8    Period Weeks    Status New    Target Date  07/17/20      PT LONG TERM GOAL #3   Title Patient will be able to maintain standing balance up to 2 min with use of hemi walker to demo improved functional mobility.    Time 8    Period Weeks    Status New    Target Date 07/17/20                 Plan - 06/17/20 1614    Clinical Impression Statement Rt LE remains flacid wiht max A for all movements.  Pt continues to require multimodal cuieng to improve foot placement prior standing and constant cueing for appropriate UE placement to assist with standing and safety.  Improved weight shifting during gait but does required max PROM for Rt LE movement.  Pt with minimal words, did answer questions no and shook head for yes.  Noted he was limited by fatigue at EOS.    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, DM, aphasia    Examination-Activity Limitations Bathing;Lift;Bed Mobility;Locomotion Level;Bend;Caring for Others;Self Feeding;Transfers;Reach Overhead;Carry;Sit;Dressing;Hygiene/Grooming;Stairs;Squat;Stand;Toileting    Examination-Participation Restrictions Yard Work;Driving;Community Activity;Cleaning    Clinical Decision Making Moderate    Rehab Potential Fair    PT Frequency 3x / week    PT Duration 8 weeks    PT Treatment/Interventions ADLs/Self Care Home Management;Aquatic Therapy;Biofeedback;Cryotherapy;Electrical Stimulation;Contrast Bath;Therapeutic exercise;Therapeutic activities;Fluidtherapy;Parrafin;Patient/family education;Orthotic Fit/Training;Manual lymph drainage;Compression bandaging;Splinting;Taping;Vasopneumatic Device;Joint Manipulations;Spinal Manipulations;Energy conservation;Dry needling;Passive range of motion;Scar mobilization;Prosthetic Training;Wheelchair mobility training;Balance training;Neuromuscular re-education;DME Instruction;Gait training;Iontophoresis 4mg /ml Dexamethasone;Moist Heat;Stair training;Traction;Functional mobility training;Ultrasound;Cognitive  remediation;Manual techniques    PT Next Visit Plan Progress strength with focus on functional mobility and transfers.    PT Home Exercise Plan 6/23:  bridge, SAQ, sit to stands           Patient will benefit from skilled therapeutic intervention in order to improve the following deficits and impairments:  Decreased balance, Difficulty walking, Impaired UE functional use, Decreased strength, Decreased range of motion, Decreased activity tolerance, Decreased mobility, Impaired sensation, Improper body mechanics, Decreased cognition, Abnormal gait, Decreased coordination  Visit Diagnosis: Muscle  weakness (generalized)  Other abnormalities of gait and mobility     Problem List Patient Active Problem List   Diagnosis Date Noted  . Dysphagia, post-stroke   . Essential hypertension   . Benign essential HTN   . SAH (subarachnoid hemorrhage) (HCC) 04/16/2020  . Cerebral edema (HCC) 04/16/2020  . Pneumonia 04/16/2020  . Hypertensive emergency 04/16/2020  . Hyperlipidemia 04/16/2020  . Diabetes mellitus type II, uncontrolled (HCC) 04/16/2020  . Dysphagia 04/16/2020  . Protein-calorie malnutrition (HCC) 04/16/2020  . Urinary retention 04/16/2020  . 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 Acute respiratory failure (HCC)   . Acute ischemic stroke (HCC) L MCA d/t L M1 oclusion s/p MCA stent 03/24/2020  . CVA (cerebral vascular accident) (HCC) 03/24/2020  . Middle cerebral artery embolism, left 03/24/2020   Becky Sax, LPTA/CLT; CBIS [phone]  Juel Burrow 06/17/2020, 4:20 PM  [facility] [address] [place], [place], [address] Phone: [phone]   Fax:  [phone]  Name: Brad Delacruz MRN: [id number] Date of Birth: Jul 06, 1961

## 2020-06-18 ENCOUNTER — Encounter (HOSPITAL_COMMUNITY): Payer: Self-pay | Admitting: Occupational Therapy

## 2020-06-18 ENCOUNTER — Ambulatory Visit (HOSPITAL_COMMUNITY): Payer: BC Managed Care – PPO | Admitting: Occupational Therapy

## 2020-06-18 DIAGNOSIS — M25511 Pain in right shoulder: Secondary | ICD-10-CM

## 2020-06-18 DIAGNOSIS — I69351 Hemiplegia and hemiparesis following cerebral infarction affecting right dominant side: Secondary | ICD-10-CM

## 2020-06-18 DIAGNOSIS — M6281 Muscle weakness (generalized): Secondary | ICD-10-CM | POA: Diagnosis not present

## 2020-06-18 DIAGNOSIS — R2689 Other abnormalities of gait and mobility: Secondary | ICD-10-CM | POA: Diagnosis not present

## 2020-06-18 NOTE — Therapy (Signed)
[facility] [address] [place], [place], [address] Phone: [phone]   Fax:  [phone]  Occupational Therapy Treatment  Patient Details  Name: Brad Delacruz MRN: [id number] Date of Birth: 10-16-1961 Referring Provider (OT): Brad Delacruz, [place]    Encounter Date: 06/18/2020   OT End of Session - 06/18/20 1906    Visit Number 8    Number of Visits 24    Date for OT Re-Evaluation 08/21/20   mini reassess on 7/29   Authorization Type BCBS PPO    Authorization Time Period no auth no viist limit    OT Start Time 1730    OT Stop Time 1822    OT Time Calculation (min) 52 min    Activity Tolerance Patient tolerated treatment well    Behavior During Therapy [facility] for tasks assessed/performed           Past Medical History:  Diagnosis Date  . Diabetes mellitus without complication [facility])     Past Surgical History:  Procedure Laterality Date  . IR CT HEAD LTD  03/24/2020  . IR CT HEAD LTD  03/24/2020  . IR INTRA CRAN STENT  03/24/2020  . IR PATIENT EVAL TECH 0-60 MINS  03/25/2020  . IR PERCUTANEOUS ART THROMBECTOMY/INFUSION INTRACRANIAL INC DIAG ANGIO  03/24/2020  . RADIOLOGY WITH ANESTHESIA N/A 03/24/2020   Procedure: IR WITH ANESTHESIA;  Surgeon: Brad Cotton, MD;  Location: [facility] OR;  Service: Radiology;  Laterality: N/A;    There were no vitals filed for this visit.   Subjective Assessment - 06/18/20 1858    Subjective  S: Patient and wife reporting that K tape was supportive the past two days. Also reporting that have been supporting patient's elbow at home with pillows    Pertinent History [name] was at work on 03/24/20 and had an accident, resulting in a CVA with RUE and RLE hemiparesis and expressive aphasia.  He was admitted to CIR and recieved extensive PT, OT, ST and was dc home on 05/13/20.  He has been referred to OT for evaluation and treatment.    Currently in Pain? Yes    Pain Score 4    face   Pain Location  Shoulder    Pain Orientation Right    Pain Descriptors / Indicators Aching;Sore;Spasm;Sharp                        OT Treatments/Exercises (OP) - 06/18/20 1859      Bed Mobility   Bed Mobility Supine to Sit;Sit to Supine    Supine to Sit Minimal Assistance - Patient > 75%   Demonsatrating increased control and following commands   Sit to Supine Minimal Assistance - Patient > 75%      Transfers   Transfers Sit to Stand;Stand to Sit    Sit to Stand 4: Min guard    Stand to Sit 4: Min guard    Comments Demonstrating increased control      Neurological Re-education Exercises   Scapular Stabilization 20 reps;Seated    Other Information While in sitting, use of anterior/posterior tilts of pelvis to manipulate trunk and facilitate scapular elevation and depression. Requiring Max A for facilitating movement of scapula    Shoulder Flexion PROM;10 reps;Supine    Shoulder ABduction PROM;10 reps;Supine    Elbow Flexion PROM;20 reps    Elbow Extension PROM;20 reps    Weight Bearing Position Seated    Seated with weight on forearm Support  under elbow (unable to place wrist down on mat as pt with increased pain) and pt reaching forward with LUE to grasp wash clothe andthen crossing midline to hand wash clothes to his grandson.       Manual Therapy   Manual Therapy Myofascial release;Scapular mobilization;Taping    Manual therapy comments Manual therapy completed prior to exercises.     Myofascial Release Myofascial release and manual stretching completed to right UE (shoulder and upper arm) to decrease fascial restrictions and increase joint mobility in a pain free zone.      Scapular Mobilization abduction/abbuction and elevation/depression of scapula while in supine.    Kinesiotex Facilitate Muscle   Decreased pain and subluxation                   OT Short Term Goals - 06/05/20 1651      OT SHORT TERM GOAL #1   Title Patient will be educated on a HEP for ADL  completion and improved RUE function.    Time 6    Period Weeks    Status On-going    Target Date 07/02/20      OT SHORT TERM GOAL #2   Title Paitent will complete dressing, bathing, grooming, and toileting with min pa.    Time 6    Period Weeks    Status On-going      OT SHORT TERM GOAL #3   Title Patient will propel wheelchair independently and safely household distances.    Time 6    Period Weeks    Status On-going      OT SHORT TERM GOAL #4   Title Patients vision will be assessed for deficits.    Time 6    Period Weeks    Status On-going      OT SHORT TERM GOAL #5   Title Patient will use RUE as a gross assist during ADL completion and transfers.    Time 6    Period Weeks    Status On-going      OT SHORT TERM GOAL #6   Title Patient will weightbear on his RUE with mod  facilitation to maintain positioning while shifting weight on and off of RUE during functional tasks.    Time 6    Period Weeks    Status On-going             OT Long Term Goals - 06/05/20 1651      OT LONG TERM GOAL #1   Title Patient will complete B/IADLS at highest level of independence possible.    Time 12    Period Weeks    Status On-going      OT LONG TERM GOAL #2   Title Patent will complete dressing, bathing, grooming, and toileting independently.    Time 12    Period Weeks    Status On-going      OT LONG TERM GOAL #3   Title Paitent will be educated and independent with compensatory techniques for visual deficits.    Time 12    Period Weeks    Status On-going      OT LONG TERM GOAL #4   Title Patient will use his RUE as an active assist during ADL completion.    Time 12    Period Weeks    Status On-going      OT LONG TERM GOAL #5   Title Paitent will weightbear on his RUE and weightshift during ADLs independently.    Time  12    Period Weeks    Status On-going      OT LONG TERM GOAL #6   Title Patient will improve tone in RUE from [place]unnstrom I to III in order to use  RUE actively during adl completion.    Time 12    Period Weeks    Status On-going                 Plan - 06/18/20 1907    Clinical Impression Statement A: Pt demonstrating decreased pain and increased activity tolerance this session. Started with myofascial release at shoulder and upper arm for decreasing pain and increasing ROM. Pt tolerating PROM in supine (maintaining K-tape in place from Tuesday session). Faciliating scapular movement in supine and then while seated with Max A. Pt tolarating functioanl reach task to facilitate trunk rotation, weight shifting, and weight bearing through RUE (with Max support at elbow). Reapplying K-tapping at shoulder for increased support and decreased pain. Weight bearing with support at elbow while pt  reached forward to give daughter high five. Wife, Daughter [name]) and grandson present.    Body Structure / Function / Physical Skills ADL;Decreased knowledge of use of DME;Strength;Tone;Pain;GMC;Dexterity;Balance;UE functional use;ROM;IADL;Vision;Coordination;FMC    Plan P: Continue myofasical release, scapular ROM, and weight bearing. Review K-tapping with patient and family. Order givmohr sling.    OT Home Exercise Plan eval: weighbearing, functional adl completion    Consulted and Agree with Plan of Care Patient;Family member/caregiver    Family Member Consulted wife [name]) and daughter [name])           Patient will benefit from skilled therapeutic intervention in order to improve the following deficits and impairments:   Body Structure / Function / Physical Skills: ADL, Decreased knowledge of use of DME, Strength, Tone, Pain, GMC, Dexterity, Balance, UE functional use, ROM, IADL, Vision, Coordination, [facility]       Visit Diagnosis: Hemiplegia and hemiparesis following cerebral infarction affecting right dominant side (HCC)  Acute pain of right shoulder    Problem List Patient Active Problem List   Diagnosis Date Noted  . Dysphagia,  post-stroke   . Essential hypertension   . Benign essential HTN   . SAH (subarachnoid hemorrhage) (HCC) 04/16/2020  . Cerebral edema (HCC) 04/16/2020  . Pneumonia 04/16/2020  . Hypertensive emergency 04/16/2020  . Hyperlipidemia 04/16/2020  . Diabetes mellitus type II, uncontrolled (HCC) 04/16/2020  . Dysphagia 04/16/2020  . Protein-calorie malnutrition (HCC) 04/16/2020  . Urinary retention 04/16/2020  . 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 Acute respiratory failure (HCC)   . Acute ischemic stroke (HCC) L MCA d/t L M1 oclusion s/p MCA stent 03/24/2020  . CVA (cerebral vascular accident) (HCC) 03/24/2020  . Middle cerebral artery embolism, left 03/24/2020    Gabriel Rung, MSOT, OTR/L  06/18/2020, 7:13 PM  [facility] [address] [place], [place], [address] Phone: [phone]   Fax:  [phone]  Name: Brad Delacruz MRN: [id number] Date of Birth: 02-04-1961

## 2020-06-19 ENCOUNTER — Ambulatory Visit (HOSPITAL_COMMUNITY): Payer: BC Managed Care – PPO | Admitting: Physical Therapy

## 2020-06-19 ENCOUNTER — Other Ambulatory Visit: Payer: Self-pay

## 2020-06-19 DIAGNOSIS — M6281 Muscle weakness (generalized): Secondary | ICD-10-CM

## 2020-06-19 DIAGNOSIS — M25511 Pain in right shoulder: Secondary | ICD-10-CM | POA: Diagnosis not present

## 2020-06-19 DIAGNOSIS — R2689 Other abnormalities of gait and mobility: Secondary | ICD-10-CM

## 2020-06-19 DIAGNOSIS — I69351 Hemiplegia and hemiparesis following cerebral infarction affecting right dominant side: Secondary | ICD-10-CM | POA: Diagnosis not present

## 2020-06-19 NOTE — Therapy (Signed)
[facility] [address] [place], [place], [address] Phone: [phone]   Fax:  [phone]  Physical Therapy Treatment  Patient Details  Name: Brad Delacruz MRN: [id number] Date of Birth: 1961-03-08 Referring Provider (PT): Mariam Dollar    Encounter Date: 06/19/2020   PT End of Session - 06/19/20 1528    Visit Number 9    Number of Visits 24    Date for PT Re-Evaluation 07/17/20    Authorization Type BCBS no auth, no VL    Progress Note Due on Visit 10    PT Start Time 1440    PT Stop Time 1525    PT Time Calculation (min) 45 min    Equipment Utilized During Treatment Gait belt    Activity Tolerance Patient tolerated treatment well;Patient limited by fatigue    Behavior During Therapy [facility] for tasks assessed/performed           Past Medical History:  Diagnosis Date  . Diabetes mellitus without complication [facility])     Past Surgical History:  Procedure Laterality Date  . IR CT HEAD LTD  03/24/2020  . IR CT HEAD LTD  03/24/2020  . IR INTRA CRAN STENT  03/24/2020  . IR PATIENT EVAL TECH 0-60 MINS  03/25/2020  . IR PERCUTANEOUS ART THROMBECTOMY/INFUSION INTRACRANIAL INC DIAG ANGIO  03/24/2020  . RADIOLOGY WITH ANESTHESIA N/A 03/24/2020   Procedure: IR WITH ANESTHESIA;  Surgeon: Julieanne Cotton, MD;  Location: [facility] OR;  Service: Radiology;  Laterality: N/A;    There were no vitals filed for this visit.   Subjective Assessment - 06/19/20 1443    Subjective Pt has no complaints of pain    Patient is accompained by: Family member;Interpreter    Pertinent History LT MCA infarct 03/24/20, DM    Patient Stated Goals Patient doesnt speak                             [facility] Adult PT Treatment/Exercise - 06/19/20 0001      Bed Mobility   Bed Mobility Supine to Sit;Sit to Supine    Rolling Right Minimal Assistance - Patient > 75%   x5   Supine to Sit Minimal Assistance - Patient > 75%   Demonsatrating increased  control and following commands x5   Sit to Supine Minimal Assistance - Patient > 75%      Transfers   Sit to Stand 4: Min guard    Sit to Stand Details Visual cues/gestures for precautions/safety;Verbal cues for sequencing;Verbal cues for precautions/safety;Verbal cues for technique    Stand to Sit 4: Min guard      Elbow Exercises   Elbow Flexion --    Elbow Extension --      Knee/Hip Exercises: Standing   Functional Squat 2 sets;5 reps      Knee/Hip Exercises: Seated   Long Arc Quad Left;1 set;15 reps    Long Arc Quad Limitations PROM with Lt LE lifting Rt    Marching Left;1 set;15 reps   total assist    Sit to Sand 2 sets;5 reps;with UE support   cueing for hand placement keep standing and wt shift.      Shoulder Exercises: Seated   Other Seated Exercises reaching out of BOS touching LT hand to therapist hand x 10        Manual Therapy   Manual Therapy --    Manual therapy comments --  Myofascial Release --    Scapular Mobilization --    Kinesiotex --                    PT Short Term Goals - 05/21/20 1829      PT SHORT TERM GOAL #1   Title Patient will be independent with initial HEP and self-management strategies to improve functional outcomes    Time 4    Period Weeks    Status New    Target Date 06/19/20      PT SHORT TERM GOAL #2   Title Patient will be able to perform stand Mod I to demonstrate improvement in functional mobility and reduced risk for falls.    Time 4    Period Weeks    Status New    Target Date 06/19/20      PT SHORT TERM GOAL #3   Title Patient will be able to maintain standing balance up to 45 sec with use of hemi walker to demo improved functional mobility.    Time 4    Period Weeks    Status New    Target Date 06/19/20             PT Long Term Goals - 05/21/20 1831      PT LONG TERM GOAL #1   Title Patient will be able to perform stand x 5 in < 60 seconds to demonstrate improvement in functional mobility and  reduced risk for falls.    Time 8    Period Weeks    Status New    Target Date 07/17/20      PT LONG TERM GOAL #2   Title Patient will be able to ambulate at least 75 feet during with LRAD to demonstrate improved ability to perform functional mobility and associated tasks.    Time 8    Period Weeks    Status New    Target Date 07/17/20      PT LONG TERM GOAL #3   Title Patient will be able to maintain standing balance up to 2 min with use of hemi walker to demo improved functional mobility.    Time 8    Period Weeks    Status New    Target Date 07/17/20                 Plan - 06/19/20 1529    Clinical Impression Statement Pt RT LE knee and ankle continues to by flacid, however noted improvement in standing and rolling.  Therapist attempted tapping as well as PNF with noted mm contraction for hip extension and adduction.    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, DM, aphasia    Examination-Activity Limitations Bathing;Lift;Bed Mobility;Locomotion Level;Bend;Caring for Others;Self Feeding;Transfers;Reach Overhead;Carry;Sit;Dressing;Hygiene/Grooming;Stairs;Squat;Stand;Toileting    Examination-Participation Restrictions Yard Work;Driving;Community Activity;Cleaning    Rehab Potential Fair    PT Frequency 3x / week    PT Duration 8 weeks    PT Treatment/Interventions ADLs/Self Care Home Management;Aquatic Therapy;Biofeedback;Cryotherapy;Electrical Stimulation;Contrast Bath;Therapeutic exercise;Therapeutic activities;Fluidtherapy;Parrafin;Patient/family education;Orthotic Fit/Training;Manual lymph drainage;Compression bandaging;Splinting;Taping;Vasopneumatic Device;Joint Manipulations;Spinal Manipulations;Energy conservation;Dry needling;Passive range of motion;Scar mobilization;Prosthetic Training;Wheelchair mobility training;Balance training;Neuromuscular re-education;DME Instruction;Gait training;Iontophoresis 4mg /ml  Dexamethasone;Moist Heat;Stair training;Traction;Functional mobility training;Ultrasound;Cognitive remediation;Manual techniques    PT Next Visit Plan  10th visit progress note due.  Progress strength with focus on functional mobility and transfers.    PT Home Exercise Plan 6/23:  bridge, SAQ, sit to stands  Patient will benefit from skilled therapeutic intervention in order to improve the following deficits and impairments:  Decreased balance, Difficulty walking, Impaired UE functional use, Decreased strength, Decreased range of motion, Decreased activity tolerance, Decreased mobility, Impaired sensation, Improper body mechanics, Decreased cognition, Abnormal gait, Decreased coordination  Visit Diagnosis: Muscle weakness (generalized)  Other abnormalities of gait and mobility     Problem List Patient Active Problem List   Diagnosis Date Noted  . Dysphagia, post-stroke   . Essential hypertension   . Benign essential HTN   . SAH (subarachnoid hemorrhage) (HCC) 04/16/2020  . Cerebral edema (HCC) 04/16/2020  . Pneumonia 04/16/2020  . Hypertensive emergency 04/16/2020  . Hyperlipidemia 04/16/2020  . Diabetes mellitus type II, uncontrolled (HCC) 04/16/2020  . Dysphagia 04/16/2020  . Protein-calorie malnutrition (HCC) 04/16/2020  . Urinary retention 04/16/2020  . 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 Acute respiratory failure (HCC)   . Acute ischemic stroke (HCC) L MCA d/t L M1 oclusion s/p MCA stent 03/24/2020  . CVA (cerebral vascular accident) (HCC) 03/24/2020  . Middle cerebral artery embolism, left 03/24/2020    Virgina Organ, PT CLT [phone] 06/19/2020, 3:33 PM  [facility] [address] [place], [place], [address] Phone: [phone]   Fax:  [phone]  Name: Brad Delacruz MRN: [id number] Date of Birth: 26-May-1961

## 2020-06-22 ENCOUNTER — Encounter (HOSPITAL_COMMUNITY): Payer: Self-pay | Admitting: Physical Therapy

## 2020-06-22 ENCOUNTER — Ambulatory Visit (HOSPITAL_COMMUNITY): Payer: BC Managed Care – PPO | Admitting: Physical Therapy

## 2020-06-22 ENCOUNTER — Other Ambulatory Visit: Payer: Self-pay

## 2020-06-22 DIAGNOSIS — R2689 Other abnormalities of gait and mobility: Secondary | ICD-10-CM | POA: Diagnosis not present

## 2020-06-22 DIAGNOSIS — M25511 Pain in right shoulder: Secondary | ICD-10-CM | POA: Diagnosis not present

## 2020-06-22 DIAGNOSIS — M6281 Muscle weakness (generalized): Secondary | ICD-10-CM | POA: Diagnosis not present

## 2020-06-22 DIAGNOSIS — I69351 Hemiplegia and hemiparesis following cerebral infarction affecting right dominant side: Secondary | ICD-10-CM | POA: Diagnosis not present

## 2020-06-22 NOTE — Therapy (Signed)
[facility] [address] [place], [place], [address] Phone: [phone]   Fax:  [phone]  Physical Therapy Treatment/ Progress Note  Patient Details  Name: Brad Delacruz MRN: [id number] Date of Birth: January 12, 1961 Referring Provider (PT): Lauraine Rinne    Encounter Date: 06/22/2020   Progress Note Reporting Period 05/20/20 to 06/22/20  See note below for Objective Data and Assessment of Progress/Goals.        PT End of Session - 06/22/20 1302    Visit Number 10    Number of Visits 24    Date for PT Re-Evaluation 07/17/20    Authorization Type BCBS no auth, no VL    Progress Note Due on Visit 20    PT Start Time 1258    PT Stop Time 1344    PT Time Calculation (min) 46 min    Equipment Utilized During Treatment Gait belt    Activity Tolerance Patient tolerated treatment well;Patient limited by fatigue    Behavior During Therapy WFL for tasks assessed/performed           Past Medical History:  Diagnosis Date  . Diabetes mellitus without complication [facility])     Past Surgical History:  Procedure Laterality Date  . IR CT HEAD LTD  03/24/2020  . IR CT HEAD LTD  03/24/2020  . IR INTRA CRAN STENT  03/24/2020  . IR PATIENT EVAL TECH 0-60 MINS  03/25/2020  . IR PERCUTANEOUS ART THROMBECTOMY/INFUSION INTRACRANIAL INC DIAG ANGIO  03/24/2020  . RADIOLOGY WITH ANESTHESIA N/A 03/24/2020   Procedure: IR WITH ANESTHESIA;  Surgeon: Luanne Bras, MD;  Location: [facility];  Service: Radiology;  Laterality: N/A;    There were no vitals filed for this visit.   Subjective Assessment - 06/22/20 1301    Subjective Reports no new issues. Pateitn wife says things are going good.    Patient is accompained by: Family member;Interpreter    Pertinent History LT MCA infarct 03/24/20, DM    Limitations Sitting;Lifting;Standing;Walking;House hold activities    Patient Stated Goals Patient doesnt speak    Currently in Pain? No/denies                [facility] PT Assessment - 06/22/20 0001      Assessment   Medical Diagnosis LT MCA     Referring Provider (PT) Lauraine Rinne     Onset Date/Surgical Date 03/24/20    Prior Therapy Inpatient rehab until 05/13/20      Precautions   Precautions Fall      Restrictions   Weight Bearing Restrictions No      Home Environment   Living Environment Private residence    Living Arrangements Spouse/significant other;Children    Available Help at Discharge Family      Prior Function   Level of Independence Independent      Cognition   Overall Cognitive Status Impaired/Different from baseline      Functional Tests   Functional tests --   STS x 5: 63 sec with use of LUE      Bed Mobility   Rolling Right --    Supine to Sit --      Transfers   Sit to Stand 4: Min guard    Stand to Sit 4: Min guard      Ambulation/Gait   Ambulation/Gait Yes    Ambulation/Gait Assistance 2: Max assist    Ambulation Distance (Feet) 12 Feet    Assistive device Parallel bars    Gait  Pattern Step-to pattern;Decreased arm swing - right;Decreased arm swing - left;Decreased step length - right;Decreased step length - left;Decreased stride length;Decreased hip/knee flexion - right;Decreased dorsiflexion - right;Decreased weight shift to right;Poor foot clearance - right    Ambulation Surface Level;Indoor    Gait Comments requires Max A for RLE advance       Static Standing Balance   Static Standing - Balance Support Left upper extremity supported    Static Standing - Level of Assistance 5: Stand by assistance;6: Modified independent (Device/Increase time)    Static Standing - Comment/# of Minutes able to maintain 2 min standing with LUE assist                          [facility] Adult PT Treatment/Exercise - 06/22/20 0001      Knee/Hip Exercises: Seated   Long Arc Quad Left;2 sets;10 reps    Other Seated Knee/Hip Exercises seated heel/toe raise 2 x 10 each     Marching Left;2 sets;10  reps    Abduction/Adduction  Both;1 set;10 reps    Abd/Adduction Limitations abduction 5" holds with belt at knees    Sit to Sand 2 sets;5 reps;with UE support                  PT Education - 06/22/20 1302    Education Details on reassessment findings and POC    Person(s) Educated Patient;Spouse    Methods Explanation    Comprehension Verbalized understanding            PT Short Term Goals - 06/22/20 1340      PT SHORT TERM GOAL #1   Title Patient will be independent with initial HEP and self-management strategies to improve functional outcomes    Baseline Reports compliance    Time 4    Period Weeks    Status Achieved    Target Date 06/19/20      PT SHORT TERM GOAL #2   Title Patient will be able to perform stand Mod I to demonstrate improvement in functional mobility and reduced risk for falls.    Baseline Current: Min gaurd    Time 4    Period Weeks    Status On-going    Target Date 06/19/20      PT SHORT TERM GOAL #3   Title Patient will be able to maintain standing balance up to 45 sec with use of hemi walker to demo improved functional mobility.    Baseline can stand 2 minutes with LUE assist using parallel bars    Time 4    Period Weeks    Status Partially Met    Target Date 06/19/20             PT Long Term Goals - 06/22/20 1341      PT LONG TERM GOAL #1   Title Patient will be able to perform stand x 5 in < 60 seconds to demonstrate improvement in functional mobility and reduced risk for falls.    Baseline current 63 seconds with heavy use of LUE    Time 8    Period Weeks    Status On-going      PT LONG TERM GOAL #2   Title Patient will be able to ambulate at least 75 feet during 2MWT with LRAD to demonstrate improved ability to perform functional mobility and associated tasks.    Baseline Current 12 feet in parallell bars with Max A for RLE movement  Time 8    Period Weeks    Status On-going      PT LONG TERM GOAL #3   Title Patient  will be able to maintain standing balance up to 2 min with use of hemi walker to demo improved functional mobility.    Baseline can stand 2 minutes with LUE assist using parallel bars    Time 8    Period Weeks    Status Partially Met                 Plan - 06/22/20 1355    Clinical Impression Statement Patient is making good progress toward therapy goals. Patient currently with 2/3 short term and 1/3 long term goals met/ partially met. Patient is improving in functional strength and shows good improvement with bed mobility and chair transfers. Patient continues to require heavy LUE assist and min guard when fatigued. Patient able to stand Mod I using LUE assist with parallel bars for 2 minutes. Patient improving with gait training, but continues to require frequent verbal cues and Max A for RLE advance. Patient will continue to benefit from skilled therapy services to progress functional strength, gait and balance to improve functional mobility and transfers for increased QOL.    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, DM, aphasia    Examination-Activity Limitations Bathing;Lift;Bed Mobility;Locomotion Level;Bend;Caring for Others;Self Feeding;Transfers;Reach Overhead;Carry;Sit;Dressing;Hygiene/Grooming;Stairs;Squat;Stand;Toileting    Examination-Participation Restrictions Yard Work;Driving;Community Activity;Cleaning    Rehab Potential Fair    PT Frequency 3x / week    PT Duration 8 weeks    PT Treatment/Interventions ADLs/Self Care Home Management;Aquatic Therapy;Biofeedback;Cryotherapy;Electrical Stimulation;Contrast Bath;Therapeutic exercise;Therapeutic activities;Fluidtherapy;Parrafin;Patient/family education;Orthotic Fit/Training;Manual lymph drainage;Compression bandaging;Splinting;Taping;Vasopneumatic Device;Joint Manipulations;Spinal Manipulations;Energy conservation;Dry needling;Passive range of motion;Scar mobilization;Prosthetic  Training;Wheelchair mobility training;Balance training;Neuromuscular re-education;DME Instruction;Gait training;Iontophoresis 38m/ml Dexamethasone;Moist Heat;Stair training;Traction;Functional mobility training;Ultrasound;Cognitive remediation;Manual techniques    PT Next Visit Plan Progress strength with focus on functional mobility and transfers.    PT Home Exercise Plan 6/23:  bridge, SAQ, sit to stands    Consulted and Agree with Plan of Care Patient;Family member/caregiver;Other (Comment)    Family Member Consulted Wife           Patient will benefit from skilled therapeutic intervention in order to improve the following deficits and impairments:  Decreased balance, Difficulty walking, Impaired UE functional use, Decreased strength, Decreased range of motion, Decreased activity tolerance, Decreased mobility, Impaired sensation, Improper body mechanics, Decreased cognition, Abnormal gait, Decreased coordination  Visit Diagnosis: Muscle weakness (generalized)  Other abnormalities of gait and mobility     Problem List Patient Active Problem List   Diagnosis Date Noted  . Dysphagia, post-stroke   . Essential hypertension   . Benign essential HTN   . SAH (subarachnoid hemorrhage) ([facility] 04/16/2020  . Cerebral edema ([facility] 04/16/2020  . Pneumonia 04/16/2020  . Hypertensive emergency 04/16/2020  . Hyperlipidemia 04/16/2020  . Diabetes mellitus type II, uncontrolled ([place] 04/16/2020  . Dysphagia 04/16/2020  . Protein-calorie malnutrition ([place] 04/16/2020  . Urinary retention 04/16/2020  . Left middle cerebral artery stroke ([facility] 04/16/2020  . Acute respiratory failure ([facility]   . Acute ischemic stroke ([facility] [place] L MCA d/t L M1 oclusion s/p MCA stent 03/24/2020  . CVA (cerebral vascular accident) ([facility] [place] 03/24/2020  . Middle cerebral artery embolism, left 03/24/2020    1:59 PM, 06/22/20 CJosue HectorPT DPT  Physical [facility] ([phone]  [facility] [address] [place], [place], [address] Phone: [phone]   Fax:  [phone]  Name: Aziel Morgan MRN: [id number] Date of Birth: [birth year]

## 2020-06-23 ENCOUNTER — Ambulatory Visit (HOSPITAL_COMMUNITY): Payer: BC Managed Care – PPO | Admitting: Occupational Therapy

## 2020-06-23 ENCOUNTER — Encounter (HOSPITAL_COMMUNITY): Payer: Self-pay | Admitting: Occupational Therapy

## 2020-06-23 DIAGNOSIS — I69351 Hemiplegia and hemiparesis following cerebral infarction affecting right dominant side: Secondary | ICD-10-CM

## 2020-06-23 DIAGNOSIS — M25511 Pain in right shoulder: Secondary | ICD-10-CM

## 2020-06-23 DIAGNOSIS — R2689 Other abnormalities of gait and mobility: Secondary | ICD-10-CM | POA: Diagnosis not present

## 2020-06-23 DIAGNOSIS — M6281 Muscle weakness (generalized): Secondary | ICD-10-CM | POA: Diagnosis not present

## 2020-06-23 NOTE — Therapy (Signed)
[facility] [address] [place], [place], [address] Phone: [phone]   Fax:  [phone]  Occupational Therapy Treatment  Patient Details  Name: Brad Delacruz MRN: [id number] Date of Birth: 1961/08/01 Referring Provider (OT): Mariam Dollar, [place]    Encounter Date: 06/23/2020   OT End of Session - 06/23/20 1844    Visit Number 9    Number of Visits 24    Date for OT Re-Evaluation 08/21/20   mini reassess on 7/29   Authorization Type BCBS PPO    Authorization Time Period no auth no viist limit    OT Start Time 1643    OT Stop Time 1729    OT Time Calculation (min) 46 min    Activity Tolerance Patient tolerated treatment well    Behavior During Therapy [facility] for tasks assessed/performed           Past Medical History:  Diagnosis Date  . Diabetes mellitus without complication [facility])     Past Surgical History:  Procedure Laterality Date  . IR CT HEAD LTD  03/24/2020  . IR CT HEAD LTD  03/24/2020  . IR INTRA CRAN STENT  03/24/2020  . IR PATIENT EVAL TECH 0-60 MINS  03/25/2020  . IR PERCUTANEOUS ART THROMBECTOMY/INFUSION INTRACRANIAL INC DIAG ANGIO  03/24/2020  . RADIOLOGY WITH ANESTHESIA N/A 03/24/2020   Procedure: IR WITH ANESTHESIA;  Surgeon: Julieanne Cotton, MD;  Location[facility];  Service: Radiology;  Laterality: N/A;    There were no vitals filed for this visit.   Subjective Assessment - 06/23/20 1839    Subjective  S: Reports he feels more tired today. Wife also reporting that MD started pt on muscle relaxer today.    Pertinent History Brad Delacruz was at work on 03/24/20 and had an accident, resulting in a CVA with RUE and RLE hemiparesis and expressive aphasia.  He was admitted to CIR and recieved extensive PT, OT, ST and was dc home on 05/13/20.  He has been referred to OT for evaluation and treatment.    Currently in Pain? Yes    Pain Score 6    FACE   Pain Location Shoulder    Pain Orientation Right    Pain Descriptors  / Indicators Aching;Guarding;Grimacing                        OT Treatments/Exercises (OP) - 06/23/20 1841      Bed Mobility   Bed Mobility Supine to Sit;Sit to Supine    Supine to Sit Moderate Assistance - Patient 50-74%    Sit to Supine Minimal Assistance - Patient > 75%      Transfers   Transfers Sit to Stand;Stand to Sit    Sit to Stand 4: Min guard;4: Min assist    Sit to Stand Details (indicate cue type and reason) Completed from w/c with Min Guard A. From mat to w/c; requiring Min A    Stand to Sit 4: Min guard      ADLs   UB Dressing Performing UB dressing to don/doff shirt. Providing education on compensatory techniques for donning/doffing shirt; pt able to doff with [employer] A once given education. Requiring Min A for donning shirt. Reviewed compensatory tehcniques with both pt nad wife and encouraged to practice at home. (Over head first doffing. RUE, head, LUE for donning)      Neurological Re-education Exercises   Scapular Stabilization 10 reps    Other Information While  in sitting, use of anterior/posterior tilts of pelvis to manipulate trunk and facilitate scapular elevation and depression. Requiring Max A for facilitating movement of scapula    Shoulder Flexion PROM;10 reps;Supine    Shoulder ABduction PROM;10 reps;Supine      Manual Therapy   Manual Therapy Myofascial release;Taping    Manual therapy comments Manual therapy completed prior to exercises.     Myofascial Release Myofascial release and manual stretching completed to right UE (shoulder and upper arm) to decrease fascial restrictions and increase joint mobility in a pain free zone.      Kinesiotex Facilitate Muscle   Decrease subluxation                   OT Short Term Goals - 06/05/20 1651      OT SHORT TERM GOAL #1   Title Patient will be educated on a HEP for ADL completion and improved RUE function.    Time 6    Period Weeks    Status On-going    Target Date 07/02/20        OT SHORT TERM GOAL #2   Title Paitent will complete dressing, bathing, grooming, and toileting with min pa.    Time 6    Period Weeks    Status On-going      OT SHORT TERM GOAL #3   Title Patient will propel wheelchair independently and safely household distances.    Time 6    Period Weeks    Status On-going      OT SHORT TERM GOAL #4   Title Patients vision will be assessed for deficits.    Time 6    Period Weeks    Status On-going      OT SHORT TERM GOAL #5   Title Patient will use RUE as a gross assist during ADL completion and transfers.    Time 6    Period Weeks    Status On-going      OT SHORT TERM GOAL #6   Title Patient will weightbear on his RUE with mod  facilitation to maintain positioning while shifting weight on and off of RUE during functional tasks.    Time 6    Period Weeks    Status On-going             OT Long Term Goals - 06/05/20 1651      OT LONG TERM GOAL #1   Title Patient will complete B/IADLS at highest level of independence possible.    Time 12    Period Weeks    Status On-going      OT LONG TERM GOAL #2   Title Patent will complete dressing, bathing, grooming, and toileting independently.    Time 12    Period Weeks    Status On-going      OT LONG TERM GOAL #3   Title Paitent will be educated and independent with compensatory techniques for visual deficits.    Time 12    Period Weeks    Status On-going      OT LONG TERM GOAL #4   Title Patient will use his RUE as an active assist during ADL completion.    Time 12    Period Weeks    Status On-going      OT LONG TERM GOAL #5   Title Paitent will weightbear on his RUE and weightshift during ADLs independently.    Time 12    Period Weeks    Status On-going  OT LONG TERM GOAL #6   Title Patient will improve tone in RUE from [place] I to III in order to use RUE actively during adl completion.    Time 12    Period Weeks    Status On-going                  Plan - 06/23/20 1845    Clinical Impression Statement A: Upon arrival, pt seeming more fatigued as seen by flatter affect, closing his eyes frequently, and difficulty following commands. Pt reporting he is more fatigued today than normal. Also, wife porting pt started muscle relaxers today (will follow to see impact on pain and PROM tolerance). Starting with myofascial release; noting decreased fasical restrictions compared to prior session. Pt tolerating further PROM this session. Reapplying new K-tape; hopeful that K-tape is providing support for decreased pain. Faciliating scapular movement in supine and then while seated with Max A. Pt performing donning/doffing of shirt. Educating pt and wife on compensatory tehcniques and encouraging to practie at home; both verbalized understanding.    Body Structure / Function / Physical Skills ADL;Decreased knowledge of use of DME;Strength;Tone;Pain;GMC;Dexterity;Balance;UE functional use;ROM;IADL;Vision;Coordination;FMC    Plan P: Continue myofasical release, scapular ROM, PROM of RUE, and weight bearing. Review K-tapping with patient and family. Follow up on donning/doffing of shirt. See if givmohr sling arrived.    OT Home Exercise Plan eval: weighbearing, functional adl completion    Consulted and Agree with Plan of Care Patient;Family member/caregiver    Family Member Consulted wife [name]) and daughter [name])           Patient will benefit from skilled therapeutic intervention in order to improve the following deficits and impairments:   Body Structure / Function / Physical Skills: ADL, Decreased knowledge of use of DME, Strength, Tone, Pain, GMC, Dexterity, Balance, UE functional use, ROM, IADL, Vision, Coordination, [facility]       Visit Diagnosis: Hemiplegia and hemiparesis following cerebral infarction affecting right dominant side (HCC)  Acute pain of right shoulder    Problem List Patient Active Problem List   Diagnosis Date Noted  .  Dysphagia, post-stroke   . Essential hypertension   . Benign essential HTN   . SAH (subarachnoid hemorrhage) (HCC) 04/16/2020  . Cerebral edema (HCC) 04/16/2020  . Pneumonia 04/16/2020  . Hypertensive emergency 04/16/2020  . Hyperlipidemia 04/16/2020  . Diabetes mellitus type II, uncontrolled (HCC) 04/16/2020  . Dysphagia 04/16/2020  . Protein-calorie malnutrition (HCC) 04/16/2020  . Urinary retention 04/16/2020  . 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 Acute respiratory failure (HCC)   . Acute ischemic stroke (HCC) L MCA d/t L M1 oclusion s/p MCA stent 03/24/2020  . CVA (cerebral vascular accident) (HCC) 03/24/2020  . Middle cerebral artery embolism, left 03/24/2020    Gabriel Rung, MSOT, OTR/L 06/23/2020, 6:52 PM  [facility] [address] [place], [place], [address] Phone: [phone]   Fax:  [phone]  Name: Brad Delacruz MRN: [id number] Date of Birth: 1961-03-01

## 2020-06-24 ENCOUNTER — Ambulatory Visit (HOSPITAL_COMMUNITY): Payer: BC Managed Care – PPO | Admitting: Physical Therapy

## 2020-06-24 ENCOUNTER — Other Ambulatory Visit: Payer: Self-pay

## 2020-06-24 ENCOUNTER — Encounter (HOSPITAL_COMMUNITY): Payer: Self-pay | Admitting: Physical Therapy

## 2020-06-24 DIAGNOSIS — I69351 Hemiplegia and hemiparesis following cerebral infarction affecting right dominant side: Secondary | ICD-10-CM | POA: Diagnosis not present

## 2020-06-24 DIAGNOSIS — M6281 Muscle weakness (generalized): Secondary | ICD-10-CM

## 2020-06-24 DIAGNOSIS — R2689 Other abnormalities of gait and mobility: Secondary | ICD-10-CM | POA: Diagnosis not present

## 2020-06-24 DIAGNOSIS — M25511 Pain in right shoulder: Secondary | ICD-10-CM | POA: Diagnosis not present

## 2020-06-24 NOTE — Therapy (Signed)
[place], [place], [address] Phone: [phone]   Fax:  [phone]  Physical Therapy Treatment  Patient Details  Name: Brad Delacruz MRN: [id number] Date of Birth: 02-25-1961 Referring Provider (PT): Lauraine Rinne    Encounter Date: 06/24/2020   PT End of Session - 06/24/20 1428    Visit Number 11    Number of Visits 24    Date for PT Re-Evaluation 07/17/20    Authorization Type BCBS no auth, no VL    Progress Note Due on Visit 20    PT Start Time 1430    PT Stop Time 1515    PT Time Calculation (min) 45 min    Equipment Utilized During Treatment Gait belt    Activity Tolerance Patient tolerated treatment well;Patient limited by fatigue    Behavior During Therapy [facility] for tasks assessed/performed           Past Medical History:  Diagnosis Date  . Diabetes mellitus without complication [facility])     Past Surgical History:  Procedure Laterality Date  . IR CT HEAD LTD  03/24/2020  . IR CT HEAD LTD  03/24/2020  . IR INTRA CRAN STENT  03/24/2020  . IR PATIENT EVAL TECH 0-60 MINS  03/25/2020  . IR PERCUTANEOUS ART THROMBECTOMY/INFUSION INTRACRANIAL INC DIAG ANGIO  03/24/2020  . RADIOLOGY WITH ANESTHESIA N/A 03/24/2020   Procedure: IR WITH ANESTHESIA;  Surgeon: Luanne Bras, MD;  Location: [place];  Service: Radiology;  Laterality: N/A;    There were no vitals filed for this visit.   Subjective Assessment - 06/24/20 1433    Subjective No new reports    Patient is accompained by: Family member;Interpreter    Pertinent History LT MCA infarct 03/24/20, DM    Limitations Sitting;Lifting;Standing;Walking;House hold activities    Patient Stated Goals Patient doesnt speak    Currently in Pain? No/denies                             [facility] Adult PT Treatment/Exercise - 06/24/20 0001      Knee/Hip Exercises: Standing   Gait Training 15' using hemi walker, WC follow, Mod verbal cue for  seqencing, Max A for RLE movement       Knee/Hip Exercises: Seated   Long Arc Quad Left;2 sets;10 reps    Other Seated Knee/Hip Exercises unsupported core perturbations in all planes 3'     Other Seated Knee/Hip Exercises seated heel/toe raise 2 x 10 each     Marching Left;2 sets;10 reps    Abduction/Adduction  Both;1 set;10 reps    Abd/Adduction Limitations 5" hold each, ball and belt     Sit to Sand 1 set;5 reps;with UE support   cues for hand placement                    PT Short Term Goals - 06/22/20 1340      PT SHORT TERM GOAL #1   Title Patient will be independent with initial HEP and self-management strategies to improve functional outcomes    Baseline Reports compliance    Time 4    Period Weeks    Status Achieved    Target Date 06/19/20      PT SHORT TERM GOAL #2   Title Patient will be able to perform stand Mod I to demonstrate improvement in functional mobility and reduced risk for falls.    Baseline Current:  Min gaurd    Time 4    Period Weeks    Status On-going    Target Date 06/19/20      PT SHORT TERM GOAL #3   Title Patient will be able to maintain standing balance up to 45 sec with use of hemi walker to demo improved functional mobility.    Baseline can stand 2 minutes with LUE assist using parallel bars    Time 4    Period Weeks    Status Partially Met    Target Date 06/19/20             PT Long Term Goals - 06/22/20 1341      PT LONG TERM GOAL #1   Title Patient will be able to perform stand x 5 in < 60 seconds to demonstrate improvement in functional mobility and reduced risk for falls.    Baseline current 63 seconds with heavy use of LUE    Time 8    Period Weeks    Status On-going      PT LONG TERM GOAL #2   Title Patient will be able to ambulate at least 75 feet during 2MWT with LRAD to demonstrate improved ability to perform functional mobility and associated tasks.    Baseline Current 12 feet in parallell bars with Max A for  RLE movement    Time 8    Period Weeks    Status On-going      PT LONG TERM GOAL #3   Title Patient will be able to maintain standing balance up to 2 min with use of hemi walker to demo improved functional mobility.    Baseline can stand 2 minutes with LUE assist using parallel bars    Time 8    Period Weeks    Status Partially Met                 Plan - 06/24/20 1633    Clinical Impression Statement Patient tolerated added seated progressions for core and LE strengthening well. Patient and wife educated on purpose and function of added activity. Patient continues to require frequent verbal cues for pushing from bed and proper weight shift with sit to stands. Patient demos some improvement with sequencing using hemi walker today, but continues to require Max A for RLE placement per flaccid limb.    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, DM, aphasia    Examination-Activity Limitations Bathing;Lift;Bed Mobility;Locomotion Level;Bend;Caring for Others;Self Feeding;Transfers;Reach Overhead;Carry;Sit;Dressing;Hygiene/Grooming;Stairs;Squat;Stand;Toileting    Examination-Participation Restrictions Yard Work;Driving;Community Activity;Cleaning    Rehab Potential Fair    PT Frequency 3x / week    PT Duration 8 weeks    PT Treatment/Interventions ADLs/Self Care Home Management;Aquatic Therapy;Biofeedback;Cryotherapy;Electrical Stimulation;Contrast Bath;Therapeutic exercise;Therapeutic activities;Fluidtherapy;Parrafin;Patient/family education;Orthotic Fit/Training;Manual lymph drainage;Compression bandaging;Splinting;Taping;Vasopneumatic Device;Joint Manipulations;Spinal Manipulations;Energy conservation;Dry needling;Passive range of motion;Scar mobilization;Prosthetic Training;Wheelchair mobility training;Balance training;Neuromuscular re-education;DME Instruction;Gait training;Iontophoresis 40m/ml Dexamethasone;Moist Heat;Stair  training;Traction;Functional mobility training;Ultrasound;Cognitive remediation;Manual techniques    PT Next Visit Plan Progress strength with focus on functional mobility and transfers.    PT Home Exercise Plan 6/23:  bridge, SAQ, sit to stands    Consulted and Agree with Plan of Care Patient;Family member/caregiver;Other (Comment)    Family Member Consulted Wife           Patient will benefit from skilled therapeutic intervention in order to improve the following deficits and impairments:  Decreased balance, Difficulty walking, Impaired UE functional use, Decreased strength, Decreased range of motion, Decreased activity tolerance, Decreased  mobility, Impaired sensation, Improper body mechanics, Decreased cognition, Abnormal gait, Decreased coordination  Visit Diagnosis: Muscle weakness (generalized)  Other abnormalities of gait and mobility     Problem List Patient Active Problem List   Diagnosis Date Noted  . Dysphagia, post-stroke   . Essential hypertension   . Benign essential HTN   . SAH (subarachnoid hemorrhage) ([place]) 04/16/2020  . Cerebral edema ([place]) 04/16/2020  . Pneumonia 04/16/2020  . Hypertensive emergency 04/16/2020  . Hyperlipidemia 04/16/2020  . Diabetes mellitus type II, uncontrolled ([place]) 04/16/2020  . Dysphagia 04/16/2020  . Protein-calorie malnutrition ([place]) 04/16/2020  . Urinary retention 04/16/2020  . 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 Acute respiratory failure ([place])   . Acute ischemic stroke ([place]) L MCA d/t L M1 oclusion s/p MCA stent 03/24/2020  . CVA (cerebral vascular accident) ([place]) 03/24/2020  . Middle cerebral artery embolism, left 03/24/2020   4:41 PM, 06/24/20 Josue Hector PT DPT  Physical Therapist with [facility]  [phone] [address] [place], [place], [address] Phone: [phone]   Fax:  [phone]  Name: Salome Cozby MRN: [id number] Date of Birth: 1961-05-27

## 2020-06-25 ENCOUNTER — Encounter (HOSPITAL_COMMUNITY): Payer: Self-pay | Admitting: Occupational Therapy

## 2020-06-25 ENCOUNTER — Ambulatory Visit (HOSPITAL_COMMUNITY): Payer: BC Managed Care – PPO | Admitting: Occupational Therapy

## 2020-06-25 DIAGNOSIS — M6281 Muscle weakness (generalized): Secondary | ICD-10-CM | POA: Diagnosis not present

## 2020-06-25 DIAGNOSIS — I69351 Hemiplegia and hemiparesis following cerebral infarction affecting right dominant side: Secondary | ICD-10-CM | POA: Diagnosis not present

## 2020-06-25 DIAGNOSIS — R2689 Other abnormalities of gait and mobility: Secondary | ICD-10-CM | POA: Diagnosis not present

## 2020-06-25 DIAGNOSIS — M25511 Pain in right shoulder: Secondary | ICD-10-CM

## 2020-06-25 NOTE — Therapy (Signed)
[place] [facility] [address] [place], [place], [address] Phone: [phone]   Fax:  [phone]  Occupational Therapy Treatment  Patient Details  Name: Brad Delacruz MRN: [id number] Date of Birth: [birth year] Referring Provider (OT): Mariam Dollar, [place]    Encounter Date: 06/25/2020   OT End of Session - 06/25/20 1847    Visit Number 10    Number of Visits 24    Date for OT Re-Evaluation 08/21/20   mini reassess on 7/29   Authorization Type BCBS PPO    Authorization Time Period no auth no viist limit    OT Start Time 1728    OT Stop Time 1820    OT Time Calculation (min) 52 min    Activity Tolerance Patient tolerated treatment well    Behavior During Therapy [facility] for tasks assessed/performed           Past Medical History:  Diagnosis Date  . Diabetes mellitus without complication [facility])     Past Surgical History:  Procedure Laterality Date  . IR CT HEAD LTD  03/24/2020  . IR CT HEAD LTD  03/24/2020  . IR INTRA CRAN STENT  03/24/2020  . IR PATIENT EVAL TECH 0-60 MINS  03/25/2020  . IR PERCUTANEOUS ART THROMBECTOMY/INFUSION INTRACRANIAL INC DIAG ANGIO  03/24/2020  . RADIOLOGY WITH ANESTHESIA N/A 03/24/2020   Procedure: IR WITH ANESTHESIA;  Surgeon: Julieanne Cotton, MD;  [facility];  Service: Radiology;  Laterality: N/A;    There were no vitals filed for this visit.   Subjective Assessment - 06/25/20 1835    Subjective  S: Pt reports he is tired too.    Pertinent History Mr. Coutant was at work on 03/24/20 and had an accident, resulting in a CVA with RUE and RLE hemiparesis and expressive aphasia.  He was admitted to CIR and recieved extensive PT, OT, ST and was dc home on 05/13/20.  He has been referred to OT for evaluation and treatment.    Currently in Pain? Yes    Pain Score 4    Face   Pain Location Leg    Pain Orientation Right    Pain Type Acute pain                        OT Treatments/Exercises (OP)  - 06/25/20 1839      Bed Mobility   Bed Mobility Supine to Sit;Sit to Supine    Supine to Sit Moderate Assistance - Patient 50-74%    Sit to Supine Minimal Assistance - Patient > 75%      ADLs   UB Dressing Pt doffing shirt with Max visual and verbal cues; able to perform with supervision. Pt requiring Min A for donning shirt. Min A to support RUE and Max cues sequencing.       Neurological Re-education Exercises   Scapular Stabilization 10 reps;Seated    Other Information While in sitting, use of anterior/posterior tilts of pelvis to manipulate trunk and facilitate scapular elevation and depression. Requiring Max A for facilitating movement of scapula    Shoulder Flexion PROM;10 reps;Supine    Shoulder ABduction PROM;10 reps;Supine    Shoulder External Rotation PROM;10 reps    Shoulder Internal Rotation PROM;10 reps    Weight Bearing Position Seated    Seated with weight on forearm Providing support through elbow and decrease subluxation. Pt reaching with LUE to left side for wash clothe and then crossing midline to place on stool  on right side..      Manual Therapy   Manual Therapy Myofascial release;Taping    Manual therapy comments Manual therapy completed prior to exercises.     Myofascial Release Myofascial release and manual stretching completed to right UE (shoulder and upper arm) to decrease fascial restrictions and increase joint mobility in a pain free zone.      Scapular Mobilization abduction/abbuction and elevation/depression of scapula while in supine.    Kinesiotex Facilitate Muscle   Decrease subluxation                   OT Short Term Goals - 06/05/20 1651      OT SHORT TERM GOAL #1   Title Patient will be educated on a HEP for ADL completion and improved RUE function.    Time 6    Period Weeks    Status On-going    Target Date 07/02/20      OT SHORT TERM GOAL #2   Title Paitent will complete dressing, bathing, grooming, and toileting with min pa.      Time 6    Period Weeks    Status On-going      OT SHORT TERM GOAL #3   Title Patient will propel wheelchair independently and safely household distances.    Time 6    Period Weeks    Status On-going      OT SHORT TERM GOAL #4   Title Patients vision will be assessed for deficits.    Time 6    Period Weeks    Status On-going      OT SHORT TERM GOAL #5   Title Patient will use RUE as a gross assist during ADL completion and transfers.    Time 6    Period Weeks    Status On-going      OT SHORT TERM GOAL #6   Title Patient will weightbear on his RUE with mod  facilitation to maintain positioning while shifting weight on and off of RUE during functional tasks.    Time 6    Period Weeks    Status On-going             OT Long Term Goals - 06/05/20 1651      OT LONG TERM GOAL #1   Title Patient will complete B/IADLS at highest level of independence possible.    Time 12    Period Weeks    Status On-going      OT LONG TERM GOAL #2   Title Patent will complete dressing, bathing, grooming, and toileting independently.    Time 12    Period Weeks    Status On-going      OT LONG TERM GOAL #3   Title Paitent will be educated and independent with compensatory techniques for visual deficits.    Time 12    Period Weeks    Status On-going      OT LONG TERM GOAL #4   Title Patient will use his RUE as an active assist during ADL completion.    Time 12    Period Weeks    Status On-going      OT LONG TERM GOAL #5   Title Paitent will weightbear on his RUE and weightshift during ADLs independently.    Time 12    Period Weeks    Status On-going      OT LONG TERM GOAL #6   Title Patient will improve tone in RUE from [place] I to  III in order to use RUE actively during adl completion.    Time 12    Period Weeks    Status On-going                 Plan - 06/25/20 1848    Clinical Impression Statement A: Discussing donning/doffing shirt process at home; Wife  reports she has been helping him with bringing shirt over head and on RUE. Starting with myofascial release and PROM in supine to reduce pain and increase ROM. Noting that patient with decrease pain during manual therapy and increased PROM. Continuing scapular mobilization with trunk flexion/extension and weight bearing during funcitonal reach. Practing donning/doffing of shirt with Min Guard-Min A and Mod-Max cues; 2 reps. Wife assisting with application of K-tape. Will conitnue to education on technique.    Body Structure / Function / Physical Skills ADL;Decreased knowledge of use of DME;Strength;Tone;Pain;GMC;Dexterity;Balance;UE functional use;ROM;IADL;Vision;Coordination;FMC    Plan P: Continue myofasical release, scapular ROM, PROM of RUE, and weight bearing. Continue K-tape education with patient and family. Follow up on donning/doffing of shirt. See if givmohr sling arrived.    OT Home Exercise Plan eval: weighbearing, functional adl completion    Consulted and Agree with Plan of Care Patient;Family member/caregiver    Family Member Consulted wife [name]) and daughter [name])           Patient will benefit from skilled therapeutic intervention in order to improve the following deficits and impairments:   Body Structure / Function / Physical Skills: ADL, Decreased knowledge of use of DME, Strength, Tone, Pain, GMC, Dexterity, Balance, UE functional use, ROM, IADL, Vision, Coordination, [facility]       Visit Diagnosis: Hemiplegia and hemiparesis following cerebral infarction affecting right dominant side (HCC)  Acute pain of right shoulder    Problem List Patient Active Problem List   Diagnosis Date Noted  . Dysphagia, post-stroke   . Essential hypertension   . Benign essential HTN   . SAH (subarachnoid hemorrhage) (HCC) 04/16/2020  . Cerebral edema (HCC) 04/16/2020  . Pneumonia 04/16/2020  . Hypertensive emergency 04/16/2020  . Hyperlipidemia 04/16/2020  . Diabetes mellitus type  II, uncontrolled (HCC) 04/16/2020  . Dysphagia 04/16/2020  . Protein-calorie malnutrition (HCC) 04/16/2020  . Urinary retention 04/16/2020  . 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 Acute respiratory failure (HCC)   . Acute ischemic stroke (HCC) L MCA d/t L M1 oclusion s/p MCA stent 03/24/2020  . CVA (cerebral vascular accident) (HCC) 03/24/2020  . Middle cerebral artery embolism, left 03/24/2020    Gabriel Rung, MSOT, OTR/L 06/25/2020, 6:59 PM  [place] [facility] [address] [place], [place], [address] Phone: [phone]   Fax:  [phone]  Name: Brad Delacruz MRN: [id number] Date of Birth: 19-Jun-1961

## 2020-06-26 ENCOUNTER — Ambulatory Visit (HOSPITAL_COMMUNITY): Payer: BC Managed Care – PPO | Admitting: Physical Therapy

## 2020-06-26 ENCOUNTER — Other Ambulatory Visit: Payer: Self-pay

## 2020-06-26 DIAGNOSIS — M6281 Muscle weakness (generalized): Secondary | ICD-10-CM

## 2020-06-26 DIAGNOSIS — M25511 Pain in right shoulder: Secondary | ICD-10-CM | POA: Diagnosis not present

## 2020-06-26 DIAGNOSIS — R2689 Other abnormalities of gait and mobility: Secondary | ICD-10-CM

## 2020-06-26 DIAGNOSIS — I69351 Hemiplegia and hemiparesis following cerebral infarction affecting right dominant side: Secondary | ICD-10-CM | POA: Diagnosis not present

## 2020-06-26 NOTE — Therapy (Signed)
[place], [place], [address] Phone: [phone]   Fax:  [phone]  Physical Therapy Treatment  Patient Details  Name: Brad Delacruz MRN: [id number] Date of Birth: 1961-04-04 Referring Provider (PT): Lauraine Rinne    Encounter Date: 06/26/2020   PT End of Session - 06/26/20 1222    Visit Number 12    Number of Visits 24    Date for PT Re-Evaluation 07/17/20    Authorization Type BCBS no auth, no VL    Progress Note Due on Visit 20    PT Start Time 1135    PT Stop Time 1213    PT Time Calculation (min) 38 min    Equipment Utilized During Treatment Gait belt    Activity Tolerance Patient tolerated treatment well;Patient limited by fatigue    Behavior During Therapy [facility] for tasks assessed/performed           Past Medical History:  Diagnosis Date   Diabetes mellitus without complication ([place])     Past Surgical History:  Procedure Laterality Date   IR CT HEAD LTD  03/24/2020   IR CT HEAD LTD  03/24/2020   IR INTRA CRAN STENT  03/24/2020   IR PATIENT EVAL TECH 0-60 MINS  03/25/2020   IR PERCUTANEOUS ART THROMBECTOMY/INFUSION INTRACRANIAL INC DIAG ANGIO  03/24/2020   RADIOLOGY WITH ANESTHESIA N/A 03/24/2020   Procedure: IR WITH ANESTHESIA;  Surgeon: Luanne Bras, MD;  Location: [facility];  Service: Radiology;  Laterality: N/A;    There were no vitals filed for this visit.   Subjective Assessment - 06/26/20 1139    Subjective PT is not verbal    Patient is accompained by: Family member;Interpreter    Pertinent History LT MCA infarct 03/24/20, DM    Limitations Sitting;Lifting;Standing;Walking;House hold activities    Patient Stated Goals Patient doesnt speak                             [place] Adult PT Treatment/Exercise - 06/26/20 0001      Bed Mobility   Supine to Sit Moderate Assistance - Patient 50-74%    Sit to Supine Minimal Assistance - Patient > 75%       Transfers   Sit to Stand 4: Min guard;4: Min assist    Stand to Sit 4: Hydrologist Yes    Wheelchair Assistance 5: Supervision    Wheelchair Assistance Details Tactile cues for initiation;Tactile cues for sequencing    Wheelchair Propulsion Left upper extremity;Left lower extremity    Distance 40 ft    having pt make turns as well as go straight    Comments difficult and slow for pt.       Knee/Hip Exercises: Standing   Functional Squat 10 reps    Functional Squat Limitations Therapist blocking Pt Rt knee       Knee/Hip Exercises: Seated   Sit to Sand 2 sets;5 reps      Knee/Hip Exercises: Supine   Hip Adduction Isometric Both;10 reps    Bridges 10 reps    Straight Leg Raises Left;15 reps      Knee/Hip Exercises: Sidelying   Hip ABduction Left;15 reps                    PT Short Term Goals - 06/22/20 1340      PT SHORT TERM  GOAL #1   Title Patient will be independent with initial HEP and self-management strategies to improve functional outcomes    Baseline Reports compliance    Time 4    Period Weeks    Status Achieved    Target Date 06/19/20      PT SHORT TERM GOAL #2   Title Patient will be able to perform stand Mod I to demonstrate improvement in functional mobility and reduced risk for falls.    Baseline Current: Min gaurd    Time 4    Period Weeks    Status On-going    Target Date 06/19/20      PT SHORT TERM GOAL #3   Title Patient will be able to maintain standing balance up to 45 sec with use of hemi walker to demo improved functional mobility.    Baseline can stand 2 minutes with LUE assist using parallel bars    Time 4    Period Weeks    Status Partially Met    Target Date 06/19/20             PT Long Term Goals - 06/22/20 1341      PT LONG TERM GOAL #1   Title Patient will be able to perform stand x 5 in < 60 seconds to demonstrate improvement in functional mobility and reduced risk for  falls.    Baseline current 63 seconds with heavy use of LUE    Time 8    Period Weeks    Status On-going      PT LONG TERM GOAL #2   Title Patient will be able to ambulate at least 75 feet during 2MWT with LRAD to demonstrate improved ability to perform functional mobility and associated tasks.    Baseline Current 12 feet in parallell bars with Max A for RLE movement    Time 8    Period Weeks    Status On-going      PT LONG TERM GOAL #3   Title Patient will be able to maintain standing balance up to 2 min with use of hemi walker to demo improved functional mobility.    Baseline can stand 2 minutes with LUE assist using parallel bars    Time 8    Period Weeks    Status Partially Met                 Plan - 06/26/20 1223    Clinical Impression Statement PT Rt LE continues to only have a trace to 0 in all mm.  Pt is unable to complete Active motion; at this point it is most probable that pt will be wheelchair bound therefore therapist initiated wheelchair mobility.  PT able to complete but is very slow and fatigues easily.    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, DM, aphasia    Examination-Activity Limitations Bathing;Lift;Bed Mobility;Locomotion Level;Bend;Caring for Others;Self Feeding;Transfers;Reach Overhead;Carry;Sit;Dressing;Hygiene/Grooming;Stairs;Squat;Stand;Toileting    Examination-Participation Restrictions Yard Work;Driving;Community Activity;Cleaning    Rehab Potential Fair    PT Frequency 3x / week    PT Duration 8 weeks    PT Treatment/Interventions ADLs/Self Care Home Management;Aquatic Therapy;Biofeedback;Cryotherapy;Electrical Stimulation;Contrast Bath;Therapeutic exercise;Therapeutic activities;Fluidtherapy;Parrafin;Patient/family education;Orthotic Fit/Training;Manual lymph drainage;Compression bandaging;Splinting;Taping;Vasopneumatic Device;Joint Manipulations;Spinal Manipulations;Energy conservation;Dry  needling;Passive range of motion;Scar mobilization;Prosthetic Training;Wheelchair mobility training;Balance training;Neuromuscular re-education;DME Instruction;Gait training;Iontophoresis 69m/ml Dexamethasone;Moist Heat;Stair training;Traction;Functional mobility training;Ultrasound;Cognitive remediation;Manual techniques    PT Next Visit Plan Progress strength with focus on functional mobility,( most likely this will be wheel chair mobility  and transfers.)    PT Home Exercise Plan 6/23:  bridge, SAQ, sit to stands    Consulted and Agree with Plan of Care Patient;Family member/caregiver;Other (Comment)    Family Member Consulted Wife           Patient will benefit from skilled therapeutic intervention in order to improve the following deficits and impairments:  Decreased balance, Difficulty walking, Impaired UE functional use, Decreased strength, Decreased range of motion, Decreased activity tolerance, Decreased mobility, Impaired sensation, Improper body mechanics, Decreased cognition, Abnormal gait, Decreased coordination  Visit Diagnosis: Muscle weakness (generalized)  Other abnormalities of gait and mobility     Problem List Patient Active Problem List   Diagnosis Date Noted   Dysphagia, post-stroke    Essential hypertension    Benign essential HTN    SAH (subarachnoid hemorrhage) ([place]) 04/16/2020   Cerebral edema ([place]) 04/16/2020   Pneumonia 04/16/2020   Hypertensive emergency 04/16/2020   Hyperlipidemia 04/16/2020   Diabetes mellitus type II, uncontrolled ([place]) 04/16/2020   Dysphagia 04/16/2020   Protein-calorie malnutrition ([place]) 04/16/2020   Urinary retention 04/16/2020   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 [place], [place], [address] Phone: [phone]   Fax:  [phone]  Name: Brad Delacruz MRN: [id number] Date of Birth: Oct 06, 1961

## 2020-06-29 ENCOUNTER — Other Ambulatory Visit: Payer: Self-pay

## 2020-06-29 ENCOUNTER — Ambulatory Visit (HOSPITAL_COMMUNITY): Payer: BC Managed Care – PPO | Admitting: Physical Therapy

## 2020-06-29 ENCOUNTER — Encounter (HOSPITAL_COMMUNITY): Payer: Self-pay | Admitting: Physical Therapy

## 2020-06-29 DIAGNOSIS — M6281 Muscle weakness (generalized): Secondary | ICD-10-CM | POA: Diagnosis not present

## 2020-06-29 DIAGNOSIS — M25511 Pain in right shoulder: Secondary | ICD-10-CM | POA: Diagnosis not present

## 2020-06-29 DIAGNOSIS — I69351 Hemiplegia and hemiparesis following cerebral infarction affecting right dominant side: Secondary | ICD-10-CM

## 2020-06-29 DIAGNOSIS — R2689 Other abnormalities of gait and mobility: Secondary | ICD-10-CM | POA: Diagnosis not present

## 2020-06-29 NOTE — Therapy (Signed)
[place], [place], [address] Phone: [phone]   Fax:  [phone]  Physical Therapy Treatment  Patient Details  Name: Brad Delacruz MRN: [id number] Date of Birth: 06/01/1961 Referring Provider (PT): Lauraine Rinne    Encounter Date: 06/29/2020   PT End of Session - 06/29/20 1614    Visit Number 13    Number of Visits 24    Date for PT Re-Evaluation 07/17/20    Authorization Type BCBS no auth, no VL    Progress Note Due on Visit 20    PT Start Time 5625    PT Stop Time 1700    PT Time Calculation (min) 46 min    Equipment Utilized During Treatment Gait belt    Activity Tolerance Patient tolerated treatment well;Patient limited by fatigue    Behavior During Therapy [facility] for tasks assessed/performed           Past Medical History:  Diagnosis Date  . Diabetes mellitus without complication [facility])     Past Surgical History:  Procedure Laterality Date  . IR CT HEAD LTD  03/24/2020  . IR CT HEAD LTD  03/24/2020  . IR INTRA CRAN STENT  03/24/2020  . IR PATIENT EVAL TECH 0-60 MINS  03/25/2020  . IR PERCUTANEOUS ART THROMBECTOMY/INFUSION INTRACRANIAL INC DIAG ANGIO  03/24/2020  . RADIOLOGY WITH ANESTHESIA N/A 03/24/2020   Procedure: IR WITH ANESTHESIA;  Surgeon: Luanne Bras, MD;  Location: [place];  Service: Radiology;  Laterality: N/A;    There were no vitals filed for this visit.   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, DM    Limitations Sitting;Lifting;Standing;Walking;House hold activities    Patient Stated Goals Patient doesnt speak              [facility] PT Assessment - 06/29/20 0001      Assessment   Medical Diagnosis LT MCA     Referring Provider (PT) Lauraine Rinne     Onset Date/Surgical Date 03/24/20    Prior Therapy Inpatient rehab until 05/13/20                          [facility] Adult PT Treatment/Exercise - 06/29/20 0001      Transfers   Sit to Stand 4: Min guard;5: Supervision   pushing from table with L UE   Stand to Sit 4: Min guard;5: Supervision;With upper extremity assist    Stand Pivot Transfers 5: Supervision;Other (comment)   to/from WC, verbal cues   Comments R weight shifts with LUE assist- up to 60" holds - blocked knee as needed but no assist otherwise during - 10 minutes       Chief Technology Officer Yes    Wheelchair Assistance 5: Supervision    Wheelchair Assistance Details Verbal cues for sequencing;Verbal cues for technique;Visual cues/gestures for precautions/safety    Wheelchair Propulsion Left upper extremity;Left lower extremity    Distance 100 ft   straight and turning   Comments slow and labored movements; including set up for transfer and unlocking/locking Wheels      Knee/Hip Exercises: Seated   Other Seated Knee/Hip Exercises L AROM with prior hand over hand assist and R PROM - marching, heel and toe raises x20 E  PT Education - 06/29/20 1706    Education Details educated in practicing weight shfts, w/c mobility, sit to stands at home, educated wife in knee block and focusing on allowing patient to perform most of work.    Person(s) Educated Patient;Spouse    Methods Explanation;Demonstration    Comprehension Verbalized understanding;Returned demonstration            PT Short Term Goals - 06/22/20 1340      PT SHORT TERM GOAL #1   Title Patient will be independent with initial HEP and self-management strategies to improve functional outcomes    Baseline Reports compliance    Time 4    Period Weeks    Status Achieved    Target Date 06/19/20      PT SHORT TERM GOAL #2   Title Patient will be able to perform stand Mod I to demonstrate improvement in functional mobility and reduced risk for falls.    Baseline Current: Min gaurd    Time 4      Period Weeks    Status On-going    Target Date 06/19/20      PT SHORT TERM GOAL #3   Title Patient will be able to maintain standing balance up to 45 sec with use of hemi walker to demo improved functional mobility.    Baseline can stand 2 minutes with LUE assist using parallel bars    Time 4    Period Weeks    Status Partially Met    Target Date 06/19/20             PT Long Term Goals - 06/22/20 1341      PT LONG TERM GOAL #1   Title Patient will be able to perform stand x 5 in < 60 seconds to demonstrate improvement in functional mobility and reduced risk for falls.    Baseline current 63 seconds with heavy use of LUE    Time 8    Period Weeks    Status On-going      PT LONG TERM GOAL #2   Title Patient will be able to ambulate at least 75 feet during 2MWT with LRAD to demonstrate improved ability to perform functional mobility and associated tasks.    Baseline Current 12 feet in parallell bars with Max A for RLE movement    Time 8    Period Weeks    Status On-going      PT LONG TERM GOAL #3   Title Patient will be able to maintain standing balance up to 2 min with use of hemi walker to demo improved functional mobility.    Baseline can stand 2 minutes with LUE assist using parallel bars    Time 8    Period Weeks    Status Partially Met                 Plan - 06/29/20 1709    Clinical Impression Statement Focused on wheel chair mobility including set up for transfer to table. Then focused on sit to stands with weight shifts to right LE. Blocked right knee to assist as needed but tried to be as hands off as possible for patient to improve balance and strength. Educated and practiced in session with wife how to practice and instructed both husband and wife in practicing at home. Will continue to focus on functional strength and weightbearing on R LE.    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,  DM, aphasia     Examination-Activity Limitations Bathing;Lift;Bed Mobility;Locomotion Level;Bend;Caring for Others;Self Feeding;Transfers;Reach Overhead;Carry;Sit;Dressing;Hygiene/Grooming;Stairs;Squat;Stand;Toileting    Examination-Participation Restrictions Yard Work;Driving;Community Activity;Cleaning    Stability/Clinical Decision Making Evolving/Moderate complexity    Rehab Potential Fair    PT Frequency 3x / week    PT Duration 8 weeks    PT Treatment/Interventions ADLs/Self Care Home Management;Aquatic Therapy;Biofeedback;Cryotherapy;Electrical Stimulation;Contrast Bath;Therapeutic exercise;Therapeutic activities;Fluidtherapy;Parrafin;Patient/family education;Orthotic Fit/Training;Manual lymph drainage;Compression bandaging;Splinting;Taping;Vasopneumatic Device;Joint Manipulations;Spinal Manipulations;Energy conservation;Dry needling;Passive range of motion;Scar mobilization;Prosthetic Training;Wheelchair mobility training;Balance training;Neuromuscular re-education;DME Instruction;Gait training;Iontophoresis 62m/ml Dexamethasone;Moist Heat;Stair training;Traction;Functional mobility training;Ultrasound;Cognitive remediation;Manual techniques    PT Next Visit Plan Progress strength with focus on functional mobility,( most likely this will be wheel chair mobility  and transfers.)    PT Home Exercise Plan 6/23:  bridge, SAQ, sit to stands    Consulted and Agree with Plan of Care Patient;Family member/caregiver;Other (Comment)           Patient will benefit from skilled therapeutic intervention in order to improve the following deficits and impairments:  Decreased balance, Difficulty walking, Impaired UE functional use, Decreased strength, Decreased range of motion, Decreased activity tolerance, Decreased mobility, Impaired sensation, Improper body mechanics, Decreased cognition, Abnormal gait, Decreased coordination  Visit Diagnosis: Muscle weakness (generalized)  Other abnormalities of gait and  mobility  Hemiplegia and hemiparesis following cerebral infarction affecting right dominant side ([facility]     Problem List Patient Active Problem List   Diagnosis Date Noted  . Dysphagia, post-stroke   . Essential hypertension   . Benign essential HTN   . SAH (subarachnoid hemorrhage) ([place] 04/16/2020  . Cerebral edema ([facility] 04/16/2020  . Pneumonia 04/16/2020  . Hypertensive emergency 04/16/2020  . Hyperlipidemia 04/16/2020  . Diabetes mellitus type II, uncontrolled ([place] 04/16/2020  . Dysphagia 04/16/2020  . Protein-calorie malnutrition ([facility] 04/16/2020  . Urinary retention 04/16/2020  . Left middle cerebral artery stroke ([facility] 04/16/2020  . Acute respiratory failure ([facility]   . Acute ischemic stroke ([place] L MCA d/t L M1 oclusion s/p MCA stent 03/24/2020  . CVA (cerebral vascular accident) ([facility] 03/24/2020  . Middle cerebral artery embolism, left 03/24/2020   5:11 PM, 06/29/20 MJerene Pitch DPT [facility] [phone]  [place] [address]Phone: [phone]  Fax:  [phone] Name: LFender HerderMRN: [id number] of Birth: [birth year]

## 2020-06-30 ENCOUNTER — Other Ambulatory Visit: Payer: Self-pay

## 2020-06-30 ENCOUNTER — Encounter: Payer: BC Managed Care – PPO | Attending: Registered Nurse | Admitting: Physical Medicine & Rehabilitation

## 2020-06-30 ENCOUNTER — Encounter: Payer: Self-pay | Admitting: Physical Medicine & Rehabilitation

## 2020-06-30 ENCOUNTER — Ambulatory Visit (HOSPITAL_COMMUNITY): Payer: BC Managed Care – PPO | Admitting: Occupational Therapy

## 2020-06-30 VITALS — BP 115/74 | HR 87 | Temp 98.5°F | Ht 65.0 in

## 2020-06-30 DIAGNOSIS — E7849 Other hyperlipidemia: Secondary | ICD-10-CM | POA: Diagnosis not present

## 2020-06-30 DIAGNOSIS — M25511 Pain in right shoulder: Secondary | ICD-10-CM

## 2020-06-30 DIAGNOSIS — I63512 Cerebral infarction due to unspecified occlusion or stenosis of left middle cerebral artery: Secondary | ICD-10-CM | POA: Insufficient documentation

## 2020-06-30 DIAGNOSIS — M6281 Muscle weakness (generalized): Secondary | ICD-10-CM | POA: Diagnosis not present

## 2020-06-30 DIAGNOSIS — I10 Essential (primary) hypertension: Secondary | ICD-10-CM | POA: Diagnosis not present

## 2020-06-30 DIAGNOSIS — I69351 Hemiplegia and hemiparesis following cerebral infarction affecting right dominant side: Secondary | ICD-10-CM | POA: Diagnosis not present

## 2020-06-30 DIAGNOSIS — R2689 Other abnormalities of gait and mobility: Secondary | ICD-10-CM | POA: Diagnosis not present

## 2020-06-30 NOTE — Patient Instructions (Signed)
Referral placed to neurology

## 2020-06-30 NOTE — Progress Notes (Signed)
Subjective:    Patient ID: Brad Delacruz, male    DOB: [birth year], 60 y.o.   MRN: [id number] 59 y.o. right-handed male with history of diabetes mellitus and hypertension.  Per chart review lives with spouse independent prior to admission.  Presented 03/24/2020 with right-sided weakness and aphasia to [facility].  Admission chemistries alcohol negative sodium 130 potassium 3.3 glucose 309 hemoglobin A1c 9.2 urine drug screen positive benzos.  Cranial CT scan negative for acute changes.  Remote infarct of the left corona radiata internal capsule.  CT cerebral perfusion scan as well as CT angiogram of head and neck showed a large left MCA territory nonhemorrhagic infarct as well as acute left M1 large vessel occlusion.  Patient was transferred to [facility].  Patient underwent TPA followed by mechanical thrombectomy revascularization per interventional radiology.  Echocardiogram with ejection fraction of 60% grade 1 diastolic dysfunction.  TCD bubble study positive for small PFO and lower extremity Dopplers negative for DVT.  Patient remained intubated through 04/01/2020.  Latest follow-up cranial CT scan 04/08/2020 showed evolving large left MCA territory infarct with similar mass-effect no new hemorrhage or hydrocephalus.  Maintained on low-dose aspirin as well as Brilinta for CVA prophylaxis.  Considerations to be made for TEE and loop recorder as outpatient.  Subcutaneous heparin for DVT prophylaxis.  Nasogastric tube for nutritional support diet slowly advanced.  He did complete a course of Ancef 04/05/2020 for pneumonia.  Patient was admitted for a comprehensive rehab program  Admit date: 04/16/2020 Discharge date: 05/13/2020  HPI  59 year old male with large left MCA distribution infarct causing right hemiplegia and aphasia.  He completed inpatient rehabilitation at [facility] stroke program and is here for follow-up ointment.  The patient has seen his primary care provider but not the  neurologist.  His family helps with his care at home.  They also provide transportation for him. According to the daughter he is starting to understand a little bit more in both [place] and [place].  A Spanish language interpreter is in the room with [name].   Pt's insurance only pays PT, OT.  Needs help with bathing and dressing RIght shoulder pain better with tape and sling Starting to walk with PT  Pain Inventory Average Pain 0 Pain Right Now 0 My pain is intermittent and aching  In the last 24 hours, has pain interfered with the following? General activity 0 Relation with others 5 Enjoyment of life 0 What TIME of day is your pain at its worst? night Sleep (in general) Fair  Pain is worse with: laying down Pain improves with: medication Relief from Meds: Pain goes away with meds.  Mobility ability to climb steps?  no do you drive?  no use a wheelchair Do you have any goals in this area?  yes  Function disabled: date disabled 03/24/2020 I need assistance with the following:  bathing, toileting, meal prep, household duties and shopping Do you have any goals in this area?  yes  Neuro/Psych spasms  Prior Studies Any changes since last visit?  no  Physicians involved in your care Any changes since last visit?  no   Family History  Problem Relation Age of Onset  . Stroke Mother   . Hypertension Mother   . Heart attack Father   . Diabetes Father    Social History   Socioeconomic History  . Marital status: Married    Spouse name: Not on file  . Number of children: Not on file  .  Years of education: Not on file  . Highest education level: Not on file  Occupational History  . Not on file  Tobacco Use  . Smoking status: Never Smoker  . Smokeless tobacco: Never Used  Substance and Sexual Activity  . Alcohol use: Not Currently  . Drug use: Never  . Sexual activity: Not on file  Other Topics Concern  . Not on file  Social History Narrative  . Not on file    Social Determinants of Health   Financial Resource Strain:   . Difficulty of Paying Living Expenses:   Food Insecurity:   . Worried About Programme researcher, broadcasting/film/video in the Last Year:   . Barista in the Last Year:   Transportation Needs:   . Freight forwarder (Medical):   Marland Kitchen Lack of Transportation (Non-Medical):   Physical Activity:   . Days of Exercise per Week:   . Minutes of Exercise per Session:   Stress:   . Feeling of Stress :   Social Connections:   . Frequency of Communication with Friends and Family:   . Frequency of Social Gatherings with Friends and Family:   . Attends Religious Services:   . Active Member of Clubs or Organizations:   . Attends Banker Meetings:   Marland Kitchen Marital Status:    Past Surgical History:  Procedure Laterality Date  . IR CT HEAD LTD  03/24/2020  . IR CT HEAD LTD  03/24/2020  . IR INTRA CRAN STENT  03/24/2020  . IR PATIENT EVAL TECH 0-60 MINS  03/25/2020  . IR PERCUTANEOUS ART THROMBECTOMY/INFUSION INTRACRANIAL INC DIAG ANGIO  03/24/2020  . RADIOLOGY WITH ANESTHESIA N/A 03/24/2020   Procedure: IR WITH ANESTHESIA;  Surgeon: Julieanne Cotton, MD;  Location: [facility] OR;  Service: Radiology;  Laterality: N/A;   Past Medical History:  Diagnosis Date  . Diabetes mellitus without complication (HCC)    BP 115/74   Pulse 87   Temp 98.5 F (36.9 C)   Ht 5\' 5"  (1.651 m)   SpO2 97%   BMI 28.84 kg/m   Opioid Risk Score:   Fall Risk Score:  `1  Depression screen PHQ 2/9  Depression screen PHQ 2/9 05/25/2020  Decreased Interest 0  Down, Depressed, Hopeless 0  PHQ - 2 Score 0  Altered sleeping 0  Tired, decreased energy 0  Change in appetite 0  Feeling bad or failure about yourself  0  Trouble concentrating 1  Moving slowly or fidgety/restless 0  Suicidal thoughts 0  PHQ-9 Score 1   Review of Systems  Constitutional: Negative.   HENT: Negative.   Eyes: Negative.   Respiratory: Negative.   Cardiovascular: Negative.    Gastrointestinal: Negative.   Endocrine: Negative.   Genitourinary: Negative.   Musculoskeletal: Positive for gait problem.       Right knee & right leg  Skin: Negative.   Allergic/Immunologic: Negative.   Neurological: Positive for weakness.       Objective:   Physical Exam Vitals and nursing note reviewed.  Constitutional:      Appearance: He is normal weight.  HENT:     Head: Normocephalic and atraumatic.  Eyes:     Extraocular Movements: Extraocular movements intact.     Conjunctiva/sclera: Conjunctivae normal.     Pupils: Pupils are equal, round, and reactive to light.  Musculoskeletal:     Cervical back: Normal range of motion.     Comments: No pain right shoulder range of motion during abduction  or external rotation.  No pain with elbow wrist or hand range of motion no right upper extremity swelling  Skin:    General: Skin is warm and dry.     Comments: Skin dystrophic changes or nail changes right knee  Neurological:     General: No focal deficit present.     Mental Status: He is alert and oriented to person, place, and time.     Comments: Motor strength is 0/5 in the right deltoid bicep tricep grip or the right hip flexor knee extensor ankle dorsiflexor plantar flexor Winces to pinch right lower limb Normal strength left upper and left lower extremity  Psychiatric:        Mood and Affect: Mood normal.        Behavior: Behavior normal.   Tone reduced RUE        Assessment & Plan:  #1.  Large left MCA distribution infarct with global aphasia and right hemiplegia.  Continue OT PT.  He would benefit from speech therapy however does not have insurance benefits for this.  Have spoken to daughter about the [facility] aphasia program and she will reach out to them. The patient also needs to follow-up with neurology and have made referral for that. Physical medicine rehab follow-up in 6 weeks Extra time taken due to aphasia as well as interpreter

## 2020-07-01 ENCOUNTER — Ambulatory Visit (HOSPITAL_COMMUNITY): Payer: BC Managed Care – PPO | Admitting: Physical Therapy

## 2020-07-01 DIAGNOSIS — R2689 Other abnormalities of gait and mobility: Secondary | ICD-10-CM | POA: Diagnosis not present

## 2020-07-01 DIAGNOSIS — I69351 Hemiplegia and hemiparesis following cerebral infarction affecting right dominant side: Secondary | ICD-10-CM | POA: Diagnosis not present

## 2020-07-01 DIAGNOSIS — M6281 Muscle weakness (generalized): Secondary | ICD-10-CM

## 2020-07-01 DIAGNOSIS — M25511 Pain in right shoulder: Secondary | ICD-10-CM | POA: Diagnosis not present

## 2020-07-01 NOTE — Therapy (Signed)
[place], [place], [address] Phone: [phone]   Fax:  [phone]  Physical Therapy Treatment  Patient Details  Name: Brad Delacruz MRN: [id number] Date of Birth: 1960/12/27 Referring Provider (PT): Lauraine Rinne    Encounter Date: 07/01/2020   PT End of Session - 07/01/20 1620    Visit Number 14    Number of Visits 24    Date for PT Re-Evaluation 07/17/20    Authorization Type BCBS no auth, no VL    Progress Note Due on Visit 20    PT Start Time 1537    PT Stop Time 1610    PT Time Calculation (min) 33 min    Equipment Utilized During Treatment Gait belt    Activity Tolerance Patient tolerated treatment well;Patient limited by fatigue    Behavior During Therapy [facility] for tasks assessed/performed           Past Medical History:  Diagnosis Date  . Diabetes mellitus without complication [facility])     Past Surgical History:  Procedure Laterality Date  . IR CT HEAD LTD  03/24/2020  . IR CT HEAD LTD  03/24/2020  . IR INTRA CRAN STENT  03/24/2020  . IR PATIENT EVAL TECH 0-60 MINS  03/25/2020  . IR PERCUTANEOUS ART THROMBECTOMY/INFUSION INTRACRANIAL INC DIAG ANGIO  03/24/2020  . RADIOLOGY WITH ANESTHESIA N/A 03/24/2020   Procedure: IR WITH ANESTHESIA;  Surgeon: Luanne Bras, MD;  Location: [place];  Service: Radiology;  Laterality: N/A;    There were no vitals filed for this visit.                      [place] Adult PT Treatment/Exercise - 07/01/20 0001      Transfers   Sit to Stand 4: Min guard;5: Supervision   pushing from table with L UE   Stand to Sit 4: Min guard;5: Supervision;With upper extremity assist    Stand Pivot Transfers 5: Supervision;Other (comment)   to/from [facility], Sports administrator Yes    Wheelchair Assistance 5: Supervision    Wheelchair Propulsion Left upper extremity;Left lower extremity    Distance 110  ft     Comments  improved from first time on 7/23 but still slow and labored      Exercises   Exercises Knee/Hip      Knee/Hip Exercises: Standing   Functional Squat 10 reps    Functional Squat Limitations Therapist blocking Pt Rt knee     Other Standing Knee Exercises wt shifting       Knee/Hip Exercises: Seated   Sit to Sand 10 reps   standing for 15 seconds for standing balance at each rep      Knee/Hip Exercises: Supine   Bridges 10 reps    Other Supine Knee/Hip Exercises isometric hip ab/adduction x 10 each       Knee/Hip Exercises: Sidelying   Other Sidelying Knee/Hip Exercises attempted Lt sidelying wedge under Rt LE for gravity assisted knee flexion             sitting balance using boom chuckers.         PT Short Term Goals - 06/22/20 1340      PT SHORT TERM GOAL #1   Title Patient will be independent with initial HEP and self-management strategies to improve functional outcomes    Baseline Reports compliance    Time 4  Period Weeks    Status Achieved    Target Date 06/19/20      PT SHORT TERM GOAL #2   Title Patient will be able to perform stand Mod I to demonstrate improvement in functional mobility and reduced risk for falls.    Baseline Current: Min gaurd    Time 4    Period Weeks    Status On-going    Target Date 06/19/20      PT SHORT TERM GOAL #3   Title Patient will be able to maintain standing balance up to 45 sec with use of hemi walker to demo improved functional mobility.    Baseline can stand 2 minutes with LUE assist using parallel bars    Time 4    Period Weeks    Status Partially Met    Target Date 06/19/20             PT Long Term Goals - 06/22/20 1341      PT LONG TERM GOAL #1   Title Patient will be able to perform stand x 5 in < 60 seconds to demonstrate improvement in functional mobility and reduced risk for falls.    Baseline current 63 seconds with heavy use of LUE    Time 8    Period Weeks    Status On-going      PT LONG TERM  GOAL #2   Title Patient will be able to ambulate at least 75 feet during 2MWT with LRAD to demonstrate improved ability to perform functional mobility and associated tasks.    Baseline Current 12 feet in parallell bars with Max A for RLE movement    Time 8    Period Weeks    Status On-going      PT LONG TERM GOAL #3   Title Patient will be able to maintain standing balance up to 2 min with use of hemi walker to demo improved functional mobility.    Baseline can stand 2 minutes with LUE assist using parallel bars    Time 8    Period Weeks    Status Partially Met                 Plan - 07/01/20 1620    Clinical Impression Statement Pt now has a trace of hip ab/adduction with 0 hip flexion, knee extension and ankle motion.  Pt most likely will not be ambulatory again focus should remain on transfers, bed mobility and wheelchair mobility.    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, DM, aphasia    Examination-Activity Limitations Bathing;Lift;Bed Mobility;Locomotion Level;Bend;Caring for Others;Self Feeding;Transfers;Reach Overhead;Carry;Sit;Dressing;Hygiene/Grooming;Stairs;Squat;Stand;Toileting    Examination-Participation Restrictions Yard Work;Driving;Community Activity;Cleaning    Stability/Clinical Decision Making Evolving/Moderate complexity    Rehab Potential Fair    PT Frequency 3x / week    PT Duration 8 weeks    PT Treatment/Interventions ADLs/Self Care Home Management;Aquatic Therapy;Biofeedback;Cryotherapy;Electrical Stimulation;Contrast Bath;Therapeutic exercise;Therapeutic activities;Fluidtherapy;Parrafin;Patient/family education;Orthotic Fit/Training;Manual lymph drainage;Compression bandaging;Splinting;Taping;Vasopneumatic Device;Joint Manipulations;Spinal Manipulations;Energy conservation;Dry needling;Passive range of motion;Scar mobilization;Prosthetic Training;Wheelchair mobility training;Balance training;Neuromuscular  re-education;DME Instruction;Gait training;Iontophoresis 6m/ml Dexamethasone;Moist Heat;Stair training;Traction;Functional mobility training;Ultrasound;Cognitive remediation;Manual techniques    PT Next Visit Plan Progress strength with focus on functional mobility,( most likely this will be wheel chair mobility  and transfers.)    PT Home Exercise Plan 6/23:  bridge, SAQ, sit to stands    Consulted and Agree with Plan of Care Patient;Family member/caregiver;Other (Comment)           Patient  will benefit from skilled therapeutic intervention in order to improve the following deficits and impairments:  Decreased balance, Difficulty walking, Impaired UE functional use, Decreased strength, Decreased range of motion, Decreased activity tolerance, Decreased mobility, Impaired sensation, Improper body mechanics, Decreased cognition, Abnormal gait, Decreased coordination  Visit Diagnosis: Muscle weakness (generalized)     Problem List Patient Active Problem List   Diagnosis Date Noted  . Dysphagia, post-stroke   . Essential hypertension   . Benign essential HTN   . SAH (subarachnoid hemorrhage) ([place]) 04/16/2020  . Cerebral edema ([place]) 04/16/2020  . Pneumonia 04/16/2020  . Hypertensive emergency 04/16/2020  . Hyperlipidemia 04/16/2020  . Diabetes mellitus type II, uncontrolled ([place]) 04/16/2020  . Dysphagia 04/16/2020  . Protein-calorie malnutrition ([place]) 04/16/2020  . Urinary retention 04/16/2020  . Left middle cerebral artery stroke ([place]) 04/16/2020  . Acute respiratory failure ([place])   . Acute ischemic stroke ([place]) L MCA d/t L M1 oclusion s/p MCA stent 03/24/2020  . CVA (cerebral vascular accident) ([place]) 03/24/2020  . Middle cerebral artery embolism, left 03/24/2020    Rayetta Humphrey, PT CLT [phone] 07/01/2020, 4:22 PM  [place] [address] [place], [place], [address] Phone: [phone]   Fax:  [phone]  Name: Brad Delacruz MRN: [id number] Date of Birth: 05/21/1961

## 2020-07-02 ENCOUNTER — Encounter (HOSPITAL_COMMUNITY): Payer: Self-pay | Admitting: Occupational Therapy

## 2020-07-02 ENCOUNTER — Ambulatory Visit (HOSPITAL_COMMUNITY): Payer: BC Managed Care – PPO | Admitting: Occupational Therapy

## 2020-07-02 ENCOUNTER — Other Ambulatory Visit: Payer: Self-pay

## 2020-07-02 DIAGNOSIS — M25511 Pain in right shoulder: Secondary | ICD-10-CM | POA: Diagnosis not present

## 2020-07-02 DIAGNOSIS — M6281 Muscle weakness (generalized): Secondary | ICD-10-CM | POA: Diagnosis not present

## 2020-07-02 DIAGNOSIS — I10 Essential (primary) hypertension: Secondary | ICD-10-CM | POA: Diagnosis not present

## 2020-07-02 DIAGNOSIS — I69351 Hemiplegia and hemiparesis following cerebral infarction affecting right dominant side: Secondary | ICD-10-CM | POA: Diagnosis not present

## 2020-07-02 DIAGNOSIS — E782 Mixed hyperlipidemia: Secondary | ICD-10-CM | POA: Diagnosis not present

## 2020-07-02 DIAGNOSIS — I639 Cerebral infarction, unspecified: Secondary | ICD-10-CM | POA: Diagnosis not present

## 2020-07-02 DIAGNOSIS — E1165 Type 2 diabetes mellitus with hyperglycemia: Secondary | ICD-10-CM | POA: Diagnosis not present

## 2020-07-02 DIAGNOSIS — R2689 Other abnormalities of gait and mobility: Secondary | ICD-10-CM | POA: Diagnosis not present

## 2020-07-02 NOTE — Therapy (Signed)
[place] [facility] [address] [place], [place], [address] Phone: [phone]   Fax:  [phone]  Occupational Therapy Treatment  Patient Details  Name: Brad Delacruz MRN: [id number] Date of Birth: [birth year] Referring Provider (OT): Mariam Dollar, [place]    Encounter Date: 07/02/2020   OT End of Session - 07/02/20 1835    Visit Number 12    Number of Visits 24    Date for OT Re-Evaluation 08/21/20   mini reassess completed 7/29   Authorization Type BCBS PPO    Authorization Time Period no auth no viist limit    OT Start Time 1728    OT Stop Time 1820    OT Time Calculation (min) 52 min    Activity Tolerance Patient tolerated treatment well    Behavior During Therapy [facility] for tasks assessed/performed           Past Medical History:  Diagnosis Date  . Diabetes mellitus without complication [facility])     Past Surgical History:  Procedure Laterality Date  . IR CT HEAD LTD  03/24/2020  . IR CT HEAD LTD  03/24/2020  . IR INTRA CRAN STENT  03/24/2020  . IR PATIENT EVAL TECH 0-60 MINS  03/25/2020  . IR PERCUTANEOUS ART THROMBECTOMY/INFUSION INTRACRANIAL INC DIAG ANGIO  03/24/2020  . RADIOLOGY WITH ANESTHESIA N/A 03/24/2020   Procedure: IR WITH ANESTHESIA;  Surgeon: Julieanne Cotton, MD;  [facility];  Service: Radiology;  Laterality: N/A;    There were no vitals filed for this visit.       [facility] OT Assessment - 07/02/20 1749      Assessment   Medical Diagnosis LT MCA     Referring Provider (OT) Mariam Dollar, PA       ADL   ADL comments Barthel score 60 indicating Minimally dependent      Cognition   Cognition Comments Unable to assess via formal testing due to aphasia      ROM / Strength   AROM / PROM / Strength AROM;PROM      Palpation   Palpation comment 1 finger width right shoulder subluxation      AROM   Overall AROM Comments 0 A/ROM in RUE      PROM   Overall PROM Comments P/ROM measured this date in seated,  er/IR adducted    PROM Assessment Site Shoulder    Right/Left Shoulder Right    Right Shoulder Flexion 104 Degrees    Right Shoulder ABduction 73 Degrees    Right Shoulder Internal Rotation 90 Degrees    Right Shoulder External Rotation 41 Degrees      RUE Tone   RUE Tone Brunnstrom Scale    Brunnstrom Scale (RUE) No increase in muscle tone                    OT Treatments/Exercises (OP) - 07/02/20 1837      Bed Mobility   Bed Mobility Supine to Sit;Sit to Supine    Supine to Sit Moderate Assistance - Patient 50-74%    Sit to Supine Minimal Assistance - Patient > 75%      Transfers   Transfers Sit to Stand;Stand to Sit    Sit to Stand 5: Supervision;4: Min guard    Stand to Sit 5: Supervision;4: Min guard      ADLs   UB Dressing Pt doffing shirt with supervision. Donning shirt with Min A with support at RUE from wife. Wife also providing Mod  cues for problem solving to sequence donning of shirt. Min cues from OT    ADL Comments Facilitating tapping of pt wife holding and support RUE and duaghter [name]) applying tape. Min cues provided for sequencing and technique      Neurological Re-education Exercises   Shoulder Flexion PROM;15 reps;Supine;Self ROM;10 reps    Shoulder ABduction PROM;15 reps;Supine;Self ROM;10 reps    Shoulder External Rotation PROM;15 reps    Shoulder Internal Rotation PROM;10 reps    Elbow Flexion PROM;15 reps    Elbow Extension PROM;15 reps      Manual Therapy   Manual Therapy Myofascial release;Taping    Manual therapy comments Manual therapy completed prior to exercises.     Myofascial Release Myofascial release and manual stretching completed to right UE (shoulder and upper arm) to decrease fascial restrictions and increase joint mobility in a pain free zone.      Kinesiotex Facilitate Muscle   Decrease subluxation                   OT Short Term Goals - 06/05/20 1651      OT SHORT TERM GOAL #1   Title Patient will be  educated on a HEP for ADL completion and improved RUE function.    Time 6    Period Weeks    Status On-going    Target Date 07/02/20      OT SHORT TERM GOAL #2   Title Paitent will complete dressing, bathing, grooming, and toileting with min pa.    Time 6    Period Weeks    Status On-going      OT SHORT TERM GOAL #3   Title Patient will propel wheelchair independently and safely household distances.    Time 6    Period Weeks    Status On-going      OT SHORT TERM GOAL #4   Title Patients vision will be assessed for deficits.    Time 6    Period Weeks    Status On-going      OT SHORT TERM GOAL #5   Title Patient will use RUE as a gross assist during ADL completion and transfers.    Time 6    Period Weeks    Status On-going      OT SHORT TERM GOAL #6   Title Patient will weightbear on his RUE with mod  facilitation to maintain positioning while shifting weight on and off of RUE during functional tasks.    Time 6    Period Weeks    Status On-going             OT Long Term Goals - 06/05/20 1651      OT LONG TERM GOAL #1   Title Patient will complete B/IADLS at highest level of independence possible.    Time 12    Period Weeks    Status On-going      OT LONG TERM GOAL #2   Title Patent will complete dressing, bathing, grooming, and toileting independently.    Time 12    Period Weeks    Status On-going      OT LONG TERM GOAL #3   Title Paitent will be educated and independent with compensatory techniques for visual deficits.    Time 12    Period Weeks    Status On-going      OT LONG TERM GOAL #4   Title Patient will use his RUE as an active assist during ADL completion.  Time 12    Period Weeks    Status On-going      OT LONG TERM GOAL #5   Title Paitent will weightbear on his RUE and weightshift during ADLs independently.    Time 12    Period Weeks    Status On-going      OT LONG TERM GOAL #6   Title Patient will improve tone in RUE from  Brunnstrom I to III in order to use RUE actively during adl completion.    Time 12    Period Weeks    Status On-going                 Plan - 07/02/20 1842    Clinical Impression Statement A: Starting with myofascial release and PROM in supine to reduce pain and increase ROM. Noting that patient with continued decrease of pain during manual therapy and increased PROM. Pt performing self PROM in supine for forward flexion. Continued education on donning/doffing of shirt. Pt doffing shirt with Supervision. Wife assistance in donning of shirt with Min A for support of RUE and Mod cues for sequencing. Continued education on K-tape for decreasing pain and subluxation of shoulder and wife and daughter. Performing mini-reassessment including measurements of PROM for RUE and Barthel Index for independence. Pt has demonstrating increased tolerance for PROM and decreased pain at R shoulder - at beginning of treatment, pt was unable to tolerance minimal movement at RUE and light touch at R shoulder, chest, and upper arm (pain even with elbow flexion/extension). Pt scoring a 60 on the Barthel Index indicating "minimally dependent." Pt performing transfer to/from w/c with Min guard A for safety and Min cues for sequencing. Discussed desire for more OT and benefits of further education on compensatory techniques for ADLs and to decrease pain at RUE.    Body Structure / Function / Physical Skills ADL;Decreased knowledge of use of DME;Strength;Tone;Pain;GMC;Dexterity;Balance;UE functional use;ROM;IADL;Vision;Coordination;FMC    Plan P: Continue myofasical release, scapular ROM, PROM of RUE, and weight bearing. Continue K-tape education with patient and family. Follow up on donning/doffing of shirt. Attempt forward flexion and weight shift with blue therapy ball.    OT Home Exercise Plan eval: weighbearing, functional adl completion. 7/29 family performing K-taping    Consulted and Agree with Plan of Care  Patient;Family member/caregiver    Family Member Consulted wife [name]) and daughter [name])           Patient will benefit from skilled therapeutic intervention in order to improve the following deficits and impairments:   Body Structure / Function / Physical Skills: ADL, Decreased knowledge of use of DME, Strength, Tone, Pain, GMC, Dexterity, Balance, UE functional use, ROM, IADL, Vision, Coordination, [facility]       Visit Diagnosis: Hemiplegia and hemiparesis following cerebral infarction affecting right dominant side (HCC)  Acute pain of right shoulder    Problem List Patient Active Problem List   Diagnosis Date Noted  . Dysphagia, post-stroke   . Essential hypertension   . Benign essential HTN   . SAH (subarachnoid hemorrhage) (HCC) 04/16/2020  . Cerebral edema (HCC) 04/16/2020  . Pneumonia 04/16/2020  . Hypertensive emergency 04/16/2020  . Hyperlipidemia 04/16/2020  . Diabetes mellitus type II, uncontrolled (HCC) 04/16/2020  . Dysphagia 04/16/2020  . Protein-calorie malnutrition (HCC) 04/16/2020  . Urinary retention 04/16/2020  . 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 Acute respiratory failure (HCC)   . Acute ischemic stroke (HCC) L MCA d/t L M1 oclusion s/p MCA stent  03/24/2020  . CVA (cerebral vascular accident) (HCC) 03/24/2020  . Middle cerebral artery embolism, left 03/24/2020    Gabriel Rungharis M Micala Saltsman, MSOT, OTR/L 07/02/2020, 6:51 PM  [place] [facility] [address] [place], [place], [address] Phone: [phone]   Fax:  [phone]  Name: Signe ColtLeopoldo Wagar MRN: [id number] Date of Birth: [birth year]

## 2020-07-02 NOTE — Therapy (Signed)
[facility] [address] [place], [place], [address] Phone: [phone]   Fax:  [phone]  Occupational Therapy Treatment  Patient Details  Name: Brad Delacruz MRN: [id number] Date of Birth: [birth year] Referring Provider (OT): Mariam Dollar, [place]    Encounter Date: 06/30/2020    Past Medical History:  Diagnosis Date  . Diabetes mellitus without complication [facility])     Past Surgical History:  Procedure Laterality Date  . IR CT HEAD LTD  03/24/2020  . IR CT HEAD LTD  03/24/2020  . IR INTRA CRAN STENT  03/24/2020  . IR PATIENT EVAL TECH 0-60 MINS  03/25/2020  . IR PERCUTANEOUS ART THROMBECTOMY/INFUSION INTRACRANIAL INC DIAG ANGIO  03/24/2020  . RADIOLOGY WITH ANESTHESIA N/A 03/24/2020   Procedure: IR WITH ANESTHESIA;  Surgeon: Julieanne Cotton, MD;  [facility];  Service: Radiology;  Laterality: N/A;    There were no vitals filed for this visit.   Subjective Assessment - 07/02/20 0808    Subjective  S: Seeing progress with pain    Pertinent History [name] was at work on 03/24/20 and had an accident, resulting in a CVA with RUE and RLE hemiparesis and expressive aphasia.  He was admitted to CIR and recieved extensive PT, OT, ST and was dc home on 05/13/20.  He has been referred to OT for evaluation and treatment.    Currently in Pain? Yes    Pain Score 5    FACE   Pain Location Shoulder    Pain Orientation Right    Pain Descriptors / Indicators Aching;Moaning;Grimacing                                OT Education - 07/02/20 1141    Education Details Educating daughter, [name], and wife on tapping techniques. Letting them perform taping with Mod VCs and education on techniques.    Person(s) Educated Patient;Spouse;Child(ren)    Methods Explanation;Demonstration    Comprehension Verbalized understanding            OT Short Term Goals - 06/05/20 1651      OT SHORT TERM GOAL #1   Title Patient will  be educated on a HEP for ADL completion and improved RUE function.    Time 6    Period Weeks    Status On-going    Target Date 07/02/20      OT SHORT TERM GOAL #2   Title Paitent will complete dressing, bathing, grooming, and toileting with min pa.    Time 6    Period Weeks    Status On-going      OT SHORT TERM GOAL #3   Title Patient will propel wheelchair independently and safely household distances.    Time 6    Period Weeks    Status On-going      OT SHORT TERM GOAL #4   Title Patients vision will be assessed for deficits.    Time 6    Period Weeks    Status On-going      OT SHORT TERM GOAL #5   Title Patient will use RUE as a gross assist during ADL completion and transfers.    Time 6    Period Weeks    Status On-going      OT SHORT TERM GOAL #6   Title Patient will weightbear on his RUE with mod  facilitation to maintain positioning while shifting weight on and off  of RUE during functional tasks.    Time 6    Period Weeks    Status On-going             OT Long Term Goals - 06/05/20 1651      OT LONG TERM GOAL #1   Title Patient will complete B/IADLS at highest level of independence possible.    Time 12    Period Weeks    Status On-going      OT LONG TERM GOAL #2   Title Patent will complete dressing, bathing, grooming, and toileting independently.    Time 12    Period Weeks    Status On-going      OT LONG TERM GOAL #3   Title Paitent will be educated and independent with compensatory techniques for visual deficits.    Time 12    Period Weeks    Status On-going      OT LONG TERM GOAL #4   Title Patient will use his RUE as an active assist during ADL completion.    Time 12    Period Weeks    Status On-going      OT LONG TERM GOAL #5   Title Paitent will weightbear on his RUE and weightshift during ADLs independently.    Time 12    Period Weeks    Status On-going      OT LONG TERM GOAL #6   Title Patient will improve tone in RUE from  Brunnstrom I to III in order to use RUE actively during adl completion.    Time 12    Period Weeks    Status On-going                 Plan - 07/02/20 1145    Clinical Impression Statement A: Starting with myofascial release and PROM in supine to reduce pain and increase ROM. Noting that patient with decrease pain during manual therapy and increased PROM. Continuing scapular mobilization with trunk flexion/extension and weight bearing during funcitonal reach. Wife and pt reporting he has been practicing donning/doffing shirt at home; finds it easier to doff and requires assistance for donning. Continued practice donning/doffing of shirt with Min Guard-Min A and Mod-Max cues; 1 reps. Educating wife and daughter on K-tape technique. Daughter applying K-tape with assistance and wife assisting with application of K-tape. Will conitnue to education on technique.    Body Structure / Function / Physical Skills ADL;Decreased knowledge of use of DME;Strength;Tone;Pain;GMC;Dexterity;Balance;UE functional use;ROM;IADL;Vision;Coordination;FMC    Plan P: Continue myofasical release, scapular ROM, PROM of RUE, and weight bearing. Continue K-tape education with patient and family. Follow up on donning/doffing of shirt.    OT Home Exercise Plan eval: weighbearing, functional adl completion    Consulted and Agree with Plan of Care Patient;Family member/caregiver    Family Member Consulted wife [name]) and daughter [name])           Patient will benefit from skilled therapeutic intervention in order to improve the following deficits and impairments:   Body Structure / Function / Physical Skills: ADL, Decreased knowledge of use of DME, Strength, Tone, Pain, GMC, Dexterity, Balance, UE functional use, ROM, IADL, Vision, Coordination, [facility]       Visit Diagnosis: Hemiplegia and hemiparesis following cerebral infarction affecting right dominant side (HCC)  Acute pain of right shoulder    Problem  List Patient Active Problem List   Diagnosis Date Noted  . Dysphagia, post-stroke   . Essential hypertension   . Benign essential  HTN   . SAH (subarachnoid hemorrhage) (HCC) 04/16/2020  . Cerebral edema (HCC) 04/16/2020  . Pneumonia 04/16/2020  . Hypertensive emergency 04/16/2020  . Hyperlipidemia 04/16/2020  . Diabetes mellitus type II, uncontrolled (HCC) 04/16/2020  . Dysphagia 04/16/2020  . Protein-calorie malnutrition (HCC) 04/16/2020  . Urinary retention 04/16/2020  . 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 Acute respiratory failure (HCC)   . Acute ischemic stroke (HCC) L MCA d/t L M1 oclusion s/p MCA stent 03/24/2020  . CVA (cerebral vascular accident) (HCC) 03/24/2020  . Middle cerebral artery embolism, left 03/24/2020    Gabriel Rung, MSOT, OTR/L 07/02/2020, 11:51 AM  [facility] [address] [place], [place], [address] Phone: [phone]   Fax:  [phone]  Name: Brolin Dambrosia MRN: [id number] Date of Birth: 02-07-1961

## 2020-07-03 ENCOUNTER — Ambulatory Visit (HOSPITAL_COMMUNITY): Payer: BC Managed Care – PPO

## 2020-07-03 ENCOUNTER — Encounter (HOSPITAL_COMMUNITY): Payer: Self-pay

## 2020-07-03 DIAGNOSIS — R2689 Other abnormalities of gait and mobility: Secondary | ICD-10-CM

## 2020-07-03 DIAGNOSIS — M6281 Muscle weakness (generalized): Secondary | ICD-10-CM | POA: Diagnosis not present

## 2020-07-03 DIAGNOSIS — M25511 Pain in right shoulder: Secondary | ICD-10-CM | POA: Diagnosis not present

## 2020-07-03 DIAGNOSIS — I69351 Hemiplegia and hemiparesis following cerebral infarction affecting right dominant side: Secondary | ICD-10-CM | POA: Diagnosis not present

## 2020-07-03 NOTE — Therapy (Signed)
[place], [place], [address] Phone: [phone]   Fax:  [phone]  Physical Therapy Treatment  Patient Details  Name: Brad Delacruz MRN: [id number] Date of Birth: [birth year] Referring Provider (PT): Lauraine Rinne    Encounter Date: 07/03/2020   PT End of Session - 07/03/20 1537    Visit Number 15    Number of Visits 24    Date for PT Re-Evaluation 07/17/20    Authorization Type BCBS no auth, no VL    Progress Note Due on Visit 20    PT Start Time 1527    PT Stop Time 1613    PT Time Calculation (min) 46 min    Equipment Utilized During Treatment Gait belt    Activity Tolerance Patient tolerated treatment well;Patient limited by fatigue    Behavior During Therapy [facility] for tasks assessed/performed           Past Medical History:  Diagnosis Date  . Diabetes mellitus without complication [facility])     Past Surgical History:  Procedure Laterality Date  . IR CT HEAD LTD  03/24/2020  . IR CT HEAD LTD  03/24/2020  . IR INTRA CRAN STENT  03/24/2020  . IR PATIENT EVAL TECH 0-60 MINS  03/25/2020  . IR PERCUTANEOUS ART THROMBECTOMY/INFUSION INTRACRANIAL INC DIAG ANGIO  03/24/2020  . RADIOLOGY WITH ANESTHESIA N/A 03/24/2020   Procedure: IR WITH ANESTHESIA;  Surgeon: Luanne Bras, MD;  Location: [facility];  Service: Radiology;  Laterality: N/A;    There were no vitals filed for this visit.   Subjective Assessment - 07/03/20 1525    Subjective No new reports today.  Daughter reports he has became more mobile wiht WC around the house.  Daughter reports he may begin speech therapy at [facility] soon.    Patient is accompained by: Family member   daughter as interpreter and wife present.   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 No/denies                              [facility] Adult PT Treatment/Exercise - 07/03/20 0001      Bed Mobility   Bed Mobility Supine to Sit;Sit to Supine    Supine to Sit Moderate Assistance - Patient 50-74%   cueing for use of Rt LE to assist with Lt LE bed mobility   Sit to Supine Minimal Assistance - Patient > 75%      Transfers   Sit to Stand 5: Supervision;4: Min guard    Stand to Sit 5: Supervision;4: Min guard    Stand Pivot Transfers 5: Supervision;Other (comment)      Chief Technology Officer Yes    Wheelchair Assistance 5: Supervision    Wheelchair Propulsion Left upper extremity;Left lower extremity    Distance 200 ft    Comments increased speed and ability to complete 2 corners without assistance      Knee/Hip Exercises: Standing   Functional Squat 10 reps    Functional Squat Limitations Therapist blocking Pt Rt knee     Other Standing Knee Exercises wt shifting     Other Standing Knee Exercises standing tolerance x _0  prior fatigue wiht therapist blocking Rt knee      Knee/Hip Exercises: Seated   Sit to Sand 10  reps   Lt UE A     Knee/Hip Exercises: Supine   Bridges 10 reps    Other Supine Knee/Hip Exercises isometric hip ab/adduction x 10 each                     PT Short Term Goals - 06/22/20 1340      PT SHORT TERM GOAL #1   Title Patient will be independent with initial HEP and self-management strategies to improve functional outcomes    Baseline Reports compliance    Time 4    Period Weeks    Status Achieved    Target Date 06/19/20      PT SHORT TERM GOAL #2   Title Patient will be able to perform stand Mod I to demonstrate improvement in functional mobility and reduced risk for falls.    Baseline Current: Min gaurd    Time 4    Period Weeks    Status On-going    Target Date 06/19/20      PT SHORT TERM GOAL #3   Title Patient will be able to maintain standing balance up to 45 sec with use of hemi walker to demo  improved functional mobility.    Baseline can stand 2 minutes with LUE assist using parallel bars    Time 4    Period Weeks    Status Partially Met    Target Date 06/19/20             PT Long Term Goals - 06/22/20 1341      PT LONG TERM GOAL #1   Title Patient will be able to perform stand x 5 in < 60 seconds to demonstrate improvement in functional mobility and reduced risk for falls.    Baseline current 63 seconds with heavy use of LUE    Time 8    Period Weeks    Status On-going      PT LONG TERM GOAL #2   Title Patient will be able to ambulate at least 75 feet during 2MWT with LRAD to demonstrate improved ability to perform functional mobility and associated tasks.    Baseline Current 12 feet in parallell bars with Max A for RLE movement    Time 8    Period Weeks    Status On-going      PT LONG TERM GOAL #3   Title Patient will be able to maintain standing balance up to 2 min with use of hemi walker to demo improved functional mobility.    Baseline can stand 2 minutes with LUE assist using parallel bars    Time 8    Period Weeks    Status Partially Met                 Plan - 07/03/20 1606    Clinical Impression Statement Session focus on wheelchair mobility, transfers and bed mobility.  Pt improveing maneuvering and speed with use of Lt UE/LE, able to complete all turns independently.  Improved AAROM wiht Rt LE assisting Lt with bed mobility, still requires some verbal cueing and increased time to process.  Pt improving strength wiht adduction, continues to be 0 with hip flexion, knee extension and ankle motion.  Limited by fatigue, no reports of pain.    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, DM, aphasia    Examination-Activity Limitations Bathing;Lift;Bed Mobility;Locomotion Level;Bend;Caring for Others;Self Feeding;Transfers;Reach Overhead;Carry;Sit;Dressing;Hygiene/Grooming;Stairs;Squat;Stand;Toileting     Examination-Participation Restrictions Yard Work;Driving;Community  Activity;Cleaning    Stability/Clinical Decision Making Evolving/Moderate complexity    Clinical Decision Making Moderate    Rehab Potential Fair    PT Frequency 3x / week    PT Duration 8 weeks    PT Treatment/Interventions ADLs/Self Care Home Management;Aquatic Therapy;Biofeedback;Cryotherapy;Electrical Stimulation;Contrast Bath;Therapeutic exercise;Therapeutic activities;Fluidtherapy;Parrafin;Patient/family education;Orthotic Fit/Training;Manual lymph drainage;Compression bandaging;Splinting;Taping;Vasopneumatic Device;Joint Manipulations;Spinal Manipulations;Energy conservation;Dry needling;Passive range of motion;Scar mobilization;Prosthetic Training;Wheelchair mobility training;Balance training;Neuromuscular re-education;DME Instruction;Gait training;Iontophoresis 42m/ml Dexamethasone;Moist Heat;Stair training;Traction;Functional mobility training;Ultrasound;Cognitive remediation;Manual techniques    PT Next Visit Plan Progress strength with focus on functional mobility,( most likely this will be wheel chair mobility  and transfers.)           Patient will benefit from skilled therapeutic intervention in order to improve the following deficits and impairments:  Decreased balance, Difficulty walking, Impaired UE functional use, Decreased strength, Decreased range of motion, Decreased activity tolerance, Decreased mobility, Impaired sensation, Improper body mechanics, Decreased cognition, Abnormal gait, Decreased coordination  Visit Diagnosis: Muscle weakness (generalized)  Other abnormalities of gait and mobility     Problem List Patient Active Problem List   Diagnosis Date Noted  . Dysphagia, post-stroke   . Essential hypertension   . Benign essential HTN   . SAH (subarachnoid hemorrhage) ([facility] 04/16/2020  . Cerebral edema ([facility] 04/16/2020  . Pneumonia 04/16/2020  . Hypertensive emergency 04/16/2020  .  Hyperlipidemia 04/16/2020  . Diabetes mellitus type II, uncontrolled ([facility] 04/16/2020  . Dysphagia 04/16/2020  . Protein-calorie malnutrition ([facility] 04/16/2020  . Urinary retention 04/16/2020  . 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 Acute respiratory failure ([place]   . Acute ischemic stroke ([place] L MCA d/t L M1 oclusion s/p MCA stent 03/24/2020  . CVA (cerebral vascular accident) ([facility] 03/24/2020  . Middle cerebral artery embolism, left 03/24/2020   CIhor Austin LPTA/CLT; CBIS [phone] CAldona Lento7/30/2021, 5:15 PM  [place] [address]Phone: [phone]  Fax:  [phone] Name: Brad TuckermanMRN: [id number] of Birth: [birth year]

## 2020-07-06 ENCOUNTER — Other Ambulatory Visit: Payer: Self-pay

## 2020-07-06 ENCOUNTER — Encounter (HOSPITAL_COMMUNITY): Payer: Self-pay | Admitting: Physical Therapy

## 2020-07-06 ENCOUNTER — Ambulatory Visit (INDEPENDENT_AMBULATORY_CARE_PROVIDER_SITE_OTHER): Payer: BC Managed Care – PPO | Admitting: Diagnostic Neuroimaging

## 2020-07-06 ENCOUNTER — Ambulatory Visit (HOSPITAL_COMMUNITY): Payer: BC Managed Care – PPO | Attending: Physician Assistant | Admitting: Physical Therapy

## 2020-07-06 ENCOUNTER — Encounter: Payer: Self-pay | Admitting: Diagnostic Neuroimaging

## 2020-07-06 VITALS — BP 121/78 | HR 67 | Ht 65.0 in

## 2020-07-06 DIAGNOSIS — M25511 Pain in right shoulder: Secondary | ICD-10-CM | POA: Insufficient documentation

## 2020-07-06 DIAGNOSIS — I639 Cerebral infarction, unspecified: Secondary | ICD-10-CM

## 2020-07-06 DIAGNOSIS — I69351 Hemiplegia and hemiparesis following cerebral infarction affecting right dominant side: Secondary | ICD-10-CM | POA: Insufficient documentation

## 2020-07-06 DIAGNOSIS — I69820 Aphasia following other cerebrovascular disease: Secondary | ICD-10-CM | POA: Diagnosis not present

## 2020-07-06 DIAGNOSIS — R2689 Other abnormalities of gait and mobility: Secondary | ICD-10-CM | POA: Diagnosis not present

## 2020-07-06 DIAGNOSIS — M6281 Muscle weakness (generalized): Secondary | ICD-10-CM | POA: Diagnosis not present

## 2020-07-06 NOTE — Therapy (Signed)
[place], [place], [address] Phone: [phone]   Fax:  [phone]  Physical Therapy Treatment  Patient Details  Name: Brad Delacruz MRN: [id number] Date of Birth: [birth year] Referring Provider (PT): Lauraine Rinne    Encounter Date: 07/06/2020   PT End of Session - 07/06/20 1304    Visit Number 16    Number of Visits 24    Date for PT Re-Evaluation 07/17/20    Authorization Type BCBS no auth, no VL    Progress Note Due on Visit 20    PT Start Time 1305    PT Stop Time 1345    PT Time Calculation (min) 40 min    Equipment Utilized During Treatment Gait belt    Activity Tolerance Patient tolerated treatment well;Patient limited by fatigue    Behavior During Therapy [facility] for tasks assessed/performed           Past Medical History:  Diagnosis Date  . Diabetes mellitus without complication ([place])   . Stroke [facility])     Past Surgical History:  Procedure Laterality Date  . IR CT HEAD LTD  03/24/2020  . IR CT HEAD LTD  03/24/2020  . IR INTRA CRAN STENT  03/24/2020  . IR PATIENT EVAL TECH 0-60 MINS  03/25/2020  . IR PERCUTANEOUS ART THROMBECTOMY/INFUSION INTRACRANIAL INC DIAG ANGIO  03/24/2020  . RADIOLOGY WITH ANESTHESIA N/A 03/24/2020   Procedure: IR WITH ANESTHESIA;  Surgeon: Luanne Bras, MD;  Location: [facility];  Service: Radiology;  Laterality: N/A;    There were no vitals filed for this visit.   Subjective Assessment - 07/06/20 1647    Subjective Nothing new reported    Patient is accompained by: Family member   wife present.   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 No/denies                             [facility] Adult PT Treatment/Exercise - 07/06/20 0001      Transfers   Stand Pivot Transfers 5: Supervision;Other (comment)       Chief Technology Officer Yes    Wheelchair Assistance 5: Supervision    Wheelchair Propulsion Left upper extremity;Left lower extremity    Distance 150 ft      Knee/Hip Exercises: Seated   Long Arc Quad Left;2 sets;10 reps    Long Arc Quad Weight 1 lbs.    Marching Left;2 sets;10 reps    Marching Weights 1 lbs.    Abduction/Adduction  Both;1 set;10 reps    Sit to Sand 5 reps;with UE support               Balance Exercises - 07/06/20 0001      Balance Exercises: Standing   Other Standing Exercises Comments standing balance holds unsupported 3 x 20"; standing forward cone reach and stack 5 min CG       Balance Exercises: Seated   Other Seated Exercises Comments seated forward reach for cones 5 min; seated lateral RT side seaching for cones 5' min               PT Short Term Goals - 06/22/20 1340      PT SHORT TERM GOAL #1   Title Patient will be independent with initial HEP and self-management  strategies to improve functional outcomes    Baseline Reports compliance    Time 4    Period Weeks    Status Achieved    Target Date 06/19/20      PT SHORT TERM GOAL #2   Title Patient will be able to perform stand Mod I to demonstrate improvement in functional mobility and reduced risk for falls.    Baseline Current: Min gaurd    Time 4    Period Weeks    Status On-going    Target Date 06/19/20      PT SHORT TERM GOAL #3   Title Patient will be able to maintain standing balance up to 45 sec with use of hemi walker to demo improved functional mobility.    Baseline can stand 2 minutes with LUE assist using parallel bars    Time 4    Period Weeks    Status Partially Met    Target Date 06/19/20             PT Long Term Goals - 06/22/20 1341      PT LONG TERM GOAL #1   Title Patient will be able to perform stand x 5 in < 60 seconds to demonstrate improvement in functional mobility and reduced risk for falls.    Baseline current 63 seconds with  heavy use of LUE    Time 8    Period Weeks    Status On-going      PT LONG TERM GOAL #2   Title Patient will be able to ambulate at least 75 feet during 2MWT with LRAD to demonstrate improved ability to perform functional mobility and associated tasks.    Baseline Current 12 feet in parallell bars with Max A for RLE movement    Time 8    Period Weeks    Status On-going      PT LONG TERM GOAL #3   Title Patient will be able to maintain standing balance up to 2 min with use of hemi walker to demo improved functional mobility.    Baseline can stand 2 minutes with LUE assist using parallel bars    Time 8    Period Weeks    Status Partially Met                 Plan - 07/06/20 1642    Clinical Impression Statement Patient shows improved transfers form chair to bed and bed to chair. Patient tolerated ther ex progressions well today and was able to add weight to seated LE strengthening exercise. Patient showed good control and seated balance with added cone reaches forward and RT lateral. Patient was more challenged with RT lateral reaching. Patient did well with standing forward cone reach and shows improved weight shifting after being cued. Patient able to perform several rounds of this with CG assist, but with increased fatigue noted. Patient showing improved wheelchair mobility as well and was able to navigate turn with minimal cueing today.    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, DM, aphasia    Examination-Activity Limitations Bathing;Lift;Bed Mobility;Locomotion Level;Bend;Caring for Others;Self Feeding;Transfers;Reach Overhead;Carry;Sit;Dressing;Hygiene/Grooming;Stairs;Squat;Stand;Toileting    Examination-Participation Restrictions Yard Work;Driving;Community Activity;Cleaning    Stability/Clinical Decision Making Evolving/Moderate complexity    Rehab Potential Fair    PT Frequency 3x / week    PT Duration 8 weeks    PT  Treatment/Interventions ADLs/Self Care Home Management;Aquatic Therapy;Biofeedback;Cryotherapy;Electrical Stimulation;Contrast Bath;Therapeutic exercise;Therapeutic activities;Fluidtherapy;Parrafin;Patient/family education;Orthotic Fit/Training;Manual lymph drainage;Compression bandaging;Splinting;Taping;Vasopneumatic Device;Joint Manipulations;Spinal  Manipulations;Energy conservation;Dry needling;Passive range of motion;Scar mobilization;Prosthetic Training;Wheelchair mobility training;Balance training;Neuromuscular re-education;DME Instruction;Gait training;Iontophoresis 49m/ml Dexamethasone;Moist Heat;Stair training;Traction;Functional mobility training;Ultrasound;Cognitive remediation;Manual techniques    PT Next Visit Plan Progress strength with focus on functional mobility,( most likely this will be wheel chair mobility  and transfers.)    Consulted and Agree with Plan of Care Patient    Family Member Consulted Wife           Patient will benefit from skilled therapeutic intervention in order to improve the following deficits and impairments:  Decreased balance, Difficulty walking, Impaired UE functional use, Decreased strength, Decreased range of motion, Decreased activity tolerance, Decreased mobility, Impaired sensation, Improper body mechanics, Decreased cognition, Abnormal gait, Decreased coordination  Visit Diagnosis: Muscle weakness (generalized)  Other abnormalities of gait and mobility     Problem List Patient Active Problem List   Diagnosis Date Noted  . Dysphagia, post-stroke   . Essential hypertension   . Benign essential HTN   . SAH (subarachnoid hemorrhage) ([place] 04/16/2020  . Cerebral edema (HL[place] 04/16/2020  . Pneumonia 04/16/2020  . Hypertensive emergency 04/16/2020  . Hyperlipidemia 04/16/2020  . Diabetes mellitus type II, uncontrolled (HFox Chase 04/16/2020  . Dysphagia 04/16/2020  . Protein-calorie malnutrition (HFaj[place] 04/16/2020  . Urinary retention 04/16/2020  .  Left middle cerebral artery stroke (HDevils Lake 04/16/2020  . Acute respiratory failure (HOlar   . Acute ischemic stroke (HK[place] L MCA d/t L M1 oclusion s/p MCA stent 03/24/2020  . CVA (cerebral vascular accident) ([place]Be[place] 03/24/2020  . Middle cerebral artery embolism, left 03/24/2020   4:48 PM, 07/06/20 CJosue HectorPT DPT  [facility] [phone] [address]Phone: [phone]  Fax:  [phone] Name: Brad SermersheimMRN: [id number] of Birth: 127-Aug-1962

## 2020-07-06 NOTE — Progress Notes (Signed)
[facility]  PATIENT: Brad Delacruz DOB: 17-Oct-1961  REFERRING CLINICIAN: Celene Squibb, MD HISTORY FROM: patient, daughter, chart review REASON FOR VISIT: new consult    HISTORICAL  CHIEF COMPLAINT:  Chief Complaint  Patient presents with  . Stroke    rm 6 New Pt dgtrAlinda Delacruz ,interpreter- Jana Half  "doing PT/OT, ST pending"    HISTORY OF PRESENT ILLNESS:   59 year old male here for evaluation of stroke. History of diabetes, hypertension, hyperlipidemia. Patient presented to the hospital on 03/24/2020 for sudden onset right-sided weakness and aphasia. Initial NIH stroke scale was 28. CT perfusion study was done and patient was transferred to [facility] for mechanical thrombectomy. Patient received IV TPA and mechanical thrombectomy with M1 stenting. Patient had prolonged hospital course. He had cerebral edema requiring induced hypernatremia. Also had acute respiratory failure and pneumonia. Care managed by Dr. Leonie Man and stroke team at [facility].   Patient now back at home. He is getting PT and OT at home. Unfortunately insurance is not covering speech therapy. Patient is bilingual [place] and [place] speaking. Prior to his stroke he was using [place] more in [place]. After the stroke he is having some confusion with switching between languages. He is tending to do better with English presently.   REVIEW OF SYSTEMS: Full 14 system review of systems performed and negative with exception of: As per HPI.  ALLERGIES: No Known Allergies  HOME MEDICATIONS: Outpatient Medications Prior to Visit  Medication Sig Dispense Refill  . acetaminophen (TYLENOL) 325 MG tablet Take 2 tablets (650 mg total) by mouth every 6 (six) hours as needed for mild pain or moderate pain.    Marland Kitchen aspirin 81 MG chewable tablet Chew 1 tablet (81 mg total) by mouth daily.    Marland Kitchen atorvastatin (LIPITOR) 80 MG tablet Take 1 tablet (80 mg total) by mouth daily. 30 tablet 0  . blood glucose meter kit  and supplies KIT Dispense based on patient and insurance preference. Use up to four times daily as directed. (FOR ICD-9 250.00, 250.01). 1 each 0  . insulin glargine (LANTUS) 100 UNIT/ML Solostar Pen Inject 34 Units into the skin 2 (two) times daily. 15 mL 11  . losartan (COZAAR) 25 MG tablet Take 0.5 tablets (12.5 mg total) by mouth daily. 30 tablet 1  . methocarbamol (ROBAXIN) 500 MG tablet Take 500 mg by mouth every 8 (eight) hours as needed.    Marland Kitchen PENTIPS 32G X 4 MM MISC See admin instructions.    . tamsulosin (FLOMAX) 0.4 MG CAPS capsule Take 1 capsule (0.4 mg total) by mouth daily after supper. 30 capsule 0  . ticagrelor (BRILINTA) 90 MG TABS tablet Take 1 tablet (90 mg total) by mouth 2 (two) times daily. 60 tablet 1  . traMADol (ULTRAM) 50 MG tablet Take 1 tablet (50 mg total) by mouth every 6 (six) hours as needed for severe pain. 20 tablet 0  . amantadine (SYMMETREL) 50 MG/5ML solution Take 10 mLs (100 mg total) by mouth 2 (two) times daily. (Patient not taking: Reported on 07/06/2020) 140 mL 0  . lidocaine (LIDODERM) 5 % Place 1 patch onto the skin daily. Remove & Discard patch within 12 hours or as directed by MD (Patient not taking: Reported on 07/06/2020) 30 patch 0   No facility-administered medications prior to visit.    PAST MEDICAL HISTORY: Past Medical History:  Diagnosis Date  . Diabetes mellitus without complication ([place])   . Stroke [facility])     PAST SURGICAL  HISTORY: Past Surgical History:  Procedure Laterality Date  . IR CT HEAD LTD  03/24/2020  . IR CT HEAD LTD  03/24/2020  . IR INTRA CRAN STENT  03/24/2020  . IR PATIENT EVAL TECH 0-60 MINS  03/25/2020  . IR PERCUTANEOUS ART THROMBECTOMY/INFUSION INTRACRANIAL INC DIAG ANGIO  03/24/2020  . RADIOLOGY WITH ANESTHESIA N/A 03/24/2020   Procedure: IR WITH ANESTHESIA;  Surgeon: Luanne Bras, MD;  Location: [facility];  Service: Radiology;  Laterality: N/A;    FAMILY HISTORY: Family History  Problem Relation Age of Onset  .  Stroke Mother   . Hypertension Mother   . Heart attack Father   . Diabetes Father     SOCIAL HISTORY: Social History   Socioeconomic History  . Marital status: Married    Spouse name: Not on file  . Number of children: 5  . Years of education: Not on file  . Highest education level: Not on file  Occupational History  . Not on file  Tobacco Use  . Smoking status: Never Smoker  . Smokeless tobacco: Never Used  Substance and Sexual Activity  . Alcohol use: Not Currently  . Drug use: Never  . Sexual activity: Not on file  Other Topics Concern  . Not on file  Social History Narrative   Lives with wife, and 1 daughter   Social Determinants of Health   Financial Resource Strain:   . Difficulty of Paying Living Expenses:   Food Insecurity:   . Worried About Charity fundraiser in the Last Year:   . Arboriculturist in the Last Year:   Transportation Needs:   . Film/video editor (Medical):   Marland Kitchen Lack of Transportation (Non-Medical):   Physical Activity:   . Days of Exercise per Week:   . Minutes of Exercise per Session:   Stress:   . Feeling of Stress :   Social Connections:   . Frequency of Communication with Friends and Family:   . Frequency of Social Gatherings with Friends and Family:   . Attends Religious Services:   . Active Member of Clubs or Organizations:   . Attends Archivist Meetings:   Marland Kitchen Marital Status:   Intimate Partner Violence:   . Fear of Current or Ex-Partner:   . Emotionally Abused:   Marland Kitchen Physically Abused:   . Sexually Abused:      PHYSICAL EXAM  GENERAL EXAM/CONSTITUTIONAL: Vitals:  Vitals:   07/06/20 1042  BP: 121/78  Pulse: 67  Height: 5' 5"  (1.651 m)     Body mass index is 28.84 kg/m. Wt Readings from Last 3 Encounters:  05/13/20 173 lb 4.5 oz (78.6 kg)  04/16/20 166 lb 10.7 oz (75.6 kg)     Patient is in no distress; well developed, nourished and groomed; neck is supple  CARDIOVASCULAR:  Examination of  carotid arteries is normal; no carotid bruits  Regular rate and rhythm, no murmurs  Examination of peripheral vascular system by observation and palpation is normal  EYES:  Ophthalmoscopic exam of optic discs and posterior segments is normal; no papilledema or hemorrhages  No exam data present  MUSCULOSKELETAL:  Gait, strength, tone, movements noted in Neurologic exam below  NEUROLOGIC: MENTAL STATUS:  No flowsheet data found.  awake, alert, oriented to person  SEVERE EXPRESSIVE APHASIA; CAN REPEAT HIS NAME; SAYS "NO"; CANNOT NAME  FOLLOW SOME SIMPLE COMMANDS   CRANIAL NERVE:   2nd, 3rd, 4th, 6th - pupils equal and reactive to light, visual  fields full to confrontation, extraocular muscles intact, no nystagmus  5th - facial sensation symmetric  7th - facial strength --> RIGHT LOWER FACIAL WEAKNESS  8th - hearing intact  9th - palate elevates symmetrically, uvula midline  11th - shoulder shrug symmetric  12th - tongue protrusion midline  MOTOR:   normal bulk and tone, full strength in the LUE, LLE; RUE 0; RLE 0  SENSORY:   LIMITED DUE TO APHASIA  COORDINATION:   finger-nose-finger, fine finger movements normal  REFLEXES:   deep tendon reflexes TRACE and symmetric  GAIT/STATION:   IN WHEEL CHAIR     DIAGNOSTIC DATA (LABS, IMAGING, TESTING) - I reviewed patient records, labs, notes, testing and imaging myself where available.  Lab Results  Component Value Date   WBC 6.7 04/17/2020   HGB 12.8 (L) 04/17/2020   HCT 36.8 (L) 04/17/2020   MCV 89.8 04/17/2020   PLT 196 04/17/2020      Component Value Date/Time   NA 137 04/17/2020 0613   K 3.5 04/17/2020 0613   CL 102 04/17/2020 0613   CO2 26 04/17/2020 0613   GLUCOSE 147 (H) 04/17/2020 0613   BUN 19 04/17/2020 0613   CREATININE 0.87 04/17/2020 0613   CALCIUM 9.4 04/17/2020 0613   PROT 6.6 04/17/2020 0613   ALBUMIN 2.6 (L) 04/17/2020 0613   AST 32 04/17/2020 0613   ALT 50 (H) 04/17/2020  0613   ALKPHOS 96 04/17/2020 0613   BILITOT 0.8 04/17/2020 0613   GFRNONAA >60 04/17/2020 0613   GFRAA >60 04/17/2020 0613   Lab Results  Component Value Date   CHOL 180 03/25/2020   HDL 21 (L) 03/25/2020   LDLCALC UNABLE TO CALCULATE IF TRIGLYCERIDE OVER 400 mg/dL 03/25/2020   LDLDIRECT 55.1 03/25/2020   TRIG 119 03/30/2020   CHOLHDL 8.6 03/25/2020   Lab Results  Component Value Date   HGBA1C 9.2 (H) 03/24/2020   No results found for: VITAMINB12 No results found for: TSH   March 24, 2020 stroke admission Stroke: L MCA large infarct due to left M1 occlusion s/p tPA and IR w/ L M1 stent achieving TICI2c with resultant reocclusion, SAH and L tICA thrombus, infarct embolic secondary to unknown source    Cerebral angio L MCA M1 occlusion w/ TICI2c revascularization x 6 passes. Rescue stenting for reocclusion   MRI Large L MCA infarct evolution w/ edema and mass effect w/ 77m L midline shift. SAH L sylvian fissure, L cerebral cortical sulci and basilar cisterns.   MRA L M1 stent w/o flow c/w reocclusion. L ICA filling defect c/w small thrombus.VBJ w/ infundibulum vs aneurysm.   2D EchoEF 55-60%. No source of embolus  LE doppler neg DVT  TCD bubble study positive for clinically insignificant PFO   LDL 55.1  TG 798->675->269 (on 4/22)   HgbA1c9.2    ASSESSMENT AND PLAN  59y.o. year old male here with:   Dx:  1. Acute ischemic stroke (HCC) L MCA d/t L M1 oclusion s/p MCA stent     PLAN:  CRYPTOGENIC STROKE (embolic; unknown source) - check TEE; if negative, then check implanted loop recorder - continue aspirin 81 mg daily and Brilinta (ticagrelor) 90 mg twice a day for secondary stroke prevention - recommend ongoing stroke risk factor control by Primary Care Physician (hypertension, DM, lipids) - follow up with Dr. SLeonie Manin 6 months  Orders Placed This Encounter  Procedures  . ECHO TEE   Return in about 6 months (around 01/06/2021), or with  Dr. Leonie Man (stroke clinic).    Penni Bombard, MD 05/07/3353, 56:25 AM Certified in Neurology, Neurophysiology and Neuroimaging  [facility] [address] [place], [place] [address] [phone]

## 2020-07-07 ENCOUNTER — Encounter (HOSPITAL_COMMUNITY): Payer: Self-pay | Admitting: Occupational Therapy

## 2020-07-07 ENCOUNTER — Ambulatory Visit (HOSPITAL_COMMUNITY): Payer: BC Managed Care – PPO | Admitting: Occupational Therapy

## 2020-07-07 DIAGNOSIS — R2689 Other abnormalities of gait and mobility: Secondary | ICD-10-CM | POA: Diagnosis not present

## 2020-07-07 DIAGNOSIS — I69351 Hemiplegia and hemiparesis following cerebral infarction affecting right dominant side: Secondary | ICD-10-CM

## 2020-07-07 DIAGNOSIS — M25511 Pain in right shoulder: Secondary | ICD-10-CM | POA: Diagnosis not present

## 2020-07-07 DIAGNOSIS — M6281 Muscle weakness (generalized): Secondary | ICD-10-CM | POA: Diagnosis not present

## 2020-07-07 DIAGNOSIS — I69820 Aphasia following other cerebrovascular disease: Secondary | ICD-10-CM | POA: Diagnosis not present

## 2020-07-07 NOTE — Therapy (Signed)
[facility] [address] [place], [place], [address] Phone: [phone]   Fax:  [phone]  Occupational Therapy Treatment  Patient Details  Name: Brad Delacruz MRN: [id number] Date of Birth: 03/22/1961 Referring Provider (OT): Mariam Dollar, [place]    Encounter Date: 07/07/2020   OT End of Session - 07/07/20 1542    Visit Number 13    Number of Visits 24    Date for OT Re-Evaluation 08/21/20   mini reassess completed 7/29   Authorization Type BCBS PPO    Authorization Time Period no auth no viist limit    OT Start Time 1350    OT Stop Time 1430    OT Time Calculation (min) 40 min    Activity Tolerance Patient tolerated treatment well    Behavior During Therapy [facility] for tasks assessed/performed           Past Medical History:  Diagnosis Date  . Diabetes mellitus without complication (HCC)   . Stroke [facility])     Past Surgical History:  Procedure Laterality Date  . IR CT HEAD LTD  03/24/2020  . IR CT HEAD LTD  03/24/2020  . IR INTRA CRAN STENT  03/24/2020  . IR PATIENT EVAL TECH 0-60 MINS  03/25/2020  . IR PERCUTANEOUS ART THROMBECTOMY/INFUSION INTRACRANIAL INC DIAG ANGIO  03/24/2020  . RADIOLOGY WITH ANESTHESIA N/A 03/24/2020   Procedure: IR WITH ANESTHESIA;  Surgeon: Julieanne Cotton, MD;  Location: [facility] OR;  Service: Radiology;  Laterality: N/A;    There were no vitals filed for this visit.   Subjective Assessment - 07/07/20 1346    Subjective  S: No pain.    Patient is accompanied by: Family member   wife   Currently in Pain? No/denies              [facility] OT Assessment - 07/07/20 1346      Assessment   Medical Diagnosis LT MCA       Precautions   Precautions Fall                    OT Treatments/Exercises (OP) - 07/07/20 1537      Bed Mobility   Bed Mobility Supine to Sit;Sit to Supine    Supine to Sit Moderate Assistance - Patient 50-74%    Sit to Supine Minimal Assistance - Patient > 75%       Transfers   Transfers Sit to Stand;Stand to Sit    Sit to Stand 5: Supervision;4: Min guard    Stand to Sit 5: Supervision;4: Min guard      Neurological Re-education Exercises   Scapular Stabilization 10 reps;Seated   elevation/protraction/retraction. Max facilitation   Shoulder Flexion PROM;10 reps    Shoulder ABduction PROM;10 reps    Shoulder Protraction PROM;10 reps    Shoulder Horizontal ABduction PROM;10 reps    Elbow Flexion PROM;10 reps    Elbow Extension PROM;10 reps    Forearm Supination PROM;10 reps    Forearm Pronation PROM;10 reps    Wrist Flexion PROM;10 reps    Wrist Extension PROM;10 reps    Other Exercises 1 Ball roll: using volleyball positioned to the right side pt pushing ball into scaption with max facilitation and support. Pt then placed volleyball on lap and completed flexion ball roll holding right hand on the ball with left hand.    Weight Bearing Position Seated    Seated with weight on forearm Pt seated on mat table, OT providing support  for elbow and positioning forearm to the side. Pt weightbearing through elbow 2 trials, holding for 30" each. Mod cuing and assist for motor planning       Manual Therapy   Manual Therapy Myofascial release    Manual therapy comments Manual therapy completed prior to exercises.     Myofascial Release Myofascial release and manual stretching completed to right UE (shoulder and upper arm) to decrease fascial restrictions and increase joint mobility in a pain free zone.                      OT Short Term Goals - 06/05/20 1651      OT SHORT TERM GOAL #1   Title Patient will be educated on a HEP for ADL completion and improved RUE function.    Time 6    Period Weeks    Status On-going    Target Date 07/02/20      OT SHORT TERM GOAL #2   Title Paitent will complete dressing, bathing, grooming, and toileting with min pa.    Time 6    Period Weeks    Status On-going      OT SHORT TERM GOAL #3   Title Patient  will propel wheelchair independently and safely household distances.    Time 6    Period Weeks    Status On-going      OT SHORT TERM GOAL #4   Title Patients vision will be assessed for deficits.    Time 6    Period Weeks    Status On-going      OT SHORT TERM GOAL #5   Title Patient will use RUE as a gross assist during ADL completion and transfers.    Time 6    Period Weeks    Status On-going      OT SHORT TERM GOAL #6   Title Patient will weightbear on his RUE with mod  facilitation to maintain positioning while shifting weight on and off of RUE during functional tasks.    Time 6    Period Weeks    Status On-going             OT Long Term Goals - 06/05/20 1651      OT LONG TERM GOAL #1   Title Patient will complete B/IADLS at highest level of independence possible.    Time 12    Period Weeks    Status On-going      OT LONG TERM GOAL #2   Title Patent will complete dressing, bathing, grooming, and toileting independently.    Time 12    Period Weeks    Status On-going      OT LONG TERM GOAL #3   Title Paitent will be educated and independent with compensatory techniques for visual deficits.    Time 12    Period Weeks    Status On-going      OT LONG TERM GOAL #4   Title Patient will use his RUE as an active assist during ADL completion.    Time 12    Period Weeks    Status On-going      OT LONG TERM GOAL #5   Title Paitent will weightbear on his RUE and weightshift during ADLs independently.    Time 12    Period Weeks    Status On-going      OT LONG TERM GOAL #6   Title Patient will improve tone in RUE from [place] I to  III in order to use RUE actively during adl completion.    Time 12    Period Weeks    Status On-going                 Plan - 07/07/20 1542    Clinical Impression Statement A: Completed manual techniques for pain management of RUE at shoulder region. Pt with kinesiotape still secure therefore did not reapply this session. No  interpreter present for session therefore information was limited due to aphasia and wife being non-english speaking. Session focusing on ROM of RUE and weightbearing techniques. Pt requiring mod to max facilitation for attaining weightbearing on elbow, once in position was able to maintain with min difficulty. Pt unable to tolerate er/IR stretching today due to pain. Verbal, visual, and tactile cuing required during session.    Body Structure / Function / Physical Skills ADL;Decreased knowledge of use of DME;Strength;Tone;Pain;GMC;Dexterity;Balance;UE functional use;ROM;IADL;Vision;Coordination;FMC    Plan P: Continue myofasical release, scapular ROM, PROM of RUE, and weight bearing. Continue K-tape education with patient and family. Follow up on donning/doffing of shirt. Attempt forward flexion and weight shift with blue therapy ball.    OT Home Exercise Plan eval: weighbearing, functional adl completion. 7/29 family performing K-taping    Consulted and Agree with Plan of Care Patient;Family member/caregiver    Family Member Consulted wife [name])           Patient will benefit from skilled therapeutic intervention in order to improve the following deficits and impairments:   Body Structure / Function / Physical Skills: ADL, Decreased knowledge of use of DME, Strength, Tone, Pain, GMC, Dexterity, Balance, UE functional use, ROM, IADL, Vision, Coordination, [facility]       Visit Diagnosis: Acute pain of right shoulder  Hemiplegia and hemiparesis following cerebral infarction affecting right dominant side [facility])    Problem List Patient Active Problem List   Diagnosis Date Noted  . Dysphagia, post-stroke   . Essential hypertension   . Benign essential HTN   . SAH (subarachnoid hemorrhage) (HCC) 04/16/2020  . Cerebral edema (HCC) 04/16/2020  . Pneumonia 04/16/2020  . Hypertensive emergency 04/16/2020  . Hyperlipidemia 04/16/2020  . Diabetes mellitus type II, uncontrolled (HCC) 04/16/2020    . Dysphagia 04/16/2020  . Protein-calorie malnutrition (HCC) 04/16/2020  . Urinary retention 04/16/2020  . 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 Acute respiratory failure (HCC)   . Acute ischemic stroke (HCC) L MCA d/t L M1 oclusion s/p MCA stent 03/24/2020  . CVA (cerebral vascular accident) (HCC) 03/24/2020  . Middle cerebral artery embolism, left 03/24/2020   Ezra Sites, OTR/L  [phone] 07/07/2020, 3:46 PM  [facility] [address] [place], [place], [address] Phone: [phone]   Fax:  [phone]  Name: Vannie Hochstetler MRN: [id number] Date of Birth: 09-21-1961

## 2020-07-08 ENCOUNTER — Ambulatory Visit (HOSPITAL_COMMUNITY): Payer: BC Managed Care – PPO | Admitting: Physical Therapy

## 2020-07-08 ENCOUNTER — Encounter (HOSPITAL_COMMUNITY): Payer: Self-pay | Admitting: Physical Therapy

## 2020-07-08 ENCOUNTER — Other Ambulatory Visit: Payer: Self-pay

## 2020-07-08 DIAGNOSIS — M6281 Muscle weakness (generalized): Secondary | ICD-10-CM

## 2020-07-08 DIAGNOSIS — I69351 Hemiplegia and hemiparesis following cerebral infarction affecting right dominant side: Secondary | ICD-10-CM | POA: Diagnosis not present

## 2020-07-08 DIAGNOSIS — I69820 Aphasia following other cerebrovascular disease: Secondary | ICD-10-CM | POA: Diagnosis not present

## 2020-07-08 DIAGNOSIS — R2689 Other abnormalities of gait and mobility: Secondary | ICD-10-CM | POA: Diagnosis not present

## 2020-07-08 DIAGNOSIS — M25511 Pain in right shoulder: Secondary | ICD-10-CM | POA: Diagnosis not present

## 2020-07-08 NOTE — Therapy (Signed)
[place], [place], [address] Phone: [phone]   Fax:  [phone]  Physical Therapy Treatment  Patient Details  Name: Brad Delacruz MRN: [id number] Date of Birth: [birth year] Referring Provider (PT): Lauraine Rinne    Encounter Date: 07/08/2020   PT End of Session - 07/08/20 1304    Visit Number 17    Number of Visits 24    Date for PT Re-Evaluation 07/17/20    Authorization Type BCBS no auth, no VL    Progress Note Due on Visit 20    PT Start Time 1305    PT Stop Time 1345    PT Time Calculation (min) 40 min    Equipment Utilized During Treatment Gait belt    Activity Tolerance Patient tolerated treatment well    Behavior During Therapy [facility] for tasks assessed/performed           Past Medical History:  Diagnosis Date  . Diabetes mellitus without complication ([place])   . Stroke [facility])     Past Surgical History:  Procedure Laterality Date  . IR CT HEAD LTD  03/24/2020  . IR CT HEAD LTD  03/24/2020  . IR INTRA CRAN STENT  03/24/2020  . IR PATIENT EVAL TECH 0-60 MINS  03/25/2020  . IR PERCUTANEOUS ART THROMBECTOMY/INFUSION INTRACRANIAL INC DIAG ANGIO  03/24/2020  . RADIOLOGY WITH ANESTHESIA N/A 03/24/2020   Procedure: IR WITH ANESTHESIA;  Surgeon: Luanne Bras, MD;  Location: [facility];  Service: Radiology;  Laterality: N/A;    There were no vitals filed for this visit.   Subjective Assessment - 07/08/20 1309    Subjective No new issues "all good"    Patient is accompained by: Family member;Interpreter   wife present. [place] translator   Pertinent History LT MCA infarct 03/24/20, DM    Limitations Sitting;Lifting;Standing;Walking;House hold activities    Patient Stated Goals Patient doesnt speak    Currently in Pain? No/denies                             [facility] Adult PT Treatment/Exercise - 07/08/20 0001      Bed Mobility   Rolling Left Minimal Assistance - Patient > 75%     Supine to Sit Minimal Assistance - Patient > 75%    Sit to Supine Minimal Assistance - Patient > 75%      Transfers   Sit to Stand 5: Supervision    Stand to Sit 5: Supervision      Knee/Hip Exercises: Seated   Marching Left;2 sets;10 reps    Sit to Sand 5 reps;with UE support      Knee/Hip Exercises: Supine   Bridges 2 sets;10 reps   RLE fixated    Straight Leg Raises Left;1 set;10 reps               Balance Exercises - 07/08/20 0001      Balance Exercises: Standing   Other Standing Exercises Comments standing balance holds intermittent HHA x 1, 3 x up to 2 min (able to stand up to 30 sec with no assist); standing cone reach to LT 4 cones 3 rounds, cone reach to RT 4 cones 3 rounds                PT Short Term Goals - 06/22/20 1340      PT SHORT TERM GOAL #1   Title Patient will be independent with initial HEP  and self-management strategies to improve functional outcomes    Baseline Reports compliance    Time 4    Period Weeks    Status Achieved    Target Date 06/19/20      PT SHORT TERM GOAL #2   Title Patient will be able to perform stand Mod I to demonstrate improvement in functional mobility and reduced risk for falls.    Baseline Current: Min gaurd    Time 4    Period Weeks    Status On-going    Target Date 06/19/20      PT SHORT TERM GOAL #3   Title Patient will be able to maintain standing balance up to 45 sec with use of hemi walker to demo improved functional mobility.    Baseline can stand 2 minutes with LUE assist using parallel bars    Time 4    Period Weeks    Status Partially Met    Target Date 06/19/20             PT Long Term Goals - 06/22/20 1341      PT LONG TERM GOAL #1   Title Patient will be able to perform stand x 5 in < 60 seconds to demonstrate improvement in functional mobility and reduced risk for falls.    Baseline current 63 seconds with heavy use of LUE    Time 8    Period Weeks    Status On-going      PT LONG  TERM GOAL #2   Title Patient will be able to ambulate at least 75 feet during 2MWT with LRAD to demonstrate improved ability to perform functional mobility and associated tasks.    Baseline Current 12 feet in parallell bars with Max A for RLE movement    Time 8    Period Weeks    Status On-going      PT LONG TERM GOAL #3   Title Patient will be able to maintain standing balance up to 2 min with use of hemi walker to demo improved functional mobility.    Baseline can stand 2 minutes with LUE assist using parallel bars    Time 8    Period Weeks    Status Partially Met                 Plan - 07/08/20 1622    Clinical Impression Statement Patient shows improved return for hand placement and weight shifting with chair transfers. Patient showed improved ability to keep balance unsupported while standing today. Patient was well challenged with added cross body cone reaching while standing with min guard assist. Patient also with improved bed mobility, but requires verbal cues for limb placement and weight shifting. Patient with mild fatigue with activity today, but shows good improvement overall.    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, DM, aphasia    Examination-Activity Limitations Bathing;Lift;Bed Mobility;Locomotion Level;Bend;Caring for Others;Self Feeding;Transfers;Reach Overhead;Carry;Sit;Dressing;Hygiene/Grooming;Stairs;Squat;Stand;Toileting    Examination-Participation Restrictions Yard Work;Driving;Community Activity;Cleaning    Stability/Clinical Decision Making Evolving/Moderate complexity    Rehab Potential Fair    PT Frequency 3x / week    PT Duration 8 weeks    PT Treatment/Interventions ADLs/Self Care Home Management;Aquatic Therapy;Biofeedback;Cryotherapy;Electrical Stimulation;Contrast Bath;Therapeutic exercise;Therapeutic activities;Fluidtherapy;Parrafin;Patient/family education;Orthotic Fit/Training;Manual lymph  drainage;Compression bandaging;Splinting;Taping;Vasopneumatic Device;Joint Manipulations;Spinal Manipulations;Energy conservation;Dry needling;Passive range of motion;Scar mobilization;Prosthetic Training;Wheelchair mobility training;Balance training;Neuromuscular re-education;DME Instruction;Gait training;Iontophoresis 60m/ml Dexamethasone;Moist Heat;Stair training;Traction;Functional mobility training;Ultrasound;Cognitive remediation;Manual techniques    PT Next Visit Plan Progress strength with  focus on functional mobility,( most likely this will be wheel chair mobility  and transfers.)    Consulted and Agree with Plan of Care Patient    Family Member Consulted Wife           Patient will benefit from skilled therapeutic intervention in order to improve the following deficits and impairments:  Decreased balance, Difficulty walking, Impaired UE functional use, Decreased strength, Decreased range of motion, Decreased activity tolerance, Decreased mobility, Impaired sensation, Improper body mechanics, Decreased cognition, Abnormal gait, Decreased coordination  Visit Diagnosis: Muscle weakness (generalized)  Other abnormalities of gait and mobility     Problem List Patient Active Problem List   Diagnosis Date Noted  . Dysphagia, post-stroke   . Essential hypertension   . Benign essential HTN   . SAH (subarachnoid hemorrhage) ([place]) 04/16/2020  . Cerebral edema ([place]) 04/16/2020  . Pneumonia 04/16/2020  . Hypertensive emergency 04/16/2020  . Hyperlipidemia 04/16/2020  . Diabetes mellitus type II, uncontrolled ([place]) 04/16/2020  . Dysphagia 04/16/2020  . Protein-calorie malnutrition ([place]) 04/16/2020  . Urinary retention 04/16/2020  . 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 Acute respiratory failure ([place])   . Acute ischemic stroke ([place]) L MCA d/t L M1 oclusion s/p MCA stent 03/24/2020  . CVA (cerebral vascular accident) ([place]) 03/24/2020  . Middle cerebral artery  embolism, left 03/24/2020   4:34 PM, 07/08/20 Josue Hector PT DPT  Physical Therapist with [facility]  (336) [phone] [place] [address] [place], [place], [address] Phone: [phone]   Fax:  [phone]  Name: Brad Delacruz MRN: [id number] Date of Birth: [birth year]

## 2020-07-09 ENCOUNTER — Encounter (HOSPITAL_COMMUNITY): Payer: Self-pay | Admitting: Occupational Therapy

## 2020-07-09 ENCOUNTER — Ambulatory Visit (HOSPITAL_COMMUNITY): Payer: BC Managed Care – PPO | Admitting: Occupational Therapy

## 2020-07-09 DIAGNOSIS — I69351 Hemiplegia and hemiparesis following cerebral infarction affecting right dominant side: Secondary | ICD-10-CM | POA: Diagnosis not present

## 2020-07-09 DIAGNOSIS — M25511 Pain in right shoulder: Secondary | ICD-10-CM

## 2020-07-09 DIAGNOSIS — R2689 Other abnormalities of gait and mobility: Secondary | ICD-10-CM | POA: Diagnosis not present

## 2020-07-09 DIAGNOSIS — I69820 Aphasia following other cerebrovascular disease: Secondary | ICD-10-CM | POA: Diagnosis not present

## 2020-07-09 DIAGNOSIS — M6281 Muscle weakness (generalized): Secondary | ICD-10-CM | POA: Diagnosis not present

## 2020-07-09 NOTE — Therapy (Signed)
[facility] [address] [place], [place], [address] Phone: [phone]   Fax:  [phone]  Occupational Therapy Treatment  Patient Details  Name: Brad Delacruz MRN: [id number] Date of Birth: Nov 23, 1961 Referring Provider (OT): Mariam Dollar, [place]    Encounter Date: 07/09/2020   OT End of Session - 07/09/20 1359    Visit Number 14    Number of Visits 24    Date for OT Re-Evaluation 08/21/20   mini reassess completed 7/29   Authorization Type BCBS PPO    Authorization Time Period no auth no viist limit    OT Start Time 0815    OT Stop Time 0900    OT Time Calculation (min) 45 min    Activity Tolerance Patient tolerated treatment well    Behavior During Therapy [facility] for tasks assessed/performed           Past Medical History:  Diagnosis Date  . Diabetes mellitus without complication ([facility])   . Stroke [facility])     Past Surgical History:  Procedure Laterality Date  . IR CT HEAD LTD  03/24/2020  . IR CT HEAD LTD  03/24/2020  . IR INTRA CRAN STENT  03/24/2020  . IR PATIENT EVAL TECH 0-60 MINS  03/25/2020  . IR PERCUTANEOUS ART THROMBECTOMY/INFUSION INTRACRANIAL INC DIAG ANGIO  03/24/2020  . RADIOLOGY WITH ANESTHESIA N/A 03/24/2020   Procedure: IR WITH ANESTHESIA;  Surgeon: Julieanne Cotton, MD;  [facility];  Service: Radiology;  Laterality: N/A;    There were no vitals filed for this visit.   Subjective Assessment - 07/09/20 1254    Subjective  S: Has been practicing donning shirt at home    Patient is accompanied by: Family member   wife   Currently in Pain? Yes    Pain Score 4    FACE   Pain Location Shoulder    Pain Orientation Right                        OT Treatments/Exercises (OP) - 07/09/20 1255      Bed Mobility   Bed Mobility Supine to Sit;Sit to Supine    Supine to Sit Minimal Assistance - Patient > 75%    Sit to Supine Minimal Assistance - Patient > 75%      Transfers   Transfers Sit to  Stand;Stand to Sit    Sit to Stand 5: Supervision    Stand to Sit 5: Supervision      ADLs   UB Dressing Doffing shirt with Min cues       Exercises   Exercises --      Neurological Re-education Exercises   Scapular Stabilization 10 reps;Seated;Right    Other Information While in sitting, use of anterior/posterior tilts of pelvis to manipulate trunk and facilitate scapular elevation and depression. Requiring Max A for facilitating movement of scapula    Shoulder Flexion PROM;15 reps    Shoulder ABduction PROM;15 reps    Shoulder Protraction PROM;10 reps    Shoulder Horizontal ABduction PROM;10 reps    Shoulder External Rotation PROM;10 reps   Very limited due to pain   Shoulder Internal Rotation PROM;10 reps    Weight Bearing Position Seated    Seated with weight on forearm Right lateral lean onto forearm and elbow. Support at shoulder and upper arm for technique and positioning. Reach with LUE for obejcts across midline. 5 objects      Manual Therapy  Manual Therapy Myofascial release;Taping    Manual therapy comments Manual therapy completed prior to exercises.     Myofascial Release Myofascial release and manual stretching completed to right UE (shoulder and upper arm) to decrease fascial restrictions and increase joint mobility in a pain free zone.      Kinesiotex Facilitate Muscle   Decrease subluxation                   OT Short Term Goals - 06/05/20 1651      OT SHORT TERM GOAL #1   Title Patient will be educated on a HEP for ADL completion and improved RUE function.    Time 6    Period Weeks    Status On-going    Target Date 07/02/20      OT SHORT TERM GOAL #2   Title Paitent will complete dressing, bathing, grooming, and toileting with min pa.    Time 6    Period Weeks    Status On-going      OT SHORT TERM GOAL #3   Title Patient will propel wheelchair independently and safely household distances.    Time 6    Period Weeks    Status On-going       OT SHORT TERM GOAL #4   Title Patients vision will be assessed for deficits.    Time 6    Period Weeks    Status On-going      OT SHORT TERM GOAL #5   Title Patient will use RUE as a gross assist during ADL completion and transfers.    Time 6    Period Weeks    Status On-going      OT SHORT TERM GOAL #6   Title Patient will weightbear on his RUE with mod  facilitation to maintain positioning while shifting weight on and off of RUE during functional tasks.    Time 6    Period Weeks    Status On-going             OT Long Term Goals - 06/05/20 1651      OT LONG TERM GOAL #1   Title Patient will complete B/IADLS at highest level of independence possible.    Time 12    Period Weeks    Status On-going      OT LONG TERM GOAL #2   Title Patent will complete dressing, bathing, grooming, and toileting independently.    Time 12    Period Weeks    Status On-going      OT LONG TERM GOAL #3   Title Paitent will be educated and independent with compensatory techniques for visual deficits.    Time 12    Period Weeks    Status On-going      OT LONG TERM GOAL #4   Title Patient will use his RUE as an active assist during ADL completion.    Time 12    Period Weeks    Status On-going      OT LONG TERM GOAL #5   Title Paitent will weightbear on his RUE and weightshift during ADLs independently.    Time 12    Period Weeks    Status On-going      OT LONG TERM GOAL #6   Title Patient will improve tone in RUE from [place] I to III in order to use RUE actively during adl completion.    Time 12    Period Weeks    Status On-going  Plan - 07/09/20 1405    Clinical Impression Statement A: Completed manual techniques for pain management of RUE at shoulder region.  Continued PROM of R shoulder and noting increased tolerance and range. Pt also performing bed mobility with Min A for safety with RUE; Min-Mod cues for sequencing. Scapular movement in sitting with  trunk flexion/extension. Pt requiring mod to max facilitation for attaining weightbearing on elbow, once in position was able to maintain with min difficulty. Pt participating in functional reach once in lateral lean position weight bearing through R elbow with Mod A for support. Wife participating in application of new K-tape; Education for correct position and level of pull; Mod cues for applying tape. Verbal, visual, and tactile cuing required during session.    Body Structure / Function / Physical Skills ADL;Decreased knowledge of use of DME;Strength;Tone;Pain;GMC;Dexterity;Balance;UE functional use;ROM;IADL;Vision;Coordination;FMC    Plan P: Continue myofasical release, scapular ROM, PROM of RUE, and weight bearing. Continue K-tape education with patient and family. Follow up on donning/doffing of shirt. Attempt forward flexion and weight shift with blue therapy ball.    OT Home Exercise Plan eval: weighbearing, functional adl completion. 7/29 family performing K-taping    Consulted and Agree with Plan of Care Patient;Family member/caregiver    Family Member Consulted wife [name])           Patient will benefit from skilled therapeutic intervention in order to improve the following deficits and impairments:   Body Structure / Function / Physical Skills: ADL, Decreased knowledge of use of DME, Strength, Tone, Pain, GMC, Dexterity, Balance, UE functional use, ROM, IADL, Vision, Coordination, [facility]       Visit Diagnosis: Hemiplegia and hemiparesis following cerebral infarction affecting right dominant side (HCC)  Acute pain of right shoulder    Problem List Patient Active Problem List   Diagnosis Date Noted  . Dysphagia, post-stroke   . Essential hypertension   . Benign essential HTN   . SAH (subarachnoid hemorrhage) (HCC) 04/16/2020  . Cerebral edema (HCC) 04/16/2020  . Pneumonia 04/16/2020  . Hypertensive emergency 04/16/2020  . Hyperlipidemia 04/16/2020  . Diabetes mellitus  type II, uncontrolled (HCC) 04/16/2020  . Dysphagia 04/16/2020  . Protein-calorie malnutrition (HCC) 04/16/2020  . Urinary retention 04/16/2020  . 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 Acute respiratory failure (HCC)   . Acute ischemic stroke (HCC) L MCA d/t L M1 oclusion s/p MCA stent 03/24/2020  . CVA (cerebral vascular accident) (HCC) 03/24/2020  . Middle cerebral artery embolism, left 03/24/2020    Gabriel Rung, MSOT, OTR/L 07/09/2020, 2:17 PM  [place] [address] [place], [place], [address] Phone: [phone]   Fax:  [phone]  Name: Bereket Gernert MRN: [id number] Date of Birth: 21-Jun-1961

## 2020-07-10 ENCOUNTER — Other Ambulatory Visit: Payer: Self-pay

## 2020-07-10 ENCOUNTER — Ambulatory Visit (HOSPITAL_COMMUNITY): Payer: BC Managed Care – PPO | Admitting: Physical Therapy

## 2020-07-10 DIAGNOSIS — M6281 Muscle weakness (generalized): Secondary | ICD-10-CM

## 2020-07-10 DIAGNOSIS — I69820 Aphasia following other cerebrovascular disease: Secondary | ICD-10-CM

## 2020-07-10 DIAGNOSIS — M25511 Pain in right shoulder: Secondary | ICD-10-CM | POA: Diagnosis not present

## 2020-07-10 DIAGNOSIS — R2689 Other abnormalities of gait and mobility: Secondary | ICD-10-CM | POA: Diagnosis not present

## 2020-07-10 DIAGNOSIS — I69351 Hemiplegia and hemiparesis following cerebral infarction affecting right dominant side: Secondary | ICD-10-CM | POA: Diagnosis not present

## 2020-07-10 NOTE — Therapy (Signed)
[facility] [address] [place], [place], [address] Phone: [phone]   Fax:  [phone]  Physical Therapy Treatment  Patient Details  Name: Brad Delacruz MRN: [id number] Date of Birth: 10/28/1961 Referring Provider (PT): [name], Daniel    Encounter Date: 07/10/2020   PT End of Session - 07/10/20 1402    Visit Number 18    Number of Visits 24    Date for PT Re-Evaluation 07/17/20    Authorization Type BCBS no auth, no VL    Progress Note Due on Visit 20    PT Start Time 1318    PT Stop Time 1356    PT Time Calculation (min) 38 min    Equipment Utilized During Treatment Gait belt    Activity Tolerance Patient limited by pain;Patient limited by fatigue    Behavior During Therapy [facility] for tasks assessed/performed           Past Medical History:  Diagnosis Date  . Diabetes mellitus without complication (HCC)   . Stroke (HCC)     Past Surgical History:  Procedure Laterality Date  . IR CT HEAD LTD  03/24/2020  . IR CT HEAD LTD  03/24/2020  . IR INTRA CRAN STENT  03/24/2020  . IR PATIENT EVAL TECH 0-60 MINS  03/25/2020  . IR PERCUTANEOUS ART THROMBECTOMY/INFUSION INTRACRANIAL INC DIAG ANGIO  03/24/2020  . RADIOLOGY WITH ANESTHESIA N/A 03/24/2020   Procedure: IR WITH ANESTHESIA;  Surgeon: Deveshwar, Sanjeev, MD;  [facility];  Service: Radiology;  Laterality: N/A;    There were no vitals filed for this visit.   Subjective Assessment - 07/10/20 1318    Subjective Pt is non verbal    Patient is accompained by: Family member;Interpreter   wife present. [place] translator   Pertinent History LT MCA infarct 03/24/20, DM    Limitations Sitting;Lifting;Standing;Walking;House hold activities    Patient Stated Goals Patient doesnt speak    Currently in Pain? Yes    Pain Score 6    facial expression holding Rt arm   Pain Location Arm    Pain Orientation Right                             [facility] Adult PT  Treatment/Exercise - 07/10/20 0001      Transfers   Sit to Stand 5: Supervision    Stand to Sit 5: Supervision      Knee/Hip Exercises: Standing   Other Standing Knee Exercises wt shifting both RT/Lt and front to back     Other Standing Knee Exercises attempted to stack cones with Lt UE pt would or could not.       Knee/Hip Exercises: Seated   Long Arc Quad Left;10 reps   Rt PROM    Other Seated Knee/Hip Exercises LT UE overhead to Rt to promote stretching;dip Lt shld down; trunk rotation to promote lumbar motion/strengthening.     Marching Left;10 reps   RT PROM to stretch mm;    Sit to Sand 5 reps                    PT Short Term Goals - 06/22/20 1340      PT SHORT TERM GOAL #1   Title Patient will be independent with initial HEP and self-management strategies to improve functional outcomes    Baseline Reports compliance    Time 4    Period Weeks      Status Achieved    Target Date 06/19/20      PT SHORT TERM GOAL #2   Title Patient will be able to perform stand Mod I to demonstrate improvement in functional mobility and reduced risk for falls.    Baseline Current: Min gaurd    Time 4    Period Weeks    Status On-going    Target Date 06/19/20      PT SHORT TERM GOAL #3   Title Patient will be able to maintain standing balance up to 45 sec with use of hemi walker to demo improved functional mobility.    Baseline can stand 2 minutes with LUE assist using parallel bars    Time 4    Period Weeks    Status Partially Met    Target Date 06/19/20             PT Long Term Goals - 06/22/20 1341      PT LONG TERM GOAL #1   Title Patient will be able to perform stand x 5 in < 60 seconds to demonstrate improvement in functional mobility and reduced risk for falls.    Baseline current 63 seconds with heavy use of LUE    Time 8    Period Weeks    Status On-going      PT LONG TERM GOAL #2   Title Patient will be able to ambulate at least 75 feet during 2MWT with  LRAD to demonstrate improved ability to perform functional mobility and associated tasks.    Baseline Current 12 feet in parallell bars with Max A for RLE movement    Time 8    Period Weeks    Status On-going      PT LONG TERM GOAL #3   Title Patient will be able to maintain standing balance up to 2 min with use of hemi walker to demo improved functional mobility.    Baseline can stand 2 minutes with LUE assist using parallel bars    Time 8    Period Weeks    Status Partially Met                 Plan - 07/10/20 1402    Clinical Impression Statement This is the first time pt has had noted visual pain in his facial expression.  It is difficult as pt has expressive aphasia but it appears that the only thing hurting is his Rt arm.  Pt did not want to go supine due to arm pain and had to be coaxed into completing standing activity which is unlike pt.  Treatment was limited by pain today.l    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, DM, aphasia    Examination-Activity Limitations Bathing;Lift;Bed Mobility;Locomotion Level;Bend;Caring for Others;Self Feeding;Transfers;Reach Overhead;Carry;Sit;Dressing;Hygiene/Grooming;Stairs;Squat;Stand;Toileting    Examination-Participation Restrictions Yard Work;Driving;Community Activity;Cleaning    Stability/Clinical Decision Making Evolving/Moderate complexity    Rehab Potential Fair    PT Frequency 3x / week    PT Duration 8 weeks    PT Treatment/Interventions ADLs/Self Care Home Management;Aquatic Therapy;Biofeedback;Cryotherapy;Electrical Stimulation;Contrast Bath;Therapeutic exercise;Therapeutic activities;Fluidtherapy;Parrafin;Patient/family education;Orthotic Fit/Training;Manual lymph drainage;Compression bandaging;Splinting;Taping;Vasopneumatic Device;Joint Manipulations;Spinal Manipulations;Energy conservation;Dry needling;Passive range of motion;Scar mobilization;Prosthetic Training;Wheelchair  mobility training;Balance training;Neuromuscular re-education;DME Instruction;Gait training;Iontophoresis 57m/ml Dexamethasone;Moist Heat;Stair training;Traction;Functional mobility training;Ultrasound;Cognitive remediation;Manual techniques    PT Next Visit Plan Progress strength with focus on functional mobility,( most likely this will be wheel chair mobility  and transfers.)    Consulted and Agree with Plan of Care Patient  Family Member Consulted Wife           Patient will benefit from skilled therapeutic intervention in order to improve the following deficits and impairments:  Decreased balance, Difficulty walking, Impaired UE functional use, Decreased strength, Decreased range of motion, Decreased activity tolerance, Decreased mobility, Impaired sensation, Improper body mechanics, Decreased cognition, Abnormal gait, Decreased coordination  Visit Diagnosis: Muscle weakness (generalized)  Other abnormalities of gait and mobility  Aphasia following other cerebrovascular disease     Problem List Patient Active Problem List   Diagnosis Date Noted  . Dysphagia, post-stroke   . Essential hypertension   . Benign essential HTN   . SAH (subarachnoid hemorrhage) (HCC) 04/16/2020  . Cerebral edema (HCC) 04/16/2020  . Pneumonia 04/16/2020  . Hypertensive emergency 04/16/2020  . Hyperlipidemia 04/16/2020  . Diabetes mellitus type II, uncontrolled (HCC) 04/16/2020  . Dysphagia 04/16/2020  . Protein-calorie malnutrition (HCC) 04/16/2020  . Urinary retention 04/16/2020  . 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 Acute respiratory failure (HCC)   . Acute ischemic stroke (HCC) L MCA d/t L M1 oclusion s/p MCA stent 03/24/2020  . CVA (cerebral vascular accident) (HCC) 03/24/2020  . Middle cerebral artery embolism, left 03/24/2020    , PT CLT [phone] 07/10/2020, 2:05 PM  [facility] [address] [place], [place], [address] Phone: [phone]   Fax:  [phone]  Name: Braedin Lamica MRN: [id number] Date of Birth: 08/10/1961   

## 2020-07-12 DIAGNOSIS — I63512 Cerebral infarction due to unspecified occlusion or stenosis of left middle cerebral artery: Secondary | ICD-10-CM | POA: Diagnosis not present

## 2020-07-13 DIAGNOSIS — I63512 Cerebral infarction due to unspecified occlusion or stenosis of left middle cerebral artery: Secondary | ICD-10-CM | POA: Diagnosis not present

## 2020-07-14 ENCOUNTER — Ambulatory Visit (HOSPITAL_COMMUNITY): Payer: BC Managed Care – PPO | Admitting: Physical Therapy

## 2020-07-14 ENCOUNTER — Encounter (HOSPITAL_COMMUNITY): Payer: Self-pay | Admitting: Physical Therapy

## 2020-07-14 ENCOUNTER — Encounter (HOSPITAL_COMMUNITY): Payer: Self-pay | Admitting: Occupational Therapy

## 2020-07-14 ENCOUNTER — Ambulatory Visit (HOSPITAL_COMMUNITY): Payer: BC Managed Care – PPO | Admitting: Occupational Therapy

## 2020-07-14 ENCOUNTER — Other Ambulatory Visit: Payer: Self-pay

## 2020-07-14 DIAGNOSIS — I69351 Hemiplegia and hemiparesis following cerebral infarction affecting right dominant side: Secondary | ICD-10-CM | POA: Diagnosis not present

## 2020-07-14 DIAGNOSIS — M6281 Muscle weakness (generalized): Secondary | ICD-10-CM | POA: Diagnosis not present

## 2020-07-14 DIAGNOSIS — R2689 Other abnormalities of gait and mobility: Secondary | ICD-10-CM | POA: Diagnosis not present

## 2020-07-14 DIAGNOSIS — I69820 Aphasia following other cerebrovascular disease: Secondary | ICD-10-CM | POA: Diagnosis not present

## 2020-07-14 DIAGNOSIS — M25511 Pain in right shoulder: Secondary | ICD-10-CM | POA: Diagnosis not present

## 2020-07-14 NOTE — Therapy (Signed)
[place], [place], [address] Phone: [phone]   Fax:  [phone]  Occupational Therapy Treatment  Patient Details  Name: Brad Delacruz MRN: [id number] Date of Birth: 01-30-1961 Referring Provider (OT): Lauraine Rinne, [place]    Encounter Date: 07/14/2020   OT End of Session - 07/14/20 1037    Visit Number 15    Number of Visits 24    Date for OT Re-Evaluation 08/21/20   mini reassess completed 7/29   Authorization Type BCBS PPO    Authorization Time Period no auth no viist limit    OT Start Time 0943    OT Stop Time 1021    OT Time Calculation (min) 38 min    Activity Tolerance Patient tolerated treatment well    Behavior During Therapy [facility] for tasks assessed/performed           Past Medical History:  Diagnosis Date  . Diabetes mellitus without complication ([place])   . Stroke [facility])     Past Surgical History:  Procedure Laterality Date  . IR CT HEAD LTD  03/24/2020  . IR CT HEAD LTD  03/24/2020  . IR INTRA CRAN STENT  03/24/2020  . IR PATIENT EVAL TECH 0-60 MINS  03/25/2020  . IR PERCUTANEOUS ART THROMBECTOMY/INFUSION INTRACRANIAL INC DIAG ANGIO  03/24/2020  . RADIOLOGY WITH ANESTHESIA N/A 03/24/2020   Procedure: IR WITH ANESTHESIA;  Surgeon: Luanne Bras, MD;  Location: [place];  Service: Radiology;  Laterality: N/A;    There were no vitals filed for this visit.   Subjective Assessment - 07/14/20 0942    Subjective  S: No pain.    Patient is accompanied by: Family member   wife   Currently in Pain? No/denies              [facility] OT Assessment - 07/14/20 0942      Assessment   Medical Diagnosis LT MCA       Precautions   Precautions Fall                    OT Treatments/Exercises (OP) - 07/14/20 1031      Bed Mobility   Bed Mobility Supine to Sit;Sit to Supine    Supine to Sit Moderate Assistance - Patient 50-74%    Sit to Supine Moderate Assistance - Patient 50-74%        Neurological Re-education Exercises   Scapular Stabilization 10 reps;Seated;Right   prot/ret; elev/dep   Shoulder Flexion PROM;10 reps    Shoulder ABduction PROM;10 reps    Shoulder Protraction PROM;10 reps    Shoulder Horizontal ABduction PROM;10 reps    Shoulder External Rotation PROM;10 reps   pain limited   Shoulder Internal Rotation PROM;10 reps    Elbow Flexion PROM;10 reps    Elbow Extension PROM;10 reps    Other Exercises 1 Ball roll: using volleyball positioned to the right side pt attempting pushing ball into scaption with max facilitation and support-2 reps then too pain limited to continue. Pt then placed volleyball parallel to his right and completed flexion ball roll holding right hand on the ball with OT stabilizing at elbow. OT tapping to facilitate muscle activation, pt requiring max facilitation for flexion/extension ball roll    Other Exercises 2 Pt seated at mat table with large green ball on floor in front of him, placed RUE on ball with LUE securing, Pt rolling therapy ball into flexion 10X, no complaints of pain or grimacing  noted.     Weight Bearing Position Seated    Seated with weight on forearm Pt seated on mat table, OT providing support for elbow and positioning forearm to the side. Pt weightbearing through elbow 2 trials, holding for 30" each. Mod cuing and assist for motor planning     Other Weight-Bearing Exercises 1 Pt seated with RUE placed on mat table to his right with OT supporting at elbow and hand, pt leaning into RUE with elbow slightly bent, then pushing into elbow and hand to push up into seated position. Pt completing 10X with minimal discomfort      Manual Therapy   Manual Therapy Myofascial release    Manual therapy comments Manual therapy completed prior to exercises.     Myofascial Release Myofascial release and manual stretching completed to right UE (shoulder and upper arm) to decrease fascial restrictions and increase joint mobility in a pain  free zone.                      OT Short Term Goals - 06/05/20 1651      OT SHORT TERM GOAL #1   Title Patient will be educated on a HEP for ADL completion and improved RUE function.    Time 6    Period Weeks    Status On-going    Target Date 07/02/20      OT SHORT TERM GOAL #2   Title Paitent will complete dressing, bathing, grooming, and toileting with min pa.    Time 6    Period Weeks    Status On-going      OT SHORT TERM GOAL #3   Title Patient will propel wheelchair independently and safely household distances.    Time 6    Period Weeks    Status On-going      OT SHORT TERM GOAL #4   Title Patients vision will be assessed for deficits.    Time 6    Period Weeks    Status On-going      OT SHORT TERM GOAL #5   Title Patient will use RUE as a gross assist during ADL completion and transfers.    Time 6    Period Weeks    Status On-going      OT SHORT TERM GOAL #6   Title Patient will weightbear on his RUE with mod  facilitation to maintain positioning while shifting weight on and off of RUE during functional tasks.    Time 6    Period Weeks    Status On-going             OT Long Term Goals - 06/05/20 1651      OT LONG TERM GOAL #1   Title Patient will complete B/IADLS at highest level of independence possible.    Time 12    Period Weeks    Status On-going      OT LONG TERM GOAL #2   Title Patent will complete dressing, bathing, grooming, and toileting independently.    Time 12    Period Weeks    Status On-going      OT LONG TERM GOAL #3   Title Paitent will be educated and independent with compensatory techniques for visual deficits.    Time 12    Period Weeks    Status On-going      OT LONG TERM GOAL #4   Title Patient will use his RUE as an active assist during ADL completion.  Time 12    Period Weeks    Status On-going      OT LONG TERM GOAL #5   Title Paitent will weightbear on his RUE and weightshift during ADLs  independently.    Time 12    Period Weeks    Status On-going      OT LONG TERM GOAL #6   Title Patient will improve tone in RUE from [place] I to III in order to use RUE actively during adl completion.    Time 12    Period Weeks    Status On-going                 Plan - 07/14/20 1037    Clinical Impression Statement A: Pt limited in exercise tolerance due to pain today. Completed manual techniques for pain management first, then continued with passive stretching and weightbearing to RUE. Pt requiring visual cuing for exercises-scapular mobilization especially. Pt requiring mod to max facilitation for weightbearing on elbow, due to difficulty with motor planning. No interpreter available for wife however pt answering simple questions appropriately and pointing to painful area as needed.    Body Structure / Function / Physical Skills ADL;Decreased knowledge of use of DME;Strength;Tone;Pain;GMC;Dexterity;Balance;UE functional use;ROM;IADL;Vision;Coordination;FMC    Plan P: Continue myofasical release, scapular ROM, PROM of RUE, and weight bearing. Continue K-tape education with patient and family. Follow up on donning/doffing of shirt. continue with forward flexion and weight shift with blue therapy ball.    OT Home Exercise Plan eval: weighbearing, functional adl completion. 7/29 family performing K-taping    Consulted and Agree with Plan of Care Patient;Family member/caregiver    Family Member Consulted wife Lonna Duval)           Patient will benefit from skilled therapeutic intervention in order to improve the following deficits and impairments:   Body Structure / Function / Physical Skills: ADL, Decreased knowledge of use of DME, Strength, Tone, Pain, GMC, Dexterity, Balance, UE functional use, ROM, IADL, Vision, Coordination, [facility]       Visit Diagnosis: Hemiplegia and hemiparesis following cerebral infarction affecting right dominant side (HCC)  Acute pain of right  shoulder    Problem List Patient Active Problem List   Diagnosis Date Noted  . Dysphagia, post-stroke   . Essential hypertension   . Benign essential HTN   . SAH (subarachnoid hemorrhage) ([place]) 04/16/2020  . Cerebral edema ([place]) 04/16/2020  . Pneumonia 04/16/2020  . Hypertensive emergency 04/16/2020  . Hyperlipidemia 04/16/2020  . Diabetes mellitus type II, uncontrolled ([place]) 04/16/2020  . Dysphagia 04/16/2020  . Protein-calorie malnutrition ([place]) 04/16/2020  . Urinary retention 04/16/2020  . 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 Acute respiratory failure ([place])   . Acute ischemic stroke ([place]) L MCA d/t L M1 oclusion s/p MCA stent 03/24/2020  . CVA (cerebral vascular accident) ([place]) 03/24/2020  . Middle cerebral artery embolism, left 03/24/2020   Guadelupe Sabin, OTR/L  [phone] 07/14/2020, 10:40 AM  [place], [place], [address] Phone: [phone]   Fax:  [phone]  Name: Abdur Hoglund MRN: [id number] Date of Birth: 08-15-1961

## 2020-07-14 NOTE — Therapy (Signed)
[place], [place], [address] Phone: [phone]   Fax:  [phone]  Physical Therapy Treatment  Patient Details  Name: Brad Delacruz MRN: [id number] Date of Birth: 24-Aug-1961 Referring Provider (PT): Lauraine Rinne    Encounter Date: 07/14/2020   PT End of Session - 07/14/20 0903    Visit Number 19    Number of Visits 24    Date for PT Re-Evaluation 07/17/20    Authorization Type BCBS no auth, no VL    Progress Note Due on Visit 20    PT Start Time 0904    PT Stop Time 0944    PT Time Calculation (min) 40 min    Equipment Utilized During Treatment Gait belt    Activity Tolerance Patient limited by pain;Patient limited by fatigue    Behavior During Therapy [facility] for tasks assessed/performed           Past Medical History:  Diagnosis Date  . Diabetes mellitus without complication ([place])   . Stroke [facility]     Past Surgical History:  Procedure Laterality Date  . IR CT HEAD LTD  03/24/2020  . IR CT HEAD LTD  03/24/2020  . IR INTRA CRAN STENT  03/24/2020  . IR PATIENT EVAL TECH 0-60 MINS  03/25/2020  . IR PERCUTANEOUS ART THROMBECTOMY/INFUSION INTRACRANIAL INC DIAG ANGIO  03/24/2020  . RADIOLOGY WITH ANESTHESIA N/A 03/24/2020   Procedure: IR WITH ANESTHESIA;  Surgeon: Luanne Bras, MD;  Location: [place];  Service: Radiology;  Laterality: N/A;    There were no vitals filed for this visit.   Subjective Assessment - 07/14/20 0902    Subjective Nothing new reported    Patient is accompained by: Family member;Interpreter   wife present. [place] translator   Pertinent History LT MCA infarct 03/24/20, DM    Limitations Sitting;Lifting;Standing;Walking;House hold activities    Patient Stated Goals Patient doesnt speak    Currently in Pain? No/denies                             [facility] Adult PT Treatment/Exercise - 07/14/20 0001      Transfers   Sit to Stand 5: Supervision    Stand to  Sit 5: Supervision      Ambulation/Gait   Ambulation/Gait Yes    Ambulation/Gait Assistance 2: Max assist    Ambulation Distance (Feet) 20 Feet    Assistive device Parallel bars    Gait Comments max assist for RLE and balance      Knee/Hip Exercises: Standing   Other Standing Knee Exercises wt shifting both RT/Lt and front to back     Other Standing Knee Exercises standing cone transfers with table in parallel bars reaching across body and back from one side of table to other for standing tolerance and balance       Knee/Hip Exercises: Seated   Long Arc Quad Left;10 reps;2 sets   R PROM   Other Seated Knee/Hip Exercises scap retractions L 1x10; L UE overhead stretch for posture and mobility    Marching Left;10 reps   R PROM   Sit to Sand 5 reps;with UE support;2 sets                  PT Education - 07/14/20 0903    Education Details Patient educated on HEP, mechanics of exercise, possible D/c next session    Person(s) Educated Patient;Spouse    Methods  Explanation;Demonstration    Comprehension Verbalized understanding;Returned demonstration            PT Short Term Goals - 06/22/20 1340      PT SHORT TERM GOAL #1   Title Patient will be independent with initial HEP and self-management strategies to improve functional outcomes    Baseline Reports compliance    Time 4    Period Weeks    Status Achieved    Target Date 06/19/20      PT SHORT TERM GOAL #2   Title Patient will be able to perform stand Mod I to demonstrate improvement in functional mobility and reduced risk for falls.    Baseline Current: Min gaurd    Time 4    Period Weeks    Status On-going    Target Date 06/19/20      PT SHORT TERM GOAL #3   Title Patient will be able to maintain standing balance up to 45 sec with use of hemi walker to demo improved functional mobility.    Baseline can stand 2 minutes with LUE assist using parallel bars    Time 4    Period Weeks    Status Partially Met     Target Date 06/19/20             PT Long Term Goals - 06/22/20 1341      PT LONG TERM GOAL #1   Title Patient will be able to perform stand x 5 in < 60 seconds to demonstrate improvement in functional mobility and reduced risk for falls.    Baseline current 63 seconds with heavy use of LUE    Time 8    Period Weeks    Status On-going      PT LONG TERM GOAL #2   Title Patient will be able to ambulate at least 75 feet during 2MWT with LRAD to demonstrate improved ability to perform functional mobility and associated tasks.    Baseline Current 12 feet in parallell bars with Max A for RLE movement    Time 8    Period Weeks    Status On-going      PT LONG TERM GOAL #3   Title Patient will be able to maintain standing balance up to 2 min with use of hemi walker to demo improved functional mobility.    Baseline can stand 2 minutes with LUE assist using parallel bars    Time 8    Period Weeks    Status Partially Met                 Plan - 07/14/20 0904    Clinical Impression Statement Patient requires max assist for gait for balance and advancing RLE while ambulating in parallel bars. Patient demonstrates good standing tolerance with cone transfers on table while standing in parallel bars but requires support for balance and safety as well as cueing for sequencing of cone movement and placement. Patient requires frequent verbal cueing for all exercises performed today for mechanics. Patient able to complete increased reps of marches and LAQ on LLE and PROM performed on RLE to maintain mobility in RLE joints. Patient completes increased reps of STS exercise today. Patient fatigued at end of session. Anticipate d/c next session. Patient will continue to benefit from physical therapy in order to reduce impairment and improve function.    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, DM, aphasia    Examination-Activity Limitations  Bathing;Lift;Bed  Mobility;Locomotion Level;Bend;Caring for Others;Self Feeding;Transfers;Reach Overhead;Carry;Sit;Dressing;Hygiene/Grooming;Stairs;Squat;Stand;Toileting    Examination-Participation Restrictions Yard Work;Driving;Community Activity;Cleaning    Stability/Clinical Decision Making Evolving/Moderate complexity    Rehab Potential Fair    PT Frequency 3x / week    PT Duration 8 weeks    PT Treatment/Interventions ADLs/Self Care Home Management;Aquatic Therapy;Biofeedback;Cryotherapy;Electrical Stimulation;Contrast Bath;Therapeutic exercise;Therapeutic activities;Fluidtherapy;Parrafin;Patient/family education;Orthotic Fit/Training;Manual lymph drainage;Compression bandaging;Splinting;Taping;Vasopneumatic Device;Joint Manipulations;Spinal Manipulations;Energy conservation;Dry needling;Passive range of motion;Scar mobilization;Prosthetic Training;Wheelchair mobility training;Balance training;Neuromuscular re-education;DME Instruction;Gait training;Iontophoresis 22m/ml Dexamethasone;Moist Heat;Stair training;Traction;Functional mobility training;Ultrasound;Cognitive remediation;Manual techniques    PT Next Visit Plan Progress strength with focus on functional mobility,( most likely this will be wheel chair mobility  and transfers.) anticipate D/c next session    Consulted and Agree with Plan of Care Patient    Family Member Consulted Wife           Patient will benefit from skilled therapeutic intervention in order to improve the following deficits and impairments:  Decreased balance, Difficulty walking, Impaired UE functional use, Decreased strength, Decreased range of motion, Decreased activity tolerance, Decreased mobility, Impaired sensation, Improper body mechanics, Decreased cognition, Abnormal gait, Decreased coordination  Visit Diagnosis: Muscle weakness (generalized)  Other abnormalities of gait and mobility     Problem List Patient Active Problem List   Diagnosis Date Noted   . Dysphagia, post-stroke   . Essential hypertension   . Benign essential HTN   . SAH (subarachnoid hemorrhage) ([place] 04/16/2020  . Cerebral edema ([place] 04/16/2020  . Pneumonia 04/16/2020  . Hypertensive emergency 04/16/2020  . Hyperlipidemia 04/16/2020  . Diabetes mellitus type II, uncontrolled ([place] 04/16/2020  . Dysphagia 04/16/2020  . Protein-calorie malnutrition ([place] 04/16/2020  . Urinary retention 04/16/2020  . Left middle cerebral artery stroke ([place] 04/16/2020  . Acute respiratory failure ([place]   . Acute ischemic stroke ([place] L MCA d/t L M1 oclusion s/p MCA stent 03/24/2020  . CVA (cerebral vascular accident) ([place] 03/24/2020  . Middle cerebral artery embolism, left 03/24/2020    10:18 AM, 07/14/20 AMearl LatinPT, DPT [place] [address]Phone: [phone]  Fax:  [phone] Name: Brad MoncusMRN: [id number] of Birth: 1Jul 19, 1962

## 2020-07-16 ENCOUNTER — Encounter (HOSPITAL_COMMUNITY): Payer: Self-pay | Admitting: Occupational Therapy

## 2020-07-16 ENCOUNTER — Ambulatory Visit (HOSPITAL_COMMUNITY): Payer: BC Managed Care – PPO | Admitting: Occupational Therapy

## 2020-07-16 ENCOUNTER — Other Ambulatory Visit: Payer: Self-pay

## 2020-07-16 ENCOUNTER — Encounter (HOSPITAL_COMMUNITY): Payer: Self-pay | Admitting: Physical Therapy

## 2020-07-16 ENCOUNTER — Ambulatory Visit (HOSPITAL_COMMUNITY): Payer: BC Managed Care – PPO | Admitting: Physical Therapy

## 2020-07-16 DIAGNOSIS — R2689 Other abnormalities of gait and mobility: Secondary | ICD-10-CM | POA: Diagnosis not present

## 2020-07-16 DIAGNOSIS — M6281 Muscle weakness (generalized): Secondary | ICD-10-CM | POA: Diagnosis not present

## 2020-07-16 DIAGNOSIS — I69820 Aphasia following other cerebrovascular disease: Secondary | ICD-10-CM | POA: Diagnosis not present

## 2020-07-16 DIAGNOSIS — M25511 Pain in right shoulder: Secondary | ICD-10-CM | POA: Diagnosis not present

## 2020-07-16 DIAGNOSIS — I69351 Hemiplegia and hemiparesis following cerebral infarction affecting right dominant side: Secondary | ICD-10-CM

## 2020-07-16 NOTE — Therapy (Signed)
[place] [address] [place], [place], [address] Phone: [phone]   Fax:  [phone]  Physical Therapy Treatment/ Progress note/ Discharge summary  Patient Details  Name: Brad Delacruz MRN: [id number] Date of Birth: Oct 26, 1961 Referring Provider (PT): Lauraine Rinne    Encounter Date: 07/16/2020  Progress Note   Reporting Period 06/21/20 to 07/16/20   See note below for Objective Data and Assessment of Progress/Goals   PHYSICAL THERAPY DISCHARGE SUMMARY  Visits from Start of Care: 20  Current functional level related to goals / functional outcomes: See below   Remaining deficits: See below   Education / Equipment: See below  Plan: Patient agrees to discharge.  Patient goals were partially met. Patient is being discharged due to lack of progress.  ?????        PT End of Session - 07/16/20 0855    Visit Number 20    Number of Visits 24    Date for PT Re-Evaluation 07/17/20    Authorization Type BCBS no auth, no VL    Progress Note Due on Visit 20    PT Start Time 0858    PT Stop Time 0937    PT Time Calculation (min) 39 min    Equipment Utilized During Treatment Gait belt    Activity Tolerance Patient limited by pain;Patient limited by fatigue    Behavior During Therapy [facility] for tasks assessed/performed           Past Medical History:  Diagnosis Date  . Diabetes mellitus without complication ([place])   . Stroke [facility])     Past Surgical History:  Procedure Laterality Date  . IR CT HEAD LTD  03/24/2020  . IR CT HEAD LTD  03/24/2020  . IR INTRA CRAN STENT  03/24/2020  . IR PATIENT EVAL TECH 0-60 MINS  03/25/2020  . IR PERCUTANEOUS ART THROMBECTOMY/INFUSION INTRACRANIAL INC DIAG ANGIO  03/24/2020  . RADIOLOGY WITH ANESTHESIA N/A 03/24/2020   Procedure: IR WITH ANESTHESIA;  Surgeon: Luanne Bras, MD;  Location: [place];  Service: Radiology;  Laterality: N/A;    There were no vitals filed for this  visit.   Subjective Assessment - 07/16/20 0854    Subjective No questions today. Nothing new, no falls.    Patient is accompained by: Family member;Interpreter   wife present. Nicole [facility] translator   Pertinent History LT MCA infarct 03/24/20, DM    Limitations Sitting;Lifting;Standing;Walking;House hold activities    Patient Stated Goals Patient doesnt speak              [facility] PT Assessment - 07/16/20 0001      Assessment   Medical Diagnosis LT MCA     Referring Provider (PT) Lauraine Rinne     Onset Date/Surgical Date 03/24/20    Hand Dominance Right    Prior Therapy Inpatient rehab until 05/13/20      Precautions   Precautions Fall      Restrictions   Weight Bearing Restrictions No      Prior Function   Level of Independence Independent    Vocation Full time employment    Vocation Requirements worked at [employer]     Leisure playing with family dog, [place]      Observation/Other Assessments   Observations Seated in [place] chair      Transfers   Five time sit to stand comments  74 seconds, slow, labored, heavy UE use throughout    Comments able to sit to stand without assist, stand  for 1 min with hemi walker before fatigue      Ambulation/Gait   Ambulation/Gait Yes    Ambulation/Gait Assistance 2: Max assist    Ambulation Distance (Feet) 15 Feet    Assistive device Parallel bars    Gait Comments max assist for RLE and balance                         [facility] Adult PT Treatment/Exercise - 07/16/20 0001      Knee/Hip Exercises: Seated   Long Arc Quad Left;10 reps;2 sets   R PROM   Other Seated Knee/Hip Exercises hamstring isometrics 10x 5 second holds    Marching Left;10 reps   R PROM   Sit to Sand 5 reps;with UE support;2 sets                  PT Education - 07/16/20 0854    Education Details Patient educated on reassessment findings, return to PT if necessary    Person(s) Educated Patient;Spouse    Methods Explanation;Demonstration     Comprehension Verbalized understanding;Returned demonstration            PT Short Term Goals - 07/16/20 0907      PT SHORT TERM GOAL #1   Title Patient will be independent with initial HEP and self-management strategies to improve functional outcomes    Baseline Reports compliance    Time 4    Period Weeks    Status Achieved    Target Date 06/19/20      PT SHORT TERM GOAL #2   Title Patient will be able to perform stand Mod I to demonstrate improvement in functional mobility and reduced risk for falls.    Time 4    Period Weeks    Status Achieved    Target Date 06/19/20      PT SHORT TERM GOAL #3   Title Patient will be able to maintain standing balance up to 45 sec with use of hemi walker to demo improved functional mobility.    Baseline can stand 1 minute with hemi walker before fatigue    Time 4    Period Weeks    Status Achieved    Target Date 06/19/20             PT Long Term Goals - 07/16/20 0908      PT LONG TERM GOAL #1   Title Patient will be able to perform stand x 5 in < 60 seconds to demonstrate improvement in functional mobility and reduced risk for falls.    Baseline current 74 seconds with heavy use of LUE    Time 8    Period Weeks    Status Not Met      PT LONG TERM GOAL #2   Title Patient will be able to ambulate at least 75 feet during 2MWT with LRAD to demonstrate improved ability to perform functional mobility and associated tasks.    Baseline Current 15 feet in parallell bars with Max A for RLE movement    Time 8    Period Weeks    Status Not Met      PT LONG TERM GOAL #3   Title Patient will be able to maintain standing balance up to 2 min with use of hemi walker to demo improved functional mobility.    Baseline can stand 2 minutes with LUE assist using parallel bars    Time 8    Period Weeks  Status Partially Met                 Plan - 07/16/20 0855    Clinical Impression Statement Patient has met 3/3 short term goals with  ability to be independent with HEP, transfer to standing without assist, and demonstrate good standing balance with hemi walker. Patient has met 0/3 long term goals with one of those partially being met due to increased standing time with hemi walker. Remaining goals not met due to difficulty with ambulating and decreased functional strength. Patient continues to be remain limited with functional mobility. Patient continues to require max assist with ambulation for balance, strength and advancing RLE. Patient continues to require frequent verbal cueing and demonstration for all mobility and exercises. Patient appears to have plateaued in function. Patient discharged from therapy at this time.    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, DM, aphasia    Examination-Activity Limitations Bathing;Lift;Bed Mobility;Locomotion Level;Bend;Caring for Others;Self Feeding;Transfers;Reach Overhead;Carry;Sit;Dressing;Hygiene/Grooming;Stairs;Squat;Stand;Toileting    Examination-Participation Restrictions Yard Work;Driving;Community Activity;Cleaning    Stability/Clinical Decision Making Evolving/Moderate complexity    Rehab Potential Fair    PT Frequency --    PT Duration --    PT Treatment/Interventions ADLs/Self Care Home Management;Aquatic Therapy;Biofeedback;Cryotherapy;Electrical Stimulation;Contrast Bath;Therapeutic exercise;Therapeutic activities;Fluidtherapy;Parrafin;Patient/family education;Orthotic Fit/Training;Manual lymph drainage;Compression bandaging;Splinting;Taping;Vasopneumatic Device;Joint Manipulations;Spinal Manipulations;Energy conservation;Dry needling;Passive range of motion;Scar mobilization;Prosthetic Training;Wheelchair mobility training;Balance training;Neuromuscular re-education;DME Instruction;Gait training;Iontophoresis 47m/ml Dexamethasone;Moist Heat;Stair training;Traction;Functional mobility training;Ultrasound;Cognitive remediation;Manual  techniques    PT Next Visit Plan n/a    Consulted and Agree with Plan of Care Patient    Family Member Consulted Wife           Patient will benefit from skilled therapeutic intervention in order to improve the following deficits and impairments:  Decreased balance, Difficulty walking, Impaired UE functional use, Decreased strength, Decreased range of motion, Decreased activity tolerance, Decreased mobility, Impaired sensation, Improper body mechanics, Decreased cognition, Abnormal gait, Decreased coordination  Visit Diagnosis: Muscle weakness (generalized)  Other abnormalities of gait and mobility     Problem List Patient Active Problem List   Diagnosis Date Noted  . Dysphagia, post-stroke   . Essential hypertension   . Benign essential HTN   . SAH ([place]noid hemorrhage) ([place] 04/16/2020  . Cerebral edema ([facility] 04/16/2020  . Pneumonia 04/16/2020  . Hypertensive emergency 04/16/2020  . Hyperlipidemia 04/16/2020  . Diabetes mellitus type II, uncontrolled ([place] 04/16/2020  . Dysphagia 04/16/2020  . Protein-calorie malnutrition ([place] 04/16/2020  . Urinary retention 04/16/2020  . 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 Acute respiratory failure ([place]   . Acute ischemic stroke ([place] L MCA d/t L M1 oclusion s/p MCA stent 03/24/2020  . CVA (cerebral vascular accident) ([place] 03/24/2020  . Middle cerebral artery embolism, left 03/24/2020    9:43 AM, 07/16/20 AMearl LatinPT, DPT Physical Therap[place] [address]Phone: [phone]  Fax:  [phone] Name: LSylvio WeatherallMRN: [id number] of Birth: 1August 30, 1962

## 2020-07-16 NOTE — Therapy (Signed)
[place], [place], [address] Phone: [phone]   Fax:  [phone]  Occupational Therapy Treatment  Patient Details  Name: Brad Delacruz MRN: [id number] Date of Birth: 07-11-1961 Referring Provider (OT): Lauraine Rinne, [place]    Encounter Date: 07/16/2020   OT End of Session - 07/16/20 1408    Visit Number 16    Number of Visits 24    Date for OT Re-Evaluation 08/21/20    Authorization Type BCBS PPO    Authorization Time Period no auth no viist limit    OT Start Time 0940    OT Stop Time 1019    OT Time Calculation (min) 39 min    Activity Tolerance Patient tolerated treatment well    Behavior During Therapy [facility] for tasks assessed/performed           Past Medical History:  Diagnosis Date  . Diabetes mellitus without complication ([place])   . Stroke [facility])     Past Surgical History:  Procedure Laterality Date  . IR CT HEAD LTD  03/24/2020  . IR CT HEAD LTD  03/24/2020  . IR INTRA CRAN STENT  03/24/2020  . IR PATIENT EVAL TECH 0-60 MINS  03/25/2020  . IR PERCUTANEOUS ART THROMBECTOMY/INFUSION INTRACRANIAL INC DIAG ANGIO  03/24/2020  . RADIOLOGY WITH ANESTHESIA N/A 03/24/2020   Procedure: IR WITH ANESTHESIA;  Surgeon: Luanne Bras, MD;  Location: [place];  Service: Radiology;  Laterality: N/A;    There were no vitals filed for this visit.   Subjective Assessment - 07/16/20 1402    Subjective  S: No pain.    Patient is accompanied by: Family member;Interpreter    Currently in Pain? No/denies              [facility] OT Assessment - 07/16/20 1402      Assessment   Medical Diagnosis LT MCA       Precautions   Precautions Fall      Restrictions   Weight Bearing Restrictions No                    OT Treatments/Exercises (OP) - 07/16/20 1403      ADLs   UB Dressing Pt doffing shirt for OT to remove kinesiotape. Pt grasping shirt at back of neck and pulling over head independently. To  donn shirt, pt initially attempting to thread RUE into shirt through neck and arm hole, OT provided time for problem solving, then verbal cuing for turning shirt around, pt attempted but was still unable to find correct orientation of shirt. OT then provided assist to turn shirt around, pt then using LUE to thread RUE through shirt and sleeve, donned via pulling over head.       Neurological Re-education Exercises   Scapular Stabilization Right;10 reps;Seated   prot/ret/elev/dep   Shoulder Protraction Self ROM;10 reps    Elbow Flexion Self ROM;10 reps    Elbow Extension Self ROM;10 reps    Other Exercises 2 Pt seated at mat table with large blue ball on floor in front of him, placed RUE on ball with LUE securing, Pt rolling therapy ball into flexion 10X, no complaints of pain or grimacing noted.     Weight Bearing Position Seated    Seated with weight on forearm Pt seated on mat table, OT providing support for elbow and positioning forearm to the side. Pt weightbearing through elbow 2 trials, holding for 30" each. Mod cuing and assist for  motor planning     Other Weight-Bearing Exercises 1 Pt seated with RUE placed on mat table to his right with OT supporting at elbow and hand, pt leaning into RUE with elbow slightly bent, then pushing into elbow and hand to push up into seated position. Pt completing 10X with minimal discomfort                    OT Short Term Goals - 06/05/20 1651      OT SHORT TERM GOAL #1   Title Patient will be educated on a HEP for ADL completion and improved RUE function.    Time 6    Period Weeks    Status On-going    Target Date 07/02/20      OT SHORT TERM GOAL #2   Title Paitent will complete dressing, bathing, grooming, and toileting with min pa.    Time 6    Period Weeks    Status On-going      OT SHORT TERM GOAL #3   Title Patient will propel wheelchair independently and safely household distances.    Time 6    Period Weeks    Status On-going        OT SHORT TERM GOAL #4   Title Patients vision will be assessed for deficits.    Time 6    Period Weeks    Status On-going      OT SHORT TERM GOAL #5   Title Patient will use RUE as a gross assist during ADL completion and transfers.    Time 6    Period Weeks    Status On-going      OT SHORT TERM GOAL #6   Title Patient will weightbear on his RUE with mod  facilitation to maintain positioning while shifting weight on and off of RUE during functional tasks.    Time 6    Period Weeks    Status On-going             OT Long Term Goals - 06/05/20 1651      OT LONG TERM GOAL #1   Title Patient will complete B/IADLS at highest level of independence possible.    Time 12    Period Weeks    Status On-going      OT LONG TERM GOAL #2   Title Patent will complete dressing, bathing, grooming, and toileting independently.    Time 12    Period Weeks    Status On-going      OT LONG TERM GOAL #3   Title Paitent will be educated and independent with compensatory techniques for visual deficits.    Time 12    Period Weeks    Status On-going      OT LONG TERM GOAL #4   Title Patient will use his RUE as an active assist during ADL completion.    Time 12    Period Weeks    Status On-going      OT LONG TERM GOAL #5   Title Paitent will weightbear on his RUE and weightshift during ADLs independently.    Time 12    Period Weeks    Status On-going      OT LONG TERM GOAL #6   Title Patient will improve tone in RUE from [place] I to III in order to use RUE actively during adl completion.    Time 12    Period Weeks    Status On-going  Plan - 07/16/20 1408    Clinical Impression Statement A: Beginning of session focusing on UB undressing/dressing techinques for OT to remove kinesiotape and allow skin to breathe until next session. Pt continued to participate in weightbearing tasks today, no muscle activation noted in during or after any tasks today.  Educated pt and wife on self-ROM exercises, also discussed severity of CVA deficits and focus going forward being on pain management and education for self-ROM of RUE. Still waiting for Giv-Mohr sling to arrive.    Body Structure / Function / Physical Skills ADL;Decreased knowledge of use of DME;Strength;Tone;Pain;GMC;Dexterity;Balance;UE functional use;ROM;IADL;Vision;Coordination;FMC    Plan P: Continue with self-ROM and update HEP. Continue K-tape education with patient and family. continue with forward flexion and weight shift with blue therapy ball. Provide sling when it arrives.    OT Home Exercise Plan eval: weighbearing, functional adl completion. 7/29 family performing K-taping    Consulted and Agree with Plan of Care Patient    Family Member Consulted wife Lonna Duval)           Patient will benefit from skilled therapeutic intervention in order to improve the following deficits and impairments:   Body Structure / Function / Physical Skills: ADL, Decreased knowledge of use of DME, Strength, Tone, Pain, GMC, Dexterity, Balance, UE functional use, ROM, IADL, Vision, Coordination, [facility]       Visit Diagnosis: Hemiplegia and hemiparesis following cerebral infarction affecting right dominant side (HCC)  Acute pain of right shoulder    Problem List Patient Active Problem List   Diagnosis Date Noted  . Dysphagia, post-stroke   . Essential hypertension   . Benign essential HTN   . SAH (subarachnoid hemorrhage) ([place]) 04/16/2020  . Cerebral edema ([place]) 04/16/2020  . Pneumonia 04/16/2020  . Hypertensive emergency 04/16/2020  . Hyperlipidemia 04/16/2020  . Diabetes mellitus type II, uncontrolled ([place]) 04/16/2020  . Dysphagia 04/16/2020  . Protein-calorie malnutrition ([place]) 04/16/2020  . Urinary retention 04/16/2020  . 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 Acute respiratory failure ([place])   . Acute ischemic stroke ([place]) L MCA d/t L M1 oclusion s/p MCA stent 03/24/2020    . CVA (cerebral vascular accident) ([place]) 03/24/2020  . Middle cerebral artery embolism, left 03/24/2020   Guadelupe Sabin, OTR/L  [phone] 07/16/2020, 2:12 PM  [place], [place], [address] Phone: [phone]   Fax:  [phone]  Name: Brad Delacruz MRN: [id number] Date of Birth: 09-13-1961

## 2020-07-21 ENCOUNTER — Other Ambulatory Visit: Payer: Self-pay

## 2020-07-21 NOTE — Patient Outreach (Signed)
Care Coordination  07/21/2020  [place] Mar 17, 1961 [id number]  First telephone outreach attempt to obtain mRs.CMA had interpreter on line for outreach. No answer from patient after multiple rings.   [facility]uch [facility] Management Assistant [phone]

## 2020-07-22 ENCOUNTER — Encounter (HOSPITAL_COMMUNITY): Payer: Self-pay | Admitting: Occupational Therapy

## 2020-07-22 ENCOUNTER — Ambulatory Visit (HOSPITAL_COMMUNITY): Payer: BC Managed Care – PPO | Admitting: Occupational Therapy

## 2020-07-22 ENCOUNTER — Other Ambulatory Visit: Payer: Self-pay

## 2020-07-22 ENCOUNTER — Ambulatory Visit (HOSPITAL_COMMUNITY): Payer: BC Managed Care – PPO | Admitting: Physical Therapy

## 2020-07-22 DIAGNOSIS — M25511 Pain in right shoulder: Secondary | ICD-10-CM

## 2020-07-22 DIAGNOSIS — I69351 Hemiplegia and hemiparesis following cerebral infarction affecting right dominant side: Secondary | ICD-10-CM

## 2020-07-22 DIAGNOSIS — R2689 Other abnormalities of gait and mobility: Secondary | ICD-10-CM | POA: Diagnosis not present

## 2020-07-22 DIAGNOSIS — M6281 Muscle weakness (generalized): Secondary | ICD-10-CM | POA: Diagnosis not present

## 2020-07-22 DIAGNOSIS — I69820 Aphasia following other cerebrovascular disease: Secondary | ICD-10-CM | POA: Diagnosis not present

## 2020-07-22 NOTE — Therapy (Signed)
[facility] [address] [place], [place], [address] Phone: [phone]   Fax:  [phone]  Occupational Therapy Treatment  Patient Details  Name: Brad Delacruz MRN: [id number] Date of Birth: 03-21-1961 Referring Provider (OT): Mariam Dollar, [place]    Encounter Date: 07/22/2020   OT End of Session - 07/22/20 1155    Visit Number 17    Number of Visits 24    Date for OT Re-Evaluation 08/21/20    Authorization Type BCBS PPO    Authorization Time Period no auth no viist limit    OT Start Time 0945    OT Stop Time 1028    OT Time Calculation (min) 43 min    Activity Tolerance Patient tolerated treatment well    Behavior During Therapy [facility] for tasks assessed/performed           Past Medical History:  Diagnosis Date   Diabetes mellitus without complication (HCC)    Stroke [facility])     Past Surgical History:  Procedure Laterality Date   IR CT HEAD LTD  03/24/2020   IR CT HEAD LTD  03/24/2020   IR INTRA CRAN STENT  03/24/2020   IR PATIENT EVAL TECH 0-60 MINS  03/25/2020   IR PERCUTANEOUS ART THROMBECTOMY/INFUSION INTRACRANIAL INC DIAG ANGIO  03/24/2020   RADIOLOGY WITH ANESTHESIA N/A 03/24/2020   Procedure: IR WITH ANESTHESIA;  Surgeon: Julieanne Cotton, MD;  Location: [facility] OR;  Service: Radiology;  Laterality: N/A;    There were no vitals filed for this visit.   Subjective Assessment - 07/22/20 0945    Subjective  S: No pain.    Patient is accompanied by: Family member   wife and daughter   Currently in Pain? No/denies              [facility] OT Assessment - 07/22/20 0945      Assessment   Medical Diagnosis LT MCA       Precautions   Precautions Fall                    OT Treatments/Exercises (OP) - 07/22/20 1055      Neurological Re-education Exercises   Shoulder Flexion PROM;5 reps;Self ROM;10 reps    Shoulder ABduction PROM;5 reps    Shoulder Protraction PROM;5 reps    Shoulder Horizontal  ABduction PROM;5 reps;Self ROM;10 reps    Shoulder External Rotation PROM;5 reps;Self ROM;10 reps    Shoulder Internal Rotation PROM;5 reps;Self ROM;10 reps    Elbow Flexion Self ROM;10 reps    Elbow Extension Self ROM;10 reps    Forearm Supination PROM;5 reps;Self ROM;10 reps    Forearm Pronation PROM;5 reps;Self ROM;10 reps    Wrist Flexion PROM;5 reps;Self ROM;10 reps    Wrist Extension PROM;5 reps;Self ROM;10 reps    Wrist Radial Deviation PROM;5 reps;Self ROM;10 reps    Wrist Ulnar Deviation PROM;5 reps;Self ROM;10 reps    Finger Flexion self P/ROM 5X each digit    Finger Extension self P/ROM 5X each digit    Weight Bearing Position Seated    Seated with weight on forearm Pt seated on mat table, OT providing support for elbow and positioning forearm to the side. Pt weightbearing through elbow 2 trials, holding for 30" each. Mod cuing and assist for motor planning     Other Weight-Bearing Exercises 1 Pt seated with RUE placed on mat table to his right with OT supporting at elbow and hand, pt leaning into RUE with elbow slightly  bent, then pushing into elbow and hand to push up into seated position. Pt completing 10X with minimal discomfort      Manual Therapy   Manual Therapy Myofascial release    Manual therapy comments Manual therapy completed prior to exercises.     Myofascial Release Myofascial release and manual stretching completed to right UE (shoulder and upper arm) to decrease fascial restrictions and increase joint mobility in a pain free zone.                      OT Short Term Goals - 06/05/20 1651      OT SHORT TERM GOAL #1   Title Patient will be educated on a HEP for ADL completion and improved RUE function.    Time 6    Period Weeks    Status On-going    Target Date 07/02/20      OT SHORT TERM GOAL #2   Title Paitent will complete dressing, bathing, grooming, and toileting with min pa.    Time 6    Period Weeks    Status On-going      OT SHORT TERM  GOAL #3   Title Patient will propel wheelchair independently and safely household distances.    Time 6    Period Weeks    Status On-going      OT SHORT TERM GOAL #4   Title Patients vision will be assessed for deficits.    Time 6    Period Weeks    Status On-going      OT SHORT TERM GOAL #5   Title Patient will use RUE as a gross assist during ADL completion and transfers.    Time 6    Period Weeks    Status On-going      OT SHORT TERM GOAL #6   Title Patient will weightbear on his RUE with mod  facilitation to maintain positioning while shifting weight on and off of RUE during functional tasks.    Time 6    Period Weeks    Status On-going             OT Long Term Goals - 06/05/20 1651      OT LONG TERM GOAL #1   Title Patient will complete B/IADLS at highest level of independence possible.    Time 12    Period Weeks    Status On-going      OT LONG TERM GOAL #2   Title Patent will complete dressing, bathing, grooming, and toileting independently.    Time 12    Period Weeks    Status On-going      OT LONG TERM GOAL #3   Title Paitent will be educated and independent with compensatory techniques for visual deficits.    Time 12    Period Weeks    Status On-going      OT LONG TERM GOAL #4   Title Patient will use his RUE as an active assist during ADL completion.    Time 12    Period Weeks    Status On-going      OT LONG TERM GOAL #5   Title Paitent will weightbear on his RUE and weightshift during ADLs independently.    Time 12    Period Weeks    Status On-going      OT LONG TERM GOAL #6   Title Patient will improve tone in RUE from Brunnstrom I to III in order to use RUE actively  during adl completion.    Time 12    Period Weeks    Status On-going                 Plan - 07/22/20 1155    Clinical Impression Statement A: Completed manual therapy to right shoulder this session to address pain. Pt's daughter reports he slept on that shoulder last  night and it is sore today. Continued with weight-bearing however pt continues to demonstrate hesitancy and poor motor planning during tasks. Continued with self-ROM throughout RUE. Verbal and visual cuing for form and technique.    Body Structure / Function / Physical Skills ADL;Decreased knowledge of use of DME;Strength;Tone;Pain;GMC;Dexterity;Balance;UE functional use;ROM;IADL;Vision;Coordination;FMC    Plan P: Follow up on HEP. Provide sling when it arrives. Discontinue weightbearing due to poor motor planning and carryover    OT Home Exercise Plan eval: weighbearing, functional adl completion. 7/29 family performing K-taping; 8/18: self-ROM    Consulted and Agree with Plan of Care Patient           Patient will benefit from skilled therapeutic intervention in order to improve the following deficits and impairments:   Body Structure / Function / Physical Skills: ADL, Decreased knowledge of use of DME, Strength, Tone, Pain, GMC, Dexterity, Balance, UE functional use, ROM, IADL, Vision, Coordination, [facility]       Visit Diagnosis: Hemiplegia and hemiparesis following cerebral infarction affecting right dominant side (HCC)  Acute pain of right shoulder    Problem List Patient Active Problem List   Diagnosis Date Noted   Dysphagia, post-stroke    Essential hypertension    Benign essential HTN    SAH (subarachnoid hemorrhage) (HCC) 04/16/2020   Cerebral edema (HCC) 04/16/2020   Pneumonia 04/16/2020   Hypertensive emergency 04/16/2020   Hyperlipidemia 04/16/2020   Diabetes mellitus type II, uncontrolled (HCC) 04/16/2020   Dysphagia 04/16/2020   Protein-calorie malnutrition (HCC) 04/16/2020   Urinary retention 04/16/2020   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 [place], [place], [address] Phone: [phone]   Fax:  [phone]  Name: Gamble Enderle MRN: [id number] Date of Birth: 1961/11/15

## 2020-07-23 ENCOUNTER — Ambulatory Visit (HOSPITAL_COMMUNITY): Payer: BC Managed Care – PPO

## 2020-07-24 ENCOUNTER — Ambulatory Visit (HOSPITAL_COMMUNITY): Payer: BC Managed Care – PPO | Admitting: Physical Therapy

## 2020-07-27 ENCOUNTER — Ambulatory Visit (HOSPITAL_COMMUNITY): Payer: BC Managed Care – PPO

## 2020-07-27 ENCOUNTER — Encounter (HOSPITAL_COMMUNITY): Payer: Self-pay

## 2020-07-27 ENCOUNTER — Other Ambulatory Visit: Payer: Self-pay

## 2020-07-27 ENCOUNTER — Ambulatory Visit (HOSPITAL_COMMUNITY): Payer: BC Managed Care – PPO | Admitting: Physical Therapy

## 2020-07-27 DIAGNOSIS — I69351 Hemiplegia and hemiparesis following cerebral infarction affecting right dominant side: Secondary | ICD-10-CM | POA: Diagnosis not present

## 2020-07-27 DIAGNOSIS — M25511 Pain in right shoulder: Secondary | ICD-10-CM

## 2020-07-27 DIAGNOSIS — H9319 Tinnitus, unspecified ear: Secondary | ICD-10-CM | POA: Diagnosis not present

## 2020-07-27 DIAGNOSIS — I10 Essential (primary) hypertension: Secondary | ICD-10-CM | POA: Diagnosis not present

## 2020-07-27 DIAGNOSIS — E782 Mixed hyperlipidemia: Secondary | ICD-10-CM | POA: Diagnosis not present

## 2020-07-27 DIAGNOSIS — D696 Thrombocytopenia, unspecified: Secondary | ICD-10-CM | POA: Diagnosis not present

## 2020-07-27 DIAGNOSIS — M6281 Muscle weakness (generalized): Secondary | ICD-10-CM | POA: Diagnosis not present

## 2020-07-27 DIAGNOSIS — E1165 Type 2 diabetes mellitus with hyperglycemia: Secondary | ICD-10-CM | POA: Diagnosis not present

## 2020-07-27 DIAGNOSIS — R2689 Other abnormalities of gait and mobility: Secondary | ICD-10-CM | POA: Diagnosis not present

## 2020-07-27 DIAGNOSIS — I69820 Aphasia following other cerebrovascular disease: Secondary | ICD-10-CM | POA: Diagnosis not present

## 2020-07-27 NOTE — Patient Outreach (Signed)
[facility]) Care Management  07/27/2020  Brad Delacruz 25-Nov-1961 [id number]  Second telephone outreach attempt to obtain mRS. No answer. Left message for returned call. (used an interpreter)  Domingo Cocking [facility] Management Assistant [phone]

## 2020-07-27 NOTE — Therapy (Signed)
[facility] [address] [place], [place], [address] Phone: [phone]   Fax:  [phone]  Occupational Therapy Treatment  Patient Details  Name: Brad Delacruz MRN: [id number] Date of Birth: 07/16/1961 Referring Provider (OT): Mariam Dollar, [place]    Encounter Date: 07/27/2020   OT End of Session - 07/27/20 1530    Visit Number 18    Number of Visits 24    Date for OT Re-Evaluation 08/21/20    Authorization Type BCBS PPO    Authorization Time Period no auth no visit limit    OT Start Time 1348    OT Stop Time 1430    OT Time Calculation (min) 42 min    Activity Tolerance Patient tolerated treatment well    Behavior During Therapy [facility] for tasks assessed/performed           Past Medical History:  Diagnosis Date  . Diabetes mellitus without complication (HCC)   . Stroke [facility])     Past Surgical History:  Procedure Laterality Date  . IR CT HEAD LTD  03/24/2020  . IR CT HEAD LTD  03/24/2020  . IR INTRA CRAN STENT  03/24/2020  . IR PATIENT EVAL TECH 0-60 MINS  03/25/2020  . IR PERCUTANEOUS ART THROMBECTOMY/INFUSION INTRACRANIAL INC DIAG ANGIO  03/24/2020  . RADIOLOGY WITH ANESTHESIA N/A 03/24/2020   Procedure: IR WITH ANESTHESIA;  Surgeon: Julieanne Cotton, MD;  Location: [facility] OR;  Service: Radiology;  Laterality: N/A;    There were no vitals filed for this visit.   Subjective Assessment - 07/27/20 1521    Subjective  S: Patient confirms that he has pain when his arm is being stretched.    Patient is accompanied by: Family member;Interpreter    Currently in Pain? No/denies              [facility] OT Assessment - 07/27/20 1522      Assessment   Medical Diagnosis LT MCA       Precautions   Precautions Fall                    OT Treatments/Exercises (OP) - 07/27/20 1523      Bed Mobility   Bed Mobility Sit to Supine;Supine to Sit    Supine to Sit Minimal Assistance - Patient > 75%    Sit to Supine Minimal  Assistance - Patient > 75%      Transfers   Sit to Stand 4: Min guard   towards left side   Stand to Sit 4: Min guard   towards left side     Exercises   Exercises Shoulder;Elbow;Wrist;Hand      Shoulder Exercises: Supine   Protraction PROM;5 reps    Horizontal ABduction PROM;5 reps    External Rotation PROM;5 reps    Internal Rotation PROM;5 reps    Flexion PROM;5 reps    ABduction PROM;5 reps      Shoulder Exercises: Seated   Horizontal ABduction Other (comment)   self ROM, 5X   External Rotation Other (comment)   Self ROM; 5X   Internal Rotation Other (comment)   5X   Flexion Other (comment)   self ROM; 5X   Abduction Other (comment)   Self ROM; 5X     Elbow Exercises   Elbow Extension Self ROM;5 reps;Seated    Forearm Supination Self ROM;5 reps;Seated;PROM;10 reps    Forearm Pronation PROM;10 reps;Self ROM;5 reps;Seated      Wrist Exercises   Wrist  Flexion PROM;5 reps;Self ROM;10 reps    Wrist Extension PROM;5 reps;Self ROM;10 reps    Wrist Radial Deviation PROM;5 reps;Self ROM;10 reps    Wrist Ulnar Deviation PROM;5 reps;Self ROM;10 reps      Hand Exercises   Other Hand Exercises Composite digit flexion/extension; P/ROM, Self ROM; 5X      Modalities   Modalities Moist Heat      Moist Heat Therapy   Number Minutes Moist Heat 5 Minutes    Moist Heat Location Shoulder                    OT Short Term Goals - 06/05/20 1651      OT SHORT TERM GOAL #1   Title Patient will be educated on a HEP for ADL completion and improved RUE function.    Time 6    Period Weeks    Status On-going    Target Date 07/02/20      OT SHORT TERM GOAL #2   Title Paitent will complete dressing, bathing, grooming, and toileting with min pa.    Time 6    Period Weeks    Status On-going      OT SHORT TERM GOAL #3   Title Patient will propel wheelchair independently and safely household distances.    Time 6    Period Weeks    Status On-going      OT SHORT TERM GOAL #4    Title Patients vision will be assessed for deficits.    Time 6    Period Weeks    Status On-going      OT SHORT TERM GOAL #5   Title Patient will use RUE as a gross assist during ADL completion and transfers.    Time 6    Period Weeks    Status On-going      OT SHORT TERM GOAL #6   Title Patient will weightbear on his RUE with mod  facilitation to maintain positioning while shifting weight on and off of RUE during functional tasks.    Time 6    Period Weeks    Status On-going             OT Long Term Goals - 06/05/20 1651      OT LONG TERM GOAL #1   Title Patient will complete B/IADLS at highest level of independence possible.    Time 12    Period Weeks    Status On-going      OT LONG TERM GOAL #2   Title Patent will complete dressing, bathing, grooming, and toileting independently.    Time 12    Period Weeks    Status On-going      OT LONG TERM GOAL #3   Title Paitent will be educated and independent with compensatory techniques for visual deficits.    Time 12    Period Weeks    Status On-going      OT LONG TERM GOAL #4   Title Patient will use his RUE as an active assist during ADL completion.    Time 12    Period Weeks    Status On-going      OT LONG TERM GOAL #5   Title Paitent will weightbear on his RUE and weightshift during ADLs independently.    Time 12    Period Weeks    Status On-going      OT LONG TERM GOAL #6   Title Patient will improve tone in RUE from Brunnstrom  I to III in order to use RUE actively during adl completion.    Time 12    Period Weeks    Status On-going                 Plan - 07/27/20 1530    Clinical Impression Statement A: Pt declined kinesiotaping of right shoulder this date. Continues to require min guard to min assist for functional transfer and mobility. Wife arrived provided total assist for wheelchair mobility. Completed self ROM for RUE with therapist. Required min assist and set up with VC for form and  technique.    Body Structure / Function / Physical Skills ADL;Decreased knowledge of use of DME;Strength;Tone;Pain;GMC;Dexterity;Balance;UE functional use;ROM;IADL;Vision;Coordination;FMC    Plan P: Mini reassessment/progress note. If no progress will need to discuss discharge. Checking on sling order.    Consulted and Agree with Plan of Care Patient    Family Member Consulted wife [name])           Patient will benefit from skilled therapeutic intervention in order to improve the following deficits and impairments:   Body Structure / Function / Physical Skills: ADL, Decreased knowledge of use of DME, Strength, Tone, Pain, GMC, Dexterity, Balance, UE functional use, ROM, IADL, Vision, Coordination, [facility]       Visit Diagnosis: Acute pain of right shoulder  Hemiplegia and hemiparesis following cerebral infarction affecting right dominant side [facility])    Problem List Patient Active Problem List   Diagnosis Date Noted  . Dysphagia, post-stroke   . Essential hypertension   . Benign essential HTN   . SAH (subarachnoid hemorrhage) (HCC) 04/16/2020  . Cerebral edema (HCC) 04/16/2020  . Pneumonia 04/16/2020  . Hypertensive emergency 04/16/2020  . Hyperlipidemia 04/16/2020  . Diabetes mellitus type II, uncontrolled (HCC) 04/16/2020  . Dysphagia 04/16/2020  . Protein-calorie malnutrition (HCC) 04/16/2020  . Urinary retention 04/16/2020  . 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 Acute respiratory failure (HCC)   . Acute ischemic stroke (HCC) L MCA d/t L M1 oclusion s/p MCA stent 03/24/2020  . CVA (cerebral vascular accident) (HCC) 03/24/2020  . Middle cerebral artery embolism, left 03/24/2020   Limmie Patricia, OTR/L,CBIS  [phone]  07/27/2020, 3:39 PM  [facility] [address] [place], [place], [address] Phone: [phone]   Fax:  [phone]  Name: Brad Delacruz MRN: [id number] Date of Birth: Dec 16, 1960

## 2020-07-28 ENCOUNTER — Telehealth (HOSPITAL_COMMUNITY): Payer: Self-pay

## 2020-07-28 ENCOUNTER — Ambulatory Visit (HOSPITAL_COMMUNITY): Payer: BC Managed Care – PPO | Admitting: Physical Therapy

## 2020-07-28 NOTE — Telephone Encounter (Signed)
Called to schedule f/u cta, no answer, no vm. AW  

## 2020-07-29 ENCOUNTER — Encounter (HOSPITAL_COMMUNITY): Payer: BC Managed Care – PPO

## 2020-07-29 ENCOUNTER — Other Ambulatory Visit: Payer: Self-pay

## 2020-07-29 NOTE — Patient Outreach (Signed)
[facility]) Care Management  07/29/2020  [place] 1961-11-23 [id number]  3 outreach attempts were completed to obtain mRs. mRs could not be obtained because patient never returned my calls. mRs=7    [facility] Management Assistant [phone]

## 2020-07-30 DIAGNOSIS — E782 Mixed hyperlipidemia: Secondary | ICD-10-CM | POA: Diagnosis not present

## 2020-07-30 DIAGNOSIS — E1165 Type 2 diabetes mellitus with hyperglycemia: Secondary | ICD-10-CM | POA: Diagnosis not present

## 2020-07-30 DIAGNOSIS — I10 Essential (primary) hypertension: Secondary | ICD-10-CM | POA: Diagnosis not present

## 2020-07-30 DIAGNOSIS — I639 Cerebral infarction, unspecified: Secondary | ICD-10-CM | POA: Diagnosis not present

## 2020-07-31 ENCOUNTER — Ambulatory Visit (HOSPITAL_COMMUNITY): Payer: BC Managed Care – PPO

## 2020-08-03 ENCOUNTER — Other Ambulatory Visit: Payer: Self-pay

## 2020-08-03 ENCOUNTER — Ambulatory Visit (HOSPITAL_COMMUNITY): Payer: BC Managed Care – PPO

## 2020-08-03 ENCOUNTER — Ambulatory Visit (HOSPITAL_COMMUNITY): Payer: BC Managed Care – PPO | Admitting: Physical Therapy

## 2020-08-03 ENCOUNTER — Encounter (HOSPITAL_COMMUNITY): Payer: Self-pay

## 2020-08-03 DIAGNOSIS — I69351 Hemiplegia and hemiparesis following cerebral infarction affecting right dominant side: Secondary | ICD-10-CM

## 2020-08-03 DIAGNOSIS — M25511 Pain in right shoulder: Secondary | ICD-10-CM

## 2020-08-03 DIAGNOSIS — M6281 Muscle weakness (generalized): Secondary | ICD-10-CM | POA: Diagnosis not present

## 2020-08-03 DIAGNOSIS — R2689 Other abnormalities of gait and mobility: Secondary | ICD-10-CM | POA: Diagnosis not present

## 2020-08-03 DIAGNOSIS — I69820 Aphasia following other cerebrovascular disease: Secondary | ICD-10-CM | POA: Diagnosis not present

## 2020-08-03 NOTE — Therapy (Signed)
[facility] [address] [place], [place], [address] Phone: [phone]   Fax:  [phone]  Occupational Therapy Treatment  Patient Details  Name: Brad Delacruz MRN: [id number] Date of Birth: [birth year] Referring Provider (OT): Brad Delacruz, [place]    Encounter Date: 08/03/2020   OT End of Session - 08/03/20 1706    Visit Number 19    Number of Visits 24    Date for OT Re-Evaluation 08/21/20    Authorization Type BCBS PPO    Authorization Time Period no auth no visit limit    OT Start Time 1345    OT Stop Time 1427    OT Time Calculation (min) 42 min    Activity Tolerance Patient tolerated treatment well    Behavior During Therapy [name] for tasks assessed/performed           Past Medical History:  Diagnosis Date  . Diabetes mellitus without complication (HCC)   . Stroke [facility])     Past Surgical History:  Procedure Laterality Date  . IR CT HEAD LTD  03/24/2020  . IR CT HEAD LTD  03/24/2020  . IR INTRA CRAN STENT  03/24/2020  . IR PATIENT EVAL TECH 0-60 MINS  03/25/2020  . IR PERCUTANEOUS ART THROMBECTOMY/INFUSION INTRACRANIAL INC DIAG ANGIO  03/24/2020  . RADIOLOGY WITH ANESTHESIA N/A 03/24/2020   Procedure: IR WITH ANESTHESIA;  Surgeon: Brad Cotton, MD;  Location: [facility] OR;  Service: Radiology;  Laterality: N/A;    There were no vitals filed for this visit.   Subjective Assessment - 08/03/20 1432    Subjective  S: Patient reports not pain.    Patient is accompanied by: Family member;Interpreter    Currently in Pain? No/denies              [name] OT Assessment - 08/03/20 1433      Assessment   Medical Diagnosis LT MCA       Precautions   Precautions Fall                    OT Treatments/Exercises (OP) - 08/03/20 0001      Splinting   Splinting GivMohr Sling ordered and attempted to fit patient for use at home.                   OT Education - 08/03/20 1702    Education Details  Attempted to fit patient with GivMohr Hemi arm sling on right arm. When sling did not provide appropriate support to RUE as patient spends majority of day in wheelchair recommended alternative arm supports:Maddak Arm escort and Bauerfeind omotrain active shoulder support.    Person(s) Educated Patient;Spouse    Methods Explanation    Comprehension Verbalized understanding            OT Short Term Goals - 06/05/20 1651      OT SHORT TERM GOAL #1   Title Patient will be educated on a HEP for ADL completion and improved RUE function.    Time 6    Period Weeks    Status On-going    Target Date 07/02/20      OT SHORT TERM GOAL #2   Title Paitent will complete dressing, bathing, grooming, and toileting with min pa.    Time 6    Period Weeks    Status On-going      OT SHORT TERM GOAL #3   Title Patient will propel wheelchair independently and safely household distances.  Time 6    Period Weeks    Status On-going      OT SHORT TERM GOAL #4   Title Patients vision will be assessed for deficits.    Time 6    Period Weeks    Status On-going      OT SHORT TERM GOAL #5   Title Patient will use RUE as a gross assist during ADL completion and transfers.    Time 6    Period Weeks    Status On-going      OT SHORT TERM GOAL #6   Title Patient will weightbear on his RUE with mod  facilitation to maintain positioning while shifting weight on and off of RUE during functional tasks.    Time 6    Period Weeks    Status On-going             OT Long Term Goals - 06/05/20 1651      OT LONG TERM GOAL #1   Title Patient will complete B/IADLS at highest level of independence possible.    Time 12    Period Weeks    Status On-going      OT LONG TERM GOAL #2   Title Patent will complete dressing, bathing, grooming, and toileting independently.    Time 12    Period Weeks    Status On-going      OT LONG TERM GOAL #3   Title Paitent will be educated and independent with  compensatory techniques for visual deficits.    Time 12    Period Weeks    Status On-going      OT LONG TERM GOAL #4   Title Patient will use his RUE as an active assist during ADL completion.    Time 12    Period Weeks    Status On-going      OT LONG TERM GOAL #5   Title Paitent will weightbear on his RUE and weightshift during ADLs independently.    Time 12    Period Weeks    Status On-going      OT LONG TERM GOAL #6   Title Patient will improve tone in RUE from Brunnstrom I to III in order to use RUE actively during adl completion.    Time 12    Period Weeks    Status On-going                 Plan - 08/03/20 1707    Clinical Impression Statement A: Attempted to fit patient with GivMohr Hemi arm sling on right arm. When sling did not provide appropriate support to RUE as patient spends majority of day in wheelchair recommended alternative arm supports:Maddak Arm escort and Bauerfeind omotrain active shoulder support.    Body Structure / Function / Physical Skills ADL;Decreased knowledge of use of DME;Strength;Tone;Pain;GMC;Dexterity;Balance;UE functional use;ROM;IADL;Vision;Coordination;FMC    Plan P: Reassessment and discharge. Review goals. Continue with established HEP. New arm supports have been ordered and patient will notified when they arrive.    Consulted and Agree with Plan of Care Patient;Family member/caregiver    Family Member Consulted wife Brad Delacruz)           Patient will benefit from skilled therapeutic intervention in order to improve the following deficits and impairments:   Body Structure / Function / Physical Skills: ADL, Decreased knowledge of use of DME, Strength, Tone, Pain, GMC, Dexterity, Balance, UE functional use, ROM, IADL, Vision, Coordination, [name]       Visit Diagnosis: Acute  pain of right shoulder  Hemiplegia and hemiparesis following cerebral infarction affecting right dominant side [name])    Problem List Patient Active Problem  List   Diagnosis Date Noted  . Dysphagia, post-stroke   . Essential hypertension   . Benign essential HTN   . SAH (subarachnoid hemorrhage) (HCC) 04/16/2020  . Cerebral edema (HCC) 04/16/2020  . Pneumonia 04/16/2020  . Hypertensive emergency 04/16/2020  . Hyperlipidemia 04/16/2020  . Diabetes mellitus type II, uncontrolled (HCC) 04/16/2020  . Dysphagia 04/16/2020  . Protein-calorie malnutrition (HCC) 04/16/2020  . Urinary retention 04/16/2020  . 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 Acute respiratory failure (HCC)   . Acute ischemic stroke (HCC) L MCA d/t L M1 oclusion s/p MCA stent 03/24/2020  . CVA (cerebral vascular accident) (HCC) 03/24/2020  . Middle cerebral artery embolism, left 03/24/2020   Limmie Patricia, OTR/L,CBIS  [phone]  08/03/2020, 5:10 PM  [place] [facility] [address] [place], [place], [address] Phone: [phone]   Fax:  [phone]  Name: Brad Delacruz MRN: [id number] Date of Birth: 1961/04/09

## 2020-08-03 NOTE — Patient Instructions (Signed)
[email or web address]  [email or web address]

## 2020-08-06 ENCOUNTER — Other Ambulatory Visit: Payer: Self-pay

## 2020-08-06 ENCOUNTER — Encounter (HOSPITAL_COMMUNITY): Payer: Self-pay

## 2020-08-06 ENCOUNTER — Ambulatory Visit (HOSPITAL_COMMUNITY): Payer: BC Managed Care – PPO | Admitting: Physical Therapy

## 2020-08-06 ENCOUNTER — Ambulatory Visit (HOSPITAL_COMMUNITY): Payer: BC Managed Care – PPO | Attending: Physician Assistant

## 2020-08-06 DIAGNOSIS — M25511 Pain in right shoulder: Secondary | ICD-10-CM | POA: Insufficient documentation

## 2020-08-06 DIAGNOSIS — I69351 Hemiplegia and hemiparesis following cerebral infarction affecting right dominant side: Secondary | ICD-10-CM | POA: Diagnosis not present

## 2020-08-06 NOTE — Therapy (Signed)
[facility] [address] [place], [place], [address] Phone: [phone]   Fax:  [phone]  Occupational Therapy Treatment Reassessment/discharge summary Patient Details  Name: Brad Delacruz MRN: [id number] Date of Birth: 24-Dec-1960 Referring Provider (OT): Lauraine Rinne, [place]    Encounter Date: 08/06/2020   OT End of Session - 08/06/20 1424    Visit Number 20    Number of Visits 24    Date for OT Re-Evaluation 08/21/20    Authorization Type [employer] PPO    Authorization Time Period no auth no visit limit    OT Start Time 6644   reassess and discharge   OT Stop Time 1413    OT Time Calculation (min) 25 min    Activity Tolerance Patient tolerated treatment well    Behavior During Therapy Flat affect   Pt's mannerism was silent with low participation.          Past Medical History:  Diagnosis Date  . Diabetes mellitus without complication ([place])   . Stroke [facility])     Past Surgical History:  Procedure Laterality Date  . IR CT HEAD LTD  03/24/2020  . IR CT HEAD LTD  03/24/2020  . IR INTRA CRAN STENT  03/24/2020  . IR PATIENT EVAL TECH 0-60 MINS  03/25/2020  . IR PERCUTANEOUS ART THROMBECTOMY/INFUSION INTRACRANIAL INC DIAG ANGIO  03/24/2020  . RADIOLOGY WITH ANESTHESIA N/A 03/24/2020   Procedure: IR WITH ANESTHESIA;  Surgeon: Luanne Bras, MD;  Location: [place];  Service: Radiology;  Laterality: N/A;    There were no vitals filed for this visit.   Subjective Assessment - 08/06/20 1353    Subjective  S: Wife reports that her daugher and her are doing the exercises with [place].    Patient is accompanied by: Family member;Interpreter   Phone interpreter: Danae Chen [phone]   Currently in Pain? No/denies              [facility] OT Assessment - 08/06/20 1416      Assessment   Medical Diagnosis LT MCA       Precautions   Precautions Fall      ADL   ADL comments Wife reports that she lets [place] do basic ADL tasks as much as he  can as long as it is safe. She does provide assistance when he needs it. Estimate that he requires Min-moderate physical assistance for  ADL tasks for safety and right side hemiparesis.       Palpation   Palpation comment 1 finger width right shoulder subluxation      AROM   Overall AROM Comments No active movement in the RUE including Scapular movement, shoulder, elbow, wrist and hand.       RUE Tone   RUE Tone Brunnstrom Scale    Brunnstrom Scale (RUE) More marked increase in muscle tone through most of the ROM, but affected part(s) easily moved   in shoulder and elbow. Wrist has considerable more tone                           OT Education - 08/06/20 1421    Education Details reviewed goals and progress in therapy. Discussed continuing with HEP which includes Self ROM. Encouraged patient to complete as much as he can during bathing, dressing, and grooming tasks. Wife verbalized understanding. Informed both patient and wife that [employer] escort and [employer] omotrain active shoulder support has been ordered. Once it arrives we  will call to have him come in for a quick visit and make sure it visits.    Person(s) Educated Patient;Spouse    Methods Explanation    Comprehension Verbalized understanding            OT Short Term Goals - 08/06/20 1353      OT SHORT TERM GOAL #1   Title Patient will be educated on a HEP for ADL completion and improved RUE function.    Time 6    Period Weeks    Status Achieved    Target Date 07/02/20      OT SHORT TERM GOAL #2   Title Paitent will complete dressing, bathing, grooming, and toileting with min pa.    Time 6    Period Weeks    Status Partially Met      OT SHORT TERM GOAL #3   Title Patient will propel wheelchair independently and safely household distances.    Baseline Goal deferred as patient spends his time on the sofa at home and only uses wheelchair in the community where family will provide assist for  wheelchair mobility.    Time 6    Period Weeks    Status Deferred      OT SHORT TERM GOAL #4   Title Patients vision will be assessed for deficits.    Time 6    Period Weeks    Status Achieved      OT SHORT TERM GOAL #5   Title Patient will use RUE as a gross assist during ADL completion and transfers.    Time 6    Period Weeks    Status Not Met      OT SHORT TERM GOAL #6   Title Patient will weightbear on his RUE with mod  facilitation to maintain positioning while shifting weight on and off of RUE during functional tasks.    Time 6    Period Weeks    Status Achieved             OT Long Term Goals - 08/06/20 1404      OT LONG TERM GOAL #1   Title Patient will complete B/IADLS at highest level of independence possible.    Time 12    Period Weeks    Status Achieved      OT LONG TERM GOAL #2   Title Patent will complete dressing, bathing, grooming, and toileting independently.    Time 12    Period Weeks    Status Not Met      OT LONG TERM GOAL #3   Title Paitent will be educated and independent with compensatory techniques for visual deficits.    Time 12    Period Weeks    Status Achieved      OT LONG TERM GOAL #4   Title Patient will use his RUE as an active assist during ADL completion.    Time 12    Period Weeks    Status Not Met      OT LONG TERM GOAL #5   Title Paitent will weightbear on his RUE and weightshift during ADLs independently.    Time 12    Period Weeks    Status Not Met      OT LONG TERM GOAL #6   Title Patient will improve tone in RUE from Brunnstrom I to III in order to use RUE actively during adl completion.    Baseline Goal no longer appropriate for patient.    Time  12    Period Weeks    Status Deferred                 Plan - 08/06/20 1428    Clinical Impression Statement A: Reassessment completed this date. Pt met 3/5 STGs with 1 deferred and 1 partially met. 3/5 LTGs have been met also. Patient currently spends the  majority of the day on his sofa and will use his wheelchair when out in the community for appointments. He does not able to demonstrate any active movement in his RUE and is experiencing 1 finger subluxation. Pain is felt during passive ROM and patient is experiencing some increased tone in his shoulder, elbow, wrist, and hand. Pt has been provided with an appropriate HEP to continue at home with family assisting. All education has been completed regarding ADL completion and patient attempting to complete as much as he can himself. Since start of therapy, patient has not experienced any active movement in his RUE and no additional skilled OT services are needed at this time. Discharge was recommended with patient utilizing education provided during session. Wife and patient verbalized understanding.    Body Structure / Function / Physical Skills ADL;Decreased knowledge of use of DME;Strength;Tone;Pain;GMC;Dexterity;Balance;UE functional use;ROM;IADL;Vision;Coordination;FMC    Consulted and Agree with Plan of Care Patient;Family member/caregiver    Family Member Consulted wife Lonna Duval)           Patient will benefit from skilled therapeutic intervention in order to improve the following deficits and impairments:   Body Structure / Function / Physical Skills: ADL, Decreased knowledge of use of DME, Strength, Tone, Pain, GMC, Dexterity, Balance, UE functional use, ROM, IADL, Vision, Coordination, [facility]       Visit Diagnosis: Acute pain of right shoulder  Hemiplegia and hemiparesis following cerebral infarction affecting right dominant side [facility])    Problem List Patient Active Problem List   Diagnosis Date Noted  . Dysphagia, post-stroke   . Essential hypertension   . Benign essential HTN   . SAH (subarachnoid hemorrhage) ([place]) 04/16/2020  . Cerebral edema ([place]) 04/16/2020  . Pneumonia 04/16/2020  . Hypertensive emergency 04/16/2020  . Hyperlipidemia 04/16/2020  . Diabetes mellitus type  II, uncontrolled ([place]) 04/16/2020  . Dysphagia 04/16/2020  . Protein-calorie malnutrition ([place]) 04/16/2020  . Urinary retention 04/16/2020  . 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 Acute respiratory failure ([place])   . Acute ischemic stroke ([place]) L MCA d/t L M1 oclusion s/p MCA stent 03/24/2020  . CVA (cerebral vascular accident) ([place]) 03/24/2020  . Middle cerebral artery embolism, left 03/24/2020    OCCUPATIONAL THERAPY DISCHARGE SUMMARY  Visits from Start of Care: 20  Current functional level related to goals / functional outcomes: See above   Remaining deficits: See above   Education / Equipment: See above Plan: Patient agrees to discharge.  Patient goals were partially met. Patient is being discharged due to lack of progress.  ?????        Ailene Ravel, OTR/L,CBIS  [phone]   08/06/2020, 2:51 PM  [facility] [address] [place], [place], [address] Phone: [phone]   Fax:  [phone]  Name: Brad Delacruz MRN: [id number] Date of Birth: [birth year]

## 2020-08-11 ENCOUNTER — Encounter: Payer: Self-pay | Admitting: Physical Medicine & Rehabilitation

## 2020-08-11 ENCOUNTER — Other Ambulatory Visit: Payer: Self-pay

## 2020-08-11 ENCOUNTER — Telehealth (HOSPITAL_COMMUNITY): Payer: Self-pay

## 2020-08-11 ENCOUNTER — Encounter: Payer: BC Managed Care – PPO | Attending: Registered Nurse | Admitting: Physical Medicine & Rehabilitation

## 2020-08-11 VITALS — BP 122/77 | HR 68 | Temp 98.2°F | Ht 65.0 in

## 2020-08-11 DIAGNOSIS — I69390 Apraxia following cerebral infarction: Secondary | ICD-10-CM | POA: Diagnosis not present

## 2020-08-11 DIAGNOSIS — I69351 Hemiplegia and hemiparesis following cerebral infarction affecting right dominant side: Secondary | ICD-10-CM | POA: Diagnosis not present

## 2020-08-11 DIAGNOSIS — I10 Essential (primary) hypertension: Secondary | ICD-10-CM | POA: Diagnosis not present

## 2020-08-11 DIAGNOSIS — I63512 Cerebral infarction due to unspecified occlusion or stenosis of left middle cerebral artery: Secondary | ICD-10-CM | POA: Diagnosis not present

## 2020-08-11 DIAGNOSIS — I69320 Aphasia following cerebral infarction: Secondary | ICD-10-CM

## 2020-08-11 DIAGNOSIS — E7849 Other hyperlipidemia: Secondary | ICD-10-CM | POA: Insufficient documentation

## 2020-08-11 NOTE — Telephone Encounter (Signed)
Third attempt to contact pt to schedule cta head/neck, no answer, no vm. AW

## 2020-08-11 NOTE — Progress Notes (Signed)
Subjective:    Patient ID: Brad Delacruz, male    DOB: 04-Sep-1961, 59 y.o.   MRN: [id number] 59 y.o. right-handed male with history of diabetes mellitus and hypertension.  Per chart review lives with spouse independent prior to admission.  Presented 03/24/2020 with right-sided weakness and aphasia to [facility].  Admission chemistries alcohol negative sodium 130 potassium 3.3 glucose 309 hemoglobin A1c 9.2 urine drug screen positive benzos.  Cranial CT scan negative for acute changes.  Remote infarct of the left corona radiata internal capsule.  CT cerebral perfusion scan as well as CT angiogram of head and neck showed a large left MCA territory nonhemorrhagic infarct as well as acute left M1 large vessel occlusion.  Patient was transferred to [facility].  Patient underwent TPA followed by mechanical thrombectomy revascularization per interventional radiology.  Echocardiogram with ejection fraction of 60% grade 1 diastolic dysfunction.  TCD bubble study positive for small PFO and lower extremity Dopplers negative for DVT.  Patient remained intubated through 04/01/2020.  Latest follow-up cranial CT scan 04/08/2020 showed evolving large left MCA territory infarct with similar mass-effect no new hemorrhage or hydrocephalus.  Maintained on low-dose aspirin as well as Brilinta for CVA prophylaxis.  Considerations to be made for TEE and loop recorder as outpatient.  Subcutaneous heparin for DVT prophylaxis.  Nasogastric tube for nutritional support diet slowly advanced.  He did complete a course of Ancef 04/05/2020 for pneumonia.  Patient was admitted for a comprehensive rehab program  Admit date: 04/16/2020 Discharge date: 05/13/2020   HPI The patient was last seen 06/30/2020.  Since that time he has completed PT OT.  His insurance would not cover speech therapy and his daughter has contacted [employer].  They do not have any pro bono speech therapy slots available at this time but may have some next  semester.  They are planning to attend aphasia support groups. The patient has not had any falls.  He still requires help for dressing and bathing.  He is nonambulatory and mainly in a wheelchair.  He requires physical assistance for transfers. According to daughter, [name] who is with him today, the patient has no significant shoulder pain at this time.  He does have some tone in his fingers and wrist but these are managed by stretching.  There have been no spasms in the lower extremity.  He does appear to have pain every few days in his right leg.  This is not accompanied by shaking.  There is been no falls or trauma to that area or no history of any surgeries to the right lower extremity. The patient is aphasic and apraxic and is unable to answer questions.   Pain Inventory Average Pain 0 Pain Right Now 0 My pain is no pain  LOCATION OF PAIN    BOWEL Number of stools per week: 1-2 a day Oral laxative use No  Type of laxative None Enema or suppository use No  History of colostomy No  Incontinent No   BLADDER Normal In and out cath, frequency not applicable Able to self cath No  Bladder incontinence No  Frequent urination No  Leakage with coughing No  Difficulty starting stream Yes  Incomplete bladder emptying No    Mobility use a walker how many minutes can you walk? Maybe  5 mins with PT ability to climb steps?  no do you drive?  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  yes  Neuro/Psych weakness trouble walking  Prior Studies Any changes since last visit?  no  Physicians involved in your care Any changes since last visit?  no   Family History  Problem Relation Age of Onset  . Stroke Mother   . Hypertension Mother   . Heart attack Father   . Diabetes Father    Social  History   Socioeconomic History  . Marital status: Married    Spouse name: Not on file  . Number of children: 5  . Years of education: Not on file  . Highest education level: Not on file  Occupational History  . Not on file  Tobacco Use  . Smoking status: Never Smoker  . Smokeless tobacco: Never Used  Vaping Use  . Vaping Use: Former  Substance and Sexual Activity  . Alcohol use: Not Currently  . Drug use: Never  . Sexual activity: Not on file  Other Topics Concern  . Not on file  Social History Narrative   Lives with wife, and 1 daughter   Social Determinants of Health   Financial Resource Strain:   . Difficulty of Paying Living Expenses: Not on file  Food Insecurity:   . Worried About Programme researcher, broadcasting/film/video in the Last Year: Not on file  . Ran Out of Food in the Last Year: Not on file  Transportation Needs:   . Lack of Transportation (Medical): Not on file  . Lack of Transportation (Non-Medical): Not on file  Physical Activity:   . Days of Exercise per Week: Not on file  . Minutes of Exercise per Session: Not on file  Stress:   . Feeling of Stress : Not on file  Social Connections:   . Frequency of Communication with Friends and Family: Not on file  . Frequency of Social Gatherings with Friends and Family: Not on file  . Attends Religious Services: Not on file  . Active Member of Clubs or Organizations: Not on file  . Attends Banker Meetings: Not on file  . Marital Status: Not on file   Past Surgical History:  Procedure Laterality Date  . IR CT HEAD LTD  03/24/2020  . IR CT HEAD LTD  03/24/2020  . IR INTRA CRAN STENT  03/24/2020  . IR PATIENT EVAL TECH 0-60 MINS  03/25/2020  . IR PERCUTANEOUS ART THROMBECTOMY/INFUSION INTRACRANIAL INC DIAG ANGIO  03/24/2020  . RADIOLOGY WITH ANESTHESIA N/A 03/24/2020   Procedure: IR WITH ANESTHESIA;  Surgeon: Julieanne Cotton, MD;  Location: [facility];  Service: Radiology;  Laterality: N/A;   Past Medical History:   Diagnosis Date  . Diabetes mellitus without complication ([facility])   . Stroke ([facility])    BP 122/77   Pulse 68   Temp 98.2 F (36.8 C)   Ht 5\' 5"  (1.651 m)   SpO2 96%   BMI 28.84 kg/m   Opioid Risk Score:   Fall Risk Score:  `1  Depression screen PHQ 2/9  Depression screen PHQ 2/9 05/25/2020  Decreased Interest 0  Down, Depressed, Hopeless 0  PHQ - 2 Score 0  Altered sleeping 0  Tired, decreased energy 0  Change in appetite 0  Feeling bad or failure about yourself  0  Trouble concentrating 1  Moving slowly or fidgety/restless 0  Suicidal thoughts 0  PHQ-9 Score 1   Review of Systems  Musculoskeletal: Positive for  gait problem.  Neurological: Positive for weakness.       Right side of body numbness & weakness       Objective:   Physical Exam Vitals and nursing note reviewed.  Constitutional:      Appearance: He is obese.  Eyes:     Extraocular Movements: Extraocular movements intact.     Pupils: Pupils are equal, round, and reactive to light.  Musculoskeletal:     Comments: No pain with right shoulder range of motion there is 1 fingerbreadth subluxation at the right glenohumeral joint No pain with right hip knee or ankle range of motion. Negative straight leg raising  Neurological:     Mental Status: He is alert.     Motor: Abnormal muscle tone present.     Gait: Gait abnormal.     Comments: Tone MAS 1 in finger and wrist flexors on the right side Tone is diminished in the right lower extremity and is areflexic Patient makes no attempts at verbal communication He is unable to follow simple commands such as touch left ear or raise your arm but can do so with gestural cueing  Motor strength is 0/5 in the right upper extremity 0/5 in the right lower extremity  Nonambulatory  Psychiatric:        Mood and Affect: Mood normal.     Comments: No evidence of lability or agitation    There is no evidence of right knee effusion there is no evidence of ankle  effusion.  No calf tenderness No right lower extremity edema or deformity       Assessment & Plan:  #14.  59 year old male with large left MCA distribution infarct with right hemiplegia, global aphasia as well as apraxia.  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  We discussed timeframe of recovery.  We discussed recommendations on continued speech therapy hopefully from [facility]. We discussed the importance of continued range of motion and doing home exercise program supplied by outpatient PT and OT.  2.  Right lower extremity pain with essentially negative exam this appears to be neurogenic pain that is intermittent in nature.  At this point it is not frequent enough to require a medication such as gabapentin.  If this increases we may need to prescribe this.  Discussed side effects such as sedation with the patient's daughter [name].  #3.  Spasticity right finger and wrist flexors, mild at this point range of motion should be sufficient.  If this worsens may consider oral spasticity medication such as tizanidine or baclofen versus botulinum toxin  Increase time for visit was needed because of patient's aphasia.  Discussed recommendations with patient's daughter who is with the patient today

## 2020-08-11 NOTE — Patient Instructions (Signed)
Patient has aphasia as well as Motor and Speech apraxia which are limiting communication, this still may improve over the next 6 months

## 2020-08-12 DIAGNOSIS — I63512 Cerebral infarction due to unspecified occlusion or stenosis of left middle cerebral artery: Secondary | ICD-10-CM | POA: Diagnosis not present

## 2020-08-13 DIAGNOSIS — I63512 Cerebral infarction due to unspecified occlusion or stenosis of left middle cerebral artery: Secondary | ICD-10-CM | POA: Diagnosis not present

## 2020-08-13 NOTE — Progress Notes (Signed)
[facility] Note  08/14/2020     CHIEF COMPLAINT Patient presents for Retina Follow Up   HISTORY OF PRESENT ILLNESS: Brad Delacruz is a 59 y.o. male who presents to the clinic today for:   HPI    Retina Follow Up    Patient presents with  Diabetic Retinopathy.  In both eyes.  Severity is moderate.  Duration of 1.5 years.  Since onset it is gradually improving.  I, the attending physician,  performed the HPI with the patient and updated documentation appropriately.          Comments    Pt is here with his daughter and [facility] interpreter, daughter states he had a stroke in April 2021, she states is affected the left side of his body including speech and comprehension, pt states vision seems the same since last exam in March 2020, daughter states he is on medication for high blood pressure and insulin as well as a blood thinner       Last edited by Bernarda Caffey, MD on 08/14/2020  9:41 AM. 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 Madelin Headings for concern of NPDR OU LEE: 03.03.20 [name]) [BCVA: OU: 20/20-] Ocular Hx-NPDR OU PMH-DM (takes Farxiga/metformin)    CURRENT MEDICATIONS: No current outpatient medications on file. (Ophthalmic Drugs)   No current facility-administered medications for this visit. (Ophthalmic  Drugs)   Current Outpatient Medications (Other)  Medication Sig  . acetaminophen (TYLENOL) 325 MG tablet Take 2 tablets (650 mg total) by mouth every 6 (six) hours as needed for mild pain or moderate pain.  Marland Kitchen amantadine (SYMMETREL) 50 MG/5ML solution Take 10 mLs (100 mg total) by mouth 2 (two) times daily.  Marland Kitchen aspirin 81 MG chewable tablet Chew 1 tablet (81 mg total) by mouth daily.  Marland Kitchen atorvastatin (LIPITOR) 80 MG tablet Take 1 tablet (80 mg total) by mouth daily.  . blood glucose meter kit and supplies KIT Dispense based on patient and insurance preference. Use up to four times daily as directed. (FOR ICD-9 250.00, 250.01).  . insulin glargine (LANTUS) 100 UNIT/ML Solostar Pen Inject 34 Units into the skin 2 (two) times daily.  Marland Kitchen lidocaine (LIDODERM) 5 % Place 1 patch onto the skin daily. Remove & Discard patch within 12 hours or as directed by MD  . losartan (COZAAR) 25 MG tablet Take 0.5 tablets (12.5 mg total) by mouth daily.  . methocarbamol (ROBAXIN) 500 MG tablet Take 500 mg by mouth every 8 (eight) hours as needed.  Marland Kitchen PENTIPS 32G X 4 MM MISC See admin instructions.  . tamsulosin (FLOMAX) 0.4 MG CAPS capsule Take 1 capsule (0.4 mg total) by mouth daily after supper.  . ticagrelor (BRILINTA) 90 MG TABS tablet Take 1 tablet (90 mg total) by mouth 2 (two) times daily.  Marland Kitchen  traMADol (ULTRAM) 50 MG tablet Take 1 tablet (50 mg total) by mouth every 6 (six) hours as needed for severe pain.   No current facility-administered medications for this visit. (Other)      REVIEW OF SYSTEMS: ROS    Positive for: Endocrine, Cardiovascular, Eyes   Negative for: Constitutional, Gastrointestinal, Neurological, Skin, Genitourinary, Musculoskeletal, HENT, Respiratory, Psychiatric, Allergic/Imm, Heme/Lymph   Last edited by Debbrah Alar, COT on 08/14/2020  8:51 AM. (History)       ALLERGIES No Known Allergies  PAST MEDICAL HISTORY Past Medical History:  Diagnosis Date  . Diabetes mellitus without  complication ([place])   . Stroke [facility])    Past Surgical History:  Procedure Laterality Date  . IR CT HEAD LTD  03/24/2020  . IR CT HEAD LTD  03/24/2020  . IR INTRA CRAN STENT  03/24/2020  . IR PATIENT EVAL TECH 0-60 MINS  03/25/2020  . IR PERCUTANEOUS ART THROMBECTOMY/INFUSION INTRACRANIAL INC DIAG ANGIO  03/24/2020  . RADIOLOGY WITH ANESTHESIA N/A 03/24/2020   Procedure: IR WITH ANESTHESIA;  Surgeon: Luanne Bras, MD;  Location: [facility];  Service: Radiology;  Laterality: N/A;    FAMILY HISTORY Family History  Problem Relation Age of Onset  . Stroke Mother   . Hypertension Mother   . Heart attack Father   . Diabetes Father     SOCIAL HISTORY Social History   Tobacco Use  . Smoking status: Never Smoker  . Smokeless tobacco: Never Used  Vaping Use  . Vaping Use: Former  Substance Use Topics  . Alcohol use: Not Currently  . Drug use: Never         OPHTHALMIC EXAM:  Base Eye Exam    Visual Acuity (Snellen - Linear)   Unable to check VA due to pt being non-verbal       Tonometry (Tonopen, 8:55 AM)      Right Left   Pressure 15 29  Pt squeezing OS       Pupils      Dark Light Shape React APD   Right 3 3 Round Minimal None   Left 3 3 Round Minimal None       Visual Fields   Unable to check due to pt being non-verbal       Extraocular Movement      Right Left    Full, Nystagmus Full, Nystagmus  ?right sided neglect?       Neuro/Psych    Oriented x3: Yes   Mood/Affect: Normal        Slit Lamp and Fundus Exam    Slit Lamp Exam      Right Left   Lids/Lashes Dermatochalasis - upper lid Dermatochalasis - upper lid, mild Meibomian gland dysfunction   Conjunctiva/Sclera White and quiet mild nasal and temporal Pinguecula   Cornea 1+ Punctate epithelial erosions, Debris in tear film 1+ Punctate epithelial erosions, Debris in tear film   Anterior Chamber deep, narrow temporal angle deep and clear   Iris Round and well dilated, No NVI Round and well  dilated, No NVI   Lens 2+ Nuclear sclerosis, 2+ Cortical cataract 2+ Nuclear sclerosis, 2+ Cortical cataract   Vitreous Vitreous syneresis mild Vitreous syneresis       Fundus Exam      Right Left   Disc Pink and Sharp Mild Pallor greatest temporally, sharp rim   C/D Ratio 0.5 0.55   Macula Flat, good foveal reflex, scattered cystic changes/MA, mild CWS Good foveal reflex, scattered MA, focal  exudate, central cystic changes   Vessels Vascular attenuation, mild tortuousity Vascular attenuation, mild AV crossing changes   Periphery Attached, scattered MA, mild CWS about the disc Attached, rare MA          IMAGING AND PROCEDURES  Imaging and Procedures for _0 @  OCT, Retina - OU - Both Eyes       Right Eye Quality was good. Central Foveal Thickness: 307. Progression has been stable. Findings include normal foveal contour, no SRF, intraretinal fluid, intraretinal hyper-reflective material (Scattered Trace cystic changes ).   Left Eye Quality was good. Central Foveal Thickness: 348. Progression has worsened. Findings include no SRF, intraretinal fluid, intraretinal hyper-reflective material, abnormal foveal contour (Interval increase in IRF/cystic changes).   Notes *Images captured and stored on drive  Diagnosis / Impression:  Mild DME OU OD: NFP; Scattered Trace cystic changes -- stable OS: Interval increase in IRF/cystic changes  Clinical management:  See below  Abbreviations: NFP - Normal foveal profile. CME - cystoid macular edema. PED - pigment epithelial detachment. IRF - intraretinal fluid. SRF - subretinal fluid. EZ - ellipsoid zone. ERM - epiretinal membrane. ORA - outer retinal atrophy. ORT - outer retinal tubulation. SRHM - subretinal hyper-reflective material        Fluorescein Angiography Optos (Transit OS)       Right Eye   Progression has no prior data. Early phase findings include microaneurysm. Mid/Late phase findings include microaneurysm, leakage.    Left Eye   Progression has no prior data. Early phase findings include microaneurysm. Mid/Late phase findings include microaneurysm, leakage.   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 Moderate nonproliferative diabetic retinopathy of both eyes with macular edema associated with type 2 diabetes mellitus ([place])  L97.6734   2. Retinal edema  H35.81 OCT, Retina - OU - Both Eyes  3. Acute ischemic stroke (HCC) L MCA d/t L M1 oclusion s/p MCA stent  I63.9   4. Essential hypertension  I10   5. Hypertensive retinopathy of both eyes  H35.033 Fluorescein Angiography Optos (Transit OS)  6. Combined forms of age-related cataract of both eyes  H25.813     1,2. Moderate nonproliferative diabetic retinopathy with DME OU  - The incidence, risk factors for progression, natural history and treatment options for diabetic retinopathy were discussed with patient.    - The need for close monitoring of blood glucose, blood pressure, and serum lipids, avoiding cigarette or any type of tobacco, and the need for long term follow up was also discussed with patient.  - BCVA - unable to determine today due to pt being non-verbal  - exam shows scattered IRH  - OCT with noncentral diabetic macular edema, OU -- OS slightly increased from prior, but visual potential remains pretty good from a retina standpoint  - FA 9.10.21 shows late leaking MA, no NV OU  - discussed findings, prognosis and possible treatments  - recommend holding off on any therapy at this point and will monitor for any significant changes in diabetic disease  - pt and family in agreement with plan  - f/u in 2-3 months, DFE/OCT  3. History of stroke  - with pt's nonverbal status, very difficult to assess level of vision and to parse out how much of his decline is related to diabetic  retinopathy vs stroke  - suspect his  stroke is driving majority of visual impairment as his diabetic eye disease remains relatively mild  - mild pallor of disc OS  - will monitor  4,5. Hypertensive retinopathy OU  - discussed importance of tight BP control  - monitor  6. Mixed form age related cataract  - The symptoms of cataract, surgical options, and treatments and risks were discussed with patient.  - discussed diagnosis and progression  - not yet visually significant  - monitor for now   Ophthalmic Meds Ordered this visit:  No orders of the defined types were placed in this encounter.      Return for f/u 2-3 months, NPDR OU.  There are no Patient Instructions on file for this visit.   Explained the diagnoses, plan, and follow up with the patient and they expressed understanding.  Patient expressed understanding of the importance of proper follow up care.   This document serves as a record of services personally performed by Gardiner Sleeper, MD, PhD. It was created on their behalf by San Jetty. Owens Shark, OA an ophthalmic technician. The creation of this record is the provider's dictation and/or activities during the visit.    Electronically signed by: San Jetty. Owens Shark, [place] 09.09.2021 12:53 PM  Gardiner Sleeper, M.D., Ph.D. Diseases & Surgery of the Retina and Vitreous [facility]  I have reviewed the above documentation for accuracy and completeness, and I agree with the above. Gardiner Sleeper, M.D., Ph.D. 08/14/20 12:53 PM   Abbreviations: M myopia (nearsighted); A astigmatism; H hyperopia (farsighted); P presbyopia; Mrx spectacle prescription;  CTL contact lenses; OD right eye; OS left eye; OU both eyes  XT exotropia; ET esotropia; PEK punctate epithelial keratitis; PEE punctate epithelial erosions; DES dry eye syndrome; MGD meibomian gland dysfunction; ATs artificial tears; PFAT's preservative free artificial tears; [place] nuclear sclerotic cataract; PSC posterior subcapsular cataract; ERM  epi-retinal membrane; PVD posterior vitreous detachment; RD retinal detachment; DM diabetes mellitus; DR diabetic retinopathy; NPDR non-proliferative diabetic retinopathy; PDR proliferative diabetic retinopathy; CSME clinically significant macular edema; DME diabetic macular edema; dbh dot blot hemorrhages; CWS cotton wool spot; POAG primary open angle glaucoma; C/D cup-to-disc ratio; HVF humphrey visual field; GVF goldmann visual field; OCT optical coherence tomography; IOP intraocular pressure; BRVO Branch retinal vein occlusion; CRVO central retinal vein occlusion; CRAO central retinal artery occlusion; BRAO branch retinal artery occlusion; RT retinal tear; SB scleral buckle; PPV pars plana vitrectomy; VH Vitreous hemorrhage; PRP panretinal laser photocoagulation; IVK intravitreal kenalog; VMT vitreomacular traction; MH Macular hole;  NVD neovascularization of the disc; NVE neovascularization elsewhere; AREDS age related eye disease study; ARMD age related macular degeneration; POAG primary open angle glaucoma; EBMD epithelial/anterior basement membrane dystrophy; ACIOL anterior chamber intraocular lens; IOL intraocular lens; PCIOL posterior chamber intraocular lens; Phaco/IOL phacoemulsification with intraocular lens placement; [place] photorefractive keratectomy; LASIK laser assisted in situ keratomileusis; HTN hypertension; DM diabetes mellitus; COPD chronic obstructive pulmonary disease

## 2020-08-14 ENCOUNTER — Encounter (INDEPENDENT_AMBULATORY_CARE_PROVIDER_SITE_OTHER): Payer: Self-pay | Admitting: Ophthalmology

## 2020-08-14 ENCOUNTER — Ambulatory Visit (INDEPENDENT_AMBULATORY_CARE_PROVIDER_SITE_OTHER): Payer: BC Managed Care – PPO | Admitting: Ophthalmology

## 2020-08-14 ENCOUNTER — Other Ambulatory Visit: Payer: Self-pay

## 2020-08-14 DIAGNOSIS — H3581 Retinal edema: Secondary | ICD-10-CM | POA: Diagnosis not present

## 2020-08-14 DIAGNOSIS — I639 Cerebral infarction, unspecified: Secondary | ICD-10-CM | POA: Diagnosis not present

## 2020-08-14 DIAGNOSIS — H35033 Hypertensive retinopathy, bilateral: Secondary | ICD-10-CM

## 2020-08-14 DIAGNOSIS — H25813 Combined forms of age-related cataract, bilateral: Secondary | ICD-10-CM

## 2020-08-14 DIAGNOSIS — E113313 Type 2 diabetes mellitus with moderate nonproliferative diabetic retinopathy with macular edema, bilateral: Secondary | ICD-10-CM | POA: Diagnosis not present

## 2020-08-14 DIAGNOSIS — I10 Essential (primary) hypertension: Secondary | ICD-10-CM

## 2020-08-20 ENCOUNTER — Telehealth (HOSPITAL_COMMUNITY): Payer: Self-pay

## 2020-08-20 NOTE — Telephone Encounter (Signed)
L/m using interpreter line patient needs one apptment  to pick up his shoulder/arm support and to insure proper fit of shoulder support item.

## 2020-09-11 DIAGNOSIS — I63512 Cerebral infarction due to unspecified occlusion or stenosis of left middle cerebral artery: Secondary | ICD-10-CM | POA: Diagnosis not present

## 2020-09-12 DIAGNOSIS — I63512 Cerebral infarction due to unspecified occlusion or stenosis of left middle cerebral artery: Secondary | ICD-10-CM | POA: Diagnosis not present

## 2020-09-15 ENCOUNTER — Telehealth (HOSPITAL_COMMUNITY): Payer: Self-pay

## 2020-09-15 NOTE — Telephone Encounter (Signed)
Brad Delacruz called back and stated they did not get any messages on her father's phone. I confirmed that a Interpreter called for me on Sept and l/m. Brad Delacruz stated that is useless do not do that please call yourself. Her litter sister is Bi-Langual. She will tell her father to get in touch with [name].

## 2020-09-15 NOTE — Telephone Encounter (Signed)
L/m called his daughter Verlee Monte just now and l/m requesting that she tell he father to call us to make an appointment to pick up his brace and and sling.

## 2020-09-18 DIAGNOSIS — J302 Other seasonal allergic rhinitis: Secondary | ICD-10-CM | POA: Diagnosis not present

## 2020-09-18 DIAGNOSIS — R059 Cough, unspecified: Secondary | ICD-10-CM | POA: Diagnosis not present

## 2020-09-18 DIAGNOSIS — J069 Acute upper respiratory infection, unspecified: Secondary | ICD-10-CM | POA: Diagnosis not present

## 2020-10-12 DIAGNOSIS — I63512 Cerebral infarction due to unspecified occlusion or stenosis of left middle cerebral artery: Secondary | ICD-10-CM | POA: Diagnosis not present

## 2020-10-13 ENCOUNTER — Ambulatory Visit (HOSPITAL_COMMUNITY): Payer: BC Managed Care – PPO | Attending: Physician Assistant

## 2020-10-13 ENCOUNTER — Other Ambulatory Visit: Payer: Self-pay

## 2020-10-13 DIAGNOSIS — I63512 Cerebral infarction due to unspecified occlusion or stenosis of left middle cerebral artery: Secondary | ICD-10-CM | POA: Diagnosis not present

## 2020-10-13 DIAGNOSIS — M25511 Pain in right shoulder: Secondary | ICD-10-CM | POA: Insufficient documentation

## 2020-10-13 NOTE — Therapy (Signed)
[facility] [address] [place], [place], [address] Phone: [phone]   Fax:  [phone]  Patient Details  Name: Brad Delacruz MRN: [id number] Date of Birth: [birth year] Referring Provider:  Benita Stabile, MD  Encounter Date: 10/13/2020 No charge visit for DME fitting. Patient, patient's wife and daughter present during visit. Patient was provided with Arm escort and OmoTrain shoulder support for RUE. Reviewed care management, donning/doffing technique, precautions, and use. Patient, daughter, and wife verbalized understanding with no additional questions. Patient was encouraged to call if any questions arise.    Limmie Patricia, OTR/L,CBIS  [phone]  10/13/2020, 5:46 PM  [facility] [address] [place], [place], [address] Phone: [phone]   Fax:  [phone]

## 2020-11-10 ENCOUNTER — Encounter: Payer: BC Managed Care – PPO | Attending: Registered Nurse | Admitting: Physical Medicine & Rehabilitation

## 2020-11-10 ENCOUNTER — Encounter: Payer: Self-pay | Admitting: Physical Medicine & Rehabilitation

## 2020-11-10 ENCOUNTER — Other Ambulatory Visit: Payer: Self-pay

## 2020-11-10 VITALS — BP 134/71 | HR 68 | Temp 97.9°F | Ht 65.0 in | Wt 176.0 lb

## 2020-11-10 DIAGNOSIS — I69351 Hemiplegia and hemiparesis following cerebral infarction affecting right dominant side: Secondary | ICD-10-CM

## 2020-11-10 DIAGNOSIS — I69390 Apraxia following cerebral infarction: Secondary | ICD-10-CM

## 2020-11-10 DIAGNOSIS — I69320 Aphasia following cerebral infarction: Secondary | ICD-10-CM

## 2020-11-10 NOTE — Patient Instructions (Signed)
Prevencin del accidente cerebrovascular Stroke Prevention Algunas afecciones mdicas y elecciones en el estilo de vida pueden conducir a un mayor riesgo de tener un accidente cerebrovascular. Realizando cambios, por ejemplo, en la alimentacin y en el estilo de vida, se puede ayudar a prevenir un accidente cerebrovascular. Qu cambios en la alimentacin se pueden realizar?   Consuma alimentos saludables. ? Elija alimentos que sean ricos en fibra. Estos incluyen los siguientes:  Frutas frescas.  Verduras frescas.  Cereales integrales. ? Coma 5 o ms porciones de frutas y verduras cada da. Trate de que la mitad del plato de cada comida sea de frutas y verduras. ? Elija alimentos con protenas magras. Estos incluyen los siguientes:  Cortes de carne con poca grasa (magros).  Pollo sin piel.  Pescado.  Tofu.  Frijoles.  Frutos secos. ? Consuma productos lcteos descremados. ? Evite alimentos que:  Tengan un alto contenido de sal (sodio).  Tengan grasas saturadas.  Tengan grasas trans.  Tengan colesterol.  Sean procesados.  Sean precocinados.  Siga las recomendaciones nutricionales que le d el mdico. Estos tratamientos pueden incluir: ? Reducir la cantidad de caloras que ingiere cada da. ? Limitar la cantidad de sal que ingiere cada da a 1,500miligramos (mg). ? Usar solo grasas saludables para cocinar. Estos incluyen los siguientes:  Aceite de oliva.  Aceite de canola.  Aceite de girasol. ? Contar cuntos carbohidratos ingiere cada da. Qu cambios en el estilo de vida se pueden realizar?  Intente mantener un peso saludable. Consulte a su mdico para saber cul debera ser su peso adecuado.  Haga por lo menos 30minutos de actividad fsica moderada 5 das por semana. Esto puede incluir lo siguiente: ? Caminar rpidamente. ? Andar en bicicleta. ? Natacin.  No consuma ningn producto que contenga nicotina o tabaco. Esto incluye cigarrillos y  cigarrillos electrnicos. Si necesita ayuda para dejar de fumar, consulte al mdico. Evite estar en lugares donde hay humo de tabaco.  Limite cunto alcohol bebe a no ms de 1medida por da si es mujer y no est embarazada, y 2medidas por da si es hombre. Una medida equivale a 12oz de cerveza, 5oz de vino o 1oz de bebidas alcohlicas de alta graduacin.  No consuma drogas.  Evite tomar pldoras anticonceptivas. Hable con su mdico acerca de los riesgos de tomar pldoras anticonceptivas si: ? Es mayor de 35aos. ? Fuma. ? Tiene migraas. ? Alguna vez ha tenido un cogulo de sangre. Qu otros cambios se pueden realizar?  Controle su colesterol. ? Es importante que siga una dieta saludable. ? Si el colesterol no puede controlarse con la dieta, es posible que tambin deba tomar medicamentos. Tome los medicamentos como le indic el mdico.  Controle su diabetes. ? Es importante que siga una dieta saludable y haga ejercicio con regularidad. ? Si su nivel de azcar en la sangre no puede controlarse mediante dieta y ejercicio, es posible que deba tomar medicamentos. Tome los medicamentos como le indic el mdico.  Controle la presin arterial alta (hipertensin). ? Intente mantener su presin arterial por debajo de 130/80. Esto puede ayudarle a disminuir su riesgo de accidente cerebrovascular. ? Es importante que siga una dieta saludable y haga ejercicio con regularidad. ? Si su presin arterial no puede controlarse mediante dieta y ejercicio, es posible que deba tomar medicamentos. Tome los medicamentos como le indic el mdico. ? Pregntele al mdico si debera controlar su presin arterial en su casa. ? Hgase controlar la presin arterial todos los aos. Hgalo aunque su presin   arterial sea normal.  Consulte a su mdico sobre la necesidad de una evaluacin para determinar si tiene un trastorno del sueo. Algunos signos pueden ser: ? Roncar mucho. ? Mucho cansancio.  Use los  medicamentos de venta libre y los recetados solamente como se lo haya indicado el mdico. Estos pueden incluir aspirina o medicamentos que licuan la sangre (antiagregantes plaquetarios o anticoagulantes).  Asegrese de tener controlada cualquier otra afeccin mdica que tenga. Dnde buscar ms informacin  American Stroke Association (Asociacin Americana de Accidente Cerebrovascular): www.strokeassociation.org  National Stroke Association (Asociacin Nacional de Accidente Cerebrovascular): www.stroke.org Solicite ayuda inmediatamente si:  Tiene sntomas de un accidente cerebrovascular. "BE FAST" es una manera fcil de recordar las principales seales de advertencia: ? B - Balance (equilibrio). Los signos son mareos, dificultad repentina para caminar o prdida del equilibrio. ? E - Eyes (ojos). Los signos son dificultad para ver o un cambio repentino en la visin. ? F - Face (rostro). Los signos son debilidad repentina o prdida de sensacin en el rostro, o el rostro o el prpado que se caen hacia un lado. ? A - Arms (brazos). Los signos son debilidad o prdida de la sensibilidad en un brazo. Esto sucede de repente y generalmente en un lado del cuerpo. ? S - Speech (habla). Los signos son dificultad para hablar, hablar arrastrando las palabras o dificultad para comprender lo que la gente dice. ? T - Time (tiempo). Es tiempo de llamar al servicio de emergencias. Anote la hora a la que comenzaron los sntomas.  Presenta otros signos de accidente cerebrovascular, como los siguientes: ? Dolor de cabeza repentino y muy intenso sin causa aparente. ? Malestar estomacal (nuseas). ? Vmitos. ? Movimientos espasmdicos que no puede controlar (convulsiones). Estos sntomas pueden representar un problema grave que constituye una emergencia. No espere a ver si los sntomas desaparecen. Solicite atencin mdica de inmediato. Comunquese con el servicio de emergencias de su localidad (911 en los [place]). No conduzca por sus propios medios hasta el hospital. Resumen  Un accidente cerebrovascular se puede prevenir con una alimentacin saludable, haciendo ejercicio, no fumando, bebiendo menos alcohol y recibiendo tratamiento para otros problemas de salud, como la diabetes, la presin arterial alta o el colesterol alto.  No consuma ningn producto que contenga nicotina o tabaco, como cigarrillos y cigarrillos electrnicos.  Obtenga ayuda de inmediato si tiene signos o sntomas de un accidente cerebrovascular. Esta informacin no tiene como fin reemplazar el consejo del mdico. Asegrese de hacerle al mdico cualquier pregunta que tenga. Document Revised: 02/14/2019 Document Reviewed: 02/14/2019 Elsevier Patient Education  2020 Elsevier Inc.  

## 2020-11-10 NOTE — Progress Notes (Signed)
Subjective:    Patient ID: Brad Delacruz, male    DOB: September 12, 1961, 59 y.o.   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 Interpreter in room with patient and daughter however the daughter is able to interpret. No new issues at home.  Patient continues require some assistance for transfers as well as dressing and bathing.  He was able to stand holding with left arm onto handrail for his weight today.  His speech is limited.  His daughter feels like he can understand some things when she speaks to him. Pain Inventory Average Pain 2 Pain Right Now 1 My pain is aching  LOCATION OF PAIN  shoulder  BOWEL Number of stools per week: 1-2 Oral laxative use No  Type of laxative na Enema or suppository use No  History of colostomy No  Incontinent No   BLADDER Normal In and out cath, frequency na Able to self cath na Bladder incontinence No  Frequent urination No  Leakage with coughing No  Difficulty starting stream No  Incomplete bladder emptying No    Mobility ability to climb steps?  no do you drive?  no use a wheelchair needs help with transfers  Function disabled: date disabled . I need assistance with the following:  bathing, toileting, meal prep, household duties and shopping  Neuro/Psych trouble walking  Prior Studies Any changes since last visit?  no  Physicians involved in your care Any changes since last visit?  no   Family History  Problem Relation Age of Onset  . Stroke Mother   . Hypertension Mother   . Heart attack Father   . Diabetes Father    Social History   Socioeconomic History  . Marital status: Married    Spouse name: Not on file  . Number of children: 5  . Years of education: Not on file  . Highest education level: Not on file  Occupational History  . Not on file  Tobacco Use  . Smoking status: Never Smoker  .  Smokeless tobacco: Never Used  Vaping Use  . Vaping Use: Former  Substance and Sexual Activity  . Alcohol use: Not Currently  . Drug use: Never  . Sexual activity: Not on file  Other Topics Concern  . Not on file  Social History Narrative   Lives with wife, and 1 daughter   Social Determinants of Health   Financial Resource Strain:   . Difficulty of Paying Living Expenses: Not on file  Food Insecurity:   . Worried About Programme researcher, broadcasting/film/video in the Last Year: Not on file  . Ran Out of Food in the Last Year: Not on file  Transportation Needs:   . Lack of Transportation (Medical): Not on file  . Lack of Transportation (Non-Medical): Not on file  Physical Activity:   . Days of Exercise per Week: Not on file  . Minutes of Exercise per Session: Not on file  Stress:   . Feeling of Stress : Not on file  Social Connections:   . Frequency of Communication with Friends and Family: Not on file  . Frequency of Social Gatherings with Friends and Family: Not on file  . Attends Religious Services: Not on file  . Active Member of Clubs or Organizations: Not on file  . Attends Banker Meetings: Not on file  . Marital Status: Not on file   Past Surgical History:  Procedure Laterality Date  . IR CT HEAD LTD  03/24/2020  . IR CT HEAD LTD  03/24/2020  . IR INTRA CRAN STENT  03/24/2020  . IR PATIENT EVAL TECH 0-60 MINS  03/25/2020  . IR PERCUTANEOUS ART THROMBECTOMY/INFUSION INTRACRANIAL INC DIAG ANGIO  03/24/2020  . RADIOLOGY WITH ANESTHESIA N/A 03/24/2020   Procedure: IR WITH ANESTHESIA;  Surgeon: Julieanne Cotton, MD;  Location: [facility] OR;  Service: Radiology;  Laterality: N/A;   Past Medical History:  Diagnosis Date  . Diabetes mellitus without complication (HCC)   . Stroke (HCC)    BP 134/71   Pulse 68   Temp 97.9 F (36.6 C)   Ht 5\' 5"  (1.651 m)   Wt 176 lb (79.8 kg)   SpO2 95%   BMI 29.29 kg/m   Opioid Risk Score:   Fall Risk Score:  `1  Depression screen PHQ  2/9  Depression screen [facility] 2/9 08/11/2020 05/25/2020  Decreased Interest 1 0  Down, Depressed, Hopeless 1 0  PHQ - 2 Score 2 0  Altered sleeping - 0  Tired, decreased energy - 0  Change in appetite - 0  Feeling bad or failure about yourself  - 0  Trouble concentrating - 1  Moving slowly or fidgety/restless - 0  Suicidal thoughts - 0  PHQ-9 Score - 1    Review of Systems     Objective:   Physical Exam Vitals and nursing note reviewed.  Constitutional:      Appearance: He is obese.  HENT:     Head: Normocephalic and atraumatic.  Eyes:     Extraocular Movements: Extraocular movements intact.     Conjunctiva/sclera: Conjunctivae normal.     Pupils: Pupils are equal, round, and reactive to light.  Musculoskeletal:     Comments: No pain with shoulder range of motion on the right side no pain with elbow wrist range of motion no evidence of finger or wrist swelling.  Neurological:     Mental Status: He is alert.     Comments: Cannot assess orientation due to aphasia. He is unable to name simple objects such as pen.  He has I am able to follow simple commands such as touch  Nose  Motor strength is 0/5 in the right deltoid bicep tricep grip, trace hip knee extensor synergy otherwise 0 in the right lower extremity Right upper extremity tone is MAS 1 in the finger and wrist flexors as well as the thumb flexor. Right lower extremity tone MAS 0 in the hamstrings quads ankle plantar flexors.  Unable to assess sensation secondary to aphasia   Psychiatric:        Mood and Affect: Mood normal.           Assessment & Plan:  #1.  Right spastic hemiplegia secondary left MCA distribution infarct.  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 His spasticity is relatively mild and can be managed with range of motion as discussed with patient's daughter In terms of aphasia we would expect plateau at around 1  year post stroke which would be in April. I will see him back at that point. Patient's daughter is to call if his tone increases so we can evaluate for botulinum toxin injection.

## 2020-11-11 DIAGNOSIS — I63512 Cerebral infarction due to unspecified occlusion or stenosis of left middle cerebral artery: Secondary | ICD-10-CM | POA: Diagnosis not present

## 2020-11-11 NOTE — Progress Notes (Shared)
[facility] Note  11/13/2020     CHIEF COMPLAINT Patient presents for No chief complaint on file.   HISTORY OF PRESENT ILLNESS: Brad Delacruz is a 59 y.o. male who presents to the clinic today for:   pt states he was sent here by Dr. Jorja Loa for DM exam who he saw for routine eye exam, pt states he does not have any problems with his vision, he states he only wears reading glasses, pt states he takes metformin and farxiga for diabetes, he states his last A1C was over 9, and he does not check his blood sugar at home, pt states he also takes medication for high BP, pt denies any other health problems  Referring physician: Celene Squibb, MD [address] [place],  [place] [address]  HISTORICAL INFORMATION:   Selected notes from the MEDICAL RECORD NUMBER Referred by Dr. Madelin Headings for concern of NPDR OU LEE: 03.03.20 [name]) [BCVA: OU: 20/20-] Ocular Hx-NPDR OU PMH-DM (takes Farxiga/metformin)    CURRENT MEDICATIONS: No current outpatient medications on file. (Ophthalmic Drugs)   No current facility-administered medications for this visit. (Ophthalmic Drugs)   Current Outpatient Medications (Other)  Medication Sig  . acetaminophen (TYLENOL) 325 MG tablet Take 2 tablets (650 mg total) by mouth every 6 (six) hours as needed for mild pain or moderate pain.  Marland Kitchen amantadine (SYMMETREL) 50 MG/5ML solution Take 10 mLs (100 mg total) by mouth 2 (two) times daily.  Marland Kitchen aspirin 81 MG chewable tablet Chew 1 tablet (81 mg total) by mouth daily.  Marland Kitchen atorvastatin (LIPITOR) 80 MG tablet Take 1 tablet (80 mg total) by mouth daily.  . blood glucose meter kit and supplies KIT Dispense based on patient and insurance preference. Use up to four times daily as directed. (FOR ICD-9 250.00, 250.01).  . insulin glargine (LANTUS) 100 UNIT/ML Solostar Pen Inject 34 Units into the skin 2 (two) times daily.  Marland Kitchen lidocaine (LIDODERM) 5 % Place 1 patch onto the skin daily. Remove  & Discard patch within 12 hours or as directed by MD  . losartan (COZAAR) 25 MG tablet Take 0.5 tablets (12.5 mg total) by mouth daily.  . methocarbamol (ROBAXIN) 500 MG tablet Take 500 mg by mouth every 8 (eight) hours as needed.  Marland Kitchen PENTIPS 32G X 4 MM MISC See admin instructions.  . tamsulosin (FLOMAX) 0.4 MG CAPS capsule Take 1 capsule (0.4 mg total) by mouth daily after supper.  . ticagrelor (BRILINTA) 90 MG TABS tablet Take 1 tablet (90 mg total) by mouth 2 (two) times daily.  . traMADol (ULTRAM) 50 MG tablet Take 1 tablet (50 mg total) by mouth every 6 (six) hours as needed for severe pain.   No current facility-administered medications for this visit. (Other)      REVIEW OF SYSTEMS:    ALLERGIES No Known Allergies  PAST MEDICAL HISTORY Past Medical History:  Diagnosis Date  . Diabetes mellitus without complication ([place])   . Stroke [facility])    Past Surgical History:  Procedure Laterality Date  . IR CT HEAD LTD  03/24/2020  . IR CT HEAD LTD  03/24/2020  . IR INTRA CRAN STENT  03/24/2020  . IR PATIENT EVAL TECH 0-60 MINS  03/25/2020  . IR PERCUTANEOUS ART THROMBECTOMY/INFUSION INTRACRANIAL INC DIAG ANGIO  03/24/2020  . RADIOLOGY WITH ANESTHESIA N/A 03/24/2020   Procedure: IR WITH ANESTHESIA;  Surgeon: Luanne Bras, MD;  Location: [facility];  Service: Radiology;  Laterality: N/A;  FAMILY HISTORY Family History  Problem Relation Age of Onset  . Stroke Mother   . Hypertension Mother   . Heart attack Father   . Diabetes Father     SOCIAL HISTORY Social History   Tobacco Use  . Smoking status: Never Smoker  . Smokeless tobacco: Never Used  Vaping Use  . Vaping Use: Former  Substance Use Topics  . Alcohol use: Not Currently  . Drug use: Never         OPHTHALMIC EXAM:  Not recorded     IMAGING AND PROCEDURES  Imaging and Procedures for @TODAY @           ASSESSMENT/PLAN:    ICD-10-CM   1. Moderate nonproliferative diabetic retinopathy of both eyes  with macular edema associated with type 2 diabetes mellitus ([place])  P37.9024   2. Retinal edema  H35.81 OCT, Retina - OU - Both Eyes  3. Acute ischemic stroke (HCC) L MCA d/t L M1 oclusion s/p MCA stent  I63.9   4. Essential hypertension  I10   5. Hypertensive retinopathy of both eyes  H35.033   6. Combined forms of age-related cataract of both eyes  H25.813     1,2. Moderate nonproliferative diabetic retinopathy with DME OU  - The incidence, risk factors for progression, natural history and treatment options for diabetic retinopathy were discussed with patient.    - The need for close monitoring of blood glucose, blood pressure, and serum lipids, avoiding cigarette or any type of tobacco, and the need for long term follow up was also discussed with patient.  - BCVA 20/20 OU  - exam shows scattered IRH  - OCT with noncentral diabetic macular edema, OU   - f/u in 6 weeks, DFE/OCT/Optos FA  3,4. Hypertensive retinopathy OU  - discussed importance of tight BP control  - monitor  5. Mixed form age related cataract  - The symptoms of cataract, surgical options, and treatments and risks were discussed with patient.  - discussed diagnosis and progression  - not yet visually significant  - monitor for now   Ophthalmic Meds Ordered this visit:  No orders of the defined types were placed in this encounter.      No follow-ups on file.  There are no Patient Instructions on file for this visit.   Explained the diagnoses, plan, and follow up with the patient and they expressed understanding.  Patient expressed understanding of the importance of proper follow up care.   This document serves as a record of services personally performed by Gardiner Sleeper, MD, PhD. It was created on their behalf by San Jetty. Owens Shark, OA an ophthalmic technician. The creation of this record is the provider's dictation and/or activities during the visit.    Electronically signed by: San Jetty. Owens Shark, [place] 12.08.2021  8:20 AM   Gardiner Sleeper, M.D., Ph.D. Diseases & Surgery of the Retina and Vitreous [facility]: M myopia (nearsighted); A astigmatism; H hyperopia (farsighted); P presbyopia; Mrx spectacle prescription;  CTL contact lenses; OD right eye; OS left eye; OU both eyes  XT exotropia; ET esotropia; PEK punctate epithelial keratitis; PEE punctate epithelial erosions; DES dry eye syndrome; MGD meibomian gland dysfunction; ATs artificial tears; PFAT's preservative free artificial tears; [place] nuclear sclerotic cataract; PSC posterior subcapsular cataract; ERM epi-retinal membrane; PVD posterior vitreous detachment; RD retinal detachment; DM diabetes mellitus; DR diabetic retinopathy; NPDR non-proliferative diabetic retinopathy; PDR proliferative diabetic retinopathy; CSME clinically significant  macular edema; DME diabetic macular edema; dbh dot blot hemorrhages; CWS cotton wool spot; POAG primary open angle glaucoma; C/D cup-to-disc ratio; HVF humphrey visual field; GVF goldmann visual field; OCT optical coherence tomography; IOP intraocular pressure; BRVO Branch retinal vein occlusion; CRVO central retinal vein occlusion; CRAO central retinal artery occlusion; BRAO branch retinal artery occlusion; RT retinal tear; SB scleral buckle; PPV pars plana vitrectomy; VH Vitreous hemorrhage; PRP panretinal laser photocoagulation; IVK intravitreal kenalog; VMT vitreomacular traction; MH Macular hole;  NVD neovascularization of the disc; NVE neovascularization elsewhere; AREDS age related eye disease study; ARMD age related macular degeneration; POAG primary open angle glaucoma; EBMD epithelial/anterior basement membrane dystrophy; ACIOL anterior chamber intraocular lens; IOL intraocular lens; PCIOL posterior chamber intraocular lens; Phaco/IOL phacoemulsification with intraocular lens placement; [place] photorefractive keratectomy; LASIK laser assisted in situ keratomileusis; HTN  hypertension; DM diabetes mellitus; COPD chronic obstructive pulmonary disease

## 2020-11-12 DIAGNOSIS — I63512 Cerebral infarction due to unspecified occlusion or stenosis of left middle cerebral artery: Secondary | ICD-10-CM | POA: Diagnosis not present

## 2020-11-13 ENCOUNTER — Encounter (INDEPENDENT_AMBULATORY_CARE_PROVIDER_SITE_OTHER): Payer: Self-pay | Admitting: Ophthalmology

## 2020-11-13 ENCOUNTER — Encounter (INDEPENDENT_AMBULATORY_CARE_PROVIDER_SITE_OTHER): Payer: BC Managed Care – PPO | Admitting: Ophthalmology

## 2020-11-13 ENCOUNTER — Ambulatory Visit (INDEPENDENT_AMBULATORY_CARE_PROVIDER_SITE_OTHER): Payer: BC Managed Care – PPO | Admitting: Ophthalmology

## 2020-11-13 ENCOUNTER — Other Ambulatory Visit: Payer: Self-pay

## 2020-11-13 DIAGNOSIS — H35033 Hypertensive retinopathy, bilateral: Secondary | ICD-10-CM

## 2020-11-13 DIAGNOSIS — I10 Essential (primary) hypertension: Secondary | ICD-10-CM | POA: Diagnosis not present

## 2020-11-13 DIAGNOSIS — H3581 Retinal edema: Secondary | ICD-10-CM | POA: Diagnosis not present

## 2020-11-13 DIAGNOSIS — H25813 Combined forms of age-related cataract, bilateral: Secondary | ICD-10-CM

## 2020-11-13 DIAGNOSIS — E113313 Type 2 diabetes mellitus with moderate nonproliferative diabetic retinopathy with macular edema, bilateral: Secondary | ICD-10-CM | POA: Diagnosis not present

## 2020-11-13 DIAGNOSIS — I639 Cerebral infarction, unspecified: Secondary | ICD-10-CM

## 2020-11-13 NOTE — Progress Notes (Signed)
[facility] Note  11/13/2020     CHIEF COMPLAINT Patient presents for Retina Follow Up   HISTORY OF PRESENT ILLNESS: [name] is a 59 y.o. male who presents to the clinic today for:   HPI    Retina Follow Up    Patient presents with  Diabetic Retinopathy.  In both eyes.  This started 21 months ago.  I, the attending physician,  performed the HPI with the patient and updated documentation appropriately.          Comments    Patient here for retina follow up for NPDR OU last seen 3.11.20. Patient through interpreter states vision seems good. No headaches around eye or eye pain.        Last edited by Bernarda Caffey, MD on 11/13/2020  1:32 PM. (History)    Pt is not having any decrease in vision  Referring physician: Celene Squibb, MD [place],  [place] [address]  HISTORICAL INFORMATION:   Selected notes from the MEDICAL RECORD NUMBER Referred by Dr. Madelin Headings for concern of NPDR OU LEE: 03.03.20 Brad Delacruz) [BCVA: OU: 20/20-] Ocular Hx-NPDR OU PMH-DM (takes Farxiga/metformin)    CURRENT MEDICATIONS: No current outpatient medications on file. (Ophthalmic Drugs)   No current facility-administered medications for this visit. (Ophthalmic Drugs)   Current Outpatient Medications (Other)  Medication Sig   acetaminophen (TYLENOL) 325 MG tablet Take 2 tablets (650 mg total) by mouth every 6 (six) hours as needed for mild pain or moderate pain.   amantadine (SYMMETREL) 50 MG/5ML solution Take 10 mLs (100 mg total) by mouth 2 (two) times daily.   aspirin 81 MG chewable tablet Chew 1 tablet (81 mg total) by mouth daily.   atorvastatin (LIPITOR) 80 MG tablet Take 1 tablet (80 mg total) by mouth daily.   blood glucose meter kit and supplies KIT Dispense based on patient and insurance preference. Use up to four times daily as directed. (FOR ICD-9 250.00, 250.01).   insulin glargine (LANTUS) 100 UNIT/ML Solostar Pen Inject 34  Units into the skin 2 (two) times daily.   lidocaine (LIDODERM) 5 % Place 1 patch onto the skin daily. Remove & Discard patch within 12 hours or as directed by MD   losartan (COZAAR) 25 MG tablet Take 0.5 tablets (12.5 mg total) by mouth daily.   methocarbamol (ROBAXIN) 500 MG tablet Take 500 mg by mouth every 8 (eight) hours as needed.   PENTIPS 32G X 4 MM MISC See admin instructions.   tamsulosin (FLOMAX) 0.4 MG CAPS capsule Take 1 capsule (0.4 mg total) by mouth daily after supper.   ticagrelor (BRILINTA) 90 MG TABS tablet Take 1 tablet (90 mg total) by mouth 2 (two) times daily.   traMADol (ULTRAM) 50 MG tablet Take 1 tablet (50 mg total) by mouth every 6 (six) hours as needed for severe pain.   No current facility-administered medications for this visit. (Other)      REVIEW OF SYSTEMS: ROS    Positive for: Endocrine, Cardiovascular, Eyes   Negative for: Constitutional, Gastrointestinal, Neurological, Skin, Genitourinary, Musculoskeletal, HENT, Respiratory, Psychiatric, Allergic/Imm, Heme/Lymph   Last edited by [name], COA on 11/13/2020  1:28 PM. (History)       ALLERGIES No Known Allergies  PAST MEDICAL HISTORY Past Medical History:  Diagnosis Date   Diabetes mellitus without complication ([place])    Stroke [facility])    Past Surgical History:  Procedure Laterality Date  IR CT HEAD LTD  03/24/2020   IR CT HEAD LTD  03/24/2020   IR INTRA CRAN STENT  03/24/2020   IR PATIENT EVAL TECH 0-60 MINS  03/25/2020   IR PERCUTANEOUS ART THROMBECTOMY/INFUSION INTRACRANIAL INC DIAG ANGIO  03/24/2020   RADIOLOGY WITH ANESTHESIA N/A 03/24/2020   Procedure: IR WITH ANESTHESIA;  Surgeon: Luanne Bras, MD;  Location: [facility];  Service: Radiology;  Laterality: N/A;    FAMILY HISTORY Family History  Problem Relation Age of Onset   Stroke Mother    Hypertension Mother    Heart attack Father    Diabetes Father     SOCIAL HISTORY Social History   Tobacco Use    Smoking status: Never Smoker   Smokeless tobacco: Never Used  Scientific laboratory technician Use: Former  Substance Use Topics   Alcohol use: Not Currently   Drug use: Never         OPHTHALMIC EXAM:  Base Eye Exam    Visual Acuity (Snellen - Linear)      Right Left   Dist Schulter 20/70 20/80   Dist ph Oxly NI NI  Used single tumbling E.       Tonometry (Tonopen, 1:22 PM)      Right Left   Pressure 22 21       Pupils      Dark Light Shape React APD   Right 3 3 Round Minimal None   Left 3 3 Round Minimal None       Visual Fields   Patient unable to comprehend.       Extraocular Movement      Right Left    Full Full  Patient unable to comprehend.       Neuro/Psych    Oriented x3: Yes   Mood/Affect: Normal       Dilation    Both eyes: 1.0% Mydriacyl, 2.5% Phenylephrine @ 1:22 PM        Slit Lamp and Fundus Exam    Slit Lamp Exam      Right Left   Lids/Lashes Dermatochalasis - upper lid Dermatochalasis - upper lid, mild Meibomian gland dysfunction   Conjunctiva/Sclera White and quiet mild nasal and temporal Pinguecula   Cornea 1+ Punctate epithelial erosions, Debris in tear film 1+ Punctate epithelial erosions, Debris in tear film   Anterior Chamber deep, narrow temporal angle deep and clear   Iris Round and well dilated, No NVI Round and well dilated, No NVI   Lens 2+ Nuclear sclerosis, 2+ Cortical cataract 2+ Nuclear sclerosis, 2+ Cortical cataract   Vitreous Vitreous syneresis mild Vitreous syneresis       Fundus Exam      Right Left   Disc Pink and Sharp Mild Pallor greatest temporally, sharp rim   C/D Ratio 0.5 0.55   Macula Flat, good foveal reflex, slightly increased cystic changes Blunted foveal reflex, scattered MA, focal exudate, central cystic changes--slightly increased from prior    Vessels Vascular attenuation, mild tortuousity Vascular attenuation, mild AV crossing changes, Copper wiring   Periphery Attached, scattered MA, mild CWS about the  disc, slightly improved Attached, rare MA          IMAGING AND PROCEDURES  Imaging and Procedures for @TODAY @  OCT, Retina - OU - Both Eyes       Right Eye Quality was good. Central Foveal Thickness: 322. Progression has worsened. Findings include normal foveal contour, no SRF, intraretinal fluid, intraretinal hyper-reflective material (Interval increase in IRF/cystic changes ).  Left Eye Quality was good. Central Foveal Thickness: 387. Progression has worsened. Findings include no SRF, intraretinal fluid, intraretinal hyper-reflective material, abnormal foveal contour (Interval increase in IRF/cystic changes).   Notes *Images captured and stored on drive  Diagnosis / Impression:  DME OU (OS>OD)--interval increase from prior OD: NFP; interval increase in IRF/cystic changes  OS: Interval increase in IRF/cystic changes  Clinical management:  See below  Abbreviations: NFP - Normal foveal profile. CME - cystoid macular edema. PED - pigment epithelial detachment. IRF - intraretinal fluid. SRF - subretinal fluid. EZ - ellipsoid zone. ERM - epiretinal membrane. ORA - outer retinal atrophy. ORT - outer retinal tubulation. SRHM - subretinal hyper-reflective material                 ASSESSMENT/PLAN:    ICD-10-CM   1. Moderate nonproliferative diabetic retinopathy of both eyes with macular edema associated with type 2 diabetes mellitus ([facility])  G64.4034   2. Retinal edema  H35.81 OCT, Retina - OU - Both Eyes  3. Acute ischemic stroke (HCC) L MCA d/t L M1 oclusion s/p MCA stent  I63.9   4. Essential hypertension  I10   5. Hypertensive retinopathy of both eyes  H35.033   6. Combined forms of age-related cataract of both eyes  H25.813     1,2. Moderate nonproliferative diabetic retinopathy with DME OU  - The incidence, risk factors for progression, natural history and treatment options for diabetic retinopathy were discussed with patient.    - The need for close monitoring of  blood glucose, blood pressure, and serum lipids, avoiding cigarette or any type of tobacco, and the need for long term follow up was also discussed with patient.  - BCVA - 20/70 OD, 20/80 OS  - exam shows scattered IRH             - FA 9.10.21 shows late leaking MA, no NV OU  - The natural history, pathology, and characteristics of diabetic macular edema discussed with patient.  A generalized discussion of the major clinical trials concerning treatment of diabetic macular edema (ETDRS, DCT, SCORE, RISE / RIDE, and ongoing DRCR net studies) was completed.  This discussion included mention of the various approaches to treating diabetic macular edema (observation, laser photocoagulation, anti-VEGF injections with lucentis / Avastin / Eylea, steroid injections with Kenalog / Ozurdex, and intraocular surgery with vitrectomy).  The goal hemoglobin A1C of 6-7 was discussed, as well as importance of smoking cessation and hypertension control.  Need for ongoing treatment and monitoring were specifically discussed with reference to chronic nature of diabetic macular edema. - recommend IVA #1 OS today, 12.10.21 - pt wishes to proceed.   - Unable to obtain insurance authorization on 12.10.21.   - Will bring patient back week of 12.13 for IVA OS #1 - RBA of procedure discussed, questions answered - informed consent obtained and signed - see procedure note - f/u week of 12.13.21 for IVA OS.  Pending insurance authorization.  3. History of stroke  - with pt's nonverbal status, very difficult to assess level of vision and to parse out how much of his decline is related to diabetic retinopathy vs stroke  - suspect his stroke is driving majority of visual impairment as his diabetic eye disease remains relatively mild  - mild pallor of disc OS  - will monitor  4,5. Hypertensive retinopathy OU  - discussed importance of tight BP control  - monitor  6. Mixed form age related cataract  - The symptoms  of cataract,  surgical options, and treatments and risks were discussed with patient.  - discussed diagnosis and progression  - not yet visually significant  - monitor for now   Ophthalmic Meds Ordered this visit:  No orders of the defined types were placed in this encounter.      Return in about 1 week (around 11/20/2020) for Week of 12.13 for IVA OS.  Pending insurance verification.  There are no Patient Instructions on file for this visit.   Explained the diagnoses, plan, and follow up with the patient and they expressed understanding.  Patient expressed understanding of the importance of proper follow up care.   This document serves as a record of services personally performed by Gardiner Sleeper, MD, PhD. It was created on their behalf by San Jetty. Owens Shark, OA an ophthalmic technician. The creation of this record is the provider's dictation and/or activities during the visit.    This document serves as a record of services personally performed by Gardiner Sleeper, MD, PhD. It was created on their behalf by Leeann Must, [place], an ophthalmic technician. The creation of this record is the provider's dictation and/or activities during the visit.    Electronically signed by: Leeann Must, COA @TODAY @ 12:13 AM  Gardiner Sleeper, M.D., Ph.D. Diseases & Surgery of the Retina and [place] 11/13/2020   I have reviewed the above documentation for accuracy and completeness, and I agree with the above. Gardiner Sleeper, M.D., Ph.D. 11/14/20 12:13 AM   Abbreviations: M myopia (nearsighted); A astigmatism; H hyperopia (farsighted); P presbyopia; Mrx spectacle prescription;  CTL contact lenses; OD right eye; OS left eye; OU both eyes  XT exotropia; ET esotropia; PEK punctate epithelial keratitis; PEE punctate epithelial erosions; DES dry eye syndrome; MGD meibomian gland dysfunction; ATs artificial tears; PFAT's preservative free artificial tears; [place] nuclear sclerotic  cataract; PSC posterior subcapsular cataract; ERM epi-retinal membrane; PVD posterior vitreous detachment; RD retinal detachment; DM diabetes mellitus; DR diabetic retinopathy; NPDR non-proliferative diabetic retinopathy; PDR proliferative diabetic retinopathy; CSME clinically significant macular edema; DME diabetic macular edema; dbh dot blot hemorrhages; CWS cotton wool spot; POAG primary open angle glaucoma; C/D cup-to-disc ratio; HVF humphrey visual field; GVF goldmann visual field; OCT optical coherence tomography; IOP intraocular pressure; BRVO Branch retinal vein occlusion; CRVO central retinal vein occlusion; CRAO central retinal artery occlusion; BRAO branch retinal artery occlusion; RT retinal tear; SB scleral buckle; PPV pars plana vitrectomy; VH Vitreous hemorrhage; PRP panretinal laser photocoagulation; IVK intravitreal kenalog; VMT vitreomacular traction; MH Macular hole;  NVD neovascularization of the disc; NVE neovascularization elsewhere; AREDS age related eye disease study; ARMD age related macular degeneration; POAG primary open angle glaucoma; EBMD epithelial/anterior basement membrane dystrophy; ACIOL anterior chamber intraocular lens; IOL intraocular lens; PCIOL posterior chamber intraocular lens; Phaco/IOL phacoemulsification with intraocular lens placement; [place] photorefractive keratectomy; LASIK laser assisted in situ keratomileusis; HTN hypertension; DM diabetes mellitus; COPD chronic obstructive pulmonary disease

## 2020-11-17 ENCOUNTER — Encounter (INDEPENDENT_AMBULATORY_CARE_PROVIDER_SITE_OTHER): Payer: BC Managed Care – PPO | Admitting: Ophthalmology

## 2020-11-26 DIAGNOSIS — J069 Acute upper respiratory infection, unspecified: Secondary | ICD-10-CM | POA: Diagnosis not present

## 2020-11-26 DIAGNOSIS — Z0001 Encounter for general adult medical examination with abnormal findings: Secondary | ICD-10-CM | POA: Diagnosis not present

## 2020-11-26 DIAGNOSIS — Z125 Encounter for screening for malignant neoplasm of prostate: Secondary | ICD-10-CM | POA: Diagnosis not present

## 2020-11-26 DIAGNOSIS — Z79899 Other long term (current) drug therapy: Secondary | ICD-10-CM | POA: Diagnosis not present

## 2020-12-02 DIAGNOSIS — E782 Mixed hyperlipidemia: Secondary | ICD-10-CM | POA: Diagnosis not present

## 2020-12-02 DIAGNOSIS — I10 Essential (primary) hypertension: Secondary | ICD-10-CM | POA: Diagnosis not present

## 2020-12-02 DIAGNOSIS — E1165 Type 2 diabetes mellitus with hyperglycemia: Secondary | ICD-10-CM | POA: Diagnosis not present

## 2020-12-02 DIAGNOSIS — I639 Cerebral infarction, unspecified: Secondary | ICD-10-CM | POA: Diagnosis not present

## 2020-12-10 NOTE — Progress Notes (Signed)
[facility] Note  12/15/2020     CHIEF COMPLAINT Patient presents for Retina Follow Up   HISTORY OF PRESENT ILLNESS: Brad Delacruz is a 60 y.o. male who presents to the clinic today for:  HPI    Retina Follow Up    Patient presents with  Diabetic Retinopathy.  In both eyes.  Duration of 4 weeks.  Since onset it is stable.  I, the attending physician,  performed the HPI with the patient and updated documentation appropriately.          Comments    4 week follow up NPDR OU- Vision stable OU. BS unsure A1C unsure       Last edited by Bernarda Caffey, MD on 12/15/2020  1:20 PM. (History)      Referring physician: Celene Squibb, MD [place],  [place] [id number]  HISTORICAL INFORMATION:   Selected notes from the MEDICAL RECORD NUMBER Referred by Dr. Madelin Headings for concern of NPDR OU LEE: 03.03.20 [name]) [BCVA: OU: 20/20-] Ocular Hx-NPDR OU PMH-DM (takes Farxiga/metformin)   CURRENT MEDICATIONS: No current outpatient medications on file. (Ophthalmic Drugs)   No current facility-administered medications for this visit. (Ophthalmic Drugs)   Current Outpatient Medications (Other)  Medication Sig  . acetaminophen (TYLENOL) 325 MG tablet Take 2 tablets (650 mg total) by mouth every 6 (six) hours as needed for mild pain or moderate pain.  Marland Kitchen amantadine (SYMMETREL) 50 MG/5ML solution Take 10 mLs (100 mg total) by mouth 2 (two) times daily.  Marland Kitchen aspirin 81 MG chewable tablet Chew 1 tablet (81 mg total) by mouth daily.  Marland Kitchen atorvastatin (LIPITOR) 80 MG tablet Take 1 tablet (80 mg total) by mouth daily.  . insulin glargine (LANTUS) 100 UNIT/ML Solostar Pen Inject 34 Units into the skin 2 (two) times daily.  Marland Kitchen lidocaine (LIDODERM) 5 % Place 1 patch onto the skin daily. Remove & Discard patch within 12 hours or as directed by MD  . losartan (COZAAR) 25 MG tablet Take 0.5 tablets (12.5 mg total) by mouth daily.  . methocarbamol (ROBAXIN) 500  MG tablet Take 500 mg by mouth every 8 (eight) hours as needed.  . tamsulosin (FLOMAX) 0.4 MG CAPS capsule Take 1 capsule (0.4 mg total) by mouth daily after supper.  . ticagrelor (BRILINTA) 90 MG TABS tablet Take 1 tablet (90 mg total) by mouth 2 (two) times daily.  . traMADol (ULTRAM) 50 MG tablet Take 1 tablet (50 mg total) by mouth every 6 (six) hours as needed for severe pain.  . blood glucose meter kit and supplies KIT Dispense based on patient and insurance preference. Use up to four times daily as directed. (FOR ICD-9 250.00, 250.01).  . PENTIPS 32G X 4 MM MISC See admin instructions.   No current facility-administered medications for this visit. (Other)      REVIEW OF SYSTEMS: ROS    Positive for: Neurological, Endocrine, Cardiovascular, Eyes   Negative for: Constitutional, Gastrointestinal, Skin, Genitourinary, Musculoskeletal, HENT, Respiratory, Psychiatric, Allergic/Imm, Heme/Lymph   Last edited by Leonie Douglas, COA on 12/15/2020  9:51 AM. (History)       ALLERGIES No Known Allergies  PAST MEDICAL HISTORY Past Medical History:  Diagnosis Date  . Diabetes mellitus without complication ([place])   . Stroke [facility])    Past Surgical History:  Procedure Laterality Date  . IR CT HEAD LTD  03/24/2020  . IR CT HEAD LTD  03/24/2020  . [place]  STENT  03/24/2020  . IR PATIENT EVAL TECH 0-60 MINS  03/25/2020  . IR PERCUTANEOUS ART THROMBECTOMY/INFUSION INTRACRANIAL INC DIAG ANGIO  03/24/2020  . RADIOLOGY WITH ANESTHESIA N/A 03/24/2020   Procedure: IR WITH ANESTHESIA;  Surgeon: Luanne Bras, MD;  Location: [place];  Service: Radiology;  Laterality: N/A;    FAMILY HISTORY Family History  Problem Relation Age of Onset  . Stroke Mother   . Hypertension Mother   . Heart attack Father   . Diabetes Father     SOCIAL HISTORY Social History   Tobacco Use  . Smoking status: Never Smoker  . Smokeless tobacco: Never Used  Vaping Use  . Vaping Use: Former  Substance Use  Topics  . Alcohol use: Not Currently  . Drug use: Never         OPHTHALMIC EXAM:  Base Eye Exam    Visual Acuity (Snellen - Linear)   Unable to speak and understand.  I tried letters, numbers, and tumbling E.   With daughter patient is saying he can see the tumbling E but unable to communicate which way it is pointing.  His rehab doctor says the same thing that it is poor communication.         Tonometry (Tonopen, 10:02 AM)      Right Left   Pressure 17 17       Pupils      Dark Light Shape React APD   Right 4 3 Round Minimal None   Left 4 3 Round Minimal None       Visual Fields   Unable to understand.       Extraocular Movement      Right Left    Full Full       Neuro/Psych    Oriented x3: Yes   Mood/Affect: Normal       Dilation    Both eyes: 1.0% Mydriacyl, 2.5% Phenylephrine @ 10:02 AM        Slit Lamp and Fundus Exam    Slit Lamp Exam      Right Left   Lids/Lashes Dermatochalasis - upper lid Dermatochalasis - upper lid, mild Meibomian gland dysfunction   Conjunctiva/Sclera White and quiet mild nasal and temporal Pinguecula   Cornea 1+ Punctate epithelial erosions, Debris in tear film 1+ Punctate epithelial erosions, Debris in tear film   Anterior Chamber deep, narrow temporal angle deep and clear   Iris Round and well dilated, No NVI Round and well dilated, No NVI   Lens 2+ Nuclear sclerosis, 2+ Cortical cataract 2+ Nuclear sclerosis, 2+ Cortical cataract   Vitreous Vitreous syneresis mild Vitreous syneresis       Fundus Exam      Right Left   Disc Pink and Sharp Mild Pallor greatest temporally, sharp rim, +SVP   C/D Ratio 0.5 0.55   Macula Flat, good foveal reflex, MA/DBH greatest superiorly, scattered non-central cystic changes improved from prior good foveal reflex, scattered MA with cluster superior and nasal macula, focal exudate, persistent cystic changes / edema   Vessels Vascular attenuation, mild tortuousity Vascular attenuation, mild  AV crossing changes, Copper wiring   Periphery Attached, mild scattered MA and CWS greatest posteriorly Attached, mild scattered MA and CWS greatest posteriorly          IMAGING AND PROCEDURES  Imaging and Procedures for @TODAY @  OCT, Retina - OU - Both Eyes       Right Eye Quality was good. Central Foveal Thickness: 314. Progression has improved. Findings  include normal foveal contour, no SRF, intraretinal fluid, intraretinal hyper-reflective material (Mild Interval improvement in IRF/cystic changes ).   Left Eye Quality was good. Central Foveal Thickness: 360. Progression has improved. Findings include no SRF, intraretinal fluid, intraretinal hyper-reflective material, abnormal foveal contour (Mild Interval improvement in IRF/edema).   Notes *Images captured and stored on drive  Diagnosis / Impression:  DME OU (OS>OD) OD: NFP; Mild Interval improvement in IRF/cystic changes  OS: Mild Interval improvement in IRF/edema  Clinical management:  See below  Abbreviations: NFP - Normal foveal profile. CME - cystoid macular edema. PED - pigment epithelial detachment. IRF - intraretinal fluid. SRF - subretinal fluid. EZ - ellipsoid zone. ERM - epiretinal membrane. ORA - outer retinal atrophy. ORT - outer retinal tubulation. SRHM - subretinal hyper-reflective material                 ASSESSMENT/PLAN:    ICD-10-CM   1. Moderate nonproliferative diabetic retinopathy of both eyes with macular edema associated with type 2 diabetes mellitus ([place])  Y10.1751   2. Retinal edema  H35.81 OCT, Retina - OU - Both Eyes  3. Acute ischemic stroke (HCC) L MCA d/t L M1 oclusion s/p MCA stent  I63.9   4. Essential hypertension  I10   5. Hypertensive retinopathy of both eyes  H35.033   6. Combined forms of age-related cataract of both eyes  H25.813     1,2. Moderate nonproliferative diabetic retinopathy with DME OU  - BCVA - 20/70 OD, 20/80 OS -- unable to obtain today due to poor  cooperation  - exam shows scattered [place]             - FA (09.10.21) shows late leaking MA, no NV OU - OCT shows interval improvement in IRF/DME - we had recommend IVA #1 OS at last visit, but recommend monitoring today, 01.11.22, due to spontaneous improvement - pt and daughter agree - also insurance authorization remains pending  - f/u 6 weeks, DFE, OCT -- Pending insurance authorization.  3. History of stroke  - with pt's nonverbal status, very difficult to assess level of vision and to parse out how much of his decline is related to diabetic retinopathy vs stroke  - suspect his stroke is driving majority of visual impairment as his diabetic eye disease remains relatively mild  - mild pallor of disc OS  - will monitor  4,5. Hypertensive retinopathy OU  - discussed importance of tight BP control  - monitor  6. Mixed form age related cataract  - The symptoms of cataract, surgical options, and treatments and risks were discussed with patient.  - discussed diagnosis and progression  - not yet visually significant  - monitor for now  Ophthalmic Meds Ordered this visit:  No orders of the defined types were placed in this encounter.     Return in about 6 weeks (around 01/26/2021) for f/u NPDR OU, DFE, OCT.  There are no Patient Instructions on file for this visit.  Explained the diagnoses, plan, and follow up with the patient and they expressed understanding.  Patient expressed understanding of the importance of proper follow up care.   This document serves as a record of services personally performed by Gardiner Sleeper, MD, PhD. It was created on their behalf by San Jetty. Owens Shark, OA an ophthalmic technician. The creation of this record is the provider's dictation and/or activities during the visit.    Electronically signed by: San Jetty. Owens Shark, OA 01.11.2022 1:21 PM  Sharyon Cable  Coralyn Pear, M.D., Ph.D. Diseases & Surgery of the Retina and [place] 12/15/2020   I have reviewed the above documentation for accuracy and completeness, and I agree with the above. Gardiner Sleeper, M.D., Ph.D. 12/15/20 1:23 PM   Abbreviations: M myopia (nearsighted); A astigmatism; H hyperopia (farsighted); P presbyopia; Mrx spectacle prescription;  CTL contact lenses; OD right eye; OS left eye; OU both eyes  XT exotropia; ET esotropia; PEK punctate epithelial keratitis; PEE punctate epithelial erosions; DES dry eye syndrome; MGD meibomian gland dysfunction; ATs artificial tears; PFAT's preservative free artificial tears; [place] nuclear sclerotic cataract; PSC posterior subcapsular cataract; ERM epi-retinal membrane; PVD posterior vitreous detachment; RD retinal detachment; DM diabetes mellitus; DR diabetic retinopathy; NPDR non-proliferative diabetic retinopathy; PDR proliferative diabetic retinopathy; CSME clinically significant macular edema; DME diabetic macular edema; dbh dot blot hemorrhages; CWS cotton wool spot; POAG primary open angle glaucoma; C/D cup-to-disc ratio; HVF humphrey visual field; GVF goldmann visual field; OCT optical coherence tomography; IOP intraocular pressure; BRVO Branch retinal vein occlusion; CRVO central retinal vein occlusion; CRAO central retinal artery occlusion; BRAO branch retinal artery occlusion; RT retinal tear; SB scleral buckle; PPV pars plana vitrectomy; VH Vitreous hemorrhage; PRP panretinal laser photocoagulation; IVK intravitreal kenalog; VMT vitreomacular traction; MH Macular hole;  NVD neovascularization of the disc; NVE neovascularization elsewhere; AREDS age related eye disease study; ARMD age related macular degeneration; POAG primary open angle glaucoma; EBMD epithelial/anterior basement membrane dystrophy; ACIOL anterior chamber intraocular lens; IOL intraocular lens; PCIOL posterior chamber intraocular lens; Phaco/IOL phacoemulsification with intraocular lens placement; [place] photorefractive keratectomy; LASIK laser  assisted in situ keratomileusis; HTN hypertension; DM diabetes mellitus; COPD chronic obstructive pulmonary disease

## 2020-12-12 DIAGNOSIS — I63512 Cerebral infarction due to unspecified occlusion or stenosis of left middle cerebral artery: Secondary | ICD-10-CM | POA: Diagnosis not present

## 2020-12-13 DIAGNOSIS — I63512 Cerebral infarction due to unspecified occlusion or stenosis of left middle cerebral artery: Secondary | ICD-10-CM | POA: Diagnosis not present

## 2020-12-15 ENCOUNTER — Ambulatory Visit (INDEPENDENT_AMBULATORY_CARE_PROVIDER_SITE_OTHER): Payer: BC Managed Care – PPO | Admitting: Ophthalmology

## 2020-12-15 ENCOUNTER — Encounter (INDEPENDENT_AMBULATORY_CARE_PROVIDER_SITE_OTHER): Payer: Self-pay | Admitting: Ophthalmology

## 2020-12-15 ENCOUNTER — Other Ambulatory Visit: Payer: Self-pay

## 2020-12-15 DIAGNOSIS — H3581 Retinal edema: Secondary | ICD-10-CM

## 2020-12-15 DIAGNOSIS — E113313 Type 2 diabetes mellitus with moderate nonproliferative diabetic retinopathy with macular edema, bilateral: Secondary | ICD-10-CM

## 2020-12-15 DIAGNOSIS — H35033 Hypertensive retinopathy, bilateral: Secondary | ICD-10-CM

## 2020-12-15 DIAGNOSIS — H25813 Combined forms of age-related cataract, bilateral: Secondary | ICD-10-CM

## 2020-12-15 DIAGNOSIS — I10 Essential (primary) hypertension: Secondary | ICD-10-CM

## 2020-12-15 DIAGNOSIS — I639 Cerebral infarction, unspecified: Secondary | ICD-10-CM | POA: Diagnosis not present

## 2020-12-30 DIAGNOSIS — J069 Acute upper respiratory infection, unspecified: Secondary | ICD-10-CM | POA: Diagnosis not present

## 2020-12-30 DIAGNOSIS — I10 Essential (primary) hypertension: Secondary | ICD-10-CM | POA: Diagnosis not present

## 2021-01-11 ENCOUNTER — Ambulatory Visit: Payer: BC Managed Care – PPO | Admitting: Neurology

## 2021-01-12 DIAGNOSIS — I63512 Cerebral infarction due to unspecified occlusion or stenosis of left middle cerebral artery: Secondary | ICD-10-CM | POA: Diagnosis not present

## 2021-01-13 DIAGNOSIS — I63512 Cerebral infarction due to unspecified occlusion or stenosis of left middle cerebral artery: Secondary | ICD-10-CM | POA: Diagnosis not present

## 2021-01-20 NOTE — Progress Notes (Shared)
[facility] Note  01/26/2021     CHIEF COMPLAINT Patient presents for No chief complaint on file.   HISTORY OF PRESENT ILLNESS: Brad Delacruz is a 60 y.o. male who presents to the clinic today for:    Referring physician: Celene Squibb, MD [place],  [place] [id number]  HISTORICAL INFORMATION:   Selected notes from the MEDICAL RECORD NUMBER Referred by Dr. Madelin Headings for concern of NPDR OU LEE: 03.03.20 [name]) [BCVA: OU: 20/20-] Ocular Hx-NPDR OU PMH-DM (takes Farxiga/metformin)   CURRENT MEDICATIONS: No current outpatient medications on file. (Ophthalmic Drugs)   No current facility-administered medications for this visit. (Ophthalmic Drugs)   Current Outpatient Medications (Other)  Medication Sig  . acetaminophen (TYLENOL) 325 MG tablet Take 2 tablets (650 mg total) by mouth every 6 (six) hours as needed for mild pain or moderate pain.  Marland Kitchen amantadine (SYMMETREL) 50 MG/5ML solution Take 10 mLs (100 mg total) by mouth 2 (two) times daily.  Marland Kitchen aspirin 81 MG chewable tablet Chew 1 tablet (81 mg total) by mouth daily.  Marland Kitchen atorvastatin (LIPITOR) 80 MG tablet Take 1 tablet (80 mg total) by mouth daily.  . blood glucose meter kit and supplies KIT Dispense based on patient and insurance preference. Use up to four times daily as directed. (FOR ICD-9 250.00, 250.01).  . insulin glargine (LANTUS) 100 UNIT/ML Solostar Pen Inject 34 Units into the skin 2 (two) times daily.  Marland Kitchen lidocaine (LIDODERM) 5 % Place 1 patch onto the skin daily. Remove & Discard patch within 12 hours or as directed by MD  . losartan (COZAAR) 25 MG tablet Take 0.5 tablets (12.5 mg total) by mouth daily.  . methocarbamol (ROBAXIN) 500 MG tablet Take 500 mg by mouth every 8 (eight) hours as needed.  Marland Kitchen PENTIPS 32G X 4 MM MISC See admin instructions.  . tamsulosin (FLOMAX) 0.4 MG CAPS capsule Take 1 capsule (0.4 mg total) by mouth daily after supper.  . ticagrelor  (BRILINTA) 90 MG TABS tablet Take 1 tablet (90 mg total) by mouth 2 (two) times daily.  . traMADol (ULTRAM) 50 MG tablet Take 1 tablet (50 mg total) by mouth every 6 (six) hours as needed for severe pain.   No current facility-administered medications for this visit. (Other)      REVIEW OF SYSTEMS:    ALLERGIES No Known Allergies  PAST MEDICAL HISTORY Past Medical History:  Diagnosis Date  . Diabetes mellitus without complication ([place])   . Stroke [facility])    Past Surgical History:  Procedure Laterality Date  . IR CT HEAD LTD  03/24/2020  . IR CT HEAD LTD  03/24/2020  . IR INTRA CRAN STENT  03/24/2020  . IR PATIENT EVAL TECH 0-60 MINS  03/25/2020  . IR PERCUTANEOUS ART THROMBECTOMY/INFUSION INTRACRANIAL INC DIAG ANGIO  03/24/2020  . RADIOLOGY WITH ANESTHESIA N/A 03/24/2020   Procedure: IR WITH ANESTHESIA;  Surgeon: Luanne Bras, MD;  Location: [place];  Service: Radiology;  Laterality: N/A;    FAMILY HISTORY Family History  Problem Relation Age of Onset  . Stroke Mother   . Hypertension Mother   . Heart attack Father   . Diabetes Father     SOCIAL HISTORY Social History   Tobacco Use  . Smoking status: Never Smoker  . Smokeless tobacco: Never Used  Vaping Use  . Vaping Use: Former  Substance Use Topics  . Alcohol use: Not Currently  . Drug use: Never  OPHTHALMIC EXAM:  Not recorded     IMAGING AND PROCEDURES  Imaging and Procedures for $RemoveBefore'@TODAY'[id number]$ @           ASSESSMENT/PLAN:  No diagnosis found.  1,2. Moderate nonproliferative diabetic retinopathy with DME OU  - BCVA - 20/70 OD, 20/80 OS -- unable to obtain today due to poor cooperation  - exam shows scattered [place]             - FA (09.10.21) shows late leaking MA, no NV OU - OCT shows interval improvement in IRF/DME - we had recommend IVA #1 OS at last visit, but recommend monitoring today, 01.11.22, due to spontaneous improvement - pt and daughter agree - also insurance authorization  remains pending  - f/u 6 weeks, DFE, OCT -- Pending insurance authorization.  3. History of stroke  - with pt's nonverbal status, very difficult to assess level of vision and to parse out how much of his decline is related to diabetic retinopathy vs stroke  - suspect his stroke is driving majority of visual impairment as his diabetic eye disease remains relatively mild  - mild pallor of disc OS  - will monitor  4,5. Hypertensive retinopathy OU  - discussed importance of tight BP control  - monitor  6. Mixed form age related cataract  - The symptoms of cataract, surgical options, and treatments and risks were discussed with patient.  - discussed diagnosis and progression  - not yet visually significant  - monitor for now  Ophthalmic Meds Ordered this visit:  No orders of the defined types were placed in this encounter.     No follow-ups on file.  There are no Patient Instructions on file for this visit.  Explained the diagnoses, plan, and follow up with the patient and they expressed understanding.  Patient expressed understanding of the importance of proper follow up care.   This document serves as a record of services personally performed by Gardiner Sleeper, MD, PhD. It was created on their behalf by Estill Bakes, COT an ophthalmic technician. The creation of this record is the provider's dictation and/or activities during the visit.    Electronically signed by: Estill Bakes, COT 2.16.22 @ 9:59 AM  Abbreviations: M myopia (nearsighted); A astigmatism; H hyperopia (farsighted); P presbyopia; Mrx spectacle prescription;  CTL contact lenses; OD right eye; OS left eye; OU both eyes  XT exotropia; ET esotropia; PEK punctate epithelial keratitis; PEE punctate epithelial erosions; DES dry eye syndrome; MGD meibomian gland dysfunction; ATs artificial tears; PFAT's preservative free artificial tears; [place] nuclear sclerotic cataract; PSC posterior subcapsular cataract; ERM epi-retinal  membrane; PVD posterior vitreous detachment; RD retinal detachment; DM diabetes mellitus; DR diabetic retinopathy; NPDR non-proliferative diabetic retinopathy; PDR proliferative diabetic retinopathy; CSME clinically significant macular edema; DME diabetic macular edema; dbh dot blot hemorrhages; CWS cotton wool spot; POAG primary open angle glaucoma; C/D cup-to-disc ratio; HVF humphrey visual field; GVF goldmann visual field; OCT optical coherence tomography; IOP intraocular pressure; BRVO Branch retinal vein occlusion; CRVO central retinal vein occlusion; CRAO central retinal artery occlusion; BRAO branch retinal artery occlusion; RT retinal tear; SB scleral buckle; PPV pars plana vitrectomy; VH Vitreous hemorrhage; PRP panretinal laser photocoagulation; IVK intravitreal kenalog; VMT vitreomacular traction; MH Macular hole;  NVD neovascularization of the disc; NVE neovascularization elsewhere; AREDS age related eye disease study; ARMD age related macular degeneration; POAG primary open angle glaucoma; EBMD epithelial/anterior basement membrane dystrophy; ACIOL anterior chamber intraocular lens; IOL intraocular lens; PCIOL posterior chamber intraocular lens; Phaco/IOL phacoemulsification with  intraocular lens placement; [place] photorefractive keratectomy; LASIK laser assisted in situ keratomileusis; HTN hypertension; DM diabetes mellitus; COPD chronic obstructive pulmonary disease

## 2021-01-23 DIAGNOSIS — J069 Acute upper respiratory infection, unspecified: Secondary | ICD-10-CM | POA: Diagnosis not present

## 2021-01-25 ENCOUNTER — Other Ambulatory Visit (HOSPITAL_COMMUNITY): Payer: Self-pay | Admitting: Internal Medicine

## 2021-01-25 DIAGNOSIS — R059 Cough, unspecified: Secondary | ICD-10-CM

## 2021-01-26 ENCOUNTER — Encounter (INDEPENDENT_AMBULATORY_CARE_PROVIDER_SITE_OTHER): Payer: BC Managed Care – PPO | Admitting: Ophthalmology

## 2021-01-26 DIAGNOSIS — H35033 Hypertensive retinopathy, bilateral: Secondary | ICD-10-CM

## 2021-01-26 DIAGNOSIS — I639 Cerebral infarction, unspecified: Secondary | ICD-10-CM

## 2021-01-26 DIAGNOSIS — E113313 Type 2 diabetes mellitus with moderate nonproliferative diabetic retinopathy with macular edema, bilateral: Secondary | ICD-10-CM

## 2021-01-26 DIAGNOSIS — H25813 Combined forms of age-related cataract, bilateral: Secondary | ICD-10-CM

## 2021-01-26 DIAGNOSIS — I10 Essential (primary) hypertension: Secondary | ICD-10-CM

## 2021-01-26 DIAGNOSIS — H3581 Retinal edema: Secondary | ICD-10-CM

## 2021-02-09 DIAGNOSIS — I63512 Cerebral infarction due to unspecified occlusion or stenosis of left middle cerebral artery: Secondary | ICD-10-CM | POA: Diagnosis not present

## 2021-02-10 DIAGNOSIS — I63512 Cerebral infarction due to unspecified occlusion or stenosis of left middle cerebral artery: Secondary | ICD-10-CM | POA: Diagnosis not present

## 2021-02-22 NOTE — Progress Notes (Signed)
[facility] Note  02/24/2021     CHIEF COMPLAINT Patient presents for Retina Follow Up   HISTORY OF PRESENT ILLNESS: Brad Delacruz is a 60 y.o. male who presents to the clinic today for:  HPI    Retina Follow Up    Patient presents with  Diabetic Retinopathy.  In both eyes.  This started months ago.  Severity is moderate.  Duration of 10 weeks.  Since onset it is stable.  I, the attending physician,  performed the HPI with the patient and updated documentation appropriately.          Comments    60 y/o male pt here for 10 wk f/u for mod NPDR w/DME OU.  Pt is non-verbal following stroke, so pt's daughter provided info for hx.  No obvious change in [place] OU.  Denies pain, FOL, floaters.  No gtts.  BS 130 this a.m.  A1C unknown, but "normal."       Last edited by Brad Caffey, MD on 02/24/2021 12:44 PM. (History)    pt does not feel like his vision is any worse, daughter states that he is not complaining about his vision at home either, daughter states blood sugar is checked twice a day and it stays around 100-143, she states he is not on BP medication and his BP is pretty good  Referring physician: Celene Squibb, MD [place],  [place] [id number]  HISTORICAL INFORMATION:   Selected notes from the MEDICAL RECORD NUMBER Referred by Dr. Madelin Delacruz for concern of NPDR OU LEE: 03.03.20 Brad Delacruz) [BCVA: OU: 20/20-] Ocular Hx-NPDR OU PMH-DM (takes Farxiga/metformin)   CURRENT MEDICATIONS: No current outpatient medications on file. (Ophthalmic Drugs)   No current facility-administered medications for this visit. (Ophthalmic Drugs)   Current Outpatient Medications (Other)  Medication Sig  . acetaminophen (TYLENOL) 325 MG tablet Take 2 tablets (650 mg total) by mouth every 6 (six) hours as needed for mild pain or moderate pain.  Marland Kitchen amantadine (SYMMETREL) 50 MG/5ML solution Take 10 mLs (100 mg total) by mouth 2 (two) times daily.  Marland Kitchen aspirin  81 MG chewable tablet Chew 1 tablet (81 mg total) by mouth daily.  Marland Kitchen atorvastatin (LIPITOR) 80 MG tablet Take 1 tablet (80 mg total) by mouth daily.  . benzonatate (TESSALON) 200 MG capsule SMARTSIG:1 Capsule(s) By Mouth Every 12 Hours PRN  . blood glucose meter kit and supplies KIT Dispense based on patient and insurance preference. Use up to four times daily as directed. (FOR ICD-9 250.00, 250.01).  . fluticasone (FLONASE) 50 MCG/ACT nasal spray Place 1 spray into both nostrils 2 (two) times daily.  . insulin glargine (LANTUS) 100 UNIT/ML Solostar Pen Inject 34 Units into the skin 2 (two) times daily.  Marland Kitchen lidocaine (LIDODERM) 5 % Place 1 patch onto the skin daily. Remove & Discard patch within 12 hours or as directed by MD  . losartan (COZAAR) 25 MG tablet Take 0.5 tablets (12.5 mg total) by mouth daily.  . methocarbamol (ROBAXIN) 500 MG tablet Take 500 mg by mouth every 8 (eight) hours as needed.  Marland Kitchen PENTIPS 32G X 4 MM MISC See admin instructions.  . tamsulosin (FLOMAX) 0.4 MG CAPS capsule Take 1 capsule (0.4 mg total) by mouth daily after supper.  . ticagrelor (BRILINTA) 90 MG TABS tablet Take 1 tablet (90 mg total) by mouth 2 (two) times daily.  . traMADol (ULTRAM) 50 MG tablet Take 1 tablet (50 mg total)  by mouth every 6 (six) hours as needed for severe pain.   No current facility-administered medications for this visit. (Other)      REVIEW OF SYSTEMS: ROS    Positive for: Neurological, Endocrine, Eyes   Negative for: Constitutional, Gastrointestinal, Skin, Genitourinary, Musculoskeletal, HENT, Cardiovascular, Respiratory, Psychiatric, Allergic/Imm, Heme/Lymph   Last edited by Brad Delacruz, COA on 02/24/2021 10:13 AM. (History)       ALLERGIES No Known Allergies  PAST MEDICAL HISTORY Past Medical History:  Diagnosis Date  . Cataract    Mixed OU  . Diabetes mellitus without complication ([place])   . Diabetic retinopathy ([place])    NPDR OU  . Hypertensive retinopathy    OU  .  Stroke [facility])    Past Surgical History:  Procedure Laterality Date  . IR CT HEAD LTD  03/24/2020  . IR CT HEAD LTD  03/24/2020  . IR INTRA CRAN STENT  03/24/2020  . IR PATIENT EVAL TECH 0-60 MINS  03/25/2020  . IR PERCUTANEOUS ART THROMBECTOMY/INFUSION INTRACRANIAL INC DIAG ANGIO  03/24/2020  . RADIOLOGY WITH ANESTHESIA N/A 03/24/2020   Procedure: IR WITH ANESTHESIA;  Surgeon: Brad Bras, MD;  Location: [facility];  Service: Radiology;  Laterality: N/A;    FAMILY HISTORY Family History  Problem Relation Age of Onset  . Stroke Mother   . Hypertension Mother   . Heart attack Father   . Diabetes Father     SOCIAL HISTORY Social History   Tobacco Use  . Smoking status: Never Smoker  . Smokeless tobacco: Never Used  Vaping Use  . Vaping Use: Former  Substance Use Topics  . Alcohol use: Not Currently  . Drug use: Never         OPHTHALMIC EXAM:  Base Eye Exam    Visual Acuity (Snellen - Linear)   Unable to assess.  Pt is non-verbal following stroke.  I tried tumbling E's, and even pt's daughter tried to assist pt in demonstrating how to show what he was seeing, but pt was not cooperative.       Tonometry (Tonopen, 10:18 AM)      Right Left   Pressure 19 17       Pupils      Dark Light Shape React APD   Right 4 3 Round Minimal None   Left 4 3 Round Minimal None       Visual Fields   Unable to assess due to pt being non-verbal and unable to find another way to communicate.       Extraocular Movement      Right Left    Full, Ortho Full, Ortho       Neuro/Psych    Oriented x3: Yes   Mood/Affect: Normal       Dilation    Both eyes: 1.0% Mydriacyl, 2.5% Phenylephrine @ 10:18 AM        Slit Lamp and Fundus Exam    Slit Lamp Exam      Right Left   Lids/Lashes Dermatochalasis - upper lid Dermatochalasis - upper lid, mild Meibomian gland dysfunction   Conjunctiva/Sclera White and quiet mild nasal and temporal Pinguecula   Cornea 1+ Punctate epithelial  erosions, Debris in tear film 1+ Punctate epithelial erosions, Debris in tear film   Anterior Chamber deep, narrow temporal angle deep and clear   Iris Round and well dilated, No NVI Round and well dilated, No NVI   Lens 2+ Nuclear sclerosis, 2+ Cortical cataract 2+ Nuclear sclerosis, 2+ Cortical  cataract   Vitreous Vitreous syneresis mild Vitreous syneresis       Fundus Exam      Right Left   Disc Pink and Sharp Mild Pallor greatest temporally, sharp rim, +SVP   C/D Ratio 0.5 0.55   Macula Flat, good foveal reflex, MA/DBH greatest superiorly, scattered non-central cystic changes improved from prior good foveal reflex, scattered MA with cluster superior and nasal macula, focal exudate, persistent cystic changes / edema   Vessels Vascular attenuation, mild tortuousity Vascular attenuation, mild AV crossing changes, Copper wiring   Periphery Attached, mild scattered MA and CWS greatest posteriorly Attached, mild scattered MA and CWS greatest posteriorly          IMAGING AND PROCEDURES  Imaging and Procedures for @TODAY @  OCT, Retina - OU - Both Eyes       Right Eye Quality was good. Central Foveal Thickness: 308. Progression has been stable. Findings include normal foveal contour, no SRF, intraretinal fluid, intraretinal hyper-reflective material (Persistent cystic changes ).   Left Eye Quality was good. Central Foveal Thickness: 365. Progression has worsened. Findings include no SRF, intraretinal fluid, intraretinal hyper-reflective material, abnormal foveal contour (Mild interval increase in IRF).   Notes *Images captured and stored on drive  Diagnosis / Impression:  DME OU (OS>OD) OD: NFP; Persistent cystic changes OS: Mild interval increase in IRF  Clinical management:  See below  Abbreviations: NFP - Normal foveal profile. CME - cystoid macular edema. PED - pigment epithelial detachment. IRF - intraretinal fluid. SRF - subretinal fluid. EZ - ellipsoid zone. ERM -  epiretinal membrane. ORA - outer retinal atrophy. ORT - outer retinal tubulation. SRHM - subretinal hyper-reflective material                 ASSESSMENT/PLAN:    ICD-10-CM   1. Moderate nonproliferative diabetic retinopathy of both eyes with macular edema associated with type 2 diabetes mellitus ([place])  G86.7619   2. Retinal edema  H35.81 OCT, Retina - OU - Both Eyes  3. Acute ischemic stroke (HCC) L MCA d/t L M1 oclusion s/p MCA stent  I63.9   4. Essential hypertension  I10   5. Hypertensive retinopathy of both eyes  H35.033   6. Combined forms of age-related cataract of both eyes  H25.813     1,2. Moderate nonproliferative diabetic retinopathy with DME OU  - BCVA - 20/70 OD, 20/80 OS in Dec 2021 -- unable to obtain today due to poor cooperation  - exam shows scattered [place]             - FA (09.10.21) shows late leaking MA, no NV OU  - OCT shows OD: NFP; Persistent cystic changes; OS: Mild interval increase in IRF  - subjectively pt and daughter report no changes in vision and pt reports no significant difficulty in activities of daily living due to vision  - discussed difficulty in assessing functional vision without visual acuity measurement  - discussed possibility of IVA to treat DME evident on OCT - pt and daughter wish to defer treatment at this time as pt subjectively reports no issues with vision -- reasonable - also insurance authorization remains pending  - discussed importance of tight BG and BP control - f/u 3 months, sooner prn -- DFE, OCT  3. History of stroke  - with pt's nonverbal status, very difficult to assess level of vision and to parse out how much of his decline is related to diabetic retinopathy vs stroke  - suspect his stroke  is driving majority of visual impairment as his diabetic eye disease remains relatively mild  - mild pallor of disc OS  - will monitor  4,5. Hypertensive retinopathy OU  - discussed importance of tight BP control  -  monitor  6. Mixed form age related cataract  - The symptoms of cataract, surgical options, and treatments and risks were discussed with patient.  - discussed diagnosis and progression  - not yet visually significant  - monitor for now  Ophthalmic Meds Ordered this visit:  No orders of the defined types were placed in this encounter.     Return in about 3 months (around 05/27/2021) for f/u NPDR OU, DFE, OCT.  There are no Patient Instructions on file for this visit.  Explained the diagnoses, plan, and follow up with the patient and they expressed understanding.  Patient expressed understanding of the importance of proper follow up care.   This document serves as a record of services personally performed by Gardiner Sleeper, MD, PhD. It was created on their behalf by Roselee Nova, COMT. The creation of this record is the provider's dictation and/or activities during the visit.  Electronically signed by: Roselee Nova, COMT 02/24/21 12:49 PM   This document serves as a record of services personally performed by Gardiner Sleeper, MD, PhD. It was created on their behalf by San Jetty. Owens Shark, OA an ophthalmic technician. The creation of this record is the provider's dictation and/or activities during the visit.    Electronically signed by: San Jetty. Owens Shark, [place] 03.23.2022 12:49 PM  Gardiner Sleeper, M.D., Ph.D. Diseases & Surgery of the Retina and Vitreous [facility]  I have reviewed the above documentation for accuracy and completeness, and I agree with the above. Gardiner Sleeper, M.D., Ph.D. 02/24/21 12:49 PM  Abbreviations: M myopia (nearsighted); A astigmatism; H hyperopia (farsighted); P presbyopia; Mrx spectacle prescription;  CTL contact lenses; OD right eye; OS left eye; OU both eyes  XT exotropia; ET esotropia; PEK punctate epithelial keratitis; PEE punctate epithelial erosions; DES dry eye syndrome; MGD meibomian gland dysfunction; ATs artificial tears; PFAT's  preservative free artificial tears; [place] nuclear sclerotic cataract; PSC posterior subcapsular cataract; ERM epi-retinal membrane; PVD posterior vitreous detachment; RD retinal detachment; DM diabetes mellitus; DR diabetic retinopathy; NPDR non-proliferative diabetic retinopathy; PDR proliferative diabetic retinopathy; CSME clinically significant macular edema; DME diabetic macular edema; dbh dot blot hemorrhages; CWS cotton wool spot; POAG primary open angle glaucoma; C/D cup-to-disc ratio; HVF humphrey visual field; GVF goldmann visual field; OCT optical coherence tomography; IOP intraocular pressure; BRVO Branch retinal vein occlusion; CRVO central retinal vein occlusion; CRAO central retinal artery occlusion; BRAO branch retinal artery occlusion; RT retinal tear; SB scleral buckle; PPV pars plana vitrectomy; VH Vitreous hemorrhage; PRP panretinal laser photocoagulation; IVK intravitreal kenalog; VMT vitreomacular traction; MH Macular hole;  NVD neovascularization of the disc; NVE neovascularization elsewhere; AREDS age related eye disease study; ARMD age related macular degeneration; POAG primary open angle glaucoma; EBMD epithelial/anterior basement membrane dystrophy; ACIOL anterior chamber intraocular lens; IOL intraocular lens; PCIOL posterior chamber intraocular lens; Phaco/IOL phacoemulsification with intraocular lens placement; Crum photorefractive keratectomy; LASIK laser assisted in situ keratomileusis; HTN hypertension; DM diabetes mellitus; COPD chronic obstructive pulmonary disease

## 2021-02-24 ENCOUNTER — Encounter (INDEPENDENT_AMBULATORY_CARE_PROVIDER_SITE_OTHER): Payer: Self-pay | Admitting: Ophthalmology

## 2021-02-24 ENCOUNTER — Other Ambulatory Visit: Payer: Self-pay

## 2021-02-24 ENCOUNTER — Ambulatory Visit (INDEPENDENT_AMBULATORY_CARE_PROVIDER_SITE_OTHER): Payer: BC Managed Care – PPO | Admitting: Ophthalmology

## 2021-02-24 DIAGNOSIS — E113313 Type 2 diabetes mellitus with moderate nonproliferative diabetic retinopathy with macular edema, bilateral: Secondary | ICD-10-CM

## 2021-02-24 DIAGNOSIS — H3581 Retinal edema: Secondary | ICD-10-CM | POA: Diagnosis not present

## 2021-02-24 DIAGNOSIS — I639 Cerebral infarction, unspecified: Secondary | ICD-10-CM | POA: Diagnosis not present

## 2021-02-24 DIAGNOSIS — I10 Essential (primary) hypertension: Secondary | ICD-10-CM | POA: Diagnosis not present

## 2021-02-24 DIAGNOSIS — H25813 Combined forms of age-related cataract, bilateral: Secondary | ICD-10-CM

## 2021-02-24 DIAGNOSIS — H35033 Hypertensive retinopathy, bilateral: Secondary | ICD-10-CM

## 2021-03-11 ENCOUNTER — Other Ambulatory Visit: Payer: Self-pay

## 2021-03-11 ENCOUNTER — Encounter
Payer: BC Managed Care – PPO | Attending: Physical Medicine & Rehabilitation | Admitting: Physical Medicine & Rehabilitation

## 2021-03-11 ENCOUNTER — Encounter: Payer: Self-pay | Admitting: Physical Medicine & Rehabilitation

## 2021-03-11 VITALS — BP 136/78 | HR 67 | Temp 98.0°F

## 2021-03-11 DIAGNOSIS — I69351 Hemiplegia and hemiparesis following cerebral infarction affecting right dominant side: Secondary | ICD-10-CM | POA: Diagnosis not present

## 2021-03-11 NOTE — Progress Notes (Signed)
Subjective:    Patient ID: Brad Delacruz, male    DOB: 01-01-1961, 60 y.o.   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  His stroke was approximately 1 year ago.  His daughter is with him today, he remains severely aphasic and is able to give 1 word answers to simple questions.  He has had no significant shoulder pain on the right side.  He does have some increased tightness of his fingers according to the daughter and the patient points to his fingers as well.  He is wearing a sling in the right upper extremity due to weakness in the right arm. He requires assistance for self-care.  He ambulates holding onto the couch with his wife assisting him on the other side for short distances.  He does not receive any formalized therapy program at this time.  Pain Inventory Average Pain 0 Pain Right Now 0 My pain is NO PAIN  LOCATION OF PAIN  No pain  BOWEL Number of stools per week: 14 Oral laxative use unknown Type of laxative unknpwm Enema or suppository use No  History of colostomy No  Incontinent No   BLADDER Normal and Pads In and out cath, frequency n/a Able to self cath No  Bladder incontinence No  Frequent urination No  Leakage with coughing No  Difficulty starting stream No  Incomplete bladder emptying No    Mobility how many minutes can you walk? unknown ability to climb steps?  no do you drive?  no use a wheelchair transfers alone Do you have any goals in this area?  yes  Function disabled: date disabled in progress I need assistance with the following:  dressing, bathing, meal prep, household duties and shopping Do you have any goals in this area?  yes  Neuro/Psych bowel control problems weakness trouble walking  Prior Studies Any changes since last visit?  no  Physicians involved in your care Any changes since last visit?  no   Family  History  Problem Relation Age of Onset  . Stroke Mother   . Hypertension Mother   . Heart attack Father   . Diabetes Father    Social History   Socioeconomic History  . Marital status: Married    Spouse name: Not on file  . Number of children: 5  . Years of education: Not on file  . Highest education level: Not on file  Occupational History  . Not on file  Tobacco Use  . Smoking status: Never Smoker  . Smokeless tobacco: Never Used  Vaping Use  . Vaping Use: Former  Substance and Sexual Activity  . Alcohol use: Not Currently  . Drug use: Never  . Sexual activity: Not on file  Other Topics Concern  . Not on file  Social History Narrative   Lives with wife, and 1 daughter   Social Determinants of Health   Financial Resource Strain: Not on file  Food Insecurity: Not on file  Transportation Needs: Not on file  Physical Activity: Not on file  Stress: Not on file  Social Connections: Not on file   Past Surgical History:  Procedure Laterality Date  . IR CT HEAD LTD  03/24/2020  . IR CT HEAD LTD  03/24/2020  . IR INTRA CRAN STENT  03/24/2020  . IR PATIENT EVAL TECH 0-60 MINS  03/25/2020  . IR PERCUTANEOUS ART THROMBECTOMY/INFUSION INTRACRANIAL INC DIAG ANGIO  03/24/2020  .  RADIOLOGY WITH ANESTHESIA N/A 03/24/2020   Procedure: IR WITH ANESTHESIA;  Surgeon: Julieanne Cotton, MD;  Location: [facility];  Service: Radiology;  Laterality: N/A;   Past Medical History:  Diagnosis Date  . Cataract    Mixed OU  . Diabetes mellitus without complication (HCC)   . Diabetic retinopathy (HCC)    NPDR OU  . Hypertensive retinopathy    OU  . Stroke (HCC)    BP 136/78   Pulse 67   Temp 98 F (36.7 C)   Opioid Risk Score:   Fall Risk Score:  `1  Depression screen PHQ 2/9  Depression screen [facility] 2/9 08/11/2020 05/25/2020  Decreased Interest 1 0  Down, Depressed, Hopeless 1 0  PHQ - 2 Score 2 0  Altered sleeping - 0  Tired, decreased energy - 0  Change in appetite - 0  Feeling bad  or failure about yourself  - 0  Trouble concentrating - 1  Moving slowly or fidgety/restless - 0  Suicidal thoughts - 0  PHQ-9 Score - 1   Review of Systems  Genitourinary: Negative for frequency.       Incontinence   Musculoskeletal: Positive for gait problem.  All other systems reviewed and are negative.      Objective:   Physical Exam Vitals and nursing note reviewed.  Constitutional:      Appearance: He is obese.  Eyes:     Extraocular Movements: Extraocular movements intact.     Conjunctiva/sclera: Conjunctivae normal.     Pupils: Pupils are equal, round, and reactive to light.  Musculoskeletal:     Comments: No pain with right shoulder range of motion, no pain with right lower extremity range of motion negative straight leg raising No evidence of joint swelling in the fingers or wrist on the right side or in the right knee.  Skin:    General: Skin is warm and dry.  Neurological:     General: No focal deficit present.     Mental Status: He is alert and oriented to person, place, and time.     Gait: Gait abnormal.     Comments: Sit to stand with standby assistance. Motor strength is 0/5 in the right deltoid bicep tricep grip Strength is 3 - at the right hip flexors and knee extensors and 0 at the ankle dorsiflexor Tone MAS 3 in finger flexors on the right side  Psychiatric:        Mood and Affect: Mood normal.           Assessment & Plan:  #1.  Left MCA distribution infarct with right spastic hemiplegia and aphasia.  As discussed with patient and daughter deficits have plateaued at this point.  We discussed the importance of keeping up with attempts at speech in conversation.  We also discussed importance of continuing home exercise program. 2.  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  Will schedule for botulinum toxin injection right FDS 50 units right FDP 50 units

## 2021-03-11 NOTE — Patient Instructions (Signed)
OnabotulinumtoxinA injection (Medical Use) Qu es este medicamento? La ONABOTULINUMTOXINA es un bloqueante neuromuscular. Este medicamento se [place] para tratar el estrabismos, espasmos de los prpados, espasmos musculares severos del cuello, espasmos musculares del tobillo y los dedos de los pies, y espasmos musculares del codo, la [place] y los dedos. Tambin se [place] para tratar la sudoracin excesiva de las [place], para prevenir las migraas crnicas, y para tratar la prdida de control de la vejiga debido a condiciones neurolgicas tales como esclerosis mltiple y lesin de la mdula espinal. Este medicamento puede ser utilizado para otros usos; si tiene alguna pregunta consulte con su proveedor de atencin mdica o con su farmacutico. Este medicamento puede ser utilizado para otros usos; si tiene alguna pregunta consulte con su proveedor de atencin mdica o con su farmacutico. MARCAS COMUNES: Botox Qu le debo informar a mi profesional de la salud antes de tomar este medicamento? Necesita saber si usted presenta alguno de los siguientes problemas o situaciones:  problemas respiratorios  espasmos de parlisis cerebral  dificultad al orinar  problemas cardiacos  antecedentes de ciruga en el lugar donde va a administrar este medicamento  infeccin en el lugar donde va a Building services engineer este medicamento  miastenia gravis u otra enfermedad neurolgica  enfermedad de los nervios o muscular  planes para tener una ciruga  si toma medicamentos que tratan o previenen cogulos sanguneos  problemas de tiroides  Runner, broadcasting/film/video o inusual a la toxina botulnica, a la albmina, a otros medicamentos, alimentos, colorantes o conservantes  si est embarazada o buscando quedar embarazada  si est amamantando a un beb Cmo debo [employer]? Este medicamento se administra mediante inyeccin en un msculo. Lo administra un profesional de Radiographer, therapeutic en un hospital o en un  entorno clnico. Hable con su pediatra para informarse acerca del uso de este medicamento en nios. Aunque este medicamento se puede recetar a nios tan pequeos como de 2 aos de edad con ciertas afecciones, existen precauciones que deben tomarse. Sobredosis: Pngase en contacto inmediatamente con un centro toxicolgico o una sala de urgencia si usted cree que haya tomado demasiado medicamento. ATENCIN: [employer] es solo para usted. No comparta este medicamento con nadie. Sobredosis: Pngase en contacto inmediatamente con un centro toxicolgico o una sala de urgencia si usted cree que haya tomado demasiado medicamento. ATENCIN: [employer] es solo para usted. No comparta este medicamento con nadie. Qu sucede si me olvido de una dosis? No se aplica en este caso. Qu puede interactuar con este medicamento?  antibiticos aminoglucsidos, tales como gentamicina, neomicina, tobramicina  relajantes musculares  otras inyecciones de toxina botulnica Puede ser que esta lista no menciona todas las posibles interacciones. Informe a su profesional de [employer] de [employer] productos a base de hierbas, medicamentos de [place] o suplementos nutritivos que est tomando. Si usted fuma, consume bebidas alcohlicas o si utiliza drogas ilegales, indqueselo tambin a su profesional de [employer]. Algunas sustancias pueden interactuar con su medicamento. Puede ser que esta lista no menciona todas las posibles interacciones. Informe a su profesional de [employer] de [employer] productos a base de hierbas, medicamentos de [place] o suplementos nutritivos que est tomando. Si usted fuma, consume bebidas alcohlicas o si utiliza drogas ilegales, indqueselo tambin a su profesional de [employer]. Algunas sustancias pueden interactuar con su medicamento. A qu debo estar atento al usar [employer]? Visite a su mdico para chequear su evolucin peridicamente. Este medicamento causar [employer]  en el lugar de la inyeccin. Si experimenta debilidad inusual en otros msculos, informe a su mdico. Si experimenta problemas para respirar, tragar o hablar, busque ayuda mdica inmediatamente. Este medicamento podra hacer que sus prpados se caigan o tener visin borrosa o ver doble. Si tiene msculos dbiles o dificultad para ver no conduzca ni utilice maquinaria o realice otras actividades peligrosas. Este medicamento contiene albmina de [place]. Es posible que se transmita una infeccin al [employer] no ha habido informes de [place]. Consulte a su mdico [employer] y los beneficios de [employer]. Si sus actividades han sido limitadas por su problema, reanude sus actividades normales de a poco despus del tratamiento con este medicamento. Qu efectos secundarios puedo tener al [employer] este medicamento? Efectos secundarios que debe informar a su mdico o a Producer, television/film/video de la salud tan pronto como sea posible:  Therapist, art como erupcin cutnea, picazn o urticarias, hinchazn de la cara, labios o lengua  problemas respiratorios  cambios en la visin  dolor u opresin en el pecho  dolor, infeccin ocular  pulso cardiaco rpido, irregular  infeccin  entumecimiento  problemas del habla  dificultad para tragar  debilidad inusual Efectos secundarios que, por lo general, no requieren atencin mdica (debe informarlos a su mdico o a Producer, television/film/video de la salud si persisten o si son molestos):  magulladuras o Art therapist de la inyeccin  prpados cados  boca u ojos secos  dolor de cabeza  dolores, molestias musculares  sensibilidad a Statistician  lagrimeo Puede ser que esta lista no menciona todos los posibles efectos secundarios. Comunquese a su mdico por asesoramiento mdico [employer]. Usted puede informar los efectos secundarios a la FDA por telfono al 1-800-FDA-1088. Puede ser que esta lista no  menciona todos los posibles efectos secundarios. Comunquese a su mdico por asesoramiento mdico [employer]. Usted puede informar los efectos secundarios a la FDA por telfono al 1-800-FDA-1088. Dnde debo guardar mi medicina? Este medicamento se administra en hospitales o clnicas y no necesitar guardarlo en su domicilio.ATENCIN:Este folleto es un resumen. Puede ser que no cubra toda la posible informacin. Si usted tiene preguntas acerca de esta medicina, consulte con su mdico, su farmacutico o su profesional de Radiographer, therapeutic. ATENCIN: Este folleto es un resumen. Puede ser que no cubra toda la posible informacin. Si usted tiene preguntas acerca de esta medicina, consulte con su mdico, su farmacutico o su profesional de Radiographer, therapeutic.  2021 Elsevier/Gold Standard (2018-09-06 00:00:00)

## 2021-03-13 DIAGNOSIS — I63512 Cerebral infarction due to unspecified occlusion or stenosis of left middle cerebral artery: Secondary | ICD-10-CM | POA: Diagnosis not present

## 2021-03-29 DIAGNOSIS — J069 Acute upper respiratory infection, unspecified: Secondary | ICD-10-CM | POA: Diagnosis not present

## 2021-03-29 DIAGNOSIS — K5904 Chronic idiopathic constipation: Secondary | ICD-10-CM | POA: Diagnosis not present

## 2021-03-29 DIAGNOSIS — J060 Acute laryngopharyngitis: Secondary | ICD-10-CM | POA: Diagnosis not present

## 2021-03-29 DIAGNOSIS — E559 Vitamin D deficiency, unspecified: Secondary | ICD-10-CM | POA: Diagnosis not present

## 2021-04-01 DIAGNOSIS — E782 Mixed hyperlipidemia: Secondary | ICD-10-CM | POA: Diagnosis not present

## 2021-04-01 DIAGNOSIS — I10 Essential (primary) hypertension: Secondary | ICD-10-CM | POA: Diagnosis not present

## 2021-04-01 DIAGNOSIS — I69321 Dysphasia following cerebral infarction: Secondary | ICD-10-CM | POA: Diagnosis not present

## 2021-04-01 DIAGNOSIS — E1165 Type 2 diabetes mellitus with hyperglycemia: Secondary | ICD-10-CM | POA: Diagnosis not present

## 2021-04-12 DIAGNOSIS — I63512 Cerebral infarction due to unspecified occlusion or stenosis of left middle cerebral artery: Secondary | ICD-10-CM | POA: Diagnosis not present

## 2021-05-13 DIAGNOSIS — I63512 Cerebral infarction due to unspecified occlusion or stenosis of left middle cerebral artery: Secondary | ICD-10-CM | POA: Diagnosis not present

## 2021-05-25 NOTE — Progress Notes (Signed)
[facility] Note  05/27/2021     CHIEF COMPLAINT Patient presents for Retina Follow Up   HISTORY OF PRESENT ILLNESS: Brad Delacruz is a 60 y.o. male who presents to the clinic today for:  HPI     Retina Follow Up   Patient presents with  Diabetic Retinopathy.  In both eyes.  This started 13 weeks ago.  I, the attending physician,  performed the HPI with the patient and updated documentation appropriately.        Comments   Patient here for 13 weeks retina follow up for NPDR OU. Patient is non verbal. Daughter helped answer question. Vision about the same. No eye pain.       Last edited by Brad Caffey, MD on 05/28/2021 12:09 AM.     pt states vision is stable, daughter reports no new medical changes  Referring physician: Celene Squibb, MD [place],  [place] [address]  HISTORICAL INFORMATION:   Selected notes from the MEDICAL RECORD NUMBER Referred by Dr. Madelin Delacruz for concern of NPDR OU LEE: 03.03.20 Brad Delacruz) [BCVA: OU: 20/20-] Ocular Hx-NPDR OU PMH-DM (takes Farxiga/metformin)   CURRENT MEDICATIONS: No current outpatient medications on file. (Ophthalmic Drugs)   No current facility-administered medications for this visit. (Ophthalmic Drugs)   Current Outpatient Medications (Other)  Medication Sig   acetaminophen (TYLENOL) 325 MG tablet Take 2 tablets (650 mg total) by mouth every 6 (six) hours as needed for mild pain or moderate pain.   amantadine (SYMMETREL) 50 MG/5ML solution Take 10 mLs (100 mg total) by mouth 2 (two) times daily.   aspirin 81 MG chewable tablet Chew 1 tablet (81 mg total) by mouth daily.   atorvastatin (LIPITOR) 80 MG tablet Take 1 tablet (80 mg total) by mouth daily.   benzonatate (TESSALON) 200 MG capsule SMARTSIG:1 Capsule(s) By Mouth Every 12 Hours PRN   blood glucose meter kit and supplies KIT Dispense based on patient and insurance preference. Use up to four times daily as directed.  (FOR ICD-9 250.00, 250.01).   fluticasone (FLONASE) 50 MCG/ACT nasal spray Place 1 spray into both nostrils 2 (two) times daily.   insulin glargine (LANTUS) 100 UNIT/ML Solostar Pen Inject 34 Units into the skin 2 (two) times daily.   lidocaine (LIDODERM) 5 % Place 1 patch onto the skin daily. Remove & Discard patch within 12 hours or as directed by MD   losartan (COZAAR) 25 MG tablet Take 0.5 tablets (12.5 mg total) by mouth daily.   methocarbamol (ROBAXIN) 500 MG tablet Take 500 mg by mouth every 8 (eight) hours as needed.   PENTIPS 32G X 4 MM MISC See admin instructions.   tamsulosin (FLOMAX) 0.4 MG CAPS capsule Take 1 capsule (0.4 mg total) by mouth daily after supper.   ticagrelor (BRILINTA) 90 MG TABS tablet Take 1 tablet (90 mg total) by mouth 2 (two) times daily.   traMADol (ULTRAM) 50 MG tablet Take 1 tablet (50 mg total) by mouth every 6 (six) hours as needed for severe pain.   No current facility-administered medications for this visit. (Other)      REVIEW OF SYSTEMS: ROS   Positive for: Neurological, Endocrine, Eyes Negative for: Constitutional, Gastrointestinal, Skin, Genitourinary, Musculoskeletal, HENT, Cardiovascular, Respiratory, Psychiatric, Allergic/Imm, Heme/Lymph Last edited by Brad Delacruz, COA on 05/27/2021  9:24 AM.        ALLERGIES No Known Allergies  PAST MEDICAL HISTORY Past Medical History:  Diagnosis Date  Cataract    Mixed OU   Diabetes mellitus without complication ([place])    Diabetic retinopathy ([place])    NPDR OU   Hypertensive retinopathy    OU   Stroke [facility])    Past Surgical History:  Procedure Laterality Date   IR CT HEAD LTD  03/24/2020   IR CT HEAD LTD  03/24/2020   IR INTRA CRAN STENT  03/24/2020   IR PATIENT EVAL TECH 0-60 MINS  03/25/2020   IR PERCUTANEOUS ART THROMBECTOMY/INFUSION INTRACRANIAL INC DIAG ANGIO  03/24/2020   RADIOLOGY WITH ANESTHESIA N/A 03/24/2020   Procedure: IR WITH ANESTHESIA;  Surgeon: Luanne Bras, MD;   Location: [place];  Service: Radiology;  Laterality: N/A;    FAMILY HISTORY Family History  Problem Relation Age of Onset   Stroke Mother    Hypertension Mother    Heart attack Father    Diabetes Father     SOCIAL HISTORY Social History   Tobacco Use   Smoking status: Never   Smokeless tobacco: Never  Vaping Use   Vaping Use: Former  Substance Use Topics   Alcohol use: Not Currently   Drug use: Never         OPHTHALMIC EXAM:  Base Eye Exam     Visual Acuity (Snellen - Linear)       Right Left   Dist [place] 20/400 20/400  Patient is non verbal. Harder to get response. Used tumbling E's. Daughter assisted.        Tonometry (Tonopen, 9:21 AM)       Right Left   Pressure 12 20         Pupils       Dark Light Shape React APD   Right 4 3 Round Brisk None   Left 4 3 Round Brisk None         Visual Fields   unable        Extraocular Movement       Right Left    Full, Ortho Full, Ortho         Neuro/Psych     Oriented x3: Yes   Mood/Affect: Normal         Dilation     Both eyes: 1.0% Mydriacyl, 2.5% Phenylephrine @ 9:21 AM           Slit Lamp and Fundus Exam     Slit Lamp Exam       Right Left   Lids/Lashes Dermatochalasis - upper lid, Meibomian gland dysfunction Dermatochalasis - upper lid, mild Meibomian gland dysfunction   Conjunctiva/Sclera White and quiet mild nasal and temporal Pinguecula   Cornea 1+ Punctate epithelial erosions, Debris in tear film trace Punctate epithelial erosions, Debris in tear film   Anterior Chamber deep, narrow temporal angle deep and clear   Iris Round and well dilated, No NVI Round and well dilated, No NVI   Lens 2-3+ Nuclear sclerosis, 2-3+ Cortical cataract 2-3+ Nuclear sclerosis, 2-3+ Cortical cataract   Vitreous Vitreous syneresis mild Vitreous syneresis         Fundus Exam       Right Left   Disc Pink and Sharp Mild Pallor greatest temporally, sharp rim, +SVP   C/D Ratio 0.5 0.55    Macula Flat, good foveal reflex, MA/DBH greatest superiorly, scattered non-central cystic changes improved from prior good foveal reflex, scattered MA with cluster superior and nasal macula, focal exudate, persistent cystic changes / edema temporal mac   Vessels Vascular attenuation, mild tortuousity Vascular attenuation,  mild AV crossing changes, Copper wiring   Periphery Attached, mild scattered MA and DBH greatest posteriorly Attached, mild scattered MA and CWS greatest posteriorly            IMAGING AND PROCEDURES  Imaging and Procedures for _0 @  OCT, Retina - OU - Both Eyes       Right Eye Quality was good. Central Foveal Thickness: 312. Progression has improved. Findings include normal foveal contour, no SRF, intraretinal fluid, intraretinal hyper-reflective material (Mild interval improvement in cystic changes ).   Left Eye Quality was good. Central Foveal Thickness: 363. Progression has been stable. Findings include no SRF, intraretinal fluid, intraretinal hyper-reflective material, abnormal foveal contour (Persistent IRF/IRHM temporal fovea).   Notes *Images captured and stored on drive  Diagnosis / Impression:  DME OU (OS>OD) OD: NFP; Mild interval improvement in cystic changes  OS: Persistent IRF/IRHM temporal fovea  Clinical management:  See below  Abbreviations: NFP - Normal foveal profile. CME - cystoid macular edema. PED - pigment epithelial detachment. IRF - intraretinal fluid. SRF - subretinal fluid. EZ - ellipsoid zone. ERM - epiretinal membrane. ORA - outer retinal atrophy. ORT - outer retinal tubulation. SRHM - subretinal hyper-reflective material            ASSESSMENT/PLAN:    ICD-10-CM   1. Moderate nonproliferative diabetic retinopathy of both eyes with macular edema associated with type 2 diabetes mellitus ([place])  Y77.4128     2. Retinal edema  H35.81 OCT, Retina - OU - Both Eyes    3. Acute ischemic stroke (HCC) L MCA d/t L M1 oclusion s/p  MCA stent  I63.9     4. Essential hypertension  I10     5. Hypertensive retinopathy of both eyes  H35.033     6. Combined forms of age-related cataract of both eyes  H25.813        1,2. Moderate nonproliferative diabetic retinopathy with DME OU  - BCVA - 20/70 OD, 20/80 OS in Dec 2021; today -- 20/400 OU  - exam shows scattered [place]             - FA (09.10.21) shows late leaking MA, no NV OU  - OCT shows OD: Mild interval improvement in cystic changes ; OS: Persistent IRF/IRHM temporal fovea  - subjectively pt and daughter report no changes in vision and pt reports no significant difficulty in activities of daily living due to vision  - discussed difficulty in assessing functional vision without accurate visual acuity measurement  - discussed possibility of IVA to treat DME evident on OCT - pt and daughter wish to defer treatment at this time as pt subjectively reports no issues with vision -- reasonable  - also insurance authorization remains pending  - discussed importance of tight BG and BP control - f/u 4 months, sooner prn -- DFE, OCT  3. History of stroke  - with pt's nonverbal status, very difficult to assess level of vision and to parse out how much of his decline is related to diabetic retinopathy vs stroke  - suspect his stroke is driving majority of visual impairment as his diabetic eye disease remains relatively mild  - mild pallor of disc OS  - will monitor   4,5. Hypertensive retinopathy OU  - discussed importance of tight BP control  - monitor   6. Mixed form age related cataract  - The symptoms of cataract, surgical options, and treatments and risks were discussed with patient.  - discussed diagnosis and progression  -  not yet visually significant  - monitor for now   Ophthalmic Meds Ordered this visit:  No orders of the defined types were placed in this encounter.     Return in about 4 months (around 09/26/2021) for f/u NPDR OU, DFE, OCT.  There are no  Patient Instructions on file for this visit.  Explained the diagnoses, plan, and follow up with the patient and they expressed understanding.  Patient expressed understanding of the importance of proper follow up care.   This document serves as a record of services personally performed by Gardiner Sleeper, MD, PhD. It was created on their behalf by Leonie Douglas, an ophthalmic technician. The creation of this record is the provider's dictation and/or activities during the visit.    Electronically signed by: Leonie Douglas COA, 05/28/21  12:13 AM  This document serves as a record of services personally performed by Gardiner Sleeper, MD, PhD. It was created on their behalf by San Jetty. Owens Shark, OA an ophthalmic technician. The creation of this record is the provider's dictation and/or activities during the visit.    Electronically signed by: San Jetty. Owens Shark, OA 06.23.202212:13 AM  Gardiner Sleeper, M.D., Ph.D. Diseases & Surgery of the Retina and [place] 05/27/2021  I have reviewed the above documentation for accuracy and completeness, and I agree with the above. Gardiner Sleeper, M.D., Ph.D. 05/28/21 12:13 AM  Abbreviations: M myopia (nearsighted); A astigmatism; H hyperopia (farsighted); P presbyopia; Mrx spectacle prescription;  CTL contact lenses; OD right eye; OS left eye; OU both eyes  XT exotropia; ET esotropia; PEK punctate epithelial keratitis; PEE punctate epithelial erosions; DES dry eye syndrome; MGD meibomian gland dysfunction; ATs artificial tears; PFAT's preservative free artificial tears; [place] nuclear sclerotic cataract; PSC posterior subcapsular cataract; ERM epi-retinal membrane; PVD posterior vitreous detachment; RD retinal detachment; DM diabetes mellitus; DR diabetic retinopathy; NPDR non-proliferative diabetic retinopathy; PDR proliferative diabetic retinopathy; CSME clinically significant macular edema; DME diabetic macular edema; dbh dot blot  hemorrhages; CWS cotton wool spot; POAG primary open angle glaucoma; C/D cup-to-disc ratio; HVF humphrey visual field; GVF goldmann visual field; OCT optical coherence tomography; IOP intraocular pressure; BRVO Branch retinal vein occlusion; CRVO central retinal vein occlusion; CRAO central retinal artery occlusion; BRAO branch retinal artery occlusion; RT retinal tear; SB scleral buckle; PPV pars plana vitrectomy; VH Vitreous hemorrhage; PRP panretinal laser photocoagulation; IVK intravitreal kenalog; VMT vitreomacular traction; MH Macular hole;  NVD neovascularization of the disc; NVE neovascularization elsewhere; AREDS age related eye disease study; ARMD age related macular degeneration; POAG primary open angle glaucoma; EBMD epithelial/anterior basement membrane dystrophy; ACIOL anterior chamber intraocular lens; IOL intraocular lens; PCIOL posterior chamber intraocular lens; Phaco/IOL phacoemulsification with intraocular lens placement; [place] photorefractive keratectomy; LASIK laser assisted in situ keratomileusis; HTN hypertension; DM diabetes mellitus; COPD chronic obstructive pulmonary disease

## 2021-05-27 ENCOUNTER — Other Ambulatory Visit: Payer: Self-pay

## 2021-05-27 ENCOUNTER — Encounter (INDEPENDENT_AMBULATORY_CARE_PROVIDER_SITE_OTHER): Payer: Self-pay | Admitting: Ophthalmology

## 2021-05-27 ENCOUNTER — Ambulatory Visit (INDEPENDENT_AMBULATORY_CARE_PROVIDER_SITE_OTHER): Payer: BC Managed Care – PPO | Admitting: Ophthalmology

## 2021-05-27 DIAGNOSIS — H35033 Hypertensive retinopathy, bilateral: Secondary | ICD-10-CM

## 2021-05-27 DIAGNOSIS — H3581 Retinal edema: Secondary | ICD-10-CM

## 2021-05-27 DIAGNOSIS — I10 Essential (primary) hypertension: Secondary | ICD-10-CM | POA: Diagnosis not present

## 2021-05-27 DIAGNOSIS — H25813 Combined forms of age-related cataract, bilateral: Secondary | ICD-10-CM

## 2021-05-27 DIAGNOSIS — E113313 Type 2 diabetes mellitus with moderate nonproliferative diabetic retinopathy with macular edema, bilateral: Secondary | ICD-10-CM

## 2021-05-27 DIAGNOSIS — I639 Cerebral infarction, unspecified: Secondary | ICD-10-CM | POA: Diagnosis not present

## 2021-05-28 ENCOUNTER — Encounter (INDEPENDENT_AMBULATORY_CARE_PROVIDER_SITE_OTHER): Payer: Self-pay | Admitting: Ophthalmology

## 2021-06-10 ENCOUNTER — Encounter: Payer: Self-pay | Admitting: Physical Medicine & Rehabilitation

## 2021-07-12 ENCOUNTER — Ambulatory Visit: Payer: BC Managed Care – PPO | Admitting: Neurology

## 2021-08-10 ENCOUNTER — Encounter: Payer: Self-pay | Admitting: Neurology

## 2021-08-10 ENCOUNTER — Other Ambulatory Visit: Payer: Self-pay

## 2021-08-10 ENCOUNTER — Ambulatory Visit (INDEPENDENT_AMBULATORY_CARE_PROVIDER_SITE_OTHER): Payer: Self-pay | Admitting: Neurology

## 2021-08-10 VITALS — BP 116/69 | HR 61

## 2021-08-10 DIAGNOSIS — I69320 Aphasia following cerebral infarction: Secondary | ICD-10-CM

## 2021-08-10 DIAGNOSIS — I69359 Hemiplegia and hemiparesis following cerebral infarction affecting unspecified side: Secondary | ICD-10-CM

## 2021-08-10 NOTE — Progress Notes (Signed)
[facility] [address] [place]. [place] [address] [phone]       OFFICE FOLLOW-UP NOTE  [name] Date of Birth:  April 01, 1961 Medical Record Number:  [id number]   HPI: Mr. [name] is a 60 year old Hispanic male seen today for office follow-up visit following hospital admission for stroke in April 2021.  He is accompanied by his daughter who provides most of the history.  I personally reviewed electronic medical records and pertinent imaging films in PACS.Brad Delacruz is a 60 y.o. Hispanic male with past medical history of diabetes and hypertension for which he does not take medication for, presented to the emergency room at [facility] with last known well of 5 AM on 03/24/2020 when he was at work and was noted to have a sudden onset of right-sided weakness and inability to talk.  He was evaluated by teleneurology.  NIH stroke scale initially was noted to be 28.  CT head with hyperdense left MCA and CT angiogram with left M1 occlusion.  Exam consistent with left MCA syndrome.  CT perfusion study was done but he was within 6 hours-it showed a large code of 71 cc-Case was discussed with the interventionalists by the outgoing on-call neurologist and decision was made to offer thrombectomy.Patient was not given IV TPA at [facility] because they could not get history of whether he was on anticoagulation or not.  In the time that the patient was being transferred to [facility], daughter was able to be contacted, who was not sure if he is on anticoagulation.  She took some time and found out by the way of patient's wife that he is only taking medications for diabetes, does not take medications for his high blood pressure, and is not on any anticoagulation. As soon as the patient arrived to [facility], he was evaluated.  Exam continued to be consistent with left MCA syndrome.  He underwent emergent mechanical thrombectomy which required  rescue left middle cerebral artery stent achieving TICI 2c revascularization but there was resultant reocclusion with some subarachnoid hemorrhage and terminal left ICA thrombus.  2D echo showed ejection fraction of 60 to 65%.  Outpatient TEE and loop recorder suggested but since patient did not make significant functional improvement this has not been done.  Transcranial Doppler bubble study showed a small right to left shunt which was felt to be clinically insignificant and not responsible for his stroke.  LDL cholesterol 55 mg percent and hemoglobin A1c was 9.2.  He is admitted to intensive care unit and closely monitored.  He made gradual improvement and will transfer to inpatient rehab and has been home.  Patient has finished outpatient physical occupational and speech therapies.  He is presently living at home with his wife and daughter and has 24-hour supervision.  He is able to get up and walk with a walker but requires full assist.  He is able to speak a few words and short sentences but cannot carry out a conversation.  He is also able to understand only simple basic stuff.  He still has dense right hemiplegia and was considered for Botox for spasticity by Dr. Letta Pate but he ran out of his insurance and hence it was not done.  ROS:   14 system review of systems is positive for weakness, speech difficulty, difficulty understanding,, walking difficulty, pain, swelling all other systems negative  PMH:  Past Medical History:  Diagnosis Date   Cataract    Mixed OU  Diabetes mellitus without complication (HCC)    Diabetic retinopathy ([place])    NPDR OU   Hypertensive retinopathy    OU   Stroke [facility])     Social History:  Social History   Socioeconomic History   Marital status: Married    Spouse name: Not on file   Number of children: 5   Years of education: Not on file   Highest education level: Not on file  Occupational History   Not on file  Tobacco Use   Smoking status: Never    Smokeless tobacco: Never  Vaping Use   Vaping Use: Former  Substance and Sexual Activity   Alcohol use: Not Currently   Drug use: Never   Sexual activity: Not on file  Other Topics Concern   Not on file  Social History Narrative   Lives with wife, and 1 daughter   Social Determinants of Health   Financial Resource Strain: Not on file  Food Insecurity: Not on file  Transportation Needs: Not on file  Physical Activity: Not on file  Stress: Not on file  Social Connections: Not on file  Intimate Partner Violence: Not on file    Medications:   Current Outpatient Medications on File Prior to Visit  Medication Sig Dispense Refill   acetaminophen (TYLENOL) 325 MG tablet Take 2 tablets (650 mg total) by mouth every 6 (six) hours as needed for mild pain or moderate pain.     aspirin 81 MG chewable tablet Chew 1 tablet (81 mg total) by mouth daily.     atorvastatin (LIPITOR) 80 MG tablet Take 1 tablet (80 mg total) by mouth daily. 30 tablet 0   benzonatate (TESSALON) 200 MG capsule SMARTSIG:1 Capsule(s) By Mouth Every 12 Hours PRN     blood glucose meter kit and supplies KIT Dispense based on patient and insurance preference. Use up to four times daily as directed. (FOR ICD-9 250.00, 250.01). 1 each 0   fluticasone (FLONASE) 50 MCG/ACT nasal spray Place 1 spray into both nostrils 2 (two) times daily.     insulin glargine (LANTUS) 100 UNIT/ML Solostar Pen Inject 34 Units into the skin 2 (two) times daily. 15 mL 11   losartan (COZAAR) 25 MG tablet Take 0.5 tablets (12.5 mg total) by mouth daily. (Patient taking differently: Take 25 mg by mouth daily.) 30 tablet 1   methocarbamol (ROBAXIN) 500 MG tablet Take 500 mg by mouth every 8 (eight) hours as needed.     PENTIPS 32G X 4 MM MISC See admin instructions.     tamsulosin (FLOMAX) 0.4 MG CAPS capsule Take 1 capsule (0.4 mg total) by mouth daily after supper. 30 capsule 0   ticagrelor (BRILINTA) 90 MG TABS tablet Take 1 tablet (90 mg total) by  mouth 2 (two) times daily. 60 tablet 1   traMADol (ULTRAM) 50 MG tablet Take 1 tablet (50 mg total) by mouth every 6 (six) hours as needed for severe pain. 20 tablet 0   No current facility-administered medications on file prior to visit.    Allergies:  No Known Allergies  Physical Exam General: well developed, well nourished, middle-aged Hispanic male seated, in no evident distress Head: head normocephalic and atraumatic.  Neck: supple with no carotid or supraclavicular bruits Cardiovascular: regular rate and rhythm, no murmurs Musculoskeletal: no deformity Skin:  no rash/petichiae Vascular:  Normal pulses all extremities Vitals:   08/10/21 0954  BP: 116/69  Pulse: 61   Neurologic Exam Mental Status: Awake and fully alert.  Severe global aphasia limits and mental testing.  Speaks occasional words and short sentences.  Able to follow only simple one-step commands and commands to pantomime. Cranial Nerves: Fundoscopic exam reveals sharp disc margins. Pupils equal, briskly reactive to light. Extraocular movements full without nystagmus. Visual fields demonstrate right homonymous hemianopsia to confrontation. Hearing intact. Facial sensation intact.  Moderate right lower facial weakness., tongue, palate moves normally and symmetrically.  Motor: Normal bulk and tone. Normal strength in all tested extremity muscles.  On the left side.  Dense right hemiplegia with 0/5 right upper extremity strength nonfixed flexion contracture of the elbow and fingers.  2/5 strength proximally in the right lower extremity with 0/5 strength distally with right foot drop. Sensory.: intact to touch ,pinprick .position and vibratory sensation.  Coordination: Rapid alternating movements normal left-sided l extremities.  And cannot be tested on the right.   Gait and Station: Deferred as patient is full assist even with a walker and did not bring his walker. Reflexes: 2+ and asymmetric and brisker on the right15. Toes  downgoing.   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  Vascular risk factors of diabetes, hypertension hyperlipidemia     PLAN: I had a long d/w patient and his daughter about his remote stroke, risk for recurrent stroke/TIAs, personally independently reviewed imaging studies and stroke evaluation results and answered questions.Continue aspirin 81 mg daily and Brilinta 90 mg twice daily for 6 more weeks and then stop Brilinta and stay on aspirin alone for secondary stroke prevention and maintain strict control of hypertension with blood pressure goal below 130/90, diabetes with hemoglobin A1c goal below 6.5% and lipids with LDL cholesterol goal below 70 mg/dL. I also advised the patient to eat a healthy diet with plenty of whole grains, cereals, fruits and vegetables, exercise regularly and maintain ideal body weight continue ambulation with one-person support using a walker and we discussed fall prevention precautions.  Followup in the future with me only as necessary and no schedule appointment was made. Greater than 50% of time during this 25 minute visit was spent on counseling,explanation of diagnosis, planning of further management, discussion with patient and family and coordination of care Antony Contras, MD Note: This document was prepared with digital dictation and possible smart phrase technology. Any transcriptional errors that result from this process are unintentional

## 2021-08-10 NOTE — Patient Instructions (Signed)
I had a long d/w patient and his daughter about his remote stroke, risk for recurrent stroke/TIAs, personally independently reviewed imaging studies and stroke evaluation results and answered questions.Continue aspirin 81 mg daily and Brilinta 90 mg twice daily for 6 more weeks and then stop Brilinta and stay on aspirin alone for secondary stroke prevention and maintain strict control of hypertension with blood pressure goal below 130/90, diabetes with hemoglobin A1c goal below 6.5% and lipids with LDL cholesterol goal below 70 mg/dL. I also advised the patient to eat a healthy diet with plenty of whole grains, cereals, fruits and vegetables, exercise regularly and maintain ideal body weight continue ambulation with one-person support using a walker and we discussed fall prevention precautions.  Followup in the future with me only as necessary and no schedule appointment was made. Fall Prevention in the Home, Adult Falls can cause injuries and can happen to people of all ages. There are many things you can do to make your home safe and to help prevent falls. Ask for help when making these changes. What actions can I take to prevent falls? General Instructions Use good lighting in all rooms. Replace any light bulbs that burn out. Turn on the lights in dark areas. Use night-lights. Keep items that you use often in easy-to-reach places. Lower the shelves around your home if needed. Set up your furniture so you have a clear path. Avoid moving your furniture around. Do not have throw rugs or other things on the floor that can make you trip. Avoid walking on wet floors. If any of your floors are uneven, fix them. Add color or contrast paint or tape to clearly mark and help you see: Grab bars or handrails. First and last steps of staircases. Where the edge of each step is. If you use a stepladder: Make sure that it is fully opened. Do not climb a closed stepladder. Make sure the sides of the stepladder  are locked in place. Ask someone to hold the stepladder while you use it. Know where your pets are when moving through your home. What can I do in the bathroom?   Keep the floor dry. Clean up any water on the floor right away. Remove soap buildup in the tub or shower. Use nonskid mats or decals on the floor of the tub or shower. Attach bath mats securely with double-sided, nonslip rug tape. If you need to sit down in the shower, use a plastic, nonslip stool. Install grab bars by the toilet and in the tub and shower. Do not use towel bars as grab bars. What can I do in the bedroom? Make sure that you have a light by your bed that is easy to reach. Do not use any sheets or blankets for your bed that hang to the floor. Have a firm chair with side arms that you can use for support when you get dressed. What can I do in the kitchen? Clean up any spills right away. If you need to reach something above you, use a step stool with a grab bar. Keep electrical cords out of the way. Do not use floor polish or wax that makes floors slippery. What can I do with my stairs? Do not leave any items on the stairs. Make sure that you have a light switch at the top and the bottom of the stairs. Make sure that there are handrails on both sides of the stairs. Fix handrails that are broken or loose. Install nonslip stair treads on  all your stairs. Avoid having throw rugs at the top or bottom of the stairs. Choose a carpet that does not hide the edge of the steps on the stairs. Check carpeting to make sure that it is firmly attached to the stairs. Fix carpet that is loose or worn. What can I do on the outside of my home? Use bright outdoor lighting. Fix the edges of walkways and driveways and fix any cracks. Remove anything that might make you trip as you walk through a door, such as a raised step or threshold. Trim any bushes or trees on paths to your home. Check to see if handrails are loose or broken and  that both sides of all steps have handrails. Install guardrails along the edges of any raised decks and porches. Clear paths of anything that can make you trip, such as tools or rocks. Have leaves, snow, or ice cleared regularly. Use sand or salt on paths during winter. Clean up any spills in your garage right away. This includes grease or oil spills. What other actions can I take? Wear shoes that: Have a low heel. Do not wear high heels. Have rubber bottoms. Feel good on your feet and fit well. Are closed at the toe. Do not wear open-toe sandals. Use tools that help you move around if needed. These include: Canes. Walkers. Scooters. Crutches. Review your medicines with your doctor. Some medicines can make you feel dizzy. This can increase your chance of falling. Ask your doctor what else you can do to help prevent falls. Where to find more information Centers for Disease Control and Prevention, [employer]: FootballExhibition.com.br [employer] on Aging: https://walker.com/ Contact a doctor if: You are afraid of falling at home. You feel weak, drowsy, or dizzy at home. You fall at home. Summary There are many simple things that you can do to make your home safe and to help prevent falls. Ways to make your home safe include removing things that can make you trip and installing grab bars in the bathroom. Ask for help when making these changes in your home. This information is not intended to replace advice given to you by your health care provider. Make sure you discuss any questions you have with your health care provider. Document Revised: 06/24/2020 Document Reviewed: 06/24/2020 Elsevier Patient Education  2022 [employer].

## 2021-09-27 ENCOUNTER — Encounter (INDEPENDENT_AMBULATORY_CARE_PROVIDER_SITE_OTHER): Payer: Self-pay | Admitting: Ophthalmology

## 2021-09-27 DIAGNOSIS — I10 Essential (primary) hypertension: Secondary | ICD-10-CM

## 2021-09-27 DIAGNOSIS — I639 Cerebral infarction, unspecified: Secondary | ICD-10-CM

## 2021-09-27 DIAGNOSIS — E113313 Type 2 diabetes mellitus with moderate nonproliferative diabetic retinopathy with macular edema, bilateral: Secondary | ICD-10-CM

## 2021-09-27 DIAGNOSIS — H25813 Combined forms of age-related cataract, bilateral: Secondary | ICD-10-CM

## 2021-09-27 DIAGNOSIS — H35033 Hypertensive retinopathy, bilateral: Secondary | ICD-10-CM

## 2021-09-27 DIAGNOSIS — H3581 Retinal edema: Secondary | ICD-10-CM

## 2021-11-18 IMAGING — DX DG CHEST 1V PORT
1 series · 1 of 1 positions shown · non-contrast
Comparison: Chest x-ray 03/28/2020.

CLINICAL DATA: 58-year-old male with history of hypoxemia.

EXAM:
PORTABLE CHEST 1 VIEW

[chest ap]
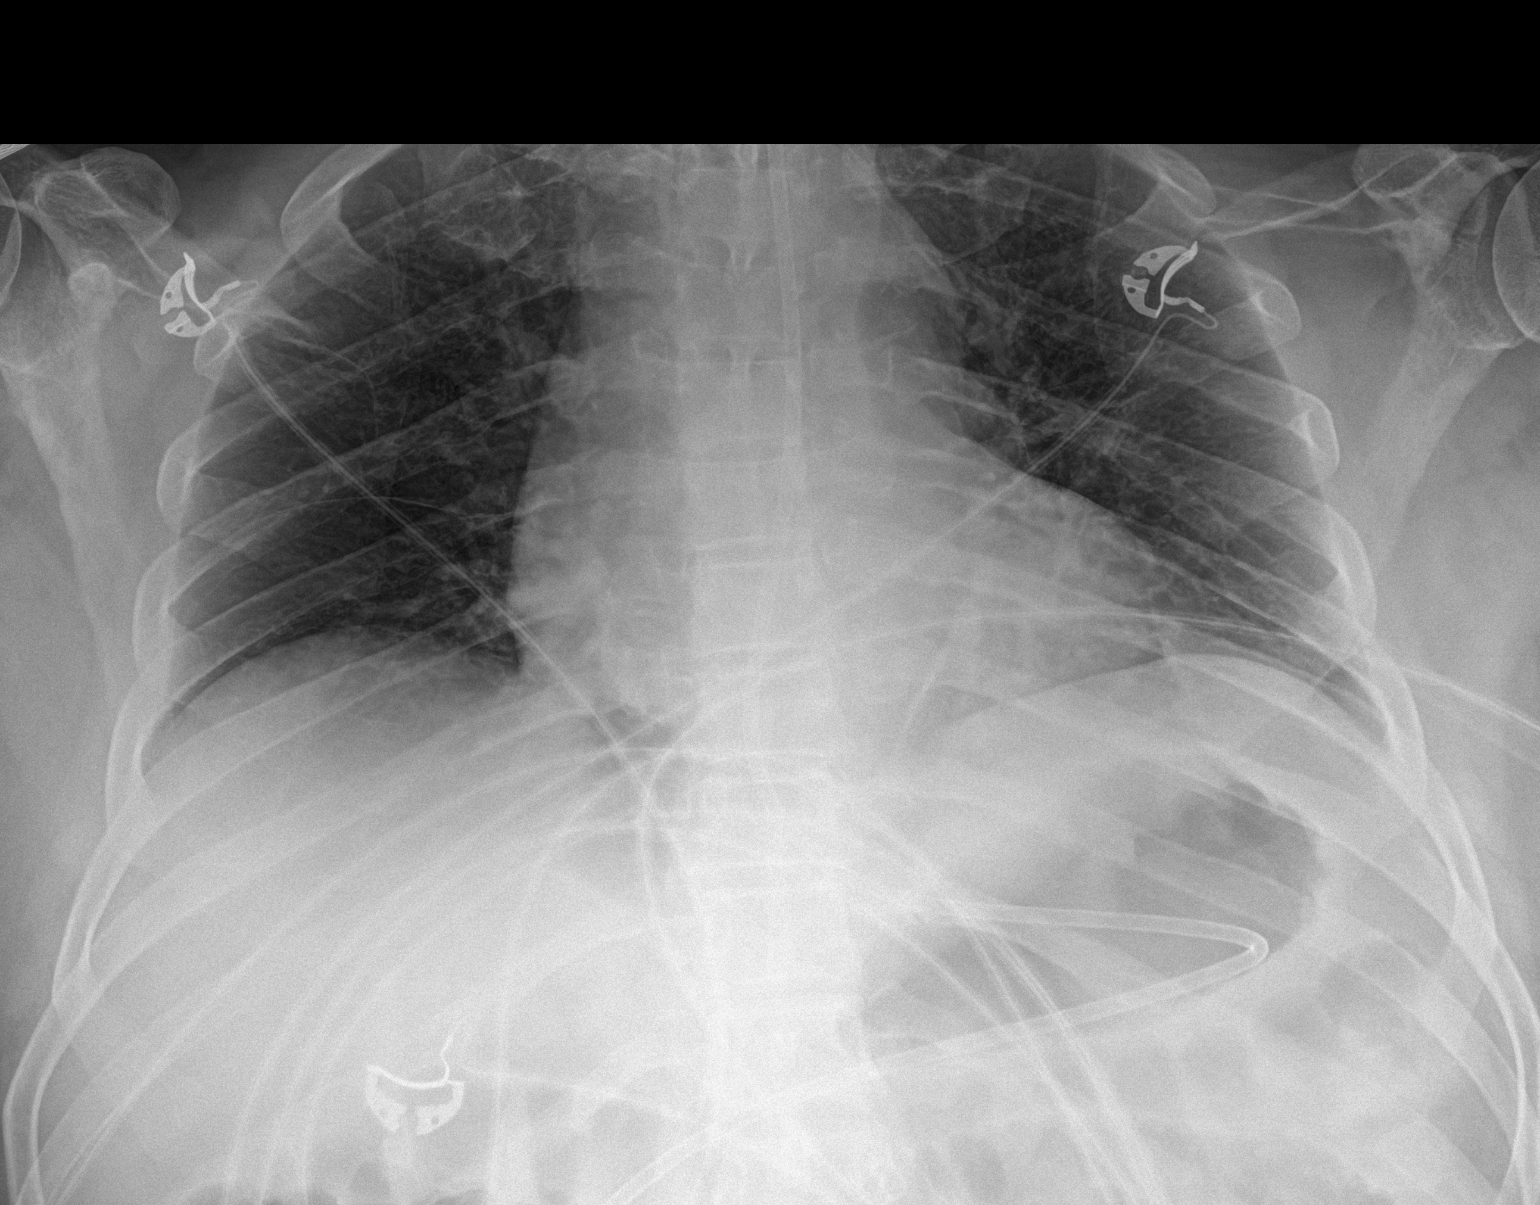

[1 of 1 positions shown; findings below may reference images not displayed]

FINDINGS: A feeding tube is seen extending into the abdomen, however, the tip
of the feeding tube extends below the lower margin of the image.
Linear scarring or subsegmental atelectasis in the right mid lung.
Lung volumes are low. No consolidative airspace disease. No pleural
effusions. No pneumothorax. No pulmonary nodule or mass noted.
Pulmonary vasculature and the cardiomediastinal silhouette are
within normal limits.
IMPRESSION: Low lung volumes without radiographic evidence of acute
cardiopulmonary disease.

## 2021-12-09 IMAGING — DX DG ABDOMEN 1V
1 series · 1 of 1 positions shown · non-contrast
Comparison: 04/15/2020

CLINICAL DATA: Right-sided abdominal pain

EXAM:
ABDOMEN - 1 VIEW

[abdomen]
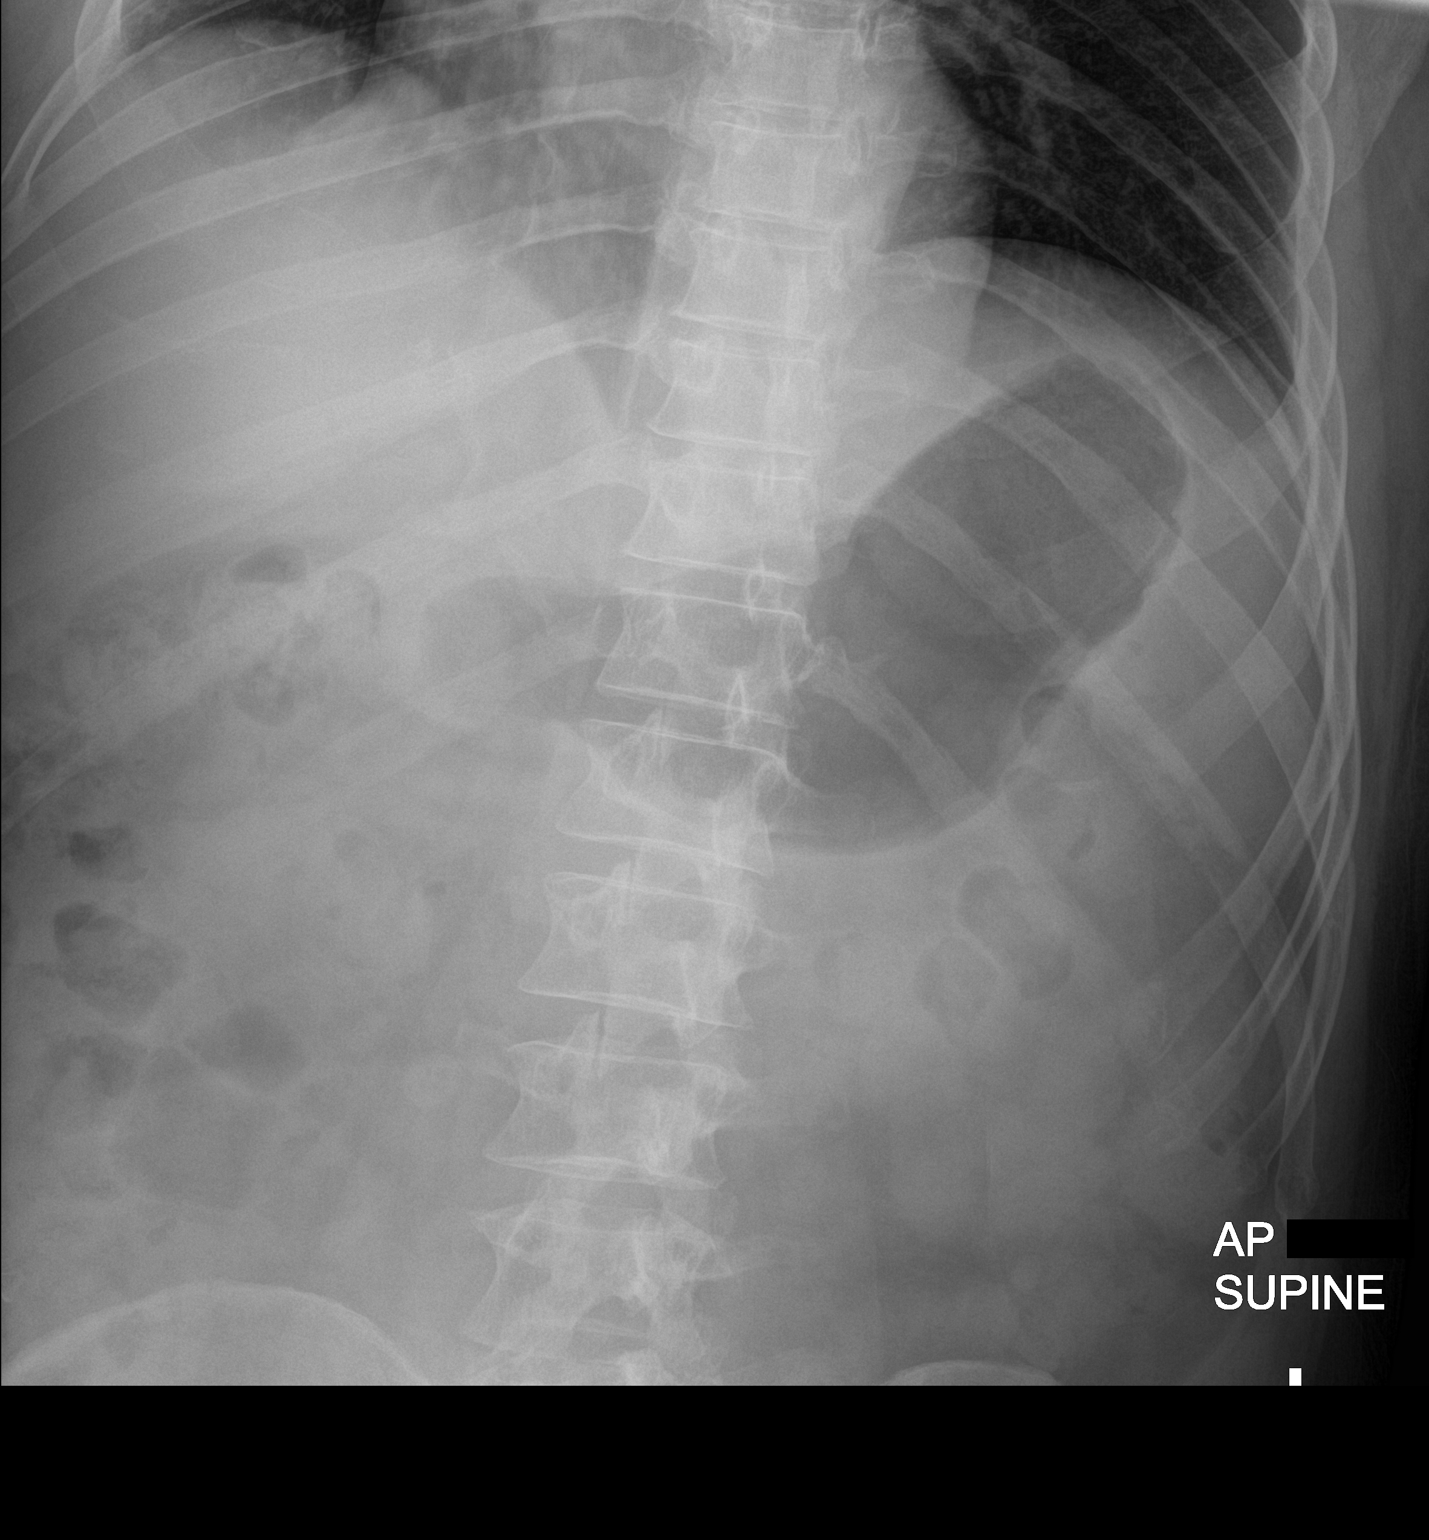

[1 of 1 positions shown; findings below may reference images not displayed]

FINDINGS: Feeding catheter is been removed in the interval. Scattered large
and small bowel gas is noted. No obstructive changes are seen. No
free air is noted. No bony abnormality is seen.
IMPRESSION: No acute abnormality noted.

## 2021-12-11 IMAGING — US US ABDOMEN LIMITED
1 series · 14 of 25 positions shown · non-contrast
Comparison: None.

CLINICAL DATA: Abdominal pain.

EXAM:
ULTRASOUND ABDOMEN LIMITED RIGHT UPPER QUADRANT

[Series 1: us abdomen limited ruq · 14 of 27 slices shown]
[im 1/27]
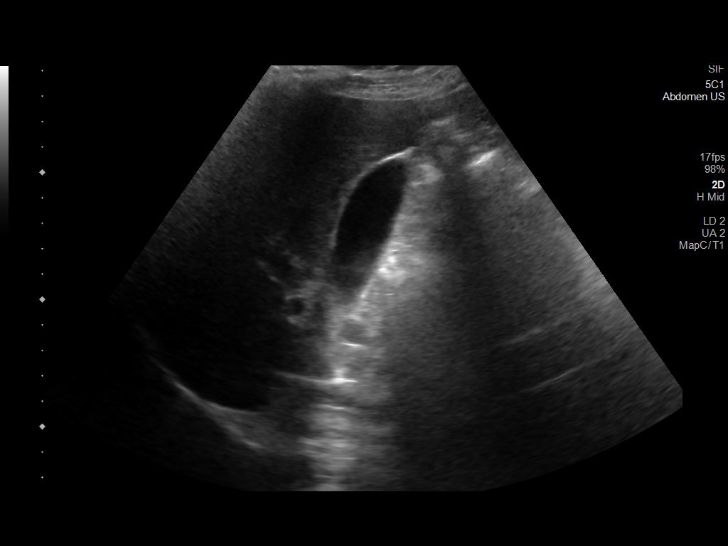
[im 3/27]
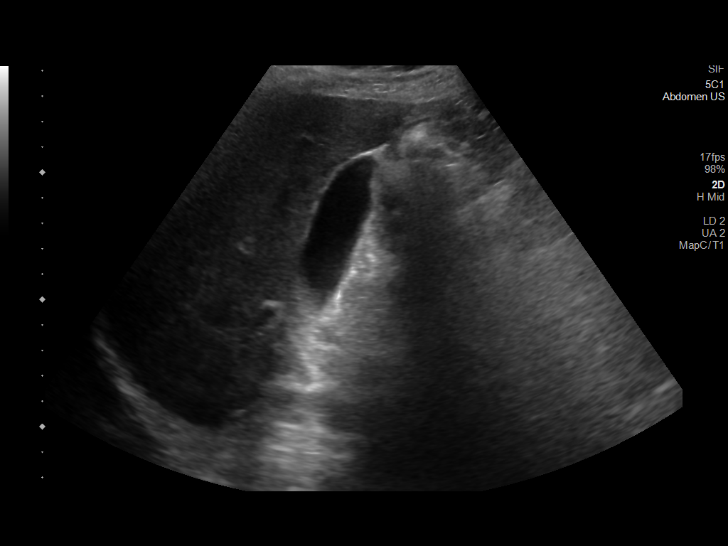
[im 5/27]
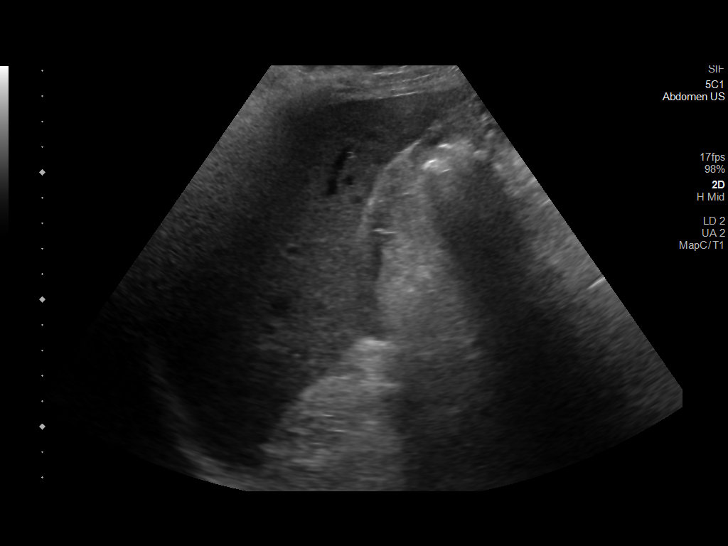
[im 7/27]
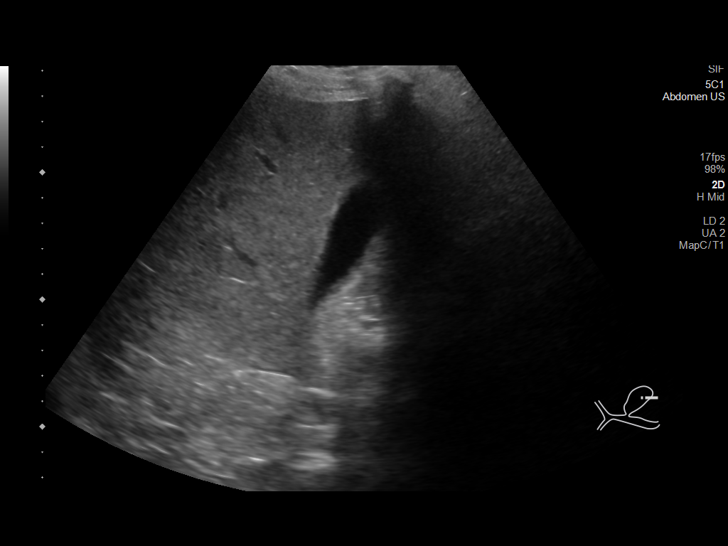
[im 9/27]
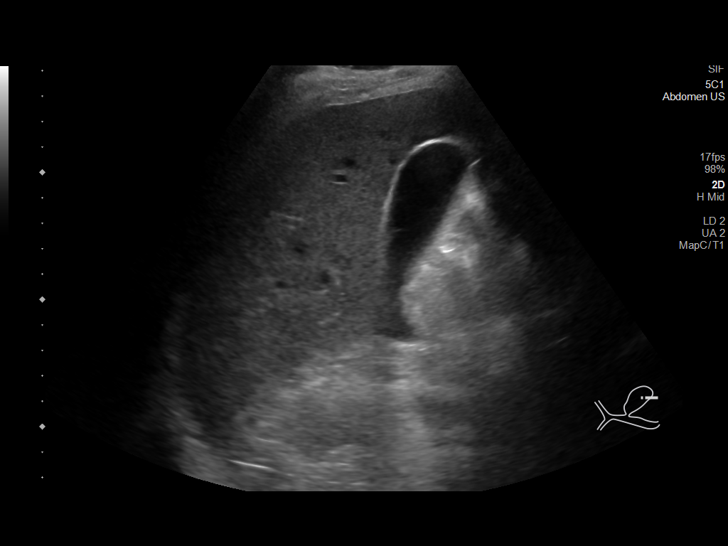
[im 10/27]
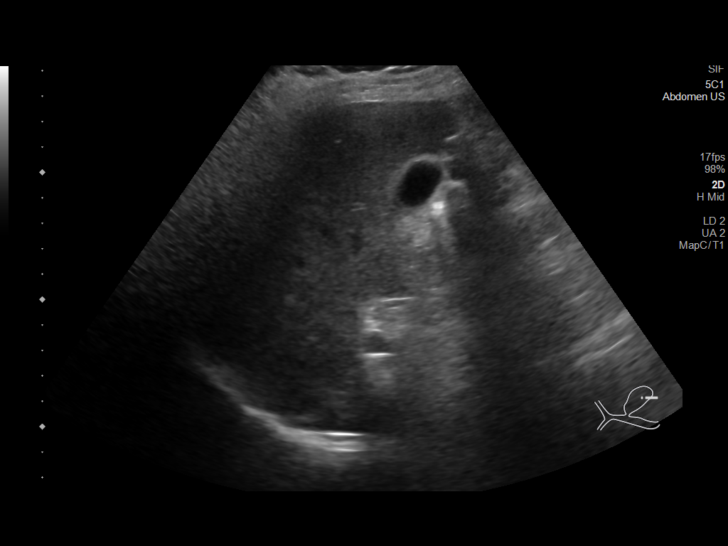
[im 12/27]
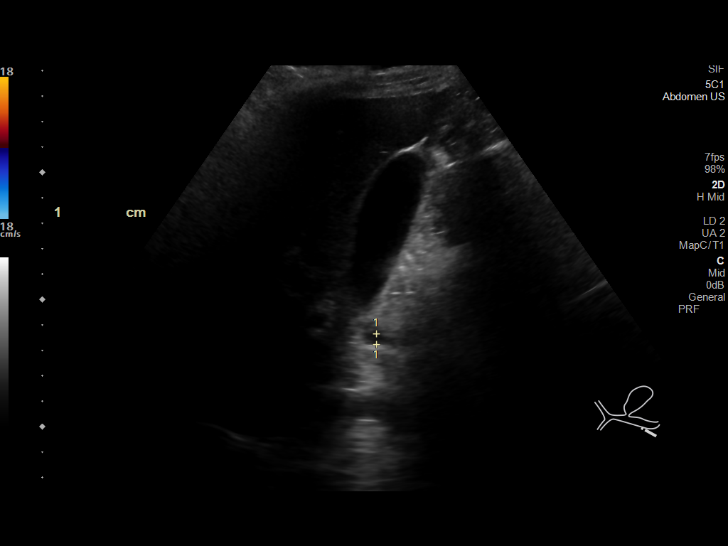
[im 15/27]
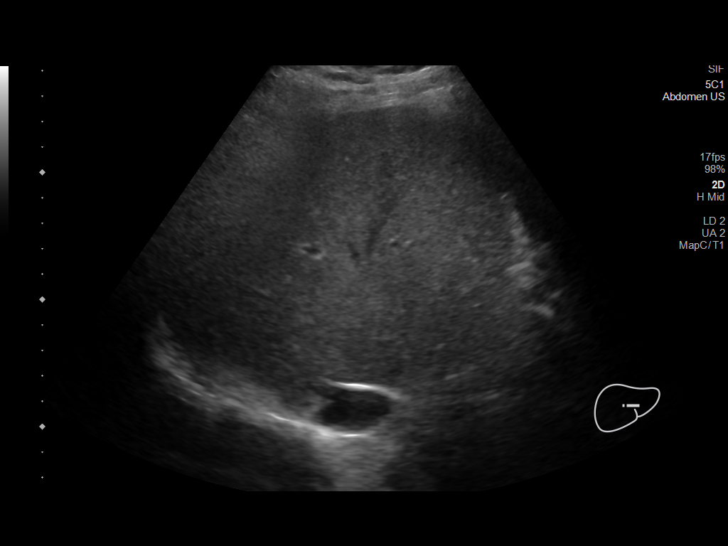
[im 17/27]
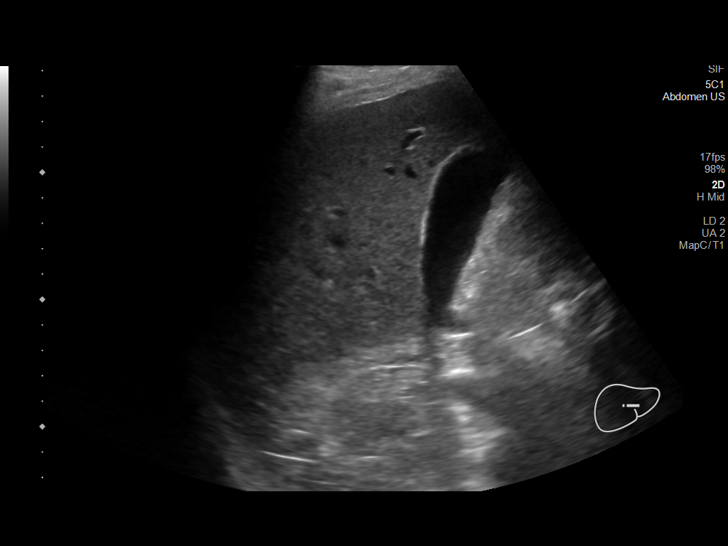
[im 18/27]
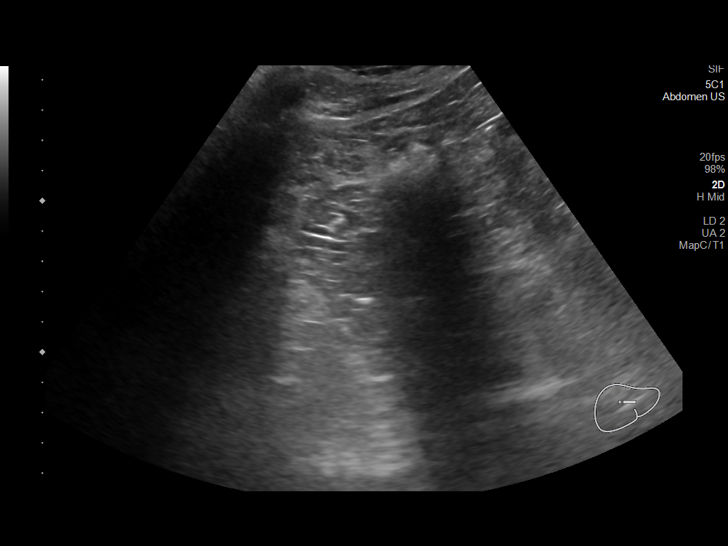
[im 20/27]
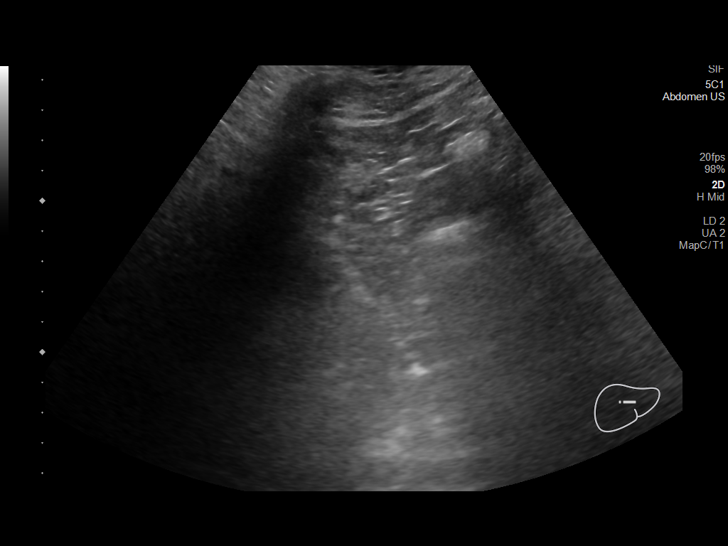
[im 22/27]
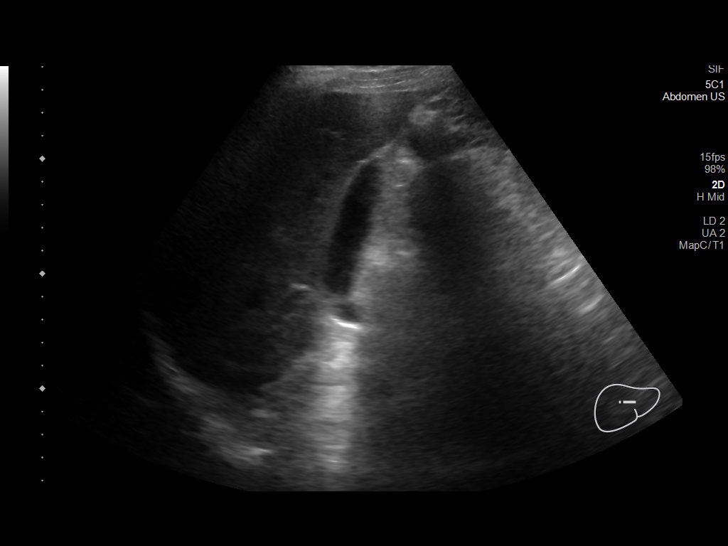
[im 24/27]
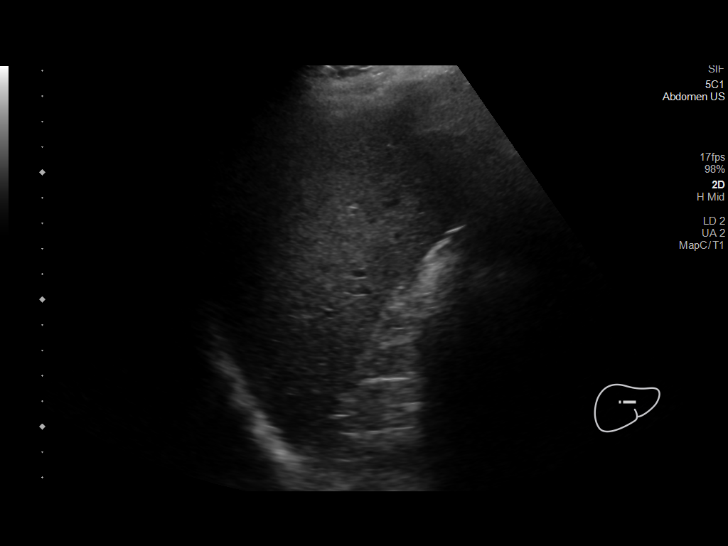
[im 27/27]
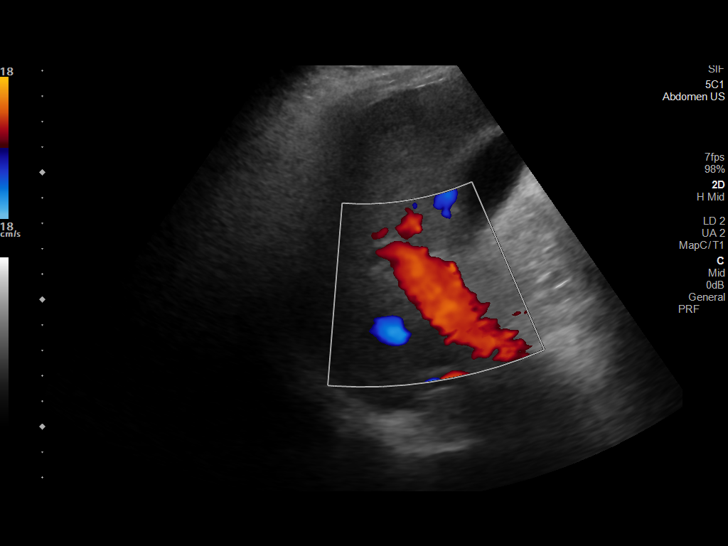

[14 of 25 positions shown; findings below may reference images not displayed]

FINDINGS: Gallbladder:

No gallstones or wall thickening visualized (2.5 mm). No sonographic
Murphy sign noted by sonographer.

Common bile duct:

Diameter: 4.2 mm

Liver:

No focal lesion identified. There is diffusely increased
echogenicity of the liver parenchyma. Portal vein is patent on color
Doppler imaging with normal direction of blood flow towards the
liver.

Other: None.
IMPRESSION: Fatty liver.

## 2021-12-17 IMAGING — DX DG SHOULDER 2+V*R*
2 series · 2 of 2 positions shown · non-contrast
Comparison: None.

CLINICAL DATA: Right shoulder pain and weakness. CVA.

EXAM:
RIGHT SHOULDER - 2+ VIEW

[x shoulder ap right]
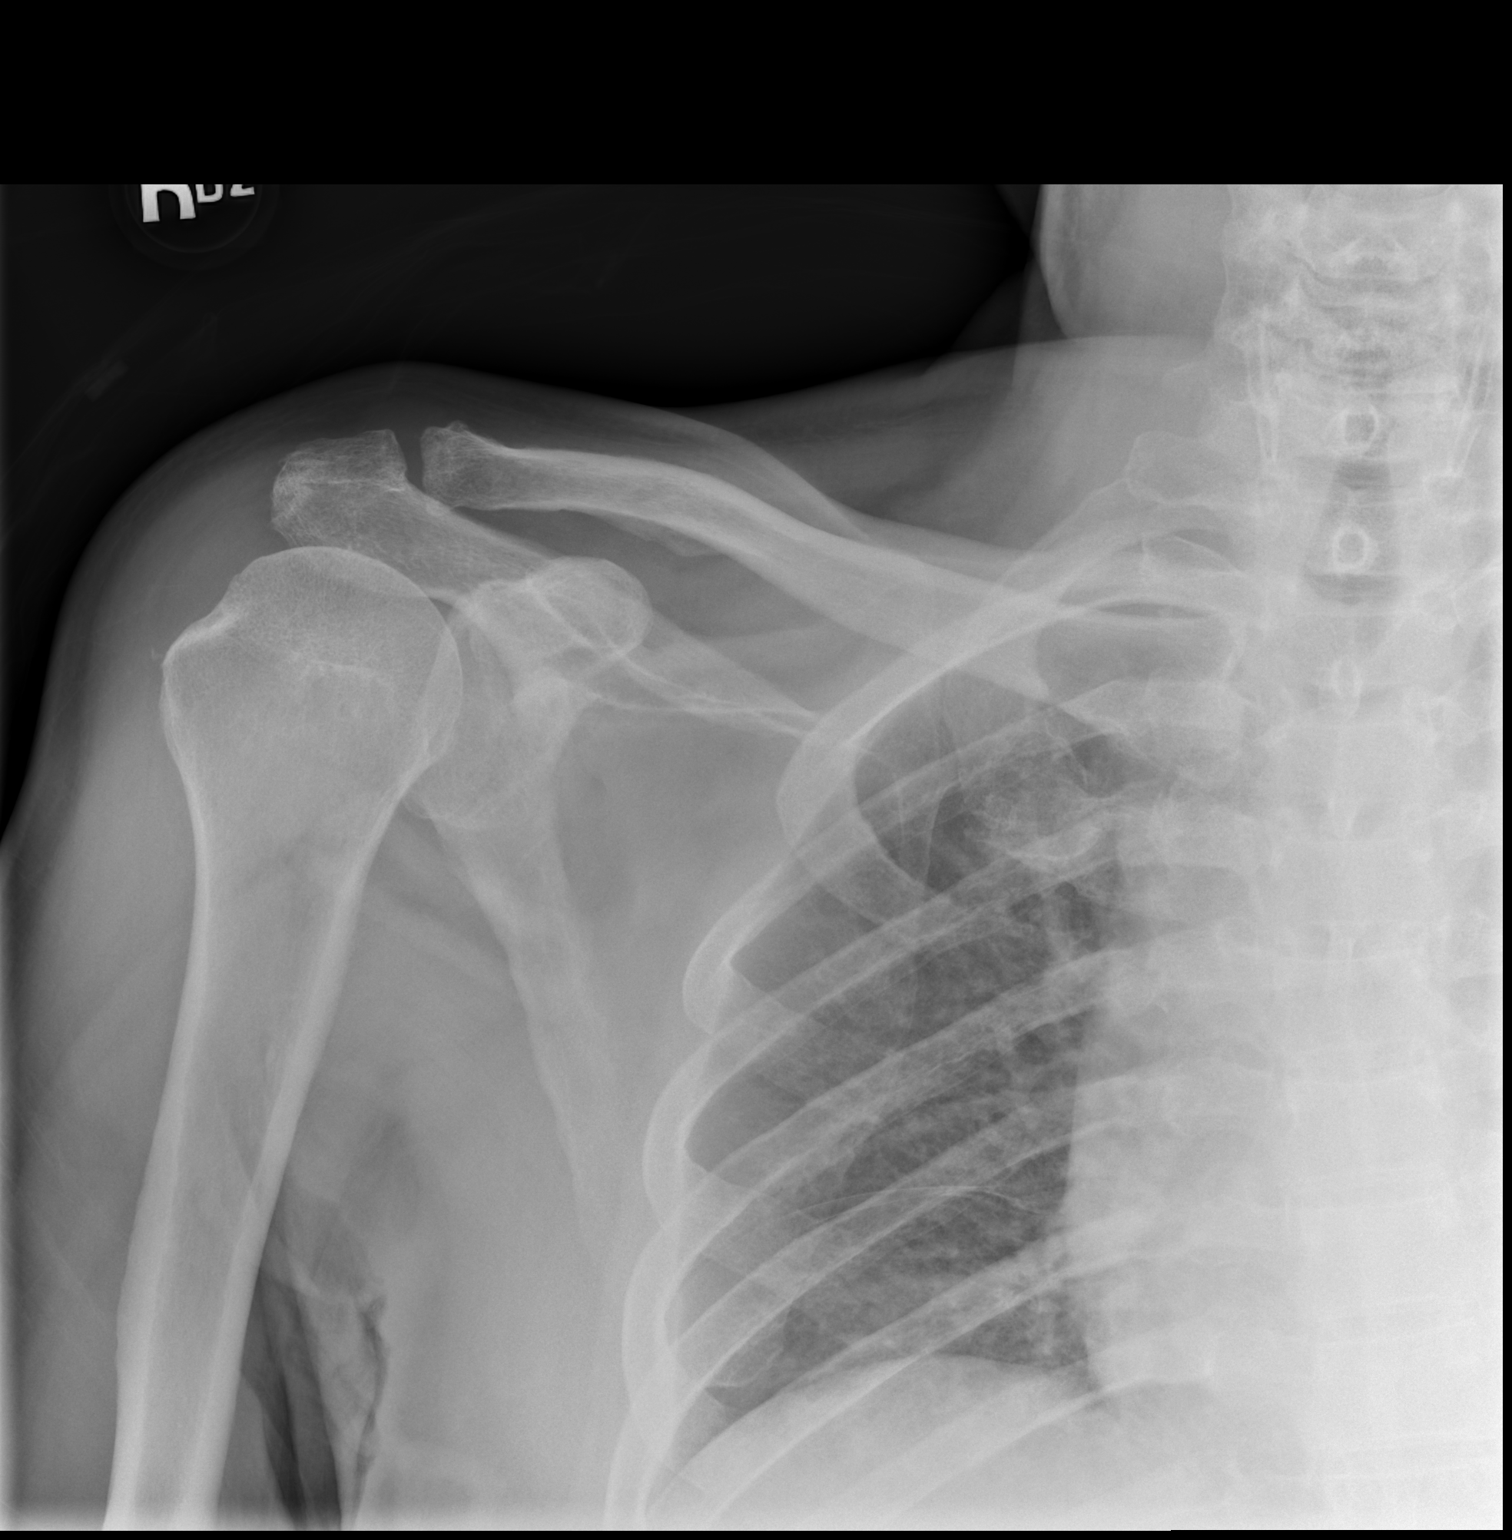

[x scapula y-view right]
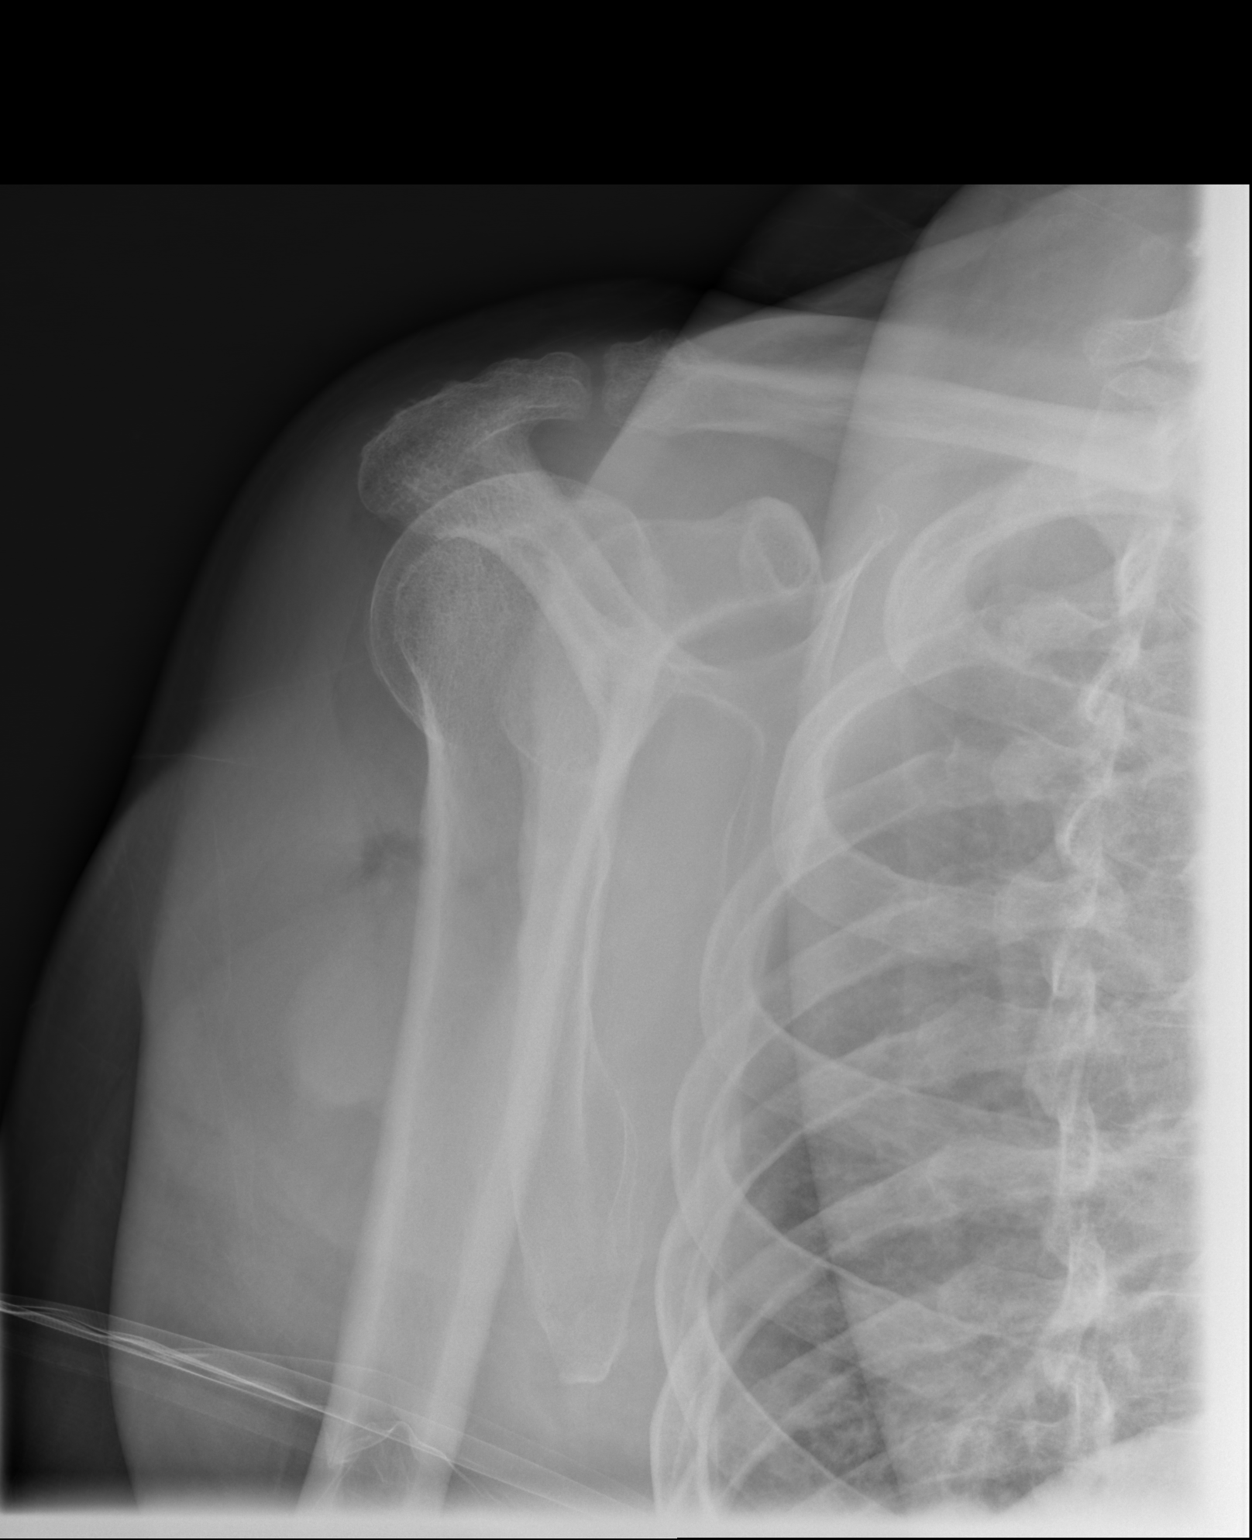

[2 of 2 positions shown; findings below may reference images not displayed]

FINDINGS: AP and Y-views are submitted. The mineralization and alignment are
normal. There is no evidence of acute fracture or dislocation. The
subacromial space is preserved. There are mild acromioclavicular
degenerative changes.
IMPRESSION: No acute osseous findings. Mild acromioclavicular degenerative
changes.

## 2022-09-15 DIAGNOSIS — Z0001 Encounter for general adult medical examination with abnormal findings: Secondary | ICD-10-CM | POA: Diagnosis not present

## 2022-10-20 ENCOUNTER — Ambulatory Visit (HOSPITAL_COMMUNITY): Payer: Self-pay | Admitting: Physical Therapy

## 2023-01-18 DIAGNOSIS — E782 Mixed hyperlipidemia: Secondary | ICD-10-CM | POA: Diagnosis not present

## 2023-01-18 DIAGNOSIS — E118 Type 2 diabetes mellitus with unspecified complications: Secondary | ICD-10-CM | POA: Diagnosis not present

## 2023-01-24 DIAGNOSIS — E113399 Type 2 diabetes mellitus with moderate nonproliferative diabetic retinopathy without macular edema, unspecified eye: Secondary | ICD-10-CM | POA: Diagnosis not present

## 2023-01-24 DIAGNOSIS — Z713 Dietary counseling and surveillance: Secondary | ICD-10-CM | POA: Diagnosis not present

## 2023-01-24 DIAGNOSIS — R809 Proteinuria, unspecified: Secondary | ICD-10-CM | POA: Diagnosis not present

## 2023-01-24 DIAGNOSIS — I10 Essential (primary) hypertension: Secondary | ICD-10-CM | POA: Diagnosis not present

## 2023-01-24 DIAGNOSIS — I69351 Hemiplegia and hemiparesis following cerebral infarction affecting right dominant side: Secondary | ICD-10-CM | POA: Diagnosis not present

## 2023-01-24 DIAGNOSIS — R059 Cough, unspecified: Secondary | ICD-10-CM | POA: Diagnosis not present

## 2023-01-24 DIAGNOSIS — E118 Type 2 diabetes mellitus with unspecified complications: Secondary | ICD-10-CM | POA: Diagnosis not present

## 2023-01-24 DIAGNOSIS — E669 Obesity, unspecified: Secondary | ICD-10-CM | POA: Diagnosis not present

## 2023-01-24 DIAGNOSIS — D696 Thrombocytopenia, unspecified: Secondary | ICD-10-CM | POA: Diagnosis not present

## 2023-01-24 DIAGNOSIS — Z6830 Body mass index (BMI) 30.0-30.9, adult: Secondary | ICD-10-CM | POA: Diagnosis not present

## 2023-01-24 DIAGNOSIS — E782 Mixed hyperlipidemia: Secondary | ICD-10-CM | POA: Diagnosis not present

## 2023-01-24 DIAGNOSIS — I69321 Dysphasia following cerebral infarction: Secondary | ICD-10-CM | POA: Diagnosis not present

## 2023-01-25 ENCOUNTER — Telehealth: Payer: Self-pay

## 2023-01-25 NOTE — Telephone Encounter (Signed)
Mychart msg sent

## 2023-02-14 DIAGNOSIS — Z1211 Encounter for screening for malignant neoplasm of colon: Secondary | ICD-10-CM | POA: Diagnosis not present

## 2023-05-02 DIAGNOSIS — Z125 Encounter for screening for malignant neoplasm of prostate: Secondary | ICD-10-CM | POA: Diagnosis not present

## 2023-05-02 DIAGNOSIS — E118 Type 2 diabetes mellitus with unspecified complications: Secondary | ICD-10-CM | POA: Diagnosis not present

## 2023-05-02 DIAGNOSIS — E782 Mixed hyperlipidemia: Secondary | ICD-10-CM | POA: Diagnosis not present

## 2023-05-05 DIAGNOSIS — E669 Obesity, unspecified: Secondary | ICD-10-CM | POA: Diagnosis not present

## 2023-05-05 DIAGNOSIS — I69351 Hemiplegia and hemiparesis following cerebral infarction affecting right dominant side: Secondary | ICD-10-CM | POA: Diagnosis not present

## 2023-05-05 DIAGNOSIS — E782 Mixed hyperlipidemia: Secondary | ICD-10-CM | POA: Diagnosis not present

## 2023-05-05 DIAGNOSIS — E113399 Type 2 diabetes mellitus with moderate nonproliferative diabetic retinopathy without macular edema, unspecified eye: Secondary | ICD-10-CM | POA: Diagnosis not present

## 2023-05-05 DIAGNOSIS — I10 Essential (primary) hypertension: Secondary | ICD-10-CM | POA: Diagnosis not present

## 2023-05-05 DIAGNOSIS — R945 Abnormal results of liver function studies: Secondary | ICD-10-CM | POA: Diagnosis not present

## 2023-05-05 DIAGNOSIS — E119 Type 2 diabetes mellitus without complications: Secondary | ICD-10-CM | POA: Diagnosis not present

## 2023-05-05 DIAGNOSIS — D696 Thrombocytopenia, unspecified: Secondary | ICD-10-CM | POA: Diagnosis not present

## 2023-05-05 DIAGNOSIS — I69321 Dysphasia following cerebral infarction: Secondary | ICD-10-CM | POA: Diagnosis not present

## 2023-05-05 DIAGNOSIS — E118 Type 2 diabetes mellitus with unspecified complications: Secondary | ICD-10-CM | POA: Diagnosis not present

## 2023-05-05 DIAGNOSIS — R809 Proteinuria, unspecified: Secondary | ICD-10-CM | POA: Diagnosis not present

## 2023-08-24 DIAGNOSIS — E118 Type 2 diabetes mellitus with unspecified complications: Secondary | ICD-10-CM | POA: Diagnosis not present

## 2023-08-24 DIAGNOSIS — E782 Mixed hyperlipidemia: Secondary | ICD-10-CM | POA: Diagnosis not present

## 2023-08-30 DIAGNOSIS — I69351 Hemiplegia and hemiparesis following cerebral infarction affecting right dominant side: Secondary | ICD-10-CM | POA: Diagnosis not present

## 2023-08-30 DIAGNOSIS — E669 Obesity, unspecified: Secondary | ICD-10-CM | POA: Diagnosis not present

## 2023-08-30 DIAGNOSIS — I10 Essential (primary) hypertension: Secondary | ICD-10-CM | POA: Diagnosis not present

## 2023-08-30 DIAGNOSIS — I69321 Dysphasia following cerebral infarction: Secondary | ICD-10-CM | POA: Diagnosis not present

## 2023-08-30 DIAGNOSIS — E113399 Type 2 diabetes mellitus with moderate nonproliferative diabetic retinopathy without macular edema, unspecified eye: Secondary | ICD-10-CM | POA: Diagnosis not present

## 2023-08-30 DIAGNOSIS — Z2821 Immunization not carried out because of patient refusal: Secondary | ICD-10-CM | POA: Diagnosis not present

## 2023-08-30 DIAGNOSIS — R945 Abnormal results of liver function studies: Secondary | ICD-10-CM | POA: Diagnosis not present

## 2023-08-30 DIAGNOSIS — E118 Type 2 diabetes mellitus with unspecified complications: Secondary | ICD-10-CM | POA: Diagnosis not present

## 2023-08-30 DIAGNOSIS — R809 Proteinuria, unspecified: Secondary | ICD-10-CM | POA: Diagnosis not present

## 2023-08-30 DIAGNOSIS — D696 Thrombocytopenia, unspecified: Secondary | ICD-10-CM | POA: Diagnosis not present

## 2023-08-30 DIAGNOSIS — E782 Mixed hyperlipidemia: Secondary | ICD-10-CM | POA: Diagnosis not present

## 2023-12-05 DIAGNOSIS — E118 Type 2 diabetes mellitus with unspecified complications: Secondary | ICD-10-CM | POA: Diagnosis not present

## 2023-12-05 DIAGNOSIS — E782 Mixed hyperlipidemia: Secondary | ICD-10-CM | POA: Diagnosis not present

## 2023-12-12 DIAGNOSIS — E113399 Type 2 diabetes mellitus with moderate nonproliferative diabetic retinopathy without macular edema, unspecified eye: Secondary | ICD-10-CM | POA: Diagnosis not present

## 2023-12-12 DIAGNOSIS — R809 Proteinuria, unspecified: Secondary | ICD-10-CM | POA: Diagnosis not present

## 2023-12-12 DIAGNOSIS — D696 Thrombocytopenia, unspecified: Secondary | ICD-10-CM | POA: Diagnosis not present

## 2023-12-12 DIAGNOSIS — I69351 Hemiplegia and hemiparesis following cerebral infarction affecting right dominant side: Secondary | ICD-10-CM | POA: Diagnosis not present

## 2023-12-12 DIAGNOSIS — E118 Type 2 diabetes mellitus with unspecified complications: Secondary | ICD-10-CM | POA: Diagnosis not present

## 2023-12-12 DIAGNOSIS — I10 Essential (primary) hypertension: Secondary | ICD-10-CM | POA: Diagnosis not present

## 2023-12-12 DIAGNOSIS — E669 Obesity, unspecified: Secondary | ICD-10-CM | POA: Diagnosis not present

## 2023-12-12 DIAGNOSIS — I69321 Dysphasia following cerebral infarction: Secondary | ICD-10-CM | POA: Diagnosis not present

## 2023-12-12 DIAGNOSIS — E782 Mixed hyperlipidemia: Secondary | ICD-10-CM | POA: Diagnosis not present

## 2023-12-12 DIAGNOSIS — R945 Abnormal results of liver function studies: Secondary | ICD-10-CM | POA: Diagnosis not present

## 2023-12-12 DIAGNOSIS — E113299 Type 2 diabetes mellitus with mild nonproliferative diabetic retinopathy without macular edema, unspecified eye: Secondary | ICD-10-CM | POA: Diagnosis not present

## 2024-04-11 DIAGNOSIS — E669 Obesity, unspecified: Secondary | ICD-10-CM | POA: Diagnosis not present

## 2024-04-11 DIAGNOSIS — I69351 Hemiplegia and hemiparesis following cerebral infarction affecting right dominant side: Secondary | ICD-10-CM | POA: Diagnosis not present

## 2024-04-11 DIAGNOSIS — I10 Essential (primary) hypertension: Secondary | ICD-10-CM | POA: Diagnosis not present

## 2024-04-11 DIAGNOSIS — E113399 Type 2 diabetes mellitus with moderate nonproliferative diabetic retinopathy without macular edema, unspecified eye: Secondary | ICD-10-CM | POA: Diagnosis not present

## 2024-04-11 DIAGNOSIS — R945 Abnormal results of liver function studies: Secondary | ICD-10-CM | POA: Diagnosis not present

## 2024-04-11 DIAGNOSIS — E782 Mixed hyperlipidemia: Secondary | ICD-10-CM | POA: Diagnosis not present

## 2024-04-11 DIAGNOSIS — E118 Type 2 diabetes mellitus with unspecified complications: Secondary | ICD-10-CM | POA: Diagnosis not present

## 2024-04-11 DIAGNOSIS — F419 Anxiety disorder, unspecified: Secondary | ICD-10-CM | POA: Diagnosis not present

## 2024-04-11 DIAGNOSIS — D696 Thrombocytopenia, unspecified: Secondary | ICD-10-CM | POA: Diagnosis not present

## 2024-04-11 DIAGNOSIS — R809 Proteinuria, unspecified: Secondary | ICD-10-CM | POA: Diagnosis not present

## 2024-04-11 DIAGNOSIS — I69321 Dysphasia following cerebral infarction: Secondary | ICD-10-CM | POA: Diagnosis not present

## 2024-08-12 DIAGNOSIS — E782 Mixed hyperlipidemia: Secondary | ICD-10-CM | POA: Diagnosis not present

## 2024-08-12 DIAGNOSIS — E118 Type 2 diabetes mellitus with unspecified complications: Secondary | ICD-10-CM | POA: Diagnosis not present

## 2024-08-16 DIAGNOSIS — I10 Essential (primary) hypertension: Secondary | ICD-10-CM | POA: Diagnosis not present

## 2024-08-16 DIAGNOSIS — D696 Thrombocytopenia, unspecified: Secondary | ICD-10-CM | POA: Diagnosis not present

## 2024-08-16 DIAGNOSIS — I69351 Hemiplegia and hemiparesis following cerebral infarction affecting right dominant side: Secondary | ICD-10-CM | POA: Diagnosis not present

## 2024-08-16 DIAGNOSIS — E782 Mixed hyperlipidemia: Secondary | ICD-10-CM | POA: Diagnosis not present

## 2024-08-16 DIAGNOSIS — E118 Type 2 diabetes mellitus with unspecified complications: Secondary | ICD-10-CM | POA: Diagnosis not present

## 2024-08-16 DIAGNOSIS — Z2820 Immunization not carried out because of patient decision for unspecified reason: Secondary | ICD-10-CM | POA: Diagnosis not present

## 2024-08-16 DIAGNOSIS — E669 Obesity, unspecified: Secondary | ICD-10-CM | POA: Diagnosis not present

## 2024-08-16 DIAGNOSIS — E113399 Type 2 diabetes mellitus with moderate nonproliferative diabetic retinopathy without macular edema, unspecified eye: Secondary | ICD-10-CM | POA: Diagnosis not present

## 2024-08-16 DIAGNOSIS — I69321 Dysphasia following cerebral infarction: Secondary | ICD-10-CM | POA: Diagnosis not present

## 2024-08-16 DIAGNOSIS — R809 Proteinuria, unspecified: Secondary | ICD-10-CM | POA: Diagnosis not present

## 2024-08-16 DIAGNOSIS — R945 Abnormal results of liver function studies: Secondary | ICD-10-CM | POA: Diagnosis not present

## 2024-09-13 DIAGNOSIS — L600 Ingrowing nail: Secondary | ICD-10-CM | POA: Diagnosis not present

## 2024-09-13 DIAGNOSIS — L03032 Cellulitis of left toe: Secondary | ICD-10-CM | POA: Diagnosis not present

## 2024-09-30 DIAGNOSIS — E118 Type 2 diabetes mellitus with unspecified complications: Secondary | ICD-10-CM | POA: Diagnosis not present

## 2024-09-30 DIAGNOSIS — Z713 Dietary counseling and surveillance: Secondary | ICD-10-CM | POA: Diagnosis not present

## 2024-09-30 DIAGNOSIS — Z794 Long term (current) use of insulin: Secondary | ICD-10-CM | POA: Diagnosis not present

## 2024-09-30 DIAGNOSIS — E669 Obesity, unspecified: Secondary | ICD-10-CM | POA: Diagnosis not present

## 2024-09-30 DIAGNOSIS — I10 Essential (primary) hypertension: Secondary | ICD-10-CM | POA: Diagnosis not present

## 2024-09-30 DIAGNOSIS — N390 Urinary tract infection, site not specified: Secondary | ICD-10-CM | POA: Diagnosis not present

## 2024-09-30 DIAGNOSIS — R051 Acute cough: Secondary | ICD-10-CM | POA: Diagnosis not present

## 2024-09-30 DIAGNOSIS — Z7984 Long term (current) use of oral hypoglycemic drugs: Secondary | ICD-10-CM | POA: Diagnosis not present

## 2024-09-30 DIAGNOSIS — Z7182 Exercise counseling: Secondary | ICD-10-CM | POA: Diagnosis not present

## 2024-09-30 DIAGNOSIS — Z79899 Other long term (current) drug therapy: Secondary | ICD-10-CM | POA: Diagnosis not present

## 2024-12-02 DIAGNOSIS — E782 Mixed hyperlipidemia: Secondary | ICD-10-CM | POA: Diagnosis not present
